# Patient Record
Sex: Male | Born: 1945 | Race: White | Hispanic: No | Smoking: Former smoker
Health system: Southern US, Community
[De-identification: ages and names within clinical notes are randomized; demographics above are authoritative.]

## PROBLEM LIST (undated history)

## (undated) DIAGNOSIS — R079 Chest pain, unspecified: Secondary | ICD-10-CM

## (undated) DIAGNOSIS — K219 Gastro-esophageal reflux disease without esophagitis: Secondary | ICD-10-CM

## (undated) DIAGNOSIS — E785 Hyperlipidemia, unspecified: Secondary | ICD-10-CM

## (undated) DIAGNOSIS — J45909 Unspecified asthma, uncomplicated: Secondary | ICD-10-CM

## (undated) DIAGNOSIS — F32A Depression, unspecified: Secondary | ICD-10-CM

## (undated) DIAGNOSIS — R0602 Shortness of breath: Secondary | ICD-10-CM

## (undated) DIAGNOSIS — R011 Cardiac murmur, unspecified: Secondary | ICD-10-CM

## (undated) DIAGNOSIS — N289 Disorder of kidney and ureter, unspecified: Secondary | ICD-10-CM

## (undated) DIAGNOSIS — I739 Peripheral vascular disease, unspecified: Secondary | ICD-10-CM

## (undated) DIAGNOSIS — I10 Essential (primary) hypertension: Secondary | ICD-10-CM

## (undated) DIAGNOSIS — R609 Edema, unspecified: Secondary | ICD-10-CM

## (undated) DIAGNOSIS — F419 Anxiety disorder, unspecified: Secondary | ICD-10-CM

## (undated) DIAGNOSIS — I499 Cardiac arrhythmia, unspecified: Secondary | ICD-10-CM

## (undated) DIAGNOSIS — I639 Cerebral infarction, unspecified: Secondary | ICD-10-CM

## (undated) DIAGNOSIS — G4730 Sleep apnea, unspecified: Secondary | ICD-10-CM

## (undated) DIAGNOSIS — G629 Polyneuropathy, unspecified: Secondary | ICD-10-CM

## (undated) DIAGNOSIS — I4891 Unspecified atrial fibrillation: Secondary | ICD-10-CM

## (undated) DIAGNOSIS — I509 Heart failure, unspecified: Secondary | ICD-10-CM

## (undated) DIAGNOSIS — R0600 Dyspnea, unspecified: Secondary | ICD-10-CM

## (undated) DIAGNOSIS — F329 Major depressive disorder, single episode, unspecified: Secondary | ICD-10-CM

## (undated) DIAGNOSIS — E669 Obesity, unspecified: Secondary | ICD-10-CM

## (undated) DIAGNOSIS — E039 Hypothyroidism, unspecified: Secondary | ICD-10-CM

## (undated) HISTORY — DX: Chest pain, unspecified: R07.9

## (undated) HISTORY — DX: Hyperlipidemia, unspecified: E78.5

## (undated) HISTORY — DX: Major depressive disorder, single episode, unspecified: F32.9

## (undated) HISTORY — DX: Edema, unspecified: R60.9

## (undated) HISTORY — PX: BACK SURGERY: SHX140

## (undated) HISTORY — PX: LUMBAR FUSION: SHX111

## (undated) HISTORY — PX: EYE SURGERY: SHX253

## (undated) HISTORY — DX: Polyneuropathy, unspecified: G62.9

## (undated) HISTORY — DX: Hypothyroidism, unspecified: E03.9

## (undated) HISTORY — PX: CARDIAC CATHETERIZATION: SHX172

## (undated) HISTORY — DX: Essential (primary) hypertension: I10

## (undated) HISTORY — DX: Depression, unspecified: F32.A

## (undated) HISTORY — DX: Shortness of breath: R06.02

---

## 1997-12-29 ENCOUNTER — Emergency Department (HOSPITAL_COMMUNITY): Admission: EM | Admit: 1997-12-29 | Discharge: 1997-12-29 | Payer: Self-pay | Admitting: Emergency Medicine

## 1998-01-01 ENCOUNTER — Encounter: Admission: RE | Admit: 1998-01-01 | Discharge: 1998-04-01 | Payer: Self-pay | Admitting: Internal Medicine

## 1998-01-03 ENCOUNTER — Encounter: Payer: Self-pay | Admitting: Internal Medicine

## 1998-01-24 ENCOUNTER — Encounter: Payer: Self-pay | Admitting: *Deleted

## 1998-01-24 ENCOUNTER — Ambulatory Visit (HOSPITAL_COMMUNITY): Admission: RE | Admit: 1998-01-24 | Discharge: 1998-01-24 | Payer: Self-pay | Admitting: *Deleted

## 1998-02-07 ENCOUNTER — Encounter: Payer: Self-pay | Admitting: *Deleted

## 1998-02-07 ENCOUNTER — Ambulatory Visit (HOSPITAL_COMMUNITY): Admission: RE | Admit: 1998-02-07 | Discharge: 1998-02-07 | Payer: Self-pay | Admitting: *Deleted

## 1998-02-22 ENCOUNTER — Encounter: Payer: Self-pay | Admitting: *Deleted

## 1998-02-22 ENCOUNTER — Ambulatory Visit (HOSPITAL_COMMUNITY): Admission: RE | Admit: 1998-02-22 | Discharge: 1998-02-22 | Payer: Self-pay | Admitting: *Deleted

## 1999-01-03 ENCOUNTER — Observation Stay (HOSPITAL_COMMUNITY): Admission: EM | Admit: 1999-01-03 | Discharge: 1999-01-04 | Payer: Self-pay | Admitting: Emergency Medicine

## 1999-01-03 ENCOUNTER — Encounter: Payer: Self-pay | Admitting: Emergency Medicine

## 1999-01-21 ENCOUNTER — Encounter: Admission: RE | Admit: 1999-01-21 | Discharge: 1999-01-21 | Payer: Self-pay | Admitting: Family Medicine

## 1999-08-27 ENCOUNTER — Encounter: Payer: Self-pay | Admitting: Emergency Medicine

## 1999-08-27 ENCOUNTER — Emergency Department (HOSPITAL_COMMUNITY): Admission: EM | Admit: 1999-08-27 | Discharge: 1999-08-27 | Payer: Self-pay | Admitting: Emergency Medicine

## 1999-09-20 ENCOUNTER — Encounter: Payer: Self-pay | Admitting: Emergency Medicine

## 1999-09-20 ENCOUNTER — Inpatient Hospital Stay (HOSPITAL_COMMUNITY): Admission: EM | Admit: 1999-09-20 | Discharge: 1999-09-27 | Payer: Self-pay | Admitting: Emergency Medicine

## 1999-09-24 ENCOUNTER — Encounter: Payer: Self-pay | Admitting: Family Medicine

## 2001-06-15 ENCOUNTER — Encounter: Payer: Self-pay | Admitting: Family Medicine

## 2001-06-15 ENCOUNTER — Encounter: Admission: RE | Admit: 2001-06-15 | Discharge: 2001-06-15 | Payer: Self-pay | Admitting: Family Medicine

## 2001-08-09 ENCOUNTER — Ambulatory Visit (HOSPITAL_COMMUNITY): Admission: RE | Admit: 2001-08-09 | Discharge: 2001-08-09 | Payer: Self-pay | Admitting: Gastroenterology

## 2002-11-04 ENCOUNTER — Encounter: Admission: RE | Admit: 2002-11-04 | Discharge: 2002-11-04 | Payer: Self-pay | Admitting: Gastroenterology

## 2002-11-04 ENCOUNTER — Encounter: Payer: Self-pay | Admitting: Gastroenterology

## 2003-03-23 ENCOUNTER — Inpatient Hospital Stay (HOSPITAL_COMMUNITY): Admission: EM | Admit: 2003-03-23 | Discharge: 2003-03-31 | Payer: Self-pay | Admitting: Emergency Medicine

## 2003-04-03 ENCOUNTER — Inpatient Hospital Stay (HOSPITAL_COMMUNITY): Admission: EM | Admit: 2003-04-03 | Discharge: 2003-04-06 | Payer: Self-pay | Admitting: Emergency Medicine

## 2003-04-06 ENCOUNTER — Inpatient Hospital Stay (HOSPITAL_COMMUNITY)
Admission: RE | Admit: 2003-04-06 | Discharge: 2003-04-12 | Payer: Self-pay | Admitting: Physical Medicine & Rehabilitation

## 2003-05-23 ENCOUNTER — Encounter
Admission: RE | Admit: 2003-05-23 | Discharge: 2003-08-21 | Payer: Self-pay | Admitting: Physical Medicine & Rehabilitation

## 2003-06-23 ENCOUNTER — Inpatient Hospital Stay (HOSPITAL_COMMUNITY): Admission: EM | Admit: 2003-06-23 | Discharge: 2003-06-27 | Payer: Self-pay | Admitting: Emergency Medicine

## 2004-02-22 ENCOUNTER — Ambulatory Visit (HOSPITAL_COMMUNITY): Admission: RE | Admit: 2004-02-22 | Discharge: 2004-02-22 | Payer: Self-pay | Admitting: Neurology

## 2004-03-19 ENCOUNTER — Ambulatory Visit: Payer: Self-pay

## 2006-04-12 ENCOUNTER — Ambulatory Visit: Payer: Self-pay | Admitting: Internal Medicine

## 2006-04-12 ENCOUNTER — Inpatient Hospital Stay (HOSPITAL_COMMUNITY): Admission: EM | Admit: 2006-04-12 | Discharge: 2006-04-15 | Payer: Self-pay | Admitting: Emergency Medicine

## 2006-04-12 ENCOUNTER — Ambulatory Visit: Payer: Self-pay | Admitting: Family Medicine

## 2010-04-28 ENCOUNTER — Encounter: Payer: Self-pay | Admitting: Physical Medicine and Rehabilitation

## 2012-03-19 ENCOUNTER — Encounter: Payer: Self-pay | Admitting: Cardiology

## 2012-03-19 ENCOUNTER — Encounter: Payer: Self-pay | Admitting: *Deleted

## 2012-03-22 ENCOUNTER — Encounter: Payer: Self-pay | Admitting: Cardiovascular Disease

## 2012-03-22 ENCOUNTER — Ambulatory Visit (INDEPENDENT_AMBULATORY_CARE_PROVIDER_SITE_OTHER): Payer: Medicare Other | Admitting: Cardiovascular Disease

## 2012-03-22 VITALS — BP 163/76 | HR 72 | Resp 20 | Ht 74.0 in | Wt 367.0 lb

## 2012-03-22 DIAGNOSIS — I359 Nonrheumatic aortic valve disorder, unspecified: Secondary | ICD-10-CM

## 2012-03-22 DIAGNOSIS — R0600 Dyspnea, unspecified: Secondary | ICD-10-CM | POA: Insufficient documentation

## 2012-03-22 DIAGNOSIS — R0789 Other chest pain: Secondary | ICD-10-CM | POA: Insufficient documentation

## 2012-03-22 DIAGNOSIS — Z9889 Other specified postprocedural states: Secondary | ICD-10-CM

## 2012-03-22 DIAGNOSIS — J810 Acute pulmonary edema: Secondary | ICD-10-CM | POA: Insufficient documentation

## 2012-03-22 DIAGNOSIS — R0989 Other specified symptoms and signs involving the circulatory and respiratory systems: Secondary | ICD-10-CM

## 2012-03-22 DIAGNOSIS — I350 Nonrheumatic aortic (valve) stenosis: Secondary | ICD-10-CM

## 2012-03-22 DIAGNOSIS — R0609 Other forms of dyspnea: Secondary | ICD-10-CM

## 2012-03-22 DIAGNOSIS — R079 Chest pain, unspecified: Secondary | ICD-10-CM

## 2012-03-22 NOTE — Assessment & Plan Note (Signed)
Atypica and may be related to AS.  Has had two myovue's in past one with adenosine and one with dobutamine.  One normal and in 2001 ? Mild abnormality which resulted in normal cath.  Would not cath unless AS thought to be severe or clear abnormality  Two day protocol

## 2012-03-22 NOTE — Patient Instructions (Addendum)
Your physician recommends that you schedule a follow-up appointment in: AS NEEDED  Your physician recommends that you continue on your current medications as directed. Please refer to the Current Medication list given to you today. Your physician has requested that you have an echocardiogram. Echocardiography is a painless test that uses sound waves to create images of your heart. It provides your doctor with information about the size and shape of your heart and how well your heart's chambers and valves are working. This procedure takes approximately one hour. There are no restrictions for this procedure.  DX [facility] Your physician has requested that you have a lexiscan myoview. For further information please visit [email or web address] Please follow instruction sheet, as given. DX DYSPNEA  Your physician has requested that you have a carotid duplex. This test is an ultrasound of the carotid arteries in your neck. It looks at blood flow through these arteries that supply the brain with blood. Allow one hour for this exam. There are no restrictions or special instructions. DX   LEFT CEA   BARIATRIC  SURGERY     [phone] [address] Jacky Kindle

## 2012-03-22 NOTE — Progress Notes (Signed)
Patient ID: Jorge [name], male   DOB: 12-Dec-1945, 66 y.o.   MRN: [id number] 66 yo referred by Dr Nathanial Rancher for multiple issues.  First seen by Dr Deborah Chalk in 2001 for chest pain Cath with no CAD.  Seen by Dr Ladona Ridgel and Jens Som in 2004/2005.  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  Last seen by Dr Tenny Craw in 2008.  Dobutamine myovue nonischemic.  Echo EF 65% with mean gradient 22 and peak .  Has had increasing dyspnea and fatigue.  Chronic LE edema on lasix.  Smokes 3-4ppd until 38 and has clinical COPD.  Just started on CPAP a week ago for OSA.  When he gets dyspnic can get pressure in chest.  Activity limited by neuropathy and obesity.  Has gained about 30 lbs this year and is over 360lbs.  Poor diet.  Compliant with meds.  Has had no f/u of AS or CEA since 40981.  History of PAF  Briefly on coumadin but ? Hematoma and at one time listed as allergy.  Also previous heavy ETOH.    ROS: Denies fever, malais, weight loss, blurry vision, decreased visual acuity, cough, sputum, SOB, hemoptysis, pleuritic pain, palpitaitons, heartburn, abdominal pain, melena, lower extremity edema, claudication, or rash.  All other systems reviewed and negative   General: Affect appropriate Obese chroncially ill male HEENT: normal Neck supple with no adenopathy JVP normal left bruit with CEA no thyromegaly Lungs clear with no whbruitseezing and good diaphragmatic motion Heart:  S1/S2 distant but SEM murmur,rub, gallop or click PMI not palpable Abdomen: benighn, BS positve, no tenderness, no AAA no bruit.  No HSM or HJR Distal pulses intact with no bruits Plus 2 bilateral  edema Neuro non-focal Skin warm and dry No muscular weakness  Medications Current Outpatient Prescriptions  Medication Sig Dispense Refill  . ALPRAZolam (XANAX) 0.5 MG tablet Take 0.5 mg by mouth at bedtime as needed.      Marland Kitchen aspirin 81 MG tablet Take 81 mg by mouth daily.      Marland Kitchen buPROPion (WELLBUTRIN SR) 150 MG 12 hr  tablet Take 150 mg by mouth daily.      . furosemide (LASIX) 40 MG tablet Take 40 mg by mouth daily.      Marland Kitchen gabapentin (NEURONTIN) 800 MG tablet Take 800 mg by mouth 2 (two) times daily.      Marland Kitchen HYDROcodone-acetaminophen (VICODIN) 5-500 MG per tablet Take 1 tablet by mouth every 6 (six) hours as needed.      Marland Kitchen lisinopril (PRINIVIL,ZESTRIL) 40 MG tablet Take 40 mg by mouth daily.      . metoprolol (LOPRESSOR) 50 MG tablet 1 1/2 tab po qd      . sertraline (ZOLOFT) 100 MG tablet Take 100 mg by mouth daily.      . simvastatin (ZOCOR) 40 MG tablet Take 40 mg by mouth every evening.        Allergies Review of patient's allergies indicates not on file.  Family History: No family history on file.  Social History: History   Social History  . Marital Status: Divorced    Spouse Name: N/A    Number of Children: N/A  . Years of Education: N/A   Occupational History  . Not on file.   Social History Main Topics  . Smoking status: Former Games developer  . Smokeless tobacco: Former Neurosurgeon    Quit date: 03/22/1996  . Alcohol Use: No  . Drug Use: No  . Sexually Active: Not on  file   Other Topics Concern  . Not on file   Social History Narrative  . No narrative on file    Electrocardiogram:  12/9  SR rate 55 LAD Done at [facility] office  Assessment and Plan

## 2012-03-22 NOTE — Assessment & Plan Note (Signed)
Needs F/U duplex to R/O recurrent disease and had 40% stenosis on Right at time of left CEA

## 2012-03-22 NOTE — Assessment & Plan Note (Signed)
Will need f/u echo. Gradient may be hard to estimate given size.  It would be a real concern if his AS has progressed as he is not an ideal candidate for open surgery.  He indicates having murmur his whole life but no mention of bicuspid valve

## 2012-03-22 NOTE — Assessment & Plan Note (Signed)
Related to COPD, OSA and obesity.  Component of AS to be determined.  Check echo for RV/LV funciton and AS gradients.  Continue CPAP just started

## 2012-03-22 NOTE — Assessment & Plan Note (Signed)
Dependant from obesity Continue current dose of lasix

## 2012-03-29 ENCOUNTER — Encounter: Payer: Self-pay | Admitting: Cardiovascular Disease

## 2012-03-29 ENCOUNTER — Ambulatory Visit (HOSPITAL_COMMUNITY): Payer: Medicare Other | Attending: Cardiovascular Disease | Admitting: Radiology

## 2012-03-29 VITALS — BP 148/79 | Ht 74.0 in | Wt 350.0 lb

## 2012-03-29 DIAGNOSIS — R0989 Other specified symptoms and signs involving the circulatory and respiratory systems: Secondary | ICD-10-CM | POA: Insufficient documentation

## 2012-03-29 DIAGNOSIS — R079 Chest pain, unspecified: Secondary | ICD-10-CM

## 2012-03-29 DIAGNOSIS — R0609 Other forms of dyspnea: Secondary | ICD-10-CM | POA: Insufficient documentation

## 2012-03-29 DIAGNOSIS — R0602 Shortness of breath: Secondary | ICD-10-CM

## 2012-03-29 DIAGNOSIS — R0600 Dyspnea, unspecified: Secondary | ICD-10-CM

## 2012-03-29 MED ORDER — REGADENOSON 0.4 MG/5ML IV SOLN
0.4000 mg | Freq: Once | INTRAVENOUS | Status: AC
  Administered 2012-03-29: 0.4 mg via INTRAVENOUS

## 2012-03-29 MED ORDER — TECHNETIUM TC 99M SESTAMIBI GENERIC - CARDIOLITE
30.0000 | Freq: Once | INTRAVENOUS | Status: AC | PRN
  Administered 2012-03-29: 30 via INTRAVENOUS

## 2012-03-29 NOTE — Progress Notes (Signed)
[facility] SITE 3 NUCLEAR MED [address] [place], [place] [address] [phone]    Cardiology Nuclear Med Study  Jorge [name] is a 66 y.o. male     MRN : [id number]     DOB: 09-05-1945  Procedure Date: 03/29/2012  Nuclear Med Background Indication for Stress Test:  Evaluation for Ischemia History:  No prior known history of CAD, PAF,CHF, '01 Cath: Normal (no report), '08 Echo: EF=65%, ? AS, and '08 MPS:No Ischemia Cardiac Risk Factors: Carotid Disease, CVA, Family History - CAD, History of Smoking, Hypertension, Lipids, NIDDM, Obesity and TIA  Symptoms: Chest Pain/Chest Pressure with/without exertion (last occurrence 3 weeks ago),  Diaphoresis, Dizziness, DOE, Fatigue, Fatigue with Exertion, Light-Headedness, Near Syncope, Rapid HR, SOB and Syncope   Nuclear Pre-Procedure Caffeine/Decaff Intake:  None > 12 hrs NPO After: 6:00pm   Lungs:  clear O2 Sat: 93-96% on room air. IV 0.9% NS with Angio Cath:  22g  IV Site: R Hand x 1, tolerated well IV Started by:  Irean Hong, RN  Chest Size (in):  58 Cup Size: n/a  Height: 6\' 2"  (1.88 m)  Weight:  350 lb (158.759 kg)  BMI:  Body mass index is 44.94 kg/(m^2). Tech Comments:  No medications today    Nuclear Med Study 1 or 2 day study: 2 day  Stress Test Type:  Lexiscan  Reading MD: Charlton Haws MD  Order Authorizing Provider:  Charlton Haws, MD  Resting Radionuclide: Technetium 82m Sestamibi  Resting Radionuclide Dose: 33.0 mCi  04/01/12  Stress Radionuclide:  Technetium 53m Sestamibi  Stress Radionuclide Dose: 33.0 mCi  03/29/12          Stress Protocol Rest HR: 94 Stress HR: 118  Rest BP: 148/79 Stress BP: 142/83  Exercise Time (min): n/a METS: n/a   Predicted Max HR: 154 bpm % Max HR: 76.62 bpm Rate Pressure Product: 46962    Dose of Adenosine (mg):  n/a Dose of Lexiscan: 0.4 mg  Dose of Atropine (mg): n/a Dose of Dobutamine: n/a mcg/kg/min (at max HR)  Stress Test Technologist: Irean Hong, RN   Nuclear Technologist:  Domenic Polite, CNMT     Rest Procedure:  Myocardial perfusion imaging was performed at rest 45 minutes following the intravenous administration of Technetium 44m Sestamibi. Rest ECG: Poor R wave progression NSR  Stress Procedure:  The patient received IV Lexiscan 0.4 mg over 15-seconds. The patient complained of chest tightness, headache,and SOB. Technetium 24m Sestamibi injected at 30-seconds.  Quantitative spect images were obtained after a 45 minute delay. Stress ECG: No significant change from baseline ECG  QPS Raw Data Images:  Patient motion noted. Stress Images:  Normal homogeneous uptake in all areas of the myocardium. Rest Images:  Normal homogeneous uptake in all areas of the myocardium. Subtraction (SDS):  Normal Transient Ischemic Dilatation (Normal <1.22):  0.92 Lung/Heart Ratio (Normal <0.45):  0.39  Quantitative Gated Spect Images QGS EDV:  93 ml QGS ESV:  37 ml  Impression Exercise Capacity:  Lexiscan with no exercise. BP Response:  Normal blood pressure response. Clinical Symptoms:  There is dyspnea. ECG Impression:  No significant ST segment change suggestive of ischemia. Comparison with Prior Nuclear Study: No images to compare  Overall Impression:  Normal stress nuclear study.  LV Ejection Fraction: 61%.  LV Wall Motion:  NL LV Function; NL Wall Motion  Charlton Haws

## 2012-04-01 ENCOUNTER — Ambulatory Visit (HOSPITAL_COMMUNITY): Payer: Medicare Other | Attending: Cardiovascular Disease

## 2012-04-01 ENCOUNTER — Ambulatory Visit (HOSPITAL_COMMUNITY): Payer: Medicare Other | Attending: Cardiovascular Disease | Admitting: Radiology

## 2012-04-01 DIAGNOSIS — I080 Rheumatic disorders of both mitral and aortic valves: Secondary | ICD-10-CM | POA: Insufficient documentation

## 2012-04-01 DIAGNOSIS — I369 Nonrheumatic tricuspid valve disorder, unspecified: Secondary | ICD-10-CM | POA: Insufficient documentation

## 2012-04-01 DIAGNOSIS — I359 Nonrheumatic aortic valve disorder, unspecified: Secondary | ICD-10-CM

## 2012-04-01 DIAGNOSIS — I4891 Unspecified atrial fibrillation: Secondary | ICD-10-CM | POA: Insufficient documentation

## 2012-04-01 DIAGNOSIS — R0989 Other specified symptoms and signs involving the circulatory and respiratory systems: Secondary | ICD-10-CM

## 2012-04-01 DIAGNOSIS — R0600 Dyspnea, unspecified: Secondary | ICD-10-CM

## 2012-04-01 MED ORDER — TECHNETIUM TC 99M SESTAMIBI GENERIC - CARDIOLITE
30.0000 | Freq: Once | INTRAVENOUS | Status: AC | PRN
  Administered 2012-04-01: 30 via INTRAVENOUS

## 2012-04-01 NOTE — Progress Notes (Signed)
Echocardiogram performed.  

## 2012-04-05 ENCOUNTER — Encounter (INDEPENDENT_AMBULATORY_CARE_PROVIDER_SITE_OTHER): Payer: Medicare Other

## 2012-04-05 DIAGNOSIS — Z9889 Other specified postprocedural states: Secondary | ICD-10-CM

## 2012-04-05 DIAGNOSIS — I6529 Occlusion and stenosis of unspecified carotid artery: Secondary | ICD-10-CM

## 2013-09-27 ENCOUNTER — Encounter: Payer: Self-pay | Admitting: Internal Medicine

## 2014-05-24 ENCOUNTER — Encounter (HOSPITAL_COMMUNITY): Payer: Self-pay | Admitting: *Deleted

## 2014-05-24 ENCOUNTER — Observation Stay (HOSPITAL_COMMUNITY)
Admission: EM | Admit: 2014-05-24 | Discharge: 2014-05-31 | Disposition: A | Discharge status: Home / Self Care | Payer: Medicare Other | Attending: Internal Medicine | Admitting: Internal Medicine

## 2014-05-24 ENCOUNTER — Inpatient Hospital Stay (HOSPITAL_COMMUNITY): Payer: Medicare Other

## 2014-05-24 ENCOUNTER — Emergency Department (HOSPITAL_COMMUNITY): Payer: Medicare Other

## 2014-05-24 DIAGNOSIS — R2990 Unspecified symptoms and signs involving the nervous system: Secondary | ICD-10-CM

## 2014-05-24 DIAGNOSIS — F329 Major depressive disorder, single episode, unspecified: Secondary | ICD-10-CM | POA: Insufficient documentation

## 2014-05-24 DIAGNOSIS — I350 Nonrheumatic aortic (valve) stenosis: Secondary | ICD-10-CM | POA: Diagnosis not present

## 2014-05-24 DIAGNOSIS — G459 Transient cerebral ischemic attack, unspecified: Secondary | ICD-10-CM | POA: Diagnosis not present

## 2014-05-24 DIAGNOSIS — I639 Cerebral infarction, unspecified: Secondary | ICD-10-CM

## 2014-05-24 DIAGNOSIS — I6529 Occlusion and stenosis of unspecified carotid artery: Secondary | ICD-10-CM | POA: Insufficient documentation

## 2014-05-24 DIAGNOSIS — G451 Carotid artery syndrome (hemispheric): Secondary | ICD-10-CM | POA: Diagnosis not present

## 2014-05-24 DIAGNOSIS — I6521 Occlusion and stenosis of right carotid artery: Secondary | ICD-10-CM | POA: Diagnosis not present

## 2014-05-24 DIAGNOSIS — E785 Hyperlipidemia, unspecified: Secondary | ICD-10-CM | POA: Insufficient documentation

## 2014-05-24 DIAGNOSIS — R29898 Other symptoms and signs involving the musculoskeletal system: Secondary | ICD-10-CM | POA: Diagnosis not present

## 2014-05-24 DIAGNOSIS — I2510 Atherosclerotic heart disease of native coronary artery without angina pectoris: Secondary | ICD-10-CM | POA: Diagnosis not present

## 2014-05-24 DIAGNOSIS — I480 Paroxysmal atrial fibrillation: Secondary | ICD-10-CM | POA: Insufficient documentation

## 2014-05-24 DIAGNOSIS — I10 Essential (primary) hypertension: Secondary | ICD-10-CM | POA: Diagnosis not present

## 2014-05-24 DIAGNOSIS — I69354 Hemiplegia and hemiparesis following cerebral infarction affecting left non-dominant side: Secondary | ICD-10-CM | POA: Insufficient documentation

## 2014-05-24 DIAGNOSIS — Z7982 Long term (current) use of aspirin: Secondary | ICD-10-CM | POA: Insufficient documentation

## 2014-05-24 DIAGNOSIS — Z87891 Personal history of nicotine dependence: Secondary | ICD-10-CM | POA: Diagnosis not present

## 2014-05-24 DIAGNOSIS — I509 Heart failure, unspecified: Secondary | ICD-10-CM | POA: Insufficient documentation

## 2014-05-24 DIAGNOSIS — R079 Chest pain, unspecified: Secondary | ICD-10-CM

## 2014-05-24 DIAGNOSIS — S299XXA Unspecified injury of thorax, initial encounter: Secondary | ICD-10-CM | POA: Diagnosis not present

## 2014-05-24 DIAGNOSIS — Z9889 Other specified postprocedural states: Secondary | ICD-10-CM

## 2014-05-24 DIAGNOSIS — R4020 Unspecified coma: Secondary | ICD-10-CM | POA: Diagnosis not present

## 2014-05-24 HISTORY — DX: Cerebral infarction, unspecified: I63.9

## 2014-05-24 LAB — I-STAT TROPONIN, ED: Troponin i, poc: 0.01 ng/mL (ref 0.00–0.08)

## 2014-05-24 LAB — COMPREHENSIVE METABOLIC PANEL
ALK PHOS: 90 U/L (ref 39–117)
ALT: 22 U/L (ref 0–53)
AST: 20 U/L (ref 0–37)
Albumin: 4 g/dL (ref 3.5–5.2)
Anion gap: 10 (ref 5–15)
BUN: 23 mg/dL (ref 6–23)
CO2: 25 mmol/L (ref 19–32)
Calcium: 8.8 mg/dL (ref 8.4–10.5)
Chloride: 103 mmol/L (ref 96–112)
Creatinine, Ser: 1.18 mg/dL (ref 0.50–1.35)
GFR, EST AFRICAN AMERICAN: 71 mL/min — AB (ref >90)
GFR, EST NON AFRICAN AMERICAN: 61 mL/min — AB (ref >90)
GLUCOSE: 118 mg/dL — AB (ref 70–99)
POTASSIUM: 3.9 mmol/L (ref 3.5–5.1)
SODIUM: 138 mmol/L (ref 135–145)
Total Bilirubin: 0.6 mg/dL (ref 0.3–1.2)
Total Protein: 7.3 g/dL (ref 6.0–8.3)

## 2014-05-24 LAB — GLUCOSE, CAPILLARY: Glucose-Capillary: 74 mg/dL (ref 70–99)

## 2014-05-24 LAB — DIFFERENTIAL
BASOS ABS: 0 10*3/uL (ref 0.0–0.1)
BASOS PCT: 0 % (ref 0–1)
Eosinophils Absolute: 0.1 10*3/uL (ref 0.0–0.7)
Eosinophils Relative: 2 % (ref 0–5)
Lymphocytes Relative: 20 % (ref 12–46)
Lymphs Abs: 1.4 10*3/uL (ref 0.7–4.0)
Monocytes Absolute: 0.7 10*3/uL (ref 0.1–1.0)
Monocytes Relative: 10 % (ref 3–12)
NEUTROS ABS: 4.6 10*3/uL (ref 1.7–7.7)
Neutrophils Relative %: 68 % (ref 43–77)

## 2014-05-24 LAB — CBC
HCT: 44.4 % (ref 39.0–52.0)
HEMOGLOBIN: 14.8 g/dL (ref 13.0–17.0)
MCH: 30.5 pg (ref 26.0–34.0)
MCHC: 33.3 g/dL (ref 30.0–36.0)
MCV: 91.5 fL (ref 78.0–100.0)
Platelets: 202 10*3/uL (ref 150–400)
RBC: 4.85 MIL/uL (ref 4.22–5.81)
RDW: 14.2 % (ref 11.5–15.5)
WBC: 6.8 10*3/uL (ref 4.0–10.5)

## 2014-05-24 LAB — I-STAT CHEM 8, ED
BUN: 26 mg/dL — ABNORMAL HIGH (ref 6–23)
CREATININE: 1.1 mg/dL (ref 0.50–1.35)
Calcium, Ion: 1.05 mmol/L — ABNORMAL LOW (ref 1.13–1.30)
Chloride: 101 mmol/L (ref 96–112)
Glucose, Bld: 122 mg/dL — ABNORMAL HIGH (ref 70–99)
HEMATOCRIT: 49 % (ref 39.0–52.0)
HEMOGLOBIN: 16.7 g/dL (ref 13.0–17.0)
Potassium: 3.9 mmol/L (ref 3.5–5.1)
SODIUM: 139 mmol/L (ref 135–145)
TCO2: 22 mmol/L (ref 0–100)

## 2014-05-24 LAB — PROTIME-INR
INR: 1.04 (ref 0.00–1.49)
PROTHROMBIN TIME: 13.7 s (ref 11.6–15.2)

## 2014-05-24 LAB — CBG MONITORING, ED: Glucose-Capillary: 107 mg/dL — ABNORMAL HIGH (ref 70–99)

## 2014-05-24 LAB — APTT: APTT: 37 s (ref 24–37)

## 2014-05-24 MED ORDER — SENNOSIDES-DOCUSATE SODIUM 8.6-50 MG PO TABS
1.0000 | ORAL_TABLET | Freq: Every evening | ORAL | Status: DC | PRN
  Filled 2014-05-24: qty 1

## 2014-05-24 MED ORDER — SODIUM CHLORIDE 0.9 % IV SOLN
INTRAVENOUS | Status: DC
  Administered 2014-05-24 – 2014-05-30 (×2): via INTRAVENOUS

## 2014-05-24 MED ORDER — HEPARIN SODIUM (PORCINE) 5000 UNIT/ML IJ SOLN
5000.0000 [IU] | Freq: Three times a day (TID) | INTRAMUSCULAR | Status: DC
  Administered 2014-05-24 – 2014-05-30 (×17): 5000 [IU] via SUBCUTANEOUS
  Filled 2014-05-24 (×16): qty 1

## 2014-05-24 MED ORDER — ASPIRIN 325 MG PO TABS
325.0000 mg | ORAL_TABLET | Freq: Once | ORAL | Status: AC
  Administered 2014-05-24: 325 mg via ORAL
  Filled 2014-05-24: qty 1

## 2014-05-24 MED ORDER — STROKE: EARLY STAGES OF RECOVERY BOOK
Freq: Once | Status: AC
  Administered 2014-05-24: 21:00:00

## 2014-05-24 MED ORDER — SIMVASTATIN 40 MG PO TABS
40.0000 mg | ORAL_TABLET | Freq: Every evening | ORAL | Status: DC
  Administered 2014-05-24 – 2014-05-25 (×2): 40 mg via ORAL
  Filled 2014-05-24 (×2): qty 1

## 2014-05-24 MED ORDER — ACETAMINOPHEN 325 MG PO TABS
650.0000 mg | ORAL_TABLET | Freq: Four times a day (QID) | ORAL | Status: DC | PRN
  Administered 2014-05-25 – 2014-05-27 (×6): 650 mg via ORAL
  Filled 2014-05-24 (×6): qty 2

## 2014-05-24 MED ORDER — ASPIRIN 325 MG PO TABS
325.0000 mg | ORAL_TABLET | Freq: Every day | ORAL | Status: DC
  Administered 2014-05-25 – 2014-05-31 (×6): 325 mg via ORAL
  Filled 2014-05-24 (×6): qty 1

## 2014-05-24 NOTE — Consult Note (Signed)
Referring Physician: Rubin PayorPickering    Chief Complaint: Code stroke  HPI:                                                                                                                                         Jorge [name] is an 69 y.o. male who stated he suffered a stroke two years ago.  In the chart there is record he is allergic to coumadin however patient has no recollection of being on coumadin or what the reaction may have been. Today he awoke at 0800 hours and was feeling a little off balance but otherwise normal.  At 1000 he went out to walk his dog.  At 1045 he sat down due to feeling dizzy and then feels he may have passed out.  Upon waking he noted he had left sided weakness and numbness.  He also states three days ago he was walking out of a [place] restaurant when his left leg became very weak, but then resolved.  Currently he still feels his left arm and leg are heavy. NIHSS 3. CT scan of his head showed no acute intracranial abnormality. Patient was admitted for evaluation to rule out possible acute stroke.  Date last known well: Date: 05/24/2014 Time last known well: Time: 10:45 tPA Given: No: minimal symptoms  Past Medical History  Diagnosis Date  . HTN (hypertension)   . Hyperlipidemia   . Edema   . Chest pain   . SOB (shortness of breath)   . Neuropathy     Past Surgical History  Procedure Laterality Date  . Cardiac catheterization      Family history: Patient's mother died from heart disease. The cause of his mother death is unclear. Family history is otherwise unremarkable.  Social History:  reports that he has quit smoking. He quit smokeless tobacco use about 18 years ago. He reports that he does not drink alcohol or use illicit drugs.  Allergies:  Allergies  Allergen Reactions  . Coumadin [Warfarin Sodium] Rash    Medications:                                                                                                                           No  current facility-administered medications for this encounter.   Current Outpatient Prescriptions  Medication Sig Dispense Refill  . ALPRAZolam (XANAX) 0.5 MG tablet Take 0.5  mg by mouth at bedtime as needed.    Marland Kitchen aspirin 81 MG tablet Take 81 mg by mouth daily.    Marland Kitchen buPROPion (WELLBUTRIN SR) 150 MG 12 hr tablet Take 150 mg by mouth daily.    . furosemide (LASIX) 40 MG tablet Take 40 mg by mouth daily.    Marland Kitchen gabapentin (NEURONTIN) 800 MG tablet Take 800 mg by mouth 2 (two) times daily.    Marland Kitchen HYDROcodone-acetaminophen (VICODIN) 5-500 MG per tablet Take 1 tablet by mouth every 6 (six) hours as needed.    Marland Kitchen lisinopril (PRINIVIL,ZESTRIL) 40 MG tablet Take 40 mg by mouth daily.    . metoprolol (LOPRESSOR) 50 MG tablet 1 1/2 tab po qd    . sertraline (ZOLOFT) 100 MG tablet Take 100 mg by mouth daily.    . simvastatin (ZOCOR) 40 MG tablet Take 40 mg by mouth every evening.       ROS:                                                                                                                                       History obtained from the patient  General ROS: negative for - chills, fatigue, fever, night sweats, weight gain or weight loss Psychological ROS: negative for - behavioral disorder, hallucinations, memory difficulties, mood swings or suicidal ideation Ophthalmic ROS: negative for - blurry vision, double vision, eye pain or loss of vision ENT ROS: negative for - epistaxis, nasal discharge, oral lesions, sore throat, tinnitus or vertigo Allergy and Immunology ROS: negative for - hives or itchy/watery eyes Hematological and Lymphatic ROS: negative for - bleeding problems, bruising or swollen lymph nodes Endocrine ROS: negative for - galactorrhea, hair pattern changes, polydipsia/polyuria or temperature intolerance Respiratory ROS: negative for - cough, hemoptysis, shortness of breath or wheezing Cardiovascular ROS: negative for - chest pain, dyspnea on exertion, edema or irregular  heartbeat Gastrointestinal ROS: negative for - abdominal pain, diarrhea, hematemesis, nausea/vomiting or stool incontinence Genito-Urinary ROS: negative for - dysuria, hematuria, incontinence or urinary frequency/urgency Musculoskeletal ROS: negative for - joint swelling or muscular weakness Neurological ROS: as noted in HPI Dermatological ROS: negative for rash and skin lesion changes  Neurologic Examination:                                                                                                      There were no vitals taken for this visit.  HEENT-  Normocephalic, no lesions, without obvious abnormality.  Normal external eye and  conjunctiva.  Normal TM's bilaterally.  Normal auditory canals and external ears. Normal external nose, mucus membranes and septum.  Normal pharynx. Cardiovascular- S1, S2 normal, pulses palpable throughout   Lungs- chest clear, no wheezing, rales, normal symmetric air entry Abdomen- normal findings: bowel sounds normal Extremities- no edema Lymph-no adenopathy palpable Musculoskeletal-no joint tenderness, deformity or swelling Skin-warm and dry, no hyperpigmentation, vitiligo, or suspicious lesions  Neurological Examination Mental Status: Alert, oriented, thought content appropriate.  Speech fluent without evidence of aphasia.  Able to follow 3 step commands without difficulty. Cranial Nerves: II: Discs flat bilaterally; Visual fields grossly normal, right pupil round and reactive to light , left pupil is asymmetrical secondary to injury and not reactive.  III,IV, VI: ptosis not present, extra-ocular motions intact bilaterally V,VII: smile symmetric, facial light touch sensation decreased on the left VIII: hearing normal bilaterally IX,X: gag reflex present XI: bilateral shoulder shrug XII: midline tongue extension Motor: Right : Upper extremity   5/5    Left:     Upper extremity   4/5  Lower extremity   5/5     Lower extremity   4/5 Tone and  bulk:normal tone throughout; no atrophy noted Sensory: Pinprick and light touch decreased on the left Deep Tendon Reflexes: 2+ and symmetric throughout Plantars: Right: downgoing   Left: downgoing Cerebellar: normal finger-to-nose, normal heel-to-shin test with right leg and unable to due on the left leg.  Gait: not tested due to safety and multiple leads    Lab Results: Basic Metabolic Panel:  Recent Labs Lab 05/24/14 1205  NA 139  K 3.9  CL 101  GLUCOSE 122*  BUN 26*  CREATININE 1.10    Liver Function Tests: No results for input(s): AST, ALT, ALKPHOS, BILITOT, PROT, ALBUMIN in the last 168 hours. No results for input(s): LIPASE, AMYLASE in the last 168 hours. No results for input(s): AMMONIA in the last 168 hours.  CBC:  Recent Labs Lab 05/24/14 1205  HGB 16.7  HCT 49.0    Cardiac Enzymes: No results for input(s): CKTOTAL, CKMB, CKMBINDEX, TROPONINI in the last 168 hours.  Lipid Panel: No results for input(s): CHOL, TRIG, HDL, CHOLHDL, VLDL, LDLCALC in the last 168 hours.  CBG:  Recent Labs Lab 05/24/14 1200  GLUCAP 107*    Microbiology: No results found for this or any previous visit.  Coagulation Studies: No results for input(s): LABPROT, INR in the last 72 hours.  Imaging: Ct Head (brain) Wo Contrast  05/24/2014   CLINICAL DATA:  Code stroke with left arm weakness.  EXAM: CT HEAD WITHOUT CONTRAST  TECHNIQUE: Contiguous axial images were obtained from the base of the skull through the vertex without intravenous contrast.  COMPARISON:  04/28/2008  FINDINGS: Skull and Sinuses:Negative for fracture or destructive process. The mastoids, middle ears, and imaged paranasal sinuses are clear.  Orbits: No acute abnormality.  Brain: No evidence of acute infarction, hemorrhage, hydrocephalus, or mass lesion/mass effect.  There is mild patchy bilateral cerebral white matter low density, likely chronic small vessel disease. There has been no significant progression  from 2010. No asymmetric density of the central vessels.  These results were called by telephone at the time of interpretation on 05/24/2014 at 12:17 pm to Dr. Benjiman Core , who verbally acknowledged these results.  IMPRESSION: 1. No evidence of acute intracranial disease. 2. Mild chronic small vessel ischemia.   Electronically Signed   By: Marnee Spring M.D.   On: 05/24/2014 12:18    Felicie Morn PA-C [facility] [phone]  05/24/2014, 12:28 PM   Assessment: 69 y.o. male with multiple risk factors for stroke, presenting with possible right subcortical acute cerebral infarction.  Stroke Risk Factors - hyperlipidemia and hypertension  1. HgbA1c, fasting lipid panel 2. MRI, MRA  of the brain without contrast 3. PT consult, OT consult, Speech consult 4. Echocardiogram 5. Carotid dopplers 6. Prophylactic therapy-Antiplatelet med: Aspirin - dose 325 mg daily 7. Risk factor modification 8. Telemetry monitoring 9. Frequent neuro checks 10 NPO until passes stroke swallow screen  I personally participated in this patient's evaluation and management, including formulating the above clinical assessment and management recommendations.  Venetia Maxon M.D. [facility] [phone]

## 2014-05-24 NOTE — Progress Notes (Signed)
Pt admitted to 4N15;  Alert and oriented; VSS; C/O slight legs; pt reports chronic pain.  Placed on telemetry and CCMD called.  Call bell within reach.  Oriented pt to the room and to call to get up.  He understands.  Will continue to monitor.

## 2014-05-24 NOTE — ED Provider Notes (Signed)
CSN: [id number]     Arrival date & time 05/24/14  1156 History   First MD Initiated Contact with Patient 05/24/14 1211     Chief Complaint  Patient presents with  . Code Stroke     (Consider location/radiation/quality/duration/timing/severity/associated sxs/prior Treatment) The history is provided by the patient and the EMS personnel.   patient presents as a code stroke. Met immediately by myself and neurology at the bridge. At 1045 had onset of left-sided weakness. Some difficulty walking. Some dizziness. Poorly had a stroke around 8 or 9 months ago. He had some weakness on the right side with that. States he has had some memory impairment since then. No headache. No chest pain. No Trouble breathing. He is on a small aspirin a day.  Past Medical History  Diagnosis Date  . HTN (hypertension)   . Hyperlipidemia   . Edema   . Chest pain   . SOB (shortness of breath)   . Neuropathy   . Stroke    Past Surgical History  Procedure Laterality Date  . Cardiac catheterization    . Back surgery     No family history on file. History  Substance Use Topics  . Smoking status: Former Research scientist (life sciences)  . Smokeless tobacco: Former Systems developer    Quit date: 03/22/1996  . Alcohol Use: No    Review of Systems  Constitutional: Negative for diaphoresis.  Respiratory: Negative for shortness of breath.   Cardiovascular: Negative for chest pain.  Gastrointestinal: Negative for abdominal pain.  Genitourinary: Negative for flank pain.  Musculoskeletal: Positive for gait problem.      Allergies  Coumadin  Home Medications   Prior to Admission medications   Medication Sig Start Date End Date Taking? Authorizing Provider  acetaminophen (TYLENOL) 500 MG tablet Take 1,000 mg by mouth every 6 (six) hours as needed for headache (pain).   Yes Historical Provider, MD  ALPRAZolam Duanne Moron) 0.5 MG tablet Take 0.5 mg by mouth at bedtime as needed for sleep.    Yes Historical Provider, MD  aspirin 81 MG tablet Take  81 mg by mouth daily.   Yes Historical Provider, MD  buPROPion (WELLBUTRIN SR) 150 MG 12 hr tablet Take 150 mg by mouth daily.   Yes Historical Provider, MD  furosemide (LASIX) 40 MG tablet Take 40 mg by mouth daily.   Yes Historical Provider, MD  gabapentin (NEURONTIN) 800 MG tablet Take 800 mg by mouth 2 (two) times daily.   Yes Historical Provider, MD  lisinopril (PRINIVIL,ZESTRIL) 40 MG tablet Take 40 mg by mouth daily.   Yes Historical Provider, MD  metoprolol (LOPRESSOR) 50 MG tablet Take 75 mg by mouth daily. 1 1/2 tab po qd   Yes Historical Provider, MD  sertraline (ZOLOFT) 100 MG tablet Take 100 mg by mouth daily.   Yes Historical Provider, MD  simvastatin (ZOCOR) 40 MG tablet Take 40 mg by mouth every evening.   Yes Historical Provider, MD   BP 156/61 mmHg  Pulse 80  Temp(Src) 98.6 F (37 C) (Oral)  Resp 18  Ht 6' (1.829 m)  Wt 347 lb 3.6 oz (157.5 kg)  BMI 47.08 kg/m2  SpO2 94% Physical Exam  Constitutional: He is oriented to person, place, and time. He appears well-developed and well-nourished.  HENT:  Head: Atraumatic.  Eyes: EOM are normal. Pupils are equal, round, and reactive to light.  Cardiovascular: Normal rate and regular rhythm.   Pulmonary/Chest: Effort normal.  Abdominal: Soft. There is no tenderness.  Neurological: He is  alert and oriented to person, place, and time.  Slightly decreased strength on left compared to right. Complete NIH score done by neurology.  Skin: Skin is warm.    ED Course  Procedures (including critical care time) Labs Review Labs Reviewed  COMPREHENSIVE METABOLIC PANEL - Abnormal; Notable for the following:    Glucose, Bld 118 (*)    GFR calc non Af Amer 61 (*)    GFR calc Af Amer 71 (*)    All other components within normal limits  LIPID PANEL - Abnormal; Notable for the following:    Triglycerides 169 (*)    HDL 33 (*)    All other components within normal limits  COMPREHENSIVE METABOLIC PANEL - Abnormal; Notable for the  following:    Glucose, Bld 174 (*)    GFR calc non Af Amer 82 (*)    All other components within normal limits  TROPONIN I - Abnormal; Notable for the following:    Troponin I 0.04 (*)    All other components within normal limits  GLUCOSE, CAPILLARY - Abnormal; Notable for the following:    Glucose-Capillary 107 (*)    All other components within normal limits  GLUCOSE, CAPILLARY - Abnormal; Notable for the following:    Glucose-Capillary 146 (*)    All other components within normal limits  CBG MONITORING, ED - Abnormal; Notable for the following:    Glucose-Capillary 107 (*)    All other components within normal limits  I-STAT CHEM 8, ED - Abnormal; Notable for the following:    BUN 26 (*)    Glucose, Bld 122 (*)    Calcium, Ion 1.05 (*)    All other components within normal limits  PROTIME-INR  APTT  CBC  DIFFERENTIAL  GLUCOSE, CAPILLARY  TROPONIN I  HEMOGLOBIN A1C  TROPONIN I  Randolm Idol, ED    Imaging Review Dg Chest 2 View  05/24/2014   CLINICAL DATA:  Stroke-like symptoms.  The patient fell today.  EXAM: CHEST  2 VIEW  COMPARISON:  05/06/2008 and 04/28/2008  FINDINGS: Heart size and pulmonary vascularity are normal and the lungs are clear. No osseous abnormality.  IMPRESSION: No active cardiopulmonary disease.   Electronically Signed   By: Lorriane Shire M.D.   On: 05/24/2014 20:45   Ct Head (brain) Wo Contrast  05/24/2014   CLINICAL DATA:  Code stroke with left arm weakness.  EXAM: CT HEAD WITHOUT CONTRAST  TECHNIQUE: Contiguous axial images were obtained from the base of the skull through the vertex without intravenous contrast.  COMPARISON:  04/28/2008  FINDINGS: Skull and Sinuses:Negative for fracture or destructive process. The mastoids, middle ears, and imaged paranasal sinuses are clear.  Orbits: No acute abnormality.  Brain: No evidence of acute infarction, hemorrhage, hydrocephalus, or mass lesion/mass effect.  There is mild patchy bilateral cerebral white  matter low density, likely chronic small vessel disease. There has been no significant progression from 2010. No asymmetric density of the central vessels.  These results were called by telephone at the time of interpretation on 05/24/2014 at 12:17 pm to Dr. Davonna Belling , who verbally acknowledged these results.  IMPRESSION: 1. No evidence of acute intracranial disease. 2. Mild chronic small vessel ischemia.   Electronically Signed   By: Monte Fantasia M.D.   On: 05/24/2014 12:18   Mr Brain Wo Contrast  05/24/2014   CLINICAL DATA:  69 year old male with left side weakness and loss of consciousness. Initial encounter.  EXAM: MRI HEAD WITHOUT CONTRAST  MRA HEAD WITHOUT CONTRAST  TECHNIQUE: Multiplanar, multiecho pulse sequences of the brain and surrounding structures were obtained without intravenous contrast. Angiographic images of the head were obtained using MRA technique without contrast.  COMPARISON:  Head CT without contrast 1212 hr today and earlier.  FINDINGS: MRI HEAD FINDINGS  Cerebral volume is within normal limits for age. No restricted diffusion to suggest acute infarction. No midline shift, mass effect, evidence of mass lesion, ventriculomegaly, extra-axial collection or acute intracranial hemorrhage. Cervicomedullary junction and pituitary are within normal limits. Negative visualized cervical spine. Major intracranial vascular flow voids are preserved.  Scattered mild to moderate for age nonspecific cerebral white matter T2 and FLAIR hyperintensity. No cortical encephalomalacia identified. Deep gray matter nuclei are within normal limits. Mild to moderate patchy T2 hyperintensity in the pons. Cerebellum within normal limits.  Visible internal auditory structures appear normal. Visualized paranasal sinuses and mastoids are clear. Postoperative changes to the left globe. Negative right orbit soft tissues. Visualized scalp soft tissues are within normal limits. Normal bone marrow signal.  MRA HEAD  FINDINGS  Antegrade flow in the posterior circulation. Dominant appearing distal left vertebral artery. Both PICA origins are patent. Patent basilar artery without stenosis. Fetal type right PCA origin. SCA and left PCA origins are within normal limits. PCA branches are within normal limits. Left posterior communicating artery is diminutive or absent.  Antegrade flow in both ICA siphons. No siphon stenosis. Ophthalmic and right posterior communicating artery origins are normal. Normal carotid termini, MCA and ACA origins. Anterior communicating artery and visualized ACA branches are within normal limits. Visualized bilateral MCA branches are within normal limits.  IMPRESSION: 1. No acute intracranial abnormality. Mild to moderate for age nonspecific signal changes in the brain, most commonly due to chronic small vessel disease. 2.  Negative intracranial MRA.   Electronically Signed   By: Genevie Ann M.D.   On: 05/24/2014 18:19   Mr Jodene Nam Head/brain Wo Cm  05/24/2014   CLINICAL DATA:  69 year old male with left side weakness and loss of consciousness. Initial encounter.  EXAM: MRI HEAD WITHOUT CONTRAST  MRA HEAD WITHOUT CONTRAST  TECHNIQUE: Multiplanar, multiecho pulse sequences of the brain and surrounding structures were obtained without intravenous contrast. Angiographic images of the head were obtained using MRA technique without contrast.  COMPARISON:  Head CT without contrast 1212 hr today and earlier.  FINDINGS: MRI HEAD FINDINGS  Cerebral volume is within normal limits for age. No restricted diffusion to suggest acute infarction. No midline shift, mass effect, evidence of mass lesion, ventriculomegaly, extra-axial collection or acute intracranial hemorrhage. Cervicomedullary junction and pituitary are within normal limits. Negative visualized cervical spine. Major intracranial vascular flow voids are preserved.  Scattered mild to moderate for age nonspecific cerebral white matter T2 and FLAIR hyperintensity. No  cortical encephalomalacia identified. Deep gray matter nuclei are within normal limits. Mild to moderate patchy T2 hyperintensity in the pons. Cerebellum within normal limits.  Visible internal auditory structures appear normal. Visualized paranasal sinuses and mastoids are clear. Postoperative changes to the left globe. Negative right orbit soft tissues. Visualized scalp soft tissues are within normal limits. Normal bone marrow signal.  MRA HEAD FINDINGS  Antegrade flow in the posterior circulation. Dominant appearing distal left vertebral artery. Both PICA origins are patent. Patent basilar artery without stenosis. Fetal type right PCA origin. SCA and left PCA origins are within normal limits. PCA branches are within normal limits. Left posterior communicating artery is diminutive or absent.  Antegrade flow in both ICA siphons.  No siphon stenosis. Ophthalmic and right posterior communicating artery origins are normal. Normal carotid termini, MCA and ACA origins. Anterior communicating artery and visualized ACA branches are within normal limits. Visualized bilateral MCA branches are within normal limits.  IMPRESSION: 1. No acute intracranial abnormality. Mild to moderate for age nonspecific signal changes in the brain, most commonly due to chronic small vessel disease. 2.  Negative intracranial MRA.   Electronically Signed   By: Genevie Ann M.D.   On: 05/24/2014 18:19     EKG Interpretation   Date/Time:  Wednesday May 24 2014 12:16:54 EST Ventricular Rate:  82 PR Interval:  181 QRS Duration: 91 QT Interval:  390 QTC Calculation: 455 R Axis:   -6 Text Interpretation:  Sinus rhythm Low voltage, precordial leads  Borderline repolarization abnormality Confirmed by Alvino Chapel  MD, Ovid Curd  [phone]) on 05/25/2014 4:21:09 PM      MDM   Final diagnoses:  Stroke    Patient with weakness. Not a TPA candidate due to improvement of symptoms, and the NIH score. Will admit to internal medicine.   Jasper Riling. Alvino Chapel, MD 05/25/14 1623

## 2014-05-24 NOTE — H&P (Signed)
Date: 05/24/2014               Patient Name:  Jorge [name] MRN: [id number]  DOB: Dec 13, 1945 Age / Sex: 69 y.o., male   PCP: Ailene Ravel, MD         Medical Service: Internal Medicine Teaching Service         Attending Physician: Dr. Aletta Edouard, MD    First Contact: Dr. Farley Ly Pager: [phone]  Second Contact: Dr. 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 He said he woke up this morning at about 8 am feeling at baseline except for mild headache. He went to walk his dog at about 10:45 am when he felt very dizzy and "blacked out" on the grass of his yard. He was unsure if he hit his head. No post-ictal state, loss of bowel or bladder consciousness. He said he has since felt increased heaviness of his L arm and leg with paresthesia. He says he has baseline L sided weakness since his cerebrovascular event about two years ago which was complicated by seizures and required a left carotid endarterectomy. He also notes that about three days ago he had trouble lifting his left leg out of his car but that resolved shortly after. He otherwise denies any chest pain, SOB, nausea, emesis, diarrhea, constipation, fevers, chills, night sweats, vision changes.  In the ED, he was seen by neurology with NIHSS 3 and no tPA given due to minimal symptoms.   Meds: No current facility-administered medications for this encounter.   Current Outpatient Prescriptions  Medication Sig Dispense Refill  . acetaminophen (TYLENOL) 500 MG tablet Take 1,000 mg by mouth every 6 (six) hours as needed for headache (pain).    Marland Kitchen ALPRAZolam (XANAX) 0.5 MG  tablet Take 0.5 mg by mouth at bedtime as needed for sleep.     Marland Kitchen aspirin 81 MG tablet Take 81 mg by mouth daily.    Marland Kitchen buPROPion (WELLBUTRIN SR) 150 MG 12 hr tablet Take 150 mg by mouth daily.    . furosemide (LASIX) 40 MG tablet Take 40 mg by mouth daily.    Marland Kitchen gabapentin (NEURONTIN) 800 MG tablet Take 800 mg by mouth 2 (two) times daily.    Marland Kitchen lisinopril (PRINIVIL,ZESTRIL) 40 MG tablet Take 40 mg by mouth daily.    . metoprolol (LOPRESSOR) 50 MG tablet Take 75 mg by mouth daily. 1 1/2 tab po qd    . sertraline (ZOLOFT) 100 MG tablet Take 100 mg by mouth daily.    . simvastatin (ZOCOR) 40 MG tablet Take 40 mg by mouth every evening.      Allergies: Allergies as of 05/24/2014 - Review Complete 05/24/2014  Allergen Reaction Noted  . Coumadin [warfarin sodium] Rash 03/29/2012   Past Medical History  Diagnosis Date  . HTN (hypertension)   . Hyperlipidemia   . Edema   . Chest pain   . SOB (shortness of breath)   . Neuropathy   . Stroke    Past Surgical History  Procedure Laterality Date  . Cardiac catheterization    . Back surgery  No family history on file. History   Social History  . Marital Status: Divorced    Spouse Name: N/A  . Number of Children: N/A  . Years of Education: N/A   Occupational History  . Not on file.   Social History Main Topics  . Smoking status: Former Games developer  . Smokeless tobacco: Former Neurosurgeon    Quit date: 03/22/1996  . Alcohol Use: No  . Drug Use: No  . Sexual Activity: Not on file   Other Topics Concern  . Not on file   Social History Narrative    Review of Systems: A comprehensive review of systems was negative except for: as noted above per HPI  Physical Exam: Blood pressure 140/62, pulse 76, temperature 98.6 F (37 C), temperature source Oral, resp. rate 13, height 6' (1.829 m), weight 347 lb 3.6 oz (157.5 kg), SpO2 98 %.  Gen: A&O x 4, no acute distress, obese HEENT: Atraumatic, PERRL, EOMI, sclerae anicteric, moist mucous  membranes Neck: No carotid bruits or JVD, L carotid endarterectomy scar Heart: Regular rate and rhythm, normal S1 S2, no murmurs, rubs, or gallops Lungs: Clear to auscultation bilaterally, respirations unlabored Abd: Soft, non-tender, non-distended, + bowel sounds, no hepatosplenomegaly Ext: B/l trace pedal edema Neuro: A&O x 4, decreased sensation on L V1, V2, V3 but otherwise CN intact, b/l intention tremor on finger-nose-finger, strength 5/5 in RU and RLE but 4/5 in LU and LLE, sensation grossly intact, no Babinski  Lab results: Basic Metabolic Panel:  Recent Labs  96/04/54 1159 05/24/14 1205  NA 138 139  K 3.9 3.9  CL 103 101  CO2 25  --   GLUCOSE 118* 122*  BUN 23 26*  CREATININE 1.18 1.10  CALCIUM 8.8  --    Liver Function Tests:  Recent Labs  05/24/14 1159  AST 20  ALT 22  ALKPHOS 90  BILITOT 0.6  PROT 7.3  ALBUMIN 4.0   CBC:  Recent Labs  05/24/14 1159 05/24/14 1205  WBC 6.8  --   NEUTROABS 4.6  --   HGB 14.8 16.7  HCT 44.4 49.0  MCV 91.5  --   PLT 202  --    CBG:  Recent Labs  05/24/14 1200  GLUCAP 107*   Coagulation:  Recent Labs  05/24/14 1159  LABPROT 13.7  INR 1.04    Imaging results:  Ct Head (brain) Wo Contrast  05/24/2014   CLINICAL DATA:  Code stroke with left arm weakness.  EXAM: CT HEAD WITHOUT CONTRAST  TECHNIQUE: Contiguous axial images were obtained from the base of the skull through the vertex without intravenous contrast.  COMPARISON:  04/28/2008  FINDINGS: Skull and Sinuses:Negative for fracture or destructive process. The mastoids, middle ears, and imaged paranasal sinuses are clear.  Orbits: No acute abnormality.  Brain: No evidence of acute infarction, hemorrhage, hydrocephalus, or mass lesion/mass effect.  There is mild patchy bilateral cerebral white matter low density, likely chronic small vessel disease. There has been no significant progression from 2010. No asymmetric density of the central vessels.  These results  were called by telephone at the time of interpretation on 05/24/2014 at 12:17 pm to Dr. Benjiman Core , who verbally acknowledged these results.  IMPRESSION: 1. No evidence of acute intracranial disease. 2. Mild chronic small vessel ischemia.   Electronically Signed   By: Marnee Spring M.D.   On: 05/24/2014 12:18    Other results: EKG: NSR, no t-wave or ST changes compared to prior 03/15/12  Assessment &  Plan by Problem: Active Problems:   Stroke-like symptoms   #Stroke like symptoms: Jorge [name] notes decreased left sided strength following loss of consciousness this morning. Exam notable for decreased strength on L as well as sensation on L V1,V2, and V3. Head CT negative. EKG unchanged with i-stat troponin 0.01. 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 He had likely CVA in 2013 (although EMR calls it TIA on previous cardiology visit). He then underwent L carotid endarterectomy later that year. Did not appreciate carotid bruit on exam. He was seen by neuro in the ED and is to be admitted for stroke work-up. -appreciate neuro -MRI/MRA head brain wo contrast -2d echo w contrast -carotid dopplers -check hemoglobin A1c, lipid panel -hold antihypertensives for permissive hypertension -ASA 325 mg po daily -NPO until pass swallow screen -PT/OT/SLP -neuro checks q2h -cardiac monitoring  #HTN: At home he is on ASA 81 mg daily, lasix 40 mg daily,lopressor 75 mg daily, simvastatin 40 mg qhs. -ASA 325 mg daily -hold antihypertensives in setting of permissive hypertension  #Chronic CHF: Non-contributory. Trace pedal edema b/l on exam but lungs clear. Last echo 03/2012 with EF 55-65% and grade 1 diastolic dysfunction. At home he is on ASA 81 mg daily, lasix 40 mg daily,lopressor 75 mg daily, simvastatin 40 mg qhs. -CXR -hold antihypertensives in setting of permissive  hypertension  #Paroxysmal atrial fibrillation: Currently in NSR. Rate controlled on lopressor 75 mg daily(?). Per cardiology note 03/22/2012 he was previously on coumadin but this was stopped and listed as an allergy as he had a hematoma (on cards note) vs rash (on allergy list). CHADS2VASC score is 5 which is 7.2% risk per year of stroke. He is unsure why this was stopped. -cardiac monitoring -consider anti-coagulation given CHADS2VASC score but also question of history of hematoma and he is a fall risk -hold lopressor in setting of permissive HTN  #HL: No lipid panel in EMR. At home he is on simvastatin 40 mg qhs -lipid panel -consider more intense statin therapy  #Depression: At home he takes sertraline 100 mg daily -can restart after passing swallow screen  #Diet: NPO pending swallow screen  #DVT PPx: heparin 5000 u [place] tid  #Code: full  Dispo: Disposition is deferred at this time, awaiting improvement of current medical problems. Anticipated discharge in approximately 1-2 day(s).   The patient does have a current PCP (Ailene RavelMaura L Hamrick, MD) and [facility] after discharge.  The patient does not know have transportation limitations that hinder transportation to clinic appointments.  Signed: Lorenda HatchetAdam L Jacci Ruberg, MD 05/24/2014, 3:06 PM

## 2014-05-24 NOTE — ED Notes (Signed)
Per [facility] pt was walking his dog at 10:30am.  Jorge [name] last seen him well at 10:45am.  Jorge [name] stated that he had a sudden onset of dizziness and fell, but no LOC.  He stated that he had L side weakness and loss of sensation on his Left side. EMS stated that his Left arm has a droop but doesn't fall.  He has a previous hx of CVA 2 years ago that affected his right side.  VS are as follows: 218/102, HR: 80 O2 Sat 98% on 3L of O2. CBG 128.

## 2014-05-24 NOTE — Code Documentation (Signed)
69yo [facility] at 1156.  EMS reports that the patient woke up this morning with a headache.  He went outside to walk the dog and had sudden onset dizziness and fell.  Patient reports everything went "black" and that he was unsteady when he got out of bed.  EMS assessed left sided weakness and activated a code stroke.  Patient with a h/o stroke affecting his right side.  Labs drawn on arrival and patient to CT with stroke team at the bedside.  NIHSS 3, see documentation for details and code stroke times.  Patient with LLE weakness and reports decreased sensation on the left side.  Dr. Roseanne RenoStewart at the bedside.  No acute stroke treatment at this time, however, patient remains in the window to treat with tPA should symptoms worsen until 1515.  Bedside handoff with ED RN Marchelle FolksAmanda.

## 2014-05-25 DIAGNOSIS — G451 Carotid artery syndrome (hemispheric): Secondary | ICD-10-CM | POA: Diagnosis not present

## 2014-05-25 DIAGNOSIS — R0789 Other chest pain: Secondary | ICD-10-CM | POA: Diagnosis not present

## 2014-05-25 DIAGNOSIS — R2990 Unspecified symptoms and signs involving the nervous system: Secondary | ICD-10-CM | POA: Diagnosis not present

## 2014-05-25 DIAGNOSIS — Z9889 Other specified postprocedural states: Secondary | ICD-10-CM

## 2014-05-25 DIAGNOSIS — G459 Transient cerebral ischemic attack, unspecified: Secondary | ICD-10-CM | POA: Diagnosis not present

## 2014-05-25 DIAGNOSIS — I69354 Hemiplegia and hemiparesis following cerebral infarction affecting left non-dominant side: Secondary | ICD-10-CM | POA: Diagnosis not present

## 2014-05-25 DIAGNOSIS — I6521 Occlusion and stenosis of right carotid artery: Secondary | ICD-10-CM | POA: Diagnosis not present

## 2014-05-25 DIAGNOSIS — E785 Hyperlipidemia, unspecified: Secondary | ICD-10-CM | POA: Insufficient documentation

## 2014-05-25 DIAGNOSIS — I10 Essential (primary) hypertension: Secondary | ICD-10-CM | POA: Diagnosis not present

## 2014-05-25 DIAGNOSIS — I2510 Atherosclerotic heart disease of native coronary artery without angina pectoris: Secondary | ICD-10-CM

## 2014-05-25 DIAGNOSIS — R531 Weakness: Secondary | ICD-10-CM | POA: Diagnosis not present

## 2014-05-25 DIAGNOSIS — F329 Major depressive disorder, single episode, unspecified: Secondary | ICD-10-CM

## 2014-05-25 DIAGNOSIS — I480 Paroxysmal atrial fibrillation: Secondary | ICD-10-CM | POA: Diagnosis not present

## 2014-05-25 DIAGNOSIS — Z7982 Long term (current) use of aspirin: Secondary | ICD-10-CM

## 2014-05-25 DIAGNOSIS — I350 Nonrheumatic aortic (valve) stenosis: Secondary | ICD-10-CM | POA: Diagnosis not present

## 2014-05-25 DIAGNOSIS — I6789 Other cerebrovascular disease: Secondary | ICD-10-CM

## 2014-05-25 DIAGNOSIS — Z87891 Personal history of nicotine dependence: Secondary | ICD-10-CM | POA: Insufficient documentation

## 2014-05-25 DIAGNOSIS — I5032 Chronic diastolic (congestive) heart failure: Secondary | ICD-10-CM

## 2014-05-25 LAB — COMPREHENSIVE METABOLIC PANEL
ALK PHOS: 88 U/L (ref 39–117)
ALT: 22 U/L (ref 0–53)
AST: 22 U/L (ref 0–37)
Albumin: 3.5 g/dL (ref 3.5–5.2)
Anion gap: 7 (ref 5–15)
BILIRUBIN TOTAL: 0.9 mg/dL (ref 0.3–1.2)
BUN: 16 mg/dL (ref 6–23)
CO2: 26 mmol/L (ref 19–32)
Calcium: 8.6 mg/dL (ref 8.4–10.5)
Chloride: 106 mmol/L (ref 96–112)
Creatinine, Ser: 0.97 mg/dL (ref 0.50–1.35)
GFR calc Af Amer: >90 mL/min (ref >90)
GFR calc non Af Amer: 82 mL/min — ABNORMAL LOW (ref >90)
GLUCOSE: 174 mg/dL — AB (ref 70–99)
POTASSIUM: 3.7 mmol/L (ref 3.5–5.1)
SODIUM: 139 mmol/L (ref 135–145)
TOTAL PROTEIN: 6.8 g/dL (ref 6.0–8.3)

## 2014-05-25 LAB — LIPID PANEL
CHOL/HDL RATIO: 4.5 ratio
Cholesterol: 147 mg/dL (ref 0–200)
HDL: 33 mg/dL — AB (ref >39)
LDL CALC: 80 mg/dL (ref 0–99)
TRIGLYCERIDES: 169 mg/dL — AB (ref <150)
VLDL: 34 mg/dL (ref 0–40)

## 2014-05-25 LAB — GLUCOSE, CAPILLARY
GLUCOSE-CAPILLARY: 125 mg/dL — AB (ref 70–99)
Glucose-Capillary: 107 mg/dL — ABNORMAL HIGH (ref 70–99)
Glucose-Capillary: 146 mg/dL — ABNORMAL HIGH (ref 70–99)
Glucose-Capillary: 93 mg/dL (ref 70–99)

## 2014-05-25 LAB — TROPONIN I
Troponin I: <0.03 ng/mL (ref <0.031)
Troponin I: 0.04 ng/mL — ABNORMAL HIGH (ref <0.031)
Troponin I: 0.04 ng/mL — ABNORMAL HIGH (ref <0.031)

## 2014-05-25 MED ORDER — ALBUTEROL SULFATE (2.5 MG/3ML) 0.083% IN NEBU: 2.5000 mg | INHALATION_SOLUTION | RESPIRATORY_TRACT | Status: DC | PRN

## 2014-05-25 MED ORDER — FAMOTIDINE 20 MG PO TABS
40.0000 mg | ORAL_TABLET | Freq: Once | ORAL | Status: AC
  Administered 2014-05-25: 40 mg via ORAL
  Filled 2014-05-25: qty 2

## 2014-05-25 NOTE — Progress Notes (Signed)
STROKE TEAM PROGRESS NOTE   HISTORY Jorge [name] is a 69 y.o. male who stated he suffered a stroke two years ago. In the chart there is record he is allergic to coumadin however patient has no recollection of being on coumadin or what the reaction may have been. Today he awoke at 0800 hours and was feeling a little off balance but otherwise normal. At 1000 he went out to walk his dog. At 1045 he sat down due to feeling dizzy and then feels he may have passed out. Upon waking he noted he had left sided weakness and numbness. He also states three days ago he was walking out of a [place] restaurant when his left leg became very weak, but then resolved. Currently he still feels his left arm and leg are heavy. NIHSS 3. CT scan of his head showed no acute intracranial abnormality. Patient was admitted for evaluation to rule out possible acute stroke.  Date last known well: Date: 05/24/2014 Time last known well: Time: 10:45 tPA Given: No: minimal symptoms  SUBJECTIVE (INTERVAL HISTORY) There are no family members present. The patient feels his deficits are much improved.     OBJECTIVE Temp:  [97.8 F (36.6 C)-98.6 F (37 C)] 97.8 F (36.6 C) (02/18 2110) Pulse Rate:  [75-125] 75 (02/18 2110) Cardiac Rhythm:  [-] Normal sinus rhythm;Sinus tachycardia (02/18 0800) Resp:  [18-22] 20 (02/18 2110) BP: (95-156)/(47-100) 150/73 mmHg (02/18 2110) SpO2:  [89 %-96 %] 93 % (02/18 2110)   Recent Labs Lab 05/24/14 1200 05/24/14 2055 05/25/14 0653 05/25/14 1137 05/25/14 1645  GLUCAP 107* 74 107* 146* 93    Recent Labs Lab 05/24/14 1159 05/24/14 1205 05/25/14 0844  NA 138 139 139  K 3.9 3.9 3.7  CL 103 101 106  CO2 25  --  26  GLUCOSE 118* 122* 174*  BUN 23 26* 16  CREATININE 1.18 1.10 0.97  CALCIUM 8.8  --  8.6    Recent Labs Lab 05/24/14 1159 05/25/14 0844  AST 20 22  ALT 22 22  ALKPHOS 90 88  BILITOT 0.6 0.9  PROT 7.3 6.8  ALBUMIN 4.0 3.5    Recent Labs Lab  05/24/14 1159 05/24/14 1205  WBC 6.8  --   NEUTROABS 4.6  --   HGB 14.8 16.7  HCT 44.4 49.0  MCV 91.5  --   PLT 202  --     Recent Labs Lab 05/25/14 0525 05/25/14 1105 05/25/14 1653  TROPONINI <0.03 0.04* 0.04*    Recent Labs  05/24/14 1159  LABPROT 13.7  INR 1.04   No results for input(s): COLORURINE, LABSPEC, PHURINE, GLUCOSEU, HGBUR, BILIRUBINUR, KETONESUR, PROTEINUR, UROBILINOGEN, NITRITE, LEUKOCYTESUR in the last 72 hours.  Invalid input(s): APPERANCEUR     Component Value Date/Time   CHOL 147 05/25/2014 0844   TRIG 169* 05/25/2014 0844   HDL 33* 05/25/2014 0844   CHOLHDL 4.5 05/25/2014 0844   VLDL 34 05/25/2014 0844   LDLCALC 80 05/25/2014 0844   No results found for: HGBA1C No results found for: LABOPIA, COCAINSCRNUR, LABBENZ, AMPHETMU, THCU, LABBARB  No results for input(s): ETH in the last 168 hours.  Dg Chest 2 View 05/24/2014    No active cardiopulmonary disease.     Ct Head (brain) Wo Contrast 05/24/2014    1. No evidence of acute intracranial disease.  2. Mild chronic small vessel ischemia.     Mr Brain Wo Contrast 05/24/2014    1. No acute intracranial abnormality.  2. Mild to  moderate for age nonspecific signal changes in the brain, most commonly due to chronic small vessel disease.  3. Negative intracranial MRA.     2-D echocardiogram 05/25/2014 Study Conclusions - Left ventricle: The cavity size was normal. There was moderate concentric hypertrophy. Systolic function was normal. The estimated ejection fraction was in the range of 60% to 65%. Wall motion was normal; there were no regional wall motion abnormalities. There was an increased relative contribution of atrial contraction to ventricular filling. Doppler parameters are consistent with abnormal left ventricular relaxation (grade 1 diastolic dysfunction).  CUS - There is 60-79% right ICA stenosis, lowest end of scale. The left CEA is patent. 1-39% left ICA  stenosis. The right vertebral artery exhibits a "bunny sign" waveform. Left vertebral artery flow is antegrade.    PHYSICAL EXAM  Temp:  [97.8 F (36.6 C)-98.6 F (37 C)] 97.8 F (36.6 C) (02/18 2110) Pulse Rate:  [75-125] 75 (02/18 2110) Resp:  [18-22] 20 (02/18 2110) BP: (95-156)/(47-100) 150/73 mmHg (02/18 2110) SpO2:  [89 %-96 %] 93 % (02/18 2110)  General - Well nourished, well developed, in no apparent distress.  Ophthalmologic - Sharp disc margins OU.  Cardiovascular - Regular rate and rhythm with no murmur.  Mental Status -  Level of arousal and orientation to time, place, and person were intact. Language including expression, naming, repetition, comprehension was assessed and found intact.  Cranial Nerves II - XII - II - Visual field intact OU. III, IV, VI - Extraocular movements intact. V - Facial sensation slightly decreased on the left. VII - Facial movement intact bilaterally. VIII - Hearing & vestibular intact bilaterally. X - Palate elevates symmetrically. XI - Chin turning & shoulder shrug intact bilaterally. XII - Tongue protrusion intact.  Motor Strength - The patient's strength was 5/5 right UE and RLE however, there was give-away weakness at left UE and LLE. Bulk was normal and fasciculations were absent.   Motor Tone - Muscle tone was assessed at the neck and appendages and was normal.  Reflexes - The patient's reflexes were normal in all extremities and he had no pathological reflexes.  Sensory - Light touch, temperature/pinprick subjectively slightly decreased on the left UE and LLE.    Coordination - The patient had normal movements in the hands and feet with no ataxia or dysmetria.  Tremor was absent.  Gait and Station - not tested.  ASSESSMENT/PLAN Jorge [name] is a 69 y.o. male with history of atrial fibrillation not anticoagulated ,hypertension, hyperlipidemia, possible seizure s/p left CEA  presenting with possible syncope,  left-sided weakness and numbness.Marland Kitchen He did not receive IV t-PA due to minimal deficits on arrival.   ? TIA:  Right hemisphere TIA - probably embolic secondary to atrial fibrillation vs. Artery to Artery emboli from right ICA stenosis.   Resultant  resolution of deficits   MRI  No acute abnormality  MRA  negative   Carotid Doppler  Right ICA 60-79% stenosis   2D Echo - EF 60-65%. No cardiac source of emboli identified.  LDL 80   HgbA1c pending  Subcutaneous heparin for VTE prophylaxis  Diet Heart with thin liquids  aspirin 81 mg orally every day prior to admission, now on aspirin 325 mg orally every day  Ongoing aggressive stroke risk factor management  Therapy recommendations:  CIR   Disposition:  Pending  pAfib  As per Dr. 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  May consider vascular surgery consult if CTA showed right ICA stenosis >50%  Hypertension  Home meds: Lisinopril 40 mg daily and metoprolol 75 mg daily   Blood pressure low at times - antihypertensive medications on hold  Avoid hypotension due to right ICA stenosis  Hyperlipidemia  Home meds: Zocor 40 mg daily resumed in hospital  LDL 80, goal < 70  Consider increasing Zocor   Continue statin at discharge  Other Stroke Risk Factors  Advanced age  Cigarette smoker, quit smoking 1997  Obesity, Body mass index is 47.08 kg/(m^2).   Hx stroke/TIA   Other Active Problems  Possible syncope  Possible seizure disorder  Other Pertinent History    Hospital day # 1  Delton See PA-C [facility] Pager [phone] 05/25/2014, 9:48 PM  I,  the attending vascular neurologist, have personally obtained a history, examined the patient, evaluated laboratory data, individually viewed imaging studies and agree with radiology interpretations. Together with the NP/PA, we formulated the assessment and plan of care which reflects our mutual decision.  I have made any additions or clarifications directly to the above note and agree with the findings and plan as currently documented.   69 yo M with hx of pAfib, left CEA, right ICA stenosis, HTN, HLD admitted for possible syncope with left sided weakness and numbness. Symptoms resolved, and MRI and MRA negative. Pt has some functional component present, wondering if symptoms anxiety related. However, pt has multiple risk factors for stroke including pAfib, right ICA stenosis, HTN, HLD, recommend to start NOAC for stroke prevention, repeat CTA neck to evaluate right ICA stenosis as well as right VA/subclavian steal. Continue zocor.    Marvel Plan, MD PhD Stroke Neurology 05/25/2014 10:24 PM        To contact Stroke Continuity provider, please refer to [email or web address]. After hours, contact General Neurology

## 2014-05-25 NOTE — Evaluation (Signed)
Speech Language Pathology Evaluation Patient Details Name: Jorge [name] MRN: [id number] DOB: January 04, 1946 Today's Date: 05/25/2014 Time: 4098-1191 SLP Time Calculation (min) (ACUTE ONLY): 29 min  Problem List:  Patient Active Problem List   Diagnosis Date Noted  . CAD (coronary artery disease) 05/25/2014  . History of tobacco abuse 05/25/2014  . Stroke-like symptoms 05/24/2014  . Dyspnea 03/22/2012  . Aortic stenosis 03/22/2012  . Acute edema of lung, unspecified 03/22/2012  . History of CEA (carotid endarterectomy) 03/22/2012  . Chest pain 03/22/2012   Past Medical History:  Past Medical History  Diagnosis Date  . HTN (hypertension)   . Hyperlipidemia   . Edema   . Chest pain   . SOB (shortness of breath)   . Neuropathy   . Stroke    Past Surgical History:  Past Surgical History  Procedure Laterality Date  . Cardiac catheterization    . Back surgery     HPI:  Pt is a 69 y/o man with CVA v TIA 2013 requiring LCEA complicated by seizures, PAF, HTN, CHF, HL who presents with L sided weakness and loss of consciousness. He said he woke up this morning at about 8 am feeling at baseline except for mild headache. He went to walk his dog at about 10:45 am when he felt very dizzy and "blacked out" on the grass of his yard. He was unsure if he hit his head. No post-ictal state, loss of bowel or bladder consciousness. He said he has since felt increased heaviness of his L arm and leg with paresthesia. He says he has baseline L sided weakness since his cerebrovascular event about two years ago which was complicated by seizures and required a left carotid endarterectomy. He also notes that about three days ago he had trouble lifting his left leg out of his car but that resolved shortly after. He otherwise denies any chest pain, SOB, nausea, emesis, diarrhea, constipation, fevers, chills, night sweats, vision changes. CT revealed no evidence of acute intracranial disease and mild chronic  small vessel ischemia. MRI revealed no acute intracranial abnormality. Mild to moderate for age nonspecific signal changes in the brain, most commonly due to chronic small vessel disease. Negative intracranial MRA.   Assessment / Plan / Recommendation Clinical Impression    Goal of session was to assess pt's speech and language skills. Pt demonstrates adequate vocal quality, volume and articulation. Pt demonstrates mild impairment in cognitive functioning including memory (storage and retrieval) and attention. Suspect pt's memory impairment impacted his ability to complete tasks during assessment of attention skills. Pt demonstrates adequate problem solving and organization skills. Discussed with pt concern regarding memory impairments; pt will require assistance at home with functional tasks such as money and medication management. Pt able to complete functional task when given visual cues. Pt reported his prior stroke did not affect his memory and discussed that his son visits him almost every day, however, pt recognized he will need assistance once discharged. Pt would be a good candidate for CIR.Speech will follow-up with cognitive therapy.     SLP Assessment       Follow Up Recommendations       Frequency and Duration min 2x/week  2 weeks   Pertinent Vitals/Pain Pain Assessment: 0-10 Pain Score: 3  Pain Location: Lt knee Pain Intervention(s): Limited activity within patient's tolerance;Monitored during session;Repositioned   SLP Goals  Potential to Achieve Goals (ACUTE ONLY): Fair  SLP Evaluation Prior Functioning  Cognitive/Linguistic Baseline: Information not available Type of Home:  Apartment  Lives With: Alone Available Help at Discharge: Family;Available PRN/intermittently Vocation: Retired   IT consultantCognition  Overall Cognitive Status: Impaired/Different from baseline Arousal/Alertness: Awake/alert Orientation Level: Oriented X4 Attention: Focused;Sustained Focused Attention:  Impaired Focused Attention Impairment: Verbal basic Sustained Attention: Impaired Sustained Attention Impairment: Verbal basic Memory: Impaired Memory Impairment: Storage deficit;Retrieval deficit Awareness: Appears intact Problem Solving: Appears intact Executive Function: Reasoning;Sequencing Reasoning: Appears intact Sequencing: Appears intact Safety/Judgment: Appears intact (Had discussion about memory impairments with pt and safety.)    Comprehension  Auditory Comprehension Overall Auditory Comprehension: Appears within functional limits for tasks assessed Conversation: Simple Interfering Components: Working memory EffectiveTechniques: Repetition;Extra processing time Visual Recognition/Discrimination Discrimination: Within Function Limits Reading Comprehension Reading Status: Within funtional limits    Expression Expression Primary Mode of Expression: Verbal Verbal Expression Overall Verbal Expression: Appears within functional limits for tasks assessed Initiation: No impairment Pragmatics: No impairment Written Expression Dominant Hand: Left Written Expression: Exceptions to [facility] Surgery Center Limited PartnershipWFL (Jittery hand writing. )   Oral / Motor Oral Motor/Sensory Function Overall Oral Motor/Sensory Function: Appears within functional limits for tasks assessed Labial ROM: Within Functional Limits Labial Symmetry: Within Functional Limits Lingual ROM: Within Functional Limits Lingual Symmetry: Within Functional Limits Motor Speech Overall Motor Speech: Appears within functional limits for tasks assessed Articulation: Within functional limitis Intelligibility: Intelligible Motor Planning: Witnin functional limits   GO     Bermudez-Bosch, Shameer Molstad 05/25/2014, 11:38 AM

## 2014-05-25 NOTE — Progress Notes (Signed)
Received prescreen request for inpatient rehab from PT and have reviewed pt's case. "MRI returned negative for acute intracranial abnormality but had mild to moderate age nonspecific signal changes in brain most commonly due to chronic small vessel disease." In addition, latest OT note documents pt performing mobility and self care tasks at a min guard level.  At this time, we are recommending that home with home health or skilled nursing be pursued as we anticipate that pt will continue to make progress in light of his negative CVA work up.  Thanks.  Juliann MuleJanine Sherrie Marsan, PT Rehabilitation Admissions Coordinator [phone]

## 2014-05-25 NOTE — Progress Notes (Signed)
Pt c/o chest tightness and pain. Also c/o of new onset left arm "soreness". Pt also c/o of being hot and was found to be diaphoretic with covers off. BP 111/81, HR 120-150, RR 22, O2 89% on RA. Placed pt on 2L nasal cannula. O2 sat increased to 95% on 2L. On call MD paged. EKG ordered and performed. Troponin labs ordered.

## 2014-05-25 NOTE — Progress Notes (Addendum)
Called by RN stating that patient is having some chest tightness. Asked to get EKG stat. Went to see the patient.   He stated he feels some mild tightness in chest, no sharp or pressure like pain. Having some mild dyspnea with it. Was satting 89% per RN, put on 2Lo2 and satting fine now. States "may be its acid reflux". Also has hx of COPD per chart, doesn't take any inhalers. Has hx of AS. Had chest pain in the past, cardiology office note stated could be from AS.   Exam:  Heent: normal.  Lungs: CTAB. Heart: AS murmur noted, otherwise regular rate and rhythm. Abd: obese, soft, nt, nd Legs: no edema noted.  Filed Vitals:   05/25/14 0300  BP: 116/100  Pulse: 125  Temp: 98 F (36.7 C)  Resp: 20     Of note, had some sinus tach in 140's non sustained on tele. It was called "vtach" but I looks like narrow complex to me.  A/p:  Chest pain unclear etiology. Does not sound like angina however will rule out MI Has hx of AS, dyspnea and chest pain could be from that per cardiology note on 03/2012 when he had chest pain. myoview 2013 normal. - check EKG and trend trops x3. Oxygen PRN to keep sat >92% Already has full dose asa Already on heparin PE ppx, this is new onset chest tightness, could be PE but likely not. Could be GERD, will try pepcid Chest tightness could also be from COPD. Will add albuterol neb q4hr prn.   -----  Addendum: EKG reviewed, no ischemic changes, sinus tach 103. Per RN, pain is gone right now.

## 2014-05-25 NOTE — Evaluation (Signed)
Occupational Therapy Evaluation Patient Details Name: Jorge [name] MRN: [id number] DOB: Jan 16, 1946 Today's Date: 05/25/2014    History of Present Illness Adm s/p fall with LOC; Lt sided weakness; NIH 3; MRI brain negative acute process  PMHx- '13 Lt CVA vs TIA with Lt CEA; post-op seizure; residual ?rt sided weakness (chart reports Lt, pt reports Rt); PAF, HTN, HLD   Clinical Impression   Pt admitted with the above diagnoses and presents with below problem list. Pt will benefit from continued acute OT to address the below listed deficits and maximize independence with basic ADLs prior to d/c home. PTA pt was mod I with ADLs. Pt currently at min guard level for LB ADLs and functional mobility. Pt at min guard level for LB ADLs and functional mobility this session.     Follow Up Recommendations  Supervision - Intermittent;Home health OT    Equipment Recommendations  Tub/shower seat    Recommendations for Other Services       Precautions / Restrictions Precautions Precautions: Fall      Mobility Bed Mobility Overal bed mobility: Needs Assistance Bed Mobility: Supine to Sit     Supine to sit: Mod assist;HOB elevated     General bed mobility comments: pt EOB upon arrival and returned to recliner at end of session  Transfers Overall transfer level: Needs assistance Equipment used: Rolling walker (2 wheeled) Transfers: Sit to/from Stand Sit to Stand: Min guard         General transfer comment: min guard for safety. use of grab bars from toilet    Balance Overall balance assessment: Needs assistance;History of Falls Sitting-balance support: No upper extremity supported;Feet supported Sitting balance-Leahy Scale: Good Sitting balance - Comments: removed old socks and donned new with incr effort and time   Standing balance support: Bilateral upper extremity supported;During functional activity Standing balance-Leahy Scale: Fair Standing balance comment: no LOB  observed while during oral care in standing                            ADL Overall ADL's : Needs assistance/impaired Eating/Feeding: Sitting;Set up   Grooming: Set up;Oral care;Standing   Upper Body Bathing: Set up;Sitting   Lower Body Bathing: Min guard;Sit to/from stand   Upper Body Dressing : Set up;Sitting   Lower Body Dressing: Min guard;Sit to/from stand   Toilet Transfer: Min guard;Ambulation;Comfort height toilet;Grab bars;RW   Toileting- ArchitectClothing Manipulation and Hygiene: Min guard;Sit to/from stand   Tub/ Shower Transfer: Min guard;Ambulation;Rolling walker;3 in 1   Functional mobility during ADLs: Min guard;Rolling walker General ADL Comments: Pt min guard level for LB ADLs. No LOB observed this session during functional in-room tasks in standing position. Edcuated pt on signs of stroke and fall prevention tips.      Vision     Perception     Praxis      Pertinent Vitals/Pain Pain Assessment: 0-10 Pain Score: 3  Pain Location: Lt knee and lower back with walking Pain Descriptors / Indicators: Aching Pain Intervention(s): Limited activity within patient's tolerance;Monitored during session;Repositioned     Hand Dominance Left   Extremity/Trunk Assessment Upper Extremity Assessment Upper Extremity Assessment: Generalized weakness;Overall WFL for tasks assessed (able to use LUE on grab bar to assist with sit>stand )   Lower Extremity Assessment Lower Extremity Assessment: Defer to PT evaluation RLE Deficits / Details:  LLE Deficits / Details:   Cervical / Trunk Assessment Cervical / Trunk Assessment: Normal  Communication Communication Communication: No difficulties   Cognition Arousal/Alertness: Awake/alert Behavior During Therapy: WFL for tasks assessed/performed Overall Cognitive Status: Within Functional Limits for tasks assessed Area of Impairment:      Memory:  (pt stated he felt "like my memory may not be as good"); no  observable memory deficits during this session        General Comments: LUE appears a little stronger than RUE grossly, BUE WFL.  General Comments       Exercises       Shoulder Instructions      Home Living Family/patient expects to be discharged to:: Private residence Living Arrangements: Alone Available Help at Discharge: Family;Available PRN/intermittently Type of Home: Independent living facility Home Access: Stairs to enter Entrance Stairs-Number of Steps: 1 Entrance Stairs-Rails: None Home Layout: One level     Bathroom Shower/Tub: Chief Strategy Officer: Standard     Home Equipment: Cane - single point;Walker - 4 wheels;Grab bars - tub/shower;Adaptive equipment Adaptive Equipment: Reacher    Lives With: Alone    Prior Functioning/Environment Level of Independence: Independent with assistive device(s)        Comments: uses cane vs walker    OT Diagnosis: Other (comment);Acute pain (decreased balance)   OT Problem List: Decreased activity tolerance;Impaired balance (sitting and/or standing);Decreased knowledge of use of DME or AE;Decreased knowledge of precautions;Pain   OT Treatment/Interventions: Self-care/ADL training;Energy conservation;DME and/or AE instruction;Therapeutic activities;Patient/family education;Balance training    OT Goals(Current goals can be found in the care plan section) Acute Rehab OT Goals Patient Stated Goal: go home OT Goal Formulation: With patient Time For Goal Achievement: 06/01/14 Potential to Achieve Goals: Good ADL Goals Pt Will Perform Lower Body Dressing: with modified independence;with adaptive equipment;sit to/from stand Pt Will Transfer to Toilet: with modified independence;ambulating;regular height toilet;grab bars Pt Will Perform Toileting - Clothing Manipulation and hygiene: with modified independence;sit to/from stand Pt Will Perform Tub/Shower Transfer: with modified independence;ambulating;shower  seat;3 in 1;rolling walker  OT Frequency: Min 3X/week   Barriers to D/C:            Co-evaluation              End of Session Equipment Utilized During Treatment: Gait belt;Rolling walker  Activity Tolerance: Patient tolerated treatment well Patient left: in chair;with call bell/phone within reach;with chair alarm set   Time: 1205-1238 OT Time Calculation (min): 33 min Charges:  OT General Charges $OT Visit: 1 Procedure OT Evaluation $Initial OT Evaluation Tier I: 1 Procedure OT Treatments $Self Care/Home Management : 8-22 mins G-Codes:    Jorge [name] June 22, 2014, 1:10 PM

## 2014-05-25 NOTE — Progress Notes (Signed)
Subjective: Mr Domangue complained of chest pressure and dyspnea early this morning. See separate documentation from night MD team. He says it has since resolved and he has no more SOB while on 2L O2 nasal cannula. He now says this morning that his residual weakness is on the R side from previous stroke 2 years ago.  Objective: Vital signs in last 24 hours: Filed Vitals:   05/25/14 0500 05/25/14 0505 05/25/14 0646 05/25/14 1012  BP: 111/81  117/80 95/56  Pulse: 122  98 83  Temp: 98.2 F (36.8 C)  98 F (36.7 C) 98.1 F (36.7 C)  TempSrc: Oral  Oral Oral  Resp: 22  20 20   Height:      Weight:      SpO2: 89% 95% 94% 95%   Weight change:   Intake/Output Summary (Last 24 hours) at 05/25/14 1110 Last data filed at 05/25/14 0006  Gross per 24 hour  Intake      0 ml  Output    675 ml  Net   -675 ml   Gen: A&O x 4, no acute distress, obese HEENT: Atraumatic, PERRL, EOMI, sclerae anicteric, moist mucous membranes Neck: No carotid bruits or JVD, L carotid endarterectomy scar Heart: Regular rate and rhythm, normal S1 S2, 1/6 systolic ejection murmur Lungs: Clear to auscultation bilaterally, respirations unlabored Abd: Soft, non-tender, non-distended, + bowel sounds, no hepatosplenomegaly Ext: B/l trace pedal edema Neuro: A&O x 4, CN II-XII intact, b/l intention tremor on finger-nose-finger, strength 5/5 in RU and RLE but 4/5 in LU and LLE, sensation grossly intact, no Babinski  Lab Results: Basic Metabolic Panel:  Recent Labs Lab 05/24/14 1159 05/24/14 1205 05/25/14 0844  NA 138 139 139  K 3.9 3.9 3.7  CL 103 101 106  CO2 25  --  26  GLUCOSE 118* 122* 174*  BUN 23 26* 16  CREATININE 1.18 1.10 0.97  CALCIUM 8.8  --  8.6   Liver Function Tests:  Recent Labs Lab 05/24/14 1159 05/25/14 0844  AST 20 22  ALT 22 22  ALKPHOS 90 88  BILITOT 0.6 0.9  PROT 7.3 6.8  ALBUMIN 4.0 3.5   CBC:  Recent Labs Lab 05/24/14 1159 05/24/14 1205  WBC 6.8  --   NEUTROABS 4.6   --   HGB 14.8 16.7  HCT 44.4 49.0  MCV 91.5  --   PLT 202  --    Cardiac Enzymes:  Recent Labs Lab 05/25/14 0525  TROPONINI <0.03   CBG:  Recent Labs Lab 05/24/14 1200 05/24/14 2055 05/25/14 0653  GLUCAP 107* 74 107*   Fasting Lipid Panel:  Recent Labs Lab 05/25/14 0844  CHOL 147  HDL 33*  LDLCALC 80  TRIG 161*  CHOLHDL 4.5   Coagulation:  Recent Labs Lab 05/24/14 1159  LABPROT 13.7  INR 1.04   Micro Results: No results found for this or any previous visit (from the past 240 hour(s)). Studies/Results: Dg Chest 2 View  05/24/2014   CLINICAL DATA:  Stroke-like symptoms.  The patient fell today.  EXAM: CHEST  2 VIEW  COMPARISON:  05/06/2008 and 04/28/2008  FINDINGS: Heart size and pulmonary vascularity are normal and the lungs are clear. No osseous abnormality.  IMPRESSION: No active cardiopulmonary disease.   Electronically Signed   By: Francene Boyers M.D.   On: 05/24/2014 20:45   Ct Head (brain) Wo Contrast  05/24/2014   CLINICAL DATA:  Code stroke with left arm weakness.  EXAM: CT HEAD WITHOUT CONTRAST  TECHNIQUE: Contiguous axial images were obtained from the base of the skull through the vertex without intravenous contrast.  COMPARISON:  04/28/2008  FINDINGS: Skull and Sinuses:Negative for fracture or destructive process. The mastoids, middle ears, and imaged paranasal sinuses are clear.  Orbits: No acute abnormality.  Brain: No evidence of acute infarction, hemorrhage, hydrocephalus, or mass lesion/mass effect.  There is mild patchy bilateral cerebral white matter low density, likely chronic small vessel disease. There has been no significant progression from 2010. No asymmetric density of the central vessels.  These results were called by telephone at the time of interpretation on 05/24/2014 at 12:17 pm to Dr. Benjiman Core , who verbally acknowledged these results.  IMPRESSION: 1. No evidence of acute intracranial disease. 2. Mild chronic small vessel ischemia.    Electronically Signed   By: Marnee Spring M.D.   On: 05/24/2014 12:18   Mr Brain Wo Contrast  05/24/2014   CLINICAL DATA:  69 year old male with left side weakness and loss of consciousness. Initial encounter.  EXAM: MRI HEAD WITHOUT CONTRAST  MRA HEAD WITHOUT CONTRAST  TECHNIQUE: Multiplanar, multiecho pulse sequences of the brain and surrounding structures were obtained without intravenous contrast. Angiographic images of the head were obtained using MRA technique without contrast.  COMPARISON:  Head CT without contrast 1212 hr today and earlier.  FINDINGS: MRI HEAD FINDINGS  Cerebral volume is within normal limits for age. No restricted diffusion to suggest acute infarction. No midline shift, mass effect, evidence of mass lesion, ventriculomegaly, extra-axial collection or acute intracranial hemorrhage. Cervicomedullary junction and pituitary are within normal limits. Negative visualized cervical spine. Major intracranial vascular flow voids are preserved.  Scattered mild to moderate for age nonspecific cerebral white matter T2 and FLAIR hyperintensity. No cortical encephalomalacia identified. Deep gray matter nuclei are within normal limits. Mild to moderate patchy T2 hyperintensity in the pons. Cerebellum within normal limits.  Visible internal auditory structures appear normal. Visualized paranasal sinuses and mastoids are clear. Postoperative changes to the left globe. Negative right orbit soft tissues. Visualized scalp soft tissues are within normal limits. Normal bone marrow signal.  MRA HEAD FINDINGS  Antegrade flow in the posterior circulation. Dominant appearing distal left vertebral artery. Both PICA origins are patent. Patent basilar artery without stenosis. Fetal type right PCA origin. SCA and left PCA origins are within normal limits. PCA branches are within normal limits. Left posterior communicating artery is diminutive or absent.  Antegrade flow in both ICA siphons. No siphon stenosis.  Ophthalmic and right posterior communicating artery origins are normal. Normal carotid termini, MCA and ACA origins. Anterior communicating artery and visualized ACA branches are within normal limits. Visualized bilateral MCA branches are within normal limits.  IMPRESSION: 1. No acute intracranial abnormality. Mild to moderate for age nonspecific signal changes in the brain, most commonly due to chronic small vessel disease. 2.  Negative intracranial MRA.   Electronically Signed   By: Odessa Fleming M.D.   On: 05/24/2014 18:19   Mr Maxine Glenn Head/brain Wo Cm  05/24/2014   CLINICAL DATA:  69 year old male with left side weakness and loss of consciousness. Initial encounter.  EXAM: MRI HEAD WITHOUT CONTRAST  MRA HEAD WITHOUT CONTRAST  TECHNIQUE: Multiplanar, multiecho pulse sequences of the brain and surrounding structures were obtained without intravenous contrast. Angiographic images of the head were obtained using MRA technique without contrast.  COMPARISON:  Head CT without contrast 1212 hr today and earlier.  FINDINGS: MRI HEAD FINDINGS  Cerebral volume is within normal limits for age.  No restricted diffusion to suggest acute infarction. No midline shift, mass effect, evidence of mass lesion, ventriculomegaly, extra-axial collection or acute intracranial hemorrhage. Cervicomedullary junction and pituitary are within normal limits. Negative visualized cervical spine. Major intracranial vascular flow voids are preserved.  Scattered mild to moderate for age nonspecific cerebral white matter T2 and FLAIR hyperintensity. No cortical encephalomalacia identified. Deep gray matter nuclei are within normal limits. Mild to moderate patchy T2 hyperintensity in the pons. Cerebellum within normal limits.  Visible internal auditory structures appear normal. Visualized paranasal sinuses and mastoids are clear. Postoperative changes to the left globe. Negative right orbit soft tissues. Visualized scalp soft tissues are within normal  limits. Normal bone marrow signal.  MRA HEAD FINDINGS  Antegrade flow in the posterior circulation. Dominant appearing distal left vertebral artery. Both PICA origins are patent. Patent basilar artery without stenosis. Fetal type right PCA origin. SCA and left PCA origins are within normal limits. PCA branches are within normal limits. Left posterior communicating artery is diminutive or absent.  Antegrade flow in both ICA siphons. No siphon stenosis. Ophthalmic and right posterior communicating artery origins are normal. Normal carotid termini, MCA and ACA origins. Anterior communicating artery and visualized ACA branches are within normal limits. Visualized bilateral MCA branches are within normal limits.  IMPRESSION: 1. No acute intracranial abnormality. Mild to moderate for age nonspecific signal changes in the brain, most commonly due to chronic small vessel disease. 2.  Negative intracranial MRA.   Electronically Signed   By: Odessa FlemingH  Hall M.D.   On: 05/24/2014 18:19   Medications: I have reviewed the patient's current medications. Scheduled Meds: . aspirin  325 mg Oral Daily  . heparin  5,000 Units Subcutaneous 3 times per day  . simvastatin  40 mg Oral QPM   Continuous Infusions: . sodium chloride 75 mL/hr at 05/24/14 2255   PRN Meds:.acetaminophen, albuterol, senna-docusate Assessment/Plan: Active Problems:   History of CEA (carotid endarterectomy)   Stroke-like symptoms   CAD (coronary artery disease)  #Stroke like symptoms: Mr [name] has no new complaints this morning. He thinks he still feels weak on the L side from event yesterday but now says his residual symptoms are on the R side. His exam today does not have decreased sensation in cranial nerve V as yesterday. He is a poor historian. He was evaluated by PT who recommend CIR. MRI returned negative for acute intracranial abnormality but had mild to moderate age nonspecific signal changes in brain most commonly due to chronic small vessel  disease. Lipid panel unremarkable. 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 He had likely CVA in 2013 (although EMR calls it TIA on previous cardiology visit). He then underwent L carotid endarterectomy later that year. -appreciate neuro -2d echo w contrast -carotid dopplers -check hemoglobin A1c -hold antihypertensives for permissive hypertension -ASA 325 mg po daily -PT/OT/SLP -neuro checks q4h -cardiac monitoring  #Chest pressure: Mr [name] had episode of chest pressure with desaturation to 89% on room air early this morning. He thought at the time it could be acid reflux. EKG was unchanged and now trending troponins which are negative x 1. CXR no active cardiopulmonary disease. He says he has had this before and attributed to aortic stenosis. Previously planning for echo as part of CVA work-up. Given pepcid and albuterol q4h prn -trend troponin q6h x 3 with concurrent EKG -cardiac monitoring -albuterol  2.5 mg neb q4hprn  #HTN: BP 950s-110s/50s-80s while antihypertensives held in setting of permissive HTN. At home he is on ASA 81 mg daily, lasix 40 mg daily, lopressor 75 mg daily, simvastatin 40 mg qhs.  -ASA 325 mg daily, simvastatin 40 mg qhs -hold antihypertensives in setting of permissive hypertension  #Chronic CHF: Non-contributory. Trace pedal edema b/l on exam but lungs clear. Last echo 03/2012 with EF 55-65% and grade 1 diastolic dysfunction. CXR without pulmonary edema. At home he is on ASA 81 mg daily, lasix 40 mg daily,lopressor 75 mg daily, simvastatin 40 mg qhs. -hold antihypertensives in setting of permissive hypertension  #Paroxysmal atrial fibrillation: Currently in NSR. Rate controlled on lopressor 75 mg daily(?). Per cardiology note 03/22/2012 he was previously on coumadin but this was stopped and listed as an allergy as he had a hematoma (on  cards note) vs rash (on allergy list). CHADS2VASC score is 5 which is 7.2% risk per year of stroke. He is a fall risk so would not start on anti-coagulation.  -cardiac monitoring -PCP may consider anti-coagulation given CHADS2VASC score but also question of history of hematoma and he is a fall risk -hold lopressor in setting of permissive HTN  #HL: Lipid panel this admission w choleserol 147, LDL 80, HDL 33, and triglyceride 1569. At home he is on simvastatin 40 mg qhs -lipid panel -consider more intense statin therapy  #Depression: At home he takes sertraline 100 mg daily -can restart after passing swallow screen  #Diet: heart  #DVT PPx: heparin 5000 u Keo tid  #Code: full  Dispo: Disposition is deferred at this time, awaiting improvement of current medical problems.  Anticipated discharge in approximately 1-2 day(s).   The patient does have a current PCP (Ailene Ravel, MD) and does need an [facility] hospital follow-up appointment after discharge.  The patient does not know have transportation limitations that hinder transportation to clinic appointments.  .Services Needed at time of discharge: Y = Yes, Blank = No PT:   OT:   RN:   Equipment:   Other:     LOS: 1 day   Lorenda Hatchet, MD 05/25/2014, 11:10 AM

## 2014-05-25 NOTE — Progress Notes (Signed)
VASCULAR LAB PRELIMINARY  PRELIMINARY  PRELIMINARY  PRELIMINARY  Carotid Dopplers completed.    Preliminary report:  There is 60-79% right ICA stenosis, lowest end of scale.  The left CEA is patent. 1-39% left ICA stenosis.  The right vertebral artery exhibits a "bunny sign" waveform.  Left vertebral artery flow is antegrade.   Joshawa Dubin, RVT 05/25/2014, 11:21 AM

## 2014-05-25 NOTE — Progress Notes (Signed)
UR complete.  Sharron Petruska RN, MSN 

## 2014-05-25 NOTE — Progress Notes (Signed)
PT Cancellation Note  Patient Details Name: Jorge DroughtGeorge O Hofmann MRN: [id number] DOB: March 08, 1946   Cancelled Treatment:    Reason Eval/Treat Not Completed: Pt on bedrest. Will notify MD to incr activity orders when appropriate   Tashira Torre 05/25/2014, 8:17 AM Pager [phone]

## 2014-05-25 NOTE — Progress Notes (Signed)
  Echocardiogram 2D Echocardiogram has been performed.  Aris EvertsRix, Fannye Myer A 05/25/2014, 3:18 PM

## 2014-05-25 NOTE — Progress Notes (Signed)
On call MD paged about EKG. Pt O2 sat staying steady at 95% on 2L. Will keep pt on oxygen for now. No other new orders at this time. Will continue to monitor.

## 2014-05-25 NOTE — Evaluation (Signed)
Physical Therapy Evaluation Patient Details Name: Jorge [name] MRN: [id number] DOB: 1945-12-10 Today's Date: 05/25/2014   History of Present Illness  Adm s/p fall with LOC; Lt sided weakness; NIH 3; MRI brain negative acute process  PMHx- '13 Lt CVA vs TIA with Lt CEA; post-op seizure; residual ?rt sided weakness (chart reports Lt, pt reports Rt); PAF, HTN, HLD    Clinical Impression  Pt admitted with above diagnosis. Pt currently with functional limitations due to the deficits listed below (see PT Problem List). Pt with significant Lt sided weakness as compared to his recent baseline. Noted memory and word-finding issues. Pt will benefit from skilled PT to increase their independence and safety with mobility to allow discharge to the venue listed below.       Follow Up Recommendations CIR;Supervision/Assistance - 24 hour    Equipment Recommendations  None recommended by PT    Recommendations for Other Services OT consult;Rehab consult     Precautions / Restrictions Precautions Precautions: Fall      Mobility  Bed Mobility Overal bed mobility: Needs Assistance Bed Mobility: Supine to Sit     Supine to sit: Mod assist;HOB elevated     General bed mobility comments: pt exit to Rt (as at home); incr effort and time with ultimate need for mod assist to raise torso and get RUE in position to push up to sitting  Transfers Overall transfer level: Needs assistance Equipment used: Rolling walker (2 wheeled) Transfers: Sit to/from Stand Sit to Stand: Min guard         General transfer comment: vc for safe use of RW (pt accustomed to rollator with brakes);   Ambulation/Gait Ambulation/Gait assistance: Min assist Ambulation Distance (Feet): 24 Feet Assistive device: Rolling walker (2 wheeled) Gait Pattern/deviations: Step-through pattern;Decreased stride length;Decreased dorsiflexion - left Gait velocity: decr Gait velocity interpretation: Below normal speed for  age/gender General Gait Details: very short stride length; as fatigued became more of a shuffling gait  Stairs            Wheelchair Mobility    Modified Rankin (Stroke Patients Only) Modified Rankin (Stroke Patients Only) Pre-Morbid Rankin Score: Slight disability Modified Rankin: Moderately severe disability     Balance Overall balance assessment: History of Falls;Needs assistance (reports 2 near falls in past 6 months) Sitting-balance support: No upper extremity supported;Feet supported Sitting balance-Leahy Scale: Good Sitting balance - Comments: removed old socks and donned new with incr effort and time   Standing balance support: Bilateral upper extremity supported Standing balance-Leahy Scale: Poor Standing balance comment: feels LLE is weak and unsafe if tries to let go of RW                             Pertinent Vitals/Pain Pain Assessment: 0-10 Pain Score: 3  Pain Location: Lt knee Pain Intervention(s): Limited activity within patient's tolerance;Monitored during session;Repositioned    Home Living Family/patient expects to be discharged to:: Private residence Living Arrangements: Alone Available Help at Discharge: Family;Available PRN/intermittently (son, grandson) Type of Home: Apartment Home Access: Stairs to enter Entrance Stairs-Rails: None Entrance Stairs-Number of Steps: 1 Home Layout: One level Home Equipment: Cane - single point;Walker - 4 wheels;Grab bars - tub/shower      Prior Function Level of Independence: Independent with assistive device(s)         Comments: uses cane vs walker     Hand Dominance   Dominant Hand: Left    Extremity/Trunk Assessment  Upper Extremity Assessment: Defer to OT evaluation           Lower Extremity Assessment: RLE deficits/detail;LLE deficits/detail RLE Deficits / Details: AROM WFL; knee extension 4/5, DF 5/5 LLE Deficits / Details: AROM WFL; knee extension 3+, ankle DF  3-  Cervical / Trunk Assessment: Normal  Communication   Communication: Expressive difficulties (multiple times word-finding problems)  Cognition Arousal/Alertness: Awake/alert Behavior During Therapy: WFL for tasks assessed/performed Overall Cognitive Status: Impaired/Different from baseline Area of Impairment: Memory;Awareness     Memory: Decreased short-term memory     Awareness: Emergent   General Comments: initially reporting Lt side back to normal; as began to mobilize, he recognized that Lt side remains significantly weak    General Comments      Exercises        Assessment/Plan    PT Assessment Patient needs continued PT services  PT Diagnosis Hemiplegia dominant side   PT Problem List Decreased strength;Decreased activity tolerance;Decreased balance;Decreased mobility;Decreased cognition;Decreased knowledge of use of DME;Obesity;Pain  PT Treatment Interventions DME instruction;Gait training;Stair training;Functional mobility training;Therapeutic activities;Balance training;Neuromuscular re-education;Cognitive remediation;Patient/family education   PT Goals (Current goals can be found in the Care Plan section) Acute Rehab PT Goals Patient Stated Goal: return home soon (due to financial concerns) PT Goal Formulation: With patient Time For Goal Achievement: 06/01/14 Potential to Achieve Goals: Good    Frequency Min 4X/week   Barriers to discharge Decreased caregiver support intermittent assist from family    Co-evaluation               End of Session Equipment Utilized During Treatment: Gait belt Activity Tolerance: Patient limited by fatigue Patient left: in chair;with call bell/phone within reach;with chair alarm set           Time: [id number] PT Time Calculation (min) (ACUTE ONLY): 36 min   Charges:   PT Evaluation $Initial PT Evaluation Tier I: 1 Procedure PT Treatments $Gait Training: 8-22 mins   PT G Codes:         Jorge [name] 05/25/2014, 9:44 AM Pager [phone]

## 2014-05-25 NOTE — Care Management Note (Addendum)
    Page 1 of 2   05/31/2014     1:31:21 PM CARE MANAGEMENT NOTE 05/31/2014  Patient:  Jorge [name], Jorge [name]   Account Number:  [id number]  Date Initiated:  05/25/2014  Documentation initiated by:  Lorne Skeens  Subjective/Objective Assessment:   Patient admitted for TIA workup.  Lives at home alone.     Action/Plan:   Will follow for discharge needs pending PT/OT evals and physician's orders   Anticipated DC Date:  05/31/2014   Anticipated DC Plan:  HOME/SELF CARE         Choice offered to / List presented to:  C-1 Patient   DME arranged  Vassie Moselle      DME agency  [place] arranged  [place].   Status of service:  Completed, signed off Medicare Important Message given?  YES (If response is "NO", the following Medicare IM given date fields will be blank) Date Medicare IM given:  05/29/2014 Medicare IM given by:  Olga Coaster Date Additional Medicare IM given:   Additional Medicare IM given by:    Discharge Disposition:  [place]  Per UR Regulation:  Reviewed for med. necessity/level of care/duration of stay  If discussed at [place] of Stay Meetings, dates discussed:    Comments:  05-31-14 1137 Jacqlyn Krauss, RN,BSN [phone] Referral for Eliquis received: Benefits check in process and will make pt aware once completed. CM will provide pt with 30 day free card. Pt will need Rx for  Eliquis 30 day free card before d/c. Pt uses [facility] and medication is available. DME RW to be delivered to pt's room via [facility]. No further needs at this time. PER REP AT [facility] RX:  ELIQUIS: [place] [id number] PATIENT HAS $165 DEDUCTIBLE - HAS NOT BEEN MET/ PATIENT IS RESPONSIBLE FOR COST OF MEDICATION - ESTIMATED CO-PAY $45.00 PLUS DEDUCTIBLE  WHEN AUTH IS DONE TELL THEM TO MARK AS URGENT   05/25/14 [name] RN, MSN, CM- Met with patient to answer  questions regarding financial concerns. Patient voiced concerns about paying his hospital bill after discharge.  CM instructed patient to call the phone number on his bill to discuss payment options with the billing department.  Patient verbalized understanding.

## 2014-05-26 ENCOUNTER — Observation Stay (HOSPITAL_COMMUNITY): Payer: Medicare Other

## 2014-05-26 ENCOUNTER — Encounter (HOSPITAL_COMMUNITY): Payer: Self-pay

## 2014-05-26 DIAGNOSIS — R0789 Other chest pain: Secondary | ICD-10-CM | POA: Diagnosis not present

## 2014-05-26 DIAGNOSIS — G451 Carotid artery syndrome (hemispheric): Secondary | ICD-10-CM | POA: Diagnosis not present

## 2014-05-26 DIAGNOSIS — I6521 Occlusion and stenosis of right carotid artery: Secondary | ICD-10-CM | POA: Diagnosis not present

## 2014-05-26 DIAGNOSIS — I6529 Occlusion and stenosis of unspecified carotid artery: Secondary | ICD-10-CM | POA: Insufficient documentation

## 2014-05-26 DIAGNOSIS — G459 Transient cerebral ischemic attack, unspecified: Secondary | ICD-10-CM | POA: Diagnosis not present

## 2014-05-26 DIAGNOSIS — I10 Essential (primary) hypertension: Secondary | ICD-10-CM | POA: Diagnosis not present

## 2014-05-26 DIAGNOSIS — E785 Hyperlipidemia, unspecified: Secondary | ICD-10-CM | POA: Diagnosis not present

## 2014-05-26 DIAGNOSIS — Z9889 Other specified postprocedural states: Secondary | ICD-10-CM | POA: Diagnosis not present

## 2014-05-26 DIAGNOSIS — R531 Weakness: Secondary | ICD-10-CM | POA: Diagnosis not present

## 2014-05-26 LAB — BASIC METABOLIC PANEL
Anion gap: 8 (ref 5–15)
BUN: 12 mg/dL (ref 6–23)
CALCIUM: 8.6 mg/dL (ref 8.4–10.5)
CO2: 25 mmol/L (ref 19–32)
Chloride: 105 mmol/L (ref 96–112)
Creatinine, Ser: 0.96 mg/dL (ref 0.50–1.35)
GFR, EST NON AFRICAN AMERICAN: 83 mL/min — AB (ref >90)
GLUCOSE: 122 mg/dL — AB (ref 70–99)
POTASSIUM: 4.1 mmol/L (ref 3.5–5.1)
SODIUM: 138 mmol/L (ref 135–145)

## 2014-05-26 LAB — CBC
HEMATOCRIT: 44.4 % (ref 39.0–52.0)
HEMOGLOBIN: 14.8 g/dL (ref 13.0–17.0)
MCH: 30.8 pg (ref 26.0–34.0)
MCHC: 33.3 g/dL (ref 30.0–36.0)
MCV: 92.3 fL (ref 78.0–100.0)
Platelets: 219 10*3/uL (ref 150–400)
RBC: 4.81 MIL/uL (ref 4.22–5.81)
RDW: 14.3 % (ref 11.5–15.5)
WBC: 7.2 10*3/uL (ref 4.0–10.5)

## 2014-05-26 LAB — GLUCOSE, CAPILLARY
GLUCOSE-CAPILLARY: 119 mg/dL — AB (ref 70–99)
Glucose-Capillary: 103 mg/dL — ABNORMAL HIGH (ref 70–99)
Glucose-Capillary: 96 mg/dL (ref 70–99)
Glucose-Capillary: 119 mg/dL — ABNORMAL HIGH (ref 70–99)

## 2014-05-26 LAB — HEMOGLOBIN A1C
Hgb A1c MFr Bld: 5.7 % — ABNORMAL HIGH (ref 4.8–5.6)
Mean Plasma Glucose: 117 mg/dL

## 2014-05-26 MED ORDER — SERTRALINE HCL 100 MG PO TABS
100.0000 mg | ORAL_TABLET | Freq: Every day | ORAL | Status: DC
  Administered 2014-05-26 – 2014-05-31 (×5): 100 mg via ORAL
  Filled 2014-05-26 (×5): qty 1

## 2014-05-26 MED ORDER — ATORVASTATIN CALCIUM 40 MG PO TABS
40.0000 mg | ORAL_TABLET | Freq: Every day | ORAL | Status: DC
  Administered 2014-05-26 – 2014-05-30 (×5): 40 mg via ORAL
  Filled 2014-05-26 (×6): qty 1

## 2014-05-26 MED ORDER — IOHEXOL 350 MG/ML SOLN
80.0000 mL | Freq: Once | INTRAVENOUS | Status: AC | PRN
  Administered 2014-05-26: 80 mL via INTRAVENOUS

## 2014-05-26 NOTE — Progress Notes (Signed)
UR completed 

## 2014-05-26 NOTE — Clinical Social Work Psychosocial (Signed)
Clinical Social Work Department BRIEF PSYCHOSOCIAL ASSESSMENT 05/26/2014  Patient:  Jorge [name], Jorge [name]     Account Number:  [id number]     Admit date:  05/24/2014  Clinical Social Worker:  Glendon Axe, CLINICAL SOCIAL WORKER  Date/Time:  05/26/2014 03:55 PM  Referred by:  Physician  Date Referred:  05/26/2014 Referred for  SNF Placement   Other Referral:   Interview type:  Patient Other interview type:   CSW met with patient at bedside.    PSYCHOSOCIAL DATA Living Status:  ALONE Admitted from facility:  N/a  Level of care:  N/a  Primary support name:  Loistine Chance Primary support relationship to patient:  CHILD, ADULT Degree of support available:   Strong    CURRENT CONCERNS Current Concerns  Post-Acute Placement   Other Concerns:    SOCIAL WORK ASSESSMENT / PLAN Clinical Social Worker met with patient in reference to post-acute placement for SNF. CSW explained CSW role and SNF process. Patient stated he received information on CIR however he wishes to return home with his dog. Patient asked several questions regarding Home Health services. CSW notified RNCM to meet with pt to present Home Health services. Pt reported he is ready to return home with his dog and told funny stories about EMS encounter with dog. No further intervention required. CSW signing off.   Assessment/plan status:  No Further Intervention Required Other assessment/ plan:  n/a Information/referral to community resources:  SNF information  PATIENT'S/FAMILY'S RESPONSE TO PLAN OF CARE: Pt alert and oriented X4. Pt wishes to return home with Home Health services. Pt very pleasant and appreciated social work intervention.     Glendon Axe, MSW, LCSWA [phone] 05/26/2014 4:12 PM

## 2014-05-26 NOTE — Progress Notes (Signed)
Subjective: Jorge [name]. Mr Barich said he had no chest pain, SOB overnight. He says he has no new weakness. I told him that we are trying to decide whether to start him on anti-coagulation. He reiterated that he was on warfarin in the past but does not remember why it was stopped. I explained to him the risks and benefits of anti-coagulation to reduce the risk of embolic event in the setting of atrial fibrillation versus his fall risk and risk of bleeding. He says he would like anti-coagulation as he feels the risk of stroke outweighs the risk of a bleed.    Objective: Vital signs in last 24 hours: Filed Vitals:   05/25/14 2110 05/26/14 0149 05/26/14 0549 05/26/14 1011  BP: 150/73 141/84 145/74 136/57  Pulse: 75 79 79 76  Temp: 97.8 F (36.6 C) 97.8 F (36.6 C) 97.7 F (36.5 C) 97.9 F (36.6 C)  TempSrc: Oral Oral Oral Oral  Resp: 20 18 22 20   Height:      Weight:      SpO2: 93% 97% 94%    Weight change:   Intake/Output Summary (Last 24 hours) at 05/26/14 1055 Last data filed at 05/26/14 0900  Gross per 24 hour  Intake    240 ml  Output      0 ml  Net    240 ml   Gen: A&O x 4, no acute distress, obese HEENT: Atraumatic, surgical pupil OS, EOMI, sclerae anicteric, moist mucous membranes Neck: No carotid bruits or JVD, L carotid endarterectomy scar Heart: Regular rate and rhythm, normal S1 S2, 1/6 systolic ejection murmur Lungs: Clear to auscultation bilaterally, respirations unlabored Abd: Soft, non-tender, non-distended, + bowel sounds, no hepatosplenomegaly Ext: B/l trace pedal edema Neuro: A&O x 4, CN II-XII intact, b/l intention tremor on finger-nose-finger, strength 5/5 in RU and RLE but 4/5 in LU and LLE, sensation grossly intact, no Babinski  Lab Results: Basic Metabolic Panel:  Recent Labs Lab 05/25/14 0844 05/26/14 0736  NA 139 138  K 3.7 4.1  CL 106 105  CO2 26 25  GLUCOSE 174* 122*  BUN 16 12  CREATININE 0.97 0.96  CALCIUM 8.6 8.6   Liver Function  Tests:  Recent Labs Lab 05/24/14 1159 05/25/14 0844  AST 20 22  ALT 22 22  ALKPHOS 90 88  BILITOT 0.6 0.9  PROT 7.3 6.8  ALBUMIN 4.0 3.5   CBC:  Recent Labs Lab 05/24/14 1159 05/24/14 1205 05/26/14 0736  WBC 6.8  --  7.2  NEUTROABS 4.6  --   --   HGB 14.8 16.7 14.8  HCT 44.4 49.0 44.4  MCV 91.5  --  92.3  PLT 202  --  219   Cardiac Enzymes:  Recent Labs Lab 05/25/14 0525 05/25/14 1105 05/25/14 1653  TROPONINI <0.03 0.04* 0.04*   CBG:  Recent Labs Lab 05/24/14 2055 05/25/14 0653 05/25/14 1137 05/25/14 1645 05/25/14 2113 05/26/14 0640  GLUCAP 74 107* 146* 93 125* 119*   Fasting Lipid Panel:  Recent Labs Lab 05/25/14 0844  CHOL 147  HDL 33*  LDLCALC 80  TRIG 161*  CHOLHDL 4.5   Coagulation:  Recent Labs Lab 05/24/14 1159  LABPROT 13.7  INR 1.04    TTE: - Left ventricle: The cavity size was normal. There was moderate concentric hypertrophy. Systolic function was normal. The estimated ejection fraction was in the range of 60% to 65%. Wall motion was normal; there were no regional wall motion abnormalities. There was an increased relative contribution  of atrial contraction to ventricular filling. Doppler parameters are consistent with abnormal left ventricular relaxation (grade 1 diastolic dysfunction).   Carotid duplex (prelim): There is 60-79% right ICA stenosis, lowest end of scale. The left CEA is patent. 1-39% left ICA stenosis. The right vertebral artery exhibits a "bunny sign" waveform. Left vertebral artery flow is antegrade.   Micro Results: No results found for this or any previous visit (from the past 240 hour(s)). Studies/Results: Dg Chest 2 View  05/24/2014   CLINICAL DATA:  Stroke-like symptoms.  The patient fell today.  EXAM: CHEST  2 VIEW  COMPARISON:  05/06/2008 and 04/28/2008  FINDINGS: Heart size and pulmonary vascularity are normal and the lungs are clear. No osseous abnormality.  IMPRESSION: No  active cardiopulmonary disease.   Electronically Signed   By: Francene Boyers M.D.   On: 05/24/2014 20:45   Ct Head (brain) Wo Contrast  05/24/2014   CLINICAL DATA:  Code stroke with left arm weakness.  EXAM: CT HEAD WITHOUT CONTRAST  TECHNIQUE: Contiguous axial images were obtained from the base of the skull through the vertex without intravenous contrast.  COMPARISON:  04/28/2008  FINDINGS: Skull and Sinuses:Negative for fracture or destructive process. The mastoids, middle ears, and imaged paranasal sinuses are clear.  Orbits: No acute abnormality.  Brain: No evidence of acute infarction, hemorrhage, hydrocephalus, or mass lesion/mass effect.  There is mild patchy bilateral cerebral white matter low density, likely chronic small vessel disease. There has been no significant progression from 2010. No asymmetric density of the central vessels.  These results were called by telephone at the time of interpretation on 05/24/2014 at 12:17 pm to Dr. Benjiman Core , who verbally acknowledged these results.  IMPRESSION: 1. No evidence of acute intracranial disease. 2. Mild chronic small vessel ischemia.   Electronically Signed   By: Marnee Spring M.D.   On: 05/24/2014 12:18   Ct Angio Neck W/cm &/or Wo/cm  05/26/2014   CLINICAL DATA:  Right carotid artery stenosis.  EXAM: CT ANGIOGRAPHY NECK  TECHNIQUE: Multidetector CT imaging of the neck was performed using the standard protocol during bolus administration of intravenous contrast. Multiplanar CT image reconstructions and MIPs were obtained to evaluate the vascular anatomy. Carotid stenosis measurements (when applicable) are obtained utilizing NASCET criteria, using the distal internal carotid diameter as the denominator.  CONTRAST:  80mL OMNIPAQUE IOHEXOL 350 MG/ML SOLN  COMPARISON:  MRA head 05/24/2014  FINDINGS: Aortic arch: A 3 vessel arch configuration is present. Minimal atherosclerotic calcifications are present at the aortic arch. There is no significant  stenosis associated with the great vessel origins.  Right carotid system: The right common carotid artery is within normal limits. There is significant noncalcified soft tissue plaque at the proximal right internal carotid artery. The area of narrowing spans 2.5 cm. The most focal areas stenosis is 1.5 cm from the bifurcation with the lumen is narrowed to 1.7 mm. This compares with a more distal lumen of 5.0 mm.  Left carotid system: The left common carotid artery is tortuous. Surgical clips are present. Changes of the proximal left internal carotid artery suggest carotid endarterectomy. There is irregularity within the distal anastomosis with a blind-ending pouch extending 7 mm superiorly along the medial aspect of the distal anastomosis suggesting pseudoaneurysm formation. More distal left ICA is normal.  Vertebral arteries:The vertebral artery origins are within normal limits bilaterally. Mild tortuosity is evident. The left vertebral artery is dominant. There are no significant stenoses within the neck. The vertebrobasilar junction is  visualized intracranial A. The PICA origins are visualized and normal. Mild atherosclerotic calcifications are present at the dural margins without significant stenosis.  Skeleton: Endplate degenerative changes are most evident at C6-7 with bilateral osseous foraminal stenosis. More mild endplate changes are noted at C3-4 and C5-6. The foramina are otherwise patent. No focal lytic or blastic lesions are evident.  Other neck: Limited imaging of the brain is unremarkable. The soft tissues of the neck are within normal limits. The thyroid is normal. No significant adenopathy is present. And lung apices demonstrate mild dependent atelectasis. No focal nodule, mass, or airspace disease is present. The superior mediastinum is unremarkable.  IMPRESSION: 1. Sixty- 65% stenosis of the right internal carotid artery with a long segment of atherosclerotic narrowing and noncalcified soft tissue  plaque. 2. Left carotid endarterectomy. 3. Irregularity in a blind ending pouch of the distal anastomosis suggests pseudoaneurysm formation within the distal left carotid endarterectomy. There is no proximal stenosis. 4. Mild cervical spondylosis as described.   Electronically Signed   By: Marin Robertshristopher  Mattern M.D.   On: 05/26/2014 10:07   Mr Brain Wo Contrast  05/24/2014   CLINICAL DATA:  69 year old male with left side weakness and loss of consciousness. Initial encounter.  EXAM: MRI HEAD WITHOUT CONTRAST  MRA HEAD WITHOUT CONTRAST  TECHNIQUE: Multiplanar, multiecho pulse sequences of the brain and surrounding structures were obtained without intravenous contrast. Angiographic images of the head were obtained using MRA technique without contrast.  COMPARISON:  Head CT without contrast 1212 hr today and earlier.  FINDINGS: MRI HEAD FINDINGS  Cerebral volume is within normal limits for age. No restricted diffusion to suggest acute infarction. No midline shift, mass effect, evidence of mass lesion, ventriculomegaly, extra-axial collection or acute intracranial hemorrhage. Cervicomedullary junction and pituitary are within normal limits. Negative visualized cervical spine. Major intracranial vascular flow voids are preserved.  Scattered mild to moderate for age nonspecific cerebral white matter T2 and FLAIR hyperintensity. No cortical encephalomalacia identified. Deep gray matter nuclei are within normal limits. Mild to moderate patchy T2 hyperintensity in the pons. Cerebellum within normal limits.  Visible internal auditory structures appear normal. Visualized paranasal sinuses and mastoids are clear. Postoperative changes to the left globe. Negative right orbit soft tissues. Visualized scalp soft tissues are within normal limits. Normal bone marrow signal.  MRA HEAD FINDINGS  Antegrade flow in the posterior circulation. Dominant appearing distal left vertebral artery. Both PICA origins are patent. Patent basilar  artery without stenosis. Fetal type right PCA origin. SCA and left PCA origins are within normal limits. PCA branches are within normal limits. Left posterior communicating artery is diminutive or absent.  Antegrade flow in both ICA siphons. No siphon stenosis. Ophthalmic and right posterior communicating artery origins are normal. Normal carotid termini, MCA and ACA origins. Anterior communicating artery and visualized ACA branches are within normal limits. Visualized bilateral MCA branches are within normal limits.  IMPRESSION: 1. No acute intracranial abnormality. Mild to moderate for age nonspecific signal changes in the brain, most commonly due to chronic small vessel disease. 2.  Negative intracranial MRA.   Electronically Signed   By: Odessa FlemingH  Hall M.D.   On: 05/24/2014 18:19   Mr Maxine GlennMra Head/brain Wo Cm  05/24/2014   CLINICAL DATA:  69 year old male with left side weakness and loss of consciousness. Initial encounter.  EXAM: MRI HEAD WITHOUT CONTRAST  MRA HEAD WITHOUT CONTRAST  TECHNIQUE: Multiplanar, multiecho pulse sequences of the brain and surrounding structures were obtained without intravenous contrast. Angiographic images  of the head were obtained using MRA technique without contrast.  COMPARISON:  Head CT without contrast 1212 hr today and earlier.  FINDINGS: MRI HEAD FINDINGS  Cerebral volume is within normal limits for age. No restricted diffusion to suggest acute infarction. No midline shift, mass effect, evidence of mass lesion, ventriculomegaly, extra-axial collection or acute intracranial hemorrhage. Cervicomedullary junction and pituitary are within normal limits. Negative visualized cervical spine. Major intracranial vascular flow voids are preserved.  Scattered mild to moderate for age nonspecific cerebral white matter T2 and FLAIR hyperintensity. No cortical encephalomalacia identified. Deep gray matter nuclei are within normal limits. Mild to moderate patchy T2 hyperintensity in the pons.  Cerebellum within normal limits.  Visible internal auditory structures appear normal. Visualized paranasal sinuses and mastoids are clear. Postoperative changes to the left globe. Negative right orbit soft tissues. Visualized scalp soft tissues are within normal limits. Normal bone marrow signal.  MRA HEAD FINDINGS  Antegrade flow in the posterior circulation. Dominant appearing distal left vertebral artery. Both PICA origins are patent. Patent basilar artery without stenosis. Fetal type right PCA origin. SCA and left PCA origins are within normal limits. PCA branches are within normal limits. Left posterior communicating artery is diminutive or absent.  Antegrade flow in both ICA siphons. No siphon stenosis. Ophthalmic and right posterior communicating artery origins are normal. Normal carotid termini, MCA and ACA origins. Anterior communicating artery and visualized ACA branches are within normal limits. Visualized bilateral MCA branches are within normal limits.  IMPRESSION: 1. No acute intracranial abnormality. Mild to moderate for age nonspecific signal changes in the brain, most commonly due to chronic small vessel disease. 2.  Negative intracranial MRA.   Electronically Signed   By: Odessa Fleming M.D.   On: 05/24/2014 18:19   Medications: I have reviewed the patient's current medications. Scheduled Meds: . aspirin  325 mg Oral Daily  . atorvastatin  40 mg Oral q1800  . heparin  5,000 Units Subcutaneous 3 times per day  . sertraline  100 mg Oral Daily   Continuous Infusions: . sodium chloride 75 mL/hr at 05/24/14 2255   PRN Meds:.acetaminophen, albuterol, senna-docusate Assessment/Plan: Active Problems:   History of CEA (carotid endarterectomy)   Stroke-like symptoms   CAD (coronary artery disease)   TIA (transient ischemic attack)   HLD (hyperlipidemia)   Essential hypertension  #Stroke like symptoms: Mr Lekas still has no new complaints. His exam is unchanged with some L sided weakness. PT  recommended CIR but CIR recommends home health or skilled nursing. He lives alone and is fall risk so SNF most appropriate. Work-up is notable for echo w EF 60-65%, grade 1 diastolic dysfunction. Prelim carotid doppler notable for 50-79% stenosis of R ICA but patent post-operative L ICA. Also concern with "bunny sign" waveform that there could be subclavian steal. We will get neck CTA to further characterize and if R ICA stenosis >50% consult VVS. MRI returned negative for acute intracranial abnormality but had mild to moderate age nonspecific signal changes in brain most commonly due to chronic small vessel disease. Lipid panel unremarkable, A1c 5.7. -appreciate neuro -neck cta -consider VVS consult -hold antihypertensives for permissive hypertension -ASA 325 mg po daily -PT/OT/SLP -neuro checks q4h -cardiac monitoring  #Paroxysmal atrial fibrillation: Currently in NSR. Rate controlled on lopressor 75 mg daily(?). HR 70s-90s overnight without lopressor. Per cardiology note 03/22/2012 he was previously on coumadin but this was stopped and listed as an allergy as he had a hematoma (on cards note) vs rash (  on allergy list). CHADS2VASC score is 5 which is 7.2% risk per year of stroke. He is a fall risk however.  I explained to him the risks and benefits of anti-coagulation to reduce the risk of embolic event in the setting of atrial fibrillation versus his fall risk and risk of bleeding. He says he would like anti-coagulation as he feels the risk of stroke outweighs the risk of a bleed. We will try to contact PCP for further clarification and start NOAC if no clear history of previous bleed. -cardiac monitoring -eliquis 5 mg bid pending contact w PCP -hold lopressor in setting of permissive HTN  #Chest pressure: Mr Dubray had episode of chest pressure with desaturation to 89% on room air early yesterday morning. He thought at the time it could be acid reflux. EKG was unchanged and troponin negative x 3.  CXR no active cardiopulmonary disease. He says he has had this before and attributed to aortic stenosis. Echo with non-contributory findings. He has no chest pain overnight and is sating mid to high 90s on room air.  -cardiac monitoring -albuterol 2.5 mg neb q4hprn  #HTN: BP 110s-150s/70s-80s while antihypertensives held in setting of permissive HTN. At home he is on ASA 81 mg daily, lasix 40 mg daily, lopressor 75 mg daily, simvastatin 40 mg qhs.  -ASA 325 mg daily, simvastatin 40 mg qhs -hold antihypertensives in setting of permissive hypertension  #Chronic CHF: Non-contributory. Trace pedal edema b/l on exam but lungs clear. Echo notable for EF 60-65% and grade 1 diastolic dysfunction is largely unchanged from 03/2012 with EF 55-65%.  CXR without pulmonary edema. At home he is on ASA 81 mg daily, lasix 40 mg daily,lopressor 75 mg daily, simvastatin 40 mg qhs. -hold antihypertensives in setting of permissive hypertension  #HL: Lipid panel this admission w cholesterol 147, LDL 80, HDL 33, and triglyceride 1569. At home he is on simvastatin 40 mg qhs -change simvastatin to atorvastatin 40 mg qhs  #Depression: At home he takes sertraline 100 mg daily -cont sertraline 100 mg daily  #Diet: heart  #DVT PPx: heparin 5000 u [place] tid  #Code: full  Dispo: Disposition is deferred at this time, awaiting improvement of current medical problems.  Anticipated discharge in approximately 1 day(s) to SNF.   The patient does have a current PCP (Ailene Ravel, MD) and does need an [facility] hospital follow-up appointment after discharge.  The patient does not know have transportation limitations that hinder transportation to clinic appointments.  .Services Needed at time of discharge: Y = Yes, Blank = No PT:   OT:   RN:   Equipment:   Other:     LOS: 2 days   Lorenda Hatchet, MD 05/26/2014, 10:55 AM

## 2014-05-26 NOTE — Progress Notes (Signed)
STROKE TEAM PROGRESS NOTE   HISTORY Jorge [name] is a 69 y.o. male who stated he suffered a stroke two years ago. In the chart there is record he is allergic to coumadin however patient has no recollection of being on coumadin or what the reaction may have been. Today he awoke at 0800 hours and was feeling a little off balance but otherwise normal. At 1000 he went out to walk his dog. At 1045 he sat down due to feeling dizzy and then feels he may have passed out. Upon waking he noted he had left sided weakness and numbness. He also states three days ago he was walking out of a [place] restaurant when his left leg became very weak, but then resolved. Currently he still feels his left arm and leg are heavy. NIHSS 3. CT scan of his head showed no acute intracranial abnormality. Patient was admitted for evaluation to rule out possible acute stroke.  Date last known well: Date: 05/24/2014 Time last known well: Time: 10:45 tPA Given: No: minimal symptoms  SUBJECTIVE (INTERVAL HISTORY) There are no family members present. The patient voices no complaints. CTA neck showed 65% stenosis right ICA. Recommend vascular surgery consult. Dr. Roda Shutters discussed obstructive sleep apnea. Apparently the patient was diagnosed with OSA but was not able to tolerate any assistive devices. Dr. Roda Shutters recommended that the patient have this reevaluated.   OBJECTIVE Temp:  [97.7 F (36.5 C)-98.6 F (37 C)] 97.7 F (36.5 C) (02/19 0549) Pulse Rate:  [75-93] 79 (02/19 0549) Cardiac Rhythm:  [-] Normal sinus rhythm (02/19 0758) Resp:  [18-22] 22 (02/19 0549) BP: (95-156)/(56-84) 145/74 mmHg (02/19 0549) SpO2:  [93 %-97 %] 94 % (02/19 0549)   Recent Labs Lab 05/25/14 0653 05/25/14 1137 05/25/14 1645 05/25/14 2113 05/26/14 0640  GLUCAP 107* 146* 93 125* 119*    Recent Labs Lab 05/24/14 1159 05/24/14 1205 05/25/14 0844 05/26/14 0736  NA 138 139 139 138  K 3.9 3.9 3.7 4.1  CL 103 101 106 105  CO2 25  --   26 25  GLUCOSE 118* 122* 174* 122*  BUN 23 26* 16 12  CREATININE 1.18 1.10 0.97 0.96  CALCIUM 8.8  --  8.6 8.6    Recent Labs Lab 05/24/14 1159 05/25/14 0844  AST 20 22  ALT 22 22  ALKPHOS 90 88  BILITOT 0.6 0.9  PROT 7.3 6.8  ALBUMIN 4.0 3.5    Recent Labs Lab 05/24/14 1159 05/24/14 1205 05/26/14 0736  WBC 6.8  --  7.2  NEUTROABS 4.6  --   --   HGB 14.8 16.7 14.8  HCT 44.4 49.0 44.4  MCV 91.5  --  92.3  PLT 202  --  219    Recent Labs Lab 05/25/14 0525 05/25/14 1105 05/25/14 1653  TROPONINI <0.03 0.04* 0.04*    Recent Labs  05/24/14 1159  LABPROT 13.7  INR 1.04   No results for input(s): COLORURINE, LABSPEC, PHURINE, GLUCOSEU, HGBUR, BILIRUBINUR, KETONESUR, PROTEINUR, UROBILINOGEN, NITRITE, LEUKOCYTESUR in the last 72 hours.  Invalid input(s): APPERANCEUR     Component Value Date/Time   CHOL 147 05/25/2014 0844   TRIG 169* 05/25/2014 0844   HDL 33* 05/25/2014 0844   CHOLHDL 4.5 05/25/2014 0844   VLDL 34 05/25/2014 0844   LDLCALC 80 05/25/2014 0844   Lab Results  Component Value Date   HGBA1C 5.7* 05/25/2014   No results found for: LABOPIA, COCAINSCRNUR, LABBENZ, AMPHETMU, THCU, LABBARB  No results for input(s): ETH in the  last 168 hours.  Dg Chest 2 View 05/24/2014    No active cardiopulmonary disease.     Ct Head (brain) Wo Contrast 05/24/2014    1. No evidence of acute intracranial disease.  2. Mild chronic small vessel ischemia.     Mr Brain Wo Contrast 05/24/2014    1. No acute intracranial abnormality.  2. Mild to moderate for age nonspecific signal changes in the brain, most commonly due to chronic small vessel disease.  3. Negative intracranial MRA.     2-D echocardiogram 05/25/2014 Study Conclusions - Left ventricle: The cavity size was normal. There was moderate concentric hypertrophy. Systolic function was normal. The estimated ejection fraction was in the range of 60% to 65%. Wall motion was normal; there were no  regional wall motion abnormalities. There was an increased relative contribution of atrial contraction to ventricular filling. Doppler parameters are consistent with abnormal left ventricular relaxation (grade 1 diastolic dysfunction).  CUS - There is 60-79% right ICA stenosis, lowest end of scale. The left CEA is patent. 1-39% left ICA stenosis. The right vertebral artery exhibits a "bunny sign" waveform. Left vertebral artery flow is antegrade.   CTA of the neck with and without contrast 01/24/2015 1. 60- 65% stenosis of the right internal carotid artery with a long segment of atherosclerotic narrowing and noncalcified soft tissue plaque. 2. Left carotid endarterectomy. 3. Irregularity in a blind ending pouch of the distal anastomosis suggests pseudoaneurysm formation within the distal left carotidendarterectomy. There is no proximal stenosis. 4. Mild cervical spondylosis as described.   PHYSICAL EXAM  Temp:  [97.7 F (36.5 C)-98.6 F (37 C)] 97.7 F (36.5 C) (02/19 0549) Pulse Rate:  [75-93] 79 (02/19 0549) Resp:  [18-22] 22 (02/19 0549) BP: (95-156)/(56-84) 145/74 mmHg (02/19 0549) SpO2:  [93 %-97 %] 94 % (02/19 0549)  General - Well nourished, well developed, in no apparent distress.  Ophthalmologic - Sharp disc margins OU.  Cardiovascular - Regular rate and rhythm with no murmur.  Mental Status -  Level of arousal and orientation to time, place, and person were intact. Language including expression, naming, repetition, comprehension was assessed and found intact.  Cranial Nerves II - XII - II - Visual field intact OU. III, IV, VI - Extraocular movements intact. V - Facial sensation slightly decreased on the left. VII - Facial movement intact bilaterally. VIII - Hearing & vestibular intact bilaterally. X - Palate elevates symmetrically. XI - Chin turning & shoulder shrug intact bilaterally. XII - Tongue protrusion intact.  Motor Strength - The patient's  strength was 5/5 bilateral UEs and LEs. Bulk was normal and fasciculations were absent.   Motor Tone - Muscle tone was assessed at the neck and appendages and was normal.  Reflexes - The patient's reflexes were normal in all extremities and he had no pathological reflexes.  Sensory - Light touch, temperature/pinprick subjectively slightly decreased on the left UE and LLE.    Coordination - The patient had normal movements in the hands and feet with no ataxia or dysmetria.  Tremor was absent.  Gait and Station - not tested.  ASSESSMENT/PLAN Jorge [name] is a 69 y.o. male with history of atrial fibrillation not anticoagulated ,hypertension, hyperlipidemia, possible seizure s/p left CEA  presenting with possible syncope, left-sided weakness and numbness.Marland Kitchen. He did not receive IV t-PA due to minimal deficits on arrival.   TIA:  Right hemisphere TIA - probably embolic secondary to atrial fibrillation vs. Artery to Artery emboli from right ICA stenosis.  Resultant  resolution of deficits   MRI  No acute abnormality  MRA  negative   CTA of neck - right ICA 65% stenosis, left ICA status post CEA with pseudoaneurysm.  Carotid Doppler  Right ICA 60-79% stenosis   2D Echo - EF 60-65%. No cardiac source of emboli identified.  LDL 80, not at goal  HgbA1c 5.7  Subcutaneous heparin for VTE prophylaxis  Diet Heart with thin liquids  aspirin 81 mg orally every day prior to admission, now on aspirin 325 mg orally every day  Ongoing aggressive stroke risk factor management  Therapy recommendations:  CIR   Disposition:  Pending  pAfib  As per Dr. Fabio Bering note, pt was on coumadin briefly in the past  Not sure why off coumadin, questionable for coumadin allergy  Consider NOAC, recommend eliquis  bid  Due to potential carotid procedure, would consider anticoagulation after all procedures done.  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  Agree with cerebral angiogram to further evaluation.  Hypertension  Home meds: Lisinopril 40 mg daily and metoprolol 75 mg daily   Blood pressure low at times - antihypertensive medications on hold  Avoid hypotension due to right ICA stenosis  Hyperlipidemia  Home meds: Zocor 40 mg daily resumed in hospital  LDL 80, goal < 70  On Lipitor 40 mg  Continue statin at discharge  Other Stroke Risk Factors  Advanced age  Cigarette smoker, quit smoking 1997  Obesity, Body mass index is 47.08 kg/(m^2).   Hx stroke/TIA   Other Active Problems  Possible syncope  Possible seizure disorder  PLAN  The stroke team will sign off at this time - please call if we can be of further assistance.  Follow-up with Dr. Roda Shutters in 1 to 2 months.  Consider further outpatient evaluation of obstructive sleep apnea.  Vascular surgery consult for CTA findings.  Consider NOAC for PAF  Avoid hypotension  Continue Lipitor at time of discharge.  Hospital day # 2  Delton See PA-C [facility] Pager [phone] 05/26/2014, 9:32 AM  I, the attending vascular neurologist, have personally obtained a history, examined the patient, evaluated laboratory data, individually viewed imaging studies and agree with radiology interpretations. Together with the NP/PA, we formulated the assessment and plan of care which reflects our mutual decision.  I have made any additions or clarifications directly to the above note and agree with the findings and plan as currently documented.   69 yo M with hx of pAfib, left CEA, right ICA stenosis, HTN, HLD admitted for possible syncope with left sided weakness and numbness. Symptoms resolved, and MRI and MRA negative. Pt has some functional component present, wondering if symptoms anxiety related.  However, pt has multiple risk factors for stroke including pAfib, right ICA stenosis, HTN, HLD, recommend to start NOAC for stroke prevention after all procedure done. Recommend vascular surgery consult to evaluate right carotid stenosis, left post CEA pseudoaneurysm, and the possible right subclavian steal syndrome. Agree with cerebral angiogram for further evaluation as per vascular surgery.   Neurology will sign off. Please call with questions. Pt will follow up with Dr. Roda Shutters at [facility] in about 1-2 months. Thanks for the consult.    Marvel Plan, MD PhD Stroke Neurology 05/26/2014 3:27 PM  To contact Stroke Continuity provider,  please refer to [email or web address] After hours, contact General Neurology

## 2014-05-26 NOTE — Consult Note (Addendum)
[facility] [place]  Patient name: Jorge [name] MRN: [id number] DOB: 05/31/1945 Sex: male  REASON FOR CONSULT: Symptomatic carotid disease  HPI: Jorge [name] is a 69 y.o. male who was admitted on 05/24/2014 with left-sided weakness and loss of consciousness. He awoke on the morning of admission with a mild headache. He was subsequently outside with his dog and blacked out. When he awoke he noticed increased heaviness in his left arm and leg with paresthesias. He states that his left-sided weakness has improved but has not resolved completely. He denies any associated expressive or receptive aphasia. He denies any history of amaurosis fugax.  He states that he underwent a left carotid endarterectomy in 2013 by Dr. Darrick Penna but I cannot find these records. He states that he had a stroke prior to his carotid endarterectomy associated with right-sided weakness. Postoperatively he apparently had seizures.  He does have a history of atrial fibrillation but cannot take Coumadin as he does not tolerate this. He thinks he is allergic.  Past Medical History  Diagnosis Date  . HTN (hypertension)   . Hyperlipidemia   . Edema   . Chest pain   . SOB (shortness of breath)   . Neuropathy   . Stroke     No family history on file. he denies any family history of premature cardiovascular disease.  SOCIAL HISTORY: History  Substance Use Topics  . Smoking status: Former Games developer  . Smokeless tobacco: Former Neurosurgeon    Quit date: 03/22/1996  . Alcohol Use: No  He is divorced. He has 3 sons. He quit tobacco 40 years ago.  Allergies  Allergen Reactions  . Coumadin [Warfarin Sodium] Rash    Current Facility-Administered Medications  Medication Dose Route Frequency Provider Last Rate Last Dose  . 0.9 %  sodium chloride infusion   Intravenous Continuous Genelle Gather, MD 75 mL/hr at 05/24/14 2255    . acetaminophen (TYLENOL) tablet 650 mg  650 mg Oral Q6H PRN Cain Saupe, RN   650 mg at 05/26/14 0548  . albuterol (PROVENTIL) (2.5 MG/3ML) 0.083% nebulizer solution 2.5 mg  2.5 mg Nebulization Q4H PRN Tasrif Ahmed, MD      . aspirin tablet 325 mg  325 mg Oral Daily Genelle Gather, MD   325 mg at 05/26/14 1032  . atorvastatin (LIPITOR) tablet 40 mg  40 mg Oral q1800 Lorenda Hatchet, MD      . heparin injection 5,000 Units  5,000 Units Subcutaneous 3 times per day Genelle Gather, MD   5,000 Units at 05/26/14 [phone]  . senna-docusate (Senokot-S) tablet 1 tablet  1 tablet Oral QHS PRN Genelle Gather, MD      . sertraline (ZOLOFT) tablet 100 mg  100 mg Oral Daily Lorenda Hatchet, MD   100 mg at 05/26/14 1032    REVIEW OF SYSTEMS: Arly.Keller ] denotes positive finding; [  ] denotes negative finding CARDIOVASCULAR:  Arly.Keller ] chest pain    chest pressure    palpitations   Arly.Keller ] orthopnea   Arly.Keller ] dyspnea on exertion   Arly.Keller ] claudication    rest pain    DVT    phlebitis PULMONARY:    productive cough    asthma    wheezing NEUROLOGIC:   Arly.Keller ] weakness  Arly.Keller ] paresthesias   aphasia   amaurosis  Arly.Keller ] dizziness HEMATOLOGIC:     bleeding problems    clotting disorders MUSCULOSKELETAL:  Arly.Keller ] joint pain    joint swelling  leg swelling GASTROINTESTINAL:   blood in stool    hematemesis GENITOURINARY:    dysuria    hematuria PSYCHIATRIC:   history of major depression INTEGUMENTARY:   rashes   ulcers CONSTITUTIONAL:   fever    chills  PHYSICAL EXAM: Filed Vitals:   05/25/14 2110 05/26/14 0149 05/26/14 0549 05/26/14 1011  BP: 150/73 141/84 145/74 136/57  Pulse: 75 79 79 76  Temp: 97.8 F (36.6 C) 97.8 F (36.6 C) 97.7 F (36.5 C) 97.9 F (36.6 C)  TempSrc: Oral Oral Oral Oral  Resp: Height:      Weight:      SpO2: 93% 97% 94% 98%   Body mass index is 47.08 kg/(m^2). GENERAL: The patient is a well-nourished male, in no acute distress. The vital signs are documented above. CARDIOVASCULAR: There is a  regular rate and rhythm. He has a systolic murmur. He has bilateral carotid bruits. He has palpable femoral pulses and palpable dorsalis pedis pulses bilaterally. PULMONARY: There is good air exchange bilaterally without wheezing or rales. ABDOMEN: Soft and non-tender with normal pitched bowel sounds. I am unable to assess for an aneurysm because of his size. MUSCULOSKELETAL: There are no major deformities or cyanosis. NEUROLOGIC: he has mild left upper extremity and left lower extremity weakness. SKIN: There are no ulcers or rashes noted. PSYCHIATRIC: The patient has a normal affect.  DATA:  Lab Results  Component Value Date   WBC 7.2 05/26/2014   HGB 14.8 05/26/2014   HCT 44.4 05/26/2014   MCV 92.3 05/26/2014   PLT 219 05/26/2014   Lab Results  Component Value Date   NA 138 05/26/2014   K 4.1 05/26/2014   CL 105 05/26/2014   CO2 25 05/26/2014   Lab Results  Component Value Date   CREATININE 0.96 05/26/2014   Lab Results  Component Value Date   INR 1.04 05/24/2014   Lab Results  Component Value Date   HGBA1C 5.7* 05/25/2014   CBG (last 3)   Recent Labs  05/25/14 2113 05/26/14 0640 05/26/14 1114  GLUCAP 125* 119* 103*   ECHO: ejection fraction is estimated at 60-65%. No evidence of aortic stenosis was noted.  MRI shows no evidence of acute intracranial abnormality.  CAROTID DUPLEX: Carotid duplex scan yesterday shows a 60-79% right carotid stenosis in the lower end of that range. There is no evidence of recurrent carotid stenosis on the left where he has undergone previous left carotid endarterectomy.  CT ANGIOGRAM OF THE NECK: I have reviewed his CT angiogram of the neck. This shows a 60-65% stenosis of the right internal carotid artery over a fairly long segment. There appears to be a small pseudoaneurysm at the distal aspect of his left carotid endarterectomy site.  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 He has a moderate 60-70% right carotid stenosis. I have reviewed his CT angiogram and his duplex and it appears that the stenosis extends fairly high. However the stenosis only looks moderate and fairly smooth. Therefore I cannot be sure that this is responsible for his right brain event. For this reason I would recommend a cerebral arteriogram to further assess this. In addition,this patient has  undergone a previous left carotid endarterectomy in the past. CT angiogram suggests a possible pseudoaneurysm at the distal aspect of his carotid endarterectomy site and arteriography would also allow us to evaluate this. In addition, he had an abnormal signal in his right vertebral artery and does have some history of dizziness. Cerebral arteriography would also allow us to evaluate this. He is being considered for anticoagulation given his history of paroxysmal atrial fibrillation and would probably be best to obtain his arteriogram before starting this. In addition he has significant cardiac disease with a history of congestive heart failure. He also had chest pain this admission. If his cerebral arteriogram suggested that he might benefit from carotid endarterectomy or carotid stenting than he would need preoperative cardiac evaluation. Currently he is on aspirin and is on a statin. Although he notes some kidney issues in the past his creatinine is normal currently. We could potentially schedule him for cerebral arteriography Monday or Tuesday of next week.  Caylea Foronda S [facility] [place]: [phone]  Addendum: Our office is found his previous op note. He had a left carotid endarterectomy by [name] in 2004.

## 2014-05-26 NOTE — Progress Notes (Signed)
Occupational Therapy Treatment Patient Details Name: Jorge [name] MRN: [id number] DOB: [birth year] Today's Date: 05/26/2014    History of present illness Adm s/p fall with LOC; Lt sided weakness; NIH 3; MRI brain negative acute process  PMHx- '13 Lt CVA vs TIA with Lt CEA; post-op seizure; residual ?rt sided weakness (chart reports Lt, pt reports Rt); PAF, HTN, HLD   OT comments  Pt progressing toward goals.  He is able to perform BADLs with supervision/set up.  Instructed pt in options for tub DME.  He will purchase a tub transfer bench on his own.  Will continue to follow.  Follow Up Recommendations  Home health OT    Equipment Recommendations       Recommendations for Other Services      Precautions / Restrictions Precautions Precautions: Fall       Mobility Bed Mobility               General bed mobility comments: Pt sitting up in chair  Transfers Overall transfer level: Needs assistance Equipment used: Rolling walker (2 wheeled) Transfers: Sit to/from Stand;Stand Pivot Transfers Sit to Stand: Supervision Stand pivot transfers: Supervision       General transfer comment: Pt demonstrates good safety awareness    Balance Overall balance assessment: Needs assistance Sitting-balance support: Feet supported Sitting balance-Leahy Scale: Good     Standing balance support: During functional activity Standing balance-Leahy Scale: Fair                     ADL                       Lower Body Dressing: Supervision/safety;Sit to/from stand   Toilet Transfer: Supervision/safety;Ambulation;Comfort height toilet;Grab bars   Toileting- Clothing Manipulation and Hygiene: Supervision/safety;Sit to/from stand   Tub/ Shower Transfer: Supervision/safety;Ambulation;Tub bench;Rolling walker   Functional mobility during ADLs: Supervision/safety;Rolling walker General ADL Comments: Pt instructed on use and acquisition of tub transfer bench.  He will  acquire one on his own.  Pt reports he performed sponge bath with set up assist only this am.       Vision                     Perception     Praxis      Cognition   Behavior During Therapy: [facility]FL for tasks assessed/performed Overall Cognitive Status: Within Functional Limits for tasks assessed                  General Comments: Pt able to recall details of    Extremity/Trunk Assessment               Exercises     Shoulder Instructions       General Comments      Pertinent Vitals/ Pain       Pain Assessment: 0-10 Pain Score: 3  Pain Location: Lt knee Pain Descriptors / Indicators: Aching (chronic in nature) Pain Intervention(s): Monitored during session  Home Living                                          Prior Functioning/Environment              Frequency Min 3X/week     Progress Toward Goals  OT Goals(current goals can now be found in the care plan section)  ADL Goals Pt Will Perform Lower Body Dressing: with modified independence;with adaptive equipment;sit to/from stand Pt Will Transfer to Toilet: with modified independence;ambulating;regular height toilet;grab bars Pt Will Perform Toileting - Clothing Manipulation and hygiene: with modified independence;sit to/from stand Pt Will Perform Tub/Shower Transfer: with modified independence;ambulating;shower seat;3 in 1;rolling walker  Plan Discharge plan remains appropriate;Equipment recommendations need to be updated    Co-evaluation                 End of Session Equipment Utilized During Treatment: Rolling walker   Activity Tolerance Patient tolerated treatment well   Patient Left in chair;with call bell/phone within reach;with chair alarm set   Nurse Communication Mobility status        Time: 1610-9604 OT Time Calculation (min): 32 min  Charges: OT General Charges $OT Visit: 1 Procedure OT Treatments $Self Care/Home Management : 23-37  mins  Javanna Patin M 05/26/2014, 12:38 PM

## 2014-05-26 NOTE — Progress Notes (Signed)
Physical Therapy Treatment Patient Details Name: Jorge [name] MRN: [id number] DOB: [birth year] Today's Date: 05/26/2014    History of Present Illness Adm s/p fall with LOC; Lt sided weakness; NIH 3; MRI brain negative acute process  PMHx- '13 Lt CVA vs TIA with Lt CEA; post-op seizure; residual ?rt sided weakness (chart reports Lt, pt reports Rt); PAF, HTN, HLD    PT Comments    Patient moving better today and demonstrating safe use of RW. Although he does not feel his Lt side is back to his baseline, he does feel it is improved. Agree with home with HHPT.   Follow Up Recommendations  Home health PT;Supervision - Intermittent     Equipment Recommendations  Rolling walker with 5" wheels (reports rollator does not work inside his home (too wide))    Recommendations for Other Services       Precautions / Restrictions Precautions Precautions: Fall    Mobility  Bed Mobility Overal bed mobility: Needs Assistance Bed Mobility: Rolling;Sidelying to Sit;Sit to Supine Rolling: Supervision Sidelying to sit: Supervision   Sit to supine: Modified independent (Device/Increase time)   General bed mobility comments: pt exit to Rt (as at home); educated pt on rolling onto his side and then side to sit; vc for sequencing/technique and near need for assist   Transfers Overall transfer level: Modified independent Equipment used: Rolling walker (2 wheeled) Transfers: Sit to/from Stand Sit to Stand: Modified independent (Device/Increase time)         General transfer comment: demonstrated appropriate sequencing x 2  Ambulation/Gait Ambulation/Gait assistance: Supervision Ambulation Distance (Feet): 6 Feet Assistive device: Rolling walker (2 wheeled) Gait Pattern/deviations: Step-through pattern;Decreased stride length Gait velocity: decr   General Gait Details: reported he recently ambulated with OT to gym for tub transfer education (~200 ft)   Stairs             Wheelchair Mobility    Modified Rankin (Stroke Patients Only) Modified Rankin (Stroke Patients Only) Pre-Morbid Rankin Score: Slight disability Modified Rankin: Moderately severe disability     Balance     Sitting balance-Leahy Scale: Good     Standing balance support: No upper extremity supported Standing balance-Leahy Scale: Fair                      Cognition Arousal/Alertness: Awake/alert Behavior During Therapy: WFL for tasks assessed/performed Overall Cognitive Status: Impaired/Different from baseline Area of Impairment: Memory     Memory: Decreased short-term memory     Awareness: Anticipatory   General Comments: aware of current deficits including memory being a bit worse; anticipated could roll off EOB and scooted laterally prior    Exercises      General Comments        Pertinent Vitals/Pain Pain Assessment: No/denies pain    Home Living                      Prior Function            PT Goals (current goals can now be found in the care plan section) Acute Rehab PT Goals Patient Stated Goal: return home soon (due to financial concerns) PT Goal Formulation: With patient Time For Goal Achievement: 06/01/14 Potential to Achieve Goals: Good Progress towards PT goals: Progressing toward goals    Frequency  Min 3X/week    PT Plan Discharge plan needs to be updated    Co-evaluation  End of Session   Activity Tolerance: Patient tolerated treatment well Patient left: with call bell/phone within reach;in bed;with bed alarm set     Time: [id number] PT Time Calculation (min) (ACUTE ONLY): 15 min  Charges:  $Therapeutic Activity: 8-22 mins                    G Codes:      Catori Panozzo 05/26/2014, 4:13 PM Pager [phone]

## 2014-05-27 ENCOUNTER — Observation Stay (HOSPITAL_COMMUNITY): Payer: Medicare Other

## 2014-05-27 DIAGNOSIS — I6521 Occlusion and stenosis of right carotid artery: Secondary | ICD-10-CM | POA: Diagnosis not present

## 2014-05-27 DIAGNOSIS — R531 Weakness: Secondary | ICD-10-CM | POA: Diagnosis not present

## 2014-05-27 DIAGNOSIS — I350 Nonrheumatic aortic (valve) stenosis: Secondary | ICD-10-CM | POA: Diagnosis not present

## 2014-05-27 DIAGNOSIS — R0789 Other chest pain: Secondary | ICD-10-CM | POA: Diagnosis not present

## 2014-05-27 DIAGNOSIS — R079 Chest pain, unspecified: Secondary | ICD-10-CM | POA: Diagnosis not present

## 2014-05-27 DIAGNOSIS — I10 Essential (primary) hypertension: Secondary | ICD-10-CM | POA: Diagnosis not present

## 2014-05-27 DIAGNOSIS — I69354 Hemiplegia and hemiparesis following cerebral infarction affecting left non-dominant side: Secondary | ICD-10-CM | POA: Diagnosis not present

## 2014-05-27 DIAGNOSIS — I480 Paroxysmal atrial fibrillation: Secondary | ICD-10-CM | POA: Diagnosis not present

## 2014-05-27 DIAGNOSIS — G459 Transient cerebral ischemic attack, unspecified: Secondary | ICD-10-CM

## 2014-05-27 LAB — BASIC METABOLIC PANEL
ANION GAP: 6 (ref 5–15)
BUN: 11 mg/dL (ref 6–23)
CALCIUM: 9.1 mg/dL (ref 8.4–10.5)
CO2: 29 mmol/L (ref 19–32)
Chloride: 104 mmol/L (ref 96–112)
Creatinine, Ser: 0.97 mg/dL (ref 0.50–1.35)
GFR calc Af Amer: >90 mL/min (ref >90)
GFR calc non Af Amer: 82 mL/min — ABNORMAL LOW (ref >90)
GLUCOSE: 116 mg/dL — AB (ref 70–99)
Potassium: 4.2 mmol/L (ref 3.5–5.1)
SODIUM: 139 mmol/L (ref 135–145)

## 2014-05-27 LAB — GLUCOSE, CAPILLARY
GLUCOSE-CAPILLARY: 96 mg/dL (ref 70–99)
Glucose-Capillary: 119 mg/dL — ABNORMAL HIGH (ref 70–99)
Glucose-Capillary: 103 mg/dL — ABNORMAL HIGH (ref 70–99)
Glucose-Capillary: 98 mg/dL (ref 70–99)

## 2014-05-27 MED ORDER — METOPROLOL TARTRATE 1 MG/ML IV SOLN
5.0000 mg | Freq: Once | INTRAVENOUS | Status: AC
Start: 2014-05-27 — End: 2014-05-27
  Administered 2014-05-27: 3 mg via INTRAVENOUS
  Filled 2014-05-27: qty 5

## 2014-05-27 NOTE — Progress Notes (Signed)
2 EKGs were performed. EKG did not load electronically in epic, put into pt chart. MD made aware.  Will continue to monitor.

## 2014-05-27 NOTE — Progress Notes (Signed)
Called per floor RN for patient with new neuro symptoms and elevated HR. Pt complaining of severe headache, light sensitivity and general "Not feeling right". RN advised to complete NIHSS and do EKG while en route to room. After quick chart review it was discovered pt does have a history of afib. EKG confirms he is currently in afib rate 110-130. Prior to my arrival floor RN assessed pt having slurred speech and facial droop along with new right sided weakness. Upon my assessment pt admitted to resolved head ache. Clear speech and no new weakness on right side assessed. See NIHSS flow sheet for details. Neurologist paged and updated on pt status, no orders at this time. Pt primary Resident  MD team paged an en route to room. Pt left resting,resolved symptoms and now currently back to baseline. HR 110-130s afib otherwise VSS. RN to monitor closely.

## 2014-05-27 NOTE — Progress Notes (Signed)
CCMD called me at around 1555 to inform me that [name] had gone into A-Fib while I was escorting him to the bathroom, pt. Is asymptomatic and Dr. Valentino Saxonothman was paged, awaiting his call

## 2014-05-27 NOTE — Progress Notes (Signed)
Dr. Evelena PeatAlex Wilson is being paged about Mr. Ey's A-fib

## 2014-05-27 NOTE — Progress Notes (Signed)
Subjective: [name] experienced heart palpitations that woke him from sleep overnight.  He denies chest pain but he says there was pounding in his neck and his heart was beating fast.  He has not experienced this again since last night.  He feels well today.  Appetite good, no change in bowel or bladder.    Objective: Vital signs in last 24 hours: Filed Vitals:   05/27/14 0214 05/27/14 0319 05/27/14 0331 05/27/14 0412  BP: 139/80 108/53 113/56 135/67  Pulse: 98 135 57 80  Temp: 97.5 F (36.4 C) 98.1 F (36.7 C)    TempSrc: Oral Oral    Resp:      Height:      Weight:      SpO2:       Weight change:   Intake/Output Summary (Last 24 hours) at 05/27/14 0808 Last data filed at 05/27/14 0300  Gross per 24 hour  Intake    480 ml  Output    600 ml  Net   -120 ml   Gen: A&O x 3, no acute distress, sitting up in chair HEENT: [place]/AT, pupils equal, EOMI Heart: Regular rate and rhythm, normal S1 S2, 1/6 systolic ejection murmur Lungs: Clear to auscultation bilaterally, respirations unlabored Abd: obese, Soft, non-tender, non-distended, + bowel sounds, no hepatosplenomegaly Ext: 1+ B/L pedal edema Neuro: A&O x 3, CN 3-12 grossly intact (subjectively decreased sensation on the left side of face and decreased hearing on left), strength 5/5 in RU and RLE but 4/5 in LU and LLE, decreased sensation on LUE and LLE  Lab Results: Basic Metabolic Panel:  Recent Labs Lab 05/26/14 0736 05/27/14 0635  NA 138 139  K 4.1 4.2  CL 105 104  CO2 25 29  GLUCOSE 122* 116*  BUN 12 11  CREATININE 0.96 0.97  CALCIUM 8.6 9.1   CBG:  Recent Labs Lab 05/25/14 2113 05/26/14 0640 05/26/14 1114 05/26/14 1617 05/26/14 2150 05/27/14 0634  GLUCAP 125* 119* 103* 96 119* 119*   TTE: - Left ventricle: The cavity size was normal. There was moderate concentric hypertrophy. Systolic function was normal. The estimated ejection fraction was in the range of 60% to 65%. Wall motion was  normal; there were no regional wall motion abnormalities. There was an increased relative contribution of atrial contraction to ventricular filling. Doppler parameters are consistent with abnormal left ventricular relaxation (grade 1 diastolic dysfunction).  Carotid duplex (prelim): There is 60-79% right ICA stenosis, lowest end of scale. The left CEA is patent. 1-39% left ICA stenosis. The right vertebral artery exhibits a "bunny sign" waveform. Left vertebral artery flow is antegrade.   Studies/Results: Ct Angio Neck W/cm &/or Wo/cm  05/26/2014   CLINICAL DATA:  Right carotid artery stenosis.  EXAM: CT ANGIOGRAPHY NECK  TECHNIQUE: Multidetector CT imaging of the neck was performed using the standard protocol during bolus administration of intravenous contrast. Multiplanar CT image reconstructions and MIPs were obtained to evaluate the vascular anatomy. Carotid stenosis measurements (when applicable) are obtained utilizing NASCET criteria, using the distal internal carotid diameter as the denominator.  CONTRAST:  80mL OMNIPAQUE IOHEXOL 350 MG/ML SOLN  COMPARISON:  MRA head 05/24/2014  FINDINGS: Aortic arch: A 3 vessel arch configuration is present. Minimal atherosclerotic calcifications are present at the aortic arch. There is no significant stenosis associated with the great vessel origins.  Right carotid system: The right common carotid artery is within normal limits. There is significant noncalcified soft tissue plaque at the proximal right internal carotid artery.  The area of narrowing spans 2.5 cm. The most focal areas stenosis is 1.5 cm from the bifurcation with the lumen is narrowed to 1.7 mm. This compares with a more distal lumen of 5.0 mm.  Left carotid system: The left common carotid artery is tortuous. Surgical clips are present. Changes of the proximal left internal carotid artery suggest carotid endarterectomy. There is irregularity within the distal anastomosis with a  blind-ending pouch extending 7 mm superiorly along the medial aspect of the distal anastomosis suggesting pseudoaneurysm formation. More distal left ICA is normal.  Vertebral arteries:The vertebral artery origins are within normal limits bilaterally. Mild tortuosity is evident. The left vertebral artery is dominant. There are no significant stenoses within the neck. The vertebrobasilar junction is visualized intracranial A. The PICA origins are visualized and normal. Mild atherosclerotic calcifications are present at the dural margins without significant stenosis.  Skeleton: Endplate degenerative changes are most evident at C6-7 with bilateral osseous foraminal stenosis. More mild endplate changes are noted at C3-4 and C5-6. The foramina are otherwise patent. No focal lytic or blastic lesions are evident.  Other neck: Limited imaging of the brain is unremarkable. The soft tissues of the neck are within normal limits. The thyroid is normal. No significant adenopathy is present. And lung apices demonstrate mild dependent atelectasis. No focal nodule, mass, or airspace disease is present. The superior mediastinum is unremarkable.  IMPRESSION: 1. Sixty- 65% stenosis of the right internal carotid artery with a long segment of atherosclerotic narrowing and noncalcified soft tissue plaque. 2. Left carotid endarterectomy. 3. Irregularity in a blind ending pouch of the distal anastomosis suggests pseudoaneurysm formation within the distal left carotid endarterectomy. There is no proximal stenosis. 4. Mild cervical spondylosis as described.   Electronically Signed   By: Marin Roberts M.D.   On: 05/26/2014 10:07   Dg Chest Port 1 View  05/27/2014   CLINICAL DATA:  Chest pain.  Near syncope.  EXAM: PORTABLE CHEST - 1 VIEW  COMPARISON:  05/24/2014  FINDINGS: There is a shallow inspiration. There is mild unchanged right hemidiaphragm elevation. Heart size is unchanged. There is mild basilar crowding associated with the  shallow inspiration. There is no consolidated airspace opacity. There is no large effusion.  IMPRESSION: No acute findings.   Electronically Signed   By: Ellery Plunk M.D.   On: 05/27/2014 03:59   Medications: I have reviewed the patient's current medications. Scheduled Meds: . aspirin  325 mg Oral Daily  . atorvastatin  40 mg Oral q1800  . heparin  5,000 Units Subcutaneous 3 times per day  . sertraline  100 mg Oral Daily   Continuous Infusions: . sodium chloride 75 mL/hr at 05/24/14 2255   PRN Meds:.acetaminophen, albuterol, senna-docusate   Assessment/Plan: Active Problems:   History of CEA (carotid endarterectomy)   Stroke-like symptoms   CAD (coronary artery disease)   TIA (transient ischemic attack)   HLD (hyperlipidemia)   Essential hypertension   Hemispheric carotid artery syndrome   Internal carotid artery stenosis  #TIA: Today's exam similar to yesterday with mildly decreased strength on the left and decreased sensation on left side of face and left extremities.  CTA neck confirmed carotid US findings.  There is 65% stenosis of the right ICA and question of pseudoaneurysm formation in the distal left carotid endarterectomy.  Vascular Surgery has evaluated the patient and are planning cerebral arteriogram on 02/23.  They feel this is necessary to better evaluate because the stenosis looks smooth and it is unclear  if it is definitely the source.  The arteriogram will also help evaluate the possible pseudoaneurysm on the left.  Appreciate Neurology and Vascular assistance with this patient.   -hold antihypertensives for permissive hypertension -continue ASA 325 mg po daily, Lipitor -continue PT/OT; HH PT/OT at discharge -continue cardiac monitoring -plan for cerebral arteriogram on 02/23 if otherwise medically stable -follow-up with Neurologist, Dr. Roda Shutters in 2 months  #Paroxysmal atrial fibrillation: He had an episode of irregular rhythm last night that was thought to be  afib, however there are pwaves present on the EKG (?MAT).  His rate improved after IV BB.  He is currently in NSR. There is question of prior bleed on coumadin (cardiology note mentions hematoma) vs allergy (coumadin is listed as an allergy).  He clearly remembers the events surrounding his carotid endarterectomy but does not seem to remember if and when he was on coumadin or reasons it may have been stopped.  CHADSVASc is 5 so he should be on anticoagulation.  He does have fall risk.  The patient has expressed desire to be on anticoagulation despite this risk.  I received records from his Cardiologist's (Dr. Deedra Ehrich) office which list PAF but there is no mention of anticoagulation.  His medication list at the visit (in July 2015) includes ASA and metoprolol.  There are no drug allergies listed. There is no mention of bleed or contraindications for anticoagulation in the records from PCP office.   -continue cardiac monitoring -will plan to start Eliquis after Tuesday's procedure - Vascular surgeon's note says it would be best to obtain the arteriogram before starting anticoagulation -since his BP is currently 102-126/73-76 will continue to hold lopressor to avoid hypotension given carotid stenosis described above -IV lopressor prn if he experiences RVR again  #Chest pressure: resolved.   -continue cardiac monitoring -albuterol 2.5 mg neb q4hprn  #HTN: currently normotensive.  BP medications initially held for permissive HTN.  At home he is on ASA 81 mg daily, lasix 40 mg daily, lopressor 75 mg daily, simvastatin 40 mg qhs.  -ASA 325 mg daily, atorvastatin 40 mg qhs -hold antihypertensives to avoid hypotension   #Chronic CHF: stable.  At home he is on ASA 81 mg daily, lasix 40 mg daily,lopressor 75 mg daily, simvastatin 40 mg qhs. -hold antihypertensives to avoid hypotension   #HL: Lipid panel this admission w cholesterol 147, LDL 80, HDL 33, and triglyceride 1569. At home he is on simvastatin 40  mg qhs.  D/c simvastatin and started atorvastatin this admission. -continue atorvastatin 40 mg qhs  #Depression: At home he takes sertraline 100 mg daily -cont sertraline 100 mg daily  #Diet: heart  #DVT PPx: heparin 5000 u [place] tid  #Code: Full  Dispo: Disposition is deferred at this time, awaiting improvement of current medical problems.  Anticipated discharge in approximately 3-4 day(s).  The patient does have a current PCP (Ailene Ravel, MD) and does need an [facility] hospital follow-up appointment after discharge.  The patient does not know have transportation limitations that hinder transportation to clinic appointments.  .Services Needed at time of discharge: Y = Yes, Blank = No PT: Y  OT: Y  RN:   Equipment: Rolling walker with 5" wheels  Other:     LOS: 3 days   Yolanda Manges, DO 05/27/2014, 8:08 AM

## 2014-05-27 NOTE — Progress Notes (Signed)
   VASCULAR SURGERY ASSESSMENT & PLAN:  * No new neuro symptoms.   *  Plan cerebral arteriogram Tuesday if medically ready. (Had Afib with RVR to 130's earlier today.)  * The indication for the cerebral Agram is that on his CT angiogram and his duplex and it appears that the stenosis on the right extends fairly high. However the stenosis only looks moderate and fairly smooth. Therefore I cannot be sure that this is responsible for his right brain event. For this reason I would recommend a cerebral arteriogram to further assess this. In addition,this patient has undergone a previous left carotid endarterectomy in the past. CT angiogram suggests a possible pseudoaneurysm at the distal aspect of his carotid endarterectomy site and arteriography would also allow us to evaluate this. In addition, he had an abnormal signal in his right vertebral artery and does have some history of dizziness. Cerebral arteriography would also allow us to evaluate this.   SUBJECTIVE: No complaints this AM  PHYSICAL EXAM: Filed Vitals:   05/27/14 0319 05/27/14 0331 05/27/14 0412 05/27/14 0922  BP: 108/53 113/56 135/67 102/76  Pulse: 135 57 80 84  Temp: 98.1 F (36.7 C)   98.1 F (36.7 C)  TempSrc: Oral   Oral  Resp:    20  Height:      Weight:      SpO2:    95%   Still with some Left UE and Left LE weakness  LABS: Lab Results  Component Value Date   WBC 7.2 05/26/2014   HGB 14.8 05/26/2014   HCT 44.4 05/26/2014   MCV 92.3 05/26/2014   PLT 219 05/26/2014   Lab Results  Component Value Date   CREATININE 0.97 05/27/2014   Lab Results  Component Value Date   INR 1.04 05/24/2014   CBG (last 3)   Recent Labs  05/26/14 1617 05/26/14 2150 05/27/14 0634  GLUCAP 96 119* 119*    Active Problems:   History of CEA (carotid endarterectomy)   Stroke-like symptoms   CAD (coronary artery disease)   TIA (transient ischemic attack)   HLD (hyperlipidemia)   Essential hypertension   Hemispheric  carotid artery syndrome   Internal carotid artery stenosis   Cari Carawayhris Dickson Beeper: [phone] 05/27/2014

## 2014-05-27 NOTE — Progress Notes (Signed)
  PROGRESS NOTE MEDICINE TEACHING ATTENDING   Day 3 of stay Patient name: Jorge [name]   Medical record number: [id number] Date of birth: 25-Nov-1945   Met with Mr Lohr. He appears to be comfortable and in no acute distress. Overnight events (Afib RVR with resolution) reviewed. Vitals: Blood pressure 102/76, pulse 84, temperature 98.1 F (36.7 C), temperature source Oral, resp. rate 20, height 6' (1.829 m), weight 347 lb 3.6 oz (157.5 kg), SpO2 95 %. Currently in normal sinus. No chest pain. Cardiac exam - regular rhythm, no murmurs. Neurological exam unchanged.   Patient awaiting neck arteriogram studies to be scheduled on Monday or Tuesday.  We await notes from his PCP's office to have a better idea about his questionable history of a hematoma as mentioned in [facility] cardiology notes to start him on anticoagulation. Weighing risk benefit - hemorrhage versus stroke. If we do not get convincing evidence of previous bleed, we will consider starting Eliquis 5 mg. We have discussed this with the patient and he is agreeable.   I have discussed the care of this patient with my IM team residents. Please see the resident note for details.  [place], Mims 05/27/2014, 10:30 AM.

## 2014-05-27 NOTE — Progress Notes (Signed)
Dr. Valentino Saxonothman paged 3 times about Mr. Mankins's A-fib, never returned my call, will page 2nd on-call doctor

## 2014-05-27 NOTE — Progress Notes (Signed)
Went to see patient after RN called saying patient was not feeling well and he was found to be in AFib with RVR rate 130's. Went to see patient. He stated he woke up from sleep feeling palpitations, blurry vision,  bad headache, tingling on his hands, and light sensitivity. EKG showed afib with RVR in 130's. He felt better when we saw him. He has his chronic chest pain in the mid sternal area. No other new symptoms.   Exam: His SBP was in 110-103's. He was still in afib when we were in there with rate 120's. HEENT: [place]/at, pupils round and reactive, eomi Lungs: CTAB Heart: irregularly irregular rhythm, no m/r/g Abd: obese, otherwise benign. Neuro: a&ox3, CN II-XII grossly intact. Had 4/5 strength on LUE, 5/5 everywhere else. Sensation intact.  A/p: Patient's symptoms of generalized malaise, headache, numbness all had improved when we saw him. He was likely experiencing discomfort from the palpitation from afib/rvr that woke him up. He feels better now. Given he was symptomatic and had afib RVR, we ordered 5mg  IV metoprolol with close BP monitoring.  - he converted back to NSR with rate 70's while nurse was pushing the metop, we asked her to stop after she pushed 3mg  IV.  - BP was ok after the metoprolol. We tried to avoid hypotension given his MCA stenosis. -CXR was obtained, shows some mild basilar crowding, otherwise normal. - he appears to be in NSR now. Continue monitoring patient.

## 2014-05-27 NOTE — Progress Notes (Signed)
UR completed 

## 2014-05-28 DIAGNOSIS — I480 Paroxysmal atrial fibrillation: Secondary | ICD-10-CM

## 2014-05-28 LAB — GLUCOSE, CAPILLARY
Glucose-Capillary: 132 mg/dL — ABNORMAL HIGH (ref 70–99)
Glucose-Capillary: 101 mg/dL — ABNORMAL HIGH (ref 70–99)
Glucose-Capillary: 103 mg/dL — ABNORMAL HIGH (ref 70–99)
Glucose-Capillary: 94 mg/dL (ref 70–99)

## 2014-05-28 MED ORDER — METOPROLOL TARTRATE 12.5 MG HALF TABLET
12.5000 mg | ORAL_TABLET | Freq: Two times a day (BID) | ORAL | Status: DC
  Administered 2014-05-28 – 2014-05-31 (×6): 12.5 mg via ORAL
  Filled 2014-05-28 (×8): qty 1

## 2014-05-28 NOTE — Progress Notes (Signed)
Utilization Review Completed.   Adamari Frede, RN, BSN Nurse Case Manager  

## 2014-05-28 NOTE — Progress Notes (Signed)
Utilization Review Completed.   Gabriellia Rempel, RN, BSN Nurse Case Manager  

## 2014-05-28 NOTE — Consult Note (Signed)
CARDIOLOGY CONSULT NOTE   Patient ID: Jorge [name] MRN: [id number], DOB/AGE: 1945/10/23   Admit date: 05/24/2014 Date of Consult: 05/28/2014   Primary Physician: Ailene Ravel, MD Primary Cardiologist: Dr. Eden Emms  Pt. Profile 69 year old gentleman admitted with TIA.  He is being considered for cerebral angiogram on 05/30/14.  Problem List  Past Medical History  Diagnosis Date  . HTN (hypertension)   . Hyperlipidemia   . Edema   . Chest pain   . SOB (shortness of breath)   . Neuropathy   . Stroke     Past Surgical History  Procedure Laterality Date  . Cardiac catheterization    . Back surgery       Allergies  Allergies  Allergen Reactions  . Coumadin [Warfarin Sodium] Rash    HPI   This 69 year old gentleman has a history of morbid obesity.  He currently weighs 347.  At one time he weighed 460 pounds.  He has a history of prior strokes and TIAs.  He has had a apparent history of previous paroxysmal atrial fibrillation but has never been on long-term anticoagulation.  He does not have any obvious contraindication to anticoagulation.  He was in normal sinus rhythm on admission 05/24/14.  An electrocardiogram done at [facility] on 05/27/14 shows coarse atrial fibrillation with controlled ventricular response. The patient states he has a past history of congestive heart failure.  He has a history of borderline diabetes and a history of essential hypertension.  He has not had myocardial infarction and his last nuclear stress test on 03/29/12 showed no evidence of ischemia or scar.  The patient does have exertional dyspnea secondary to his obesity  Inpatient Medications  . aspirin  325 mg Oral Daily  . atorvastatin  40 mg Oral q1800  . heparin  5,000 Units Subcutaneous 3 times per day  . metoprolol tartrate  12.5 mg Oral BID  . sertraline  100 mg Oral Daily    Family History No family history on file. patient thinks that his father may have had atrial fibrillation.    Social History History   Social History  . Marital Status: Divorced    Spouse Name: N/A  . Number of Children: N/A  . Years of Education: N/A   Occupational History  . Not on file.   Social History Main Topics  . Smoking status: Former Games developer  . Smokeless tobacco: Former Neurosurgeon    Quit date: 03/22/1996  . Alcohol Use: No  . Drug Use: No  . Sexual Activity: Not on file   Other Topics Concern  . Not on file   Social History Narrative     Review of Systems  General:  No chills, fever, night sweats or weight changes.  Cardiovascular:  No chest pain, dyspnea on exertion, edema, orthopnea, palpitations, paroxysmal nocturnal dyspnea. Dermatological: No rash, lesions/masses Respiratory: No cough, dyspnea Urologic: No hematuria, dysuria Abdominal:   No nausea, vomiting, diarrhea, bright red blood per rectum, melena, or hematemesis Neurologic:  No visual changes, recent TIA with transient left-sided arm weakness All other systems reviewed and are otherwise negative except as noted above.  Physical Exam  Blood pressure 116/83, pulse 73, temperature 98.1 F (36.7 C), temperature source Oral, resp. rate 18, height 6' (1.829 m), weight 347 lb 3.6 oz (157.5 kg), SpO2 95 %.  General: Pleasant, NAD.  Morbid obesity Psych: Normal affect. Neuro: Alert and oriented X 3. Moves all extremities spontaneously. HEENT: Normal  Neck: Left carotid endarterectomy scar.  There  is a moderate systolic bruit on the right. Lungs:  Resp regular and unlabored, CTA. Heart: RRR.  There is a grade 2/6 systolic ejection murmur at the base.  No diastolic murmur.  No gallop.  Rhythm is regular. Abdomen: Soft, non-tender, non-distended, BS + x 4.  Extremities: No clubbing, cyanosis.  Trace edema.Marland Kitchen DP/PT/Radials 2+ and equal bilaterally.  Labs   Recent Labs  05/25/14 1653  TROPONINI 0.04*   Lab Results  Component Value Date   WBC 7.2 05/26/2014   HGB 14.8 05/26/2014   HCT 44.4 05/26/2014   MCV  92.3 05/26/2014   PLT 219 05/26/2014    Recent Labs Lab 05/25/14 0844  05/27/14 0635  NA 139  < > 139  K 3.7  < > 4.2  CL 106  < > 104  CO2 26  < > 29  BUN 16  < > 11  CREATININE 0.97  < > 0.97  CALCIUM 8.6  < > 9.1  PROT 6.8  --   --   BILITOT 0.9  --   --   ALKPHOS 88  --   --   ALT 22  --   --   AST 22  --   --   GLUCOSE 174*  < > 116*  < > = values in this interval not displayed. Lab Results  Component Value Date   CHOL 147 05/25/2014   HDL 33* 05/25/2014   LDLCALC 80 05/25/2014   TRIG 169* 05/25/2014   No results found for: DDIMER  Radiology/Studies  Dg Chest 2 View  05/24/2014   CLINICAL DATA:  Stroke-like symptoms.  The patient fell today.  EXAM: CHEST  2 VIEW  COMPARISON:  05/06/2008 and 04/28/2008  FINDINGS: Heart size and pulmonary vascularity are normal and the lungs are clear. No osseous abnormality.  IMPRESSION: No active cardiopulmonary disease.   Electronically Signed   By: Francene Boyers M.D.   On: 05/24/2014 20:45   Ct Head (brain) Wo Contrast  05/24/2014   CLINICAL DATA:  Code stroke with left arm weakness.  EXAM: CT HEAD WITHOUT CONTRAST  TECHNIQUE: Contiguous axial images were obtained from the base of the skull through the vertex without intravenous contrast.  COMPARISON:  04/28/2008  FINDINGS: Skull and Sinuses:Negative for fracture or destructive process. The mastoids, middle ears, and imaged paranasal sinuses are clear.  Orbits: No acute abnormality.  Brain: No evidence of acute infarction, hemorrhage, hydrocephalus, or mass lesion/mass effect.  There is mild patchy bilateral cerebral white matter low density, likely chronic small vessel disease. There has been no significant progression from 2010. No asymmetric density of the central vessels.  These results were called by telephone at the time of interpretation on 05/24/2014 at 12:17 pm to Dr. Benjiman Core , who verbally acknowledged these results.  IMPRESSION: 1. No evidence of acute intracranial  disease. 2. Mild chronic small vessel ischemia.   Electronically Signed   By: Marnee Spring M.D.   On: 05/24/2014 12:18   Ct Angio Neck W/cm &/or Wo/cm  05/26/2014   CLINICAL DATA:  Right carotid artery stenosis.  EXAM: CT ANGIOGRAPHY NECK  TECHNIQUE: Multidetector CT imaging of the neck was performed using the standard protocol during bolus administration of intravenous contrast. Multiplanar CT image reconstructions and MIPs were obtained to evaluate the vascular anatomy. Carotid stenosis measurements (when applicable) are obtained utilizing NASCET criteria, using the distal internal carotid diameter as the denominator.  CONTRAST:  80mL OMNIPAQUE IOHEXOL 350 MG/ML SOLN  COMPARISON:  MRA  head 05/24/2014  FINDINGS: Aortic arch: A 3 vessel arch configuration is present. Minimal atherosclerotic calcifications are present at the aortic arch. There is no significant stenosis associated with the great vessel origins.  Right carotid system: The right common carotid artery is within normal limits. There is significant noncalcified soft tissue plaque at the proximal right internal carotid artery. The area of narrowing spans 2.5 cm. The most focal areas stenosis is 1.5 cm from the bifurcation with the lumen is narrowed to 1.7 mm. This compares with a more distal lumen of 5.0 mm.  Left carotid system: The left common carotid artery is tortuous. Surgical clips are present. Changes of the proximal left internal carotid artery suggest carotid endarterectomy. There is irregularity within the distal anastomosis with a blind-ending pouch extending 7 mm superiorly along the medial aspect of the distal anastomosis suggesting pseudoaneurysm formation. More distal left ICA is normal.  Vertebral arteries:The vertebral artery origins are within normal limits bilaterally. Mild tortuosity is evident. The left vertebral artery is dominant. There are no significant stenoses within the neck. The vertebrobasilar junction is visualized  intracranial A. The PICA origins are visualized and normal. Mild atherosclerotic calcifications are present at the dural margins without significant stenosis.  Skeleton: Endplate degenerative changes are most evident at C6-7 with bilateral osseous foraminal stenosis. More mild endplate changes are noted at C3-4 and C5-6. The foramina are otherwise patent. No focal lytic or blastic lesions are evident.  Other neck: Limited imaging of the brain is unremarkable. The soft tissues of the neck are within normal limits. The thyroid is normal. No significant adenopathy is present. And lung apices demonstrate mild dependent atelectasis. No focal nodule, mass, or airspace disease is present. The superior mediastinum is unremarkable.  IMPRESSION: 1. Sixty- 65% stenosis of the right internal carotid artery with a long segment of atherosclerotic narrowing and noncalcified soft tissue plaque. 2. Left carotid endarterectomy. 3. Irregularity in a blind ending pouch of the distal anastomosis suggests pseudoaneurysm formation within the distal left carotid endarterectomy. There is no proximal stenosis. 4. Mild cervical spondylosis as described.   Electronically Signed   By: Marin Roberts M.D.   On: 05/26/2014 10:07   Mr Brain Wo Contrast  05/24/2014   CLINICAL DATA:  69 year old male with left side weakness and loss of consciousness. Initial encounter.  EXAM: MRI HEAD WITHOUT CONTRAST  MRA HEAD WITHOUT CONTRAST  TECHNIQUE: Multiplanar, multiecho pulse sequences of the brain and surrounding structures were obtained without intravenous contrast. Angiographic images of the head were obtained using MRA technique without contrast.  COMPARISON:  Head CT without contrast 1212 hr today and earlier.  FINDINGS: MRI HEAD FINDINGS  Cerebral volume is within normal limits for age. No restricted diffusion to suggest acute infarction. No midline shift, mass effect, evidence of mass lesion, ventriculomegaly, extra-axial collection or acute  intracranial hemorrhage. Cervicomedullary junction and pituitary are within normal limits. Negative visualized cervical spine. Major intracranial vascular flow voids are preserved.  Scattered mild to moderate for age nonspecific cerebral white matter T2 and FLAIR hyperintensity. No cortical encephalomalacia identified. Deep gray matter nuclei are within normal limits. Mild to moderate patchy T2 hyperintensity in the pons. Cerebellum within normal limits.  Visible internal auditory structures appear normal. Visualized paranasal sinuses and mastoids are clear. Postoperative changes to the left globe. Negative right orbit soft tissues. Visualized scalp soft tissues are within normal limits. Normal bone marrow signal.  MRA HEAD FINDINGS  Antegrade flow in the posterior circulation. Dominant appearing distal left vertebral  artery. Both PICA origins are patent. Patent basilar artery without stenosis. Fetal type right PCA origin. SCA and left PCA origins are within normal limits. PCA branches are within normal limits. Left posterior communicating artery is diminutive or absent.  Antegrade flow in both ICA siphons. No siphon stenosis. Ophthalmic and right posterior communicating artery origins are normal. Normal carotid termini, MCA and ACA origins. Anterior communicating artery and visualized ACA branches are within normal limits. Visualized bilateral MCA branches are within normal limits.  IMPRESSION: 1. No acute intracranial abnormality. Mild to moderate for age nonspecific signal changes in the brain, most commonly due to chronic small vessel disease. 2.  Negative intracranial MRA.   Electronically Signed   By: Odessa FlemingH  Hall M.D.   On: 05/24/2014 18:19   Dg Chest Port 1 View  05/27/2014   CLINICAL DATA:  Chest pain.  Near syncope.  EXAM: PORTABLE CHEST - 1 VIEW  COMPARISON:  05/24/2014  FINDINGS: There is a shallow inspiration. There is mild unchanged right hemidiaphragm elevation. Heart size is unchanged. There is mild  basilar crowding associated with the shallow inspiration. There is no consolidated airspace opacity. There is no large effusion.  IMPRESSION: No acute findings.   Electronically Signed   By: Ellery Plunkaniel R Mitchell M.D.   On: 05/27/2014 03:59   Mr Maxine GlennMra Head/brain Wo Cm  05/24/2014   CLINICAL DATA:  69 year old male with left side weakness and loss of consciousness. Initial encounter.  EXAM: MRI HEAD WITHOUT CONTRAST  MRA HEAD WITHOUT CONTRAST  TECHNIQUE: Multiplanar, multiecho pulse sequences of the brain and surrounding structures were obtained without intravenous contrast. Angiographic images of the head were obtained using MRA technique without contrast.  COMPARISON:  Head CT without contrast 1212 hr today and earlier.  FINDINGS: MRI HEAD FINDINGS  Cerebral volume is within normal limits for age. No restricted diffusion to suggest acute infarction. No midline shift, mass effect, evidence of mass lesion, ventriculomegaly, extra-axial collection or acute intracranial hemorrhage. Cervicomedullary junction and pituitary are within normal limits. Negative visualized cervical spine. Major intracranial vascular flow voids are preserved.  Scattered mild to moderate for age nonspecific cerebral white matter T2 and FLAIR hyperintensity. No cortical encephalomalacia identified. Deep gray matter nuclei are within normal limits. Mild to moderate patchy T2 hyperintensity in the pons. Cerebellum within normal limits.  Visible internal auditory structures appear normal. Visualized paranasal sinuses and mastoids are clear. Postoperative changes to the left globe. Negative right orbit soft tissues. Visualized scalp soft tissues are within normal limits. Normal bone marrow signal.  MRA HEAD FINDINGS  Antegrade flow in the posterior circulation. Dominant appearing distal left vertebral artery. Both PICA origins are patent. Patent basilar artery without stenosis. Fetal type right PCA origin. SCA and left PCA origins are within normal  limits. PCA branches are within normal limits. Left posterior communicating artery is diminutive or absent.  Antegrade flow in both ICA siphons. No siphon stenosis. Ophthalmic and right posterior communicating artery origins are normal. Normal carotid termini, MCA and ACA origins. Anterior communicating artery and visualized ACA branches are within normal limits. Visualized bilateral MCA branches are within normal limits.  IMPRESSION: 1. No acute intracranial abnormality. Mild to moderate for age nonspecific signal changes in the brain, most commonly due to chronic small vessel disease. 2.  Negative intracranial MRA.   Electronically Signed   By: Odessa FlemingH  Hall M.D.   On: 05/24/2014 18:19    ECG  27-May-2014 02:36:44 [id number] ROUTINE RECORD Atrial fibrillation with rapid ventricular response  Left axis deviation Borderline lvh with repolarization abnormality Nonspecific ST abnormality Coarse atrial fibrillation, new since admission, reviewed by myself. ASSESSMENT AND PLAN  1.  Paroxysmal atrial fibrillation.  His Chads-vasc score is 5 or 6.  He would benefit from long-term anticoagulation.  Some of his previous TIA symptoms may have been related to paroxysmal atrial fibrillation.  We will plan to start Eliquis after his cerebral angiogram. 2.  Essential hypertension 3.  Carotid artery disease, status post left carotid endarterectomy. 4.  Hyperlipidemia 5.  Diastolic dysfunction.  EF 60-65% by echo  Recommendation: Okay to proceed with cerebral angiogram on 05/30/14 following which he will be discharged on Eliquis for his paroxysmal atrial fibrillation.  Signed, Cassell Clement, MD  05/28/2014, 2:33 PM

## 2014-05-28 NOTE — Progress Notes (Signed)
Subjective: [name] was noted to be atrial fibrillation yesterday afternoon. RN documented attempted pages weekday MDs. When RN contacted weekend on-call pager as instructed on patient summary, weekend cross-cover MD responded.   [name] said he had a good night. He noted some heart palpitations which have since resolved but denied any chest pain. He denied any new weakness and says it is unchanged. He says that he would prefer to go home with home health following his hospitalization. I told him we received a faxed record from cardiologist Dr Bonnielee HaffKenneth Wallmeyer in [place] as well as from PCP Dr Nathanial RancherHamrick and neither mention warfarin or anti-coagulation. He does not think he has actually been on warfarin and says he is familiar with it including INR checks because his dad used it until an unknown adverse reaction. He is agreeable with staying here until carotid work-up complete.  Objective: Vital signs in last 24 hours: Filed Vitals:   05/27/14 2142 05/28/14 0232 05/28/14 0556 05/28/14 0921  BP: 144/78 125/82 151/70 136/84  Pulse: 80 83 72 83  Temp: 98.6 F (37 C) 98.5 F (36.9 C) 97.7 F (36.5 C) 98.1 F (36.7 C)  TempSrc: Oral Oral Oral Oral  Resp: 19 20 18 20   Height:      Weight:      SpO2: 98% 98% 96% 95%   Weight change:   Intake/Output Summary (Last 24 hours) at 05/28/14 0930 Last data filed at 05/27/14 2130  Gross per 24 hour  Intake    240 ml  Output      0 ml  Net    240 ml   Gen: A&O x 3, no acute distress, sitting up in chair HEENT: [place]/AT, pupils equal, EOMI Heart: Regular rate and rhythm, normal S1 S2, 1/6 systolic ejection murmur Lungs: Clear to auscultation bilaterally, respirations unlabored Abd: obese, Soft, non-tender, non-distended, + bowel sounds, no hepatosplenomegaly Ext: 1+ B/L pedal edema Neuro: A&O x 3, CN 3-12 grossly intact (subjectively decreased sensation on the left side of face which is unchanged), strength 5/5 in RU and RLE but 4/5 in LU  and LLE, decreased sensation on LUE and LLE, b/l tremor on finger-nose-finger  Lab Results: Basic Metabolic Panel:  Recent Labs Lab 05/26/14 0736 05/27/14 0635  NA 138 139  K 4.1 4.2  CL 105 104  CO2 25 29  GLUCOSE 122* 116*  BUN 12 11  CREATININE 0.96 0.97  CALCIUM 8.6 9.1   CBG:  Recent Labs Lab 05/26/14 2150 05/27/14 0634 05/27/14 1121 05/27/14 1619 05/27/14 2141 05/28/14 0631  GLUCAP 119* 119* 96 103* 98 132*   TTE: - Left ventricle: The cavity size was normal. There was moderate concentric hypertrophy. Systolic function was normal. The estimated ejection fraction was in the range of 60% to 65%. Wall motion was normal; there were no regional wall motion abnormalities. There was an increased relative contribution of atrial contraction to ventricular filling. Doppler parameters are consistent with abnormal left ventricular relaxation (grade 1 diastolic dysfunction).  Carotid duplex (prelim): There is 60-79% right ICA stenosis, lowest end of scale. The left CEA is patent. 1-39% left ICA stenosis. The right vertebral artery exhibits a "bunny sign" waveform. Left vertebral artery flow is antegrade.   Studies/Results: Ct Angio Neck W/cm &/or Wo/cm  05/26/2014   CLINICAL DATA:  Right carotid artery stenosis.  EXAM: CT ANGIOGRAPHY NECK  TECHNIQUE: Multidetector CT imaging of the neck was performed using the standard protocol during bolus administration of intravenous contrast. Multiplanar CT image  reconstructions and MIPs were obtained to evaluate the vascular anatomy. Carotid stenosis measurements (when applicable) are obtained utilizing NASCET criteria, using the distal internal carotid diameter as the denominator.  CONTRAST:  80mL OMNIPAQUE IOHEXOL 350 MG/ML SOLN  COMPARISON:  MRA head 05/24/2014  FINDINGS: Aortic arch: A 3 vessel arch configuration is present. Minimal atherosclerotic calcifications are present at the aortic arch. There is no significant  stenosis associated with the great vessel origins.  Right carotid system: The right common carotid artery is within normal limits. There is significant noncalcified soft tissue plaque at the proximal right internal carotid artery. The area of narrowing spans 2.5 cm. The most focal areas stenosis is 1.5 cm from the bifurcation with the lumen is narrowed to 1.7 mm. This compares with a more distal lumen of 5.0 mm.  Left carotid system: The left common carotid artery is tortuous. Surgical clips are present. Changes of the proximal left internal carotid artery suggest carotid endarterectomy. There is irregularity within the distal anastomosis with a blind-ending pouch extending 7 mm superiorly along the medial aspect of the distal anastomosis suggesting pseudoaneurysm formation. More distal left ICA is normal.  Vertebral arteries:The vertebral artery origins are within normal limits bilaterally. Mild tortuosity is evident. The left vertebral artery is dominant. There are no significant stenoses within the neck. The vertebrobasilar junction is visualized intracranial A. The PICA origins are visualized and normal. Mild atherosclerotic calcifications are present at the dural margins without significant stenosis.  Skeleton: Endplate degenerative changes are most evident at C6-7 with bilateral osseous foraminal stenosis. More mild endplate changes are noted at C3-4 and C5-6. The foramina are otherwise patent. No focal lytic or blastic lesions are evident.  Other neck: Limited imaging of the brain is unremarkable. The soft tissues of the neck are within normal limits. The thyroid is normal. No significant adenopathy is present. And lung apices demonstrate mild dependent atelectasis. No focal nodule, mass, or airspace disease is present. The superior mediastinum is unremarkable.  IMPRESSION: 1. Sixty- 65% stenosis of the right internal carotid artery with a long segment of atherosclerotic narrowing and noncalcified soft tissue  plaque. 2. Left carotid endarterectomy. 3. Irregularity in a blind ending pouch of the distal anastomosis suggests pseudoaneurysm formation within the distal left carotid endarterectomy. There is no proximal stenosis. 4. Mild cervical spondylosis as described.   Electronically Signed   By: Marin Roberts M.D.   On: 05/26/2014 10:07   Dg Chest Port 1 View  05/27/2014   CLINICAL DATA:  Chest pain.  Near syncope.  EXAM: PORTABLE CHEST - 1 VIEW  COMPARISON:  05/24/2014  FINDINGS: There is a shallow inspiration. There is mild unchanged right hemidiaphragm elevation. Heart size is unchanged. There is mild basilar crowding associated with the shallow inspiration. There is no consolidated airspace opacity. There is no large effusion.  IMPRESSION: No acute findings.   Electronically Signed   By: Ellery Plunk M.D.   On: 05/27/2014 03:59   Medications: I have reviewed the patient's current medications. Scheduled Meds: . aspirin  325 mg Oral Daily  . atorvastatin  40 mg Oral q1800  . heparin  5,000 Units Subcutaneous 3 times per day  . metoprolol tartrate  12.5 mg Oral BID  . sertraline  100 mg Oral Daily   Continuous Infusions: . sodium chloride 75 mL/hr at 05/24/14 2255   PRN Meds:.acetaminophen, albuterol, senna-docusate   Assessment/Plan: Active Problems:   History of CEA (carotid endarterectomy)   Stroke-like symptoms   CAD (coronary artery  disease)   TIA (transient ischemic attack)   HLD (hyperlipidemia)   Essential hypertension   Hemispheric carotid artery syndrome   Internal carotid artery stenosis  #TIA: Exam remains unchanged with mildly decreased strength on the left and decreased sensation on left side of face and left extremities.  CTA neck confirmed carotid US findings.  There is 65% stenosis of the right ICA and question of pseudoaneurysm formation in the distal left carotid endarterectomy.  Vascular Surgery has evaluated the patient and are planning cerebral arteriogram on  02/23 pending cardiology clearance. They feel this is necessary to better evaluate because the stenosis looks smooth and it is unclear if it is definitely the source.  The arteriogram will also help evaluate the possible pseudoaneurysm on the left.   -appreciate neuro, VVS -consult cards re cardiac clearance for arteriogram -hold antihypertensives for permissive hypertension in setting of carotid stenosis other than metoprolol resumed at low dose (see below) -continue ASA 325 mg po daily, Lipitor -continue PT/OT; HH PT/OT at discharge -continue cardiac monitoring -plan for cerebral arteriogram on 02/23 if otherwise medically stable -follow-up with Neurologist, Dr. Roda Shutters in 2 months -dispo to home w home health  #Paroxysmal atrial fibrillation: He had an episode of irregular rhythm on 2/19 last night that was thought to be afib, however there are pwaves present on the EKG (?MAT).  His rate improved after IV BB.  Another episode yesterday afternoon. He is currently in NSR. There is question of prior bleed on coumadin (cardiology note mentions hematoma) vs allergy (coumadin is listed as an allergy).   CHADSVASc is 5 so he should be on anticoagulation.  He does have fall risk.  The patient has expressed desire to be on anticoagulation despite this risk.  I received records from his Cardiologist's (Dr. Deedra Ehrich) office which list PAF but there is no mention of anticoagulation.  His medication list at the visit (in July 2015) includes ASA and metoprolol.  There are no drug allergies listed. There is no mention of bleed or contraindications for anticoagulation in the records from PCP office.  He does not think he has actually been on warfarin and says he is familiar with it including INR checks because his dad used it until an unknown adverse reaction.  -continue cardiac monitoring -resume metoprolol at low dose (12.5 mg bid) for rate control as it was held for soft BP but now 120s-150s SBP -will plan to  start Eliquis 5 mg bid after Tuesday's procedure - Vascular surgeon's note says it would be best to obtain the arteriogram before starting anticoagulation -IV lopressor prn if he experiences RVR again  #Chest pressure: resolved.   -cardiology consult re arteriogram clearance -continue cardiac monitoring -albuterol 2.5 mg neb q4hprn  #HTN: BP 120s0150s/70s-80s overnight. BP medications initially held for permissive HTN.  At home he is on ASA 81 mg daily, lasix 40 mg daily, lopressor 75 mg daily, simvastatin 40 mg qhs.  -ASA 325 mg daily, atorvastatin 40 mg qhs -hold antihypertensives other than metoprolol to avoid hypotension in setting of carotid stenosis -resume metoprolol at low dose 12.5 mg bid  #Chronic CHF: stable.  TTE notable for EF 60-65% and grade 1 diastolic dysfunction. At home he is on ASA 81 mg daily, lasix 40 mg daily,lopressor 75 mg daily, simvastatin 40 mg qhs. -hold antihypertensives to avoid hypotension  -resume metoprolol 12.5 mg bid as noted above  #HL: Lipid panel this admission w cholesterol 147, LDL 80, HDL 33, and triglyceride 1569. At home he  is on simvastatin 40 mg qhs.  D/c simvastatin and started atorvastatin this admission. -continue atorvastatin 40 mg qhs  #Depression: At home he takes sertraline 100 mg daily -cont sertraline 100 mg daily  #Diet: heart  #DVT PPx: heparin 5000 u [place] tid  #Code: Full  Dispo: Disposition is deferred at this time, awaiting improvement of current medical problems.  Anticipated discharge in approximately 3day(s).  The patient does have a current PCP (Ailene Ravel, MD) and does need an [facility] hospital follow-up appointment after discharge.  The patient does not know have transportation limitations that hinder transportation to clinic appointments.  .Services Needed at time of discharge: Y = Yes, Blank = No PT: Y  OT: Y  RN:   Equipment: Rolling walker with 5" wheels  Other:     LOS: 4 days   Lorenda Hatchet,  MD 05/28/2014, 9:30 AM

## 2014-05-29 DIAGNOSIS — I480 Paroxysmal atrial fibrillation: Secondary | ICD-10-CM

## 2014-05-29 LAB — GLUCOSE, CAPILLARY
Glucose-Capillary: 110 mg/dL — ABNORMAL HIGH (ref 70–99)
Glucose-Capillary: 138 mg/dL — ABNORMAL HIGH (ref 70–99)
Glucose-Capillary: 92 mg/dL (ref 70–99)
Glucose-Capillary: 102 mg/dL — ABNORMAL HIGH (ref 70–99)

## 2014-05-29 NOTE — Progress Notes (Signed)
UR completed 

## 2014-05-29 NOTE — Progress Notes (Signed)
Subjective: NAEON. Jorge [name] had no complaints this morning. He thinks he is feeling stronger and is eager for his arteriogram. No new chest or neuro complaints. We received faxed records from Dr Bonnielee Haff in [place] as well as from PCP Dr Nathanial Rancher and neither mention warfarin or anti-coagulation.He agrees with starting eliquis pending results of arteriogram.   Objective: Vital signs in last 24 hours: Filed Vitals:   05/28/14 2120 05/29/14 0054 05/29/14 0650 05/29/14 0856  BP: 151/76 137/62 134/86 113/54  Pulse: 67 67 75 80  Temp: 98.3 F (36.8 C) 98.3 F (36.8 C) 98.1 F (36.7 C) 97.4 F (36.3 C)  TempSrc: Oral Oral Oral Oral  Resp: Height:      Weight:      SpO2: 96% 94% 94% 95%   Weight change:   Intake/Output Summary (Last 24 hours) at 05/29/14 0930 Last data filed at 05/29/14 0857  Gross per 24 hour  Intake    240 ml  Output      0 ml  Net    240 ml   Gen: A&O x 3, no acute distress, sitting up in chair HEENT: [place]/AT, pupils equal, EOMI Heart: Regular rate and rhythm, normal S1 S2, 1/6 systolic ejection murmur Lungs: Clear to auscultation bilaterally, respirations unlabored Abd: obese, Soft, non-tender, non-distended, + bowel sounds, no hepatosplenomegaly Ext: 1+ B/L pedal edema Neuro: A&O x 3, CN 3-12 grossly intact (subjectively decreased sensation on the left side of face which is unchanged), strength feels 5/5 and symmetric today in all extremities, b/l tremor on finger-nose-finger unchanged  Lab Results: Basic Metabolic Panel:  Recent Labs Lab 05/26/14 0736 05/27/14 0635  NA 138 139  K 4.1 4.2  CL 105 104  CO2 25 29  GLUCOSE 122* 116*  BUN 12 11  CREATININE 0.96 0.97  CALCIUM 8.6 9.1   CBG:  Recent Labs Lab 05/27/14 2141 05/28/14 0631 05/28/14 1106 05/28/14 1603 05/28/14 2117 05/29/14 0649  GLUCAP 98 132* 101* 103* 94 110*   TTE: - Left ventricle: The cavity size was normal. There was moderate concentric  hypertrophy. Systolic function was normal. The estimated ejection fraction was in the range of 60% to 65%. Wall motion was normal; there were no regional wall motion abnormalities. There was an increased relative contribution of atrial contraction to ventricular filling. Doppler parameters are consistent with abnormal left ventricular relaxation (grade 1 diastolic dysfunction).  Carotid duplex (prelim): There is 60-79% right ICA stenosis, lowest end of scale. The left CEA is patent. 1-39% left ICA stenosis. The right vertebral artery exhibits a "bunny sign" waveform. Left vertebral artery flow is antegrade.   Studies/Results: No results found. Medications: I have reviewed the patient's current medications. Scheduled Meds: . aspirin  325 mg Oral Daily  . atorvastatin  40 mg Oral q1800  . heparin  5,000 Units Subcutaneous 3 times per day  . metoprolol tartrate  12.5 mg Oral BID  . sertraline  100 mg Oral Daily   Continuous Infusions: . sodium chloride 75 mL/hr at 05/24/14 2255   PRN Meds:.acetaminophen, albuterol, senna-docusate   Assessment/Plan: Active Problems:   History of CEA (carotid endarterectomy)   Stroke-like symptoms   CAD (coronary artery disease)   TIA (transient ischemic attack)   HLD (hyperlipidemia)   Essential hypertension   Hemispheric carotid artery syndrome   Internal carotid artery stenosis   Paroxysmal atrial fibrillation  #TIA: Exam appears improved today as I appreciated symmetric and 5/5 strength in all extremities.  He still reported decreased sensation on left side of face and left extremities.  CTA neck confirmed carotid US findings. There is 65% stenosis of the right ICA and question of pseudoaneurysm formation in the distal left carotid endarterectomy.  Vascular Surgery has evaluated the patient and are planning cerebral arteriogram on 02/23 pending cardiology clearance which was obtained yesterday. They feel this is necessary to better  evaluate because the stenosis looks smooth and it is unclear if it is definitely the source.  The arteriogram will also help evaluate the possible pseudoaneurysm on the left.  Plan is to start eliquis following arteriogram if appropriate. Faxed records from Dr Bonnielee HaffKenneth Wallmeyer in [place] as well as from PCP Dr Nathanial RancherHamrick and neither mention warfarin or anti-coagulation and he would like anti-coagulation. -appreciate neuro, VVS, cards -hold antihypertensives for permissive hypertension in setting of carotid stenosis other than metoprolol resumed at low dose (see below) -continue ASA 325 mg po daily, Lipitor -continue PT/OT; HH PT/OT at discharge -continue cardiac monitoring -plan for cerebral arteriogram on 02/23 if otherwise medically stable -follow-up with Neurologist, Dr. Roda ShuttersXu in 2 months -dispo to home w home health  #Paroxysmal atrial fibrillation: Currently NSR.He had an episode of irregular rhythm on 2/19 last night that was thought to be afib, however there are pwaves present on the EKG (?MAT).  His rate improved after IV BB.  Another episode 2/20 afternoon.There is question of prior bleed on coumadin (cardiology note mentions hematoma) vs allergy (coumadin is listed as an allergy).   CHADSVASc is 5 so he should be on anticoagulation.  He does have fall risk.  The patient has expressed desire to be on anticoagulation despite this risk.  I received records from his Cardiologist's (Dr. Deedra EhrichWallmeyer's) office which list PAF but there is no mention of anticoagulation.  His medication list at the visit (in July 2015) includes ASA and metoprolol.  There are no drug allergies listed. There is no mention of bleed or contraindications for anticoagulation in the records from PCP office.  He does not think he has actually been on warfarin and says he is familiar with it including INR checks because his dad used it until an unknown adverse reaction.  -continue cardiac monitoring -cont metoprolol at low dose (12.5  mg bid) for rate control -will plan to start Eliquis 5 mg bid after Tuesday's procedure - Vascular surgeon's note says it would be best to obtain the arteriogram before starting anticoagulation -IV lopressor prn if he experiences RVR again  #Chest pressure: resolved.   -cardiology consult re arteriogram clearance -continue cardiac monitoring -albuterol 2.5 mg neb q4hprn  #HTN: BP 110s-130s/50s-70s overnight since resuming low dose metoprolol. BP medications initially held for permissive HTN.  At home he is on ASA 81 mg daily, lasix 40 mg daily, lopressor 75 mg daily, simvastatin 40 mg qhs.  -ASA 325 mg daily, atorvastatin 40 mg qhs -hold antihypertensives other than metoprolol to avoid hypotension in setting of carotid stenosis -cont metoprolol at low dose 12.5 mg bid  #Chronic CHF: stable. TTE notable for EF 60-65% and grade 1 diastolic dysfunction. At home he is on ASA 81 mg daily, lasix 40 mg daily,lopressor 75 mg daily, simvastatin 40 mg qhs. -hold antihypertensives to avoid hypotension  -cont metoprolol 12.5 mg bid as noted above  #HL: Lipid panel this admission w cholesterol 147, LDL 80, HDL 33, and triglyceride 1569. At home he is on simvastatin 40 mg qhs.  D/c simvastatin and started atorvastatin this admission. -continue atorvastatin 40 mg  qhs  #Depression: At home he takes sertraline 100 mg daily -cont sertraline 100 mg daily  #Diet: heart  #DVT PPx: heparin 5000 u [place] tid  #Code: Full  Dispo: Disposition is deferred at this time, awaiting improvement of current medical problems.  Anticipated discharge in approximately 3day(s).  The patient does have a current PCP (Ailene Ravel, MD) and does need an [facility] hospital follow-up appointment after discharge.  The patient does not know have transportation limitations that hinder transportation to clinic appointments.  .Services Needed at time of discharge: Y = Yes, Blank = No PT: Y  OT: Y  RN:   Equipment: Rolling walker  with 5" wheels  Other:     LOS: 5 days   Lorenda Hatchet, MD 05/29/2014, 9:30 AM

## 2014-05-29 NOTE — Progress Notes (Signed)
Talked to patient about HHC choices, patient chose [facility] for HHPT/ OT; Miranda with [facility] called for arrangements; Alexis GoodellB Chazz Philson RN,BSN,MHA [phone]

## 2014-05-29 NOTE — Progress Notes (Signed)
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  He would continue to benefit from using RW full time and HEP given for LE seated strengthening routine for him to preform while here in the hospital and at home multiple times per day.  Pt with significantly decreased endurance and activity tolerance.  He would continue to benefit from HHPT at discharge.   Follow Up Recommendations  Home health PT     Equipment Recommendations  Rolling walker with 5" wheels (due to rollator too wide/big for his home. )    Recommendations for Other Services   NA     Precautions / Restrictions Precautions Precautions: Fall Precaution Comments: pt with h/o falls including this admission.     Mobility     Transfers Overall transfer level: Needs assistance Equipment used: Rolling walker (2 wheeled) Transfers: Sit to/from Stand Sit to Stand: Modified independent (Device/Increase time)         General transfer comment: Pt using arms for transitions, but able to do so safely.   Ambulation/Gait Ambulation/Gait assistance: Min guard Ambulation Distance (Feet): 150 Feet Assistive device: Rolling walker (2 wheeled) Gait Pattern/deviations: Step-through pattern;Shuffle Gait velocity: decreased Gait velocity interpretation: Below normal speed for age/gender General Gait Details: Pt with poor walking endurance.  No obvious signs of buckling in his left leg, however, pt self reporting that it is heavier than the right leg.  He has significant endurance deficits,  needing two standing rest breaks during short distance ambulation and sweating profusely during effort.            Balance Overall balance assessment: Needs assistance Sitting-balance support: Feet supported;No upper extremity supported Sitting balance-Leahy Scale: Good     Standing balance support: Bilateral upper extremity supported;No upper extremity supported;Single extremity supported Standing balance-Leahy Scale: Fair                      Cognition Arousal/Alertness: Awake/alert Behavior During Therapy: WFL for tasks assessed/performed Overall Cognitive Status: Impaired/Different from baseline Area of Impairment: Memory;Problem solving     Memory: Decreased short-term memory       Problem Solving: Slow processing General Comments: Pt needs extra time to answer detailed history questions at times, but does seem to, with extra time, accurately retrieve the information.     Exercises General Exercises - Lower Extremity Long Arc Quad: AROM;Both;10 reps;Seated (reported left leg heavier with this exercise. ) Hip ABduction/ADduction: AROM;Both;10 reps;Seated (adduct only against pillow for resistance. ) Hip Flexion/Marching: AROM;Both;10 reps;Seated Toe Raises: AROM;Both;10 reps;Seated Heel Raises: AROM;Both;10 reps;Seated        Pertinent Vitals/Pain Pain Assessment: No/denies pain           PT Goals (current goals can now be found in the care plan section) Acute Rehab PT Goals Patient Stated Goal: return home soon (due to financial concerns) Progress towards PT goals: Progressing toward goals    Frequency  Min 3X/week    PT Plan Current plan remains appropriate       End of Session  Activity Tolerance: Patient limited by fatigue Patient left: in chair;with call bell/phone within reach     Time: 1212-1234 PT Time Calculation (min) (ACUTE ONLY): 22 min  Charges:  $Gait Training: 8-22 mins                      Jorge [name], PT,  DPT [phone]   05/29/2014, 12:43 PM

## 2014-05-30 ENCOUNTER — Encounter (HOSPITAL_COMMUNITY)
Admission: EM | Disposition: A | Discharge status: Home / Self Care | Payer: Self-pay | Source: Home / Self Care | Attending: Emergency Medicine

## 2014-05-30 ENCOUNTER — Encounter (HOSPITAL_COMMUNITY): Payer: Self-pay | Admitting: Surgery

## 2014-05-30 DIAGNOSIS — I5032 Chronic diastolic (congestive) heart failure: Secondary | ICD-10-CM | POA: Diagnosis not present

## 2014-05-30 DIAGNOSIS — Z7901 Long term (current) use of anticoagulants: Secondary | ICD-10-CM | POA: Diagnosis not present

## 2014-05-30 DIAGNOSIS — I480 Paroxysmal atrial fibrillation: Secondary | ICD-10-CM | POA: Diagnosis not present

## 2014-05-30 DIAGNOSIS — I6521 Occlusion and stenosis of right carotid artery: Secondary | ICD-10-CM | POA: Diagnosis not present

## 2014-05-30 DIAGNOSIS — G459 Transient cerebral ischemic attack, unspecified: Secondary | ICD-10-CM | POA: Diagnosis not present

## 2014-05-30 DIAGNOSIS — I10 Essential (primary) hypertension: Secondary | ICD-10-CM | POA: Diagnosis not present

## 2014-05-30 HISTORY — PX: CAROTID ANGIOGRAM: SHX5504

## 2014-05-30 LAB — CBC
HCT: 44.9 % (ref 39.0–52.0)
Hemoglobin: 14.9 g/dL (ref 13.0–17.0)
MCH: 30.9 pg (ref 26.0–34.0)
MCHC: 33.2 g/dL (ref 30.0–36.0)
MCV: 93.2 fL (ref 78.0–100.0)
Platelets: 218 10*3/uL (ref 150–400)
RBC: 4.82 MIL/uL (ref 4.22–5.81)
RDW: 14.1 % (ref 11.5–15.5)
WBC: 7.8 10*3/uL (ref 4.0–10.5)

## 2014-05-30 LAB — GLUCOSE, CAPILLARY
GLUCOSE-CAPILLARY: 107 mg/dL — AB (ref 70–99)
Glucose-Capillary: 121 mg/dL — ABNORMAL HIGH (ref 70–99)
Glucose-Capillary: 94 mg/dL (ref 70–99)

## 2014-05-30 LAB — BASIC METABOLIC PANEL
Anion gap: 6 (ref 5–15)
BUN: 14 mg/dL (ref 6–23)
CALCIUM: 8.8 mg/dL (ref 8.4–10.5)
CO2: 30 mmol/L (ref 19–32)
Chloride: 102 mmol/L (ref 96–112)
Creatinine, Ser: 1.15 mg/dL (ref 0.50–1.35)
GFR calc Af Amer: 73 mL/min — ABNORMAL LOW (ref >90)
GFR, EST NON AFRICAN AMERICAN: 63 mL/min — AB (ref >90)
Glucose, Bld: 116 mg/dL — ABNORMAL HIGH (ref 70–99)
Potassium: 4.1 mmol/L (ref 3.5–5.1)
SODIUM: 138 mmol/L (ref 135–145)

## 2014-05-30 SURGERY — CAROTID ANGIOGRAM

## 2014-05-30 MED ORDER — SODIUM CHLORIDE 0.9 % IV SOLN
INTRAVENOUS | Status: DC
  Administered 2014-05-30: 06:00:00 via INTRAVENOUS

## 2014-05-30 MED ORDER — METOPROLOL TARTRATE 1 MG/ML IV SOLN: 2.0000 mg | INTRAVENOUS | Status: DC | PRN

## 2014-05-30 MED ORDER — HYDRALAZINE HCL 20 MG/ML IJ SOLN: 5.0000 mg | INTRAMUSCULAR | Status: DC | PRN

## 2014-05-30 MED ORDER — ACETAMINOPHEN 650 MG RE SUPP: 325.0000 mg | RECTAL | Status: DC | PRN

## 2014-05-30 MED ORDER — PHENOL 1.4 % MT LIQD
1.0000 | OROMUCOSAL | Status: DC | PRN
  Filled 2014-05-30: qty 177

## 2014-05-30 MED ORDER — ALUM & MAG HYDROXIDE-SIMETH 200-200-20 MG/5ML PO SUSP: 15.0000 mL | ORAL | Status: DC | PRN

## 2014-05-30 MED ORDER — OXYCODONE-ACETAMINOPHEN 5-325 MG PO TABS
ORAL_TABLET | ORAL | Status: AC
  Filled 2014-05-30: qty 2

## 2014-05-30 MED ORDER — ONDANSETRON HCL 4 MG/2ML IJ SOLN: 4.0000 mg | Freq: Four times a day (QID) | INTRAMUSCULAR | Status: DC | PRN

## 2014-05-30 MED ORDER — ACETAMINOPHEN 325 MG PO TABS: 325.0000 mg | ORAL_TABLET | ORAL | Status: DC | PRN

## 2014-05-30 MED ORDER — SODIUM CHLORIDE 0.9 % IV SOLN: INTRAVENOUS | Status: DC

## 2014-05-30 MED ORDER — OXYCODONE-ACETAMINOPHEN 5-325 MG PO TABS
1.0000 | ORAL_TABLET | Freq: Four times a day (QID) | ORAL | Status: DC | PRN
  Administered 2014-05-30 (×2): 2 via ORAL
  Filled 2014-05-30: qty 2

## 2014-05-30 MED ORDER — LIDOCAINE HCL (PF) 1 % IJ SOLN
INTRAMUSCULAR | Status: AC
  Filled 2014-05-30: qty 30

## 2014-05-30 MED ORDER — APIXABAN 5 MG PO TABS
5.0000 mg | ORAL_TABLET | Freq: Two times a day (BID) | ORAL | Status: DC
  Administered 2014-05-30 – 2014-05-31 (×3): 5 mg via ORAL
  Filled 2014-05-30 (×4): qty 1

## 2014-05-30 MED ORDER — LABETALOL HCL 5 MG/ML IV SOLN
10.0000 mg | INTRAVENOUS | Status: DC | PRN
  Filled 2014-05-30: qty 4

## 2014-05-30 MED ORDER — GUAIFENESIN-DM 100-10 MG/5ML PO SYRP: 15.0000 mL | ORAL_SOLUTION | ORAL | Status: DC | PRN

## 2014-05-30 MED ORDER — HEPARIN (PORCINE) IN NACL 2-0.9 UNIT/ML-% IJ SOLN
INTRAMUSCULAR | Status: AC
  Filled 2014-05-30: qty 1000

## 2014-05-30 NOTE — H&P (View-Only) (Signed)
 Vascular and Vein Specialist of   Patient name: Jorge [name] MRN: [id number] DOB: 06/25/1945 Sex: male  REASON FOR CONSULT: Symptomatic carotid disease  HPI: Jorge [name] is a 69 y.o. male who was admitted on 05/24/2014 with left-sided weakness and loss of consciousness. He awoke on the morning of admission with a mild headache. He was subsequently outside with his dog and blacked out. When he awoke he noticed increased heaviness in his left arm and leg with paresthesias. He states that his left-sided weakness has improved but has not resolved completely. He denies any associated expressive or receptive aphasia. He denies any history of amaurosis fugax.  He states that he underwent a left carotid endarterectomy in 2013 by Dr. Fields but I cannot find these records. He states that he had a stroke prior to his carotid endarterectomy associated with right-sided weakness. Postoperatively he apparently had seizures.  He does have a history of atrial fibrillation but cannot take Coumadin as he does not tolerate this. He thinks he is allergic.  Past Medical History  Diagnosis Date  . HTN (hypertension)   . Hyperlipidemia   . Edema   . Chest pain   . SOB (shortness of breath)   . Neuropathy   . Stroke     No family history on file. he denies any family history of premature cardiovascular disease.  SOCIAL HISTORY: History  Substance Use Topics  . Smoking status: Former Smoker  . Smokeless tobacco: Former User    Quit date: 03/22/1996  . Alcohol Use: No  He is divorced. He has 3 sons. He quit tobacco 40 years ago.  Allergies  Allergen Reactions  . Coumadin [Warfarin Sodium] Rash    Current Facility-Administered Medications  Medication Dose Route Frequency Provider Last Rate Last Dose  . 0.9 %  sodium chloride infusion   Intravenous Continuous Kathryn F Glenn, MD 75 mL/hr at 05/24/14 2255    . acetaminophen (TYLENOL) tablet 650 mg  650 mg Oral Q6H PRN Monica E  Karlen, RN   650 mg at 05/26/14 0548  . albuterol (PROVENTIL) (2.5 MG/3ML) 0.083% nebulizer solution 2.5 mg  2.5 mg Nebulization Q4H PRN Tasrif Ahmed, MD      . aspirin tablet 325 mg  325 mg Oral Daily Kathryn F Glenn, MD   325 mg at 05/26/14 1032  . atorvastatin (LIPITOR) tablet 40 mg  40 mg Oral q1800 Adam L Rothman, MD      . heparin injection 5,000 Units  5,000 Units Subcutaneous 3 times per day Kathryn F Glenn, MD   5,000 Units at 05/26/14 0538  . senna-docusate (Senokot-S) tablet 1 tablet  1 tablet Oral QHS PRN Kathryn F Glenn, MD      . sertraline (ZOLOFT) tablet 100 mg  100 mg Oral Daily Adam L Rothman, MD   100 mg at 05/26/14 1032    REVIEW OF SYSTEMS: [X ] denotes positive finding; [  ] denotes negative finding CARDIOVASCULAR:  [X ] chest pain   [ ] chest pressure   [ ] palpitations   [X ] orthopnea   [X ] dyspnea on exertion   [X ] claudication   [ ] rest pain   [ ] DVT   [ ] phlebitis PULMONARY:   [ ] productive cough   [ ] asthma   [ ] wheezing NEUROLOGIC:   [X ] weakness  [X ] paresthesias  [ ] aphasia  [ ] amaurosis  [X ] dizziness HEMATOLOGIC:   [ ]   bleeding problems   [ ] clotting disorders MUSCULOSKELETAL:  [X ] joint pain   [ ] joint swelling [ ] leg swelling GASTROINTESTINAL: [ ]  blood in stool  [ ]  hematemesis GENITOURINARY:  [ ]  dysuria  [ ]  hematuria PSYCHIATRIC:  [ ] history of major depression INTEGUMENTARY:  [ ] rashes  [ ] ulcers CONSTITUTIONAL:  [ ] fever   [ ] chills  PHYSICAL EXAM: Filed Vitals:   05/25/14 2110 05/26/14 0149 05/26/14 0549 05/26/14 1011  BP: 150/73 141/84 145/74 136/57  Pulse: 75 79 79 76  Temp: 97.8 F (36.6 C) 97.8 F (36.6 C) 97.7 F (36.5 C) 97.9 F (36.6 C)  TempSrc: Oral Oral Oral Oral  Resp: 20 18 22 20  Height:      Weight:      SpO2: 93% 97% 94% 98%   Body mass index is 47.08 kg/(m^2). GENERAL: The patient is a well-nourished male, in no acute distress. The vital signs are documented above. CARDIOVASCULAR: There is a  regular rate and rhythm. He has a systolic murmur. He has bilateral carotid bruits. He has palpable femoral pulses and palpable dorsalis pedis pulses bilaterally. PULMONARY: There is good air exchange bilaterally without wheezing or rales. ABDOMEN: Soft and non-tender with normal pitched bowel sounds. I am unable to assess for an aneurysm because of his size. MUSCULOSKELETAL: There are no major deformities or cyanosis. NEUROLOGIC: he has mild left upper extremity and left lower extremity weakness. SKIN: There are no ulcers or rashes noted. PSYCHIATRIC: The patient has a normal affect.  DATA:  Lab Results  Component Value Date   WBC 7.2 05/26/2014   HGB 14.8 05/26/2014   HCT 44.4 05/26/2014   MCV 92.3 05/26/2014   PLT 219 05/26/2014   Lab Results  Component Value Date   NA 138 05/26/2014   K 4.1 05/26/2014   CL 105 05/26/2014   CO2 25 05/26/2014   Lab Results  Component Value Date   CREATININE 0.96 05/26/2014   Lab Results  Component Value Date   INR 1.04 05/24/2014   Lab Results  Component Value Date   HGBA1C 5.7* 05/25/2014   CBG (last 3)   Recent Labs  05/25/14 2113 05/26/14 0640 05/26/14 1114  GLUCAP 125* 119* 103*   ECHO: ejection fraction is estimated at 60-65%. No evidence of aortic stenosis was noted.  MRI shows no evidence of acute intracranial abnormality.  CAROTID DUPLEX: Carotid duplex scan yesterday shows a 60-79% right carotid stenosis in the lower end of that range. There is no evidence of recurrent carotid stenosis on the left where he has undergone previous left carotid endarterectomy.  CT ANGIOGRAM OF THE NECK: I have reviewed his CT angiogram of the neck. This shows a 60-65% stenosis of the right internal carotid artery over a fairly long segment. There appears to be a small pseudoaneurysm at the distal aspect of his left carotid endarterectomy site.  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 He has a moderate 60-70% right carotid stenosis. I have reviewed his CT angiogram and his duplex and it appears that the stenosis extends fairly high. However the stenosis only looks moderate and fairly smooth. Therefore I cannot be sure that this is responsible for his right brain event. For this reason I would recommend a cerebral arteriogram to further assess this. In addition,this patient has   undergone a previous left carotid endarterectomy in the past. CT angiogram suggests a possible pseudoaneurysm at the distal aspect of his carotid endarterectomy site and arteriography would also allow us to evaluate this. In addition, he had an abnormal signal in his right vertebral artery and does have some history of dizziness. Cerebral arteriography would also allow us to evaluate this. He is being considered for anticoagulation given his history of paroxysmal atrial fibrillation and would probably be best to obtain his arteriogram before starting this. In addition he has significant cardiac disease with a history of congestive heart failure. He also had chest pain this admission. If his cerebral arteriogram suggested that he might benefit from carotid endarterectomy or carotid stenting than he would need preoperative cardiac evaluation. Currently he is on aspirin and is on a statin. Although he notes some kidney issues in the past his creatinine is normal currently. We could potentially schedule him for cerebral arteriography Monday or Tuesday of next week.  Vicki Chaffin S [facility] [place] Beeper: [phone]  Addendum: Our office is found his previous op note. He had a left carotid endarterectomy by Dr. Fields in 2004.   

## 2014-05-30 NOTE — Progress Notes (Signed)
Nurse delivered pt's personal items to new location. Confirmed with pt that phone, charger, and eyeglasses were in bag.   Andrew AuVafiadis, Vasilis Luhman I 05/30/2014 4:15 PM

## 2014-05-30 NOTE — Progress Notes (Signed)
Physical Therapy Addendum to Evaluation for G-codes    05/25/14 0927  PT G-Codes **NOT FOR INPATIENT CLASS**  Functional Assessment Tool Used clinical judgement  Functional Limitation Mobility: Walking and moving around  Mobility: Walking and Moving Around Current Status [phone](G8978) CJ  Mobility: Walking and Moving Around Goal Status [phone](G8979) CI    05/30/2014 Veda CanningLynn Lucky Alverson, PT Pager: [phone]

## 2014-05-30 NOTE — Interval H&P Note (Signed)
History and Physical Interval Note:  05/30/2014 7:51 AM  Jorge DroughtGeorge O Willmore  has presented today for surgery, with the diagnosis of CAROTID DISEASE/RIGHT BRAIN STROKE  The various methods of treatment have been discussed with the patient and family. After consideration of risks, benefits and other options for treatment, the patient has consented to  Procedure(s): CERBRAL  ANGIOGRAM (N/A) as a surgical intervention .  The patient's history has been reviewed, patient examined, no change in status, stable for surgery.  I have reviewed the patient's chart and labs.  Questions were answered to the patient's satisfaction.     Diany Formosa IV, V. WELLS

## 2014-05-30 NOTE — Progress Notes (Signed)
IVF completed. Saline locked.Neuro assessment unchanged.

## 2014-05-30 NOTE — Progress Notes (Signed)
Neuro assessment: alert and oriented. Speech clear and appropriate. Smile symmetrical. Rt pupil round reactive to light. Lt pupil irregular(prior injury). Hand grips equal in strength. Lt leg weaker than rt--(patient states he uses a cane). Tongue midline.

## 2014-05-30 NOTE — Progress Notes (Signed)
Occupational Therapy Cancellation:  Pt cancelled due to procedure, and subsequent bedrest.  Will reattempt   Jeani HawkingWendi Brindley Madarang, OTR/L [phone]

## 2014-05-30 NOTE — Progress Notes (Signed)
Site area: rt groin Site Prior to Removal:  Level 0; small skin tear at crease prior to pull Pressure Applied For: 20 minutes Manual:   yes Patient Status During Pull:  stable Post Pull Site:  Level  0 Post Pull Instructions Given:  yes Post Pull Pulses Present: yes Dressing Applied:  tegaderm Bedrest begins @ 1000 Comments: 0 complications

## 2014-05-30 NOTE — Progress Notes (Signed)
Subjective: Jorge [name] tolerated his arteriogram well this morning. I saw him after his procedure and he was feeling well. No new complaints. I explained that the narrowing of his R ICA was not as severe as on CTA and no pseudoaneurysm and VVS did not pursue further intervention. He agreed to start anti-coagulation with eliquis.   Objective: Vital signs in last 24 hours: Filed Vitals:   05/30/14 1240 05/30/14 1250 05/30/14 1350 05/30/14 1431  BP: 108/41 94/41 121/67 138/53  Pulse: 83 70 69 70  Temp:    98.1 F (36.7 C)  TempSrc:    Oral  Resp: Height:      Weight:      SpO2: 91% 90% 97% 97%   Weight change:  No intake or output data in the 24 hours ending 05/30/14 1501 Gen: A&O x 3, no acute distress, laying in bed HEENT: [place]/AT, post-surgical pupil OS, OS brown iris OD blue iris, EOMI Heart: Regular rate and rhythm, normal S1 S2, 1/6 systolic ejection murmur Lungs: Clear to auscultation bilaterally, respirations unlabored Abd: obese, Soft, non-tender, non-distended, + bowel sounds, no hepatosplenomegaly Ext: 1+ B/L pedal edema Neuro: A&O x 3, CN 3-12 grossly intact (subjectively decreased sensation on the left side of face which is unchanged), strength feels 5/5 and symmetricin all extremities, b/l tremor on finger-nose-finger unchanged  Lab Results: Basic Metabolic Panel:  Recent Labs Lab 05/27/14 0635 05/30/14 0459  NA 139 138  K 4.2 4.1  CL 104 102  CO2 29 30  GLUCOSE 116* 116*  BUN 11 14  CREATININE 0.97 1.15  CALCIUM 9.1 8.8   CBG:  Recent Labs Lab 05/29/14 0649 05/29/14 1109 05/29/14 1609 05/29/14 2052 05/30/14 0708 05/30/14 1002  GLUCAP 110* 138* 92 102* 121* 107*   TTE: - Left ventricle: The cavity size was normal. There was moderate concentric hypertrophy. Systolic function was normal. The estimated ejection fraction was in the range of 60% to 65%. Wall motion was normal; there were no regional wall motion abnormalities.  There was an increased relative contribution of atrial contraction to ventricular filling. Doppler parameters are consistent with abnormal left ventricular relaxation (grade 1 diastolic dysfunction).  Carotid duplex (prelim): There is 60-79% right ICA stenosis, lowest end of scale. The left CEA is patent. 1-39% left ICA stenosis. The right vertebral artery exhibits a "bunny sign" waveform. Left vertebral artery flow is antegrade.   Studies/Results: No results found. Medications: I have reviewed the patient's current medications. Scheduled Meds: . apixaban  5 mg Oral BID  . aspirin  325 mg Oral Daily  . atorvastatin  40 mg Oral q1800  . metoprolol tartrate  12.5 mg Oral BID  . sertraline  100 mg Oral Daily   Continuous Infusions: . sodium chloride 75 mL/hr at 05/24/14 2255  . sodium chloride 75 mL/hr at 05/30/14 1006   PRN Meds:.acetaminophen **OR** acetaminophen, albuterol, alum & mag hydroxide-simeth, guaiFENesin-dextromethorphan, hydrALAZINE, labetalol, metoprolol, ondansetron, oxyCODONE-acetaminophen, phenol, senna-docusate   Assessment/Plan: Active Problems:   History of CEA (carotid endarterectomy)   Stroke-like symptoms   CAD (coronary artery disease)   TIA (transient ischemic attack)   HLD (hyperlipidemia)   Essential hypertension   Hemispheric carotid artery syndrome   Internal carotid artery stenosis   Paroxysmal atrial fibrillation  #TIA: Jorge Hearne had arteriogram today which noted promxially 50% stenosis and slight irregularlity of R ICA but no significant vertebral artery disease. There was also no pseudoaneurysm of the distal left carotid patch as suggested  on neck CTA. No further intervention by VVS who recommends follow-up with Dr Fabienne Brunsharles Fields in 6 months which they will arrange. His exam is unchagned with decreased cranial nerve V sensation on L face but strength symmetric. He will begin eliquis today as VVS has cleared him and faxed records from [place]  Bonnie[place]aff[place]eth [place] as well as from PCP Dr Nathanial RancherHamrick and neither mention warfarin or anti-coagulation and he would like anti-coagulation. -appreciate neuro, VVS, cards -continue ASA 325 mg po daily, Lipitor 40 mg daily -eliquis 5 mg bid for a fib -continue PT/OT; HH PT/OT at discharge -continue cardiac monitoring -follow-up with Neurologist, Dr. Roda ShuttersXu in 2 months -f/u with VVS, Dr Darrick PennaFields in 6 months -dispo to home w home health  #Paroxysmal atrial fibrillation: Currently NSR. He had an episode of irregular rhythm on 2/19 last night that was thought to be afib, however there are pwaves present on the EKG (?MAT).  His rate improved after IV BB.  Another episode 2/20 afternoon.There was question of prior bleed on coumadin (cardiology note mentions hematoma) vs allergy (coumadin is listed as an allergy).   CHADSVASc is 5 so he should be on anticoagulation.  He does have fall risk.  The patient has expressed desire to be on anticoagulation despite this risk.  Records from Cardiologist (Dr. Deedra EhrichWallmeyer's) office which list PAF but there is no mention of anticoagulation.  His medication list at the visit (in July 2015) includes ASA and metoprolol.  There are no drug allergies listed. There is no mention of bleed or contraindications for anticoagulation in the records from PCP office.  He does not think he has actually been on warfarin and says he is familiar with it including INR checks because his dad used it until an unknown adverse reaction. VVS has cleared him for eliquis to begin today -continue cardiac monitoring -cont metoprolol at low dose (12.5 mg bid) for rate control -begin eliquis 5 mg bid  -IV lopressor prn if he experiences RVR again  #Chest pressure: resolved.   -cardiology consulted re arteriogram clearance -continue cardiac monitoring -albuterol 2.5 mg neb q4hprn  #HTN: BP 90s-130s/40s-90s this morning which includes this procedure. BP medications initially held for permissive HTN.   At home he is on ASA 81 mg daily, lasix 40 mg daily, lopressor 75 mg daily, simvastatin 40 mg qhs.  -ASA 325 mg daily, atorvastatin 40 mg qhs -hold antihypertensives other than metoprolol as BP soft -cont metoprolol at low dose 12.5 mg bid  #Chronic CHF: stable. TTE notable for EF 60-65% and grade 1 diastolic dysfunction. At home he is on ASA 81 mg daily, lasix 40 mg daily,lopressor 75 mg daily, simvastatin 40 mg qhs. -hold antihypertensives to avoid hypotension and BP soft this morning -cont metoprolol 12.5 mg bid as noted above  #HL: Lipid panel this admission w cholesterol 147, LDL 80, HDL 33, and triglyceride 1569. At home he is on simvastatin 40 mg qhs.  D/c simvastatin and started atorvastatin this admission. -continue atorvastatin 40 mg qhs  #Depression: At home he takes sertraline 100 mg daily -cont sertraline 100 mg daily  #Diet: heart  #DVT PPx: eliquis 5 mg bid  #Code: Full  Dispo: Disposition is deferred at this time, awaiting improvement of current medical problems.  Anticipated discharge in approximately 3day(s).  The patient does have a current PCP (Ailene RavelMaura L Hamrick, MD) and [facility].  The patient does not know have transportation limitations that hinder transportation to  clinic appointments.  .Services Needed at time of discharge: Y = Yes, Blank = No PT: Y  OT: Y  RN:   Equipment: Rolling walker with 5" wheels  Other:     LOS: 6 days   Lorenda Hatchet, MD 05/30/2014, 3:01 PM

## 2014-05-30 NOTE — Progress Notes (Signed)
   VASCULAR SURGERY ASSESSMENT & PLAN:  * The patient underwent cerebral arteriogram today. I have reviewed these results. The stenosis on the right is only proximally 50% and slightly irregular. There is no significant vertebral artery disease identified. There is no pseudoaneurysm of the distal left carotid patch. There may be some laminated clot at the distal patch.  * At this point I think it would be safe to start his anticoagulation for atrial fibrillation. I will arrange for him to follow up with Dr. Leonette Mostharles fields in 6 months as he has followed her for carotid disease.   SUBJECTIVE: No specific complaints.  PHYSICAL EXAM: Filed Vitals:   05/30/14 1150 05/30/14 1240 05/30/14 1250 05/30/14 1350  BP: 128/51 108/41 94/41 121/67  Pulse: 87 83 70 69  Temp:      TempSrc:      Resp: 15 22 15 18   Height:      Weight:      SpO2: 92% 91% 90% 97%   Minimal left-sided weakness in the upper extremity and lower extremity.  LABS: Lab Results  Component Value Date   WBC 7.8 05/30/2014   HGB 14.9 05/30/2014   HCT 44.9 05/30/2014   MCV 93.2 05/30/2014   PLT 218 05/30/2014   Lab Results  Component Value Date   CREATININE 1.15 05/30/2014   Lab Results  Component Value Date   INR 1.04 05/24/2014   CBG (last 3)   Recent Labs  05/29/14 2052 05/30/14 0708 05/30/14 1002  GLUCAP 102* 121* 107*    Active Problems:   History of CEA (carotid endarterectomy)   Stroke-like symptoms   CAD (coronary artery disease)   TIA (transient ischemic attack)   HLD (hyperlipidemia)   Essential hypertension   Hemispheric carotid artery syndrome   Internal carotid artery stenosis   Paroxysmal atrial fibrillation   Cari CarawayChris [name] Beeper: [phone] 05/30/2014

## 2014-05-30 NOTE — Progress Notes (Signed)
SLP Cancellation Note    Unable to complete SLP treatment secondary to pt in cath lab. Will continue efforts.    Breck CoonsLisa Willis KanoshLitaker M.Ed [employer]-SLP Pager [phone]

## 2014-05-30 NOTE — Progress Notes (Signed)
Neuro assessment unchanged. Watching TV. Waiting on 2W bed assignment.

## 2014-05-30 NOTE — Op Note (Signed)
Patient name: Jorge [name] MRN: [id number] DOB: 29-Jan-1946 Sex: male  05/24/2014 - 05/30/2014 Pre-operative Diagnosis: Right brain stroke Post-operative diagnosis:  Same Surgeon:  Jorge Ny Procedure Performed:  1.  Ultrasound-guided access, right femoral artery  2.  Aortic arch angiogram  3.  Second order catheterization (right common carotid artery)  4.  Right carotid angiogram  5.  First order catheterization (left common carotid artery)  6.  Left carotid angiogram  7.  First order catheterization (left subclavian artery)  8.  Nonselective left vertebral angiogram and left subclavian angiogram  9.  Second order catheterization (right subclavian artery)  10.  Nonselective right vertebral angiogram and right subclavian angiogram   Indications:  The patient has a history of left carotid endarterectomy.  He presented to the hospital with a right brain stroke and moderate carotid stenosis by CT scan.  He is here for further evaluation.  Procedure:  The patient was identified in the holding area and taken to room 8.  The patient was then placed supine on the table and prepped and draped in the usual sterile fashion.  A time out was called.  Ultrasound was used to evaluate the right common femoral artery.  It was patent .  A digital ultrasound image was acquired.  A micropuncture needle was used to access the right common femoral artery under ultrasound guidance.  An 018 wire was advanced without resistance and a micropuncture sheath was placed.  The 018 wire was removed and a benson wire was placed.  The micropuncture sheath was exchanged for a 5 french sheath.  a pigtail catheter was advanced into the ascending aorta and an aortic arch angiogram was performed next, using a Berenstein 2 catheter and a woolly wire, the right common carotid artery was selected and a right carotid angiogram with intracranial imaging was performed.  Next the [facility] 2 catheter and woolly wire were  used to cannulate the left common carotid artery and a left carotid angiogram was performed with intracranial imaging.  Next, the [facility] 2 catheter was used to select the proximal left subclavian artery and a proximal left subclavian Angie Cheree Ditto was performed with nonselective imaging of the left vertebral artery.  Next using a JB 1 catheter the proximal right subclavian artery was selected and a right proximal subclavian angiogram with nonselective images of the right vertebral artery were performed.    Findings:   Aortogram:    A type I aortic arch is identified with no significant ostial great vessel disease  Right carotid angiogram: The right common carotid artery is widely patent.  Luminal irregularity is noted within the first 2 cm of the proximal right internal carotid artery, however the stenosis is less than 50%.  The right external carotid artery is widely patent.  Left carotid angiogram: The left common carotid artery is widely patent.  Patulous dilatation of the bifurcation and proximal internal carotid artery is identified with no significant stenosis.  At the distal patch there is luminal irregularity.  No definitive pseudoaneurysm is identified.   left subclavian angiogram: The visualized portions of the left subclavian artery are widely patent.  The left vertebral artery is without significant stenosis.    right subclavian angiogram: The proximal right subclavian artery is widely patent.  Right vertebral artery is somewhat smaller than the left but no significant stenosis is identified    Intervention:  none  Impression:  #1  Less than 50% stenosis is identified within the right internal carotid  artery  #2  no significant vertebral artery stenosis is identified  #3  luminal irregularity at the distal left carotid patch.  No definitive pseudoaneurysm is identified    V. Durene CalWells Brabham, M.D. [facility] [place] Office: [phone] Pager:   [phone]

## 2014-05-31 ENCOUNTER — Telehealth: Payer: Self-pay | Admitting: Vascular Surgery

## 2014-05-31 LAB — BASIC METABOLIC PANEL
ANION GAP: 4 — AB (ref 5–15)
BUN: 13 mg/dL (ref 6–23)
CHLORIDE: 104 mmol/L (ref 96–112)
CO2: 29 mmol/L (ref 19–32)
Calcium: 8.4 mg/dL (ref 8.4–10.5)
Creatinine, Ser: 1.02 mg/dL (ref 0.50–1.35)
GFR calc non Af Amer: 73 mL/min — ABNORMAL LOW (ref >90)
GFR, EST AFRICAN AMERICAN: 85 mL/min — AB (ref >90)
Glucose, Bld: 105 mg/dL — ABNORMAL HIGH (ref 70–99)
POTASSIUM: 3.9 mmol/L (ref 3.5–5.1)
SODIUM: 137 mmol/L (ref 135–145)

## 2014-05-31 LAB — GLUCOSE, CAPILLARY
GLUCOSE-CAPILLARY: 104 mg/dL — AB (ref 70–99)
GLUCOSE-CAPILLARY: 102 mg/dL — AB (ref 70–99)
Glucose-Capillary: 126 mg/dL — ABNORMAL HIGH (ref 70–99)

## 2014-05-31 MED ORDER — ATORVASTATIN CALCIUM 40 MG PO TABS: 40.0000 mg | ORAL_TABLET | Freq: Every day | ORAL | Status: DC

## 2014-05-31 MED ORDER — METOPROLOL TARTRATE 25 MG PO TABS: 12.5000 mg | ORAL_TABLET | Freq: Two times a day (BID) | ORAL | Status: DC

## 2014-05-31 MED ORDER — APIXABAN 5 MG PO TABS: 5.0000 mg | ORAL_TABLET | Freq: Two times a day (BID) | ORAL | Status: DC

## 2014-05-31 MED ORDER — ASPIRIN 325 MG PO TABS: 325.0000 mg | ORAL_TABLET | Freq: Every day | ORAL | Status: DC

## 2014-05-31 NOTE — Progress Notes (Signed)
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 On 2/23 pt underwent cerbral arteriogram.    PT Comments    Pt with improved ambulation tolerance and LUE/LE strength compared to Monday. Pt remains at increased falls risk and requires RW for safe ambulation. Pt remains unsafe to return home alone to indep living apartment and would need 24/7 assist. Pt reports his daughter-in-law will probably come over and stay with him for a little while. Acute PT to con't to follow to progress indep with mobility.   Follow Up Recommendations  Home health PT;Supervision/Assistance - 24 hour     Equipment Recommendations  Rolling walker with 5" wheels    Recommendations for Other Services       Precautions / Restrictions Precautions Precautions: Fall Precaution Comments: pt with h/o falls including this admission.  Restrictions Weight Bearing Restrictions: No    Mobility  Bed Mobility               General bed mobility comments: pt sitting EOB upon PT arrival  Transfers Overall transfer level: Needs assistance Equipment used: Rolling walker (2 wheeled) Transfers: Sit to/from Stand Sit to Stand: Min guard         General transfer comment: v/c's to push up from walker, increased time  Ambulation/Gait Ambulation/Gait assistance: Min guard Ambulation Distance (Feet): 150 Feet Assistive device: Rolling walker (2 wheeled) Gait Pattern/deviations: Step-through pattern;Decreased stride length;Shuffle Gait velocity: decreased Gait velocity interpretation: Below normal speed for age/gender General Gait Details: pt with improved endurance as he did not require any  standing rest break. Pt denies increase in L LE pain. pt does requires RW for safe ambulation as pt with significant instability when RW taken away   Stairs            Wheelchair Mobility    Modified Rankin (Stroke Patients Only) Modified Rankin (Stroke Patients Only) Pre-Morbid Rankin Score: Slight disability Modified Rankin: Moderate disability     Balance Overall balance assessment: Needs assistance         Standing balance support: Bilateral upper extremity supported Standing balance-Leahy Scale: Fair                      Cognition Arousal/Alertness: Awake/alert Behavior During Therapy: WFL for tasks assessed/performed Overall Cognitive Status: Within Functional Limits for tasks assessed                      Exercises General Exercises - Lower Extremity Long Arc Quad: AROM;Both;10 reps;Seated Hip ABduction/ADduction: AROM;Both;10 reps;Seated Hip Flexion/Marching: AROM;Both;10 reps;Seated    General Comments        Pertinent Vitals/Pain Pain Assessment: 0-10 Pain Score: 4  Pain Location: chronic back pain Pain Intervention(s): Monitored during session    Home Living                      Prior Function            PT Goals (current goals can now be found in the care plan section) Acute Rehab PT Goals Patient Stated Goal: return home Progress towards PT goals: Progressing toward goals    Frequency  Min 3X/week  PT Plan Current plan remains appropriate    Co-evaluation             End of Session Equipment Utilized During Treatment: Gait belt Activity Tolerance: Patient tolerated treatment well Patient left: in chair;with call bell/phone within reach     Time: 0731-0751 PT Time Calculation (min) (ACUTE ONLY): 20 min  Charges:  $Gait Training: 8-22 mins                    G Codes:      Jorge [name], Jorge [name] 05/31/2014, 8:40 AM   Jorge [name], PT, DPT Pager #: [phone] Office #: [phone]

## 2014-05-31 NOTE — Telephone Encounter (Addendum)
-----   Message from Chuck Hinthristopher S Dickson, MD sent at 05/30/2014  2:02 PM EST ----- Regarding: f/u This patient can be in [  ]'s on the list. He will need a follow up visit with [name] in 6 months with a carotid duplex scan at that time. Thank you. CD  notified patient of fu ov and carotid duplex with [name] on 11-30-14 at 3pm for a carotid duplex and then to see dr. fields at [facility]

## 2014-05-31 NOTE — Discharge Instructions (Addendum)
[facility] We have started a new medication called eliquis to be taken twice a day for your atrial fibrillation. We have also stopped your lasix and pravastatin. We started a new statin (atorvastatin) once daily. We have also lowered lopressor to 12.5 mg twice a day.  Please return to the Emergency Department or seek medical attention if you have any new or worsening weakness, slurred speech, trouble breathing, chest pain or other worrisome medical condition.   Farley LyAdam Jeovanny Cuadros, MD     Information on my medicine - ELIQUIS (apixaban)  This medication education was reviewed with me or my healthcare representative as part of my discharge preparation.  The pharmacist that spoke with me during my hospital stay was:  Fredrik RiggerMarkle, Jennifer Sue, [facility]  Why was Eliquis prescribed for you? Eliquis was prescribed for you to reduce the risk of a blood clot forming that can cause a stroke if you have a medical condition called atrial fibrillation (a type of irregular heartbeat).  What do You need to know about Eliquis ? Take your Eliquis TWICE DAILY - one tablet in the morning and one tablet in the evening with or without food. If you have difficulty swallowing the tablet whole please discuss with your pharmacist how to take the medication safely.  Take Eliquis exactly as prescribed by your doctor and DO NOT stop taking Eliquis without talking to the doctor who prescribed the medication.  Stopping may increase your risk of developing a stroke.  Refill your prescription before you run out.  After discharge, you should have regular check-up appointments with your healthcare provider that is prescribing your Eliquis.  In the future your dose may need to be changed if your kidney function or weight changes by a significant amount or as you get older.  What do you do if you miss a dose? If you miss a dose, take it as soon as you remember on the same day and resume taking twice  daily.  Do not take more than one dose of ELIQUIS at the same time to make up a missed dose.  Important Safety Information A possible side effect of Eliquis is bleeding. You should call your healthcare provider right away if you experience any of the following: ? Bleeding from an injury or your nose that does not stop. ? Unusual colored urine (red or dark brown) or unusual colored stools (red or black). ? Unusual bruising for unknown reasons. ? A serious fall or if you hit your head (even if there is no bleeding).  Some medicines may interact with Eliquis and might increase your risk of bleeding or clotting while on Eliquis. To help avoid this, consult your healthcare provider or pharmacist prior to using any new prescription or non-prescription medications, including herbals, vitamins, non-steroidal anti-inflammatory drugs (NSAIDs) and supplements.  This website has more information on Eliquis (apixaban): http://www.eliquis.com/eliquis/home

## 2014-05-31 NOTE — Discharge Summary (Signed)
Name: Jorge [name] MRN: [id number] DOB: 1946/01/15 69 y.o. PCP: Ailene Ravel, MD  Date of Admission: 05/24/2014 11:57 AM Date of Discharge: 05/31/2014 Attending Physician: Farley Ly, MD  Discharge Diagnosis: 1. TIA 2. Paroxysmal atrial fibrillation 3. HTN 4. Chest Pressure 5. Hyperlipidemia 6. Depression Discharge Medications:   Medication List    STOP taking these medications        furosemide 40 MG tablet  Commonly known as:  LASIX     lisinopril 40 MG tablet  Commonly known as:  PRINIVIL,ZESTRIL     simvastatin 40 MG tablet  Commonly known as:  ZOCOR      TAKE these medications        acetaminophen 500 MG tablet  Commonly known as:  TYLENOL  Take 1,000 mg by mouth every 6 (six) hours as needed for headache (pain).     ALPRAZolam 0.5 MG tablet  Commonly known as:  XANAX  Take 0.5 mg by mouth at bedtime as needed for sleep.     apixaban 5 MG Tabs tablet  Commonly known as:  ELIQUIS  Take 1 tablet (5 mg total) by mouth 2 (two) times daily.     aspirin 81 MG tablet  Take 81 mg by mouth daily.     atorvastatin 40 MG tablet  Commonly known as:  LIPITOR  Take 1 tablet (40 mg total) by mouth daily at 6 PM.     buPROPion 150 MG 12 hr tablet  Commonly known as:  WELLBUTRIN SR  Take 150 mg by mouth daily.     gabapentin 800 MG tablet  Commonly known as:  NEURONTIN  Take 800 mg by mouth 2 (two) times daily.     metoprolol tartrate 25 MG tablet  Commonly known as:  LOPRESSOR  Take 0.5 tablets (12.5 mg total) by mouth 2 (two) times daily.     sertraline 100 MG tablet  Commonly known as:  ZOLOFT  Take 100 mg by mouth daily.        Disposition and follow-up:   Jorge [name] was discharged from [facility] in good condition.  At the hospital follow up visit please address:  1.  R carotid stenosis on [facility] follow-up, tolerance of eliquis, Eliquis pre-authorization after 30 day free trial ([phone]) BP medication  regimen  2.  Labs / imaging needed at time of follow-up: none  3.  Pending labs/ test needing follow-up: none  Follow-up Appointments: Follow-up Information    Follow up with [facility]   Why:  they will do your home health care services at your home   Contact information:   [address] [place] [address] [phone]       Follow up with Ailene Ravel, MD On 06/07/2014.   Specialty:  Family Medicine   Why:  @ 10:45 am (bring medications)   Contact information:   Dr. Burnell Blanks [address] [place] [address] [phone]       Follow up with Sherren Kerns, MD On 11/30/2014.   Specialty:  Vascular Surgery   Why:  @ 3 pm   Contact information:   [address] [place] [address] [phone]       Follow up with Xu,Jindong, MD. Schedule an appointment as soon as possible for a visit in 2 months.   Specialty:  Neurology   Contact information:   [address] [place] [address] [phone]  Discharge Instructions: Discharge Instructions    Ambulatory referral to Neurology    Complete by:  As directed   Dr. Roda Shutters requests follow up for this patient in 1-2 months.     Diet - low sodium heart healthy    Complete by:  As directed      Increase activity slowly    Complete by:  As directed            Consultations: Treatment Team:  Chuck Hint, MD Sherren Kerns, MD  Procedures Performed:  Dg Chest 2 View  05/24/2014   CLINICAL DATA:  Stroke-like symptoms.  The patient fell today.  EXAM: CHEST  2 VIEW  COMPARISON:  05/06/2008 and 04/28/2008  FINDINGS: Heart size and pulmonary vascularity are normal and the lungs are clear. No osseous abnormality.  IMPRESSION: No active cardiopulmonary disease.   Electronically Signed   By: Francene Boyers M.D.   On: 05/24/2014 20:45   Ct Head (brain) Wo Contrast  05/24/2014   CLINICAL DATA:  Code stroke with left arm weakness.  EXAM: CT HEAD  WITHOUT CONTRAST  TECHNIQUE: Contiguous axial images were obtained from the base of the skull through the vertex without intravenous contrast.  COMPARISON:  04/28/2008  FINDINGS: Skull and Sinuses:Negative for fracture or destructive process. The mastoids, middle ears, and imaged paranasal sinuses are clear.  Orbits: No acute abnormality.  Brain: No evidence of acute infarction, hemorrhage, hydrocephalus, or mass lesion/mass effect.  There is mild patchy bilateral cerebral white matter low density, likely chronic small vessel disease. There has been no significant progression from 2010. No asymmetric density of the central vessels.  These results were called by telephone at the time of interpretation on 05/24/2014 at 12:17 pm to Dr. Benjiman Core , who verbally acknowledged these results.  IMPRESSION: 1. No evidence of acute intracranial disease. 2. Mild chronic small vessel ischemia.   Electronically Signed   By: Marnee Spring M.D.   On: 05/24/2014 12:18   Ct Angio Neck W/cm &/or Wo/cm  05/26/2014   CLINICAL DATA:  Right carotid artery stenosis.  EXAM: CT ANGIOGRAPHY NECK  TECHNIQUE: Multidetector CT imaging of the neck was performed using the standard protocol during bolus administration of intravenous contrast. Multiplanar CT image reconstructions and MIPs were obtained to evaluate the vascular anatomy. Carotid stenosis measurements (when applicable) are obtained utilizing NASCET criteria, using the distal internal carotid diameter as the denominator.  CONTRAST:  80mL OMNIPAQUE IOHEXOL 350 MG/ML SOLN  COMPARISON:  MRA head 05/24/2014  FINDINGS: Aortic arch: A 3 vessel arch configuration is present. Minimal atherosclerotic calcifications are present at the aortic arch. There is no significant stenosis associated with the great vessel origins.  Right carotid system: The right common carotid artery is within normal limits. There is significant noncalcified soft tissue plaque at the proximal right internal  carotid artery. The area of narrowing spans 2.5 cm. The most focal areas stenosis is 1.5 cm from the bifurcation with the lumen is narrowed to 1.7 mm. This compares with a more distal lumen of 5.0 mm.  Left carotid system: The left common carotid artery is tortuous. Surgical clips are present. Changes of the proximal left internal carotid artery suggest carotid endarterectomy. There is irregularity within the distal anastomosis with a blind-ending pouch extending 7 mm superiorly along the medial aspect of the distal anastomosis suggesting pseudoaneurysm formation. More distal left ICA is normal.  Vertebral arteries:The vertebral artery origins are within normal limits bilaterally. Mild tortuosity is evident. The left  vertebral artery is dominant. There are no significant stenoses within the neck. The vertebrobasilar junction is visualized intracranial A. The PICA origins are visualized and normal. Mild atherosclerotic calcifications are present at the dural margins without significant stenosis.  Skeleton: Endplate degenerative changes are most evident at C6-7 with bilateral osseous foraminal stenosis. More mild endplate changes are noted at C3-4 and C5-6. The foramina are otherwise patent. No focal lytic or blastic lesions are evident.  Other neck: Limited imaging of the brain is unremarkable. The soft tissues of the neck are within normal limits. The thyroid is normal. No significant adenopathy is present. And lung apices demonstrate mild dependent atelectasis. No focal nodule, mass, or airspace disease is present. The superior mediastinum is unremarkable.  IMPRESSION: 1. Sixty- 65% stenosis of the right internal carotid artery with a long segment of atherosclerotic narrowing and noncalcified soft tissue plaque. 2. Left carotid endarterectomy. 3. Irregularity in a blind ending pouch of the distal anastomosis suggests pseudoaneurysm formation within the distal left carotid endarterectomy. There is no proximal  stenosis. 4. Mild cervical spondylosis as described.   Electronically Signed   By: Marin Roberts M.D.   On: 05/26/2014 10:07   Jorge Brain Wo Contrast  05/24/2014   CLINICAL DATA:  69 year old male with left side weakness and loss of consciousness. Initial encounter.  EXAM: MRI HEAD WITHOUT CONTRAST  MRA HEAD WITHOUT CONTRAST  TECHNIQUE: Multiplanar, multiecho pulse sequences of the brain and surrounding structures were obtained without intravenous contrast. Angiographic images of the head were obtained using MRA technique without contrast.  COMPARISON:  Head CT without contrast 1212 hr today and earlier.  FINDINGS: MRI HEAD FINDINGS  Cerebral volume is within normal limits for age. No restricted diffusion to suggest acute infarction. No midline shift, mass effect, evidence of mass lesion, ventriculomegaly, extra-axial collection or acute intracranial hemorrhage. Cervicomedullary junction and pituitary are within normal limits. Negative visualized cervical spine. Major intracranial vascular flow voids are preserved.  Scattered mild to moderate for age nonspecific cerebral white matter T2 and FLAIR hyperintensity. No cortical encephalomalacia identified. Deep gray matter nuclei are within normal limits. Mild to moderate patchy T2 hyperintensity in the pons. Cerebellum within normal limits.  Visible internal auditory structures appear normal. Visualized paranasal sinuses and mastoids are clear. Postoperative changes to the left globe. Negative right orbit soft tissues. Visualized scalp soft tissues are within normal limits. Normal bone marrow signal.  MRA HEAD FINDINGS  Antegrade flow in the posterior circulation. Dominant appearing distal left vertebral artery. Both PICA origins are patent. Patent basilar artery without stenosis. Fetal type right PCA origin. SCA and left PCA origins are within normal limits. PCA branches are within normal limits. Left posterior communicating artery is diminutive or absent.   Antegrade flow in both ICA siphons. No siphon stenosis. Ophthalmic and right posterior communicating artery origins are normal. Normal carotid termini, MCA and ACA origins. Anterior communicating artery and visualized ACA branches are within normal limits. Visualized bilateral MCA branches are within normal limits.  IMPRESSION: 1. No acute intracranial abnormality. Mild to moderate for age nonspecific signal changes in the brain, most commonly due to chronic small vessel disease. 2.  Negative intracranial MRA.   Electronically Signed   By: Odessa Fleming M.D.   On: 05/24/2014 18:19   Dg Chest Port 1 View  05/27/2014   CLINICAL DATA:  Chest pain.  Near syncope.  EXAM: PORTABLE CHEST - 1 VIEW  COMPARISON:  05/24/2014  FINDINGS: There is a shallow inspiration. There is mild  unchanged right hemidiaphragm elevation. Heart size is unchanged. There is mild basilar crowding associated with the shallow inspiration. There is no consolidated airspace opacity. There is no large effusion.  IMPRESSION: No acute findings.   Electronically Signed   By: Ellery Plunk M.D.   On: 05/27/2014 03:59   Jorge Maxine Glenn Head/brain Wo Cm  05/24/2014   CLINICAL DATA:  69 year old male with left side weakness and loss of consciousness. Initial encounter.  EXAM: MRI HEAD WITHOUT CONTRAST  MRA HEAD WITHOUT CONTRAST  TECHNIQUE: Multiplanar, multiecho pulse sequences of the brain and surrounding structures were obtained without intravenous contrast. Angiographic images of the head were obtained using MRA technique without contrast.  COMPARISON:  Head CT without contrast 1212 hr today and earlier.  FINDINGS: MRI HEAD FINDINGS  Cerebral volume is within normal limits for age. No restricted diffusion to suggest acute infarction. No midline shift, mass effect, evidence of mass lesion, ventriculomegaly, extra-axial collection or acute intracranial hemorrhage. Cervicomedullary junction and pituitary are within normal limits. Negative visualized cervical  spine. Major intracranial vascular flow voids are preserved.  Scattered mild to moderate for age nonspecific cerebral white matter T2 and FLAIR hyperintensity. No cortical encephalomalacia identified. Deep gray matter nuclei are within normal limits. Mild to moderate patchy T2 hyperintensity in the pons. Cerebellum within normal limits.  Visible internal auditory structures appear normal. Visualized paranasal sinuses and mastoids are clear. Postoperative changes to the left globe. Negative right orbit soft tissues. Visualized scalp soft tissues are within normal limits. Normal bone marrow signal.  MRA HEAD FINDINGS  Antegrade flow in the posterior circulation. Dominant appearing distal left vertebral artery. Both PICA origins are patent. Patent basilar artery without stenosis. Fetal type right PCA origin. SCA and left PCA origins are within normal limits. PCA branches are within normal limits. Left posterior communicating artery is diminutive or absent.  Antegrade flow in both ICA siphons. No siphon stenosis. Ophthalmic and right posterior communicating artery origins are normal. Normal carotid termini, MCA and ACA origins. Anterior communicating artery and visualized ACA branches are within normal limits. Visualized bilateral MCA branches are within normal limits.  IMPRESSION: 1. No acute intracranial abnormality. Mild to moderate for age nonspecific signal changes in the brain, most commonly due to chronic small vessel disease. 2.  Negative intracranial MRA.   Electronically Signed   By: Odessa Fleming M.D.   On: 05/24/2014 18:19    2D Echo:  - Left ventricle: The cavity size was normal. There was moderate concentric hypertrophy. Systolic function was normal. The estimated ejection fraction was in the range of 60% to 65%. Wall motion was normal; there were no regional wall motion abnormalities. There was an increased relative contribution of atrial contraction to ventricular filling. Doppler parameters  are consistent with abnormal left ventricular relaxation (grade 1 diastolic dysfunction).  Carotid duplex: Right: intimal wall thickening CCA. Moderate to severe mixed plaque origin ICA. 60-79% mid range of scale. Vertebral artery exhibits the "bunny rabbit" sign. Left: intimal wall thickening CCA. CEA is patent. Mild mixed plaque origin ICA. 1-39% ICA stenosis. Vertebral artery flow is antegrade.  Cerebral arteriogram: #1 Less than 50% stenosis is identified within the right internal carotid artery #2 no significant vertebral artery stenosis is identified #3 luminal irregularity at the distal left carotid patch. 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 He said he woke up this morning at about 8 am feeling at baseline except for mild headache. He went to walk his dog at about 10:45 am when he felt very dizzy and "blacked out" on the grass of his yard. He was unsure if he hit his head. No post-ictal state, loss of bowel or bladder consciousness. He said he has since felt increased heaviness of his L arm and leg with paresthesia. He says he has baseline L sided weakness since his cerebrovascular event about two years ago which was complicated by seizures and required a left carotid endarterectomy. He also notes that about three days ago he had trouble lifting his left leg out of his car but that resolved shortly after. He otherwise denies any chest pain, SOB, nausea, emesis, diarrhea, constipation, fevers, chills, night sweats, vision changes.  In the ED, he was seen by neurology with NIHSS 3 and no tPA given due to minimal symptoms.  Hospital Course by problem list: Active Problems:   History of CEA (carotid endarterectomy)   Stroke-like symptoms   CAD (coronary artery disease)   TIA (transient ischemic  attack)   HLD (hyperlipidemia)   Essential hypertension   Hemispheric carotid artery syndrome   Internal carotid artery stenosis   Paroxysmal atrial fibrillation   #TIA: Jorge [name] had previous R sided weakness from his previous CVA which required a L carotid endarterectomy two years ago. He received a TIA/stroke work-up for his current episode which was notable for head MRI/MRA with no acute intracranial abnormality but mild to moderate for age nonspecific signal changes in the brain most commonly due to chronic small vessel disease, echo w EF 60-65% and grade 1 diastolic dysfunction, and carotid doppler with 60-79% stenosis of the R ICA. After this doppler, he had neck CTA which noted 60-65% stenosis of R ICA as well as irregularity suggesting pseudoaneurysm in the distal L carotid endarterectomy. Vascular asked for cardiology clearance which was obtained prior to a cerebral arteriogram 05/30/14 which noted promxially 50% stenosis and slight irregularlity of R ICA but no significant vertebral artery disease. There was also no pseudoaneurysm of the distal left carotid patch as suggested on neck CTA. No further intervention by VVS who recommends follow-up with Dr Fabienne Bruns in 6 months which they will arrange. His exam is unchanged with decreased cranial nerve V sensation on L face but strength symmetric. He began eliquis 05/30/14 as VVS has cleared him and faxed records from Dr Bonnielee Haff in [place] as well as from PCP Dr Nathanial Rancher and neither mention warfarin or anti-coagulation and he would like anti-coagulation. He was also  on ASA 325 mg daily but put back on 81 mg daily at discharge and statin was changed to atorvastatin 40 mg daily. He is going home with home health PT. He is to follow-up in two months with Dr Roda Shutters of neurology and Dr Darrick Penna of vascular in 2 months.   #Paroxysmal atrial fibrillation: Currently NSR. He had an episode of irregular rhythm on 2/19 that was thought to be afib,  however there were pwaves present on the EKG (?MAT). His rate improved after IV BB. Another episode 2/20 afternoon.There was question of prior bleed on coumadin (cardiology note mentions hematoma) vs allergy (coumadin is listed as an allergy). CHADSVASc is 5 so he should be on anticoagulation. He does have fall risk. The patient has expressed desire to be on anticoagulation despite this risk. Records from Cardiologist (Dr. Deedra Ehrich) office which list PAF but  there is no mention of anticoagulation. His medication list at the visit (in July 2015) includes ASA and metoprolol. There are no drug allergies listed. There is no mention of bleed or contraindications for anticoagulation in the records from PCP office. He does not think he has actually been on warfarin and says he is familiar with it including INR checks because his dad used it until an unknown adverse reaction. [facility] has cleared him for eliquis which was started 05/30/14. He was discharged on low dose metoprolol (12.5 mg bid) with low/normal pressures (antihypertensives held in setting of suspected CVA).  #Chest pressure: Jorge [name] had episode of chest pressure with desaturation to 89% on room air early 2/18 morning. He thought at the time it could be acid reflux. EKG was unchanged and troponin negative x 3. CXR no active cardiopulmonary disease. He says he has had this before and attributed to aortic stenosis. Echo with non-contributory findings. He has no chest pain overnight and is sating mid to high 90s on room air. He was alter seen by cardiology for cardiac clearance before arteriogram and they were not concerned.  #HTN:At home he is on ASA 81 mg daily, lasix 40 mg daily, lopressor 75 mg daily, simvastatin 40 mg qhs.Antihypertensives were initially all held for stroke/TIA permissive hypertension. Metoprolol was restarted at low dose (12.5 mg bid) and he has been normotensive only on this medicine. Also given carotid stenosis did not  want to drop BP. PCP may consider whether to restart others. His statin was changed to higher intensity atorvastatin 40 mg daily and ASA 81 mg daily continued.  #Chronic CHF: stable. TTE notable for EF 60-65% and grade 1 diastolic dysfunction. At home he is on ASA 81 mg daily, lasix 40 mg daily,lopressor 75 mg daily, simvastatin 40 mg qhs. See above re antihypertensives.  #HL: Lipid panel this admission w cholesterol 147, LDL 80, HDL 33, and triglyceride 1569. At home he is on simvastatin 40 mg qhs. We stopped simvastatin and started atorvastatin this admission.  #Depression: At home he takes sertraline 100 mg daily which was continued.  Discharge Vitals:   BP 108/73 mmHg  Pulse 60  Temp(Src) 97.4 F (36.3 C) (Oral)  Resp 18  Ht 6' (1.829 m)  Wt 347 lb 3.6 oz (157.5 kg)  BMI 47.08 kg/m2  SpO2 95%  Discharge Physical Exam: Gen: A&O x 3, no acute distress, laying in bed HEENT: [place]/AT, post-surgical pupil OS, OS brown iris OD blue iris, EOMI Heart: Regular rate and rhythm, normal S1 S2, 1/6 systolic ejection murmur Lungs: Clear to auscultation bilaterally, respirations unlabored Abd: obese, Soft, non-tender, non-distended, + bowel sounds, no hepatosplenomegaly Ext: 0-1+ B/L pedal edema Neuro: A&O x 3, CN 3-12 grossly intact (subjectively decreased sensation on the upper left side of face which is improved from entire L side of face), strength feels 5/5 and symmetricin all extremities, b/l tremor on finger-nose-finger unchanged  Discharge Labs:  Basic Metabolic Panel:  Recent Labs Lab 05/30/14 0459 05/31/14 0536  NA 138 137  K 4.1 3.9  CL 102 104  CO2 30 29  GLUCOSE 116* 105*  BUN 14 13  CREATININE 1.15 1.02  CALCIUM 8.8 8.4   Liver Function Tests:  Recent Labs Lab 05/25/14 0844  AST 22  ALT 22  ALKPHOS 88  BILITOT 0.9  PROT 6.8  ALBUMIN 3.5   CBC:  Recent Labs Lab 05/26/14 0736 05/30/14 0459  WBC 7.2 7.8  HGB 14.8 14.9  HCT 44.4 44.9  MCV  92.3 93.2  PLT  219 218   Cardiac Enzymes:  Recent Labs Lab 05/25/14 0525 05/25/14 1105 05/25/14 1653  TROPONINI <0.03 0.04* 0.04*   CBG:  Recent Labs Lab 05/29/14 1609 05/29/14 2052 05/30/14 0708 05/30/14 1002 05/30/14 1639 05/31/14 0627  GLUCAP 92 102* 121* 107* 94 104*   Hemoglobin A1C:  Recent Labs Lab 05/25/14 0844  HGBA1C 5.7*   Fasting Lipid Panel:  Recent Labs Lab 05/25/14 0844  CHOL 147  HDL 33*  LDLCALC 80  TRIG [id number]*  CHOLHDL 4.5   Coagulation: No results for input(s): LABPROT, INR in the last 168 hours.  Signed: Lorenda HatchetAdam L Najib Colmenares, MD 05/31/2014, 1:38 PM    Services Ordered on Discharge: home health PT/OT Equipment Ordered on Discharge: rolling walker with 5" wheels

## 2014-05-31 NOTE — Progress Notes (Signed)
Subjective: NAEON. Jorge [name] was started on eliquis yesterday following his arteriogram. He was speaking with physical therapy when I came in. No new complaints. He says he is ready for discharge home today and feels safe at home.  Objective: Vital signs in last 24 hours: Filed Vitals:   05/30/14 1607 05/30/14 1802 05/31/14 0029 05/31/14 0611  BP: 117/63 109/77 123/69 108/73  Pulse: 65 65 64 60  Temp:   97.5 F (36.4 C) 97.4 F (36.3 C)  TempSrc:   Oral Oral  Resp: Height:      Weight:      SpO2: 95% 97% 93% 95%   Weight change:   Intake/Output Summary (Last 24 hours) at 05/31/14 1100 Last data filed at 05/30/14 1829  Gross per 24 hour  Intake    488 ml  Output      0 ml  Net    488 ml   Gen: A&O x 3, no acute distress, laying in bed HEENT: [place]/AT, post-surgical pupil OS, OS brown iris OD blue iris, EOMI Heart: Regular rate and rhythm, normal S1 S2, 1/6 systolic ejection murmur Lungs: Clear to auscultation bilaterally, respirations unlabored Abd: obese, Soft, non-tender, non-distended, + bowel sounds, no hepatosplenomegaly Ext: 0-1+ B/L pedal edema Neuro: A&O x 3, CN 3-12 grossly intact (subjectively decreased sensation on the upper left side of face which is improved from entire L side of face), strength feels 5/5 and symmetricin all extremities, b/l tremor on finger-nose-finger unchanged  Lab Results: Basic Metabolic Panel:  Recent Labs Lab 05/30/14 0459 05/31/14 0536  NA 138 137  K 4.1 3.9  CL 102 104  CO2 30 29  GLUCOSE 116* 105*  BUN 14 13  CREATININE 1.15 1.02  CALCIUM 8.8 8.4   CBG:  Recent Labs Lab 05/29/14 1609 05/29/14 2052 05/30/14 0708 05/30/14 1002 05/30/14 1639 05/31/14 0627  GLUCAP 92 102* 121* 107* 94 104*   TTE: - Left ventricle: The cavity size was normal. There was moderate concentric hypertrophy. Systolic function was normal. The estimated ejection fraction was in the range of 60% to 65%. Wall motion was  normal; there were no regional wall motion abnormalities. There was an increased relative contribution of atrial contraction to ventricular filling. Doppler parameters are consistent with abnormal left ventricular relaxation (grade 1 diastolic dysfunction).  Carotid duplex (prelim): There is 60-79% right ICA stenosis, lowest end of scale. The left CEA is patent. 1-39% left ICA stenosis. The right vertebral artery exhibits a "bunny sign" waveform. Left vertebral artery flow is antegrade.   Studies/Results: No results found. Medications: I have reviewed the patient's current medications. Scheduled Meds: . apixaban  5 mg Oral BID  . aspirin  325 mg Oral Daily  . atorvastatin  40 mg Oral q1800  . metoprolol tartrate  12.5 mg Oral BID  . sertraline  100 mg Oral Daily   Continuous Infusions: . sodium chloride Stopped (05/30/14 1531)   PRN Meds:.acetaminophen **OR** acetaminophen, albuterol, alum & mag hydroxide-simeth, guaiFENesin-dextromethorphan, hydrALAZINE, labetalol, metoprolol, ondansetron, oxyCODONE-acetaminophen, phenol, senna-docusate   Assessment/Plan: Active Problems:   History of CEA (carotid endarterectomy)   Stroke-like symptoms   CAD (coronary artery disease)   TIA (transient ischemic attack)   HLD (hyperlipidemia)   Essential hypertension   Hemispheric carotid artery syndrome   Internal carotid artery stenosis   Paroxysmal atrial fibrillation  #TIA: Jorge [name] had arteriogram yesterday which noted promxially 50% stenosis and slight irregularlity of R ICA but no significant vertebral artery  disease. There was also no pseudoaneurysm of the distal left carotid patch as suggested on neck CTA. No further intervention by [facility] who recommends follow-up with Dr Fabienne Brunsharles Fields in 6 months which they will arrange. His exam is unchanged with decreased cranial nerve V sensation on L face but strength symmetric. He began eliquis yesterday as [facility] has cleared him and faxed  [place] Bonnie[place] well as from PCP Dr Nathanial RancherHamrick and neither mention warfarin or anti-coagulation and he would like anti-coagulation. -appreciate neuro, [facility], cards -continue ASA 325 mg po daily, Lipitor 40 mg daily -eliquis 5 mg bid for a fib -continue PT/OT; HH PT/OT at discharge -continue cardiac monitoring -follow-up with Neurologist, Dr. Roda ShuttersXu in 2 months -f/u with [facility], Dr Darrick PennaFields in 6 months -dispo to home w home health  #Paroxysmal atrial fibrillation: Currently NSR. He had an episode of irregular rhythm on 2/19 that was thought to be afib, however there were pwaves present on the EKG (?MAT).  His rate improved after IV BB.  Another episode 2/20 afternoon.There was question of prior bleed on coumadin (cardiology note mentions hematoma) vs allergy (coumadin is listed as an allergy).   CHADSVASc is 5 so he should be on anticoagulation.  He does have fall risk.  The patient has expressed desire to be on anticoagulation despite this risk.  Records from Cardiologist (Dr. Deedra EhrichWallmeyer's) office which list PAF but there is no mention of anticoagulation.  His medication list at the visit (in July 2015) includes ASA and metoprolol.  There are no drug allergies listed. There is no mention of bleed or contraindications for anticoagulation in the records from PCP office.  He does not think he has actually been on warfarin and says he is familiar with it including INR checks because his dad used it until an unknown adverse reaction. [facility] has cleared him for eliquis which was started yesterday -continue cardiac monitoring -cont metoprolol at low dose (12.5 mg bid) for rate control -eliquis 5 mg bid  -IV lopressor prn if he experiences RVR again  #Chest pressure: resolved.   -cardiology consulted re arteriogram clearance -continue cardiac monitoring -albuterol 2.5 mg neb q4hprn  #HTN: BP 100s-130s/50s-70s overnight/this morning. BP medications initially held for permissive HTN.  At  home he is on ASA 81 mg daily, lasix 40 mg daily, lopressor 75 mg daily, simvastatin 40 mg qhs.  -ASA 325 mg daily, atorvastatin 40 mg qhs -hold antihypertensives other than metoprolol as BP soft (PCP may reassess) -cont metoprolol at low dose 12.5 mg bid  #Chronic CHF: stable. TTE notable for EF 60-65% and grade 1 diastolic dysfunction. At home he is on ASA 81 mg daily, lasix 40 mg daily,lopressor 75 mg daily, simvastatin 40 mg qhs. -hold antihypertensives to avoid hypotension and BP continues to be soft -cont metoprolol 12.5 mg bid as noted above  #HL: Lipid panel this admission w cholesterol 147, LDL 80, HDL 33, and triglyceride 1569. At home he is on simvastatin 40 mg qhs.  D/c simvastatin and started atorvastatin this admission. -continue atorvastatin 40 mg qhs  #Depression: At home he takes sertraline 100 mg daily -cont sertraline 100 mg daily  #Diet: heart  #DVT PPx: eliquis 5 mg bid  #Code: Full  Dispo: Disposition is deferred at this time, awaiting improvement of current medical problems.  Anticipated discharge in approximately today.  The patient does have a current PCP (Ailene RavelMaura L Hamrick, MD) and does need [facility] appointment after discharge.  The  patient does not know have transportation limitations that hinder transportation to clinic appointments.  .Services Needed at time of discharge: Y = Yes, Blank = No PT: Y  OT: Y  RN:   Equipment: Rolling walker with 5" wheels  Other:     LOS: 7 days   Lorenda Hatchet, MD 05/31/2014, 11:00 AM

## 2014-05-31 NOTE — Progress Notes (Signed)
Speech Language Pathology Treatment: Cognitive-Linquistic  Patient Details Name: Jorge [name] MRN: [id number] DOB: 12/08/1945 Today's Date: 05/31/2014 Time: [id number] SLP Time Calculation (min) (ACUTE ONLY): 11 min  Assessment / Plan / Recommendation Clinical Impression  F/u for cognition: pt with improved short-term recall; reviewed compensatory strategies to facilitate recall.  Able to verbalize instructions/safety precautions taught to him by OT immediately preceding our session with only occasional cues.  Improved safety/insight compared to initial assessment.  No f/u SLP warranted - will sign off.    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 He said he woke up this morning at about 8 am feeling at baseline except for mild headache. He went to walk his dog at about 10:45 am when he felt very dizzy and "blacked out" on the grass of his yard. He was unsure if he hit his head. No post-ictal state, loss of bowel or bladder consciousness. He said he has since felt increased heaviness of his L arm and leg with paresthesia. He says he has baseline L sided weakness since his cerebrovascular event about two years ago which was complicated by seizures and required a left carotid endarterectomy. He also notes that about three days ago he had trouble lifting his left leg out of his car but that resolved shortly after. He otherwise denies any chest pain, SOB, nausea, emesis, diarrhea, constipation, fevers, chills, night sweats, vision changes. CT revealed no evidence of acute intracranial disease and mild chronic small vessel ischemia. MRI revealed no acute intracranial abnormality. Mild to moderate for age nonspecific signal changes in the brain, most commonly due to chronic small vessel disease. Negative intracranial MRA.   Pertinent Vitals Pain Assessment: Faces Faces Pain Scale:  Hurts little more Pain Location: back-chronic Pain Descriptors / Indicators: Grimacing Pain Intervention(s): Monitored during session;Repositioned  SLP Plan  Discharge SLP treatment due to (comment)    Recommendations                Follow up Recommendations: None Plan: Discharge SLP treatment due to (comment)    Jorge [name] L. Jorge [name], [place] CCC/SLP Pager [phone]      Jorge MountsCouture, Jorge [name] 05/31/2014, 11:39 AM

## 2014-05-31 NOTE — Progress Notes (Signed)
Occupational Therapy Treatment Patient Details Name: Jorge [name] MRN: [id number] DOB: 06/02/1945 Today's Date: 05/31/2014    History of present illness Adm s/p fall with LOC; Lt sided weakness; NIH 3; MRI brain negative acute process  PMHx- '13 Lt CVA vs TIA with Lt CEA; post-op seizure; residual ?rt sided weakness (chart reports Lt, pt reports Rt); PAF, HTN, HLD. On 2/23 pt underwent cerbral arteriogram.   OT comments  Pt eager to return home to his dog.  Focus of session on education in home safety, transporting items with walker, standing activities at sink and toileting.  Pt continues to have some decreased safety awareness putting him at risk for falls.  Pt's family can provide 24 hour supervision upon discharge per his report.  Follow Up Recommendations  Home health OT    Equipment Recommendations       Recommendations for Other Services      Precautions / Restrictions Precautions Precautions: Fall Precaution Comments: pt with h/o falls including this admission.  Restrictions Weight Bearing Restrictions: No       Mobility Bed Mobility                 Transfers Overall transfer level: Needs assistance Equipment used: Rolling walker (2 wheeled) Transfers: Sit to/from Stand Sit to Stand: Supervision         General transfer comment: v/c's to push up from walker, increased time    Balance Overall balance assessment: Needs assistance         Standing balance support: Bilateral upper extremity supported Standing balance-Leahy Scale: Fair                     ADL Overall ADL's : Needs assistance/impaired     Grooming: Supervision/safety;Standing Grooming Details (indicate cue type and reason): cues to align walker with sink             Lower Body Dressing: Supervision/safety;Sit to/from stand Lower Body Dressing Details (indicate cue type and reason): able to donn socks crossing foot over opposite knee Toilet Transfer:  Supervision/safety;Ambulation;Comfort height toilet;Grab bars   Toileting- Clothing Manipulation and Hygiene: Supervision/safety;Sit to/from Nurse, children's Details (indicate cue type and reason): pt planning to purchase tub transfer bench locally Functional mobility during ADLs: Supervision/safety;Rolling walker General ADL Comments: Instructed in transporting items with RW safely, safe footwear, requesting assist for heavy housework, multiple uses of reacher and adaptive technique for feeding dog.      Vision                     Perception     Praxis      Cognition   Behavior During Therapy: WFL for tasks assessed/performed Overall Cognitive Status: Within Functional Limits for tasks assessed Area of Impairment: Safety/judgement;Memory     Memory: Decreased short-term memory    Safety/Judgement: Decreased awareness of safety          Extremity/Trunk Assessment               Exercises    Shoulder Instructions       General Comments      Pertinent Vitals/ Pain       Pain Assessment: Faces Pain Score: 4  Faces Pain Scale: Hurts little more Pain Location: back-chronic Pain Descriptors / Indicators: Grimacing Pain Intervention(s): Monitored during session;Repositioned  Home Living  Prior Functioning/Environment              Frequency Min 3X/week     Progress Toward Goals  OT Goals(current goals can now be found in the care plan section)  Progress towards OT goals: Progressing toward goals  Acute Rehab OT Goals Patient Stated Goal: return home  Plan Discharge plan remains appropriate    Co-evaluation                 End of Session     Activity Tolerance Patient tolerated treatment well   Patient Left in chair;with call bell/phone within reach;with chair alarm set   Nurse Communication          Time: [id number] OT Time Calculation (min): 35  min  Charges: OT General Charges $OT Visit: 1 Procedure OT Treatments $Self Care/Home Management : 23-37 mins  Evern BioMayberry, Jadriel Saxer Lynn 05/31/2014, 11:26 AM  [phone]

## 2014-06-02 DIAGNOSIS — I10 Essential (primary) hypertension: Secondary | ICD-10-CM | POA: Diagnosis not present

## 2014-06-02 DIAGNOSIS — I69354 Hemiplegia and hemiparesis following cerebral infarction affecting left non-dominant side: Secondary | ICD-10-CM | POA: Diagnosis not present

## 2014-06-02 DIAGNOSIS — R531 Weakness: Secondary | ICD-10-CM | POA: Diagnosis not present

## 2014-06-02 DIAGNOSIS — F329 Major depressive disorder, single episode, unspecified: Secondary | ICD-10-CM | POA: Diagnosis not present

## 2014-06-02 DIAGNOSIS — E785 Hyperlipidemia, unspecified: Secondary | ICD-10-CM | POA: Diagnosis not present

## 2014-06-02 DIAGNOSIS — I6521 Occlusion and stenosis of right carotid artery: Secondary | ICD-10-CM | POA: Diagnosis not present

## 2014-06-02 DIAGNOSIS — I480 Paroxysmal atrial fibrillation: Secondary | ICD-10-CM | POA: Diagnosis not present

## 2014-06-05 DIAGNOSIS — I480 Paroxysmal atrial fibrillation: Secondary | ICD-10-CM | POA: Diagnosis not present

## 2014-06-05 DIAGNOSIS — I10 Essential (primary) hypertension: Secondary | ICD-10-CM | POA: Diagnosis not present

## 2014-06-05 DIAGNOSIS — F329 Major depressive disorder, single episode, unspecified: Secondary | ICD-10-CM | POA: Diagnosis not present

## 2014-06-05 DIAGNOSIS — I69354 Hemiplegia and hemiparesis following cerebral infarction affecting left non-dominant side: Secondary | ICD-10-CM | POA: Diagnosis not present

## 2014-06-05 DIAGNOSIS — E785 Hyperlipidemia, unspecified: Secondary | ICD-10-CM | POA: Diagnosis not present

## 2014-06-05 DIAGNOSIS — I6521 Occlusion and stenosis of right carotid artery: Secondary | ICD-10-CM | POA: Diagnosis not present

## 2014-06-05 DIAGNOSIS — R531 Weakness: Secondary | ICD-10-CM | POA: Diagnosis not present

## 2014-06-07 DIAGNOSIS — I6521 Occlusion and stenosis of right carotid artery: Secondary | ICD-10-CM | POA: Diagnosis not present

## 2014-06-07 DIAGNOSIS — I10 Essential (primary) hypertension: Secondary | ICD-10-CM | POA: Diagnosis not present

## 2014-06-07 DIAGNOSIS — I5030 Unspecified diastolic (congestive) heart failure: Secondary | ICD-10-CM | POA: Diagnosis not present

## 2014-06-07 DIAGNOSIS — I480 Paroxysmal atrial fibrillation: Secondary | ICD-10-CM | POA: Diagnosis not present

## 2014-06-07 DIAGNOSIS — G459 Transient cerebral ischemic attack, unspecified: Secondary | ICD-10-CM | POA: Diagnosis not present

## 2014-06-08 DIAGNOSIS — I6521 Occlusion and stenosis of right carotid artery: Secondary | ICD-10-CM | POA: Diagnosis not present

## 2014-06-08 DIAGNOSIS — I10 Essential (primary) hypertension: Secondary | ICD-10-CM | POA: Diagnosis not present

## 2014-06-08 DIAGNOSIS — R531 Weakness: Secondary | ICD-10-CM | POA: Diagnosis not present

## 2014-06-08 DIAGNOSIS — I69354 Hemiplegia and hemiparesis following cerebral infarction affecting left non-dominant side: Secondary | ICD-10-CM | POA: Diagnosis not present

## 2014-06-08 DIAGNOSIS — E785 Hyperlipidemia, unspecified: Secondary | ICD-10-CM | POA: Diagnosis not present

## 2014-06-08 DIAGNOSIS — F329 Major depressive disorder, single episode, unspecified: Secondary | ICD-10-CM | POA: Diagnosis not present

## 2014-06-08 DIAGNOSIS — I480 Paroxysmal atrial fibrillation: Secondary | ICD-10-CM | POA: Diagnosis not present

## 2014-06-09 DIAGNOSIS — F329 Major depressive disorder, single episode, unspecified: Secondary | ICD-10-CM | POA: Diagnosis not present

## 2014-06-09 DIAGNOSIS — I480 Paroxysmal atrial fibrillation: Secondary | ICD-10-CM | POA: Diagnosis not present

## 2014-06-09 DIAGNOSIS — I69354 Hemiplegia and hemiparesis following cerebral infarction affecting left non-dominant side: Secondary | ICD-10-CM | POA: Diagnosis not present

## 2014-06-09 DIAGNOSIS — E785 Hyperlipidemia, unspecified: Secondary | ICD-10-CM | POA: Diagnosis not present

## 2014-06-09 DIAGNOSIS — R531 Weakness: Secondary | ICD-10-CM | POA: Diagnosis not present

## 2014-06-09 DIAGNOSIS — I6521 Occlusion and stenosis of right carotid artery: Secondary | ICD-10-CM | POA: Diagnosis not present

## 2014-06-09 DIAGNOSIS — I10 Essential (primary) hypertension: Secondary | ICD-10-CM | POA: Diagnosis not present

## 2014-06-12 DIAGNOSIS — I6521 Occlusion and stenosis of right carotid artery: Secondary | ICD-10-CM | POA: Diagnosis not present

## 2014-06-12 DIAGNOSIS — R531 Weakness: Secondary | ICD-10-CM | POA: Diagnosis not present

## 2014-06-12 DIAGNOSIS — I10 Essential (primary) hypertension: Secondary | ICD-10-CM | POA: Diagnosis not present

## 2014-06-12 DIAGNOSIS — I69354 Hemiplegia and hemiparesis following cerebral infarction affecting left non-dominant side: Secondary | ICD-10-CM | POA: Diagnosis not present

## 2014-06-12 DIAGNOSIS — E785 Hyperlipidemia, unspecified: Secondary | ICD-10-CM | POA: Diagnosis not present

## 2014-06-12 DIAGNOSIS — I480 Paroxysmal atrial fibrillation: Secondary | ICD-10-CM | POA: Diagnosis not present

## 2014-06-12 DIAGNOSIS — F329 Major depressive disorder, single episode, unspecified: Secondary | ICD-10-CM | POA: Diagnosis not present

## 2014-06-13 DIAGNOSIS — I480 Paroxysmal atrial fibrillation: Secondary | ICD-10-CM | POA: Diagnosis not present

## 2014-06-13 DIAGNOSIS — I10 Essential (primary) hypertension: Secondary | ICD-10-CM | POA: Diagnosis not present

## 2014-06-13 DIAGNOSIS — R531 Weakness: Secondary | ICD-10-CM | POA: Diagnosis not present

## 2014-06-13 DIAGNOSIS — E785 Hyperlipidemia, unspecified: Secondary | ICD-10-CM | POA: Diagnosis not present

## 2014-06-13 DIAGNOSIS — I6521 Occlusion and stenosis of right carotid artery: Secondary | ICD-10-CM | POA: Diagnosis not present

## 2014-06-13 DIAGNOSIS — F329 Major depressive disorder, single episode, unspecified: Secondary | ICD-10-CM | POA: Diagnosis not present

## 2014-06-13 DIAGNOSIS — I69354 Hemiplegia and hemiparesis following cerebral infarction affecting left non-dominant side: Secondary | ICD-10-CM | POA: Diagnosis not present

## 2014-06-26 ENCOUNTER — Other Ambulatory Visit: Payer: Self-pay | Admitting: Internal Medicine

## 2014-06-27 ENCOUNTER — Other Ambulatory Visit: Payer: Self-pay | Admitting: Internal Medicine

## 2014-06-28 DIAGNOSIS — R61 Generalized hyperhidrosis: Secondary | ICD-10-CM | POA: Diagnosis not present

## 2014-06-28 DIAGNOSIS — Z6841 Body Mass Index (BMI) 40.0 and over, adult: Secondary | ICD-10-CM | POA: Diagnosis not present

## 2014-06-28 DIAGNOSIS — R0600 Dyspnea, unspecified: Secondary | ICD-10-CM | POA: Diagnosis not present

## 2014-06-28 DIAGNOSIS — I6523 Occlusion and stenosis of bilateral carotid arteries: Secondary | ICD-10-CM | POA: Diagnosis not present

## 2014-06-28 NOTE — Consult Note (Signed)
NAME:  Jorge [name], Jorge [name]              ACCOUNT NO.:  [id number]  MEDICAL RECORD NO.:  [id number]  LOCATION:                                 FACILITY:  PHYSICIAN:  Nishka Heide K. Aileana Hodder, M.D.DATE OF BIRTH:  July 16, 1945  DATE OF CONSULTATION: DATE OF DISCHARGE:                                CONSULTATION   CLINICAL HISTORY:  Bilateral internal carotid artery stenosis.  EXAMINATION:  Intracranial interpretation of bilateral common carotid artery injections and right vertebral artery injection.  The right common carotid arteriogram demonstrates wide patency of the right internal carotid artery, in the cervical,and petrous segments.  There is mild fusiform dilatation of the proximal cavernous segment of the right internal carotid artery.  The supraclinoid segment is widely patent.  The right middle and right anterior cerebral arteries opacified normally into the capillary and venous phases.  The left common carotid arteriogram demonstrates a left internal carotid artery to be widely patent.   The cervical and the proximal  petrous junction are widely patent. There  is mild focal narrowing at the petrous cavernous junction associated with mild fusiform dilatation.  The supraclinoid segment is widely patent.  The left middle and the left anterior cerebral arteries opacified into the capillary and venous phases.  Patient's motion artifact precludes fine detail evaluation throughout this study.  The left vertebral artery origin is normal.  The vessel  Opacifies  to the clean skull base.    The right vertebral artery origin demonstrates mild narrowing.   Flow  Is noted into skull base.  IMPRESSION:  Mild fusiform dilatation of the proximal cavernous segment of the right internal carotid artery.  Suggestion of a mild narrowing of the left internal carotid artery, petrous cavernous junction associated with mild fusiform dilatation.  Mild narrowing of the origin of the right vertebral  artery.          ______________________________ Grandville SilosSanjeev K. Corliss Skainseveshwar, M.D.     SKD/MEDQ  D:  06/27/2014  T:  06/28/2014  Job:  [id number]

## 2014-07-26 ENCOUNTER — Ambulatory Visit: Payer: Self-pay | Admitting: Neurology

## 2014-08-16 DIAGNOSIS — I6350 Cerebral infarction due to unspecified occlusion or stenosis of unspecified cerebral artery: Secondary | ICD-10-CM | POA: Diagnosis not present

## 2014-08-16 DIAGNOSIS — I10 Essential (primary) hypertension: Secondary | ICD-10-CM | POA: Diagnosis not present

## 2014-08-16 DIAGNOSIS — I480 Paroxysmal atrial fibrillation: Secondary | ICD-10-CM | POA: Diagnosis not present

## 2014-08-16 DIAGNOSIS — E782 Mixed hyperlipidemia: Secondary | ICD-10-CM | POA: Diagnosis not present

## 2014-08-24 DIAGNOSIS — I517 Cardiomegaly: Secondary | ICD-10-CM | POA: Diagnosis not present

## 2014-08-24 DIAGNOSIS — I080 Rheumatic disorders of both mitral and aortic valves: Secondary | ICD-10-CM | POA: Diagnosis not present

## 2014-08-24 DIAGNOSIS — I480 Paroxysmal atrial fibrillation: Secondary | ICD-10-CM | POA: Diagnosis not present

## 2014-08-24 DIAGNOSIS — I272 Other secondary pulmonary hypertension: Secondary | ICD-10-CM | POA: Diagnosis not present

## 2014-09-20 DIAGNOSIS — Z79899 Other long term (current) drug therapy: Secondary | ICD-10-CM | POA: Diagnosis not present

## 2014-09-20 DIAGNOSIS — Z125 Encounter for screening for malignant neoplasm of prostate: Secondary | ICD-10-CM | POA: Diagnosis not present

## 2014-09-20 DIAGNOSIS — R7309 Other abnormal glucose: Secondary | ICD-10-CM | POA: Diagnosis not present

## 2014-09-20 DIAGNOSIS — E782 Mixed hyperlipidemia: Secondary | ICD-10-CM | POA: Diagnosis not present

## 2014-09-22 DIAGNOSIS — F329 Major depressive disorder, single episode, unspecified: Secondary | ICD-10-CM | POA: Diagnosis not present

## 2014-09-22 DIAGNOSIS — Z9181 History of falling: Secondary | ICD-10-CM | POA: Diagnosis not present

## 2014-09-22 DIAGNOSIS — I10 Essential (primary) hypertension: Secondary | ICD-10-CM | POA: Diagnosis not present

## 2014-09-22 DIAGNOSIS — E782 Mixed hyperlipidemia: Secondary | ICD-10-CM | POA: Diagnosis not present

## 2014-09-22 DIAGNOSIS — R609 Edema, unspecified: Secondary | ICD-10-CM | POA: Diagnosis not present

## 2014-11-14 DIAGNOSIS — I480 Paroxysmal atrial fibrillation: Secondary | ICD-10-CM | POA: Diagnosis not present

## 2014-11-14 DIAGNOSIS — Z9181 History of falling: Secondary | ICD-10-CM | POA: Diagnosis not present

## 2014-11-14 DIAGNOSIS — I5030 Unspecified diastolic (congestive) heart failure: Secondary | ICD-10-CM | POA: Diagnosis not present

## 2014-11-14 DIAGNOSIS — R531 Weakness: Secondary | ICD-10-CM | POA: Diagnosis not present

## 2014-11-14 DIAGNOSIS — G5791 Unspecified mononeuropathy of right lower limb: Secondary | ICD-10-CM | POA: Diagnosis not present

## 2014-11-20 DIAGNOSIS — I5030 Unspecified diastolic (congestive) heart failure: Secondary | ICD-10-CM | POA: Diagnosis not present

## 2014-11-20 DIAGNOSIS — R531 Weakness: Secondary | ICD-10-CM | POA: Diagnosis not present

## 2014-11-20 DIAGNOSIS — G5791 Unspecified mononeuropathy of right lower limb: Secondary | ICD-10-CM | POA: Diagnosis not present

## 2014-11-20 DIAGNOSIS — I480 Paroxysmal atrial fibrillation: Secondary | ICD-10-CM | POA: Diagnosis not present

## 2014-11-23 DIAGNOSIS — R531 Weakness: Secondary | ICD-10-CM | POA: Diagnosis not present

## 2014-11-23 DIAGNOSIS — G5791 Unspecified mononeuropathy of right lower limb: Secondary | ICD-10-CM | POA: Diagnosis not present

## 2014-11-23 DIAGNOSIS — I5030 Unspecified diastolic (congestive) heart failure: Secondary | ICD-10-CM | POA: Diagnosis not present

## 2014-11-23 DIAGNOSIS — I480 Paroxysmal atrial fibrillation: Secondary | ICD-10-CM | POA: Diagnosis not present

## 2014-11-27 DIAGNOSIS — I480 Paroxysmal atrial fibrillation: Secondary | ICD-10-CM | POA: Diagnosis not present

## 2014-11-27 DIAGNOSIS — I5030 Unspecified diastolic (congestive) heart failure: Secondary | ICD-10-CM | POA: Diagnosis not present

## 2014-11-27 DIAGNOSIS — R531 Weakness: Secondary | ICD-10-CM | POA: Diagnosis not present

## 2014-11-27 DIAGNOSIS — G5791 Unspecified mononeuropathy of right lower limb: Secondary | ICD-10-CM | POA: Diagnosis not present

## 2014-11-28 ENCOUNTER — Other Ambulatory Visit: Payer: Self-pay | Admitting: *Deleted

## 2014-11-28 DIAGNOSIS — I6523 Occlusion and stenosis of bilateral carotid arteries: Secondary | ICD-10-CM

## 2014-11-28 DIAGNOSIS — M79604 Pain in right leg: Secondary | ICD-10-CM

## 2014-11-29 DIAGNOSIS — R531 Weakness: Secondary | ICD-10-CM | POA: Diagnosis not present

## 2014-11-29 DIAGNOSIS — I480 Paroxysmal atrial fibrillation: Secondary | ICD-10-CM | POA: Diagnosis not present

## 2014-11-29 DIAGNOSIS — G5791 Unspecified mononeuropathy of right lower limb: Secondary | ICD-10-CM | POA: Diagnosis not present

## 2014-11-29 DIAGNOSIS — I5030 Unspecified diastolic (congestive) heart failure: Secondary | ICD-10-CM | POA: Diagnosis not present

## 2014-11-30 ENCOUNTER — Ambulatory Visit: Payer: Medicare Other | Admitting: Vascular Surgery

## 2014-11-30 ENCOUNTER — Other Ambulatory Visit (HOSPITAL_COMMUNITY): Payer: Medicare Other

## 2014-12-21 ENCOUNTER — Encounter (HOSPITAL_COMMUNITY): Payer: Medicare Other

## 2014-12-21 ENCOUNTER — Ambulatory Visit: Payer: Medicare Other | Admitting: Vascular Surgery

## 2015-02-02 DIAGNOSIS — Z23 Encounter for immunization: Secondary | ICD-10-CM | POA: Diagnosis not present

## 2015-02-28 DIAGNOSIS — M1711 Unilateral primary osteoarthritis, right knee: Secondary | ICD-10-CM | POA: Diagnosis not present

## 2015-02-28 DIAGNOSIS — M2242 Chondromalacia patellae, left knee: Secondary | ICD-10-CM | POA: Diagnosis not present

## 2015-02-28 DIAGNOSIS — M1712 Unilateral primary osteoarthritis, left knee: Secondary | ICD-10-CM | POA: Diagnosis not present

## 2015-02-28 DIAGNOSIS — M2241 Chondromalacia patellae, right knee: Secondary | ICD-10-CM | POA: Diagnosis not present

## 2015-03-19 DIAGNOSIS — E782 Mixed hyperlipidemia: Secondary | ICD-10-CM | POA: Diagnosis not present

## 2015-03-19 DIAGNOSIS — Z79899 Other long term (current) drug therapy: Secondary | ICD-10-CM | POA: Diagnosis not present

## 2015-03-19 DIAGNOSIS — R7303 Prediabetes: Secondary | ICD-10-CM | POA: Diagnosis not present

## 2015-03-23 DIAGNOSIS — R609 Edema, unspecified: Secondary | ICD-10-CM | POA: Diagnosis not present

## 2015-03-23 DIAGNOSIS — E782 Mixed hyperlipidemia: Secondary | ICD-10-CM | POA: Diagnosis not present

## 2015-03-23 DIAGNOSIS — F329 Major depressive disorder, single episode, unspecified: Secondary | ICD-10-CM | POA: Diagnosis not present

## 2015-03-23 DIAGNOSIS — I10 Essential (primary) hypertension: Secondary | ICD-10-CM | POA: Diagnosis not present

## 2015-03-23 DIAGNOSIS — Z1389 Encounter for screening for other disorder: Secondary | ICD-10-CM | POA: Diagnosis not present

## 2015-04-15 NOTE — Progress Notes (Signed)
Patient ID: Jorge [name], male   DOB: [birth year], 71 y.o.   MRN: [id number] 70 y.o. referred by Dr Nathanial Rancher for multiple issues in 2013 .  First seen by Dr Deborah Chalk in 2001 for chest pain Cath with no CAD.  Seen by Dr Ladona Ridgel and Jens Som in 2004/2005.  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  Last seen by Dr Tenny Craw in 2008.  Dobutamine myovue nonischemic.  Echo EF 65% with mean gradient 22 and peak .  Has had increasing dyspnea and fatigue.  Chronic LE edema on lasix.  Smokes 3-4ppd until 8 and has clinical COPD.  Uses CPAP  for OSA.  When he gets dyspnic can get pressure in chest.  Activity limited by neuropathy and obesity.  Has gained about 30 lbs this year and is over 360lbs.  Poor diet.  Compliant with meds.  .  History of PAF  Briefly on coumadin but ? Hematoma and at one time listed as allergy.  Also previous heavy ETOH.    Carotid 05/2014  60-70% RICA post L CEA patent  06/2014 cerebral angiogram no stenosis  Echo:  normal EF 60%  Myovue 03/2012  Normal no ischemia   05/2014 admitted with more left sided weakness negative w/u CHADVASC 5 started on eliquis history of PAF In hospital ? PAF vs MAT  ROS: Denies fever, malais, weight loss, blurry vision, decreased visual acuity, cough, sputum, SOB, hemoptysis, pleuritic pain, palpitaitons, heartburn, abdominal pain, melena, lower extremity edema, claudication, or rash.  All other systems reviewed and negative   General: Affect appropriate Obese chroncially ill male HEENT: normal Neck supple with no adenopathy JVP normal left bruit with CEA no thyromegaly Lungs clear with no whbruitseezing and good diaphragmatic motion Heart:  S1/S2 distant but SEM murmur,rub, gallop or click PMI not palpable Abdomen: benighn, BS positve, no tenderness, no AAA no bruit.  No HSM or HJR Distal pulses intact with no bruits Plus 2 bilateral  edema Neuro non-focal Skin warm and dry No muscular weakness  Medications Current Outpatient  Prescriptions  Medication Sig Dispense Refill  . acetaminophen (TYLENOL) 500 MG tablet Take 1,000 mg by mouth every 6 (six) hours as needed for headache (pain).    Marland Kitchen ALPRAZolam (XANAX) 0.5 MG tablet Take 0.5 mg by mouth at bedtime as needed for sleep.     Marland Kitchen apixaban (ELIQUIS) 5 MG TABS tablet Take 1 tablet (5 mg total) by mouth 2 (two) times daily. 60 tablet 0  . aspirin 81 MG tablet Take 81 mg by mouth daily.    Marland Kitchen atorvastatin (LIPITOR) 40 MG tablet Take 1 tablet (40 mg total) by mouth daily at 6 PM. 30 tablet 0  . buPROPion (WELLBUTRIN SR) 150 MG 12 hr tablet Take 150 mg by mouth daily.    Marland Kitchen gabapentin (NEURONTIN) 800 MG tablet Take 800 mg by mouth 2 (two) times daily.    . metoprolol tartrate (LOPRESSOR) 25 MG tablet Take 0.5 tablets (12.5 mg total) by mouth 2 (two) times daily. 30 tablet 0  . sertraline (ZOLOFT) 100 MG tablet Take 100 mg by mouth daily.     No current facility-administered medications for this visit.    Allergies Coumadin  Family History: No family history on file.  Social History: Social History   Social History  . Marital Status: Divorced    Spouse Name: N/A  . Number of Children: N/A  . Years of Education: N/A   Occupational History  . Not on file.  Social History Main Topics  . Smoking status: Former Games developermoker  . Smokeless tobacco: Former NeurosurgeonUser    Quit date: 03/22/1996  . Alcohol Use: No  . Drug Use: No  . Sexual Activity: Not on file   Other Topics Concern  . Not on file   Social History Narrative    Electrocardiogram:  03/15/14   [facility]  Assessment and Plan CVA: PAF Anticoagulation Chol  Charlton HawsPeter Jill Ruppe

## 2015-04-17 ENCOUNTER — Encounter: Payer: Self-pay | Admitting: Cardiovascular Disease

## 2015-05-08 ENCOUNTER — Inpatient Hospital Stay (HOSPITAL_COMMUNITY)
Admission: EM | Admit: 2015-05-08 | Discharge: 2015-05-10 | DRG: 389 | Disposition: A | Discharge status: Home Health | Payer: Medicare Other | Attending: Internal Medicine | Admitting: Internal Medicine

## 2015-05-08 ENCOUNTER — Emergency Department (HOSPITAL_COMMUNITY): Payer: Medicare Other

## 2015-05-08 ENCOUNTER — Encounter (HOSPITAL_COMMUNITY): Payer: Self-pay

## 2015-05-08 DIAGNOSIS — Z8673 Personal history of transient ischemic attack (TIA), and cerebral infarction without residual deficits: Secondary | ICD-10-CM | POA: Diagnosis not present

## 2015-05-08 DIAGNOSIS — I480 Paroxysmal atrial fibrillation: Secondary | ICD-10-CM | POA: Diagnosis not present

## 2015-05-08 DIAGNOSIS — I2510 Atherosclerotic heart disease of native coronary artery without angina pectoris: Secondary | ICD-10-CM | POA: Diagnosis not present

## 2015-05-08 DIAGNOSIS — G629 Polyneuropathy, unspecified: Secondary | ICD-10-CM | POA: Diagnosis not present

## 2015-05-08 DIAGNOSIS — Z888 Allergy status to other drugs, medicaments and biological substances status: Secondary | ICD-10-CM

## 2015-05-08 DIAGNOSIS — E782 Mixed hyperlipidemia: Secondary | ICD-10-CM | POA: Diagnosis present

## 2015-05-08 DIAGNOSIS — R1013 Epigastric pain: Secondary | ICD-10-CM | POA: Diagnosis not present

## 2015-05-08 DIAGNOSIS — R112 Nausea with vomiting, unspecified: Secondary | ICD-10-CM | POA: Diagnosis present

## 2015-05-08 DIAGNOSIS — E785 Hyperlipidemia, unspecified: Secondary | ICD-10-CM | POA: Diagnosis present

## 2015-05-08 DIAGNOSIS — Z6841 Body Mass Index (BMI) 40.0 and over, adult: Secondary | ICD-10-CM

## 2015-05-08 DIAGNOSIS — I5032 Chronic diastolic (congestive) heart failure: Secondary | ICD-10-CM | POA: Diagnosis not present

## 2015-05-08 DIAGNOSIS — Z87891 Personal history of nicotine dependence: Secondary | ICD-10-CM | POA: Diagnosis not present

## 2015-05-08 DIAGNOSIS — G451 Carotid artery syndrome (hemispheric): Secondary | ICD-10-CM | POA: Diagnosis not present

## 2015-05-08 DIAGNOSIS — R109 Unspecified abdominal pain: Secondary | ICD-10-CM | POA: Diagnosis not present

## 2015-05-08 DIAGNOSIS — K5669 Other intestinal obstruction: Secondary | ICD-10-CM | POA: Diagnosis not present

## 2015-05-08 DIAGNOSIS — I5030 Unspecified diastolic (congestive) heart failure: Secondary | ICD-10-CM

## 2015-05-08 DIAGNOSIS — K567 Ileus, unspecified: Secondary | ICD-10-CM | POA: Diagnosis not present

## 2015-05-08 DIAGNOSIS — I10 Essential (primary) hypertension: Secondary | ICD-10-CM | POA: Diagnosis present

## 2015-05-08 DIAGNOSIS — I509 Heart failure, unspecified: Secondary | ICD-10-CM

## 2015-05-08 DIAGNOSIS — K2970 Gastritis, unspecified, without bleeding: Secondary | ICD-10-CM | POA: Diagnosis not present

## 2015-05-08 HISTORY — DX: Obesity, unspecified: E66.9

## 2015-05-08 HISTORY — DX: Heart failure, unspecified: I50.9

## 2015-05-08 LAB — COMPREHENSIVE METABOLIC PANEL
ALK PHOS: 76 U/L (ref 38–126)
ALT: 15 U/L — AB (ref 17–63)
AST: 17 U/L (ref 15–41)
Albumin: 3.5 g/dL (ref 3.5–5.0)
Anion gap: 14 (ref 5–15)
BILIRUBIN TOTAL: 0.6 mg/dL (ref 0.3–1.2)
BUN: 14 mg/dL (ref 6–20)
CALCIUM: 8.9 mg/dL (ref 8.9–10.3)
CHLORIDE: 99 mmol/L — AB (ref 101–111)
CO2: 27 mmol/L (ref 22–32)
CREATININE: 1.16 mg/dL (ref 0.61–1.24)
Glucose, Bld: 119 mg/dL — ABNORMAL HIGH (ref 65–99)
Potassium: 3.4 mmol/L — ABNORMAL LOW (ref 3.5–5.1)
Sodium: 140 mmol/L (ref 135–145)
Total Protein: 6.6 g/dL (ref 6.5–8.1)

## 2015-05-08 LAB — CBC WITH DIFFERENTIAL/PLATELET
Basophils Absolute: 0 10*3/uL (ref 0.0–0.1)
Basophils Relative: 0 %
Eosinophils Absolute: 0.1 10*3/uL (ref 0.0–0.7)
Eosinophils Relative: 1 %
HCT: 41.4 % (ref 39.0–52.0)
HEMOGLOBIN: 13.8 g/dL (ref 13.0–17.0)
Lymphocytes Relative: 11 %
Lymphs Abs: 1.2 10*3/uL (ref 0.7–4.0)
MCH: 30.3 pg (ref 26.0–34.0)
MCHC: 33.3 g/dL (ref 30.0–36.0)
MCV: 91 fL (ref 78.0–100.0)
MONO ABS: 1 10*3/uL (ref 0.1–1.0)
MONOS PCT: 9 %
Neutro Abs: 8.5 10*3/uL — ABNORMAL HIGH (ref 1.7–7.7)
Neutrophils Relative %: 79 %
Platelets: 275 10*3/uL (ref 150–400)
RBC: 4.55 MIL/uL (ref 4.22–5.81)
RDW: 13.8 % (ref 11.5–15.5)
WBC: 10.8 10*3/uL — ABNORMAL HIGH (ref 4.0–10.5)

## 2015-05-08 LAB — LIPASE, BLOOD: LIPASE: 39 U/L (ref 11–51)

## 2015-05-08 LAB — TROPONIN I

## 2015-05-08 LAB — HEMOGLOBIN AND HEMATOCRIT, BLOOD
HEMATOCRIT: 40.3 % (ref 39.0–52.0)
Hemoglobin: 13.2 g/dL (ref 13.0–17.0)

## 2015-05-08 LAB — MAGNESIUM: MAGNESIUM: 2.3 mg/dL (ref 1.7–2.4)

## 2015-05-08 MED ORDER — GABAPENTIN 400 MG PO CAPS
800.0000 mg | ORAL_CAPSULE | Freq: Two times a day (BID) | ORAL | Status: DC
  Administered 2015-05-08 – 2015-05-10 (×5): 800 mg via ORAL
  Filled 2015-05-08 (×5): qty 2

## 2015-05-08 MED ORDER — ENSURE ENLIVE PO LIQD: 237.0000 mL | Freq: Two times a day (BID) | ORAL | Status: DC

## 2015-05-08 MED ORDER — IOHEXOL 300 MG/ML  SOLN
100.0000 mL | Freq: Once | INTRAMUSCULAR | Status: AC | PRN
  Administered 2015-05-08: 100 mL via INTRAVENOUS

## 2015-05-08 MED ORDER — ONDANSETRON HCL 4 MG/2ML IJ SOLN
4.0000 mg | Freq: Four times a day (QID) | INTRAMUSCULAR | Status: DC | PRN
  Administered 2015-05-08: 4 mg via INTRAVENOUS
  Filled 2015-05-08: qty 2

## 2015-05-08 MED ORDER — ONDANSETRON HCL 4 MG/2ML IJ SOLN
4.0000 mg | Freq: Once | INTRAMUSCULAR | Status: AC
  Administered 2015-05-08: 4 mg via INTRAVENOUS
  Filled 2015-05-08: qty 2

## 2015-05-08 MED ORDER — ALPRAZOLAM 0.5 MG PO TABS
0.5000 mg | ORAL_TABLET | Freq: Every evening | ORAL | Status: DC | PRN
  Administered 2015-05-08 – 2015-05-09 (×2): 0.5 mg via ORAL
  Filled 2015-05-08 (×2): qty 1

## 2015-05-08 MED ORDER — MORPHINE SULFATE (PF) 4 MG/ML IV SOLN
4.0000 mg | Freq: Once | INTRAVENOUS | Status: AC
  Administered 2015-05-08: 4 mg via INTRAVENOUS
  Filled 2015-05-08: qty 1

## 2015-05-08 MED ORDER — ACETAMINOPHEN 650 MG RE SUPP: 650.0000 mg | Freq: Four times a day (QID) | RECTAL | Status: DC | PRN

## 2015-05-08 MED ORDER — SODIUM CHLORIDE 0.45 % IV SOLN
INTRAVENOUS | Status: AC
  Administered 2015-05-08 (×2): via INTRAVENOUS

## 2015-05-08 MED ORDER — ACETAMINOPHEN 325 MG PO TABS
650.0000 mg | ORAL_TABLET | Freq: Four times a day (QID) | ORAL | Status: DC | PRN
  Administered 2015-05-08 – 2015-05-09 (×2): 650 mg via ORAL
  Filled 2015-05-08 (×2): qty 2

## 2015-05-08 MED ORDER — ONDANSETRON HCL 4 MG PO TABS: 4.0000 mg | ORAL_TABLET | Freq: Four times a day (QID) | ORAL | Status: DC | PRN

## 2015-05-08 MED ORDER — MORPHINE SULFATE (PF) 2 MG/ML IV SOLN
1.0000 mg | INTRAVENOUS | Status: DC | PRN
  Administered 2015-05-08 – 2015-05-09 (×3): 1 mg via INTRAVENOUS
  Filled 2015-05-08 (×3): qty 1

## 2015-05-08 MED ORDER — PANTOPRAZOLE SODIUM 40 MG IV SOLR
40.0000 mg | Freq: Once | INTRAVENOUS | Status: AC
  Administered 2015-05-08: 40 mg via INTRAVENOUS
  Filled 2015-05-08: qty 40

## 2015-05-08 MED ORDER — MORPHINE SULFATE (PF) 2 MG/ML IV SOLN
1.0000 mg | Freq: Once | INTRAVENOUS | Status: AC
  Administered 2015-05-08: 1 mg via INTRAVENOUS
  Filled 2015-05-08: qty 1

## 2015-05-08 MED ORDER — APIXABAN 5 MG PO TABS
5.0000 mg | ORAL_TABLET | Freq: Two times a day (BID) | ORAL | Status: DC
  Administered 2015-05-08 – 2015-05-10 (×5): 5 mg via ORAL
  Filled 2015-05-08 (×5): qty 1

## 2015-05-08 MED ORDER — SODIUM CHLORIDE 0.9 % IV BOLUS (SEPSIS)
500.0000 mL | Freq: Once | INTRAVENOUS | Status: AC
  Administered 2015-05-08: 500 mL via INTRAVENOUS

## 2015-05-08 NOTE — Plan of Care (Signed)
70 year old male with history of atrial fibrillation and TIA presents here because of abdominal pain and nausea. CT scan shows dilated bowel concerning for possible ileus versus obstruction. There is no definite obstruction the CAT scan.  Jorge [name].

## 2015-05-08 NOTE — Progress Notes (Signed)
Received report on patient from Windell Moulding, RN from ED. Awaiting patient to be transferred to 5W.

## 2015-05-08 NOTE — H&P (Signed)
[facility] History and Physical  JAYSHAUN PHILLIPS ZOX:[id number] DOB: 03-28-1946 DOA: 05/08/2015  Referring physician: Toniann Fail PCP: Ailene Ravel, MD   Chief Complaint: abdominal pain/nause/vomiting  HPI: Jorge [name] is a very pleasant 70 y.o. male with past medical hx afib, tia, cad, htn, chf, as, resents to the emergency department with the chief complaint of persistent worsening substernal and epigastric abdominal pain for 5 days. Initial evaluation includes abdominal CT revealing dilated bowel concerning for possible ileus versus obstruction.  Information obtained from patient reports 5 days ago he developed nausea with vomiting. He reports several episodes of watery bile colored emesis initially and yesterday one episode of emesis that looked somewhat coffee ground. 3 days ago he developed abdominal distention with intermittent pain. He reports the pain is sharp diffuse but concentrated in the right lower quadrant. He reports passing gas and having 2 episodes of loose stool prior to coming to the hospital. He denies any chest pain palpitations headache dizziness fever chills. He denies any dysuria hematuria but does complain of frequency and urgency.  In the emergency department he is afebrile and hemodynamically stable loss of blood pressure somewhat soft. He is provided with IV fluids and Zofran and morphine.   Review of Systems:  10 point review of systems complete and all systems are negative except as indicated in the history of present illness  Past Medical History  Diagnosis Date  . HTN (hypertension)   . Hyperlipidemia   . Edema   . Chest pain   . SOB (shortness of breath)   . Neuropathy (HCC)   . Stroke (HCC)   . CHF (congestive heart failure) (HCC)   . Obesity    Past Surgical History  Procedure Laterality Date  . Cardiac catheterization    . Back surgery    . Carotid angiogram N/A 05/30/2014    Procedure: Dorise Bullion;  Surgeon: Nada Libman, MD;  [facility]: [name] CATH LAB;  Service: Cardiovascular;  Laterality: N/A;   Social History:  reports that he has quit smoking. He quit smokeless tobacco use about 19 years ago. He reports that he does not drink alcohol or use illicit drugs. Lives alone at home independent with ADLs no recent falls Allergies  Allergen Reactions  . Coumadin [Warfarin Sodium] Rash    History reviewed. No pertinent family history. family medical history discussed mother deceased history of CAD MI diabetes father deceased history of kidney disease  Prior to Admission medications   Medication Sig Start Date End Date Taking? Authorizing Provider  acetaminophen (TYLENOL) 500 MG tablet Take 1,000 mg by mouth every 6 (six) hours as needed for headache (pain).   Yes Historical Provider, MD  ALPRAZolam Prudy Feeler) 0.5 MG tablet Take 0.5 mg by mouth at bedtime as needed for sleep.    Yes Historical Provider, MD  apixaban (ELIQUIS) 5 MG TABS tablet Take 1 tablet (5 mg total) by mouth 2 (two) times daily. 05/31/14  Yes Lorenda Hatchet, MD  atorvastatin (LIPITOR) 40 MG tablet Take 1 tablet (40 mg total) by mouth daily at 6 PM. 05/31/14  Yes Lorenda Hatchet, MD  buPROPion [facility] SR) 150 MG 12 hr tablet Take 150 mg by mouth daily.   Yes Historical Provider, MD  gabapentin (NEURONTIN) 800 MG tablet Take 800 mg by mouth 2 (two) times daily.   Yes Historical Provider, MD  metoprolol tartrate (LOPRESSOR) 25 MG tablet Take 0.5 tablets (12.5 mg total) by mouth 2 (two) times daily. 05/31/14  Yes  Adam L Rothman, MD  sertraline (ZOLOFT) 100 MG tablet Take 100 mg by mouth daily.   Yes Historical Provider, MD   Physical Exam: Filed Vitals:   05/08/15 0400 05/08/15 0415 05/08/15 0430 05/08/15 0539  BP: 117/47 120/60 126/56 106/52  Pulse: 69 69 69 73  Temp:    98.6 F (37 C)  TempSrc:    Oral  Resp: Height:     (1.88 m)  Weight:    150.231 kg (331 lb 3.2 oz)  SpO2: 94% 93% 92% 95%    Wt Readings from Last 3  Encounters:  05/08/15 150.231 kg (331 lb 3.2 oz)  05/24/14 157.5 kg (347 lb 3.6 oz)  03/29/12 158.759 kg (350 lb)    General:  Appears obese calm and slightly uncomfortable Eyes: PERRL, normal lids, irises & conjunctiva ENT: grossly normal hearing, lips & tongue mucous membranes of his mouth are pink slightly dry Neck: no LAD, masses or thyromegaly Cardiovascular: RRR, no m/r/g. No LE edema.  Respiratory: CTA bilaterally, no w/r/r. Normal respiratory effort. Abdomen: Moderate distention and firmness particularly in the upper quadrants. Moderate tenderness right lower quadrant to palpation. No bowel sounds in upper quadrants very sluggish in the lower quadrants Skin: no rash or induration seen on limited exam Musculoskeletal: grossly normal tone BUE/BLE Psychiatric: grossly normal mood and affect, speech fluent and appropriate Neurologic: grossly non-focal. Each clear facial symmetry           Labs on Admission:  Basic Metabolic Panel:  Recent Labs Lab 05/08/15 0218  NA 140  K 3.4*  CL 99*  CO2 27  GLUCOSE 119*  BUN 14  CREATININE 1.16  CALCIUM 8.9   Liver Function Tests:  Recent Labs Lab 05/08/15 0218  AST 17  ALT 15*  ALKPHOS 76  BILITOT 0.6  PROT 6.6  ALBUMIN 3.5    Recent Labs Lab 05/08/15 0218  LIPASE 39   No results for input(s): AMMONIA in the last 168 hours. CBC:  Recent Labs Lab 05/08/15 0218  WBC 10.8*  NEUTROABS 8.5*  HGB 13.8  HCT 41.4  MCV 91.0  PLT 275   Cardiac Enzymes:  Recent Labs Lab 05/08/15 0218  TROPONINI <0.03    BNP (last 3 results) No results for input(s): BNP in the last 8760 hours.  ProBNP (last 3 results) No results for input(s): PROBNP in the last 8760 hours.  CBG: No results for input(s): GLUCAP in the last 168 hours.  Radiological Exams on Admission: Ct Abdomen Pelvis W Contrast  05/08/2015  CLINICAL DATA:  Possible small bowel obstruction on x-ray. EXAM: CT ABDOMEN AND PELVIS WITH CONTRAST TECHNIQUE:  Multidetector CT imaging of the abdomen and pelvis was performed using the standard protocol following bolus administration of intravenous contrast. CONTRAST:  OMNIPAQUE IOHEXOL 300 MG/ML  SOLN COMPARISON:  None. FINDINGS: Lower chest and abdominal wall:  Fatty right inguinal hernia. Right basilar atelectasis. Hepatobiliary: No focal liver abnormality.No evidence of biliary obstruction or stone. Pancreas: Unremarkable. Spleen: Sub cm low-density in the lower subcapsular spleen of doubtful clinical significance. Adrenals/Urinary Tract: Negative adrenals. No hydronephrosis or stone. Unremarkable bladder. Reproductive:Enlarged central prostate projecting into the bladder base. Stomach/Bowel: Small bowel is gas and fluid dilated, affecting the proximal small bowel more than the ileum, but without focal transition point. Gas and stool present throughout the colon. 2 cm fatty strucLorenda Hatchetiorly at the pyloric region appears submucosal, consistent with incidental lipoma rather than ingested lipid. Vascular/Lymphatic: No acute vascular abnormality.  No mass or adenopathy. Peritoneal: No ascites or pneumoperitoneum. Musculoskeletal: Advanced and diffuse disc degeneration with L5-S1 discectomy and solid bony fusion. IMPRESSION: 1. Dilated small bowel and fluid levels but no transition point to suggest mechanical obstruction. Are there risk factors for ileus? 2. Right basilar atelectasis. 3. Incidental findings described above. Electronically Signed   By: Marnee Spring M.D.   On: 05/08/2015 04:03   Dg Abd Acute W/chest  05/08/2015  CLINICAL DATA:  Abdominal and epigastric pain since Thursday. EXAM: DG ABDOMEN ACUTE W/ 1V CHEST COMPARISON:  Chest x-ray 05/27/2014 FINDINGS: Dilated small bowel with fluid levels, out of proportion to colonic distention. There is moderate amount of colonic gas, favoring a partial obstruction. No pneumoperitoneum identified. No concerning intra-abdominal mass effect or calcification.  Chronic elevation the right diaphragm. There is no edema, consolidation, effusion, or pneumothorax. Normal heart size and stable mediastinal contours. IMPRESSION: Probable small bowel obstruction, with volume of colonic gas favoring partial obstruction. Electronically Signed   By: Marnee Spring M.D.   On: 05/08/2015 01:59    EKG: Independently reviewed. Sinus rhythm Probable left atrial enlargement Low voltage, precordial leads LVH with secondary repolarization abnormality  Assessment/Plan Principal Problem:   Ileus (HCC) Active Problems:   CAD (coronary artery disease)   Hemispheric carotid artery syndrome   Paroxysmal atrial fibrillation (HCC)   HTN (hypertension)   Hyperlipidemia   CHF (congestive heart failure) (HCC)   Abdominal pain   Nausea and vomiting  #1. Ileus versus small bowel obstruction. CT as noted above. Infectious process vs adheasionns. Lipase within the limits of normal. Initial troponin negative. No Further emesis since presentation -Admit to medical floor -Bowel rest -Ambulated in hall twice a day -Supportive therapy anti-emetic analgesia -If no improvement consider NG tube  #2. Nausea vomiting. Likely related to #1. Reports one episode of coffee ground emesis. Low suspicion for GI bleed.  Patient is on eliquis. Hemoglobin is stable. No further emesis since admission -Bowel rest -IV fluids -anti-emetic -check any further emesis for blood - H & H every 6  #3. CAD. No chest pain. Initial troponin negative.eKG without acute changes -Continue Lipitor -Hold beta blocker for now do to slightly soft blood pressure  #4. Paroxysmal A. Fib/hx tia/carotid endarterectomy. chadvasc score 5.  On eliquis. EKG sinus rhythm -continue Eliquis -hold BB for now  5. Hypertension. Blood pressure somewhat soft on admission -We'll hold beta blocker for now -Monitor blood pressure closely and resume as indicated  #6. Chronic diastolic heart failure. Echo in February 2016  with an EF of 6065% and grade 1 diastolic dysfunction. Some concern related -Daily weight -Intake and output -Beta blocker for now as noted above resume as indicated    Code Status: full DVT Prophylaxis: Family Communication: none present Disposition Plan: home when ready  Time spent: 49 minuters  [facility]

## 2015-05-08 NOTE — Progress Notes (Signed)
Patient complains of pain 7/10 over abdomen. Toniann Fail MD paged and notified. MD called back and placed telephone order with read-back for 1 mg of IV Morphine once. Will carry out order.

## 2015-05-08 NOTE — Progress Notes (Addendum)
NURSING PROGRESS NOTE  Jorge [name] MRN: [id number]  Admission Data: 05/08/2015 5:33 AM Attending Provider: Eduard Clos, MD  PCP: Ailene Ravel, MD  Code status: None  Allergies:  Allergies  Allergen Reactions  . Coumadin [Warfarin Sodium] Rash     Past Medical History:  has a past medical history of HTN (hypertension); Hyperlipidemia; Edema; Chest pain; SOB (shortness of breath); Neuropathy (HCC); Stroke [facility]); and CHF (congestive heart failure) (HCC).   Past Surgical History:  has past surgical history that includes Cardiac catheterization; Back surgery; and carotid angiogram (N/A, 05/30/2014).   Jorge [name] is a 70 y.o. male patient, arrived to floor in room [phone] via stretcher, transferred from ED. Patient alert and oriented X 4. No acute distress noted. Complains of pain 7/10 over abdomen, describing as intermittent discomfort.   Vital signs: Oral temperature 98.6 F (37 C), Blood pressure 106/52, Pulse 73, RR 18, SpO2 95 % on room air. Height 6'2" (188 cm), weight 331.2 lbs (150.2 kg).   Cardiac monitoring: None  IV access: Right antecubital; condition patent and no redness.  Skin: intact, no pressure ulcer noted in sacral area.   Patient's ID armband verified with patient/ family, and in place. Information packet given to patient/ family. Fall risk assessed, SR up X2, patient/ family able to verbalize understanding of risks associated with falls and to call nurse or staff to assist before getting out of bed. Patient/ family oriented to room and equipment. Call bell within reach.

## 2015-05-08 NOTE — ED Provider Notes (Signed)
CSN: [id number]     Arrival date & time 05/08/15  0045 History  By signing my name below, I, Jorge [name], attest that this documentation has been prepared under the direction and in the presence of Shon Baton, MD. Electronically Signed: Budd [name], ED Scribe. 05/08/2015. 1:25 AM.      Chief Complaint  Patient presents with  . Abdominal Pain   The history is provided by the patient. No language interpreter was used.   HPI Comments: Jorge [name] is a 70 y.o. male former smoker with a PMHx of stroke, neuropathy, HTN, and HLD brought in by ambulance, who presents to the Emergency Department complaining of constant, worsening, burning, sub-sternal and epigstric abdominal pain onset 5 days ago. He also notes right-sided upper and lower abdominal pain, onset 3 days ago. He currently rates his pain as a 10/10. He reports associated nausea, vomiting (every 45-60 minutes today, onset 5 days ago), and diarrhea (loose stools). He states the vomit began as bilious material, but has since darkened to coffee-ground appearance. He reports his last normal BM was 2 days ago, with somewhat thin and greenish stool. Since then, he has had diarrhea and has only passed gas today. He notes exacerbation of the pain with lying supine and eating, and alleviation with standing or sitting upright. He states he is on Eliquis. He notes a PMHx of hiatal hernia with possible gastritis when he was younger. He does not drink alcohol.  Pt is allergic to coumadin.    Past Medical History  Diagnosis Date  . HTN (hypertension)   . Hyperlipidemia   . Edema   . Chest pain   . SOB (shortness of breath)   . Neuropathy (HCC)   . Stroke (HCC)   . CHF (congestive heart failure) [facility])    Past Surgical History  Procedure Laterality Date  . Cardiac catheterization    . Back surgery    . Carotid angiogram N/A 05/30/2014    Procedure: Dorise Bullion;  Surgeon: Nada Libman, MD;  Location: [facility] CATH LAB;  Service:  Cardiovascular;  Laterality: N/A;   History reviewed. No pertinent family history. Social History  Substance Use Topics  . Smoking status: Former Games developer  . Smokeless tobacco: Former Neurosurgeon    Quit date: 03/22/1996  . Alcohol Use: No    Review of Systems  Constitutional: Negative for fever.  Respiratory: Negative for shortness of breath.   Cardiovascular: Positive for chest pain.  Gastrointestinal: Positive for nausea, vomiting, abdominal pain and diarrhea.  All other systems reviewed and are negative.   Allergies  Coumadin  Home Medications   Prior to Admission medications   Medication Sig Start Date End Date Taking? Authorizing Provider  acetaminophen (TYLENOL) 500 MG tablet Take 1,000 mg by mouth every 6 (six) hours as needed for headache (pain).   Yes Historical Provider, MD  ALPRAZolam Prudy Feeler) 0.5 MG tablet Take 0.5 mg by mouth at bedtime as needed for sleep.    Yes Historical Provider, MD  apixaban (ELIQUIS) 5 MG TABS tablet Take 1 tablet (5 mg total) by mouth 2 (two) times daily. 05/31/14  Yes Lorenda Hatchet, MD  atorvastatin (LIPITOR) 40 MG tablet Take 1 tablet (40 mg total) by mouth daily at 6 PM. 05/31/14  Yes Lorenda Hatchet, MD  buPROPion [facility] SR) 150 MG 12 hr tablet Take 150 mg by mouth daily.   Yes Historical Provider, MD  gabapentin (NEURONTIN) 800 MG tablet Take 800 mg by mouth 2 (  two) times daily.   Yes Historical Provider, MD  metoprolol tartrate (LOPRESSOR) 25 MG tablet Take 0.5 tablets (12.5 mg total) by mouth 2 (two) times daily. 05/31/14  Yes Lorenda Hatchet, MD  sertraline (ZOLOFT) 100 MG tablet Take 100 mg by mouth daily.   Yes Historical Provider, MD   BP 120/60 mmHg  Pulse 69  Temp(Src) 98.8 F (37.1 C) (Oral)  Resp 14  SpO2 93% Physical Exam  Constitutional: He is oriented to person, place, and time.  Morbidly obese  HENT:  Head: Normocephalic and atraumatic.  Cardiovascular: Normal rate, regular rhythm and normal heart sounds.   No murmur  heard. Pulmonary/Chest: Effort normal and breath sounds normal. No respiratory distress. He has no wheezes.  Abdominal: Soft. Bowel sounds are normal. There is no tenderness. There is no rebound.  Protuberant abdomen, tenderness palpation mostly in the epigastrium and right upper quadrant, no rebound or guarding  Musculoskeletal: He exhibits no edema.  Neurological: He is alert and oriented to person, place, and time.  Skin: Skin is warm and dry.  Psychiatric: He has a normal mood and affect.  Nursing note and vitals reviewed.   ED Course  Procedures  DIAGNOSTIC STUDIES: Oxygen Saturation is 96% on RA, adequate by my interpretation.    COORDINATION OF CARE: 1:21 AM - Discussed plans to order diagnostic studies and imaging. Pt advised of plan for treatment and pt agrees.  Labs Review Labs Reviewed  CBC WITH DIFFERENTIAL/PLATELET - Abnormal; Notable for the following:    WBC 10.8 (*)    Neutro Abs 8.5 (*)    All other components within normal limits  COMPREHENSIVE METABOLIC PANEL - Abnormal; Notable for the following:    Potassium 3.4 (*)    Chloride 99 (*)    Glucose, Bld 119 (*)    ALT 15 (*)    All other components within normal limits  LIPASE, BLOOD  TROPONIN I  OCCULT BLOOD GASTRIC / DUODENUM (SPECIMEN CUP)    Imaging Review Ct Abdomen Pelvis W Contrast  05/08/2015  CLINICAL DATA:  Possible small bowel obstruction on x-ray. EXAM: CT ABDOMEN AND PELVIS WITH CONTRAST TECHNIQUE: Multidetector CT imaging of the abdomen and pelvis was performed using the standard protocol following bolus administration of intravenous contrast. CONTRAST:  OMNIPAQUE IOHEXOL 300 MG/ML  SOLN COMPARISON:  None. FINDINGS: Lower chest and abdominal wall:  Fatty right inguinal hernia. Right basilar atelectasis. Hepatobiliary: No focal liver abnormality.No evidence of biliary obstruction or stone. Pancreas: Unremarkable. Spleen: Sub cm low-density in the lower subcapsular spleen of doubtful clinical  significance. Adrenals/Urinary Tract: Negative adrenals. No hydronephrosis or stone. Unremarkable bladder. Reproductive:Enlarged central prostate projecting into the bladder base. Stomach/Bowel: Small bowel is gas and fluid dilated, affecting the proximal small bowel more than the ileum, but without focal transition point. Gas and stool present throughout the colon. 2 cm fatty structure posteriorly at the pyloric region appears submucosal, consistent with incidental lipoma rather than ingested lipid. Vascular/Lymphatic: No acute vascular abnormality. No mass or adenopathy. Peritoneal: No ascites or pneumoperitoneum. Musculoskeletal: Advanced and diffuse disc degeneration with L5-S1 discectomy and solid bony fusion. IMPRESSION: 1. Dilated small bowel and fluid levels but no transition point to suggest mechanical obstruction. Are there risk factors for ileus? 2. Right basilar atelectasis. 3. Incidental findings described above. Electronically Signed   By: Marnee Spring M.D.   On: 05/08/2015 04:03   Dg Abd Acute W/chest  05/08/2015  CLINICAL DATA:  Abdominal and epigastric pain since Thursday. EXAM: DG ABDOMEN  ACUTE W/ 1V CHEST COMPARISON:  Chest x-ray 05/27/2014 FINDINGS: Dilated small bowel with fluid levels, out of proportion to colonic distention. There is moderate amount of colonic gas, favoring a partial obstruction. No pneumoperitoneum identified. No concerning intra-abdominal mass effect or calcification. Chronic elevation the right diaphragm. There is no edema, consolidation, effusion, or pneumothorax. Normal heart size and stable mediastinal contours. IMPRESSION: Probable small bowel obstruction, with volume of colonic gas favoring partial obstruction. Electronically Signed   By: Marnee Spring M.D.   On: 05/08/2015 01:59   I have personally reviewed and evaluated these images and lab results as part of my medical decision-making.   EKG Interpretation   Date/Time:  Tuesday May 08 2015  00:51:27 EST Ventricular Rate:  78 PR Interval:  189 QRS Duration: 97 QT Interval:  401 QTC Calculation: 457 R Axis:   -3 Text Interpretation:  Sinus rhythm Probable left atrial enlargement Low  voltage, precordial leads LVH with secondary repolarization abnormality  Confirmed by HORTON  MD, COURTNEY ([id number]) on 05/08/2015 1:02:14 AM      MDM   Final diagnoses:  Ileus (HCC)    Patient presents with abdominal pain and vomiting. Reports coffee-ground emesis and is on Eliquis.  His chest pain and abdominal pain is most consistent with gastritis versus GERD versus peptic ulcer disease especially given reports coffee-ground emesis. Denies any red or dark stools. He is otherwise nontoxic. Tender on exam but no signs of peritonitis. Basic labwork obtained and patient given fluids and nausea and pain medication. He was also given IV protonix. Initial acute abdominal series with concerns for possible SBO. CT scan obtained.  Otherwise lab work is notable for mild leukocytosis. Hemoglobin is normal. Troponin is negative and EKG is reassuring. CT scan does not show a transition point but is more consistent with an ileus. On recheck, patient reports persistent pain and nausea. Will admit for symptom control and further management.  Discussed with Dr. Toniann Fail  I personally performed the services described in this documentation, which was scribed in my presence. The recorded information has been reviewed and is accurate.  Shon Baton, MD 05/08/15 [phone]

## 2015-05-08 NOTE — Care Management Note (Addendum)
Case Management Note  Patient Details  Name: Jorge [name] MRN: [id number] Date of Birth: 1945-12-10  Subjective/Objective:                 Admitted from home alone with abdominal pain. Independent with ADL's. DME: cane , walker . Pt states never uses walker, uses cane with ambulation.   Action/Plan: Per PT evaluation  : HHPT Return to home when medically stable.CM to f/u with d/c needs. Expected Discharge Date:                  Expected Discharge Plan:  Home/Self Care  In-House Referral:     Discharge planning Services  CM Consult  Post Acute Care Choice:    Choice offered to:     DME Arranged:    DME Agency:     HH Arranged:   RN,PT HH Agency:   [facility]  Status of Service:  In process, will continue to follow  Medicare Important Message Given:    Date Medicare IM Given:    Medicare IM give by:    Date Additional Medicare IM Given:    Additional Medicare Important Message give by:     If discussed at Long Length of Stay Meetings, dates discussed:    Additional Comments:  Per PT evaluation  : HHPT. CM made referral to [facility] , awaiting call back.  Kayde Warehime [facility]) [phone] [place], [place] [phone] 05/08/2015, 5:11 PM

## 2015-05-08 NOTE — Progress Notes (Signed)
Initial Nutrition Assessment  DOCUMENTATION CODES:   Morbid obesity  INTERVENTION:   -RD will follow for diet advancement and supplement as appropriate  NUTRITION DIAGNOSIS:   Inadequate oral intake related to altered GI function as evidenced by NPO status.  GOAL:   Patient will meet greater than or equal to 90% of their needs  MONITOR:   Diet advancement, Labs, Weight trends, Skin, I & O's  REASON FOR ASSESSMENT:   Malnutrition Screening Tool    ASSESSMENT:   Jorge [name] is a very pleasant 70 y.o. male with past medical hx afib, tia, cad, htn, chf, as, resents to the emergency department with the chief complaint of persistent worsening substernal and epigastric abdominal pain for 5 days. Initial evaluation includes abdominal CT revealing dilated bowel concerning for possible ileus versus obstruction.  Pt admitted with ileus vs SBO.   Hx obtained from pt at bedside. He reports poor appetite over the past 4 days, due to vomiting food whenever he attempted PO intake. Prior to this, pt reports fair to good appetite. He generally consumes 1-2 meals per day (Breakfast: cereal or eggs, Lunch/Dinner: meal from [employer] Whe[employer]). Pt reveals he has limited funds for food, due to being forced into early retirement.   Pt reports progressive intentional weight loss over the past several years, sharing he used to weigh over 400# ("they told me if I didn't lose weight, I would die"). He tries to be as active as he can despite impaired mobility as a result of stoke and back surgeries. He continues to be most of his housework and walk his dog daily. He notices that his clothes have been feeling looser.  Nutrition-Focused physical exam completed. Findings are no fat depletion, no muscle depletion, and mild edema.   Pt understands rationale for NPO order, but discussed potential for diet advancement per MD discretion. Also discussed importance of good PO intake for healing, as well as a  well-balanced diet to assist with continued weight loss efforts.   Labs reviewed: K: 3.4, Glucose: 126.   Diet Order:  Diet NPO time specified Except for: Sips with Meds  Skin:  Reviewed, no issues  Last BM:  05/04/15  Height:   Ht Readings from Last 1 Encounters:  05/08/15  (1.88 m)    Weight:   Wt Readings from Last 1 Encounters:  05/08/15 331 lb 3.2 oz (150.231 kg)    Ideal Body Weight:  86.4 kg  BMI:  Body mass index is 42.51 kg/(m^2).  Estimated Nutritional Needs:   Kcal:  1900-2100  Protein:  100-110 grams  Fluid:  1.9-2.1 L  EDUCATION NEEDS:   No education needs identified at this time  Roisin Mones A. Mayford Knife, RD, LDN, CDE Pager: [phone] After hours Pager: [phone]

## 2015-05-08 NOTE — Progress Notes (Signed)
Utilization review completed. Willamae Demby, RN, BSN. 

## 2015-05-08 NOTE — ED Notes (Signed)
Pt from home by [facility] complains of abdominal pain that started Thursday and has gradually got worse it starts mid sternum and goes to the right quadrant. Pt complains of N and some vomiting.

## 2015-05-09 DIAGNOSIS — R1013 Epigastric pain: Secondary | ICD-10-CM

## 2015-05-09 DIAGNOSIS — R112 Nausea with vomiting, unspecified: Secondary | ICD-10-CM

## 2015-05-09 DIAGNOSIS — I10 Essential (primary) hypertension: Secondary | ICD-10-CM

## 2015-05-09 DIAGNOSIS — K567 Ileus, unspecified: Principal | ICD-10-CM

## 2015-05-09 DIAGNOSIS — I480 Paroxysmal atrial fibrillation: Secondary | ICD-10-CM

## 2015-05-09 LAB — BASIC METABOLIC PANEL
Anion gap: 11 (ref 5–15)
BUN: 11 mg/dL (ref 6–20)
CALCIUM: 8.2 mg/dL — AB (ref 8.9–10.3)
CO2: 26 mmol/L (ref 22–32)
CREATININE: 1.07 mg/dL (ref 0.61–1.24)
Chloride: 103 mmol/L (ref 101–111)
GFR calc Af Amer: >60 mL/min (ref >60)
GFR calc non Af Amer: >60 mL/min (ref >60)
GLUCOSE: 77 mg/dL (ref 65–99)
Potassium: 3.1 mmol/L — ABNORMAL LOW (ref 3.5–5.1)
Sodium: 140 mmol/L (ref 135–145)

## 2015-05-09 LAB — CBC
HCT: 40.3 % (ref 39.0–52.0)
HEMOGLOBIN: 13.4 g/dL (ref 13.0–17.0)
MCH: 30.7 pg (ref 26.0–34.0)
MCHC: 33.3 g/dL (ref 30.0–36.0)
MCV: 92.4 fL (ref 78.0–100.0)
Platelets: 225 10*3/uL (ref 150–400)
RBC: 4.36 MIL/uL (ref 4.22–5.81)
RDW: 13.8 % (ref 11.5–15.5)
WBC: 8.3 10*3/uL (ref 4.0–10.5)

## 2015-05-09 LAB — C DIFFICILE QUICK SCREEN W PCR REFLEX
C DIFFICILE (CDIFF) TOXIN: NEGATIVE
C DIFFICLE (CDIFF) ANTIGEN: NEGATIVE
C Diff interpretation: NEGATIVE

## 2015-05-09 MED ORDER — CHLORHEXIDINE GLUCONATE 0.12 % MT SOLN
15.0000 mL | Freq: Two times a day (BID) | OROMUCOSAL | Status: DC
  Administered 2015-05-09 – 2015-05-10 (×4): 15 mL via OROMUCOSAL
  Filled 2015-05-09 (×4): qty 15

## 2015-05-09 MED ORDER — CETYLPYRIDINIUM CHLORIDE 0.05 % MT LIQD
7.0000 mL | Freq: Two times a day (BID) | OROMUCOSAL | Status: DC
  Administered 2015-05-09 – 2015-05-10 (×2): 7 mL via OROMUCOSAL

## 2015-05-09 MED ORDER — METOPROLOL TARTRATE 12.5 MG HALF TABLET
12.5000 mg | ORAL_TABLET | Freq: Two times a day (BID) | ORAL | Status: DC
  Administered 2015-05-09 – 2015-05-10 (×2): 12.5 mg via ORAL
  Filled 2015-05-09 (×2): qty 1

## 2015-05-09 MED ORDER — SODIUM CHLORIDE 0.9 % IV SOLN
INTRAVENOUS | Status: DC
  Administered 2015-05-09 – 2015-05-10 (×3): via INTRAVENOUS

## 2015-05-09 NOTE — Evaluation (Signed)
Physical Therapy Evaluation Patient Details Name: Jorge [name] MRN: [id number] DOB: 10-14-1945 Today's Date: 05/09/2015   History of Present Illness  70 yo male from ALF with ileus has been admitted to hosp, noted PMHx:  a-fib, HLD, HTN, CHF, CP, morbid obesity, TIA  Clinical Impression  Pt was seen for assessment of his mobility after admission from ALF with son nearby.  Pt is a candidate to return home with HHPT unless he is not progressing in therapy.  Have advised him to switch to RW for gait since his [facility] is not sufficient help and he is still a higher fall risk.    Follow Up Recommendations Home health PT;Supervision - Intermittent    Equipment Recommendations  Rolling walker with 5" wheels    Recommendations for Other Services       Precautions / Restrictions Precautions Precautions: Fall Restrictions Weight Bearing Restrictions: No      Mobility  Bed Mobility               General bed mobility comments: up when PT entered  Transfers Overall transfer level: Needs assistance Equipment used: Rolling walker (2 wheeled);1 person hand held assist Transfers: Sit to/from [employer] Sit to Stand: Min assist Stand pivot transfers: Min guard       General transfer comment: reminders for hand placement and control of descent to sit  Ambulation/Gait Ambulation/Gait assistance: Min assist;Min guard Ambulation Distance (Feet): 35 Feet Assistive device: Rolling walker (2 wheeled);1 person hand held assist Gait Pattern/deviations: Decreased stride length;Wide base of support;Drifts right/left Gait velocity: reduced Gait velocity interpretation: Below normal speed for age/gender General Gait Details: avoided having him up for long given his general complaint of feeling weak  Stairs            Wheelchair Mobility    Modified Rankin (Stroke Patients Only)       Balance Overall balance assessment: Needs assistance Sitting-balance  support: Feet supported Sitting balance-Leahy Scale: Good     Standing balance support: Bilateral upper extremity supported Standing balance-Leahy Scale: Fair                               Pertinent Vitals/Pain Pain Assessment: No/denies pain    Home Living Family/patient expects to be discharged to:: Private residence Living Arrangements: Alone Available Help at Discharge: Family;Available PRN/intermittently Type of Home: Independent living facility Home Access: Level entry     Home Layout: One level Home Equipment: Cane - single point;Walker - 4 wheels;Grab bars - tub/shower;Adaptive equipment      Prior Function Level of Independence: Independent with assistive device(s)               Hand Dominance        Extremity/Trunk Assessment   Upper Extremity Assessment: Overall WFL for tasks assessed           Lower Extremity Assessment: RLE deficits/detail RLE Deficits / Details: 4- to 4 strength with tendency to give out on MM testing    Cervical / Trunk Assessment: Normal  Communication   Communication: No difficulties  Cognition Arousal/Alertness: Awake/alert Behavior During Therapy: WFL for tasks assessed/performed Overall Cognitive Status: Within Functional Limits for tasks assessed                      General Comments General comments (skin integrity, edema, etc.): Pt is accustomed to being home with limited help but is in facility with  help of call cords and son nearby.  Discussed lifeline call cord with him, and pt reports he keeps his phone with him    Exercises        Assessment/Plan    PT Assessment Patient needs continued PT services  PT Diagnosis Difficulty walking;Hemiplegia dominant side;Other (comment) (R side residual weakness)   PT Problem List Decreased strength;Decreased range of motion;Decreased activity tolerance;Decreased balance;Decreased mobility;Decreased coordination;Decreased safety  awareness;Cardiopulmonary status limiting activity;Obesity;Decreased skin integrity  PT Treatment Interventions Gait training;DME instruction;Functional mobility training;Therapeutic activities;Therapeutic exercise;Balance training;Neuromuscular re-education;Patient/family education   PT Goals (Current goals can be found in the Care Plan section) Acute Rehab PT Goals Patient Stated Goal: to get back home safely PT Goal Formulation: With patient Time For Goal Achievement: 05/23/15 Potential to Achieve Goals: Good    Frequency Min 2X/week   Barriers to discharge Decreased caregiver support ([place] retirement apts)      Co-evaluation               End of Session Equipment Utilized During Treatment: Gait belt Activity Tolerance: Patient tolerated treatment well;Patient limited by fatigue Patient left: in chair;with call bell/phone within reach Nurse Communication: Mobility status         Time: 1610-9604 PT Time Calculation (min) (ACUTE ONLY): 30 min   Charges:   PT Evaluation $PT Eval Moderate Complexity: 1 Procedure PT Treatments $Gait Training: 8-22 mins   PT G Codes:        Ivar Drape 05-10-15, 2:21 PM   Samul Dada, PT MS Acute Rehab Dept. Number: [phone] [id number] and [facility] [phone]

## 2015-05-09 NOTE — Progress Notes (Addendum)
Dr. Vanessa Barbara paged to clarify order for IV fluid order that expired. Orders placed

## 2015-05-09 NOTE — Evaluation (Signed)
Occupational Therapy Evaluation Patient Details Name: Jorge [name] MRN: [id number] DOB: 1946/02/08 Today's Date: 05/09/2015    History of Present Illness 70 yo male from ILF with ileus has been admitted to hosp, noted PMHx:  a-fib, HLD, HTN, CHF, CP, morbid obesity, TIA   Clinical Impression   Pt was independent with assistive devices prior to admission.  He presents with generalized weakness, impaired balance and decreased activity tolerance interfering with ability to perform ADL and mobility at his baseline.  Will follow acutely.  Do not anticipate pt will require follow up OT.    Follow Up Recommendations  No OT follow up    Equipment Recommendations  None recommended by OT (pt declined a shower seat/3 in 1)    Recommendations for Other Services       Precautions / Restrictions Precautions Precautions: Fall Restrictions Weight Bearing Restrictions: No      Mobility Bed Mobility Overal bed mobility: Modified Independent             General bed mobility comments: HOB up  Transfers Overall transfer level: Needs assistance Equipment used: Rolling walker (2 wheeled);1 person hand held assist Transfers: Sit to/from [employer] Sit to Stand: Supervision Stand pivot transfers: Supervision       General transfer comment: bed to chair    Balance Overall balance assessment: Needs assistance Sitting-balance support: Feet supported Sitting balance-Leahy Scale: Good     Standing balance support: Bilateral upper extremity supported Standing balance-Leahy Scale: Fair                              ADL Overall ADL's : Needs assistance/impaired Eating/Feeding: Independent;Sitting   Grooming: Supervision/safety;Standing   Upper Body Bathing: Set up;Sitting   Lower Body Bathing: Supervison/ safety;Sit to/from stand   Upper Body Dressing : Supervision/safety;Sitting   Lower Body Dressing: Supervision/safety;Sit to/from stand Lower  Body Dressing Details (indicate cue type and reason): pulls leg up onto bed to don and doff socks Toilet Transfer: Supervision/safety;RW;Ambulation   Toileting- Clothing Manipulation and Hygiene: Supervision/safety;Sit to/from stand       Functional mobility during ADLs: Supervision/safety;Rolling walker       Vision     Perception     Praxis      Pertinent Vitals/Pain Pain Assessment: No/denies pain     Hand Dominance Left   Extremity/Trunk Assessment Upper Extremity Assessment Upper Extremity Assessment: Overall WFL for tasks assessed   Lower Extremity Assessment Lower Extremity Assessment: Defer to PT evaluation RLE Deficits / Details: 4- to 4 strength with tendency to give out on MM testing   Cervical / Trunk Assessment Cervical / Trunk Assessment: Normal (hx of back sx)   Communication Communication Communication: No difficulties   Cognition Arousal/Alertness: Awake/alert Behavior During Therapy: WFL for tasks assessed/performed Overall Cognitive Status: Within Functional Limits for tasks assessed                     General Comments       Exercises      Shoulder Instructions      Home Living Family/patient expects to be discharged to:: Private residence Living Arrangements: Alone Available Help at Discharge: Family;Available PRN/intermittently Type of Home: Independent living facility Home Access: Level entry (threshold only)     Home Layout: One level     Bathroom Shower/Tub: Chief Strategy Officer: Standard Bathroom Accessibility: Yes   Home Equipment: Cane - single point;Walker - 4  wheels;Grab bars - tub/shower Adaptive Equipment: Long-handled shoe horn        Prior Functioning/Environment Level of Independence: Independent with assistive device(s)        Comments: uses cane outside, drives, gets own groceries    OT Diagnosis: Generalized weakness   OT Problem List: Decreased strength;Decreased activity  tolerance;Impaired balance (sitting and/or standing)   OT Treatment/Interventions: Self-care/ADL training;DME and/or AE instruction;Patient/family education    OT Goals(Current goals can be found in the care plan section) Acute Rehab OT Goals Patient Stated Goal: to get back home safely OT Goal Formulation: With patient Time For Goal Achievement: 05/16/15 Potential to Achieve Goals: Good ADL Goals Pt Will Perform Grooming: with modified independence;standing Pt Will Perform Lower Body Bathing: with modified independence (standing) Pt Will Perform Lower Body Dressing: with modified independence;sit to/from stand Pt Will Transfer to Toilet: with modified independence;ambulating;grab bars Pt Will Perform Toileting - Clothing Manipulation and hygiene: with modified independence;sit to/from stand Pt Will Perform Tub/Shower Transfer: Shower transfer;grab bars;ambulating Additional ADL Goal #1: Pt will generalize energy conservation strategies in ADL and mobility.  OT Frequency: Min 2X/week   Barriers to D/C:            Co-evaluation              End of Session    Activity Tolerance: Patient tolerated treatment well Patient left: in chair;with call bell/phone within reach   Time: 1429-1450 OT Time Calculation (min): 21 min Charges:  OT General Charges $OT Visit: 1 Procedure OT Evaluation $OT Eval Low Complexity: 1 Procedure G-Codes:    Evern Bio 05/09/2015, 3:07 PM  [phone]

## 2015-05-09 NOTE — Progress Notes (Signed)
[facility] PROGRESS NOTE  Jorge [name] WGN:[id number] DOB: 03-12-1946 DOA: 05/08/2015 PCP: Ailene Ravel, MD  Assessment/Plan: 1. Probable ileus -Jorge [name] presenting with complaints of abdominal pain associate with nausea and vomiting as CT scan revealed dilated small bowel and fluid levels -Initially was made nothing by mouth, started on IV fluids, conservative management. -By 05/09/2015 patient reporting significant improvement, having bowel movements. -Continue to encourage ambulation -Advancing diet to clears today.  2.   Nausea/vomiting  -Likely secondary to ileus  -Symptoms significantly improved by 05/09/2015 as his diet is being advanced to clears.   3.  History of paroxysmal atrial fibrillation -CHADSVasc score of 5 -Ventricular rates in the 60s to 70s  -Continue anticoagulation with Eliquis -Plan to restart metoprolol at 12.5 mg by mouth twice a day   4.   History of hypertension. -Antihypertensive agents held on admission due to concern for hypotension. -Blood pressures improved, plan to restart metoprolol 12.5 mg by mouth twice a day  Code Status: Full Code Family Communication:  Disposition Plan: Anticipate discharge in the next 24-48 hours    HPI/Subjective: Jorge [name] is a pleasant 70 year old with a past medical history of atrial fibrillation, transient ischemic attack, coronary disease, admitted to medicine service on 05/08/2015 when he presented with complaints of abdominal and associate with nausea and vomiting. Initial workup included a CT scan of abdomen and pelvis that showed dilated small bowel and fluid levels which could be consistent with ileus. He was made nothing by mouth, started on IV fluids and managed conservatively.  by the following day patient had a bowel movement, reported feeling better for which his diet was advanced to clears.   Objective: Filed Vitals:   05/09/15 0634 05/09/15 1506  BP: 126/54 136/62  Pulse: 70 83  Temp:  97.6 F (36.4 C)   Resp: 20 18    Intake/Output Summary (Last 24 hours) at 05/09/15 1706 Last data filed at 05/09/15 1453  Gross per 24 hour  Intake   1310 ml  Output   1000 ml  Net    310 ml   Filed Weights   05/08/15 0539 05/09/15 0500  Weight: 150.231 kg (331 lb 3.2 oz) 149.233 kg (329 lb)    Exam:   General:   nontoxic appearing, no acute distress   Cardiovascular: regular rate rhythm normal S1-S2 no murmurs rubs or gallops   Respiratory: normal respiratory effort, lungs are clear to auscultation bilaterally  Abdomen: Soft nontender nondistended  Musculoskeletal: No edema   Data Reviewed: Basic Metabolic Panel:  Recent Labs Lab 05/08/15 0218 05/08/15 0907 05/09/15 0657  NA 140  --  140  K 3.4*  --  3.1*  CL 99*  --  103  CO2 27  --  26  GLUCOSE 119*  --  77  BUN 14  --  11  CREATININE 1.16  --  1.07  CALCIUM 8.9  --  8.2*  MG  --  2.3  --    Liver Function Tests:  Recent Labs Lab 05/08/15 0218  AST 17  ALT 15*  ALKPHOS 76  BILITOT 0.6  PROT 6.6  ALBUMIN 3.5    Recent Labs Lab 05/08/15 0218  LIPASE 39   No results for input(s): AMMONIA in the last 168 hours. CBC:  Recent Labs Lab 05/08/15 0218 05/08/15 1338 05/09/15 0657  WBC 10.8*  --  8.3  NEUTROABS 8.5*  --   --   HGB 13.8 13.2 13.4  HCT 41.4 40.3 40.3  MCV  91.0  --  92.4  PLT 275  --  225   Cardiac Enzymes:  Recent Labs Lab 05/08/15 0218 05/08/15 0907  TROPONINI <0.03 <0.03   BNP (last 3 results) No results for input(s): BNP in the last 8760 hours.  ProBNP (last 3 results) No results for input(s): PROBNP in the last 8760 hours.  CBG: No results for input(s): GLUCAP in the last 168 hours.  Recent Results (from the past 240 hour(s))  C difficile quick scan w PCR reflex     Status: None   Collection Time: 05/09/15 10:42 AM  Result Value Ref Range Status   C Diff antigen NEGATIVE NEGATIVE Final   C Diff toxin NEGATIVE NEGATIVE Final   C Diff interpretation  Negative for toxigenic C. difficile  Final     Studies: Ct Abdomen Pelvis W Contrast  05/08/2015  CLINICAL DATA:  Possible small bowel obstruction on x-ray. EXAM: CT ABDOMEN AND PELVIS WITH CONTRAST TECHNIQUE: Multidetector CT imaging of the abdomen and pelvis was performed using the standard protocol following bolus administration of intravenous contrast. CONTRAST:  OMNIPAQUE IOHEXOL 300 MG/ML  SOLN COMPARISON:  None. FINDINGS: Lower chest and abdominal wall:  Fatty right inguinal hernia. Right basilar atelectasis. Hepatobiliary: No focal liver abnormality.No evidence of biliary obstruction or stone. Pancreas: Unremarkable. Spleen: Sub cm low-density in the lower subcapsular spleen of doubtful clinical significance. Adrenals/Urinary Tract: Negative adrenals. No hydronephrosis or stone. Unremarkable bladder. Reproductive:Enlarged central prostate projecting into the bladder base. Stomach/Bowel: Small bowel is gas and fluid dilated, affecting the proximal small bowel more than the ileum, but without focal transition point. Gas and stool present throughout the colon. 2 cm fatty structure posteriorly at the pyloric region appears submucosal, consistent with incidental lipoma rather than ingested lipid. Vascular/Lymphatic: No acute vascular abnormality. No mass or adenopathy. Peritoneal: No ascites or pneumoperitoneum. Musculoskeletal: Advanced and diffuse disc degeneration with L5-S1 discectomy and solid bony fusion. IMPRESSION: 1. Dilated small bowel and fluid levels but no transition point to suggest mechanical obstruction. Are there risk factors for ileus? 2. Right basilar atelectasis. 3. Incidental findings described above. Electronically Signed   By: Marnee Spring M.D.   On: 05/08/2015 04:03   Dg Abd Acute W/chest  05/08/2015  CLINICAL DATA:  Abdominal and epigastric pain since Thursday. EXAM: DG ABDOMEN ACUTE W/ 1V CHEST COMPARISON:  Chest x-ray 05/27/2014 FINDINGS: Dilated small bowel with fluid  levels, out of proportion to colonic distention. There is moderate amount of colonic gas, favoring a partial obstruction. No pneumoperitoneum identified. No concerning intra-abdominal mass effect or calcification. Chronic elevation the right diaphragm. There is no edema, consolidation, effusion, or pneumothorax. Normal heart size and stable mediastinal contours. IMPRESSION: Probable small bowel obstruction, with volume of colonic gas favoring partial obstruction. Electronically Signed   By: Marnee Spring M.D.   On: 05/08/2015 01:59    Scheduled Meds: . antiseptic oral rinse  7 mL Mouth Rinse q12n4p  . apixaban  5 mg Oral BID  . chlorhexidine  15 mL Mouth Rinse BID  . gabapentin  800 mg Oral BID   Continuous Infusions: . sodium chloride 75 mL/hr at 05/09/15 0849    Principal Problem:   Ileus (HCC) Active Problems:   CAD (coronary artery disease)   Hemispheric carotid artery syndrome   Paroxysmal atrial fibrillation (HCC)   HTN (hypertension)   Hyperlipidemia   CHF (congestive heart failure) (HCC)   Abdominal pain   Nausea and vomiting    Time spent: 35 min  Jeralyn Bennett  [facility] Pager [phone]. If 7PM-7AM, please contact night-coverage at [email or web address], password [facility] 05/09/2015, 5:06 PM  LOS: 1 day

## 2015-05-09 NOTE — Progress Notes (Signed)
MD made aware the CDIFF resulted negative. Order received to d/c enteric precautions.

## 2015-05-10 MED ORDER — ACETAMINOPHEN 325 MG PO TABS: 650.0000 mg | ORAL_TABLET | Freq: Four times a day (QID) | ORAL | Status: DC | PRN

## 2015-05-10 MED ORDER — BOOST / RESOURCE BREEZE PO LIQD
1.0000 | Freq: Two times a day (BID) | ORAL | Status: DC
  Administered 2015-05-10: 1 via ORAL

## 2015-05-10 NOTE — Discharge Summary (Signed)
Physician Discharge Summary  YAW ESCOTO ZOX:[id number] DOB: [birth year] DOA: 05/08/2015  PCP: Ailene Ravel, MD  Admit date: 05/08/2015 Discharge date: 05/10/2015  Time spent: 35 minutes  Recommendations for Outpatient Follow-up:  1. Mr Winkels was admitted for Ileus that improved with conservative management. He was tolerating PO intake and having regular bowel movements by the day of discharge. Please follow up.   Discharge Diagnoses:  Principal Problem:   Ileus (HCC) Active Problems:   CAD (coronary artery disease)   Hemispheric carotid artery syndrome   Paroxysmal atrial fibrillation (HCC)   HTN (hypertension)   Hyperlipidemia   CHF (congestive heart failure) (HCC)   Abdominal pain   Nausea and vomiting  Discharge Condition: Stable  Diet recommendation: Heart Healthy  Filed Weights   05/08/15 0539 05/09/15 0500 05/10/15 0900  Weight: 150.231 kg (331 lb 3.2 oz) 149.233 kg (329 lb) 148.644 kg (327 lb 11.2 oz)    History of present illness:  DERAY DAWES is a very pleasant 70 y.o. male with past medical hx afib, tia, cad, htn, chf, as, resents to the emergency department with the chief complaint of persistent worsening substernal and epigastric abdominal pain for 5 days. Initial evaluation includes abdominal CT revealing dilated bowel concerning for possible ileus versus obstruction.  Information obtained from patient reports 5 days ago he developed nausea with vomiting. He reports several episodes of watery bile colored emesis initially and yesterday one episode of emesis that looked somewhat coffee ground. 3 days ago he developed abdominal distention with intermittent pain. He reports the pain is sharp diffuse but concentrated in the right lower quadrant. He reports passing gas and having 2 episodes of loose stool prior to coming to the hospital. He denies any chest pain palpitations headache dizziness fever chills. He denies any dysuria hematuria but does complain of  frequency and urgency.  In the emergency department he is afebrile and hemodynamically stable loss of blood pressure somewhat soft. He is provided with IV fluids and Zofran and morphine.  Hospital Course:  Mr Mayhall is a pleasant 70 year old with a past medical history of atrial fibrillation, transient ischemic attack, coronary disease, admitted to medicine service on 05/08/2015 when he presented with complaints of abdominal and associate with nausea and vomiting. Initial workup included a CT scan of abdomen and pelvis that showed dilated small bowel and fluid levels which could be consistent with ileus. He was made nothing by mouth, started on IV fluids and managed conservatively. by the following day patient had a bowel movement, reported feeling better for which his diet was advanced to clears. He started having regular bowel movements. On physical exam had active bowel sounds. His diet was gradually advanced which he tolerated well. He was see by physical therapy during this hospitalization who recommended home health PT. CM was consulted to set Mr Vallee up with home health services. He was discharged back to his independent living facility in stable condition on 05/10/2015.     Consultations:  PT  CM  Discharge Exam: Filed Vitals:   05/10/15 0458 05/10/15 0948  BP: 134/50 112/79  Pulse: 61 78  Temp: 98 F (36.7 C)   Resp: 20      General: nontoxic appearing, no acute distress, tolerating PO intake.   Cardiovascular: regular rate rhythm normal S1-S2 no murmurs rubs or gallops   Respiratory: normal respiratory effort, lungs are clear to auscultation bilaterally  Abdomen: Soft nontender nondistended  Musculoskeletal: No edema  Discharge Instructions   Discharge Instructions  Call MD for:  difficulty breathing, headache or visual disturbances    Complete by:  As directed      Call MD for:  difficulty breathing, headache or visual disturbances    Complete by:  As  directed      Call MD for:  difficulty breathing, headache or visual disturbances    Complete by:  As directed      Call MD for:  extreme fatigue    Complete by:  As directed      Call MD for:  extreme fatigue    Complete by:  As directed      Call MD for:  extreme fatigue    Complete by:  As directed      Call MD for:  hives    Complete by:  As directed      Call MD for:  hives    Complete by:  As directed      Call MD for:  hives    Complete by:  As directed      Call MD for:  persistant dizziness or light-headedness    Complete by:  As directed      Call MD for:  persistant dizziness or light-headedness    Complete by:  As directed      Call MD for:  persistant dizziness or light-headedness    Complete by:  As directed      Call MD for:  persistant nausea and vomiting    Complete by:  As directed      Call MD for:  persistant nausea and vomiting    Complete by:  As directed      Call MD for:  persistant nausea and vomiting    Complete by:  As directed      Call MD for:  redness, tenderness, or signs of infection (pain, swelling, redness, odor or green/yellow discharge around incision site)    Complete by:  As directed      Call MD for:  redness, tenderness, or signs of infection (pain, swelling, redness, odor or green/yellow discharge around incision site)    Complete by:  As directed      Call MD for:  redness, tenderness, or signs of infection (pain, swelling, redness, odor or green/yellow discharge around incision site)    Complete by:  As directed      Call MD for:  severe uncontrolled pain    Complete by:  As directed      Call MD for:  severe uncontrolled pain    Complete by:  As directed      Call MD for:  severe uncontrolled pain    Complete by:  As directed      Call MD for:  temperature >100.4    Complete by:  As directed      Call MD for:  temperature >100.4    Complete by:  As directed      Call MD for:  temperature >100.4    Complete by:  As directed       Call MD for:    Complete by:  As directed      Call MD for:    Complete by:  As directed      Call MD for:    Complete by:  As directed      Diet - low sodium heart healthy    Complete by:  As directed      Diet - low sodium heart healthy  Complete by:  As directed      Diet - low sodium heart healthy    Complete by:  As directed      Increase activity slowly    Complete by:  As directed      Increase activity slowly    Complete by:  As directed      Increase activity slowly    Complete by:  As directed           Current Discharge Medication List    CONTINUE these medications which have CHANGED   Details  acetaminophen (TYLENOL) 325 MG tablet Take 2 tablets (650 mg total) by mouth every 6 (six) hours as needed for mild pain (or Fever >/= 101). Qty: 30 tablet, Refills: 0      CONTINUE these medications which have NOT CHANGED   Details  ALPRAZolam (XANAX) 0.5 MG tablet Take 0.5 mg by mouth at bedtime as needed for sleep.     apixaban (ELIQUIS) 5 MG TABS tablet Take 1 tablet (5 mg total) by mouth 2 (two) times daily. Qty: 60 tablet, Refills: 0    atorvastatin (LIPITOR) 40 MG tablet Take 1 tablet (40 mg total) by mouth daily at 6 PM. Qty: 30 tablet, Refills: 0    buPROPion (WELLBUTRIN SR) 150 MG 12 hr tablet Take 150 mg by mouth daily.    gabapentin (NEURONTIN) 800 MG tablet Take 800 mg by mouth 2 (two) times daily.    metoprolol tartrate (LOPRESSOR) 25 MG tablet Take 0.5 tablets (12.5 mg total) by mouth 2 (two) times daily. Qty: 30 tablet, Refills: 0    sertraline (ZOLOFT) 100 MG tablet Take 100 mg by mouth daily.       Allergies  Allergen Reactions  . Coumadin [Warfarin Sodium] Rash   Follow-up Information    Follow up with [facility].   Contact information:   [address] [place] [address] [phone]       Follow up with [facility], MD In 1 week.   Specialty:  Family Medicine   Contact information:   [address] [place] [address] [phone]        The results of significant diagnostics from this hospitalization (including imaging, microbiology, ancillary and laboratory) are listed below for reference.    Significant Diagnostic Studies: Ct Abdomen Pelvis W Contrast  05/08/2015  CLINICAL DATA:  Possible small bowel obstruction on x-ray. EXAM: CT ABDOMEN AND PELVIS WITH CONTRAST TECHNIQUE: Multidetector CT imaging of the abdomen and pelvis was performed using the standard protocol following bolus administration of intravenous contrast. CONTRAST:  OMNIPAQUE IOHEXOL 300 MG/ML  SOLN COMPARISON:  None. FINDINGS: Lower chest and abdominal wall:  Fatty right inguinal hernia. Right basilar atelectasis. Hepatobiliary: No focal liver abnormality.No evidence of biliary obstruction or stone. Pancreas: Unremarkable. Spleen: Sub cm low-density in the lower subcapsular spleen of doubtful clinical significance. Adrenals/Urinary Tract: Negative adrenals. No hydronephrosis or stone. Unremarkable bladder. Reproductive:Enlarged central prostate projecting into the bladder base. Stomach/Bowel: Small bowel is gas and fluid dilated, affecting the proximal small bowel more than the ileum, but without focal transition point. Gas and stool present throughout the colon. 2 cm fatty structure posteriorly at the pyloric region appears submucosal, consistent with incidental lipoma rather than ingested lipid. Vascular/Lymphatic: No acute vascular abnormality. No mass or adenopathy. Peritoneal: No ascites or pneumoperitoneum. Musculoskeletal: Advanced and diffuse disc degeneration with L5-S1 discectomy and solid bony fusion. IMPRESSION: 1. Dilated small bowel and fluid levels but no  transition point to suggest mechanical obstruction. Are there risk factors for ileus? 2. Right basilar atelectasis. 3. Incidental findings described above. Electronically Signed   By: Marnee Spring M.D.   On: 05/08/2015 04:03   Dg Abd  Acute W/chest  05/08/2015  CLINICAL DATA:  Abdominal and epigastric pain since Thursday. EXAM: DG ABDOMEN ACUTE W/ 1V CHEST COMPARISON:  Chest x-ray 05/27/2014 FINDINGS: Dilated small bowel with fluid levels, out of proportion to colonic distention. There is moderate amount of colonic gas, favoring a partial obstruction. No pneumoperitoneum identified. No concerning intra-abdominal mass effect or calcification. Chronic elevation the right diaphragm. There is no edema, consolidation, effusion, or pneumothorax. Normal heart size and stable mediastinal contours. IMPRESSION: Probable small bowel obstruction, with volume of colonic gas favoring partial obstruction. Electronically Signed   By: Marnee Spring M.D.   On: 05/08/2015 01:59    Microbiology: Recent Results (from the past 240 hour(s))  C difficile quick scan w PCR reflex     Status: None   Collection Time: 05/09/15 10:42 AM  Result Value Ref Range Status   C Diff antigen NEGATIVE NEGATIVE Final   C Diff toxin NEGATIVE NEGATIVE Final   C Diff interpretation Negative for toxigenic C. difficile  Final     Labs: Basic Metabolic Panel:  Recent Labs Lab 05/08/15 0218 05/08/15 0907 05/09/15 0657  NA 140  --  140  K 3.4*  --  3.1*  CL 99*  --  103  CO2 27  --  26  GLUCOSE 119*  --  77  BUN 14  --  11  CREATININE 1.16  --  1.07  CALCIUM 8.9  --  8.2*  MG  --  2.3  --    Liver Function Tests:  Recent Labs Lab 05/08/15 0218  AST 17  ALT 15*  ALKPHOS 76  BILITOT 0.6  PROT 6.6  ALBUMIN 3.5    Recent Labs Lab 05/08/15 0218  LIPASE 39   No results for input(s): AMMONIA in the last 168 hours. CBC:  Recent Labs Lab 05/08/15 0218 05/08/15 1338 05/09/15 0657  WBC 10.8*  --  8.3  NEUTROABS 8.5*  --   --   HGB 13.8 13.2 13.4  HCT 41.4 40.3 40.3  MCV 91.0  --  92.4  PLT 275  --  225   Cardiac Enzymes:  Recent Labs Lab 05/08/15 0218 05/08/15 0907  TROPONINI <0.03 <0.03   BNP: BNP (last 3 results) No results  for input(s): BNP in the last 8760 hours.  ProBNP (last 3 results) No results for input(s): PROBNP in the last 8760 hours.  CBG: No results for input(s): GLUCAP in the last 168 hours.     Signed:  Jeralyn Bennett MD.  [facility] 05/10/2015, 3:12 PM

## 2015-05-10 NOTE — Progress Notes (Addendum)
CM made referral to [facility] Corrie Dandy Kathrynn Running @  [phone] for HHRN,PT,SW. Pt made aware . Pt voiced financial concerns,states needs help with paying for medications. Pt with insurance, CM explained and provided pt with information  on SHIP'S program. CM called and shared information with son, Trinna Post and he stated he would help dad apply for assistance through the [facility] program. Both pt and son appreciative regarding information given. Gae Gallop RN,BSN,CM [phone]

## 2015-05-10 NOTE — Progress Notes (Signed)
Nutrition Follow-up  DOCUMENTATION CODES:   Morbid obesity  INTERVENTION:   -Boost Breeze po BID, each supplement provides 250 kcal and 9 grams of protein  NUTRITION DIAGNOSIS:   Inadequate oral intake related to altered GI function as evidenced by  (NPO/clear liquid status).  Ongoing  GOAL:   Patient will meet greater than or equal to 90% of their needs  Progressing  MONITOR:   PO intake, Supplement acceptance, Diet advancement, Labs, Weight trends, Skin, I & O's  REASON FOR ASSESSMENT:   Malnutrition Screening Tool    ASSESSMENT:   Jorge [name] is a very pleasant 70 y.o. male with past medical hx afib, tia, cad, htn, chf, as, resents to the emergency department with the chief complaint of persistent worsening substernal and epigastric abdominal pain for 5 days. Initial evaluation includes abdominal CT revealing dilated bowel concerning for possible ileus versus obstruction.  Pt sleeping soundly and snoring audibly at time of visit; RD did not wake.   Per MD notes, significant improvement with abdominal pain, nausea, and vomiting. He was advanced to a clear liquid diet on 05/09/15. RD will add supplements to due restrictions of clear liquid diet.   Labs reviewed: K: 3.1.   Diet Order:  Diet clear liquid Room service appropriate?: Yes; Fluid consistency:: Thin  Skin:  Reviewed, no issues  Last BM:  05/10/15  Height:   Ht Readings from Last 1 Encounters:  05/08/15  (1.88 m)    Weight:   Wt Readings from Last 1 Encounters:  05/10/15 327 lb 11.2 oz (148.644 kg)    Ideal Body Weight:  86.4 kg  BMI:  Body mass index is 42.06 kg/(m^2).  Estimated Nutritional Needs:   Kcal:  1900-2100  Protein:  100-110 grams  Fluid:  1.9-2.1 L  EDUCATION NEEDS:   No education needs identified at this time  Modesto Ganoe A. Mayford Knife, RD, LDN, CDE Pager: [phone] After hours Pager: [phone]

## 2015-05-10 NOTE — Progress Notes (Signed)
Patient discharge teaching given, including activity, diet, follow-up appoints, and medications. Patient verbalized understanding of all discharge instructions. IV access was d/c'd. Vitals are stable. Skin is intact except as charted in most recent assessments. Pt to be escorted out by NT, to be driven home by family. 

## 2015-05-10 NOTE — Plan of Care (Signed)
Problem: Food- and Nutrition-Related Knowledge Deficit (NB-1.1) Goal: Nutrition education Formal process to instruct or train a patient/client in a skill or to impart knowledge to help patients/clients voluntarily manage or modify food choices and eating behavior to maintain or improve health. Outcome: Adequate for Discharge  RD consulted for nutrition education regarding weight loss.  Spoke with RN, who reports pt will likely get discharged later on today.   Pt reports he had a "good lunch" of chicken, mashed potatoes, and vegetables. He reports he is interested in continuing to lose weight and modifying his diet to manage his heart condition. Pt is food insecure and generally consumes 1-2 meals per day. Pt identified resources in his community where he can receive food assistance. Education was focused on budget-friendly healthful meal planning.  Body mass index is 42.06 kg/(m^2). Pt meets criteria for extreme obesity, class III based on current BMI.  RD provided "Weight Loss Tips" handout from the Academy of [employer]. Emphasized the importance of serving sizes and provided examples of correct portions of common foods. Discussed importance of controlled and consistent intake throughout the day. Provided examples of ways to balance meals/snacks and encouraged intake of high-fiber, whole grain complex carbohydrates. Emphasized the importance of hydration with calorie-free beverages and limiting sugar-sweetened beverages. Encouraged pt to discuss physical activity options with physician. Teach back method used.  Expect fair to good compliance.  Current diet order is soft, patient is consuming approximately 90% of meals at this time. Labs and medications reviewed. No further nutrition interventions warranted at this time. RD contact information provided. If additional nutrition issues arise, please re-consult RD.  Evalyn Shultis A. Mayford Knife, RD, LDN, CDE Pager: [phone] After hours Pager:  [phone]

## 2015-05-10 NOTE — Progress Notes (Signed)
Physical Therapy Treatment Patient Details Name: Jorge [name] MRN: [id number] DOB: 07-27-1945 Today's Date: 05/10/2015    History of Present Illness 70 yo male from [facility] with ileus has been admitted to hosp, noted PMHx:  a-fib, HLD, HTN, CHF, CP, morbid obesity, TIA    PT Comments    Pt is much more stable walking today, with O2 sats 94-92% with mobility, able to use IV pole or RW for stability.  Pt understands why he is taking a RW home and agrees that he is in need of extra stability at times.  Follow Up Recommendations  Home health PT;Supervision - Intermittent     Equipment Recommendations  Rolling walker with 5" wheels    Recommendations for Other Services       Precautions / Restrictions Precautions Precautions: Fall Restrictions Weight Bearing Restrictions: No    Mobility  Bed Mobility Overal bed mobility: Modified Independent                Transfers Overall transfer level: Modified independent Equipment used: Rolling walker (2 wheeled) Transfers: Sit to/from [employer] Sit to Stand: Supervision Stand pivot transfers: Supervision       General transfer comment: did separate feet to widen base to ease the standing transition  Ambulation/Gait Ambulation/Gait assistance: Supervision Ambulation Distance (Feet): 80 Feet (40 x 2) Assistive device: None (pushed IV but not supporting much, used RW briefly) Gait Pattern/deviations: Decreased stride length;Wide base of support Gait velocity: reduced Gait velocity interpretation: Below normal speed for age/gender General Gait Details: able to tolerate standing better today as his O2 remained above 90% for entire walk   Stairs            Wheelchair Mobility    Modified Rankin (Stroke Patients Only)       Balance Overall balance assessment: Needs assistance Sitting-balance support: Feet supported Sitting balance-Leahy Scale: Good       Standing balance-Leahy Scale: Fair                       Cognition Arousal/Alertness: Awake/alert Behavior During Therapy: WFL for tasks assessed/performed Overall Cognitive Status: Within Functional Limits for tasks assessed                      Exercises      General Comments General comments (skin integrity, edema, etc.): Pt is home with son nearby in his old house, pt [place] apt [place]      Pertinent Vitals/Pain Pain Assessment: No/denies pain    Home Living                      Prior Function            PT Goals (current goals can now be found in the care plan section) Acute Rehab PT Goals Patient Stated Goal: to get back home safely PT Goal Formulation: With patient (MD in to review treatment goals for home) Progress towards PT goals: Progressing toward goals    Frequency  Min 2X/week    PT Plan Current plan remains appropriate    Co-evaluation             End of Session Equipment Utilized During Treatment: Gait belt Activity Tolerance: Patient tolerated treatment well;Patient limited by fatigue Patient left: in chair;with call bell/phone within reach     Time: 1334-1400 PT Time Calculation (min) (ACUTE ONLY): 26 min  Charges:  $Gait Training: 8-22  mins $Therapeutic Activity: 8-22 mins                    G Codes:      Ivar Drape 06-06-15, 3:04 PM   Samul Dada, PT MS Acute Rehab Dept. Number: [facility] [id number] and [facility] [phone]

## 2015-05-12 DIAGNOSIS — I5032 Chronic diastolic (congestive) heart failure: Secondary | ICD-10-CM | POA: Diagnosis not present

## 2015-05-12 DIAGNOSIS — R2689 Other abnormalities of gait and mobility: Secondary | ICD-10-CM | POA: Diagnosis not present

## 2015-05-12 DIAGNOSIS — M6281 Muscle weakness (generalized): Secondary | ICD-10-CM | POA: Diagnosis not present

## 2015-05-12 DIAGNOSIS — Z87891 Personal history of nicotine dependence: Secondary | ICD-10-CM | POA: Diagnosis not present

## 2015-05-12 DIAGNOSIS — Z8673 Personal history of transient ischemic attack (TIA), and cerebral infarction without residual deficits: Secondary | ICD-10-CM | POA: Diagnosis not present

## 2015-05-12 DIAGNOSIS — Z7901 Long term (current) use of anticoagulants: Secondary | ICD-10-CM | POA: Diagnosis not present

## 2015-05-12 DIAGNOSIS — I2510 Atherosclerotic heart disease of native coronary artery without angina pectoris: Secondary | ICD-10-CM | POA: Diagnosis not present

## 2015-05-12 DIAGNOSIS — G629 Polyneuropathy, unspecified: Secondary | ICD-10-CM | POA: Diagnosis not present

## 2015-05-12 DIAGNOSIS — I110 Hypertensive heart disease with heart failure: Secondary | ICD-10-CM | POA: Diagnosis not present

## 2015-05-12 DIAGNOSIS — I480 Paroxysmal atrial fibrillation: Secondary | ICD-10-CM | POA: Diagnosis not present

## 2015-05-15 DIAGNOSIS — G629 Polyneuropathy, unspecified: Secondary | ICD-10-CM | POA: Diagnosis not present

## 2015-05-15 DIAGNOSIS — R2689 Other abnormalities of gait and mobility: Secondary | ICD-10-CM | POA: Diagnosis not present

## 2015-05-15 DIAGNOSIS — I5032 Chronic diastolic (congestive) heart failure: Secondary | ICD-10-CM | POA: Diagnosis not present

## 2015-05-15 DIAGNOSIS — M6281 Muscle weakness (generalized): Secondary | ICD-10-CM | POA: Diagnosis not present

## 2015-05-15 DIAGNOSIS — I480 Paroxysmal atrial fibrillation: Secondary | ICD-10-CM | POA: Diagnosis not present

## 2015-05-15 DIAGNOSIS — Z87891 Personal history of nicotine dependence: Secondary | ICD-10-CM | POA: Diagnosis not present

## 2015-05-15 DIAGNOSIS — Z7901 Long term (current) use of anticoagulants: Secondary | ICD-10-CM | POA: Diagnosis not present

## 2015-05-15 DIAGNOSIS — I110 Hypertensive heart disease with heart failure: Secondary | ICD-10-CM | POA: Diagnosis not present

## 2015-05-15 DIAGNOSIS — Z8673 Personal history of transient ischemic attack (TIA), and cerebral infarction without residual deficits: Secondary | ICD-10-CM | POA: Diagnosis not present

## 2015-05-15 DIAGNOSIS — I2510 Atherosclerotic heart disease of native coronary artery without angina pectoris: Secondary | ICD-10-CM | POA: Diagnosis not present

## 2015-05-16 DIAGNOSIS — I110 Hypertensive heart disease with heart failure: Secondary | ICD-10-CM | POA: Diagnosis not present

## 2015-05-16 DIAGNOSIS — G629 Polyneuropathy, unspecified: Secondary | ICD-10-CM | POA: Diagnosis not present

## 2015-05-16 DIAGNOSIS — Z7901 Long term (current) use of anticoagulants: Secondary | ICD-10-CM | POA: Diagnosis not present

## 2015-05-16 DIAGNOSIS — Z8673 Personal history of transient ischemic attack (TIA), and cerebral infarction without residual deficits: Secondary | ICD-10-CM | POA: Diagnosis not present

## 2015-05-16 DIAGNOSIS — I480 Paroxysmal atrial fibrillation: Secondary | ICD-10-CM | POA: Diagnosis not present

## 2015-05-16 DIAGNOSIS — R2689 Other abnormalities of gait and mobility: Secondary | ICD-10-CM | POA: Diagnosis not present

## 2015-05-16 DIAGNOSIS — M6281 Muscle weakness (generalized): Secondary | ICD-10-CM | POA: Diagnosis not present

## 2015-05-16 DIAGNOSIS — I5032 Chronic diastolic (congestive) heart failure: Secondary | ICD-10-CM | POA: Diagnosis not present

## 2015-05-16 DIAGNOSIS — I2510 Atherosclerotic heart disease of native coronary artery without angina pectoris: Secondary | ICD-10-CM | POA: Diagnosis not present

## 2015-05-16 DIAGNOSIS — Z87891 Personal history of nicotine dependence: Secondary | ICD-10-CM | POA: Diagnosis not present

## 2015-05-17 ENCOUNTER — Emergency Department (HOSPITAL_COMMUNITY)
Admission: EM | Admit: 2015-05-17 | Discharge: 2015-05-17 | Disposition: A | Discharge status: Home / Self Care | Payer: Medicare Other | Attending: Emergency Medicine | Admitting: Emergency Medicine

## 2015-05-17 ENCOUNTER — Encounter (HOSPITAL_COMMUNITY): Payer: Self-pay | Admitting: Emergency Medicine

## 2015-05-17 DIAGNOSIS — G8929 Other chronic pain: Secondary | ICD-10-CM | POA: Insufficient documentation

## 2015-05-17 DIAGNOSIS — I509 Heart failure, unspecified: Secondary | ICD-10-CM | POA: Diagnosis not present

## 2015-05-17 DIAGNOSIS — Z8673 Personal history of transient ischemic attack (TIA), and cerebral infarction without residual deficits: Secondary | ICD-10-CM | POA: Diagnosis not present

## 2015-05-17 DIAGNOSIS — I5032 Chronic diastolic (congestive) heart failure: Secondary | ICD-10-CM | POA: Diagnosis not present

## 2015-05-17 DIAGNOSIS — M79606 Pain in leg, unspecified: Secondary | ICD-10-CM | POA: Insufficient documentation

## 2015-05-17 DIAGNOSIS — Z87891 Personal history of nicotine dependence: Secondary | ICD-10-CM | POA: Diagnosis not present

## 2015-05-17 DIAGNOSIS — E669 Obesity, unspecified: Secondary | ICD-10-CM | POA: Insufficient documentation

## 2015-05-17 DIAGNOSIS — I480 Paroxysmal atrial fibrillation: Secondary | ICD-10-CM | POA: Diagnosis not present

## 2015-05-17 DIAGNOSIS — I10 Essential (primary) hypertension: Secondary | ICD-10-CM | POA: Diagnosis not present

## 2015-05-17 DIAGNOSIS — Z7901 Long term (current) use of anticoagulants: Secondary | ICD-10-CM | POA: Diagnosis not present

## 2015-05-17 DIAGNOSIS — M6281 Muscle weakness (generalized): Secondary | ICD-10-CM | POA: Diagnosis not present

## 2015-05-17 DIAGNOSIS — I110 Hypertensive heart disease with heart failure: Secondary | ICD-10-CM | POA: Diagnosis not present

## 2015-05-17 DIAGNOSIS — I2510 Atherosclerotic heart disease of native coronary artery without angina pectoris: Secondary | ICD-10-CM | POA: Diagnosis not present

## 2015-05-17 DIAGNOSIS — G629 Polyneuropathy, unspecified: Secondary | ICD-10-CM | POA: Diagnosis not present

## 2015-05-17 DIAGNOSIS — R2689 Other abnormalities of gait and mobility: Secondary | ICD-10-CM | POA: Diagnosis not present

## 2015-05-17 LAB — PROTIME-INR
INR: 1.16 (ref 0.00–1.49)
Prothrombin Time: 14.9 seconds (ref 11.6–15.2)

## 2015-05-17 LAB — COMPREHENSIVE METABOLIC PANEL
ALBUMIN: 3.8 g/dL (ref 3.5–5.0)
ALK PHOS: 96 U/L (ref 38–126)
ALT: 22 U/L (ref 17–63)
ANION GAP: 11 (ref 5–15)
AST: 22 U/L (ref 15–41)
BUN: 19 mg/dL (ref 6–20)
CALCIUM: 9 mg/dL (ref 8.9–10.3)
CO2: 26 mmol/L (ref 22–32)
Chloride: 104 mmol/L (ref 101–111)
Creatinine, Ser: 1.13 mg/dL (ref 0.61–1.24)
GFR calc Af Amer: >60 mL/min (ref >60)
GFR calc non Af Amer: >60 mL/min (ref >60)
GLUCOSE: 98 mg/dL (ref 65–99)
Potassium: 3.8 mmol/L (ref 3.5–5.1)
SODIUM: 141 mmol/L (ref 135–145)
Total Bilirubin: 0.7 mg/dL (ref 0.3–1.2)
Total Protein: 7.3 g/dL (ref 6.5–8.1)

## 2015-05-17 LAB — CBC WITH DIFFERENTIAL/PLATELET
BASOS ABS: 0 10*3/uL (ref 0.0–0.1)
Basophils Relative: 0 %
EOS PCT: 2 %
Eosinophils Absolute: 0.2 10*3/uL (ref 0.0–0.7)
HEMATOCRIT: 43.9 % (ref 39.0–52.0)
HEMOGLOBIN: 14.9 g/dL (ref 13.0–17.0)
LYMPHS ABS: 1.9 10*3/uL (ref 0.7–4.0)
LYMPHS PCT: 21 %
MCH: 31.1 pg (ref 26.0–34.0)
MCHC: 33.9 g/dL (ref 30.0–36.0)
MCV: 91.6 fL (ref 78.0–100.0)
Monocytes Absolute: 0.7 10*3/uL (ref 0.1–1.0)
Monocytes Relative: 8 %
NEUTROS ABS: 5.9 10*3/uL (ref 1.7–7.7)
NEUTROS PCT: 69 %
Platelets: 307 10*3/uL (ref 150–400)
RBC: 4.79 MIL/uL (ref 4.22–5.81)
RDW: 13.9 % (ref 11.5–15.5)
WBC: 8.7 10*3/uL (ref 4.0–10.5)

## 2015-05-17 NOTE — ED Notes (Addendum)
Pt states that he has a cataract and has to leave because he can't drive at night. States he will follow up at [facility] or [facility] tomorrow-- was sent here by his physical therapy assistant to have "study done on leg for blood clots" -- pt left without being seen.

## 2015-05-17 NOTE — ED Notes (Signed)
The patient went to do therapy and was sent here by the therapist to check for blood clots in his legs.   The patient has chronic leg pain but he says is elevated today.  The patient rates his pain 7/10.

## 2015-05-22 DIAGNOSIS — K567 Ileus, unspecified: Secondary | ICD-10-CM | POA: Diagnosis not present

## 2015-05-22 DIAGNOSIS — Z1389 Encounter for screening for other disorder: Secondary | ICD-10-CM | POA: Diagnosis not present

## 2015-05-22 DIAGNOSIS — R350 Frequency of micturition: Secondary | ICD-10-CM | POA: Diagnosis not present

## 2015-05-23 DIAGNOSIS — M6281 Muscle weakness (generalized): Secondary | ICD-10-CM | POA: Diagnosis not present

## 2015-05-23 DIAGNOSIS — Z8673 Personal history of transient ischemic attack (TIA), and cerebral infarction without residual deficits: Secondary | ICD-10-CM | POA: Diagnosis not present

## 2015-05-23 DIAGNOSIS — I5032 Chronic diastolic (congestive) heart failure: Secondary | ICD-10-CM | POA: Diagnosis not present

## 2015-05-23 DIAGNOSIS — I2510 Atherosclerotic heart disease of native coronary artery without angina pectoris: Secondary | ICD-10-CM | POA: Diagnosis not present

## 2015-05-23 DIAGNOSIS — I480 Paroxysmal atrial fibrillation: Secondary | ICD-10-CM | POA: Diagnosis not present

## 2015-05-23 DIAGNOSIS — I110 Hypertensive heart disease with heart failure: Secondary | ICD-10-CM | POA: Diagnosis not present

## 2015-05-23 DIAGNOSIS — R2689 Other abnormalities of gait and mobility: Secondary | ICD-10-CM | POA: Diagnosis not present

## 2015-05-23 DIAGNOSIS — Z87891 Personal history of nicotine dependence: Secondary | ICD-10-CM | POA: Diagnosis not present

## 2015-05-23 DIAGNOSIS — Z7901 Long term (current) use of anticoagulants: Secondary | ICD-10-CM | POA: Diagnosis not present

## 2015-05-23 DIAGNOSIS — G629 Polyneuropathy, unspecified: Secondary | ICD-10-CM | POA: Diagnosis not present

## 2015-05-24 DIAGNOSIS — G629 Polyneuropathy, unspecified: Secondary | ICD-10-CM | POA: Diagnosis not present

## 2015-05-24 DIAGNOSIS — M6281 Muscle weakness (generalized): Secondary | ICD-10-CM | POA: Diagnosis not present

## 2015-05-24 DIAGNOSIS — Z87891 Personal history of nicotine dependence: Secondary | ICD-10-CM | POA: Diagnosis not present

## 2015-05-24 DIAGNOSIS — I480 Paroxysmal atrial fibrillation: Secondary | ICD-10-CM | POA: Diagnosis not present

## 2015-05-24 DIAGNOSIS — Z8673 Personal history of transient ischemic attack (TIA), and cerebral infarction without residual deficits: Secondary | ICD-10-CM | POA: Diagnosis not present

## 2015-05-24 DIAGNOSIS — R2689 Other abnormalities of gait and mobility: Secondary | ICD-10-CM | POA: Diagnosis not present

## 2015-05-24 DIAGNOSIS — I5032 Chronic diastolic (congestive) heart failure: Secondary | ICD-10-CM | POA: Diagnosis not present

## 2015-05-24 DIAGNOSIS — I110 Hypertensive heart disease with heart failure: Secondary | ICD-10-CM | POA: Diagnosis not present

## 2015-05-24 DIAGNOSIS — I2510 Atherosclerotic heart disease of native coronary artery without angina pectoris: Secondary | ICD-10-CM | POA: Diagnosis not present

## 2015-05-24 DIAGNOSIS — Z7901 Long term (current) use of anticoagulants: Secondary | ICD-10-CM | POA: Diagnosis not present

## 2015-05-25 DIAGNOSIS — M6281 Muscle weakness (generalized): Secondary | ICD-10-CM | POA: Diagnosis not present

## 2015-05-25 DIAGNOSIS — I110 Hypertensive heart disease with heart failure: Secondary | ICD-10-CM | POA: Diagnosis not present

## 2015-05-25 DIAGNOSIS — I480 Paroxysmal atrial fibrillation: Secondary | ICD-10-CM | POA: Diagnosis not present

## 2015-05-25 DIAGNOSIS — Z8673 Personal history of transient ischemic attack (TIA), and cerebral infarction without residual deficits: Secondary | ICD-10-CM | POA: Diagnosis not present

## 2015-05-25 DIAGNOSIS — Z87891 Personal history of nicotine dependence: Secondary | ICD-10-CM | POA: Diagnosis not present

## 2015-05-25 DIAGNOSIS — I2510 Atherosclerotic heart disease of native coronary artery without angina pectoris: Secondary | ICD-10-CM | POA: Diagnosis not present

## 2015-05-25 DIAGNOSIS — G629 Polyneuropathy, unspecified: Secondary | ICD-10-CM | POA: Diagnosis not present

## 2015-05-25 DIAGNOSIS — Z7901 Long term (current) use of anticoagulants: Secondary | ICD-10-CM | POA: Diagnosis not present

## 2015-05-25 DIAGNOSIS — R2689 Other abnormalities of gait and mobility: Secondary | ICD-10-CM | POA: Diagnosis not present

## 2015-05-25 DIAGNOSIS — I5032 Chronic diastolic (congestive) heart failure: Secondary | ICD-10-CM | POA: Diagnosis not present
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
## 2015-05-28 DIAGNOSIS — G629 Polyneuropathy, unspecified: Secondary | ICD-10-CM | POA: Diagnosis not present

## 2015-05-28 DIAGNOSIS — R2689 Other abnormalities of gait and mobility: Secondary | ICD-10-CM | POA: Diagnosis not present

## 2015-05-28 DIAGNOSIS — I110 Hypertensive heart disease with heart failure: Secondary | ICD-10-CM | POA: Diagnosis not present

## 2015-05-28 DIAGNOSIS — Z8673 Personal history of transient ischemic attack (TIA), and cerebral infarction without residual deficits: Secondary | ICD-10-CM | POA: Diagnosis not present

## 2015-05-28 DIAGNOSIS — I480 Paroxysmal atrial fibrillation: Secondary | ICD-10-CM | POA: Diagnosis not present

## 2015-05-28 DIAGNOSIS — I5032 Chronic diastolic (congestive) heart failure: Secondary | ICD-10-CM | POA: Diagnosis not present

## 2015-05-28 DIAGNOSIS — I2510 Atherosclerotic heart disease of native coronary artery without angina pectoris: Secondary | ICD-10-CM | POA: Diagnosis not present

## 2015-05-28 DIAGNOSIS — Z7901 Long term (current) use of anticoagulants: Secondary | ICD-10-CM | POA: Diagnosis not present

## 2015-05-28 DIAGNOSIS — Z87891 Personal history of nicotine dependence: Secondary | ICD-10-CM | POA: Diagnosis not present

## 2015-05-28 DIAGNOSIS — M6281 Muscle weakness (generalized): Secondary | ICD-10-CM | POA: Diagnosis not present

## 2015-05-31 DIAGNOSIS — I110 Hypertensive heart disease with heart failure: Secondary | ICD-10-CM | POA: Diagnosis not present

## 2015-05-31 DIAGNOSIS — Z87891 Personal history of nicotine dependence: Secondary | ICD-10-CM | POA: Diagnosis not present

## 2015-05-31 DIAGNOSIS — I5032 Chronic diastolic (congestive) heart failure: Secondary | ICD-10-CM | POA: Diagnosis not present

## 2015-05-31 DIAGNOSIS — R2689 Other abnormalities of gait and mobility: Secondary | ICD-10-CM | POA: Diagnosis not present

## 2015-05-31 DIAGNOSIS — G629 Polyneuropathy, unspecified: Secondary | ICD-10-CM | POA: Diagnosis not present

## 2015-05-31 DIAGNOSIS — M6281 Muscle weakness (generalized): Secondary | ICD-10-CM | POA: Diagnosis not present

## 2015-05-31 DIAGNOSIS — Z8673 Personal history of transient ischemic attack (TIA), and cerebral infarction without residual deficits: Secondary | ICD-10-CM | POA: Diagnosis not present

## 2015-05-31 DIAGNOSIS — I2510 Atherosclerotic heart disease of native coronary artery without angina pectoris: Secondary | ICD-10-CM | POA: Diagnosis not present

## 2015-05-31 DIAGNOSIS — I480 Paroxysmal atrial fibrillation: Secondary | ICD-10-CM | POA: Diagnosis not present

## 2015-05-31 DIAGNOSIS — Z7901 Long term (current) use of anticoagulants: Secondary | ICD-10-CM | POA: Diagnosis not present

## 2015-06-01 ENCOUNTER — Encounter: Payer: Self-pay | Admitting: Cardiovascular Disease

## 2015-06-01 ENCOUNTER — Ambulatory Visit (INDEPENDENT_AMBULATORY_CARE_PROVIDER_SITE_OTHER): Payer: Medicare Other | Admitting: Cardiovascular Disease

## 2015-06-01 VITALS — BP 96/70 | HR 62 | Ht 74.0 in | Wt 327.0 lb

## 2015-06-01 DIAGNOSIS — I6529 Occlusion and stenosis of unspecified carotid artery: Secondary | ICD-10-CM | POA: Diagnosis not present

## 2015-06-01 DIAGNOSIS — R0602 Shortness of breath: Secondary | ICD-10-CM | POA: Diagnosis not present

## 2015-06-01 NOTE — Patient Instructions (Signed)
Medication Instructions:  Your physician recommends that you continue on your current medications as directed. Please refer to the Current Medication list given to you today.  Labwork: NONE  Testing/Procedures: Your physician has requested that you have an echocardiogram. Echocardiography is a painless test that uses sound waves to create images of your heart. It provides your doctor with information about the size and shape of your heart and how well your heart's chambers and valves are working. This procedure takes approximately one hour. There are no restrictions for this procedure.  Follow-Up: Your physician wants you to follow-up in: 12 months with Dr. Nishan. You will receive a reminder letter in the mail two months in advance. If you don't receive a letter, please call our office to schedule the follow-up appointment.  If you need a refill on your cardiac medications before your next appointment, please call your pharmacy.   

## 2015-06-06 DIAGNOSIS — G629 Polyneuropathy, unspecified: Secondary | ICD-10-CM | POA: Diagnosis not present

## 2015-06-06 DIAGNOSIS — Z8673 Personal history of transient ischemic attack (TIA), and cerebral infarction without residual deficits: Secondary | ICD-10-CM | POA: Diagnosis not present

## 2015-06-06 DIAGNOSIS — I2510 Atherosclerotic heart disease of native coronary artery without angina pectoris: Secondary | ICD-10-CM | POA: Diagnosis not present

## 2015-06-06 DIAGNOSIS — R2689 Other abnormalities of gait and mobility: Secondary | ICD-10-CM | POA: Diagnosis not present

## 2015-06-06 DIAGNOSIS — Z87891 Personal history of nicotine dependence: Secondary | ICD-10-CM | POA: Diagnosis not present

## 2015-06-06 DIAGNOSIS — Z7901 Long term (current) use of anticoagulants: Secondary | ICD-10-CM | POA: Diagnosis not present

## 2015-06-06 DIAGNOSIS — M6281 Muscle weakness (generalized): Secondary | ICD-10-CM | POA: Diagnosis not present

## 2015-06-06 DIAGNOSIS — I5032 Chronic diastolic (congestive) heart failure: Secondary | ICD-10-CM | POA: Diagnosis not present

## 2015-06-06 DIAGNOSIS — I110 Hypertensive heart disease with heart failure: Secondary | ICD-10-CM | POA: Diagnosis not present

## 2015-06-06 DIAGNOSIS — I480 Paroxysmal atrial fibrillation: Secondary | ICD-10-CM | POA: Diagnosis not present

## 2015-06-07 ENCOUNTER — Other Ambulatory Visit: Payer: Self-pay

## 2015-06-07 ENCOUNTER — Ambulatory Visit (HOSPITAL_COMMUNITY): Payer: Medicare Other | Attending: Cardiology

## 2015-06-07 DIAGNOSIS — Z87891 Personal history of nicotine dependence: Secondary | ICD-10-CM | POA: Insufficient documentation

## 2015-06-07 DIAGNOSIS — Z6841 Body Mass Index (BMI) 40.0 and over, adult: Secondary | ICD-10-CM | POA: Diagnosis not present

## 2015-06-07 DIAGNOSIS — E669 Obesity, unspecified: Secondary | ICD-10-CM | POA: Diagnosis not present

## 2015-06-07 DIAGNOSIS — I352 Nonrheumatic aortic (valve) stenosis with insufficiency: Secondary | ICD-10-CM | POA: Insufficient documentation

## 2015-06-07 DIAGNOSIS — I119 Hypertensive heart disease without heart failure: Secondary | ICD-10-CM | POA: Diagnosis not present

## 2015-06-07 DIAGNOSIS — R0602 Shortness of breath: Secondary | ICD-10-CM | POA: Diagnosis not present

## 2015-06-07 DIAGNOSIS — I059 Rheumatic mitral valve disease, unspecified: Secondary | ICD-10-CM | POA: Insufficient documentation

## 2015-06-07 DIAGNOSIS — R0600 Dyspnea, unspecified: Secondary | ICD-10-CM | POA: Diagnosis present

## 2015-06-08 ENCOUNTER — Telehealth: Payer: Self-pay

## 2015-06-08 DIAGNOSIS — IMO0001 Reserved for inherently not codable concepts without codable children: Secondary | ICD-10-CM

## 2015-06-08 DIAGNOSIS — G629 Polyneuropathy, unspecified: Secondary | ICD-10-CM | POA: Diagnosis not present

## 2015-06-08 DIAGNOSIS — M6281 Muscle weakness (generalized): Secondary | ICD-10-CM | POA: Diagnosis not present

## 2015-06-08 DIAGNOSIS — I480 Paroxysmal atrial fibrillation: Secondary | ICD-10-CM | POA: Diagnosis not present

## 2015-06-08 DIAGNOSIS — Z7901 Long term (current) use of anticoagulants: Secondary | ICD-10-CM | POA: Diagnosis not present

## 2015-06-08 DIAGNOSIS — R2689 Other abnormalities of gait and mobility: Secondary | ICD-10-CM | POA: Diagnosis not present

## 2015-06-08 DIAGNOSIS — I110 Hypertensive heart disease with heart failure: Secondary | ICD-10-CM | POA: Diagnosis not present

## 2015-06-08 DIAGNOSIS — I5032 Chronic diastolic (congestive) heart failure: Secondary | ICD-10-CM | POA: Diagnosis not present

## 2015-06-08 DIAGNOSIS — Z87891 Personal history of nicotine dependence: Secondary | ICD-10-CM | POA: Diagnosis not present

## 2015-06-08 DIAGNOSIS — Z8673 Personal history of transient ischemic attack (TIA), and cerebral infarction without residual deficits: Secondary | ICD-10-CM | POA: Diagnosis not present

## 2015-06-08 DIAGNOSIS — I2510 Atherosclerotic heart disease of native coronary artery without angina pectoris: Secondary | ICD-10-CM | POA: Diagnosis not present

## 2015-06-08 NOTE — Telephone Encounter (Signed)
Called patient about echo results. Per Dr. Eden EmmsNishan, moderate AS and to follow-up with echo in 6 months. Patient verbalized understanding. Recall and order in for echo follow-up in 6 months

## 2015-06-11 DIAGNOSIS — Z87891 Personal history of nicotine dependence: Secondary | ICD-10-CM | POA: Diagnosis not present

## 2015-06-11 DIAGNOSIS — R2689 Other abnormalities of gait and mobility: Secondary | ICD-10-CM | POA: Diagnosis not present

## 2015-06-11 DIAGNOSIS — M6281 Muscle weakness (generalized): Secondary | ICD-10-CM | POA: Diagnosis not present

## 2015-06-11 DIAGNOSIS — I110 Hypertensive heart disease with heart failure: Secondary | ICD-10-CM | POA: Diagnosis not present

## 2015-06-11 DIAGNOSIS — Z8673 Personal history of transient ischemic attack (TIA), and cerebral infarction without residual deficits: Secondary | ICD-10-CM | POA: Diagnosis not present

## 2015-06-11 DIAGNOSIS — G629 Polyneuropathy, unspecified: Secondary | ICD-10-CM | POA: Diagnosis not present

## 2015-06-11 DIAGNOSIS — Z7901 Long term (current) use of anticoagulants: Secondary | ICD-10-CM | POA: Diagnosis not present

## 2015-06-11 DIAGNOSIS — I480 Paroxysmal atrial fibrillation: Secondary | ICD-10-CM | POA: Diagnosis not present

## 2015-06-11 DIAGNOSIS — I5032 Chronic diastolic (congestive) heart failure: Secondary | ICD-10-CM | POA: Diagnosis not present

## 2015-06-11 DIAGNOSIS — I2510 Atherosclerotic heart disease of native coronary artery without angina pectoris: Secondary | ICD-10-CM | POA: Diagnosis not present

## 2015-06-15 DIAGNOSIS — G629 Polyneuropathy, unspecified: Secondary | ICD-10-CM | POA: Diagnosis not present

## 2015-06-15 DIAGNOSIS — I110 Hypertensive heart disease with heart failure: Secondary | ICD-10-CM | POA: Diagnosis not present

## 2015-06-15 DIAGNOSIS — R2689 Other abnormalities of gait and mobility: Secondary | ICD-10-CM | POA: Diagnosis not present

## 2015-06-15 DIAGNOSIS — Z87891 Personal history of nicotine dependence: Secondary | ICD-10-CM | POA: Diagnosis not present

## 2015-06-15 DIAGNOSIS — Z8673 Personal history of transient ischemic attack (TIA), and cerebral infarction without residual deficits: Secondary | ICD-10-CM | POA: Diagnosis not present

## 2015-06-15 DIAGNOSIS — I480 Paroxysmal atrial fibrillation: Secondary | ICD-10-CM | POA: Diagnosis not present

## 2015-06-15 DIAGNOSIS — I5032 Chronic diastolic (congestive) heart failure: Secondary | ICD-10-CM | POA: Diagnosis not present

## 2015-06-15 DIAGNOSIS — Z7901 Long term (current) use of anticoagulants: Secondary | ICD-10-CM | POA: Diagnosis not present

## 2015-06-15 DIAGNOSIS — M6281 Muscle weakness (generalized): Secondary | ICD-10-CM | POA: Diagnosis not present

## 2015-06-15 DIAGNOSIS — I2510 Atherosclerotic heart disease of native coronary artery without angina pectoris: Secondary | ICD-10-CM | POA: Diagnosis not present

## 2015-06-22 DIAGNOSIS — Z87891 Personal history of nicotine dependence: Secondary | ICD-10-CM | POA: Diagnosis not present

## 2015-06-22 DIAGNOSIS — G629 Polyneuropathy, unspecified: Secondary | ICD-10-CM | POA: Diagnosis not present

## 2015-06-22 DIAGNOSIS — I110 Hypertensive heart disease with heart failure: Secondary | ICD-10-CM | POA: Diagnosis not present

## 2015-06-22 DIAGNOSIS — I2510 Atherosclerotic heart disease of native coronary artery without angina pectoris: Secondary | ICD-10-CM | POA: Diagnosis not present

## 2015-06-22 DIAGNOSIS — M6281 Muscle weakness (generalized): Secondary | ICD-10-CM | POA: Diagnosis not present

## 2015-06-22 DIAGNOSIS — I480 Paroxysmal atrial fibrillation: Secondary | ICD-10-CM | POA: Diagnosis not present

## 2015-06-22 DIAGNOSIS — I5032 Chronic diastolic (congestive) heart failure: Secondary | ICD-10-CM | POA: Diagnosis not present

## 2015-06-22 DIAGNOSIS — Z7901 Long term (current) use of anticoagulants: Secondary | ICD-10-CM | POA: Diagnosis not present

## 2015-06-22 DIAGNOSIS — Z8673 Personal history of transient ischemic attack (TIA), and cerebral infarction without residual deficits: Secondary | ICD-10-CM | POA: Diagnosis not present

## 2015-06-22 DIAGNOSIS — R2689 Other abnormalities of gait and mobility: Secondary | ICD-10-CM | POA: Diagnosis not present

## 2015-06-26 DIAGNOSIS — I2510 Atherosclerotic heart disease of native coronary artery without angina pectoris: Secondary | ICD-10-CM | POA: Diagnosis not present

## 2015-06-26 DIAGNOSIS — I110 Hypertensive heart disease with heart failure: Secondary | ICD-10-CM | POA: Diagnosis not present

## 2015-06-26 DIAGNOSIS — M6281 Muscle weakness (generalized): Secondary | ICD-10-CM | POA: Diagnosis not present

## 2015-06-26 DIAGNOSIS — I5032 Chronic diastolic (congestive) heart failure: Secondary | ICD-10-CM | POA: Diagnosis not present

## 2015-06-26 DIAGNOSIS — Z7901 Long term (current) use of anticoagulants: Secondary | ICD-10-CM | POA: Diagnosis not present

## 2015-06-26 DIAGNOSIS — G629 Polyneuropathy, unspecified: Secondary | ICD-10-CM | POA: Diagnosis not present

## 2015-06-26 DIAGNOSIS — Z87891 Personal history of nicotine dependence: Secondary | ICD-10-CM | POA: Diagnosis not present

## 2015-06-26 DIAGNOSIS — Z8673 Personal history of transient ischemic attack (TIA), and cerebral infarction without residual deficits: Secondary | ICD-10-CM | POA: Diagnosis not present

## 2015-06-26 DIAGNOSIS — R2689 Other abnormalities of gait and mobility: Secondary | ICD-10-CM | POA: Diagnosis not present

## 2015-06-26 DIAGNOSIS — I480 Paroxysmal atrial fibrillation: Secondary | ICD-10-CM | POA: Diagnosis not present

## 2015-07-06 DIAGNOSIS — M6281 Muscle weakness (generalized): Secondary | ICD-10-CM | POA: Diagnosis not present

## 2015-07-06 DIAGNOSIS — Z8673 Personal history of transient ischemic attack (TIA), and cerebral infarction without residual deficits: Secondary | ICD-10-CM | POA: Diagnosis not present

## 2015-07-06 DIAGNOSIS — I110 Hypertensive heart disease with heart failure: Secondary | ICD-10-CM | POA: Diagnosis not present

## 2015-07-06 DIAGNOSIS — Z87891 Personal history of nicotine dependence: Secondary | ICD-10-CM | POA: Diagnosis not present

## 2015-07-06 DIAGNOSIS — I480 Paroxysmal atrial fibrillation: Secondary | ICD-10-CM | POA: Diagnosis not present

## 2015-07-06 DIAGNOSIS — I5032 Chronic diastolic (congestive) heart failure: Secondary | ICD-10-CM | POA: Diagnosis not present

## 2015-07-06 DIAGNOSIS — Z7901 Long term (current) use of anticoagulants: Secondary | ICD-10-CM | POA: Diagnosis not present

## 2015-07-06 DIAGNOSIS — R2689 Other abnormalities of gait and mobility: Secondary | ICD-10-CM | POA: Diagnosis not present

## 2015-07-06 DIAGNOSIS — G629 Polyneuropathy, unspecified: Secondary | ICD-10-CM | POA: Diagnosis not present

## 2015-07-06 DIAGNOSIS — I2510 Atherosclerotic heart disease of native coronary artery without angina pectoris: Secondary | ICD-10-CM | POA: Diagnosis not present

## 2015-09-21 DIAGNOSIS — E782 Mixed hyperlipidemia: Secondary | ICD-10-CM | POA: Diagnosis not present

## 2015-09-21 DIAGNOSIS — R7303 Prediabetes: Secondary | ICD-10-CM | POA: Diagnosis not present

## 2015-09-21 DIAGNOSIS — Z79899 Other long term (current) drug therapy: Secondary | ICD-10-CM | POA: Diagnosis not present

## 2015-09-21 DIAGNOSIS — Z125 Encounter for screening for malignant neoplasm of prostate: Secondary | ICD-10-CM | POA: Diagnosis not present

## 2015-09-25 DIAGNOSIS — I10 Essential (primary) hypertension: Secondary | ICD-10-CM | POA: Diagnosis not present

## 2015-09-25 DIAGNOSIS — R609 Edema, unspecified: Secondary | ICD-10-CM | POA: Diagnosis not present

## 2015-09-25 DIAGNOSIS — G5791 Unspecified mononeuropathy of right lower limb: Secondary | ICD-10-CM | POA: Diagnosis not present

## 2015-09-25 DIAGNOSIS — E782 Mixed hyperlipidemia: Secondary | ICD-10-CM | POA: Diagnosis not present

## 2015-11-19 DIAGNOSIS — Z9181 History of falling: Secondary | ICD-10-CM | POA: Diagnosis not present

## 2015-11-19 DIAGNOSIS — I10 Essential (primary) hypertension: Secondary | ICD-10-CM | POA: Diagnosis not present

## 2015-11-19 DIAGNOSIS — K5900 Constipation, unspecified: Secondary | ICD-10-CM | POA: Diagnosis not present

## 2015-11-19 DIAGNOSIS — G5741 Lesion of medial popliteal nerve, right lower limb: Secondary | ICD-10-CM | POA: Diagnosis not present

## 2015-12-18 ENCOUNTER — Other Ambulatory Visit (HOSPITAL_COMMUNITY): Payer: Self-pay

## 2016-01-07 ENCOUNTER — Other Ambulatory Visit (HOSPITAL_COMMUNITY): Payer: Self-pay

## 2016-01-18 ENCOUNTER — Other Ambulatory Visit (HOSPITAL_COMMUNITY): Payer: Self-pay

## 2016-02-04 ENCOUNTER — Ambulatory Visit (HOSPITAL_COMMUNITY): Payer: Medicare Other | Attending: Cardiology

## 2016-02-04 ENCOUNTER — Other Ambulatory Visit: Payer: Self-pay

## 2016-02-04 DIAGNOSIS — IMO0001 Reserved for inherently not codable concepts without codable children: Secondary | ICD-10-CM

## 2016-02-04 DIAGNOSIS — I350 Nonrheumatic aortic (valve) stenosis: Secondary | ICD-10-CM | POA: Diagnosis not present
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
## 2016-04-09 ENCOUNTER — Ambulatory Visit: Payer: Medicare Other | Admitting: Cardiology

## 2016-04-28 ENCOUNTER — Observation Stay (HOSPITAL_COMMUNITY): Payer: Medicare Other

## 2016-04-28 ENCOUNTER — Emergency Department (HOSPITAL_COMMUNITY): Payer: Medicare Other

## 2016-04-28 ENCOUNTER — Encounter (HOSPITAL_COMMUNITY): Payer: Self-pay

## 2016-04-28 ENCOUNTER — Inpatient Hospital Stay (HOSPITAL_COMMUNITY)
Admission: EM | Admit: 2016-04-28 | Discharge: 2016-05-09 | DRG: 266 | Disposition: A | Discharge status: Home Health | Payer: Medicare Other | Attending: Internal Medicine | Admitting: Internal Medicine

## 2016-04-28 DIAGNOSIS — K029 Dental caries, unspecified: Secondary | ICD-10-CM | POA: Diagnosis present

## 2016-04-28 DIAGNOSIS — K0530 Chronic periodontitis, unspecified: Secondary | ICD-10-CM | POA: Diagnosis present

## 2016-04-28 DIAGNOSIS — I5030 Unspecified diastolic (congestive) heart failure: Secondary | ICD-10-CM

## 2016-04-28 DIAGNOSIS — I352 Nonrheumatic aortic (valve) stenosis with insufficiency: Secondary | ICD-10-CM | POA: Diagnosis not present

## 2016-04-28 DIAGNOSIS — I214 Non-ST elevation (NSTEMI) myocardial infarction: Secondary | ICD-10-CM

## 2016-04-28 DIAGNOSIS — E784 Other hyperlipidemia: Secondary | ICD-10-CM

## 2016-04-28 DIAGNOSIS — T148XXA Other injury of unspecified body region, initial encounter: Secondary | ICD-10-CM

## 2016-04-28 DIAGNOSIS — I5032 Chronic diastolic (congestive) heart failure: Secondary | ICD-10-CM | POA: Diagnosis not present

## 2016-04-28 DIAGNOSIS — W19XXXA Unspecified fall, initial encounter: Secondary | ICD-10-CM | POA: Insufficient documentation

## 2016-04-28 DIAGNOSIS — I480 Paroxysmal atrial fibrillation: Secondary | ICD-10-CM | POA: Diagnosis present

## 2016-04-28 DIAGNOSIS — R0789 Other chest pain: Secondary | ICD-10-CM | POA: Diagnosis present

## 2016-04-28 DIAGNOSIS — I509 Heart failure, unspecified: Secondary | ICD-10-CM

## 2016-04-28 DIAGNOSIS — I7771 Dissection of carotid artery: Secondary | ICD-10-CM | POA: Diagnosis present

## 2016-04-28 DIAGNOSIS — I2510 Atherosclerotic heart disease of native coronary artery without angina pectoris: Secondary | ICD-10-CM | POA: Diagnosis present

## 2016-04-28 DIAGNOSIS — R079 Chest pain, unspecified: Secondary | ICD-10-CM | POA: Diagnosis not present

## 2016-04-28 DIAGNOSIS — Z6841 Body Mass Index (BMI) 40.0 and over, adult: Secondary | ICD-10-CM

## 2016-04-28 DIAGNOSIS — Z7901 Long term (current) use of anticoagulants: Secondary | ICD-10-CM

## 2016-04-28 DIAGNOSIS — F419 Anxiety disorder, unspecified: Secondary | ICD-10-CM | POA: Diagnosis present

## 2016-04-28 DIAGNOSIS — Z006 Encounter for examination for normal comparison and control in clinical research program: Secondary | ICD-10-CM

## 2016-04-28 DIAGNOSIS — I110 Hypertensive heart disease with heart failure: Secondary | ICD-10-CM | POA: Diagnosis present

## 2016-04-28 DIAGNOSIS — R0600 Dyspnea, unspecified: Secondary | ICD-10-CM | POA: Diagnosis present

## 2016-04-28 DIAGNOSIS — I951 Orthostatic hypotension: Secondary | ICD-10-CM

## 2016-04-28 DIAGNOSIS — Z87891 Personal history of nicotine dependence: Secondary | ICD-10-CM

## 2016-04-28 DIAGNOSIS — Z9889 Other specified postprocedural states: Secondary | ICD-10-CM

## 2016-04-28 DIAGNOSIS — I700 Atherosclerosis of aorta: Secondary | ICD-10-CM | POA: Diagnosis present

## 2016-04-28 DIAGNOSIS — F329 Major depressive disorder, single episode, unspecified: Secondary | ICD-10-CM | POA: Diagnosis present

## 2016-04-28 DIAGNOSIS — Z8673 Personal history of transient ischemic attack (TIA), and cerebral infarction without residual deficits: Secondary | ICD-10-CM

## 2016-04-28 DIAGNOSIS — Z79899 Other long term (current) drug therapy: Secondary | ICD-10-CM

## 2016-04-28 DIAGNOSIS — I350 Nonrheumatic aortic (valve) stenosis: Secondary | ICD-10-CM

## 2016-04-28 DIAGNOSIS — R55 Syncope and collapse: Secondary | ICD-10-CM

## 2016-04-28 DIAGNOSIS — K5900 Constipation, unspecified: Secondary | ICD-10-CM | POA: Diagnosis not present

## 2016-04-28 DIAGNOSIS — W182XXA Fall in (into) shower or empty bathtub, initial encounter: Secondary | ICD-10-CM | POA: Diagnosis present

## 2016-04-28 DIAGNOSIS — R0902 Hypoxemia: Secondary | ICD-10-CM

## 2016-04-28 DIAGNOSIS — I252 Old myocardial infarction: Secondary | ICD-10-CM

## 2016-04-28 DIAGNOSIS — S42202A Unspecified fracture of upper end of left humerus, initial encounter for closed fracture: Secondary | ICD-10-CM | POA: Diagnosis present

## 2016-04-28 DIAGNOSIS — E785 Hyperlipidemia, unspecified: Secondary | ICD-10-CM | POA: Diagnosis present

## 2016-04-28 DIAGNOSIS — Z01818 Encounter for other preprocedural examination: Secondary | ICD-10-CM

## 2016-04-28 DIAGNOSIS — I6523 Occlusion and stenosis of bilateral carotid arteries: Secondary | ICD-10-CM | POA: Diagnosis present

## 2016-04-28 DIAGNOSIS — I708 Atherosclerosis of other arteries: Secondary | ICD-10-CM | POA: Diagnosis present

## 2016-04-28 DIAGNOSIS — G629 Polyneuropathy, unspecified: Secondary | ICD-10-CM | POA: Diagnosis present

## 2016-04-28 DIAGNOSIS — G4733 Obstructive sleep apnea (adult) (pediatric): Secondary | ICD-10-CM | POA: Diagnosis present

## 2016-04-28 DIAGNOSIS — Z953 Presence of xenogenic heart valve: Secondary | ICD-10-CM

## 2016-04-28 HISTORY — DX: Unspecified atrial fibrillation: I48.91

## 2016-04-28 LAB — CBC
HCT: 42.8 % (ref 39.0–52.0)
HEMOGLOBIN: 13.7 g/dL (ref 13.0–17.0)
MCH: 30.2 pg (ref 26.0–34.0)
MCHC: 32 g/dL (ref 30.0–36.0)
MCV: 94.5 fL (ref 78.0–100.0)
PLATELETS: 306 10*3/uL (ref 150–400)
RBC: 4.53 MIL/uL (ref 4.22–5.81)
RDW: 14.7 % (ref 11.5–15.5)
WBC: 14.2 10*3/uL — ABNORMAL HIGH (ref 4.0–10.5)

## 2016-04-28 LAB — COMPREHENSIVE METABOLIC PANEL
ALBUMIN: 3.8 g/dL (ref 3.5–5.0)
ALT: 17 U/L (ref 17–63)
AST: 21 U/L (ref 15–41)
Alkaline Phosphatase: 219 U/L — ABNORMAL HIGH (ref 38–126)
Anion gap: 9 (ref 5–15)
BILIRUBIN TOTAL: 0.5 mg/dL (ref 0.3–1.2)
BUN: 11 mg/dL (ref 6–20)
CO2: 26 mmol/L (ref 22–32)
CREATININE: 0.99 mg/dL (ref 0.61–1.24)
Calcium: 8.9 mg/dL (ref 8.9–10.3)
Chloride: 103 mmol/L (ref 101–111)
GFR calc Af Amer: >60 mL/min (ref >60)
GLUCOSE: 132 mg/dL — AB (ref 65–99)
POTASSIUM: 3.7 mmol/L (ref 3.5–5.1)
Sodium: 138 mmol/L (ref 135–145)
TOTAL PROTEIN: 7.3 g/dL (ref 6.5–8.1)

## 2016-04-28 LAB — URINALYSIS, ROUTINE W REFLEX MICROSCOPIC
Bacteria, UA: NONE SEEN
Bilirubin Urine: NEGATIVE
GLUCOSE, UA: NEGATIVE mg/dL
Ketones, ur: NEGATIVE mg/dL
Leukocytes, UA: NEGATIVE
NITRITE: NEGATIVE
PH: 7 (ref 5.0–8.0)
PROTEIN: NEGATIVE mg/dL
SPECIFIC GRAVITY, URINE: 1.011 (ref 1.005–1.030)

## 2016-04-28 LAB — CK: Total CK: 148 U/L (ref 49–397)

## 2016-04-28 LAB — I-STAT TROPONIN, ED: Troponin i, poc: 0.01 ng/mL (ref 0.00–0.08)

## 2016-04-28 LAB — TROPONIN I: TROPONIN I: 0.05 ng/mL — AB (ref <0.03)

## 2016-04-28 LAB — CBG MONITORING, ED: Glucose-Capillary: 136 mg/dL — ABNORMAL HIGH (ref 65–99)

## 2016-04-28 LAB — D-DIMER, QUANTITATIVE: D-Dimer, Quant: 3.28 ug/mL-FEU — ABNORMAL HIGH (ref 0.00–0.50)

## 2016-04-28 MED ORDER — APIXABAN 5 MG PO TABS
5.0000 mg | ORAL_TABLET | Freq: Two times a day (BID) | ORAL | Status: DC
  Administered 2016-04-28: 5 mg via ORAL
  Filled 2016-04-28: qty 1

## 2016-04-28 MED ORDER — IOPAMIDOL (ISOVUE-370) INJECTION 76%
INTRAVENOUS | Status: AC
  Filled 2016-04-28: qty 100

## 2016-04-28 MED ORDER — ALPRAZOLAM 0.5 MG PO TABS
0.5000 mg | ORAL_TABLET | Freq: Every day | ORAL | Status: DC
  Administered 2016-04-28 – 2016-05-08 (×11): 0.5 mg via ORAL
  Filled 2016-04-28 (×11): qty 1

## 2016-04-28 MED ORDER — SODIUM CHLORIDE 0.9 % IV SOLN
INTRAVENOUS | Status: DC
  Administered 2016-04-29: 11:00:00 via INTRAVENOUS

## 2016-04-28 MED ORDER — FUROSEMIDE 40 MG PO TABS: 40.0000 mg | ORAL_TABLET | Freq: Every day | ORAL | Status: DC

## 2016-04-28 MED ORDER — SERTRALINE HCL 50 MG PO TABS
150.0000 mg | ORAL_TABLET | Freq: Every day | ORAL | Status: DC
  Administered 2016-04-28 – 2016-05-09 (×10): 150 mg via ORAL
  Filled 2016-04-28 (×10): qty 1

## 2016-04-28 MED ORDER — GABAPENTIN 400 MG PO CAPS
800.0000 mg | ORAL_CAPSULE | Freq: Three times a day (TID) | ORAL | Status: DC
  Administered 2016-04-28 – 2016-05-09 (×28): 800 mg via ORAL
  Filled 2016-04-28 (×28): qty 2

## 2016-04-28 MED ORDER — ONDANSETRON HCL 4 MG/2ML IJ SOLN
4.0000 mg | Freq: Four times a day (QID) | INTRAMUSCULAR | Status: DC | PRN
  Administered 2016-04-28: 4 mg via INTRAVENOUS
  Filled 2016-04-28: qty 2

## 2016-04-28 MED ORDER — ATORVASTATIN CALCIUM 40 MG PO TABS
40.0000 mg | ORAL_TABLET | Freq: Every day | ORAL | Status: DC
  Administered 2016-04-29 – 2016-05-08 (×9): 40 mg via ORAL
  Filled 2016-04-28 (×9): qty 1

## 2016-04-28 MED ORDER — IOPAMIDOL (ISOVUE-370) INJECTION 76%
INTRAVENOUS | Status: AC
  Filled 2016-04-28: qty 50

## 2016-04-28 MED ORDER — SODIUM CHLORIDE 0.9% FLUSH
3.0000 mL | Freq: Two times a day (BID) | INTRAVENOUS | Status: DC
  Administered 2016-04-29 – 2016-05-09 (×12): 3 mL via INTRAVENOUS

## 2016-04-28 MED ORDER — ALUM & MAG HYDROXIDE-SIMETH 200-200-20 MG/5ML PO SUSP: 30.0000 mL | Freq: Four times a day (QID) | ORAL | Status: DC | PRN

## 2016-04-28 MED ORDER — ONDANSETRON HCL 4 MG PO TABS
4.0000 mg | ORAL_TABLET | Freq: Four times a day (QID) | ORAL | Status: DC | PRN
  Administered 2016-04-30: 4 mg via ORAL
  Filled 2016-04-28: qty 1

## 2016-04-28 MED ORDER — METOPROLOL TARTRATE 25 MG PO TABS: 25.0000 mg | ORAL_TABLET | Freq: Two times a day (BID) | ORAL | Status: DC

## 2016-04-28 MED ORDER — ACETAMINOPHEN 325 MG PO TABS
650.0000 mg | ORAL_TABLET | Freq: Four times a day (QID) | ORAL | Status: DC | PRN
  Administered 2016-04-29 – 2016-05-05 (×10): 650 mg via ORAL
  Filled 2016-04-28 (×10): qty 2

## 2016-04-28 MED ORDER — ACETAMINOPHEN 650 MG RE SUPP: 650.0000 mg | Freq: Four times a day (QID) | RECTAL | Status: DC | PRN

## 2016-04-28 MED ORDER — GABAPENTIN 800 MG PO TABS
800.0000 mg | ORAL_TABLET | Freq: Three times a day (TID) | ORAL | Status: DC
  Filled 2016-04-28: qty 1

## 2016-04-28 MED ORDER — SENNOSIDES-DOCUSATE SODIUM 8.6-50 MG PO TABS: 1.0000 | ORAL_TABLET | Freq: Every evening | ORAL | Status: DC | PRN

## 2016-04-28 MED ORDER — BUPROPION HCL ER (SR) 150 MG PO TB12
450.0000 mg | ORAL_TABLET | Freq: Every day | ORAL | Status: DC
  Administered 2016-04-29 – 2016-05-09 (×9): 450 mg via ORAL
  Filled 2016-04-28 (×9): qty 3

## 2016-04-28 MED ORDER — SODIUM CHLORIDE 0.9 % IV SOLN
INTRAVENOUS | Status: DC
  Administered 2016-04-28: 23:00:00 via INTRAVENOUS

## 2016-04-28 NOTE — ED Notes (Signed)
Pt to go to CT before being transported upstairs.

## 2016-04-28 NOTE — ED Notes (Signed)
Admitting provider paged regarding pt troponin. 

## 2016-04-28 NOTE — ED Notes (Signed)
Attempted report x1. 
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
## 2016-04-28 NOTE — ED Notes (Signed)
Per Ct, IV not able to be used for imaging. Will transport pt upstairs.

## 2016-04-28 NOTE — ED Notes (Signed)
CBG was 136 nurse notified

## 2016-04-28 NOTE — ED Notes (Signed)
Patient transporting to CT

## 2016-04-28 NOTE — Progress Notes (Signed)
Received pt from ED. Tele on. VS taken.IVF not working. Iv team consult put on. Unable to start IVF . No iv access.

## 2016-04-28 NOTE — H&P (Signed)
History and Physical    Jorge DroughtGeorge O [name] ZOX:[id number] DOB: May 06, 1945 DOA: 04/28/2016   PCP: Lonie PeakNathan Conroy, PA-C   Patient coming from:  Home    Chief Complaint: Syncope   HPI: Jorge [name] is a 71 y.o. male with a history of paroxysmal atrial fibrillation on Eliquis , DCHF,  AS,  hypertension, hyperlipidemia, obey CT, prior history of stroke, carotid artery disease, brought to the ED after a syncopal episode. In review, she was shaving around 8:30 this morning, when he turned his head to the right, accompanied by some prodromal symptoms including nausea, and "shinning lights:, falling an hitting his head against the bathtub. He had lost consciousness for several minutes, but remained on the floor till 11 am, when he was found by his caretaker and EMS was called. Patient reported increased anxiety a the time, and complains of vague chest pain, without palpitations. He reports increasing shortness of breath without cough. He denies any jaw pain or left arm pain. He reports having similar dizzy spells over the last year but no presyncopal or syncopal events. No dysarthria or dysphagia. Denies any unilateral weakness or sensory deficiencies.  Denies any confusion. Of note, patient has trouble finding words since his last stroke. No seizures reported.  Denies any fever or chills, or night sweats. Denies any abdominal pain, or diarrhea. Denies any sick contacts or new foods. Denies any recent long distance trips. No recent surgeries. Denies worsening lower extremity swelling. Denies abnormal skin rashes, or neuropathy. Compliant with  medications. Does not smoke.   ED Course:  BP 110/59   Pulse 91   Temp 98.2 F (36.8 C) (Oral)   Resp (!) 30   SpO2 96%    sodium 138 potassium 3.7 creatinine 0.99 CK 148 troponin 0.01 white count 14.2 hemoglobin 13.7 platelets 306  D dimer 3.28 Glucose 132  Review of Systems: As per HPI otherwise 10 point review of systems negative.   Past Medical  History:  Diagnosis Date  . Chest pain   . CHF (congestive heart failure) (HCC)   . Edema   . HTN (hypertension)   . Hyperlipidemia   . Neuropathy (HCC)   . Obesity   . SOB (shortness of breath)   . Stroke [facility](HCC)     Past Surgical History:  Procedure Laterality Date  . BACK SURGERY    . CARDIAC CATHETERIZATION    . CAROTID ANGIOGRAM N/A 05/30/2014   Procedure: Dorise BullionERBRAL  ANGIOGRAM;  Surgeon: Nada LibmanVance W Brabham, MD;  [facility];  Service: Cardiovascular;  Laterality: N/A;    Social History Social History   Social History  . Marital status: Divorced    Spouse name: N/A  . Number of children: N/A  . Years of education: N/A   Occupational History  . Not on file.   Social History Main Topics  . Smoking status: Former Games developermoker  . Smokeless tobacco: Former NeurosurgeonUser    Quit date: 03/22/1996  . Alcohol use No  . Drug use: No  . Sexual activity: Not on file   Other Topics Concern  . Not on file   Social History Narrative  . No narrative on file     Allergies  Allergen Reactions  . Coumadin [Warfarin Sodium] Rash    No family history on file.    Prior to Admission medications   Medication Sig Start Date End Date Taking? Authorizing Provider  ALPRAZolam Prudy Feeler(XANAX) 0.5 MG tablet Take 0.5 mg by mouth at bedtime.  Yes Historical Provider, MD  apixaban (ELIQUIS) 5 MG TABS tablet Take 1 tablet (5 mg total) by mouth 2 (two) times daily. 05/31/14  Yes Lorenda Hatchet, MD  atorvastatin (LIPITOR) 40 MG tablet Take 1 tablet (40 mg total) by mouth daily at 6 PM. 05/31/14  Yes Lorenda Hatchet, MD  buPROPion [facility] SR) 150 MG 12 hr tablet Take 450 mg by mouth daily.    Yes Historical Provider, MD  furosemide (LASIX) 40 MG tablet Take 40 mg by mouth daily. 04/17/15  Yes Historical Provider, MD  gabapentin (NEURONTIN) 800 MG tablet Take 800 mg by mouth 3 (three) times daily.    Yes Historical Provider, MD  hydrOXYzine (ATARAX/VISTARIL) 25 MG tablet Take 25 mg by mouth 3 (three)  times daily as needed.   Yes Historical Provider, MD  metoprolol tartrate (LOPRESSOR) 25 MG tablet Take 0.5 tablets (12.5 mg total) by mouth 2 (two) times daily. Patient taking differently: Take 25 mg by mouth 2 (two) times daily.  05/31/14  Yes Lorenda Hatchet, MD  oxycodone (OXY-IR) 5 MG capsule Take 5 mg by mouth every 4 (four) hours as needed for pain.    Yes Historical Provider, MD  sertraline (ZOLOFT) 100 MG tablet Take 150 mg by mouth daily.    Yes Historical Provider, MD  traMADol (ULTRAM) 50 MG tablet Take 50 mg by mouth every 6 (six) hours as needed for moderate pain.    Yes Historical Provider, MD    Physical Exam:    Vitals:   04/28/16 1401 04/28/16 1556 04/28/16 1600 04/28/16 1630  BP: (!) 106/54  110/78 110/59  Pulse: 81  92 91  Resp: 18  (!) 30   Temp:  98.2 F (36.8 C)    TempSrc:  Oral    SpO2: 98%  94% 96%       Constitutional: very anxious appearing, uncomfortable, diaphoretic Vitals:   04/28/16 1401 04/28/16 1556 04/28/16 1600 04/28/16 1630  BP: (!) 106/54  110/78 110/59  Pulse: 81  92 91  Resp: 18  (!) 30   Temp:  98.2 F (36.8 C)    TempSrc:  Oral    SpO2: 98%  94% 96%   Eyes: PERRL, lids and conjunctivae normal. Right eye is of different color from left  ENMT: Mucous membranes are moist. Posterior pharynx clear of any exudate or lesions.Normal dentition.  Neck: normal, supple, no masses, no thyromegaly Respiratory: clear to auscultation bilaterally, no wheezing, no crackles. Normal respiratory effort. No accessory muscle use.  Cardiovascular: Regular rate and rhythm, high pitch RUSB murmurs / rubs / gallops. Trace bilateral lower  extremity edema. 2+ pedal pulses. No carotid bruits.  Abdomen:  Morbidly obese no tenderness, no masses palpated. No hepatosplenomegaly. Bowel sounds positive.  Musculoskeletal: no clubbing / cyanosis. No joint deformity upper and lower extremities. Good ROM, no contractures. Normal muscle tone.  Skin: no rashes, lesions,  ulcers. Skin is dry  Neurologic: CN 2-12 grossly intact. Sensation intact, DTR normal. Strength 5/5 in all 4.  Psychiatric: Normal judgment and insight. Alert and oriented x 3. Anxious     Labs on Admission: I have personally reviewed following labs and imaging studies  CBC:  Recent Labs Lab 04/28/16 1249  WBC 14.2*  HGB 13.7  HCT 42.8  MCV 94.5  PLT 306    Basic Metabolic Panel:  Recent Labs Lab 04/28/16 1209  NA 138  K 3.7  CL 103  CO2 26  GLUCOSE 132*  BUN 11  CREATININE  0.99  CALCIUM 8.9    GFR: CrCl cannot be calculated (Unknown ideal weight.).  Liver Function Tests:  Recent Labs Lab 04/28/16 1209  AST 21  ALT 17  ALKPHOS 219*  BILITOT 0.5  PROT 7.3  ALBUMIN 3.8   No results for input(s): LIPASE, AMYLASE in the last 168 hours. No results for input(s): AMMONIA in the last 168 hours.  Coagulation Profile: No results for input(s): INR, PROTIME in the last 168 hours.  Cardiac Enzymes:  Recent Labs Lab 04/28/16 1249  CKTOTAL 148    BNP (last 3 results) No results for input(s): PROBNP in the last 8760 hours.  HbA1C: No results for input(s): HGBA1C in the last 72 hours.  CBG:  Recent Labs Lab 04/28/16 1235  GLUCAP 136*    Lipid Profile: No results for input(s): CHOL, HDL, LDLCALC, TRIG, CHOLHDL, LDLDIRECT in the last 72 hours.  Thyroid Function Tests: No results for input(s): TSH, T4TOTAL, FREET4, T3FREE, THYROIDAB in the last 72 hours.  Anemia Panel: No results for input(s): VITAMINB12, FOLATE, FERRITIN, TIBC, IRON, RETICCTPCT in the last 72 hours.  Urine analysis:    Component Value Date/Time   COLORURINE YELLOW 04/28/2016 1451   APPEARANCEUR CLEAR 04/28/2016 1451   LABSPEC 1.011 04/28/2016 1451   PHURINE 7.0 04/28/2016 1451   GLUCOSEU NEGATIVE 04/28/2016 1451   HGBUR SMALL (A) 04/28/2016 1451   BILIRUBINUR NEGATIVE 04/28/2016 1451   KETONESUR NEGATIVE 04/28/2016 1451   PROTEINUR NEGATIVE 04/28/2016 1451   NITRITE  NEGATIVE 04/28/2016 1451   LEUKOCYTESUR NEGATIVE 04/28/2016 1451    Sepsis Labs: @LABRCNTIP (procalcitonin:4,lacticidven:4) )No results found for this or any previous visit (from the past 240 hour(s)).   Radiological Exams on Admission: Dg Chest 1 View  Result Date: 04/28/2016 CLINICAL DATA:  Syncopal episode while shaving this morning. LEFT arm pain. History of recent humerus fracture. EXAM: CHEST 1 VIEW COMPARISON:  Chest radiograph March 21, 2016 FINDINGS: Cardiac silhouette is upper limits of normal in size. Mediastinal silhouette is nonsuspicious, mildly calcified aortic knob. Persistently elevated RIGHT hemidiaphragm with bibasilar strandy densities. Rounded density projects in RIGHT lung apex. No pneumothorax. Soft tissue planes and included osseous structures are nonsuspicious. IMPRESSION: Rounded density projecting RIGHT lung apex could represent EEG lead sticker, less likely consolidation/pneumonia. Persistently elevated RIGHT hemidiaphragm with bibasilar atelectasis. Borderline cardiomegaly. Electronically Signed   By: Awilda Metro M.D.   On: 04/28/2016 13:29   Ct Head Wo Contrast  Result Date: 04/28/2016 CLINICAL DATA:  Trauma, fall while walking dog EXAM: CT HEAD WITHOUT CONTRAST CT CERVICAL SPINE WITHOUT CONTRAST TECHNIQUE: Multidetector CT imaging of the head and cervical spine was performed following the standard protocol without intravenous contrast. Multiplanar CT image reconstructions of the cervical spine were also generated. COMPARISON:  CT head dated 03/19/2016. CT head/ cervical spine dated 03/16/2016. FINDINGS: CT HEAD FINDINGS Brain: No evidence of acute infarction, hemorrhage, hydrocephalus, extra-axial collection or mass lesion/mass effect. Subcortical white matter and periventricular small vessel ischemic changes. Global cortical atrophy. Vascular: Mild intracranial atherosclerosis. Skull: Normal. Negative for fracture or focal lesion. Sinuses/Orbits: Mild partial  opacification of the right maxillary sinus. Visualized paranasal sinuses and mastoid air cells are otherwise clear. Other: None. CT CERVICAL SPINE FINDINGS Alignment: Normal cervical lordosis. Skull base and vertebrae: No acute fracture. No primary bone lesion or focal pathologic process. Soft tissues and spinal canal: No prevertebral fluid or swelling. No visible canal hematoma. Disc levels: Mild degenerative changes. Spinal canal remains patent. Upper chest: Visualized lung apices are essentially clear. Other: Visualized thyroid  is unremarkable. IMPRESSION: No evidence of acute intracranial abnormality. Atrophy with small vessel ischemic changes. No evidence of traumatic injury to the cervical spine. Mild degenerative changes. Electronically Signed   By: Charline Bills M.D.   On: 04/28/2016 13:16   Ct Cervical Spine Wo Contrast  Result Date: 04/28/2016 CLINICAL DATA:  Trauma, fall while walking dog EXAM: CT HEAD WITHOUT CONTRAST CT CERVICAL SPINE WITHOUT CONTRAST TECHNIQUE: Multidetector CT imaging of the head and cervical spine was performed following the standard protocol without intravenous contrast. Multiplanar CT image reconstructions of the cervical spine were also generated. COMPARISON:  CT head dated 03/19/2016. CT head/ cervical spine dated 03/16/2016. FINDINGS: CT HEAD FINDINGS Brain: No evidence of acute infarction, hemorrhage, hydrocephalus, extra-axial collection or mass lesion/mass effect. Subcortical white matter and periventricular small vessel ischemic changes. Global cortical atrophy. Vascular: Mild intracranial atherosclerosis. Skull: Normal. Negative for fracture or focal lesion. Sinuses/Orbits: Mild partial opacification of the right maxillary sinus. Visualized paranasal sinuses and mastoid air cells are otherwise clear. Other: None. CT CERVICAL SPINE FINDINGS Alignment: Normal cervical lordosis. Skull base and vertebrae: No acute fracture. No primary bone lesion or focal pathologic  process. Soft tissues and spinal canal: No prevertebral fluid or swelling. No visible canal hematoma. Disc levels: Mild degenerative changes. Spinal canal remains patent. Upper chest: Visualized lung apices are essentially clear. Other: Visualized thyroid is unremarkable. IMPRESSION: No evidence of acute intracranial abnormality. Atrophy with small vessel ischemic changes. No evidence of traumatic injury to the cervical spine. Mild degenerative changes. Electronically Signed   By: Charline Bills M.D.   On: 04/28/2016 13:16   Dg Humerus Left  Result Date: 04/28/2016 CLINICAL DATA:  Syncopal episode while shaving this morning. LEFT arm pain. History of recent humerus fracture. EXAM: LEFT HUMERUS - 2+ VIEW COMPARISON:  LEFT humerus fracture March 20, 2016 FINDINGS: Healing displaced proximal humerus fracture with callus, rarefaction of the fracture line. No new fracture deformity. No dislocation. Distal humerus in fiberglass cast. No destructive bony lesions. Soft tissue planes are nonsuspicious. IMPRESSION: Healing displaced proximal LEFT humerus fracture ; no new fracture deformity or dislocation. Electronically Signed   By: Awilda Metro M.D.   On: 04/28/2016 13:31    EKG: Independently reviewed.  Assessment/Plan Active Problems:   Syncope   Dyspnea   Aortic stenosis   History of CEA (carotid endarterectomy)   Chest pain   CAD (coronary artery disease)   History of tobacco abuse   HLD (hyperlipidemia)   CHF (congestive heart failure) (HCC)   Syncope of unknown etiology, cardiac versus infection versus P.E.  versus orthostatic  DDmer 3.28 EKG unrevealing. Tn 0.01. WBC 14  Neuro exam unremarkable No apparent seizure activity. CT head negative for acute intracranial abnormalities. Patient is very diaphoretic and complaining of non specific chest pain . Risk factors for PE include obesity, Afib on anticoagulation, prior stroke, HTN,. HLD, decreased level of activity. LAst 2 D echo vigorous  systolic function with EF 65-70 and Gr 1 DD  Syncope order set  admit for observation - Tele bed. IV fluids  serial EKG Serial troponin  CT angio chest to rule out PE  Check orthostatics when able to stand up  CBC in am   Hypertension BP106/54 Pulse 81   Controlled Continue home anti-hypertensive medications tomorrow  Paroxysmal Atrial Fibrillation CHA2DS2-VASc score 6 , on anticoagulation with Eliquis . EKG on SR   Rate controlled -Continue meds  Hyperlipidemia Continue home statins   Peripheral Neuropathy Continue Neurontin   Depression Continue  home Zoloft    DVT prophylaxis: Eliquis  Code Status:   Full     Family Communication:  Discussed with patient Disposition Plan: Expect patient to be discharged to home after condition improves Consults called:   Cardiology  Admission status:Tele  Obs     Marcos Eke, PA-C [facility]   04/28/2016, 5:11 PM

## 2016-04-28 NOTE — ED Provider Notes (Signed)
[facility]-EMER[facility]ENCY DEPT Provider Note   CSN: [id number] Arrival date & time: 04/28/16  1156     History   Chief Complaint Chief Complaint  Patient presents with  . Loss of Consciousness    HPI Jorge [name] is a 71 y.o. male.  Patient with history of ACS, stroke, moderate aortic stenosis, moderate aortic regurgitation, carotid stenosis, congestive heart failure (grade I diastolic dysfunction on ECHO 10/17), atrial fibrillation on Eliquis -- presents with complaint of syncopal episode. Episode occurred approximately 6:30 AM. Patient had just finished shaving and had a short prodrome of flashing lights. He states that he fell backwards into his bathtub. Patient thinks he lost consciousness but is unsure for how long. He struggled to get himself out of the bathtub but was able to after several hours. Approximately 11 AM, [employer] arrived at his house and found him and called EMS. Currently c/o occipital head pain, L upper arm pain (recent fracture and L UE is in cast). No significant neck pain. He reports CP prior to EMS arrival, resolved spontaneously. He was placed in a c-collar and given 324 mg of aspirin prior to arrival. Patient has had several recent falls. He has a baseline right-sided weakness from previous stroke that is unchanged. The onset of this condition was acute. The course is improving. Aggravating factors: none. Alleviating factors: none.        Past Medical History:  Diagnosis Date  . Chest pain   . CHF (congestive heart failure) (HCC)   . Edema   . HTN (hypertension)   . Hyperlipidemia   . Neuropathy (HCC)   . Obesity   . SOB (shortness of breath)   . Stroke [facility](HCC)     Patient Active Problem List   Diagnosis Date Noted  . Ileus (HCC) 05/08/2015  . Abdominal pain 05/08/2015  . Nausea and vomiting 05/08/2015  . HTN (hypertension)   . Hyperlipidemia   . CHF (congestive heart failure) (HCC)   . Paroxysmal atrial fibrillation (HCC) 05/29/2014  .  Hemispheric carotid artery syndrome   . Internal carotid artery stenosis   . CAD (coronary artery disease) 05/25/2014  . History of tobacco abuse 05/25/2014  . TIA (transient ischemic attack) 05/25/2014  . HLD (hyperlipidemia)   . Essential hypertension   . Stroke-like symptoms 05/24/2014  . Dyspnea 03/22/2012  . Aortic stenosis 03/22/2012  . Acute edema of lung, unspecified 03/22/2012  . History of CEA (carotid endarterectomy) 03/22/2012  . Chest pain 03/22/2012    Past Surgical History:  Procedure Laterality Date  . BACK SURGERY    . CARDIAC CATHETERIZATION    . CAROTID ANGIOGRAM N/A 05/30/2014   Procedure: Dorise BullionERBRAL  ANGIOGRAM;  Surgeon: Nada LibmanVance W Brabham, MD;  [facility];  Service: Cardiovascular;  Laterality: N/A;       Home Medications    Prior to Admission medications   Medication Sig Start Date End Date Taking? Authorizing Provider  acetaminophen (TYLENOL) 325 MG tablet Take 2 tablets (650 mg total) by mouth every 6 (six) hours as needed for mild pain (or Fever >/= 101). 05/10/15   Jeralyn BennettEzequiel Zamora, MD  ALPRAZolam Prudy Feeler(XANAX) 0.5 MG tablet Take 0.5 mg by mouth at bedtime as needed for sleep.     Historical Provider, MD  apixaban (ELIQUIS) 5 MG TABS tablet Take 1 tablet (5 mg total) by mouth 2 (two) times daily. 05/31/14   Lorenda HatchetAdam L Rothman, MD  atorvastatin (LIPITOR) 40 MG tablet Take 1 tablet (40 mg total) by mouth  daily at 6 PM. 05/31/14   Lorenda Hatchet, MD  buPROPion [facility] SR) 150 MG 12 hr tablet Take 150 mg by mouth daily.    Historical Provider, MD  furosemide (LASIX) 40 MG tablet Take 40 mg by mouth daily. 04/17/15   Historical Provider, MD  gabapentin (NEURONTIN) 800 MG tablet Take 800 mg by mouth 2 (two) times daily.    Historical Provider, MD  metoprolol tartrate (LOPRESSOR) 25 MG tablet Take 0.5 tablets (12.5 mg total) by mouth 2 (two) times daily. 05/31/14   Lorenda Hatchet, MD  sertraline (ZOLOFT) 100 MG tablet Take 100 mg by mouth daily.    Historical  Provider, MD    Family History No family history on file.  Social History Social History  Substance Use Topics  . Smoking status: Former Games developer  . Smokeless tobacco: Former Neurosurgeon    Quit date: 03/22/1996  . Alcohol use No     Allergies   Coumadin [warfarin sodium]   Review of Systems Review of Systems  Constitutional: Negative for diaphoresis and fever.  Eyes: Negative for redness.  Respiratory: Negative for cough and shortness of breath.   Cardiovascular: Positive for chest pain. Negative for palpitations and leg swelling.  Gastrointestinal: Negative for abdominal pain, nausea and vomiting.  Genitourinary: Negative for dysuria.  Musculoskeletal: Positive for arthralgias and myalgias. Negative for back pain and neck pain.  Skin: Negative for rash.  Neurological: Positive for syncope and headaches. Negative for light-headedness.  Psychiatric/Behavioral: The patient is not nervous/anxious.      Physical Exam Updated Vital Signs BP 142/63 (BP Location: Right Arm)   Pulse 90   Temp 98.7 F (37.1 C) (Oral)   Resp 17   SpO2 96%   Physical Exam  Constitutional: He is oriented to person, place, and time. He appears well-developed and well-nourished.  HENT:  Head: Normocephalic and atraumatic. Head is without raccoon's eyes and without Battle's sign.  Right Ear: Tympanic membrane, external ear and ear canal normal. No hemotympanum.  Left Ear: Tympanic membrane, external ear and ear canal normal. No hemotympanum.  Nose: Nose normal. No nasal septal hematoma.  Mouth/Throat: Oropharynx is clear and moist.  Left pupil: Irregular, history of previous surgery  Right pupil: Reactive to light, cataract visible  Eyes: Conjunctivae, EOM and lids are normal. Pupils are equal, round, and reactive to light. Right eye exhibits no discharge. Left eye exhibits no discharge.  No visible hyphema  Neck: Normal range of motion. Neck supple.  Cardiovascular: Normal rate and regular rhythm.    Exam is limited 2/2 habitus and noise being in the hallway.   Pulmonary/Chest: Effort normal and breath sounds normal. No respiratory distress. He has no wheezes. He has no rales.  Abdominal: Soft. There is no tenderness.  Musculoskeletal: Normal range of motion.       Cervical back: He exhibits normal range of motion, no tenderness and no bony tenderness.       Thoracic back: He exhibits no tenderness and no bony tenderness.       Lumbar back: He exhibits no tenderness and no bony tenderness.  Neurological: He is alert and oriented to person, place, and time. He has normal strength and normal reflexes. No cranial nerve deficit or sensory deficit. Coordination normal. GCS eye subscore is 4. GCS verbal subscore is 5. GCS motor subscore is 6.  Right lower extremity slightly weak, patient reports baseline.  Skin: Skin is warm and dry.  Psychiatric: He has a normal mood and affect.  Nursing note and vitals reviewed.    ED Treatments / Results  Labs (all labs ordered are listed, but only abnormal results are displayed) Labs Reviewed  CBC - Abnormal; Notable for the following:       Result Value   WBC 14.2 (*)    All other components within normal limits  URINALYSIS, ROUTINE W REFLEX MICROSCOPIC - Abnormal; Notable for the following:    Hgb urine dipstick SMALL (*)    Squamous Epithelial / LPF 0-5 (*)    All other components within normal limits  COMPREHENSIVE METABOLIC PANEL - Abnormal; Notable for the following:    Glucose, Bld 132 (*)    Alkaline Phosphatase 219 (*)    All other components within normal limits  D-DIMER, QUANTITATIVE (NOT AT [facility]) - Abnormal; Notable for the following:    D-Dimer, Quant 3.28 (*)    All other components within normal limits  CBG MONITORING, ED - Abnormal; Notable for the following:    Glucose-Capillary 136 (*)    All other components within normal limits  CK  I-STAT TROPOININ, ED    EKG  EKG Interpretation  Date/Time:  Monday April 28 2016  12:45:22 EST Ventricular Rate:  83 PR Interval:  196 QRS Duration: 120 QT Interval:  406 QTC Calculation: 477 R Axis:   -40 Text Interpretation:  Normal sinus rhythm Left axis deviation Incomplete left bundle branch block Abnormal ECG No significant change since last tracing Confirmed by [facility] MD, Denny Peon ([id number]) on 04/28/2016 3:10:49 PM      ED ECG REPORT   Date: 04/28/2016  Rate: 83  Rhythm: normal sinus rhythm  QRS Axis: left  Intervals: normal  ST/T Wave abnormalities: nonspecific ST changes  Conduction Disutrbances:left bundle branch block  Narrative Interpretation:   Old EKG Reviewed: changes noted from 2/17, QRS slightly wider  I have personally reviewed the EKG tracing and agree with the computerized printout as noted.  Radiology Dg Chest 1 View  Result Date: 04/28/2016 CLINICAL DATA:  Syncopal episode while shaving this morning. LEFT arm pain. History of recent humerus fracture. EXAM: CHEST 1 VIEW COMPARISON:  Chest radiograph March 21, 2016 FINDINGS: Cardiac silhouette is upper limits of normal in size. Mediastinal silhouette is nonsuspicious, mildly calcified aortic knob. Persistently elevated RIGHT hemidiaphragm with bibasilar strandy densities. Rounded density projects in RIGHT lung apex. No pneumothorax. Soft tissue planes and included osseous structures are nonsuspicious. IMPRESSION: Rounded density projecting RIGHT lung apex could represent EEG lead sticker, less likely consolidation/pneumonia. Persistently elevated RIGHT hemidiaphragm with bibasilar atelectasis. Borderline cardiomegaly. Electronically Signed   By: Awilda Metro M.D.   On: 04/28/2016 13:29   Ct Head Wo Contrast  Result Date: 04/28/2016 CLINICAL DATA:  Trauma, fall while walking dog EXAM: CT HEAD WITHOUT CONTRAST CT CERVICAL SPINE WITHOUT CONTRAST TECHNIQUE: Multidetector CT imaging of the head and cervical spine was performed following the standard protocol without intravenous contrast.  Multiplanar CT image reconstructions of the cervical spine were also generated. COMPARISON:  CT head dated 03/19/2016. CT head/ cervical spine dated 03/16/2016. FINDINGS: CT HEAD FINDINGS Brain: No evidence of acute infarction, hemorrhage, hydrocephalus, extra-axial collection or mass lesion/mass effect. Subcortical white matter and periventricular small vessel ischemic changes. Global cortical atrophy. Vascular: Mild intracranial atherosclerosis. Skull: Normal. Negative for fracture or focal lesion. Sinuses/Orbits: Mild partial opacification of the right maxillary sinus. Visualized paranasal sinuses and mastoid air cells are otherwise clear. Other: None. CT CERVICAL SPINE FINDINGS Alignment: Normal cervical lordosis. Skull base and vertebrae: No acute fracture. No  primary bone lesion or focal pathologic process. Soft tissues and spinal canal: No prevertebral fluid or swelling. No visible canal hematoma. Disc levels: Mild degenerative changes. Spinal canal remains patent. Upper chest: Visualized lung apices are essentially clear. Other: Visualized thyroid is unremarkable. IMPRESSION: No evidence of acute intracranial abnormality. Atrophy with small vessel ischemic changes. No evidence of traumatic injury to the cervical spine. Mild degenerative changes. Electronically Signed   By: Charline Bills M.D.   On: 04/28/2016 13:16   Ct Cervical Spine Wo Contrast  Result Date: 04/28/2016 CLINICAL DATA:  Trauma, fall while walking dog EXAM: CT HEAD WITHOUT CONTRAST CT CERVICAL SPINE WITHOUT CONTRAST TECHNIQUE: Multidetector CT imaging of the head and cervical spine was performed following the standard protocol without intravenous contrast. Multiplanar CT image reconstructions of the cervical spine were also generated. COMPARISON:  CT head dated 03/19/2016. CT head/ cervical spine dated 03/16/2016. FINDINGS: CT HEAD FINDINGS Brain: No evidence of acute infarction, hemorrhage, hydrocephalus, extra-axial collection or  mass lesion/mass effect. Subcortical white matter and periventricular small vessel ischemic changes. Global cortical atrophy. Vascular: Mild intracranial atherosclerosis. Skull: Normal. Negative for fracture or focal lesion. Sinuses/Orbits: Mild partial opacification of the right maxillary sinus. Visualized paranasal sinuses and mastoid air cells are otherwise clear. Other: None. CT CERVICAL SPINE FINDINGS Alignment: Normal cervical lordosis. Skull base and vertebrae: No acute fracture. No primary bone lesion or focal pathologic process. Soft tissues and spinal canal: No prevertebral fluid or swelling. No visible canal hematoma. Disc levels: Mild degenerative changes. Spinal canal remains patent. Upper chest: Visualized lung apices are essentially clear. Other: Visualized thyroid is unremarkable. IMPRESSION: No evidence of acute intracranial abnormality. Atrophy with small vessel ischemic changes. No evidence of traumatic injury to the cervical spine. Mild degenerative changes. Electronically Signed   By: Charline Bills M.D.   On: 04/28/2016 13:16   Dg Humerus Left  Result Date: 04/28/2016 CLINICAL DATA:  Syncopal episode while shaving this morning. LEFT arm pain. History of recent humerus fracture. EXAM: LEFT HUMERUS - 2+ VIEW COMPARISON:  LEFT humerus fracture March 20, 2016 FINDINGS: Healing displaced proximal humerus fracture with callus, rarefaction of the fracture line. No new fracture deformity. No dislocation. Distal humerus in fiberglass cast. No destructive bony lesions. Soft tissue planes are nonsuspicious. IMPRESSION: Healing displaced proximal LEFT humerus fracture ; no new fracture deformity or dislocation. Electronically Signed   By: Awilda Metro M.D.   On: 04/28/2016 13:31    Procedures Procedures (including critical care time)  Medications Ordered in ED Medications - No data to display   Initial Impression / Assessment and Plan / ED Course  I have reviewed the triage vital  signs and the nursing notes.  Pertinent labs & imaging results that were available during my care of the patient were reviewed by me and considered in my medical decision making (see chart for details).     Patient seen and examined. Work-up initiated.   Vital signs reviewed and are as follows: BP 142/63 (BP Location: Right Arm)   Pulse 90   Temp 98.7 F (37.1 C) (Oral)   Resp 17   SpO2 96%   4:03 PM Pt discussed and seen by Dr. Dalene Seltzer. Will need admit for syncope and several risk factors. D-dimer added.   4:25 PM Spoke with [facility] who will see. Will ask cardiology for consult.   4:33 PM Spoke with cardiology and requested consult. They will see tomorrow morning.   4:52 PM Discussed elevated d-dimer with Dr. Konrad Dolores. CTPA ordered.  Final Clinical Impressions(s) / ED Diagnoses   Final diagnoses:  Syncope, unspecified syncope type  Chest pain, unspecified type   Admit. Syncope and CP with multiple risk factors.    New Prescriptions New Prescriptions   No medications on file     Renne Crigler, PA-C 04/28/16 [address] PA-C 04/28/16 1653    Alvira Monday, MD 05/06/16 [phone]

## 2016-04-28 NOTE — ED Notes (Signed)
Patient transported to CT 

## 2016-04-28 NOTE — ED Notes (Signed)
Spoke with lab about the D-dimer  . They stated that they have a blue top and can run the lab

## 2016-04-29 ENCOUNTER — Observation Stay (HOSPITAL_BASED_OUTPATIENT_CLINIC_OR_DEPARTMENT_OTHER): Payer: Medicare Other

## 2016-04-29 ENCOUNTER — Encounter (HOSPITAL_COMMUNITY): Payer: Self-pay | Admitting: Cardiology

## 2016-04-29 ENCOUNTER — Observation Stay (HOSPITAL_COMMUNITY): Payer: Medicare Other

## 2016-04-29 DIAGNOSIS — R079 Chest pain, unspecified: Secondary | ICD-10-CM | POA: Diagnosis not present

## 2016-04-29 DIAGNOSIS — I7771 Dissection of carotid artery: Secondary | ICD-10-CM | POA: Diagnosis present

## 2016-04-29 DIAGNOSIS — E785 Hyperlipidemia, unspecified: Secondary | ICD-10-CM | POA: Diagnosis present

## 2016-04-29 DIAGNOSIS — I959 Hypotension, unspecified: Secondary | ICD-10-CM

## 2016-04-29 DIAGNOSIS — I214 Non-ST elevation (NSTEMI) myocardial infarction: Secondary | ICD-10-CM | POA: Diagnosis not present

## 2016-04-29 DIAGNOSIS — I5032 Chronic diastolic (congestive) heart failure: Secondary | ICD-10-CM | POA: Diagnosis present

## 2016-04-29 DIAGNOSIS — F329 Major depressive disorder, single episode, unspecified: Secondary | ICD-10-CM | POA: Diagnosis present

## 2016-04-29 DIAGNOSIS — R55 Syncope and collapse: Secondary | ICD-10-CM

## 2016-04-29 DIAGNOSIS — K5900 Constipation, unspecified: Secondary | ICD-10-CM | POA: Diagnosis not present

## 2016-04-29 DIAGNOSIS — I10 Essential (primary) hypertension: Secondary | ICD-10-CM | POA: Diagnosis not present

## 2016-04-29 DIAGNOSIS — I352 Nonrheumatic aortic (valve) stenosis with insufficiency: Secondary | ICD-10-CM | POA: Diagnosis present

## 2016-04-29 DIAGNOSIS — F419 Anxiety disorder, unspecified: Secondary | ICD-10-CM | POA: Diagnosis present

## 2016-04-29 DIAGNOSIS — R0600 Dyspnea, unspecified: Secondary | ICD-10-CM | POA: Diagnosis not present

## 2016-04-29 DIAGNOSIS — R7989 Other specified abnormal findings of blood chemistry: Secondary | ICD-10-CM

## 2016-04-29 DIAGNOSIS — I700 Atherosclerosis of aorta: Secondary | ICD-10-CM | POA: Diagnosis present

## 2016-04-29 DIAGNOSIS — Z006 Encounter for examination for normal comparison and control in clinical research program: Secondary | ICD-10-CM | POA: Diagnosis not present

## 2016-04-29 DIAGNOSIS — I2510 Atherosclerotic heart disease of native coronary artery without angina pectoris: Secondary | ICD-10-CM | POA: Diagnosis not present

## 2016-04-29 DIAGNOSIS — W19XXXA Unspecified fall, initial encounter: Secondary | ICD-10-CM | POA: Diagnosis not present

## 2016-04-29 DIAGNOSIS — I708 Atherosclerosis of other arteries: Secondary | ICD-10-CM | POA: Diagnosis present

## 2016-04-29 DIAGNOSIS — Z6841 Body Mass Index (BMI) 40.0 and over, adult: Secondary | ICD-10-CM | POA: Diagnosis not present

## 2016-04-29 DIAGNOSIS — Z79899 Other long term (current) drug therapy: Secondary | ICD-10-CM | POA: Diagnosis not present

## 2016-04-29 DIAGNOSIS — Z954 Presence of other heart-valve replacement: Secondary | ICD-10-CM | POA: Diagnosis not present

## 2016-04-29 DIAGNOSIS — G4733 Obstructive sleep apnea (adult) (pediatric): Secondary | ICD-10-CM | POA: Diagnosis present

## 2016-04-29 DIAGNOSIS — Z87891 Personal history of nicotine dependence: Secondary | ICD-10-CM | POA: Diagnosis not present

## 2016-04-29 DIAGNOSIS — I6523 Occlusion and stenosis of bilateral carotid arteries: Secondary | ICD-10-CM | POA: Diagnosis present

## 2016-04-29 DIAGNOSIS — Z8673 Personal history of transient ischemic attack (TIA), and cerebral infarction without residual deficits: Secondary | ICD-10-CM | POA: Diagnosis not present

## 2016-04-29 DIAGNOSIS — I480 Paroxysmal atrial fibrillation: Secondary | ICD-10-CM | POA: Diagnosis present

## 2016-04-29 DIAGNOSIS — Z01818 Encounter for other preprocedural examination: Secondary | ICD-10-CM | POA: Diagnosis not present

## 2016-04-29 DIAGNOSIS — I350 Nonrheumatic aortic (valve) stenosis: Secondary | ICD-10-CM | POA: Diagnosis not present

## 2016-04-29 DIAGNOSIS — G629 Polyneuropathy, unspecified: Secondary | ICD-10-CM | POA: Diagnosis present

## 2016-04-29 DIAGNOSIS — Z952 Presence of prosthetic heart valve: Secondary | ICD-10-CM | POA: Diagnosis not present

## 2016-04-29 DIAGNOSIS — S42202A Unspecified fracture of upper end of left humerus, initial encounter for closed fracture: Secondary | ICD-10-CM | POA: Diagnosis present

## 2016-04-29 DIAGNOSIS — I110 Hypertensive heart disease with heart failure: Secondary | ICD-10-CM | POA: Diagnosis present

## 2016-04-29 DIAGNOSIS — K029 Dental caries, unspecified: Secondary | ICD-10-CM | POA: Diagnosis present

## 2016-04-29 DIAGNOSIS — I6522 Occlusion and stenosis of left carotid artery: Secondary | ICD-10-CM

## 2016-04-29 DIAGNOSIS — K0530 Chronic periodontitis, unspecified: Secondary | ICD-10-CM | POA: Diagnosis present

## 2016-04-29 DIAGNOSIS — I951 Orthostatic hypotension: Secondary | ICD-10-CM

## 2016-04-29 DIAGNOSIS — Z7901 Long term (current) use of anticoagulants: Secondary | ICD-10-CM | POA: Diagnosis not present

## 2016-04-29 DIAGNOSIS — W182XXA Fall in (into) shower or empty bathtub, initial encounter: Secondary | ICD-10-CM | POA: Diagnosis present

## 2016-04-29 DIAGNOSIS — Z9889 Other specified postprocedural states: Secondary | ICD-10-CM | POA: Diagnosis not present

## 2016-04-29 LAB — CBC
HCT: 40.1 % (ref 39.0–52.0)
HEMOGLOBIN: 12.8 g/dL — AB (ref 13.0–17.0)
MCH: 30.3 pg (ref 26.0–34.0)
MCHC: 31.9 g/dL (ref 30.0–36.0)
MCV: 95 fL (ref 78.0–100.0)
Platelets: 309 10*3/uL (ref 150–400)
RBC: 4.22 MIL/uL (ref 4.22–5.81)
RDW: 15.2 % (ref 11.5–15.5)
WBC: 9.7 10*3/uL (ref 4.0–10.5)

## 2016-04-29 LAB — APTT
APTT: 55 s — AB (ref 24–36)
APTT: 41 s — AB (ref 24–36)

## 2016-04-29 LAB — GLUCOSE, CAPILLARY: Glucose-Capillary: 121 mg/dL — ABNORMAL HIGH (ref 65–99)

## 2016-04-29 LAB — BASIC METABOLIC PANEL
Anion gap: 7 (ref 5–15)
BUN: 11 mg/dL (ref 6–20)
CO2: 24 mmol/L (ref 22–32)
Calcium: 8.2 mg/dL — ABNORMAL LOW (ref 8.9–10.3)
Chloride: 107 mmol/L (ref 101–111)
Creatinine, Ser: 1.01 mg/dL (ref 0.61–1.24)
GFR calc Af Amer: >60 mL/min (ref >60)
GFR calc non Af Amer: >60 mL/min (ref >60)
Glucose, Bld: 141 mg/dL — ABNORMAL HIGH (ref 65–99)
Potassium: 3.7 mmol/L (ref 3.5–5.1)
Sodium: 138 mmol/L (ref 135–145)

## 2016-04-29 LAB — URINALYSIS, ROUTINE W REFLEX MICROSCOPIC
Bacteria, UA: NONE SEEN
Bilirubin Urine: NEGATIVE
Glucose, UA: NEGATIVE mg/dL
Ketones, ur: NEGATIVE mg/dL
Leukocytes, UA: NEGATIVE
Nitrite: NEGATIVE
Protein, ur: NEGATIVE mg/dL
Specific Gravity, Urine: 1.021 (ref 1.005–1.030)
Squamous Epithelial / LPF: NONE SEEN
pH: 6 (ref 5.0–8.0)

## 2016-04-29 LAB — BRAIN NATRIURETIC PEPTIDE: B NATRIURETIC PEPTIDE 5: 54.4 pg/mL (ref 0.0–100.0)

## 2016-04-29 LAB — TROPONIN I
TROPONIN I: 0.04 ng/mL — AB (ref <0.03)
Troponin I: 0.15 ng/mL (ref <0.03)
Troponin I: <0.03 ng/mL (ref <0.03)

## 2016-04-29 LAB — LACTIC ACID, PLASMA
LACTIC ACID, VENOUS: 1.3 mmol/L (ref 0.5–1.9)
Lactic Acid, Venous: 2.2 mmol/L (ref 0.5–1.9)

## 2016-04-29 LAB — CORTISOL-AM, BLOOD: Cortisol - AM: 13.4 ug/dL (ref 6.7–22.6)

## 2016-04-29 LAB — ECHOCARDIOGRAM COMPLETE
Height: 74 in
Weight: 2326.4 oz

## 2016-04-29 LAB — HEPARIN LEVEL (UNFRACTIONATED): Heparin Unfractionated: 0.73 IU/mL — ABNORMAL HIGH (ref 0.30–0.70)

## 2016-04-29 MED ORDER — FUROSEMIDE 40 MG PO TABS: 40.0000 mg | ORAL_TABLET | Freq: Every day | ORAL | Status: DC

## 2016-04-29 MED ORDER — HEPARIN (PORCINE) IN NACL 100-0.45 UNIT/ML-% IJ SOLN
2000.0000 [IU]/h | INTRAMUSCULAR | Status: DC
  Administered 2016-04-29: 750 [IU]/h via INTRAVENOUS
  Administered 2016-04-30: 1600 [IU]/h via INTRAVENOUS
  Filled 2016-04-29 (×2): qty 250

## 2016-04-29 MED ORDER — PERFLUTREN LIPID MICROSPHERE
1.0000 mL | INTRAVENOUS | Status: AC | PRN
  Administered 2016-04-29: 3 mL via INTRAVENOUS
  Filled 2016-04-29: qty 10

## 2016-04-29 MED ORDER — VANCOMYCIN HCL IN DEXTROSE 750-5 MG/150ML-% IV SOLN
750.0000 mg | Freq: Two times a day (BID) | INTRAVENOUS | Status: DC
  Administered 2016-04-29 – 2016-04-30 (×3): 750 mg via INTRAVENOUS
  Filled 2016-04-29 (×4): qty 150

## 2016-04-29 MED ORDER — SODIUM CHLORIDE 0.9 % IV BOLUS (SEPSIS)
500.0000 mL | Freq: Once | INTRAVENOUS | Status: AC
  Administered 2016-04-29: 500 mL via INTRAVENOUS

## 2016-04-29 MED ORDER — DEXTROSE 5 % IV SOLN
2.0000 g | Freq: Two times a day (BID) | INTRAVENOUS | Status: DC
  Administered 2016-04-29 – 2016-05-01 (×5): 2 g via INTRAVENOUS
  Filled 2016-04-29 (×5): qty 2

## 2016-04-29 MED ORDER — METOPROLOL TARTRATE 25 MG PO TABS: 25.0000 mg | ORAL_TABLET | Freq: Two times a day (BID) | ORAL | Status: DC

## 2016-04-29 MED ORDER — SODIUM CHLORIDE 0.9 % IV SOLN
INTRAVENOUS | Status: DC
  Administered 2016-04-30: 06:00:00 via INTRAVENOUS

## 2016-04-29 MED ORDER — IOPAMIDOL (ISOVUE-370) INJECTION 76%
INTRAVENOUS | Status: AC
  Filled 2016-04-29: qty 100

## 2016-04-29 MED ORDER — HEPARIN BOLUS VIA INFUSION
3000.0000 [IU] | Freq: Once | INTRAVENOUS | Status: AC
  Administered 2016-04-29: 3000 [IU] via INTRAVENOUS
  Filled 2016-04-29: qty 3000

## 2016-04-29 MED ORDER — VANCOMYCIN HCL IN DEXTROSE 1-5 GM/200ML-% IV SOLN
1000.0000 mg | Freq: Once | INTRAVENOUS | Status: AC
  Administered 2016-04-29: 1000 mg via INTRAVENOUS
  Filled 2016-04-29: qty 200

## 2016-04-29 MED ORDER — ASPIRIN 81 MG PO CHEW
81.0000 mg | CHEWABLE_TABLET | ORAL | Status: AC
  Administered 2016-04-30: 81 mg via ORAL
  Filled 2016-04-29: qty 1

## 2016-04-29 MED ORDER — IOPAMIDOL (ISOVUE-370) INJECTION 76%
INTRAVENOUS | Status: AC
  Administered 2016-04-29: 110 mL via INTRAVENOUS
  Filled 2016-04-29: qty 50

## 2016-04-29 MED ORDER — HEPARIN BOLUS VIA INFUSION
4000.0000 [IU] | Freq: Once | INTRAVENOUS | Status: AC
  Administered 2016-04-29: 4000 [IU] via INTRAVENOUS
  Filled 2016-04-29: qty 4000

## 2016-04-29 NOTE — Evaluation (Signed)
Clinical/Bedside Swallow Evaluation Patient Details  Name: Jorge DroughtGeorge O Hanway MRN: [id number] Date of Birth: 03/22/1946  Today's Date: 04/29/2016 Time: SLP Start Time (ACUTE ONLY): 0945 SLP Stop Time (ACUTE ONLY): 1005 SLP Time Calculation (min) (ACUTE ONLY): 20 min  Past Medical History:  Past Medical History:  Diagnosis Date  . Chest pain   . CHF (congestive heart failure) (HCC)   . Edema   . HTN (hypertension)   . Hyperlipidemia   . Neuropathy (HCC)   . Obesity   . SOB (shortness of breath)   . Stroke [facility](HCC)    Past Surgical History:  Past Surgical History:  Procedure Laterality Date  . BACK SURGERY    . CARDIAC CATHETERIZATION    . CAROTID ANGIOGRAM N/A 05/30/2014   Procedure: Dorise BullionERBRAL  ANGIOGRAM;  Surgeon: Nada LibmanVance W Brabham, MD;  [facility];  Service: Cardiovascular;  Laterality: N/A;   HPI:  [name] was shaving his face in his bathroom yesterday morning and became light headed and remembers seeing a bright lights and then "went out". He does not remember falling. He did hit his head on the bathtub. Head CT was negative for bleed. His D dimer was elevated at 3.28, chest CT negative for PE. He recently had a syncopal episode about a month ago and he broke his left arm. [facility]. He tells me that he did do what sounds like a Lexiscan Myoview but all he remembers is that his blood pressure dropped during the test. He did not have a heart cath. Today, he became hypotensive, with SBP's in the 70-80's. EKG was done and is essentially unchanged from prior EKG's. His last Echo was in Nov. 2017, his LVEF was normal, but he did have moderate AS with peak gradient of 49 mm Hg and mean velocity of 243 cm/s. Current imaging didn't reveal any evidence of acute intracranial abnormalities. Of note, pt received ST services on 05/31/14 for cognition therapy following hospitalization for left sided weakness. Pt discharged from services prior to discharging  home. No history of dysphagia.    Assessment / Plan / Recommendation Clinical Impression  Pt appears at reduced risk for aspiration following general aspiration precautions. Pt consumed current diet without overt s/s of aspiration. Of note, pt's ability to express himself and retain information appears to be functional as well. No further skilled ST is indicated at this time. ST to sign off.     Aspiration Risk  Mild aspiration risk    Diet Recommendation Regular;Thin liquid   Liquid Administration via: Straw Medication Administration: Whole meds with liquid Supervision: Patient able to self feed Postural Changes: Seated upright at 90 degrees    Other  Recommendations Oral Care Recommendations: Oral care BID   Follow up Recommendations None       Swallow Study   General Date of Onset: 04/28/16 HPI: [name] was shaving his face in his bathroom yesterday morning and became light headed and remembers seeing a bright lights and then "went out". He does not remember falling. He did hit his head on the bathtub. Head CT was negative for bleed. His D dimer was elevated at 3.28, chest CT negative for PE. He recently had a syncopal episode about a month ago and he broke his left arm. [facility]. He tells me that he did do what sounds like a Lexiscan Myoview but all he remembers is that his blood pressure dropped  during the test. He did not have a heart cath. Today, he became hypotensive, with SBP's in the 70-80's. EKG was done and is essentially unchanged from prior EKG's. His last Echo was in Nov. 2017, his LVEF was normal, but he did have moderate AS with peak gradient of 49 mm Hg and mean velocity of 243 cm/s. Current imaging didn't reveal any evidence of acute intracranial abnormalities. Of note, pt received ST services on 05/31/14 for cognition therapy following hospitalization for left sided weakness. Pt discharged from services prior to discharging home.  No history of dysphagia.  Type of Study: Bedside Swallow Evaluation Previous Swallow Assessment:  (None in chart) Diet Prior to this Study: Regular;Thin liquids Temperature Spikes Noted: Yes Respiratory Status: Room air History of Recent Intubation: No Behavior/Cognition: Alert;Cooperative;Pleasant mood Oral Cavity Assessment: Within Functional Limits Oral Care Completed by SLP: No Oral Cavity - Dentition: Adequate natural dentition Vision: Functional for self-feeding Self-Feeding Abilities: Able to feed self Patient Positioning: Upright in bed Baseline Vocal Quality: Normal Volitional Cough: Strong Volitional Swallow: Able to elicit    Oral/Motor/Sensory Function Overall Oral Motor/Sensory Function: Within functional limits   Ice Chips Ice chips: Not tested   Thin Liquid Thin Liquid: Within functional limits Presentation: Straw    Nectar Thick Nectar Thick Liquid: Not tested   Honey Thick Honey Thick Liquid: Not tested   Puree Puree: Not tested   Solid   GO   Solid: Within functional limits Presentation: Self Fed    Functional Assessment Tool Used: Skilled clinical observation Functional Limitations: Swallowing Swallow Current Status ([id number]): At least 1 percent but less than 20 percent impaired, limited or restricted Swallow Goal Status [phone]): At least 1 percent but less than 20 percent impaired, limited or restricted Swallow Discharge Status [phone]): At least 1 percent but less than 20 percent impaired, limited or restricted   Natoria Archibald B. Dreama Saa M.S., CCC-SLP Speech-Language Pathologist  Janos Shampine 04/29/2016,10:14 AM

## 2016-04-29 NOTE — Progress Notes (Signed)
Notified MD of latest BP 95/55 after 2nd bolus and lactic acid of 2.2.

## 2016-04-29 NOTE — Progress Notes (Signed)
PT Cancellation Note  Patient Details Name: Jorge [name] MRN: [id number] DOB: 04-Apr-1946   Cancelled Treatment:    Reason Eval/Treat Not Completed: Medical issues which prohibited therapy, BP issues at this time, will try again this pm   Fabio AsaDevon J Tatyana Biber 04/29/2016, 11:53 AM

## 2016-04-29 NOTE — Progress Notes (Signed)
RN was just informed of available stepdown bed for patient.  Notified MD of bed availability, as well as last vitals.  As patient has improved and VSS at this time patient will remain on floor.  Nursing staff will continue to monitor and notify MD immediately of any changes.

## 2016-04-29 NOTE — Progress Notes (Signed)
Echocardiogram 2D Echocardiogram has been performed.  Jorge [name] 04/29/2016, 1:44 PM

## 2016-04-29 NOTE — Consult Note (Signed)
Cardiology Consult    Patient ID: Jorge [name] MRN: [id number], DOB/AGE: October 27, 1945   Admit date: 04/28/2016 Date of Consult: 04/29/2016  Primary Physician: [name], PA-C Reason for Consult: Syncope, elevated troponin Primary Cardiologist: Dr. Eden Emms Requesting Provider: Dr. Sunnie Nielsen   Patient Profile    [name] is a 71 year old male with a past medical history of PAF (on Eliquis), HTN, HLD, obesity, OSA, AS, and CVA. He presented to the ED on 04/28/16 with syncope.   History of Present Illness    [name] was shaving his face in his bathroom yesterday morning and became light headed and remembers seeing a bright lights and then "went out". He does not remember falling. He did hit his head on the bathtub. Head CT was negative for bleed. His D dimer was elevated at 3.28, chest CT negative for PE.   His troponin was initially 0.05, subsequent troponins were 0.04 and 0.15. His EKG shows NSR with left axis deviation and LVH.   He recently had a syncopal episode about a month ago and he broke his left arm. He went to [facility] for his care there. He tells me that he did do what sounds like a Lexiscan Myoview but all he remembers is that his blood pressure dropped during the test. He did not have a heart cath.   Today, he became hypotensive, with SBP's in the 70-80's. EKG was done and is essentially unchanged from prior EKG's. His last Echo was in Nov. 2017, his LVEF was normal, but he did have moderate AS with peak gradient of 49 mm Hg and mean velocity of 243 cm/s.   Also with history of carotid artery stenosis and he follows with Dr. Darrick Penna. Last seen in Feb. 2016, duplex showed right ICA stenosis of 60-79%. Has had L CEA in 2014.   He says early this morning he did have some substernal chest pain associated with SOB. No radiation, not associated with diaphoresis or nausea.     Past Medical History   Past Medical History:  Diagnosis Date  . Chest pain   .  CHF (congestive heart failure) (HCC)   . Edema   . HTN (hypertension)   . Hyperlipidemia   . Neuropathy (HCC)   . Obesity   . SOB (shortness of breath)   . Stroke [facility])     Past Surgical History:  Procedure Laterality Date  . BACK SURGERY    . CARDIAC CATHETERIZATION    . CAROTID ANGIOGRAM N/A 05/30/2014   Procedure: Dorise Bullion;  Surgeon: Nada Libman, MD;  Location: [facility] CATH LAB;  Service: Cardiovascular;  Laterality: N/A;     Allergies  Allergies  Allergen Reactions  . Coumadin [Warfarin Sodium] Rash    Inpatient Medications    . ALPRAZolam  0.5 mg Oral QHS  . apixaban  5 mg Oral BID  . atorvastatin  40 mg Oral q1800  . buPROPion  450 mg Oral Daily  . ceFEPime (MAXIPIME) IV  2 g Intravenous Q12H  . gabapentin  800 mg Oral TID  . iopamidol      . sertraline  150 mg Oral Daily  . sodium chloride flush  3 mL Intravenous Q12H  . vancomycin  1,000 mg Intravenous Once    Family History    Family History  Problem Relation Age of Onset  . Heart attack Mother   . Stroke Father     Social History    Social History  Social History  . Marital status: Divorced    Spouse name: N/A  . Number of children: N/A  . Years of education: N/A   Occupational History  . Not on file.   Social History Main Topics  . Smoking status: Former Games developer  . Smokeless tobacco: Former Neurosurgeon    Quit date: 03/22/1996  . Alcohol use No  . Drug use: No  . Sexual activity: Not on file   Other Topics Concern  . Not on file   Social History Narrative  . No narrative on file     Review of Systems    General:  No chills, fever, night sweats or weight changes.  Cardiovascular:  No chest pain, dyspnea on exertion, edema, orthopnea, palpitations, paroxysmal nocturnal dyspnea. Dermatological: No rash, lesions/masses Respiratory: No cough, dyspnea Urologic: No hematuria, dysuria Abdominal:   No nausea, vomiting, diarrhea, bright red blood per rectum, melena, or  hematemesis Neurologic:  No visual changes, wkns, changes in mental status. All other systems reviewed and are otherwise negative except as noted above.  Physical Exam    Blood pressure (!) 89/52, pulse 71, temperature 97.8 F (36.6 C), temperature source Oral, resp. rate 18, height 6\' 2"  (1.88 m), weight 145 lb 6.4 oz (66 kg), SpO2 98 %.  General: Pleasant, NAD, obese male  Psych: Normal affect. Neuro: Alert and oriented X 3. Moves all extremities spontaneously. HEENT: Normal  Neck: Supple without bruits or JVD. Lungs:  Resp regular and unlabored, CTA. Heart: RRR no s3, s4, or murmurs. Abdomen: Soft, non-tender, non-distended, BS + x 4.  Extremities: No clubbing, cyanosis or edema. DP/PT/Radials 2+ and equal bilaterally.  Labs    Troponin [facility] Test)  Recent Labs  04/28/16 1302  TROPIPOC 0.01    Recent Labs  04/28/16 1249 04/28/16 1714 04/28/16 2329 04/29/16 0505  CKTOTAL 148  --   --   --   TROPONINI  --  0.05* 0.04* 0.15*   Lab Results  Component Value Date   WBC 9.7 04/29/2016   HGB 12.8 (L) 04/29/2016   HCT 40.1 04/29/2016   MCV 95.0 04/29/2016   PLT 309 04/29/2016     Recent Labs Lab 04/28/16 1209  NA 138  K 3.7  CL 103  CO2 26  BUN 11  CREATININE 0.99  CALCIUM 8.9  PROT 7.3  BILITOT 0.5  ALKPHOS 219*  ALT 17  AST 21  GLUCOSE 132*   Lab Results  Component Value Date   CHOL 147 05/25/2014   HDL 33 (L) 05/25/2014   LDLCALC 80 05/25/2014   TRIG 169 (H) 05/25/2014   Lab Results  Component Value Date   DDIMER 3.28 (H) 04/28/2016     Radiology Studies    Ct Angio Head W Or Wo Contrast  Result Date: 04/29/2016 CLINICAL DATA:  Initial evaluation for acute syncope. EXAM: CT ANGIOGRAPHY HEAD AND NECK TECHNIQUE: Multidetector CT imaging of the head and neck was performed using the standard protocol during bolus administration of intravenous contrast. Multiplanar CT image reconstructions and MIPs were obtained to evaluate the vascular  anatomy. Carotid stenosis measurements (when applicable) are obtained utilizing NASCET criteria, using the distal internal carotid diameter as the denominator. CONTRAST:  110 cc of Isovue 370. COMPARISON:  Prior head CT from 04/28/16. FINDINGS: CTA NECK FINDINGS Aortic arch: Visualized aortic arch of normal caliber with normal 3 vessel morphology. Mild atheromatous plaque within the arch itself. No high-grade stenosis about the origin of the great vessels. Focal kinking with  atheromatous plaque at the proximal right subclavian artery just be on the origin of the right common carotid artery. Secondary short-segment stenosis of approximately 70-80% (series 7, image 326). Subclavian arteries otherwise widely patent. Right carotid system: Right common carotid artery tortuous proximally but widely patent to the bifurcation. Atheromatous plaque about the right bifurcation/proximal right ICA. Associated multifocal irregularity with stenosis of up to 40-50% by NASCET criteria. Superimposed small penetrating plaque noted within this region (series 7, image 227). Distally, right ICA widely patent to the skullbase. No made of atheromatous plaque at the origin of the right external carotid artery without flow-limiting stenosis. Left carotid system: Left common carotid artery patent from its origin to the bifurcation. Eccentric calcified plaque about the left bifurcation/proximal left ICA with associated narrowing of approximately 30% by NASCET criteria. There is a irregular curvilinear filling defect within the proximal left ICA, likely a small dissection flap (series 7, image 210). No associated flow-limiting stenosis or significant intraluminal thrombus. This is of unknown chronicity. Distally, left ICA widely patent to the skullbase without additional stenosis. Vertebral arteries: Both of the vertebral arteries arise from the subclavian arteries. Vertebral arteries somewhat poorly evaluated proximally due to body habitus.  Vertebral arteries patent within the neck without flow-limiting stenosis, dissection, or occlusion. Skeleton: No acute osseous abnormality. No worrisome lytic or blastic osseous lesions. Other neck: No acute soft tissue abnormality within the neck. No adenopathy. Upper chest: Visualized upper mediastinum within normal limits. Visualized lung apices are clear. Review of the MIP images confirms the above findings CTA HEAD FINDINGS Anterior circulation: Petrous segments widely patent bilaterally. Scattered atheromatous plaque within the cavernous ICAs without flow-limiting stenosis. A1 segments patent. Anterior communicating artery patent. Anterior cerebral arteries demonstrate mild multifocal atheromatous irregularity but are patent to their distal aspects. M1 segments widely patent without stenosis or occlusion. MCA bifurcations normal. Distal MCA branches well opacified and symmetric. Mild distal small vessel atheromatous irregularity. Posterior circulation: Vertebral arteries patent to the vertebrobasilar junction without flow-limiting stenosis. Posterior inferior cerebral arteries patent proximally. Basilar artery widely patent without stenosis or occlusion. Superior cerebral arteries patent bilaterally. Left PCA supplied via the basilar and is well opacified to its distal aspect. Fetal type right PCA supplied via a widely patent right P com. Right PCA also patent to its distal aspect. Mild distal small vessel atheromatous irregularity. Venous sinuses: Patent. Anatomic variants: Fetal type right PCA. No aneurysm or vascular malformation. Delayed phase: No pathologic enhancement. Review of the MIP images confirms the above findings IMPRESSION: CTA NECK IMPRESSION: 1. 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 Finding is of unknown chronicity. 2. 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 3. Short-segment fairly  severe proximal right subclavian artery stenosis measuring up to 70-80%. 4. Widely patent vertebral arteries within the neck. CTA HEAD IMPRESSION: 1. Negative CTA of the intracranial circulation for large vessel occlusion or high-grade correctable stenosis. 2. Mild distal small vessel atheromatous irregularity involving both the anterior and posterior circulations. 3. Atheromatous plaque within the carotid siphons without significant stenosis. Electronically Signed   By: Rise MuBenjamin  McClintock M.D.   On: 04/29/2016 04:28   Dg Chest 1 View  Result Date: 04/28/2016 CLINICAL DATA:  Syncopal episode while shaving this morning. LEFT arm pain. History of recent humerus fracture. EXAM: CHEST 1 VIEW COMPARISON:  Chest radiograph March 21, 2016 FINDINGS: Cardiac silhouette is upper limits of normal in size. Mediastinal silhouette is nonsuspicious, mildly  calcified aortic knob. Persistently elevated RIGHT hemidiaphragm with bibasilar strandy densities. Rounded density projects in RIGHT lung apex. No pneumothorax. Soft tissue planes and included osseous structures are nonsuspicious. IMPRESSION: Rounded density projecting RIGHT lung apex could represent EEG lead sticker, less likely consolidation/pneumonia. Persistently elevated RIGHT hemidiaphragm with bibasilar atelectasis. Borderline cardiomegaly. Electronically Signed   By: Awilda Metro M.D.   On: 04/28/2016 13:29   Ct Head Wo Contrast  Result Date: 04/28/2016 CLINICAL DATA:  Trauma, fall while walking dog EXAM: CT HEAD WITHOUT CONTRAST CT CERVICAL SPINE WITHOUT CONTRAST TECHNIQUE: Multidetector CT imaging of the head and cervical spine was performed following the standard protocol without intravenous contrast. Multiplanar CT image reconstructions of the cervical spine were also generated. COMPARISON:  CT head dated 03/19/2016. CT head/ cervical spine dated 03/16/2016. FINDINGS: CT HEAD FINDINGS Brain: No evidence of acute infarction, hemorrhage,  hydrocephalus, extra-axial collection or mass lesion/mass effect. Subcortical white matter and periventricular small vessel ischemic changes. Global cortical atrophy. Vascular: Mild intracranial atherosclerosis. Skull: Normal. Negative for fracture or focal lesion. Sinuses/Orbits: Mild partial opacification of the right maxillary sinus. Visualized paranasal sinuses and mastoid air cells are otherwise clear. Other: None. CT CERVICAL SPINE FINDINGS Alignment: Normal cervical lordosis. Skull base and vertebrae: No acute fracture. No primary bone lesion or focal pathologic process. Soft tissues and spinal canal: No prevertebral fluid or swelling. No visible canal hematoma. Disc levels: Mild degenerative changes. Spinal canal remains patent. Upper chest: Visualized lung apices are essentially clear. Other: Visualized thyroid is unremarkable. IMPRESSION: No evidence of acute intracranial abnormality. Atrophy with small vessel ischemic changes. No evidence of traumatic injury to the cervical spine. Mild degenerative changes. Electronically Signed   By: Charline Bills M.D.   On: 04/28/2016 13:16   Ct Angio Neck W Or Wo Contrast  Result Date: 04/29/2016 CLINICAL DATA:  Initial evaluation for acute syncope. EXAM: CT ANGIOGRAPHY HEAD AND NECK TECHNIQUE: Multidetector CT imaging of the head and neck was performed using the standard protocol during bolus administration of intravenous contrast. Multiplanar CT image reconstructions and MIPs were obtained to evaluate the vascular anatomy. Carotid stenosis measurements (when applicable) are obtained utilizing NASCET criteria, using the distal internal carotid diameter as the denominator. CONTRAST:  110 cc of Isovue 370. COMPARISON:  Prior head CT from 04/28/16. FINDINGS: CTA NECK FINDINGS Aortic arch: Visualized aortic arch of normal caliber with normal 3 vessel morphology. Mild atheromatous plaque within the arch itself. No high-grade stenosis about the origin of the great  vessels. Focal kinking with atheromatous plaque at the proximal right subclavian artery just be on the origin of the right common carotid artery. Secondary short-segment stenosis of approximately 70-80% (series 7, image 326). Subclavian arteries otherwise widely patent. Right carotid system: Right common carotid artery tortuous proximally but widely patent to the bifurcation. Atheromatous plaque about the right bifurcation/proximal right ICA. Associated multifocal irregularity with stenosis of up to 40-50% by NASCET criteria. Superimposed small penetrating plaque noted within this region (series 7, image 227). Distally, right ICA widely patent to the skullbase. No made of atheromatous plaque at the origin of the right external carotid artery without flow-limiting stenosis. Left carotid system: Left common carotid artery patent from its origin to the bifurcation. Eccentric calcified plaque about the left bifurcation/proximal left ICA with associated narrowing of approximately 30% by NASCET criteria. There is a irregular curvilinear filling defect within the proximal left ICA, likely a small dissection flap (series 7, image 210). No associated flow-limiting stenosis or significant intraluminal thrombus. This is  of unknown chronicity. Distally, left ICA widely patent to the skullbase without additional stenosis. Vertebral arteries: Both of the vertebral arteries arise from the subclavian arteries. Vertebral arteries somewhat poorly evaluated proximally due to body habitus. Vertebral arteries patent within the neck without flow-limiting stenosis, dissection, or occlusion. Skeleton: No acute osseous abnormality. No worrisome lytic or blastic osseous lesions. Other neck: No acute soft tissue abnormality within the neck. No adenopathy. Upper chest: Visualized upper mediastinum within normal limits. Visualized lung apices are clear. Review of the MIP images confirms the above findings CTA HEAD FINDINGS Anterior circulation:  Petrous segments widely patent bilaterally. Scattered atheromatous plaque within the cavernous ICAs without flow-limiting stenosis. A1 segments patent. Anterior communicating artery patent. Anterior cerebral arteries demonstrate mild multifocal atheromatous irregularity but are patent to their distal aspects. M1 segments widely patent without stenosis or occlusion. MCA bifurcations normal. Distal MCA branches well opacified and symmetric. Mild distal small vessel atheromatous irregularity. Posterior circulation: Vertebral arteries patent to the vertebrobasilar junction without flow-limiting stenosis. Posterior inferior cerebral arteries patent proximally. Basilar artery widely patent without stenosis or occlusion. Superior cerebral arteries patent bilaterally. Left PCA supplied via the basilar and is well opacified to its distal aspect. Fetal type right PCA supplied via a widely patent right P com. Right PCA also patent to its distal aspect. Mild distal small vessel atheromatous irregularity. Venous sinuses: Patent. Anatomic variants: Fetal type right PCA. No aneurysm or vascular malformation. Delayed phase: No pathologic enhancement. Review of the MIP images confirms the above findings IMPRESSION: CTA NECK IMPRESSION: 1. 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 Finding is of unknown chronicity. 2. 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 3. 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 4. Widely patent vertebral arteries within the neck. CTA HEAD IMPRESSION: 1. Negative CTA of the intracranial circulation for large vessel occlusion or high-grade correctable stenosis. 2. Mild distal small vessel atheromatous irregularity involving both the anterior and posterior circulations. 3. Atheromatous plaque within the carotid siphons without significant stenosis.  Electronically Signed   By: Rise Mu M.D.   On: 04/29/2016 04:28   Ct Angio Chest Pe W Or Wo Contrast  Result Date: 04/29/2016 CLINICAL DATA:  71 year old male with elevated D-dimer. EXAM: CT ANGIOGRAPHY CHEST WITH CONTRAST TECHNIQUE: Multidetector CT imaging of the chest was performed using the standard protocol during bolus administration of intravenous contrast. Multiplanar CT image reconstructions and MIPs were obtained to evaluate the vascular anatomy. CONTRAST:  100 cc Isovue 370 COMPARISON:  Chest radiograph dated 04/28/2016 FINDINGS: Cardiovascular: There is mild cardiomegaly. No pericardial effusion. Mild coronary vascular calcification predominantly involving the LAD. Mild atherosclerotic calcification of the thoracic aorta. No aneurysmal dilatation or evidence of dissection. The origins of the great vessels of the aortic arch appear patent. Evaluation of the pulmonary arteries is limited due to suboptimal opacification of the distal branches. No large central pulmonary artery embolus identified. Mediastinum/Nodes: There is no hilar or mediastinal adenopathy. The esophagus is grossly unremarkable. Lungs/Pleura: Right lung base subsegmental atelectatic changes noted. Infiltrate is less likely but not excluded. Clinical correlation is recommended. There is no pleural effusion or pneumothorax. The central airways are patent. Upper Abdomen: There is diffuse fatty infiltration of the liver. Slight irregularity of the hepatic contour concerning for cirrhosis. There is mild eventration of the right hemidiaphragm. Musculoskeletal: Mild degenerative changes of the spine. No acute fracture. Review of the MIP  images confirms the above findings. IMPRESSION: No CT evidence of central pulmonary artery embolus. Mild elevation of the right hemidiaphragm with right lung base subsegmental atelectasis/ scarring. Infiltrate is less likely. Clinical correlation is recommended. Electronically Signed   By: Elgie Collard M.D.   On: 04/29/2016 04:17   Ct Cervical Spine Wo Contrast  Result Date: 04/28/2016 CLINICAL DATA:  Trauma, fall while walking dog EXAM: CT HEAD WITHOUT CONTRAST CT CERVICAL SPINE WITHOUT CONTRAST TECHNIQUE: Multidetector CT imaging of the head and cervical spine was performed following the standard protocol without intravenous contrast. Multiplanar CT image reconstructions of the cervical spine were also generated. COMPARISON:  CT head dated 03/19/2016. CT head/ cervical spine dated 03/16/2016. FINDINGS: CT HEAD FINDINGS Brain: No evidence of acute infarction, hemorrhage, hydrocephalus, extra-axial collection or mass lesion/mass effect. Subcortical white matter and periventricular small vessel ischemic changes. Global cortical atrophy. Vascular: Mild intracranial atherosclerosis. Skull: Normal. Negative for fracture or focal lesion. Sinuses/Orbits: Mild partial opacification of the right maxillary sinus. Visualized paranasal sinuses and mastoid air cells are otherwise clear. Other: None. CT CERVICAL SPINE FINDINGS Alignment: Normal cervical lordosis. Skull base and vertebrae: No acute fracture. No primary bone lesion or focal pathologic process. Soft tissues and spinal canal: No prevertebral fluid or swelling. No visible canal hematoma. Disc levels: Mild degenerative changes. Spinal canal remains patent. Upper chest: Visualized lung apices are essentially clear. Other: Visualized thyroid is unremarkable. IMPRESSION: No evidence of acute intracranial abnormality. Atrophy with small vessel ischemic changes. No evidence of traumatic injury to the cervical spine. Mild degenerative changes. Electronically Signed   By: Charline Bills M.D.   On: 04/28/2016 13:16   Dg Chest Port 1 View  Result Date: 04/29/2016 CLINICAL DATA:  Acute onset shortness of Breath EXAM: PORTABLE CHEST 1 VIEW COMPARISON:  04/29/2016, 04/28/2016 FINDINGS: Cardiac shadow is again enlarged but stable. Elevation of the right  hemidiaphragm is again noted with mild atelectatic changes. No focal confluent infiltrate is seen. No other focal abnormality is noted IMPRESSION: Stable right basilar atelectasis and elevation the right hemidiaphragm. No acute abnormality is seen. Electronically Signed   By: Alcide Clever M.D.   On: 04/29/2016 09:20   Dg Humerus Left  Result Date: 04/28/2016 CLINICAL DATA:  Syncopal episode while shaving this morning. LEFT arm pain. History of recent humerus fracture. EXAM: LEFT HUMERUS - 2+ VIEW COMPARISON:  LEFT humerus fracture March 20, 2016 FINDINGS: Healing displaced proximal humerus fracture with callus, rarefaction of the fracture line. No new fracture deformity. No dislocation. Distal humerus in fiberglass cast. No destructive bony lesions. Soft tissue planes are nonsuspicious. IMPRESSION: Healing displaced proximal LEFT humerus fracture ; no new fracture deformity or dislocation. Electronically Signed   By: Awilda Metro M.D.   On: 04/28/2016 13:31    EKG & Cardiac Imaging    EKG: NSR. Left axis deviation.   Echocardiogram: pending.   Assessment & Plan    1. Syncope/NSTEMI: patient has had 2 syncopal episodes in the past 2 months. Both of which sound like true cardiac syncope. Troponin elevated and he has risk factors for CAD including family history, age, obesity.   Will need an ischemic evaluation by cath, last dose of Eliquis was yesterday morning. Will start IV heparin and hold Eliquis.   Start pressors if needed, Levophed and titrate for MAP > 65. Although his BP has improved so far with fluids.   2. History of CVA  3. Carotid artery stenosis: R carotid was last checked in 2016, can repeat  to see if stenosis has advanced in setting of syncope x 2.   4. History of moderate AS: Will repeat Echo.   Signed, Little Ishikawa, NP 04/29/2016, 9:36 AM Pager: [phone] Patient seen and examined and history reviewed. Agree with above findings and plan. 71 yo WM admitted with  syncope. History of moderate AS. No known history of CAD. Patient reports having a heart cath years ago but I can find no evidence of this. Yesterday he was shaving and turned to leave the bathroom when he suddenly saw bright lights and passed out losing consciousness. He felt into bathtub and states it took him 45 minutes to extricate himself from bathtub because he was so weak. He also noted chest pain and SOB at that time. One month ago he was walking his dog and walked a little further than usual (1/2 block). He states his legs got weak and gave way. He fell and fractured his left arm but denies losing consciousness. On exam he is a morbidly obese WM in NAD Lungs are clear. JV pulse is flat CV RRR with 2-3/6 harsh systolic murmur of AS Abd obese soft and NT. Normal BS. No edema.  Ecg shows NSR with LVH with QRS widening and repolarization abnormality. No acute change. XRays, and labs reviewed  Impression: 1. Syncope. I suspect this was either ischemic mediated or related to arrhythmia. AS may be contributing.  Doubt related to carotid disease. Patient hypotensive this am so may be volume depleted as well. Will hold lasix and metoprolol. IV fluids. Hold Eliquis. Plan right and left heart cath tomorrow. If cath is unrevealing will need to consider for implantable loop recorder.  2. NSTEMI.  3. Aortic stenosis. 4. Morbid obesity. 5. PAF. Currently in NSR 6. History of CVA 7. HTN 8. HL 9. Poor functional capacity.   Sante Biedermann [place], MDFACC 04/29/2016 10:20 AM

## 2016-04-29 NOTE — Progress Notes (Signed)
Called to room by NT stating BP was low, 80's/30's by [employer].  RN checked manual 78/48.  MD notified, orders received.  MD arrived in room to assess patient.  Heart rate 70/s, O2 sat 97% on 3 liters.

## 2016-04-29 NOTE — Progress Notes (Signed)
Nutrition Brief Note  Malnutrition Screening Tool result is inaccurate. RD met with patient to repeat nutrition screening. Pt denies any unintentional weight loss. He reports losing from 356 lbs to 325 lbs over > 1 year. He denies eating poorly due to having a decreased appetite. He states that his appetite is good and he has been eating normally. He states that he usually eats a bowl of cereal in the morning and one meal from [employer] at night.    Wt Readings from Last 15 Encounters:  04/29/16 145 lb 6.4 oz (66 kg)  06/01/15 (!) 327 lb (148.3 kg)  05/10/15 (!) 327 lb 11.2 oz (148.6 kg)  05/24/14 (!) 347 lb 3.6 oz (157.5 kg)  03/29/12 (!) 350 lb (158.8 kg)  03/22/12 (!) 367 lb (166.5 kg)   Patient meets criteria for Obesity based on current BMI of 41.98 kg/(m^2). Note that today's weight is inaccurate (weight on 04/28/16 was 144.9 kg).   Current diet order is Heart Healthy, patient is consuming approximately 80% of meals at this time. RD explained Heart Healthy diet guidelines. Pt denies any nutrition-related questions or education needs at this time. Labs and medications reviewed.   No nutrition interventions warranted at this time. If nutrition issues arise, please consult RD.   Scarlette Ar RD, CSP, LDN Inpatient Clinical Dietitian Pager: [phone] After Hours Pager: [phone]

## 2016-04-29 NOTE — Evaluation (Signed)
Physical Therapy Evaluation Patient Details Name: Jorge DroughtGeorge O Bourque MRN: [id number] DOB: 10-26-1945 Today's Date: 04/29/2016   History of Present Illness  Jorge [name] is a 71 y.o. male  who presents with syncope. Cardiac vs neck/intracranial vascular vs PE. Pt w/ h/o falls including fall ~1 month ago resulting in LUE fracture - pt with cast and then a fall at home after "blacking out" & bumping his head in bathroom after shaving on day of admit.  Clinical Impression  Patient demonstrates deficits in functional mobility as indicated below. Will need continued skilled PT to address deficits and maximize function. Will see as indicated and progress as tolerated.   OF NOTE: Saturations 93% on 3 liters at rest and BP 112/64 sitting and 104/60 supine    Follow Up Recommendations SNF;Supervision/Assistance - 24 hour (pending progress)    Equipment Recommendations  None recommended by PT    Recommendations for Other Services       Precautions / Restrictions Precautions Precautions: Fall Precaution Comments: H/O falls (1x ~1 month ago - has cast on LUE and another day of this admit, fell at home in bathroom and "blacked out and hit head); uses cane when outside not in house Restrictions Weight Bearing Restrictions: No      Mobility  Bed Mobility Overal bed mobility: Needs Assistance Bed Mobility: Rolling;Sidelying to Sit;Sit to Supine Rolling: Mod assist Sidelying to sit: Mod assist (significant time and effort to perform from R sidelying)   Sit to supine: Min assist   General bed mobility comments: Moderate assist to roll and position to right side, moderate assist to power up to sitting position at EOB with increased time and effort noted. Min assist for Le elevation back to bed  Transfers Overall transfer level: Needs assistance Equipment used: 1 person hand held assist Transfers: Sit to/from Stand Sit to Stand: Min assist         General transfer comment: Min assist  for stability, able to power up without difficulty  Ambulation/Gait Ambulation/Gait assistance: Min assist Ambulation Distance (Feet): 4 Feet Assistive device: 1 person hand held assist       General Gait Details: min assist to take [facility], deferred further mobility due to dizziness at this time.  Stairs            Wheelchair Mobility    Modified Rankin (Stroke Patients Only)       Balance Overall balance assessment: History of Falls                                           Pertinent Vitals/Pain Pain Assessment: 0-10 Pain Score: 6  Pain Location: left arm Pain Descriptors / Indicators: Sore Pain Intervention(s): Limited activity within patient's tolerance;Monitored during session    Home Living Family/patient expects to be discharged to:: Private residence Living Arrangements: Alone   Type of Home: Independent living facility Home Access: Level entry     Home Layout: One level Home Equipment: Cane - single point;Walker - 4 wheels;Grab bars - tub/shower;Hand held shower head;Tub bench Additional Comments: Has toilet riser but has not had it installed yet    Prior Function Level of Independence: Needs assistance   Gait / Transfers Assistance Needed: uses cane outside of apartment  ADL's / Homemaking Assistance Needed: Does own bathing and dressing; reports difficulty w/ "cleaning myself" since he fractured his arm. daughter  in law gets grocersies for him        Hand Dominance   Dominant Hand: Left    Extremity/Trunk Assessment   Upper Extremity Assessment LUE Deficits / Details: LUE fracture and in a cast s/p fall ~1 month ago at home. Pt is L HD     Lower Extremity Assessment Lower Extremity Assessment: Generalized weakness;RLE deficits/detail;LLE deficits/detail RLE Deficits / Details: proximal strength deficits noted R>L RLE Coordination: decreased fine motor       Communication   Communication: No  difficulties  Cognition Arousal/Alertness: Lethargic Behavior During Therapy: WFL for tasks assessed/performed Overall Cognitive Status: No family/caregiver present to determine baseline cognitive functioning                 General Comments: patient initially lethargic but able to arouse with increased activity.    General Comments      Exercises     Assessment/Plan    PT Assessment Patient needs continued PT services  PT Problem List Decreased strength;Decreased range of motion;Decreased activity tolerance;Decreased balance;Decreased mobility;Decreased coordination;Decreased safety awareness;Cardiopulmonary status limiting activity;Obesity;Pain          PT Treatment Interventions DME instruction;Gait training;Stair training;Functional mobility training;Therapeutic activities;Therapeutic exercise;Balance training;Patient/family education    PT Goals (Current goals can be found in the Care Plan section)  Acute Rehab PT Goals Patient Stated Goal: Get better PT Goal Formulation: With patient Time For Goal Achievement: 05/13/16 Potential to Achieve Goals: Good    Frequency Min 3X/week   Barriers to discharge        Co-evaluation               End of Session Equipment Utilized During Treatment: Gait belt;Oxygen Activity Tolerance: Patient limited by fatigue Patient left: in bed;with call bell/phone within reach;with bed alarm set Nurse Communication: Mobility status    Functional Assessment Tool Used: clinical judgement Functional Limitation: Mobility: Walking and moving around Mobility: Walking and Moving Around Current Status (V5643): At least 20 percent but less than 40 percent impaired, limited or restricted Mobility: Walking and Moving Around Goal Status [phone]): At least 1 percent but less than 20 percent impaired, limited or restricted    Time: 8841-6606 PT Time Calculation (min) (ACUTE ONLY): 21 min   Charges:   PT Evaluation $PT Eval Moderate  Complexity: 1 Procedure     PT G Codes:   PT G-Codes **NOT FOR INPATIENT CLASS** Functional Assessment Tool Used: clinical judgement Functional Limitation: Mobility: Walking and moving around Mobility: Walking and Moving Around Current Status (T0160): At least 20 percent but less than 40 percent impaired, limited or restricted Mobility: Walking and Moving Around Goal Status [phone]): At least 1 percent but less than 20 percent impaired, limited or restricted    Fabio Asa 04/29/2016, 5:21 PM  Charlotte Crumb, PT DPT  [phone]

## 2016-04-29 NOTE — Progress Notes (Signed)
ANTICOAGULATION CONSULT NOTE - f/u Consult  Pharmacy Consult for Heparin Indication: chest pain/ACS  Allergies  Allergen Reactions  . Coumadin [Warfarin Sodium] Rash    Patient Measurements: Height: 6\' 2"  (188 cm) Weight: (!) 323 lb 11.2 oz (146.8 kg) IBW/kg (Calculated) : 82.2 Heparin Dosing Weight: 115.4 kg  Vital Signs: BP: 104/51 (01/23 1432) Pulse Rate: 75 (01/23 1432)  Labs:  Recent Labs  04/28/16 1209 04/28/16 1249  04/29/16 0505 04/29/16 0856 04/29/16 1145 04/29/16 1653 04/29/16 1839  HGB  --  13.7  --   --  12.8*  --   --   --   HCT  --  42.8  --   --  40.1  --   --   --   PLT  --  306  --   --  309  --   --   --   APTT  --   --   --   --   --  55*  --  41*  HEPARINUNFRC  --   --   --   --   --  0.73*  --   --   CREATININE 0.99  --   --   --   --  1.01  --   --   CKTOTAL  --  148  --   --   --   --   --   --   TROPONINI  --   --   < > 0.15*  --  <0.03 <0.03  --   < > = values in this interval not displayed.  Estimated Creatinine Clearance: 104 mL/min (by C-G formula based on SCr of 1.01 mg/dL).   Medical History: Past Medical History:  Diagnosis Date  . Chest pain   . CHF (congestive heart failure) (HCC)   . Edema   . HTN (hypertension)   . Hyperlipidemia   . Neuropathy (HCC)   . Obesity   . SOB (shortness of breath)   . Stroke [facility])     Medications:  Prescriptions Prior to Admission  Medication Sig Dispense Refill Last Dose  . ALPRAZolam (XANAX) 0.5 MG tablet Take 0.5 mg by mouth at bedtime.    04/27/2016 at Unknown time  . apixaban (ELIQUIS) 5 MG TABS tablet Take 1 tablet (5 mg total) by mouth 2 (two) times daily. 60 tablet 0 04/27/2016 at 2200  . atorvastatin (LIPITOR) 40 MG tablet Take 1 tablet (40 mg total) by mouth daily at 6 PM. 30 tablet 0 04/27/2016 at Unknown time  . buPROPion (WELLBUTRIN SR) 150 MG 12 hr tablet Take 450 mg by mouth daily.    04/27/2016 at Unknown time  . furosemide (LASIX) 40 MG tablet Take 40 mg by mouth daily.    04/27/2016 at Unknown time  . gabapentin (NEURONTIN) 800 MG tablet Take 800 mg by mouth 3 (three) times daily.    04/27/2016 at Unknown time  . hydrOXYzine (ATARAX/VISTARIL) 25 MG tablet Take 25 mg by mouth 3 (three) times daily as needed.   Past Month at Unknown time  . metoprolol tartrate (LOPRESSOR) 25 MG tablet Take 0.5 tablets (12.5 mg total) by mouth 2 (two) times daily. (Patient taking differently: Take 25 mg by mouth 2 (two) times daily. ) 30 tablet 0 04/27/2016 at 2200  . oxycodone (OXY-IR) 5 MG capsule Take 5 mg by mouth every 4 (four) hours as needed for pain.    04/27/2016 at Unknown time  . sertraline (ZOLOFT) 100 MG tablet Take 150  mg by mouth daily.    04/27/2016 at Unknown time  . traMADol (ULTRAM) 50 MG tablet Take 50 mg by mouth every 6 (six) hours as needed for moderate pain.    Past Month at Unknown time   Scheduled:  . ALPRAZolam  0.5 mg Oral QHS  . [START ON 04/30/2016] aspirin  81 mg Oral Pre-Cath  . atorvastatin  40 mg Oral q1800  . buPROPion  450 mg Oral Daily  . ceFEPime (MAXIPIME) IV  2 g Intravenous Q12H  . gabapentin  800 mg Oral TID  . sertraline  150 mg Oral Daily  . sodium chloride flush  3 mL Intravenous Q12H  . vancomycin  750 mg Intravenous Q12H   Infusions:  . sodium chloride 100 mL/hr at 04/29/16 1030  . [START ON 04/30/2016] sodium chloride    . heparin 750 Units/hr (04/29/16 1033)    Assessment: 71yo male with history of Afib on eliquis, HTN, HLD, OSA and CVA presents with syncope. Pharmacy is consulted to dose heparin for ACS/chest pain. Last apixaban dose was taken 04/27/16 at 2200. As dose has been over 24h, will bolus heparin. - PM aptt 41 (erroneous weight this am 145 lbs instead of KG)  Goal of Therapy:  Heparin level 0.3-0.7 units/ml aPTT 66-102 seconds Monitor platelets by anticoagulation protocol: Yes   Plan:  Heparin 4000 unit bolus Increase heparin infusion to 1600 units/hr Check HL and CBC daily  Asheton Viramontes S. Merilynn Finlandobertson, PharmD,  BCPS Clinical Staff Pharmacist Pager [phone]  04/29/2016,7:50 PM

## 2016-04-29 NOTE — Progress Notes (Signed)
ANTICOAGULATION CONSULT NOTE - Initial Consult  Pharmacy Consult for Heparin Indication: chest pain/ACS  Allergies  Allergen Reactions  . Coumadin [Warfarin Sodium] Rash    Patient Measurements: Height: 6\' 2"  (188 cm) Weight: 145 lb 6.4 oz (66 kg) IBW/kg (Calculated) : 82.2 Heparin Dosing Weight: 66 kg  Vital Signs: Temp: 97.8 F (36.6 C) (01/23 0618) Temp Source: Oral (01/23 0618) BP: 89/52 (01/23 0933) Pulse Rate: 71 (01/23 0922)  Labs:  Recent Labs  04/28/16 1209 04/28/16 1249 04/28/16 1714 04/28/16 2329 04/29/16 0505 04/29/16 0856  HGB  --  13.7  --   --   --  12.8*  HCT  --  42.8  --   --   --  40.1  PLT  --  306  --   --   --  309  CREATININE 0.99  --   --   --   --   --   CKTOTAL  --  148  --   --   --   --   TROPONINI  --   --  0.05* 0.04* 0.15*  --     Estimated Creatinine Clearance: 64.8 mL/min (by C-G formula based on SCr of 0.99 mg/dL).   Medical History: Past Medical History:  Diagnosis Date  . Chest pain   . CHF (congestive heart failure) (HCC)   . Edema   . HTN (hypertension)   . Hyperlipidemia   . Neuropathy (HCC)   . Obesity   . SOB (shortness of breath)   . Stroke [facility](HCC)     Medications:  Prescriptions Prior to Admission  Medication Sig Dispense Refill Last Dose  . ALPRAZolam (XANAX) 0.5 MG tablet Take 0.5 mg by mouth at bedtime.    04/27/2016 at Unknown time  . apixaban (ELIQUIS) 5 MG TABS tablet Take 1 tablet (5 mg total) by mouth 2 (two) times daily. 60 tablet 0 04/27/2016 at 2200  . atorvastatin (LIPITOR) 40 MG tablet Take 1 tablet (40 mg total) by mouth daily at 6 PM. 30 tablet 0 04/27/2016 at Unknown time  . buPROPion (WELLBUTRIN SR) 150 MG 12 hr tablet Take 450 mg by mouth daily.    04/27/2016 at Unknown time  . furosemide (LASIX) 40 MG tablet Take 40 mg by mouth daily.   04/27/2016 at Unknown time  . gabapentin (NEURONTIN) 800 MG tablet Take 800 mg by mouth 3 (three) times daily.    04/27/2016 at Unknown time  . hydrOXYzine  (ATARAX/VISTARIL) 25 MG tablet Take 25 mg by mouth 3 (three) times daily as needed.   Past Month at Unknown time  . metoprolol tartrate (LOPRESSOR) 25 MG tablet Take 0.5 tablets (12.5 mg total) by mouth 2 (two) times daily. (Patient taking differently: Take 25 mg by mouth 2 (two) times daily. ) 30 tablet 0 04/27/2016 at 2200  . oxycodone (OXY-IR) 5 MG capsule Take 5 mg by mouth every 4 (four) hours as needed for pain.    04/27/2016 at Unknown time  . sertraline (ZOLOFT) 100 MG tablet Take 150 mg by mouth daily.    04/27/2016 at Unknown time  . traMADol (ULTRAM) 50 MG tablet Take 50 mg by mouth every 6 (six) hours as needed for moderate pain.    Past Month at Unknown time   Scheduled:  . ALPRAZolam  0.5 mg Oral QHS  . atorvastatin  40 mg Oral q1800  . buPROPion  450 mg Oral Daily  . ceFEPime (MAXIPIME) IV  2 g Intravenous Q12H  . gabapentin  800 mg Oral TID  . iopamidol      . sertraline  150 mg Oral Daily  . sodium chloride flush  3 mL Intravenous Q12H  . vancomycin  1,000 mg Intravenous Once  . vancomycin  750 mg Intravenous Q12H   Infusions:  . sodium chloride      Assessment: 71yo male with history of Afib on eliquis, HTN, HLD, OSA and CVA presents with syncope. Pharmacy is consulted to dose heparin for ACS/chest pain. Last apixaban dose was taken 04/27/16 at 2200. As dose has been over 24h, will bolus heparin.  Goal of Therapy:  Heparin level 0.3-0.7 units/ml aPTT 66-102 seconds Monitor platelets by anticoagulation protocol: Yes   Plan:  Give 3000 units bolus x 1 Start heparin infusion at 750 units/hr Check anti-Xa level in 8 hours and daily while on heparin Continue to monitor H&H and platelets  Arlean Hopping. Newman Pies, PharmD, BCPS Clinical Pharmacist [phone] 04/29/2016,10:09 AM

## 2016-04-29 NOTE — Progress Notes (Signed)
Pharmacy Antibiotic Note  Jorge [name] is a 71 y.o. male admitted on 04/28/2016 with sepsis.  Pharmacy has been consulted for cefepime and vancomycin dosing.  Plan: Vancomycin 750mg  IV every 12 hours.  Goal trough 15-20 mcg/mL.  Cefepime 2g IV q12h Monitor culture data, renal function and clinical course VT at SS prn  Height: 6\' 2"  (188 cm) Weight: 145 lb 6.4 oz (66 kg) IBW/kg (Calculated) : 82.2  Temp (24hrs), Avg:98.3 F (36.8 C), Min:97.8 F (36.6 C), Max:98.7 F (37.1 C)   Recent Labs Lab 04/28/16 1209 04/28/16 1249 04/29/16 0856  WBC  --  14.2* 9.7  CREATININE 0.99  --   --     Estimated Creatinine Clearance: 64.8 mL/min (by C-G formula based on SCr of 0.99 mg/dL).    Allergies  Allergen Reactions  . Coumadin [Warfarin Sodium] Rash     Arlean Hoppingorey M. Newman PiesBall, PharmD, BCPS Clinical Pharmacist [phone] 04/29/2016 8:57 AM

## 2016-04-29 NOTE — Progress Notes (Signed)
PROGRESS NOTE    Jorge [name]  ZOX:[id number] DOB: 07/13/1945 DOA: 04/28/2016 PCP: [name], PA-C    Brief Narrative: Jorge [name] is a 71 y.o. male with a history of paroxysmal atrial fibrillation on Eliquis , DCHF,  AS,  hypertension, hyperlipidemia, obey CT, prior history of stroke, carotid artery disease, brought to the ED after a syncopal episode. In review, she was shaving around 8:30 this morning, when he turned his head to the right, accompanied by some prodromal symptoms including nausea, and "shinning lights:, falling an hitting his head against the bathtub. Of note, patient has trouble finding words since his last stroke  Assessment & Plan:   Active Problems:   Dyspnea   Aortic stenosis   History of CEA (carotid endarterectomy)   Chest pain   CAD (coronary artery disease)   History of tobacco abuse   HLD (hyperlipidemia)   CHF (congestive heart failure) (HCC)   Syncope  Hypotension.  This might explain syncope.  Unclear etiology for hypotension.  Will proceed with evaluation: Lactic acid, CBC , cortisol level. Pan culture.  Hold BP medications.  Denies fever, cough, chest pain, UR symptoms, no abdominal pain. Had some diarrhea few days ago which has resolved.  IV bolus, check x ray. We might have limitation  with fluids due to hypoxemia. Low threshold to consult CCM.  Unclear if hypotension is cardiac related. Cardiology consulted. ECHO ordered.  Transfer to step down unit.   WBC on admission at 14, will start antibiotics empirically.   Syncope of unknown etiology.   -D-Dimer 3.28 EKG unrevealing. CT head negative for acute intracranial abnormalities. -CT angio chest negative for central PE.  -Elevation troponin, cardiology consulted.  -CT angio neck; Atheromatous stenoses involving the proximal ICAs bilaterally, measuring up to 40-50% on the right, and 30% on the left.  Short-segment fairly severe proximal right subclavian artery stenosis measuring up to  70-80%.  Widely patent vertebral arteries within the neck. There is a irregular curvilinear filling defect within the proximal left ICA, likely a small dissection flap (series 7, image 210). -Due to abnormal CT angio neck I have consulted vascular.   Hypertension  Hold BP medications, patient hypotensive.   Paroxysmal Atrial Fibrillation CHA2DS2-VASc score 6 , on anticoagulation with Eliquis . EKG on SR   Rate controlled -Continue meds  Hyperlipidemia Continue home statins   Peripheral Neuropathy Continue Neurontin   Depression Continue home Zoloft     DVT prophylaxis: on eliquis.  Code Status:  Full code.  Family Communication: care discussed with patient.  Disposition Plan: transfer to step down unit.    Consultants:   Cardiology    Procedures:   ECHO.    Antimicrobials:    Subjective: Feels funny, weak.  Denies chest pain, dyspnea, abdominal pain.   Objective: Vitals:   04/28/16 1900 04/28/16 1930 04/28/16 2132 04/29/16 0618  BP: 108/78 128/76 121/69 (!) 97/44  Pulse: 87 82 79 78  Resp: 22  18 20   Temp:   98.6 F (37 C) 97.8 F (36.6 C)  TempSrc:   Oral Oral  SpO2: 96% 98% 96% 97%  Weight:   (!) 144.9 kg (319 lb 7.1 oz) 66 kg (145 lb 6.4 oz)  Height:   6\' 2"  (1.88 m)     Intake/Output Summary (Last 24 hours) at 04/29/16 0750 Last data filed at 04/29/16 0600  Gross per 24 hour  Intake              240 ml  Output              450 ml  Net             -210 ml   Filed Weights   04/28/16 2132 04/29/16 0618  Weight: (!) 144.9 kg (319 lb 7.1 oz) 66 kg (145 lb 6.4 oz)    Examination:  General exam: Appears calm and comfortable on 3 L oxygen.  Respiratory system: Clear to auscultation. Respiratory effort normal. Cardiovascular system: S1 & S2 heard, RRR. No JVD, murmurs, rubs, gallops or clicks. No pedal edema. Gastrointestinal system: Abdomen is nondistended, soft and nontender. No organomegaly or masses felt. Normal bowel sounds  heard. Central nervous system: Alert and oriented. No focal neurological deficits. Extremities: Symmetric 5 x 5 power. Skin: No rashes, lesions or ulcers Psychiatry: Judgement and insight appear normal. Mood & affect appropriate.     Data Reviewed: I have personally reviewed following labs and imaging studies  CBC:  Recent Labs Lab 04/28/16 1249  WBC 14.2*  HGB 13.7  HCT 42.8  MCV 94.5  PLT 306   Basic Metabolic Panel:  Recent Labs Lab 04/28/16 1209  NA 138  K 3.7  CL 103  CO2 26  GLUCOSE 132*  BUN 11  CREATININE 0.99  CALCIUM 8.9   GFR: Estimated Creatinine Clearance: 64.8 mL/min (by C-G formula based on SCr of 0.99 mg/dL). Liver Function Tests:  Recent Labs Lab 04/28/16 1209  AST 21  ALT 17  ALKPHOS 219*  BILITOT 0.5  PROT 7.3  ALBUMIN 3.8   No results for input(s): LIPASE, AMYLASE in the last 168 hours. No results for input(s): AMMONIA in the last 168 hours. Coagulation Profile: No results for input(s): INR, PROTIME in the last 168 hours. Cardiac Enzymes:  Recent Labs Lab 04/28/16 1249 04/28/16 1714 04/28/16 2329 04/29/16 0505  CKTOTAL 148  --   --   --   TROPONINI  --  0.05* 0.04* 0.15*   BNP (last 3 results) No results for input(s): PROBNP in the last 8760 hours. HbA1C: No results for input(s): HGBA1C in the last 72 hours. CBG:  Recent Labs Lab 04/28/16 1235 04/29/16 0616  GLUCAP 136* 121*   Lipid Profile: No results for input(s): CHOL, HDL, LDLCALC, TRIG, CHOLHDL, LDLDIRECT in the last 72 hours. Thyroid Function Tests: No results for input(s): TSH, T4TOTAL, FREET4, T3FREE, THYROIDAB in the last 72 hours. Anemia Panel: No results for input(s): VITAMINB12, FOLATE, FERRITIN, TIBC, IRON, RETICCTPCT in the last 72 hours. Sepsis Labs: No results for input(s): PROCALCITON, LATICACIDVEN in the last 168 hours.  No results found for this or any previous visit (from the past 240 hour(s)).       Radiology Studies: Ct Angio Head W  Or Wo Contrast  Result Date: 04/29/2016 CLINICAL DATA:  Initial evaluation for acute syncope. EXAM: CT ANGIOGRAPHY HEAD AND NECK TECHNIQUE: Multidetector CT imaging of the head and neck was performed using the standard protocol during bolus administration of intravenous contrast. Multiplanar CT image reconstructions and MIPs were obtained to evaluate the vascular anatomy. Carotid stenosis measurements (when applicable) are obtained utilizing NASCET criteria, using the distal internal carotid diameter as the denominator. CONTRAST:  110 cc of Isovue 370. COMPARISON:  Prior head CT from 04/28/16. FINDINGS: CTA NECK FINDINGS Aortic arch: Visualized aortic arch of normal caliber with normal 3 vessel morphology. Mild atheromatous plaque within the arch itself. No high-grade stenosis about the origin of the great vessels. Focal kinking with atheromatous plaque at the proximal right  subclavian artery just be on the origin of the right common carotid artery. Secondary short-segment stenosis of approximately 70-80% (series 7, image 326). Subclavian arteries otherwise widely patent. Right carotid system: Right common carotid artery tortuous proximally but widely patent to the bifurcation. Atheromatous plaque about the right bifurcation/proximal right ICA. Associated multifocal irregularity with stenosis of up to 40-50% by NASCET criteria. Superimposed small penetrating plaque noted within this region (series 7, image 227). Distally, right ICA widely patent to the skullbase. No made of atheromatous plaque at the origin of the right external carotid artery without flow-limiting stenosis. Left carotid system: Left common carotid artery patent from its origin to the bifurcation. Eccentric calcified plaque about the left bifurcation/proximal left ICA with associated narrowing of approximately 30% by NASCET criteria. There is a irregular curvilinear filling defect within the proximal left ICA, likely a small dissection flap (series  7, image 210). No associated flow-limiting stenosis or significant intraluminal thrombus. This is of unknown chronicity. Distally, left ICA widely patent to the skullbase without additional stenosis. Vertebral arteries: Both of the vertebral arteries arise from the subclavian arteries. Vertebral arteries somewhat poorly evaluated proximally due to body habitus. Vertebral arteries patent within the neck without flow-limiting stenosis, dissection, or occlusion. Skeleton: No acute osseous abnormality. No worrisome lytic or blastic osseous lesions. Other neck: No acute soft tissue abnormality within the neck. No adenopathy. Upper chest: Visualized upper mediastinum within normal limits. Visualized lung apices are clear. Review of the MIP images confirms the above findings CTA HEAD FINDINGS Anterior circulation: Petrous segments widely patent bilaterally. Scattered atheromatous plaque within the cavernous ICAs without flow-limiting stenosis. A1 segments patent. Anterior communicating artery patent. Anterior cerebral arteries demonstrate mild multifocal atheromatous irregularity but are patent to their distal aspects. M1 segments widely patent without stenosis or occlusion. MCA bifurcations normal. Distal MCA branches well opacified and symmetric. Mild distal small vessel atheromatous irregularity. Posterior circulation: Vertebral arteries patent to the vertebrobasilar junction without flow-limiting stenosis. Posterior inferior cerebral arteries patent proximally. Basilar artery widely patent without stenosis or occlusion. Superior cerebral arteries patent bilaterally. Left PCA supplied via the basilar and is well opacified to its distal aspect. Fetal type right PCA supplied via a widely patent right P com. Right PCA also patent to its distal aspect. Mild distal small vessel atheromatous irregularity. Venous sinuses: Patent. Anatomic variants: Fetal type right PCA. No aneurysm or vascular malformation. Delayed phase: No  pathologic enhancement. Review of the MIP images confirms the above findings IMPRESSION: CTA NECK IMPRESSION: 1. 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 Finding is of unknown chronicity. 2. 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 3. 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 4. Widely patent vertebral arteries within the neck. CTA HEAD IMPRESSION: 1. Negative CTA of the intracranial circulation for large vessel occlusion or high-grade correctable stenosis. 2. Mild distal small vessel atheromatous irregularity involving both the anterior and posterior circulations. 3. Atheromatous plaque within the carotid siphons without significant stenosis. Electronically Signed   By: Rise Mu M.D.   On: 04/29/2016 04:28   Dg Chest 1 View  Result Date: 04/28/2016 CLINICAL DATA:  Syncopal episode while shaving this morning. LEFT arm pain. History of recent humerus fracture. EXAM: CHEST 1 VIEW COMPARISON:  Chest radiograph March 21, 2016 FINDINGS: Cardiac silhouette is upper limits of normal in size. Mediastinal silhouette is nonsuspicious, mildly calcified aortic knob. Persistently elevated RIGHT  hemidiaphragm with bibasilar strandy densities. Rounded density projects in RIGHT lung apex. No pneumothorax. Soft tissue planes and included osseous structures are nonsuspicious. IMPRESSION: Rounded density projecting RIGHT lung apex could represent EEG lead sticker, less likely consolidation/pneumonia. Persistently elevated RIGHT hemidiaphragm with bibasilar atelectasis. Borderline cardiomegaly. Electronically Signed   By: Awilda Metro M.D.   On: 04/28/2016 13:29   Ct Head Wo Contrast  Result Date: 04/28/2016 CLINICAL DATA:  Trauma, fall while walking dog EXAM: CT HEAD WITHOUT CONTRAST CT CERVICAL SPINE WITHOUT CONTRAST TECHNIQUE: Multidetector CT  imaging of the head and cervical spine was performed following the standard protocol without intravenous contrast. Multiplanar CT image reconstructions of the cervical spine were also generated. COMPARISON:  CT head dated 03/19/2016. CT head/ cervical spine dated 03/16/2016. FINDINGS: CT HEAD FINDINGS Brain: No evidence of acute infarction, hemorrhage, hydrocephalus, extra-axial collection or mass lesion/mass effect. Subcortical white matter and periventricular small vessel ischemic changes. Global cortical atrophy. Vascular: Mild intracranial atherosclerosis. Skull: Normal. Negative for fracture or focal lesion. Sinuses/Orbits: Mild partial opacification of the right maxillary sinus. Visualized paranasal sinuses and mastoid air cells are otherwise clear. Other: None. CT CERVICAL SPINE FINDINGS Alignment: Normal cervical lordosis. Skull base and vertebrae: No acute fracture. No primary bone lesion or focal pathologic process. Soft tissues and spinal canal: No prevertebral fluid or swelling. No visible canal hematoma. Disc levels: Mild degenerative changes. Spinal canal remains patent. Upper chest: Visualized lung apices are essentially clear. Other: Visualized thyroid is unremarkable. IMPRESSION: No evidence of acute intracranial abnormality. Atrophy with small vessel ischemic changes. No evidence of traumatic injury to the cervical spine. Mild degenerative changes. Electronically Signed   By: Charline Bills M.D.   On: 04/28/2016 13:16   Ct Angio Neck W Or Wo Contrast  Result Date: 04/29/2016 CLINICAL DATA:  Initial evaluation for acute syncope. EXAM: CT ANGIOGRAPHY HEAD AND NECK TECHNIQUE: Multidetector CT imaging of the head and neck was performed using the standard protocol during bolus administration of intravenous contrast. Multiplanar CT image reconstructions and MIPs were obtained to evaluate the vascular anatomy. Carotid stenosis measurements (when applicable) are obtained utilizing NASCET criteria,  using the distal internal carotid diameter as the denominator. CONTRAST:  110 cc of Isovue 370. COMPARISON:  Prior head CT from 04/28/16. FINDINGS: CTA NECK FINDINGS Aortic arch: Visualized aortic arch of normal caliber with normal 3 vessel morphology. Mild atheromatous plaque within the arch itself. No high-grade stenosis about the origin of the great vessels. Focal kinking with atheromatous plaque at the proximal right subclavian artery just be on the origin of the right common carotid artery. Secondary short-segment stenosis of approximately 70-80% (series 7, image 326). Subclavian arteries otherwise widely patent. Right carotid system: Right common carotid artery tortuous proximally but widely patent to the bifurcation. Atheromatous plaque about the right bifurcation/proximal right ICA. Associated multifocal irregularity with stenosis of up to 40-50% by NASCET criteria. Superimposed small penetrating plaque noted within this region (series 7, image 227). Distally, right ICA widely patent to the skullbase. No made of atheromatous plaque at the origin of the right external carotid artery without flow-limiting stenosis. Left carotid system: Left common carotid artery patent from its origin to the bifurcation. Eccentric calcified plaque about the left bifurcation/proximal left ICA with associated narrowing of approximately 30% by NASCET criteria. There is a irregular curvilinear filling defect within the proximal left ICA, likely a small dissection flap (series 7, image 210). No associated flow-limiting stenosis or significant intraluminal thrombus. This is of unknown chronicity. Distally, left ICA  widely patent to the skullbase without additional stenosis. Vertebral arteries: Both of the vertebral arteries arise from the subclavian arteries. Vertebral arteries somewhat poorly evaluated proximally due to body habitus. Vertebral arteries patent within the neck without flow-limiting stenosis, dissection, or occlusion.  Skeleton: No acute osseous abnormality. No worrisome lytic or blastic osseous lesions. Other neck: No acute soft tissue abnormality within the neck. No adenopathy. Upper chest: Visualized upper mediastinum within normal limits. Visualized lung apices are clear. Review of the MIP images confirms the above findings CTA HEAD FINDINGS Anterior circulation: Petrous segments widely patent bilaterally. Scattered atheromatous plaque within the cavernous ICAs without flow-limiting stenosis. A1 segments patent. Anterior communicating artery patent. Anterior cerebral arteries demonstrate mild multifocal atheromatous irregularity but are patent to their distal aspects. M1 segments widely patent without stenosis or occlusion. MCA bifurcations normal. Distal MCA branches well opacified and symmetric. Mild distal small vessel atheromatous irregularity. Posterior circulation: Vertebral arteries patent to the vertebrobasilar junction without flow-limiting stenosis. Posterior inferior cerebral arteries patent proximally. Basilar artery widely patent without stenosis or occlusion. Superior cerebral arteries patent bilaterally. Left PCA supplied via the basilar and is well opacified to its distal aspect. Fetal type right PCA supplied via a widely patent right P com. Right PCA also patent to its distal aspect. Mild distal small vessel atheromatous irregularity. Venous sinuses: Patent. Anatomic variants: Fetal type right PCA. No aneurysm or vascular malformation. Delayed phase: No pathologic enhancement. Review of the MIP images confirms the above findings IMPRESSION: CTA NECK IMPRESSION: 1. 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 Finding is of unknown chronicity. 2. 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 3. 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 4. Widely patent vertebral  arteries within the neck. CTA HEAD IMPRESSION: 1. Negative CTA of the intracranial circulation for large vessel occlusion or high-grade correctable stenosis. 2. Mild distal small vessel atheromatous irregularity involving both the anterior and posterior circulations. 3. Atheromatous plaque within the carotid siphons without significant stenosis. Electronically Signed   By: Rise MuBenjamin  McClintock M.D.   On: 04/29/2016 04:28   Ct Angio Chest Pe W Or Wo Contrast  Result Date: 04/29/2016 CLINICAL DATA:  71 year old male with elevated D-dimer. EXAM: CT ANGIOGRAPHY CHEST WITH CONTRAST TECHNIQUE: Multidetector CT imaging of the chest was performed using the standard protocol during bolus administration of intravenous contrast. Multiplanar CT image reconstructions and MIPs were obtained to evaluate the vascular anatomy. CONTRAST:  100 cc Isovue 370 COMPARISON:  Chest radiograph dated 04/28/2016 FINDINGS: Cardiovascular: There is mild cardiomegaly. No pericardial effusion. Mild coronary vascular calcification predominantly involving the LAD. Mild atherosclerotic calcification of the thoracic aorta. No aneurysmal dilatation or evidence of dissection. The origins of the great vessels of the aortic arch appear patent. Evaluation of the pulmonary arteries is limited due to suboptimal opacification of the distal branches. No large central pulmonary artery embolus identified. Mediastinum/Nodes: There is no hilar or mediastinal adenopathy. The esophagus is grossly unremarkable. Lungs/Pleura: Right lung base subsegmental atelectatic changes noted. Infiltrate is less likely but not excluded. Clinical correlation is recommended. There is no pleural effusion or pneumothorax. The central airways are patent. Upper Abdomen: There is diffuse fatty infiltration of the liver. Slight irregularity of the hepatic contour concerning for cirrhosis. There is mild eventration of the right hemidiaphragm. Musculoskeletal: Mild degenerative changes  of the spine. No acute fracture. Review of the MIP images confirms the above findings.  IMPRESSION: No CT evidence of central pulmonary artery embolus. Mild elevation of the right hemidiaphragm with right lung base subsegmental atelectasis/ scarring. Infiltrate is less likely. Clinical correlation is recommended. Electronically Signed   By: Elgie Collard M.D.   On: 04/29/2016 04:17   Ct Cervical Spine Wo Contrast  Result Date: 04/28/2016 CLINICAL DATA:  Trauma, fall while walking dog EXAM: CT HEAD WITHOUT CONTRAST CT CERVICAL SPINE WITHOUT CONTRAST TECHNIQUE: Multidetector CT imaging of the head and cervical spine was performed following the standard protocol without intravenous contrast. Multiplanar CT image reconstructions of the cervical spine were also generated. COMPARISON:  CT head dated 03/19/2016. CT head/ cervical spine dated 03/16/2016. FINDINGS: CT HEAD FINDINGS Brain: No evidence of acute infarction, hemorrhage, hydrocephalus, extra-axial collection or mass lesion/mass effect. Subcortical white matter and periventricular small vessel ischemic changes. Global cortical atrophy. Vascular: Mild intracranial atherosclerosis. Skull: Normal. Negative for fracture or focal lesion. Sinuses/Orbits: Mild partial opacification of the right maxillary sinus. Visualized paranasal sinuses and mastoid air cells are otherwise clear. Other: None. CT CERVICAL SPINE FINDINGS Alignment: Normal cervical lordosis. Skull base and vertebrae: No acute fracture. No primary bone lesion or focal pathologic process. Soft tissues and spinal canal: No prevertebral fluid or swelling. No visible canal hematoma. Disc levels: Mild degenerative changes. Spinal canal remains patent. Upper chest: Visualized lung apices are essentially clear. Other: Visualized thyroid is unremarkable. IMPRESSION: No evidence of acute intracranial abnormality. Atrophy with small vessel ischemic changes. No evidence of traumatic injury to the cervical  spine. Mild degenerative changes. Electronically Signed   By: Charline Bills M.D.   On: 04/28/2016 13:16   Dg Humerus Left  Result Date: 04/28/2016 CLINICAL DATA:  Syncopal episode while shaving this morning. LEFT arm pain. History of recent humerus fracture. EXAM: LEFT HUMERUS - 2+ VIEW COMPARISON:  LEFT humerus fracture March 20, 2016 FINDINGS: Healing displaced proximal humerus fracture with callus, rarefaction of the fracture line. No new fracture deformity. No dislocation. Distal humerus in fiberglass cast. No destructive bony lesions. Soft tissue planes are nonsuspicious. IMPRESSION: Healing displaced proximal LEFT humerus fracture ; no new fracture deformity or dislocation. Electronically Signed   By: Awilda Metro M.D.   On: 04/28/2016 13:31        Scheduled Meds: . ALPRAZolam  0.5 mg Oral QHS  . apixaban  5 mg Oral BID  . atorvastatin  40 mg Oral q1800  . buPROPion  450 mg Oral Daily  . furosemide  40 mg Oral Daily  . gabapentin  800 mg Oral TID  . iopamidol      . metoprolol tartrate  25 mg Oral BID  . sertraline  150 mg Oral Daily  . sodium chloride flush  3 mL Intravenous Q12H   Continuous Infusions: . sodium chloride    . sodium chloride 100 mL/hr at 04/28/16 2237     LOS: 0 days    Time spent: 35 minutes.     Alba Cory, MD [facility] Pager [phone]  If 7PM-7AM, please contact night-coverage [email or web address] Password [email or web address] 04/29/2016, 7:50 AM

## 2016-04-29 NOTE — Consult Note (Signed)
[facility] [place]  Patient name: Jorge [name] MRN: [id number] DOB: [birth year] Sex: male  REASON FOR CONSULT: left ICA dissection, consult is from Dr. Sunnie Nielsen  HPI: Jorge [name] is a 71 y.o. male, who present with syncopal episode yesterday. The patient was shaving yesterday morning and then noticed "shining lights" in both eyes and lost consciousness. He fell into the bathtub and hit his head. Once he woke up, he was able to get out of the tub, but unable to get help. When his meals on wheels arrived for lunch, EMS was called.   The patient is known to [place] from having undergone left carotid endarterectomy in 2004 by Dr. Darrick Penna for stroke. The patient reported also having a post-operative stroke while in the hospital. In February 2016, the patient presented with left-sided weakness and loss of consciousness. His MRI was negative for acute stroke. He underwent a cerebral angiogram that revealed less than 50% right internal carotid artery stenosis and luminal irregularity at the distal left carotid patch. There was no significant vertebral artery stenosis. He has residual weakness on his right half of the body from prior CVA. He reports some weakness of his left leg but no sudden weakness or numbness. He denies any recent slurred speech or amaurosis fugax.   Two to three months ago the patient was walking his dog and his "legs gave out" and he fell and broke his left arm. His ambulation is limited by shortness of breath. He is able to walk 1/2 a block before having to stop secondary to dyspnea. He has also had chest pain recently with exertion.   His past medical history includes CHF, AS, PAF on Eliquis, hypertension managed on BB, hyperlipidemia managed on a statin, and peripheral neuropathy on gabapentin. He is a former smoker quitting 20 years ago.   Past Medical History:  Diagnosis Date  . Chest pain   . CHF (congestive heart failure) (HCC)   . Edema   . HTN  (hypertension)   . Hyperlipidemia   . Neuropathy (HCC)   . Obesity   . SOB (shortness of breath)   . Stroke [facility])     Family History  Problem Relation Age of Onset  . Heart attack Mother   . Stroke Father     SOCIAL HISTORY: Social History   Social History  . Marital status: Divorced    Spouse name: N/A  . Number of children: N/A  . Years of education: N/A   Occupational History  . Not on file.   Social History Main Topics  . Smoking status: Former Games developer  . Smokeless tobacco: Former Neurosurgeon    Quit date: 03/22/1996  . Alcohol use No  . Drug use: No  . Sexual activity: Not on file   Other Topics Concern  . Not on file   Social History Narrative  . No narrative on file    Allergies  Allergen Reactions  . Coumadin [Warfarin Sodium] Rash    Current Facility-Administered Medications  Medication Dose Route Frequency Provider Last Rate Last Dose  . 0.9 %  sodium chloride infusion   Intravenous Continuous Belkys A Regalado, MD 100 mL/hr at 04/29/16 1030    . acetaminophen (TYLENOL) tablet 650 mg  650 mg Oral Q6H PRN Marcos Eke, PA-C   650 mg at 04/29/16 4098   Or  . acetaminophen (TYLENOL) suppository 650 mg  650 mg Rectal Q6H PRN Marcos Eke, PA-C      .  ALPRAZolam Prudy Feeler(XANAX) tablet 0.5 mg  0.5 mg Oral QHS Marcos EkeSara E Wertman, PA-C   0.5 mg at 04/28/16 2150  . alum & mag hydroxide-simeth (MAALOX/MYLANTA) 200-200-20 MG/5ML suspension 30 mL  30 mL Oral Q6H PRN Marcos EkeSara E Wertman, PA-C      . atorvastatin (LIPITOR) tablet 40 mg  40 mg Oral q1800 Marcos EkeSara E Wertman, PA-C      . buPROPion [facility](WELLBUTRIN SR) 12 hr tablet 450 mg  450 mg Oral Daily Marcos EkeSara E Wertman, PA-C   450 mg at 04/29/16 1043  . ceFEPIme (MAXIPIME) 2 g in dextrose 5 % 50 mL IVPB  2 g Intravenous Q12H Marquita Palmsorey M Ball, RPH   2 g at 04/29/16 1041  . gabapentin (NEURONTIN) capsule 800 mg  800 mg Oral TID Marcos EkeSara E Wertman, PA-C   800 mg at 04/29/16 1044  . heparin ADULT infusion 100 units/mL (25000 units/23150mL sodium chloride  0.45%)  750 Units/hr Intravenous Continuous Marquita PalmsCorey M Ball, RPH 7.5 mL/hr at 04/29/16 1033 750 Units/hr at 04/29/16 1033  . iopamidol (ISOVUE-370) 76 % injection           . ondansetron (ZOFRAN) tablet 4 mg  4 mg Oral Q6H PRN Marcos EkeSara E Wertman, PA-C       Or  . ondansetron [facility](ZOFRAN) injection 4 mg  4 mg Intravenous Q6H PRN Marcos EkeSara E Wertman, PA-C   4 mg at 04/28/16 2236  . senna-docusate (Senokot-S) tablet 1 tablet  1 tablet Oral QHS PRN Marcos EkeSara E Wertman, PA-C      . sertraline (ZOLOFT) tablet 150 mg  150 mg Oral Daily Marcos EkeSara E Wertman, PA-C   150 mg at 04/29/16 1043  . sodium chloride flush (NS) 0.9 % injection 3 mL  3 mL Intravenous Q12H Marcos EkeSara E Wertman, PA-C   3 mL at 04/29/16 1045  . vancomycin (VANCOCIN) IVPB 1000 mg/200 mL premix  1,000 mg Intravenous Once Marquita PalmsCorey M Ball, RPH   1,000 mg at 04/29/16 1043  . vancomycin (VANCOCIN) IVPB 750 mg/150 ml premix  750 mg Intravenous Q12H Marquita Palmsorey M Ball, RPH        REVIEW OF SYSTEMS:  [X]  denotes positive finding, [ ]  denotes negative finding Cardiac  Comments:  Chest pain or chest pressure: x   Shortness of breath upon exertion: x   Short of breath when lying flat:    Irregular heart rhythm: x       Vascular    Pain in calf, thigh, or hip brought on by ambulation:    Pain in feet at night that wakes you up from your sleep:     Blood clot in your veins:    Leg swelling:         Pulmonary    Oxygen at home:    Productive cough:     Wheezing:         Neurologic    Sudden weakness in arms or legs:     Sudden numbness in arms or legs:     Sudden onset of difficulty speaking or slurred speech:    Temporary loss of vision in one eye:     Problems with dizziness:  x       Gastrointestinal    Blood in stool:     Vomited blood:         Genitourinary    Burning when urinating:     Blood in urine:        Psychiatric    Major depression:  Hematologic    Bleeding problems:    Problems with blood clotting too easily:        Skin    Rashes or  ulcers:        Constitutional    Fever or chills:      PHYSICAL EXAM: Vitals:   04/29/16 0922 04/29/16 0933 04/29/16 1015 04/29/16 1022  BP: 90/60 (!) 89/52 (!) 87/45 (!) 95/55  Pulse: 71  71 70  Resp:      Temp:      TempSrc:      SpO2: 98%  99% 99%  Weight:      Height:        GENERAL: The patient is an ill-appearing morbidly obese male, in no acute distress. The vital signs are documented above. CARDIAC: There is a regular rate and rhythm. Harsh systolic murmur. Bruits heard in neck but likely radiation from AS.  VASCULAR: 2+ radial and dorsalis pedis pulses.  PULMONARY: Lungs clear, unlabored respiratory effort.  MUSCULOSKELETAL: There are no major deformities or cyanosis. NEUROLOGIC: 4/5 right upper and lower extremities. 5/5 left upper and lower extremities. No other focal deficits.  SKIN: There are no ulcers or rashes noted. PSYCHIATRIC: The patient has a normal affect.  DATA:  CTA Head/Neck 04/29/16  IMPRESSION: CTA NECK IMPRESSION:  1. 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 Finding is of unknown chronicity. 2. 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 3. 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 4. Widely patent vertebral arteries within the neck.  CTA HEAD IMPRESSION:  1. Negative CTA of the intracranial circulation for large vessel occlusion or high-grade correctable stenosis. 2. Mild distal small vessel atheromatous irregularity involving both the anterior and posterior circulations. 3. Atheromatous plaque within the carotid siphons without significant stenosis.   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 Darrick Penna. This was done for symptomatic left carotid stenosis. He had left sided weakness of loss of  consciousness in February 2016 and underwent a cerebral angiogram that revealed approximately 50% right ICA stenosis and some possible laminated clot at the distal left carotid patch. There was no left ICA pseudoaneurysm. Vertebral arteries without significant disease. His CTA today is concerning for possible proximal left internal carotid artery dissection that is non-flow limiting. He has not had any TIA or stroke symptoms. He is currently on heparin. Is on Eliquis at home for atrial fibrillation.  Dr. Myra Gianotti to see patient and make recommendations.   Syncope  His vertebral arteries are patent bilaterally and not cause for his syncope. Carotid artery disease unlikely etiology. He is undergoing cardiac cath tomorrow for further syncopal work-up given ischemia, history of arrhythmia and AS.   Other active problems: NSTEMI, aortic stenosis, PAF, HTN, HL, hx of CVA    Maris Berger, PA-C [facility] [place] [phone]   I agree with the above.  I have seen and examined the patient.  He presented with syncope.  His neck CTA reveals a non-flow limiting left ICA dissection of unknown chronicity.  I doubt this is the cause of his syncope.  He is already on anticoagulation for AFIB.  No acute intervention is needed.  He is scheduled for cardiac cath tomorrow.  We will continue to follow.  Durene Cal

## 2016-04-29 NOTE — Evaluation (Signed)
Occupational Therapy Evaluation Patient Details Name: Jorge [name] MRN: [id number] DOB: [birth year] Today's Date: 04/29/2016    History of Present Illness Jorge [name] is a 71 y.o. male  who presents with syncope. Cardiac vs neck/intracranial vascular vs PE. Pt w/ h/o falls including fall ~1 month ago resulting in LUE fracture - pt with cast and then a fall at home after "blacking out" & bumping his head in bathroom after shaving on day of admit.   Clinical Impression   Pt admitted as per above & currently requires Max A + 2 for LB ADL's and self care tasks at bed level. Pt BP is low thereofre no OOB activity performed at this time. Pt was educated in role of OT and he participated in grooming, toileting session at bedlevel this am. He lives alone and should benefit from SNF Rehab prior to retun home to I living facility (see OT Problem list below).    Follow Up Recommendations  SNF;Supervision/Assistance - 24 hour    Equipment Recommendations  Other (comment) (Defer to next venue)    Recommendations for Other Services       Precautions / Restrictions Precautions Precautions: Fall Precaution Comments: H/O falls (1x ~1 month ago - has cast on LUE and another day of this admit, fell at home in bathroom and "blacked out and hit head); uses cane when outside not in house Restrictions Weight Bearing Restrictions: No      Mobility Bed Mobility Overal bed mobility: Needs Assistance Bed Mobility: Rolling Rolling: +2 for physical assistance;Mod assist;Max assist            Transfers Overall transfer level:  (Deferred OOB activity 2* low BP today supine. Pt getting fluids )               General transfer comment: TBA    Balance Overall balance assessment: History of Falls                                          ADL Overall ADL's : Needs assistance/impaired Eating/Feeding: Set up;Bed level   Grooming: Wash/dry hands;Wash/dry face;Oral  care;Set up;Bed level   Upper Body Bathing: Set up;Bed level;Minimal assistance   Lower Body Bathing: Set up;Maximal assistance;Bed level;+2 for physical assistance Lower Body Bathing Details (indicate cue type and reason): Rolls in bed  Upper Body Dressing : Minimal assistance;Bed level   Lower Body Dressing: Maximal assistance;Bed level;+2 for physical assistance   Toilet Transfer: Maximal assistance;+2 for physical assistance Toilet Transfer Details (indicate cue type and reason): Pt using bed pan secondary to low BP - requires +2 assist to roll in bed after using bedpan Toileting- Clothing Manipulation and Hygiene: Maximal assistance;+2 for physical assistance;Bed level     Tub/Shower Transfer Details (indicate cue type and reason): NT Functional mobility during ADLs:  (OOB activity deferred today secondary to low BP - no OOB during this assessment) General ADL Comments: Pt is currently requiring Max A + 2 for LB ADL's and self care tasks at bed level. Pt BP is low thereofre no OOB activity performed at this time. Pt was educated in role of OT and he participated in grooming, toileting session at bedlevel this am. He lives alone and should benefit from SNF Rehab prior to retun home to I living facility     Vision  Wears glasses at all times. No change from baseline.  Perception     Praxis      Pertinent Vitals/Pain Pain Assessment: No/denies pain     Hand Dominance Left   Extremity/Trunk Assessment Upper Extremity Assessment Upper Extremity Assessment: LUE deficits/detail LUE Deficits / Details: LUE fracture and in a cast s/p fall ~1 month ago at home. Pt is L HD  LUE: Unable to fully assess due to immobilization   Lower Extremity Assessment Lower Extremity Assessment: Defer to PT evaluation       Communication Communication Communication: No difficulties   Cognition Arousal/Alertness: Lethargic (Falling asleep off and on during assessment) Behavior During  Therapy: WFL for tasks assessed/performed Overall Cognitive Status: Within Functional Limits for tasks assessed                     General Comments       Exercises       Shoulder Instructions      Home Living Family/patient expects to be discharged to:: Private residence Living Arrangements: Alone   Type of Home: Independent living facility Home Access: Level entry     Home Layout: One level     Bathroom Shower/Tub: Chief Strategy OfficerTub/shower unit   Bathroom Toilet: Standard Bathroom Accessibility: Yes   Home Equipment: Cane - single point;Walker - 4 wheels;Grab bars - tub/shower;Hand held shower head;Tub bench   Additional Comments: Has toilet riser but has not had it installed yet      Prior Functioning/Environment Level of Independence: Needs assistance  Gait / Transfers Assistance Needed: uses cane outside of apartment ADL's / Homemaking Assistance Needed: Does own bathing and dressing; reports difficulty w/ "cleaning myself" since he fractured his arm. daughter in law gets grocersies for him            OT Problem List: Decreased strength;Obesity;Cardiopulmonary status limiting activity;Decreased activity tolerance;Decreased knowledge of use of DME or AE;Impaired UE functional use   OT Treatment/Interventions: Self-care/ADL training;Therapeutic exercise;Patient/family education;Energy conservation;Therapeutic activities;DME and/or AE instruction    OT Goals(Current goals can be found in the care plan section) Acute Rehab OT Goals Patient Stated Goal: Get better Time For Goal Achievement: 05/13/16 Potential to Achieve Goals: Good  OT Frequency: Min 2X/week   Barriers to D/C: Other (comment)  Lives alone; h/o falls, current LUE fracture 2* fall ~1 month ago. Pt is LHD       Co-evaluation              End of Session Equipment Utilized During Treatment: Oxygen;Other (comment) (Defer OOB activity 2* low BP; pt getting fluids.)  Activity Tolerance: Patient  limited by fatigue;Patient limited by lethargy Patient left: in bed;with call bell/phone within reach;with bed alarm set;Other (comment) (MD present in room)   Time: [phone] OT Time Calculation (min): 28 min Charges:  OT General Charges $OT Visit: 1 Procedure OT Evaluation $OT Eval Moderate Complexity: 1 Procedure OT Treatments $Self Care/Home Management : 8-22 mins G-Codes:    Jorge [name], Lyanna Blystone Beth Dixon, Jorge [name] 04/29/2016, 10:27 AM

## 2016-04-30 ENCOUNTER — Encounter (HOSPITAL_COMMUNITY): Payer: Self-pay | Admitting: *Deleted

## 2016-04-30 ENCOUNTER — Encounter (HOSPITAL_COMMUNITY)
Admission: EM | Disposition: A | Discharge status: Home Health | Payer: Self-pay | Source: Home / Self Care | Attending: Internal Medicine

## 2016-04-30 DIAGNOSIS — Z9889 Other specified postprocedural states: Secondary | ICD-10-CM

## 2016-04-30 DIAGNOSIS — Z87891 Personal history of nicotine dependence: Secondary | ICD-10-CM

## 2016-04-30 DIAGNOSIS — I2510 Atherosclerotic heart disease of native coronary artery without angina pectoris: Secondary | ICD-10-CM

## 2016-04-30 HISTORY — PX: CARDIAC CATHETERIZATION: SHX172

## 2016-04-30 LAB — HEPARIN LEVEL (UNFRACTIONATED)
HEPARIN UNFRACTIONATED: 0.51 [IU]/mL (ref 0.30–0.70)
Heparin Unfractionated: 1.58 IU/mL — ABNORMAL HIGH (ref 0.30–0.70)

## 2016-04-30 LAB — POCT I-STAT 3, ART BLOOD GAS (G3+)
BICARBONATE: 25.3 mmol/L (ref 20.0–28.0)
O2 SAT: 93 %
TCO2: 27 mmol/L (ref 0–100)
pCO2 arterial: 41.4 mmHg (ref 32.0–48.0)
pH, Arterial: 7.394 (ref 7.350–7.450)
pO2, Arterial: 68 mmHg — ABNORMAL LOW (ref 83.0–108.0)

## 2016-04-30 LAB — POCT I-STAT 3, VENOUS BLOOD GAS (G3P V)
ACID-BASE EXCESS: 2 mmol/L (ref 0.0–2.0)
Bicarbonate: 27.8 mmol/L (ref 20.0–28.0)
O2 SAT: 62 %
TCO2: 29 mmol/L (ref 0–100)
pCO2, Ven: 48 mmHg (ref 44.0–60.0)
pH, Ven: 7.371 (ref 7.250–7.430)
pO2, Ven: 34 mmHg (ref 32.0–45.0)

## 2016-04-30 LAB — TROPONIN I: Troponin I: <0.03 ng/mL (ref <0.03)

## 2016-04-30 LAB — URINE CULTURE: Culture: NO GROWTH

## 2016-04-30 LAB — POCT ACTIVATED CLOTTING TIME: Activated Clotting Time: 98 seconds

## 2016-04-30 LAB — APTT: APTT: 45 s — AB (ref 24–36)

## 2016-04-30 SURGERY — RIGHT/LEFT HEART CATH AND CORONARY ANGIOGRAPHY

## 2016-04-30 MED ORDER — MIDAZOLAM HCL 2 MG/2ML IJ SOLN
INTRAMUSCULAR | Status: AC
  Filled 2016-04-30: qty 2

## 2016-04-30 MED ORDER — SODIUM CHLORIDE 0.9 % IV SOLN: 250.0000 mL | INTRAVENOUS | Status: DC | PRN

## 2016-04-30 MED ORDER — FENTANYL CITRATE (PF) 100 MCG/2ML IJ SOLN
INTRAMUSCULAR | Status: DC | PRN
  Administered 2016-04-30: 25 ug via INTRAVENOUS
  Administered 2016-04-30: 50 ug via INTRAVENOUS
  Administered 2016-04-30: 25 ug via INTRAVENOUS

## 2016-04-30 MED ORDER — IOPAMIDOL (ISOVUE-370) INJECTION 76%
INTRAVENOUS | Status: DC | PRN
  Administered 2016-04-30: 145 mL via INTRA_ARTERIAL

## 2016-04-30 MED ORDER — FENTANYL CITRATE (PF) 100 MCG/2ML IJ SOLN
INTRAMUSCULAR | Status: AC
  Filled 2016-04-30: qty 2

## 2016-04-30 MED ORDER — SODIUM CHLORIDE 0.9% FLUSH
3.0000 mL | Freq: Two times a day (BID) | INTRAVENOUS | Status: DC
  Administered 2016-05-01 – 2016-05-05 (×7): 3 mL via INTRAVENOUS

## 2016-04-30 MED ORDER — SODIUM CHLORIDE 0.9 % IV SOLN: INTRAVENOUS | Status: AC

## 2016-04-30 MED ORDER — HEPARIN (PORCINE) IN NACL 2-0.9 UNIT/ML-% IJ SOLN
INTRAMUSCULAR | Status: DC | PRN
  Administered 2016-04-30: 1000 mL

## 2016-04-30 MED ORDER — SODIUM CHLORIDE 0.9% FLUSH: 3.0000 mL | INTRAVENOUS | Status: DC | PRN

## 2016-04-30 MED ORDER — LIDOCAINE HCL (PF) 1 % IJ SOLN
INTRAMUSCULAR | Status: DC | PRN
  Administered 2016-04-30: 25 mL via INTRADERMAL

## 2016-04-30 MED ORDER — IOPAMIDOL (ISOVUE-370) INJECTION 76%
INTRAVENOUS | Status: AC
  Filled 2016-04-30: qty 50

## 2016-04-30 MED ORDER — DILTIAZEM HCL 100 MG IV SOLR
5.0000 mg/h | INTRAVENOUS | Status: DC
  Administered 2016-05-01 – 2016-05-02 (×3): 5 mg/h via INTRAVENOUS
  Filled 2016-04-30 (×3): qty 100

## 2016-04-30 MED ORDER — MIDAZOLAM HCL 2 MG/2ML IJ SOLN
INTRAMUSCULAR | Status: DC | PRN
  Administered 2016-04-30 (×2): 1 mg via INTRAVENOUS

## 2016-04-30 MED ORDER — IOPAMIDOL (ISOVUE-370) INJECTION 76%
INTRAVENOUS | Status: AC
  Filled 2016-04-30: qty 100

## 2016-04-30 MED ORDER — NITROGLYCERIN 0.4 MG SL SUBL
SUBLINGUAL_TABLET | SUBLINGUAL | Status: AC
  Filled 2016-04-30: qty 1

## 2016-04-30 MED ORDER — HEPARIN BOLUS VIA INFUSION
4000.0000 [IU] | Freq: Once | INTRAVENOUS | Status: AC
  Administered 2016-04-30: 4000 [IU] via INTRAVENOUS
  Filled 2016-04-30: qty 4000

## 2016-04-30 MED ORDER — HEPARIN (PORCINE) IN NACL 2-0.9 UNIT/ML-% IJ SOLN
INTRAMUSCULAR | Status: AC
  Filled 2016-04-30: qty 1000

## 2016-04-30 MED ORDER — LIDOCAINE HCL (PF) 1 % IJ SOLN
INTRAMUSCULAR | Status: AC
  Filled 2016-04-30: qty 30

## 2016-04-30 SURGICAL SUPPLY — 18 items
CATH EXPO 5F MPA-1 (CATHETERS) ×3 IMPLANT
CATH INFINITI 5 FR 3DRC (CATHETERS) ×3 IMPLANT
CATH INFINITI 5FR AL1 (CATHETERS) ×3 IMPLANT
CATH INFINITI 5FR MULTPACK ANG (CATHETERS) ×3 IMPLANT
CATH SITESEER 5F NTR (CATHETERS) ×3 IMPLANT
CATH SWAN GANZ 7F STRAIGHT (CATHETERS) ×3 IMPLANT
GLIDESHEATH SLEND SS 6F .021 (SHEATH) ×3 IMPLANT
GUIDEWIRE INQWIRE 1.5J.035X260 (WIRE) ×1 IMPLANT
INQWIRE 1.5J .035X260CM (WIRE) ×3
KIT HEART LEFT (KITS) ×3 IMPLANT
KIT HEART RIGHT NAMIC (KITS) ×3 IMPLANT
PACK CARDIAC CATHETERIZATION (CUSTOM PROCEDURE TRAY) ×3 IMPLANT
SHEATH PINNACLE 5F 10CM (SHEATH) ×3 IMPLANT
SHEATH PINNACLE 7F 10CM (SHEATH) ×3 IMPLANT
TRANSDUCER W/STOPCOCK (MISCELLANEOUS) ×3 IMPLANT
TUBING CIL FLEX 10 FLL-RA (TUBING) ×3 IMPLANT
WIRE EMERALD 3MM-J .035X150CM (WIRE) ×3 IMPLANT
WIRE EMERALD ST .035X150CM (WIRE) ×3 IMPLANT

## 2016-04-30 NOTE — Progress Notes (Signed)
Pt c/o nausea and HA.  Pt also c/o reflux from not being able to eat due to cath procedure today.  Pt given tylenol for HA and zofran for nausea.  Will continue to monitor.

## 2016-04-30 NOTE — Progress Notes (Signed)
Lab called critical ptt greater than 200 seconds.  Pt stated lab drew blood from the right hand where heparin is infusing in right wrist.  Pharmacy called and new orders placed to redraw blood in opposite hand.

## 2016-04-30 NOTE — Progress Notes (Signed)
PROGRESS NOTE    Jorge DroughtGeorge O Steib  GEX:[id number] DOB: [birth year] DOA: 04/28/2016 PCP: Lonie PeakNathan Conroy, PA-C   Chief Complaint  Patient presents with  . Loss of Consciousness    Brief Narrative:  HPI on 04/28/2016 by Ms. Marlowe KaysSara Wertman, PA Jorge [name] is a 71 y.o. male with a history of paroxysmal atrial fibrillation on Eliquis , DCHF,  AS,  hypertension, hyperlipidemia, obey CT, prior history of stroke, carotid artery disease, brought to the ED after a syncopal episode. In review, she was shaving around 8:30 this morning, when he turned his head to the right, accompanied by some prodromal symptoms including nausea, and "shinning lights:, falling an hitting his head against the bathtub. He had lost consciousness for several minutes, but remained on the floor till 11 am, when he was found by his caretaker and EMS was called. Patient reported increased anxiety a the time, and complains of vague chest pain, without palpitations. He reports increasing shortness of breath without cough. He denies any jaw pain or left arm pain. He reports having similar dizzy spells over the last year but no presyncopal or syncopal events. No dysarthria or dysphagia. Denies any unilateral weakness or sensory deficiencies.  Denies any confusion. Of note, patient has trouble finding words since his last stroke. No seizures reported.  Denies any fever or chills, or night sweats. Denies any abdominal pain, or diarrhea. Denies any sick contacts or new foods. Denies any recent long distance trips. No recent surgeries. Denies worsening lower extremity swelling. Denies abnormal skin rashes, or neuropathy. Compliant with  medications. Does not smoke.  Assessment & Plan   Hypotension -possibly the cause of patient's syncope -Unclear etiology for hypotension.  -upon admission denied fever, cough, chest pain, UR symptoms, no abdominal pain. Had some diarrhea few days ago which has resolved.  -Cardiology consulted and  appreciated -Echocardiogram 04/29/2016: EF 55-60%, grade 1 diastolic dysfunction. Severe AS -Cefepime started empirically given that patient also had leukocytosis   Syncope  -D-Dimer 3.28, EKG unrevealing.  -CT head negative for acute intracranial abnormalities. -CT angio chest negative for central PE.  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  Widely patent vertebral arteries within the neck. There is a irregular curvilinear filling defect within the proximal left ICA, likely a small dissection flap (series 7, image 210). -Due to abnormal CT angio neck, vascular surgery consulted and appreciated  ?Left ICA dissection -CTA neck as above -Vascular surgery consulted and did not recommend further acute intervention at this time. Patient already on anticoagulation  -s/p L CEA 2014- follows with [name]   Elevated troponin/NSTEMI -Echo as above -Pending cardiac cath today  Hypertension  -Hold BP medications as patient was hypotensive.   Paroxysmal Atrial Fibrillation  -CHA2DS2-VASc score 6  -Continue anticoagulation, currently Eliquis held due to Cath.  On heparin  Hyperlipidemia -Continue statin  Peripheral Neuropathy -Continue Neurontin   Depression -Continue home Zoloft   Generalized weakness -PT and OT consulted  DVT Prophylaxis  Heparin  Code Status: Full  Family Communication: none at bedside  Disposition Plan: Admitted. Pending cardiac cath today.   Consultants Cardiology Vascular surgery   Procedures  Echocardiogram  Antibiotics   Anti-infectives    Start     Dose/Rate Route Frequency Ordered Stop   04/29/16 2200  vancomycin (VANCOCIN) IVPB 750 mg/150 ml  premix     750 mg 150 mL/hr over 60 Minutes Intravenous Every 12 hours 04/29/16 0939      04/29/16 0930  ceFEPIme (MAXIPIME) 2 g in dextrose 5 % 50 mL IVPB     2 g 100 mL/hr over 30 Minutes Intravenous Every 12 hours 04/29/16 0859     04/29/16 0930  vancomycin (VANCOCIN) IVPB 1000 mg/200 mL premix     1,000 mg 200 mL/hr over 60 Minutes Intravenous  Once 04/29/16 0859 04/29/16 1143      Subjective:   Dorinda Hill seen and examined today. Patient denies chest pain, shortness of breath. Does have some dizziness. Denies abdominal pain, nausea or vomiting, diarrhea or constipation. Patient is afraid of going home as he lives alone and feels there is no one to help take care of him.    Objective:   Vitals:   04/30/16 0021 04/30/16 0632 04/30/16 0839 04/30/16 1125  BP: (!) 120/50 (!) 94/59 (!) 99/50 130/67  Pulse: 88 85 75 79  Resp: 18 20 18    Temp: 97.9 F (36.6 C) 98 F (36.7 C) 98.1 F (36.7 C)   TempSrc: Oral Oral Oral   SpO2: 99% 95% 93%   Weight:  (!) 143.8 kg (317 lb)    Height:        Intake/Output Summary (Last 24 hours) at 04/30/16 1359 Last data filed at 04/30/16 1351  Gross per 24 hour  Intake           566.14 ml  Output             4350 ml  Net         -3783.86 ml   Filed Weights   04/29/16 0618 04/29/16 1245 04/30/16 0632  Weight: 66 kg (145 lb 6.4 oz) (!) 146.8 kg (323 lb 11.2 oz) (!) 143.8 kg (317 lb)    Exam  General: Well developed, well nourished, NAD, appears stated age  HEENT: NCAT,mucous membranes moist.   Neck: Supple, no JVD, no masses. Left carotid bruit  Cardiovascular: S1 S2 auscultated,3/6SEM-harsh, Regular rate and rhythm.  Respiratory: Clear to auscultation bilaterally with equal chest rise  Abdomen: Soft, obese, nontender, nondistended, + bowel sounds  Extremities: warm dry without cyanosis clubbing or edema. LUE in cast  Neuro: AAOx3, nonfocal  Psych: Normal affect and demeanor    Data Reviewed: I have personally reviewed following labs and imaging studies  CBC:  Recent Labs Lab 04/28/16 1249  04/29/16 0856  WBC 14.2* 9.7  HGB 13.7 12.8*  HCT 42.8 40.1  MCV 94.5 95.0  PLT 306 309   Basic Metabolic Panel:  Recent Labs Lab 04/28/16 1209 04/29/16 1145  NA 138 138  K 3.7 3.7  CL 103 107  CO2 26 24  GLUCOSE 132* 141*  BUN 11 11  CREATININE 0.99 1.01  CALCIUM 8.9 8.2*   GFR: Estimated Creatinine Clearance: 102.8 mL/min (by C-G formula based on SCr of 1.01 mg/dL). Liver Function Tests:  Recent Labs Lab 04/28/16 1209  AST 21  ALT 17  ALKPHOS 219*  BILITOT 0.5  PROT 7.3  ALBUMIN 3.8   No results for input(s): LIPASE, AMYLASE in the last 168 hours. No results for input(s): AMMONIA in the last 168 hours. Coagulation Profile: No results for input(s): INR, PROTIME in the last 168 hours. Cardiac Enzymes:  Recent Labs Lab 04/28/16 1249  04/29/16 1145 04/29/16 1653 04/29/16 2325 04/30/16 0547 04/30/16 1104  CKTOTAL 148  --   --   --   --   --   --  TROPONINI  --   < > <0.03 <0.03 <0.03 <0.03 <0.03  < > = values in this interval not displayed. BNP (last 3 results) No results for input(s): PROBNP in the last 8760 hours. HbA1C: No results for input(s): HGBA1C in the last 72 hours. CBG:  Recent Labs Lab 04/28/16 1235 04/29/16 0616  GLUCAP 136* 121*   Lipid Profile: No results for input(s): CHOL, HDL, LDLCALC, TRIG, CHOLHDL, LDLDIRECT in the last 72 hours. Thyroid Function Tests: No results for input(s): TSH, T4TOTAL, FREET4, T3FREE, THYROIDAB in the last 72 hours. Anemia Panel: No results for input(s): VITAMINB12, FOLATE, FERRITIN, TIBC, IRON, RETICCTPCT in the last 72 hours. Urine analysis:    Component Value Date/Time   COLORURINE YELLOW 04/29/2016 1233   APPEARANCEUR CLEAR 04/29/2016 1233   LABSPEC 1.021 04/29/2016 1233   PHURINE 6.0 04/29/2016 1233   GLUCOSEU NEGATIVE 04/29/2016 1233   HGBUR SMALL (A) 04/29/2016 1233   BILIRUBINUR NEGATIVE 04/29/2016 1233   KETONESUR NEGATIVE 04/29/2016 1233   PROTEINUR NEGATIVE 04/29/2016 1233   NITRITE  NEGATIVE 04/29/2016 1233   LEUKOCYTESUR NEGATIVE 04/29/2016 1233   Sepsis Labs: @LABRCNTIP (procalcitonin:4,lacticidven:4)  ) Recent Results (from the past 240 hour(s))  Urine culture     Status: None   Collection Time: 04/29/16 12:33 PM  Result Value Ref Range Status   Specimen Description URINE, CLEAN CATCH  Final   Special Requests NONE  Final   Culture NO GROWTH  Final   Report Status 04/30/2016 FINAL  Final      Radiology Studies: Ct Angio Head W Or Wo Contrast  Result Date: 04/29/2016 CLINICAL DATA:  Initial evaluation for acute syncope. EXAM: CT ANGIOGRAPHY HEAD AND NECK TECHNIQUE: Multidetector CT imaging of the head and neck was performed using the standard protocol during bolus administration of intravenous contrast. Multiplanar CT image reconstructions and MIPs were obtained to evaluate the vascular anatomy. Carotid stenosis measurements (when applicable) are obtained utilizing NASCET criteria, using the distal internal carotid diameter as the denominator. CONTRAST:  110 cc of Isovue 370. COMPARISON:  Prior head CT from 04/28/16. FINDINGS: CTA NECK FINDINGS Aortic arch: Visualized aortic arch of normal caliber with normal 3 vessel morphology. Mild atheromatous plaque within the arch itself. No high-grade stenosis about the origin of the great vessels. Focal kinking with atheromatous plaque at the proximal right subclavian artery just be on the origin of the right common carotid artery. Secondary short-segment stenosis of approximately 70-80% (series 7, image 326). Subclavian arteries otherwise widely patent. Right carotid system: Right common carotid artery tortuous proximally but widely patent to the bifurcation. Atheromatous plaque about the right bifurcation/proximal right ICA. Associated multifocal irregularity with stenosis of up to 40-50% by NASCET criteria. Superimposed small penetrating plaque noted within this region (series 7, image 227). Distally, right ICA widely patent to  the skullbase. No made of atheromatous plaque at the origin of the right external carotid artery without flow-limiting stenosis. Left carotid system: Left common carotid artery patent from its origin to the bifurcation. Eccentric calcified plaque about the left bifurcation/proximal left ICA with associated narrowing of approximately 30% by NASCET criteria. There is a irregular curvilinear filling defect within the proximal left ICA, likely a small dissection flap (series 7, image 210). No associated flow-limiting stenosis or significant intraluminal thrombus. This is of unknown chronicity. Distally, left ICA widely patent to the skullbase without additional stenosis. Vertebral arteries: Both of the vertebral arteries arise from the subclavian arteries. Vertebral arteries somewhat poorly evaluated proximally due to body habitus. Vertebral arteries  patent within the neck without flow-limiting stenosis, dissection, or occlusion. Skeleton: No acute osseous abnormality. No worrisome lytic or blastic osseous lesions. Other neck: No acute soft tissue abnormality within the neck. No adenopathy. Upper chest: Visualized upper mediastinum within normal limits. Visualized lung apices are clear. Review of the MIP images confirms the above findings CTA HEAD FINDINGS Anterior circulation: Petrous segments widely patent bilaterally. Scattered atheromatous plaque within the cavernous ICAs without flow-limiting stenosis. A1 segments patent. Anterior communicating artery patent. Anterior cerebral arteries demonstrate mild multifocal atheromatous irregularity but are patent to their distal aspects. M1 segments widely patent without stenosis or occlusion. MCA bifurcations normal. Distal MCA branches well opacified and symmetric. Mild distal small vessel atheromatous irregularity. Posterior circulation: Vertebral arteries patent to the vertebrobasilar junction without flow-limiting stenosis. Posterior inferior cerebral arteries patent  proximally. Basilar artery widely patent without stenosis or occlusion. Superior cerebral arteries patent bilaterally. Left PCA supplied via the basilar and is well opacified to its distal aspect. Fetal type right PCA supplied via a widely patent right P com. Right PCA also patent to its distal aspect. Mild distal small vessel atheromatous irregularity. Venous sinuses: Patent. Anatomic variants: Fetal type right PCA. No aneurysm or vascular malformation. Delayed phase: No pathologic enhancement. Review of the MIP images confirms the above findings IMPRESSION: CTA NECK IMPRESSION: 1. 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 Finding is of unknown chronicity. 2. 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 3. 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 4. Widely patent vertebral arteries within the neck. CTA HEAD IMPRESSION: 1. Negative CTA of the intracranial circulation for large vessel occlusion or high-grade correctable stenosis. 2. Mild distal small vessel atheromatous irregularity involving both the anterior and posterior circulations. 3. Atheromatous plaque within the carotid siphons without significant stenosis. Electronically Signed   By: Rise Mu M.D.   On: 04/29/2016 04:28   Ct Angio Neck W Or Wo Contrast  Result Date: 04/29/2016 CLINICAL DATA:  Initial evaluation for acute syncope. EXAM: CT ANGIOGRAPHY HEAD AND NECK TECHNIQUE: Multidetector CT imaging of the head and neck was performed using the standard protocol during bolus administration of intravenous contrast. Multiplanar CT image reconstructions and MIPs were obtained to evaluate the vascular anatomy. Carotid stenosis measurements (when applicable) are obtained utilizing NASCET criteria, using the distal internal carotid diameter as the denominator. CONTRAST:  110 cc of Isovue 370.  COMPARISON:  Prior head CT from 04/28/16. FINDINGS: CTA NECK FINDINGS Aortic arch: Visualized aortic arch of normal caliber with normal 3 vessel morphology. Mild atheromatous plaque within the arch itself. No high-grade stenosis about the origin of the great vessels. Focal kinking with atheromatous plaque at the proximal right subclavian artery just be on the origin of the right common carotid artery. Secondary short-segment stenosis of approximately 70-80% (series 7, image 326). Subclavian arteries otherwise widely patent. Right carotid system: Right common carotid artery tortuous proximally but widely patent to the bifurcation. Atheromatous plaque about the right bifurcation/proximal right ICA. Associated multifocal irregularity with stenosis of up to 40-50% by NASCET criteria. Superimposed small penetrating plaque noted within this region (series 7, image 227). Distally, right ICA widely patent to the skullbase. No made of atheromatous plaque at the origin of the right external carotid artery without flow-limiting stenosis. Left carotid system: Left common carotid artery patent from its origin to the bifurcation. Eccentric calcified plaque about the left bifurcation/proximal left ICA  with associated narrowing of approximately 30% by NASCET criteria. There is a irregular curvilinear filling defect within the proximal left ICA, likely a small dissection flap (series 7, image 210). No associated flow-limiting stenosis or significant intraluminal thrombus. This is of unknown chronicity. Distally, left ICA widely patent to the skullbase without additional stenosis. Vertebral arteries: Both of the vertebral arteries arise from the subclavian arteries. Vertebral arteries somewhat poorly evaluated proximally due to body habitus. Vertebral arteries patent within the neck without flow-limiting stenosis, dissection, or occlusion. Skeleton: No acute osseous abnormality. No worrisome lytic or blastic osseous lesions. Other  neck: No acute soft tissue abnormality within the neck. No adenopathy. Upper chest: Visualized upper mediastinum within normal limits. Visualized lung apices are clear. Review of the MIP images confirms the above findings CTA HEAD FINDINGS Anterior circulation: Petrous segments widely patent bilaterally. Scattered atheromatous plaque within the cavernous ICAs without flow-limiting stenosis. A1 segments patent. Anterior communicating artery patent. Anterior cerebral arteries demonstrate mild multifocal atheromatous irregularity but are patent to their distal aspects. M1 segments widely patent without stenosis or occlusion. MCA bifurcations normal. Distal MCA branches well opacified and symmetric. Mild distal small vessel atheromatous irregularity. Posterior circulation: Vertebral arteries patent to the vertebrobasilar junction without flow-limiting stenosis. Posterior inferior cerebral arteries patent proximally. Basilar artery widely patent without stenosis or occlusion. Superior cerebral arteries patent bilaterally. Left PCA supplied via the basilar and is well opacified to its distal aspect. Fetal type right PCA supplied via a widely patent right P com. Right PCA also patent to its distal aspect. Mild distal small vessel atheromatous irregularity. Venous sinuses: Patent. Anatomic variants: Fetal type right PCA. No aneurysm or vascular malformation. Delayed phase: No pathologic enhancement. Review of the MIP images confirms the above findings IMPRESSION: CTA NECK IMPRESSION: 1. 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 Finding is of unknown chronicity. 2. 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 3. 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 4. Widely patent vertebral arteries within the neck. CTA HEAD IMPRESSION: 1. Negative CTA of the intracranial  circulation for large vessel occlusion or high-grade correctable stenosis. 2. Mild distal small vessel atheromatous irregularity involving both the anterior and posterior circulations. 3. Atheromatous plaque within the carotid siphons without significant stenosis. Electronically Signed   By: Rise Mu M.D.   On: 04/29/2016 04:28   Ct Angio Chest Pe W Or Wo Contrast  Result Date: 04/29/2016 CLINICAL DATA:  71 year old male with elevated D-dimer. EXAM: CT ANGIOGRAPHY CHEST WITH CONTRAST TECHNIQUE: Multidetector CT imaging of the chest was performed using the standard protocol during bolus administration of intravenous contrast. Multiplanar CT image reconstructions and MIPs were obtained to evaluate the vascular anatomy. CONTRAST:  100 cc Isovue 370 COMPARISON:  Chest radiograph dated 04/28/2016 FINDINGS: Cardiovascular: There is mild cardiomegaly. No pericardial effusion. Mild coronary vascular calcification predominantly involving the LAD. Mild atherosclerotic calcification of the thoracic aorta. No aneurysmal dilatation or evidence of dissection. The origins of the great vessels of the aortic arch appear patent. Evaluation of the pulmonary arteries is limited due to suboptimal opacification of the distal branches. No large central pulmonary artery embolus identified. Mediastinum/Nodes: There is no hilar or mediastinal adenopathy. The esophagus is grossly unremarkable. Lungs/Pleura: Right lung base subsegmental atelectatic changes noted. Infiltrate is less likely but not excluded. Clinical correlation is recommended. There is no pleural effusion or pneumothorax. The central airways are patent. Upper  Abdomen: There is diffuse fatty infiltration of the liver. Slight irregularity of the hepatic contour concerning for cirrhosis. There is mild eventration of the right hemidiaphragm. Musculoskeletal: Mild degenerative changes of the spine. No acute fracture. Review of the MIP images confirms the above  findings. IMPRESSION: No CT evidence of central pulmonary artery embolus. Mild elevation of the right hemidiaphragm with right lung base subsegmental atelectasis/ scarring. Infiltrate is less likely. Clinical correlation is recommended. Electronically Signed   By: Elgie Collard M.D.   On: 04/29/2016 04:17   Dg Chest Port 1 View  Result Date: 04/29/2016 CLINICAL DATA:  Acute onset shortness of Breath EXAM: PORTABLE CHEST 1 VIEW COMPARISON:  04/29/2016, 04/28/2016 FINDINGS: Cardiac shadow is again enlarged but stable. Elevation of the right hemidiaphragm is again noted with mild atelectatic changes. No focal confluent infiltrate is seen. No other focal abnormality is noted IMPRESSION: Stable right basilar atelectasis and elevation the right hemidiaphragm. No acute abnormality is seen. Electronically Signed   By: Alcide Clever M.D.   On: 04/29/2016 09:20     Scheduled Meds: . ALPRAZolam  0.5 mg Oral QHS  . atorvastatin  40 mg Oral q1800  . buPROPion  450 mg Oral Daily  . ceFEPime (MAXIPIME) IV  2 g Intravenous Q12H  . gabapentin  800 mg Oral TID  . sertraline  150 mg Oral Daily  . sodium chloride flush  3 mL Intravenous Q12H  . vancomycin  750 mg Intravenous Q12H   Continuous Infusions: . sodium chloride 100 mL/hr at 04/29/16 1030  . sodium chloride 100 mL/hr at 04/30/16 0555  . heparin 2,000 Units/hr (04/30/16 1030)     LOS: 1 day   Time Spent in minutes   30 minutes  Willie Loy D.O. on 04/30/2016 at 1:59 PM  Between 7am to 7pm - Pager - [phone]  After 7pm go to [email or web address] - password [email or web address]  And look for the night coverage person covering for me after hours  [facility] Office  [phone]

## 2016-04-30 NOTE — Progress Notes (Addendum)
EKG shows Sinus Tach with first degree heart block, pulmonary disease pattern, LAFB and widening QRS. P waves not visible, however. HR remains in the 130s-140s. MD repaged about heart rate. Will continue to monitor.

## 2016-04-30 NOTE — Progress Notes (Signed)
OT Cancellation Note  Patient Details Name: Jorge DroughtGeorge O Pyon MRN: [id number] DOB: 02-12-1946   Cancelled Treatment:     Reason pt not seen/Treatment not complete: Pt c/o being nauseous, w/ headache, sweaty and w/ Left arm pain, does not feel as though he can participate in therapy. Pt is also scheduled for cardiac cath later today. RN was made aware of pt not feeling well and all of his symptoms. Will cancel/hold treatment for today and attempt next available date when pt is medically able to participate.  Charletta CousinBarnhill, Amy Beth Dixon, OTR/L 04/30/2016, 11:25 AM

## 2016-04-30 NOTE — Interval H&P Note (Signed)
History and Physical Interval Note:  04/30/2016 4:34 PM  Jorge [name]  has presented today for cardiac cath with the diagnosis of severe aortic stenosis, syncope  The various methods of treatment have been discussed with the patient and family. After consideration of risks, benefits and other options for treatment, the patient has consented to  Procedure(s): Right/Left Heart Cath and Coronary Angiography (N/A) as a surgical intervention .  The patient's history has been reviewed, patient examined, no change in status, stable for surgery.  I have reviewed the patient's chart and labs.  Questions were answered to the patient's satisfaction.    Cath Lab Visit (complete for each Cath Lab visit)  Clinical Evaluation Leading to the Procedure:   ACS: No  Non-ACS:    Anginal Classification: CCS II  Anti-ischemic medical therapy: Minimal Therapy (1 class of medications)  Non-Invasive Test Results: No non-invasive testing performed  Prior CABG: No previous CABG         Verne Carrowhristopher McAlhany

## 2016-04-30 NOTE — [place] (Signed)
[place] MEDICAID FL2 LEVEL OF CARE SCREENING TOOL     IDENTIFICATION  Patient Name: Jorge [name] Birthdate: 1945/09/29 Sex: male Admission Date (Current Location): 04/28/2016  [facility] [place]  [employer]   Facility and Address:  [facility], [address], [place], [place] [address]      Provider Number: [id number]  Attending Physician Name and Address:  Edsel PetrinMaryann Mikhail, DO  Relative Name and Phone Number:       Current Level of Care: Hospital Recommended Level of Care: Skilled Nursing Facility Prior Approval Number:    Date Approved/Denied:   PASRR Number: [id number]  Discharge Plan: SNF    Current Diagnoses: Patient Active Problem List   Diagnosis Date Noted  . Hypotension 04/29/2016  . NSTEMI (non-ST elevated myocardial infarction) (HCC)   . Syncope 04/28/2016  . Fall   . Ileus (HCC) 05/08/2015  . Abdominal pain 05/08/2015  . Nausea and vomiting 05/08/2015  . HTN (hypertension)   . Mixed hyperlipidemia   . CHF (congestive heart failure) (HCC)   . Cerebrovascular accident (CVA) due to stenosis of cerebral artery (HCC) 08/16/2014  . Paroxysmal atrial fibrillation (HCC) 05/29/2014  . Hemispheric carotid artery syndrome   . Internal carotid artery stenosis   . CAD (coronary artery disease) 05/25/2014  . History of tobacco abuse 05/25/2014  . TIA (transient ischemic attack) 05/25/2014  . HLD (hyperlipidemia)   . Essential hypertension   . Stroke-like symptoms 05/24/2014  . Dyspnea 03/22/2012  . Aortic stenosis 03/22/2012  . Acute edema of lung, unspecified 03/22/2012  . History of CEA (carotid endarterectomy) 03/22/2012  . Chest pain 03/22/2012    Orientation RESPIRATION BLADDER Height & Weight     Self, Time, Situation, Place  O2 (Nasal Canula 2 L) Incontinent, External catheter Weight: (!) 317 lb (143.8 kg) (scale c) Height:  6\' 2"  (188 cm)  BEHAVIORAL SYMPTOMS/MOOD NEUROLOGICAL BOWEL NUTRITION STATUS   (None)  (None) Continent Diet (See discharge summary)  AMBULATORY STATUS COMMUNICATION OF NEEDS Skin   Limited Assist Verbally Bruising                       Personal Care Assistance Level of Assistance  Bathing, Feeding, Dressing Bathing Assistance: Maximum assistance Feeding assistance: Limited assistance Dressing Assistance: Maximum assistance     Functional Limitations Info  Sight, Hearing, Speech Sight Info: Adequate Hearing Info: Adequate Speech Info: Adequate    SPECIAL CARE FACTORS FREQUENCY  PT (By licensed PT), Blood pressure, OT (By licensed OT)     PT Frequency: 5 x week OT Frequency: 5 x week            Contractures Contractures Info: Not present    Additional Factors Info  Code Status, Allergies Code Status Info: Full Allergies Info: Coumadin (Warfarin Sodium)           Current Medications (04/30/2016):  This is the current hospital active medication list Current Facility-Administered Medications  Medication Dose Route Frequency Provider Last Rate Last Dose  . 0.9 %  sodium chloride infusion   Intravenous Continuous Belkys A Regalado, MD 100 mL/hr at 04/29/16 1030    . 0.9 %  sodium chloride infusion   Intravenous Continuous Little IshikawaErin E Smith, NP 100 mL/hr at 04/30/16 0555    . acetaminophen (TYLENOL) tablet 650 mg  650 mg Oral Q6H PRN Marcos EkeSara E Wertman, PA-C   650 mg at 04/29/16 1351   Or  . acetaminophen (TYLENOL) suppository 650 mg  650 mg Rectal Q6H PRN Marcos Eke, PA-C      . ALPRAZolam Prudy Feeler) tablet 0.5 mg  0.5 mg Oral QHS Marcos Eke, PA-C   0.5 mg at 04/29/16 2104  . alum & mag hydroxide-simeth (MAALOX/MYLANTA) 200-200-20 MG/5ML suspension 30 mL  30 mL Oral Q6H PRN Marcos Eke, PA-C      . atorvastatin (LIPITOR) tablet 40 mg  40 mg Oral q1800 Marcos Eke, PA-C   40 mg at 04/29/16 1744  . buPROPion [facility] SR) 12 hr tablet 450 mg  450 mg Oral Daily Marcos Eke, PA-C   450 mg at 04/30/16 1032  . ceFEPIme (MAXIPIME) 2 g in  dextrose 5 % 50 mL IVPB  2 g Intravenous Q12H Marquita Palms, RPH   2 g at 04/30/16 1033  . gabapentin (NEURONTIN) capsule 800 mg  800 mg Oral TID Marcos Eke, PA-C   800 mg at 04/30/16 1032  . heparin ADULT infusion 100 units/mL (25000 units/267mL sodium chloride 0.45%)  2,000 Units/hr Intravenous Continuous Marquita Palms, RPH 20 mL/hr at 04/30/16 1030 2,000 Units/hr at 04/30/16 1030  . ondansetron (ZOFRAN) tablet 4 mg  4 mg Oral Q6H PRN Marcos Eke, PA-C       Or  . ondansetron [facility]) injection 4 mg  4 mg Intravenous Q6H PRN Marcos Eke, PA-C   4 mg at 04/28/16 2236  . senna-docusate (Senokot-S) tablet 1 tablet  1 tablet Oral QHS PRN Marcos Eke, PA-C      . sertraline (ZOLOFT) tablet 150 mg  150 mg Oral Daily Marcos Eke, PA-C   150 mg at 04/30/16 1031  . sodium chloride flush (NS) 0.9 % injection 3 mL  3 mL Intravenous Q12H Marcos Eke, PA-C   3 mL at 04/30/16 1000  . vancomycin (VANCOCIN) IVPB 750 mg/150 ml premix  750 mg Intravenous Q12H Marquita Palms, RPH   750 mg at 04/29/16 2107     Discharge Medications: Please see discharge summary for a list of discharge medications.  Relevant Imaging Results:  Relevant Lab Results:   Additional Information SS#: [id number]. Might want to check his MCR days. His PASARR was started on 03/19/16.  Jorge Liner, LCSW

## 2016-04-30 NOTE — Progress Notes (Signed)
PT Cancellation Note  Patient Details Name: Marygrace DroughtGeorge O Berger MRN: [id number] DOB: Dec 17, 1945   Cancelled Treatment:    Reason Eval/Treat Not Completed: Medical issues which prohibited therapy. For cath this pm.   Fabio AsaDevon J Meryl Ponder 04/30/2016, 1:53 PM Charlotte Crumbevon Wilford Merryfield, PT DPT  [phone]

## 2016-04-30 NOTE — Progress Notes (Signed)
Patient Name: Jorge [name] Date of Encounter: 04/30/2016  Primary Cardiologist: Dr. Christeen Douglas Problem List     Active Problems:   Dyspnea   Aortic stenosis   History of CEA (carotid endarterectomy)   Chest pain   CAD (coronary artery disease)   History of tobacco abuse   HLD (hyperlipidemia)   CHF (congestive heart failure) (HCC)   Syncope   Hypotension   NSTEMI (non-ST elevated myocardial infarction) (HCC)     Subjective   Lethargic. No chest pain or syncope. Awaiting heart cath later today.   Inpatient Medications    Scheduled Meds: . ALPRAZolam  0.5 mg Oral QHS  . atorvastatin  40 mg Oral q1800  . buPROPion  450 mg Oral Daily  . ceFEPime (MAXIPIME) IV  2 g Intravenous Q12H  . gabapentin  800 mg Oral TID  . heparin  4,000 Units Intravenous Once  . sertraline  150 mg Oral Daily  . sodium chloride flush  3 mL Intravenous Q12H  . vancomycin  750 mg Intravenous Q12H   Continuous Infusions: . sodium chloride 100 mL/hr at 04/29/16 1030  . sodium chloride 100 mL/hr at 04/30/16 0555  . heparin 1,600 Units/hr (04/30/16 0223)   PRN Meds: acetaminophen **OR** acetaminophen, alum & mag hydroxide-simeth, ondansetron **OR** ondansetron (ZOFRAN) IV, senna-docusate   Vital Signs    Vitals:   04/29/16 2010 04/30/16 0021 04/30/16 0632 04/30/16 0839  BP: (!) 118/48 (!) 120/50 (!) 94/59 (!) 99/50  Pulse: 74 88 85 75  Resp: 18 18 20 18   Temp: 98.1 F (36.7 C) 97.9 F (36.6 C) 98 F (36.7 C) 98.1 F (36.7 C)  TempSrc: Oral Oral Oral Oral  SpO2: 99% 99% 95% 93%  Weight:   (!) 317 lb (143.8 kg)   Height:        Intake/Output Summary (Last 24 hours) at 04/30/16 1016 Last data filed at 04/30/16 0857  Gross per 24 hour  Intake           566.14 ml  Output             3950 ml  Net         -3383.86 ml   Filed Weights   04/29/16 0618 04/29/16 1245 04/30/16 0632  Weight: 145 lb 6.4 oz (66 kg) (!) 323 lb 11.2 oz (146.8 kg) (!) 317 lb (143.8 kg)    Physical  Exam   GEN: Well nourished, well developed, in no acute distress. morbidly obese. lethargic HEENT: Grossly normal.  Neck: Supple, no JVD, left  carotid bruits, or masses. Cardiac: RRR, AS murmurs, rubs, or gallops. No clubbing, cyanosis, 2-3/6 harsh systolic murmur of AS.  Radials/DP/PT 2+ and equal bilaterally.  Respiratory:  Respirations regular and unlabored, clear to auscultation bilaterally. GI: Soft, nontender, nondistended, BS + x 4. MS: no deformity or atrophy. Skin: warm and dry, no rash. Neuro:  Strength and sensation are intact. Psych: AAOx3.  Normal affect.  Labs    CBC  Recent Labs  04/28/16 1249 04/29/16 0856  WBC 14.2* 9.7  HGB 13.7 12.8*  HCT 42.8 40.1  MCV 94.5 95.0  PLT 306 309   Basic Metabolic Panel  Recent Labs  04/28/16 1209 04/29/16 1145  NA 138 138  K 3.7 3.7  CL 103 107  CO2 26 24  GLUCOSE 132* 141*  BUN 11 11  CREATININE 0.99 1.01  CALCIUM 8.9 8.2*   Liver Function Tests  Recent Labs  04/28/16 1209  AST 21  ALT 17  ALKPHOS 219*  BILITOT 0.5  PROT 7.3  ALBUMIN 3.8   No results for input(s): LIPASE, AMYLASE in the last 72 hours. Cardiac Enzymes  Recent Labs  04/28/16 1249  04/29/16 1653 04/29/16 2325 04/30/16 0547  CKTOTAL 148  --   --   --   --   TROPONINI  --   < > <0.03 <0.03 <0.03  < > = values in this interval not displayed. BNP Invalid input(s): POCBNP D-Dimer  Recent Labs  04/28/16 1550  DDIMER 3.28*   Hemoglobin A1C No results for input(s): HGBA1C in the last 72 hours. Fasting Lipid Panel No results for input(s): CHOL, HDL, LDLCALC, TRIG, CHOLHDL, LDLDIRECT in the last 72 hours. Thyroid Function Tests No results for input(s): TSH, T4TOTAL, T3FREE, THYROIDAB in the last 72 hours.  Invalid input(s): FREET3  Telemetry    NSR - Personally Reviewed  ECG    NSR. LAD - Personally Reviewed  Radiology    Ct Angio Head W Or Wo Contrast  Result Date: 04/29/2016 CLINICAL DATA:  Initial evaluation for  acute syncope. EXAM: CT ANGIOGRAPHY HEAD AND NECK TECHNIQUE: Multidetector CT imaging of the head and neck was performed using the standard protocol during bolus administration of intravenous contrast. Multiplanar CT image reconstructions and MIPs were obtained to evaluate the vascular anatomy. Carotid stenosis measurements (when applicable) are obtained utilizing NASCET criteria, using the distal internal carotid diameter as the denominator. CONTRAST:  110 cc of Isovue 370. COMPARISON:  Prior head CT from 04/28/16. FINDINGS: CTA NECK FINDINGS Aortic arch: Visualized aortic arch of normal caliber with normal 3 vessel morphology. Mild atheromatous plaque within the arch itself. No high-grade stenosis about the origin of the great vessels. Focal kinking with atheromatous plaque at the proximal right subclavian artery just be on the origin of the right common carotid artery. Secondary short-segment stenosis of approximately 70-80% (series 7, image 326). Subclavian arteries otherwise widely patent. Right carotid system: Right common carotid artery tortuous proximally but widely patent to the bifurcation. Atheromatous plaque about the right bifurcation/proximal right ICA. Associated multifocal irregularity with stenosis of up to 40-50% by NASCET criteria. Superimposed small penetrating plaque noted within this region (series 7, image 227). Distally, right ICA widely patent to the skullbase. No made of atheromatous plaque at the origin of the right external carotid artery without flow-limiting stenosis. Left carotid system: Left common carotid artery patent from its origin to the bifurcation. Eccentric calcified plaque about the left bifurcation/proximal left ICA with associated narrowing of approximately 30% by NASCET criteria. There is a irregular curvilinear filling defect within the proximal left ICA, likely a small dissection flap (series 7, image 210). No associated flow-limiting stenosis or significant intraluminal  thrombus. This is of unknown chronicity. Distally, left ICA widely patent to the skullbase without additional stenosis. Vertebral arteries: Both of the vertebral arteries arise from the subclavian arteries. Vertebral arteries somewhat poorly evaluated proximally due to body habitus. Vertebral arteries patent within the neck without flow-limiting stenosis, dissection, or occlusion. Skeleton: No acute osseous abnormality. No worrisome lytic or blastic osseous lesions. Other neck: No acute soft tissue abnormality within the neck. No adenopathy. Upper chest: Visualized upper mediastinum within normal limits. Visualized lung apices are clear. Review of the MIP images confirms the above findings CTA HEAD FINDINGS Anterior circulation: Petrous segments widely patent bilaterally. Scattered atheromatous plaque within the cavernous ICAs without flow-limiting stenosis. A1 segments patent. Anterior communicating artery patent. Anterior cerebral arteries demonstrate mild multifocal atheromatous irregularity but are patent  to their distal aspects. M1 segments widely patent without stenosis or occlusion. MCA bifurcations normal. Distal MCA branches well opacified and symmetric. Mild distal small vessel atheromatous irregularity. Posterior circulation: Vertebral arteries patent to the vertebrobasilar junction without flow-limiting stenosis. Posterior inferior cerebral arteries patent proximally. Basilar artery widely patent without stenosis or occlusion. Superior cerebral arteries patent bilaterally. Left PCA supplied via the basilar and is well opacified to its distal aspect. Fetal type right PCA supplied via a widely patent right P com. Right PCA also patent to its distal aspect. Mild distal small vessel atheromatous irregularity. Venous sinuses: Patent. Anatomic variants: Fetal type right PCA. No aneurysm or vascular malformation. Delayed phase: No pathologic enhancement. Review of the MIP images confirms the above findings  IMPRESSION: CTA NECK IMPRESSION: 1. 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 Finding is of unknown chronicity. 2. 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 3. 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 4. Widely patent vertebral arteries within the neck. CTA HEAD IMPRESSION: 1. Negative CTA of the intracranial circulation for large vessel occlusion or high-grade correctable stenosis. 2. Mild distal small vessel atheromatous irregularity involving both the anterior and posterior circulations. 3. Atheromatous plaque within the carotid siphons without significant stenosis. Electronically Signed   By: Rise Mu M.D.   On: 04/29/2016 04:28   Dg Chest 1 View  Result Date: 04/28/2016 CLINICAL DATA:  Syncopal episode while shaving this morning. LEFT arm pain. History of recent humerus fracture. EXAM: CHEST 1 VIEW COMPARISON:  Chest radiograph March 21, 2016 FINDINGS: Cardiac silhouette is upper limits of normal in size. Mediastinal silhouette is nonsuspicious, mildly calcified aortic knob. Persistently elevated RIGHT hemidiaphragm with bibasilar strandy densities. Rounded density projects in RIGHT lung apex. No pneumothorax. Soft tissue planes and included osseous structures are nonsuspicious. IMPRESSION: Rounded density projecting RIGHT lung apex could represent EEG lead sticker, less likely consolidation/pneumonia. Persistently elevated RIGHT hemidiaphragm with bibasilar atelectasis. Borderline cardiomegaly. Electronically Signed   By: Awilda Metro M.D.   On: 04/28/2016 13:29   Ct Head Wo Contrast  Result Date: 04/28/2016 CLINICAL DATA:  Trauma, fall while walking dog EXAM: CT HEAD WITHOUT CONTRAST CT CERVICAL SPINE WITHOUT CONTRAST TECHNIQUE: Multidetector CT imaging of the head and cervical spine was performed following the standard  protocol without intravenous contrast. Multiplanar CT image reconstructions of the cervical spine were also generated. COMPARISON:  CT head dated 03/19/2016. CT head/ cervical spine dated 03/16/2016. FINDINGS: CT HEAD FINDINGS Brain: No evidence of acute infarction, hemorrhage, hydrocephalus, extra-axial collection or mass lesion/mass effect. Subcortical white matter and periventricular small vessel ischemic changes. Global cortical atrophy. Vascular: Mild intracranial atherosclerosis. Skull: Normal. Negative for fracture or focal lesion. Sinuses/Orbits: Mild partial opacification of the right maxillary sinus. Visualized paranasal sinuses and mastoid air cells are otherwise clear. Other: None. CT CERVICAL SPINE FINDINGS Alignment: Normal cervical lordosis. Skull base and vertebrae: No acute fracture. No primary bone lesion or focal pathologic process. Soft tissues and spinal canal: No prevertebral fluid or swelling. No visible canal hematoma. Disc levels: Mild degenerative changes. Spinal canal remains patent. Upper chest: Visualized lung apices are essentially clear. Other: Visualized thyroid is unremarkable. IMPRESSION: No evidence of acute intracranial abnormality. Atrophy with small vessel ischemic changes. No evidence of traumatic injury to the cervical spine. Mild degenerative changes. Electronically Signed   By: Charline Bills M.D.   On: 04/28/2016 13:16   Ct  Angio Neck W Or Wo Contrast  Result Date: 04/29/2016 CLINICAL DATA:  Initial evaluation for acute syncope. EXAM: CT ANGIOGRAPHY HEAD AND NECK TECHNIQUE: Multidetector CT imaging of the head and neck was performed using the standard protocol during bolus administration of intravenous contrast. Multiplanar CT image reconstructions and MIPs were obtained to evaluate the vascular anatomy. Carotid stenosis measurements (when applicable) are obtained utilizing NASCET criteria, using the distal internal carotid diameter as the denominator. CONTRAST:  110  cc of Isovue 370. COMPARISON:  Prior head CT from 04/28/16. FINDINGS: CTA NECK FINDINGS Aortic arch: Visualized aortic arch of normal caliber with normal 3 vessel morphology. Mild atheromatous plaque within the arch itself. No high-grade stenosis about the origin of the great vessels. Focal kinking with atheromatous plaque at the proximal right subclavian artery just be on the origin of the right common carotid artery. Secondary short-segment stenosis of approximately 70-80% (series 7, image 326). Subclavian arteries otherwise widely patent. Right carotid system: Right common carotid artery tortuous proximally but widely patent to the bifurcation. Atheromatous plaque about the right bifurcation/proximal right ICA. Associated multifocal irregularity with stenosis of up to 40-50% by NASCET criteria. Superimposed small penetrating plaque noted within this region (series 7, image 227). Distally, right ICA widely patent to the skullbase. No made of atheromatous plaque at the origin of the right external carotid artery without flow-limiting stenosis. Left carotid system: Left common carotid artery patent from its origin to the bifurcation. Eccentric calcified plaque about the left bifurcation/proximal left ICA with associated narrowing of approximately 30% by NASCET criteria. There is a irregular curvilinear filling defect within the proximal left ICA, likely a small dissection flap (series 7, image 210). No associated flow-limiting stenosis or significant intraluminal thrombus. This is of unknown chronicity. Distally, left ICA widely patent to the skullbase without additional stenosis. Vertebral arteries: Both of the vertebral arteries arise from the subclavian arteries. Vertebral arteries somewhat poorly evaluated proximally due to body habitus. Vertebral arteries patent within the neck without flow-limiting stenosis, dissection, or occlusion. Skeleton: No acute osseous abnormality. No worrisome lytic or blastic osseous  lesions. Other neck: No acute soft tissue abnormality within the neck. No adenopathy. Upper chest: Visualized upper mediastinum within normal limits. Visualized lung apices are clear. Review of the MIP images confirms the above findings CTA HEAD FINDINGS Anterior circulation: Petrous segments widely patent bilaterally. Scattered atheromatous plaque within the cavernous ICAs without flow-limiting stenosis. A1 segments patent. Anterior communicating artery patent. Anterior cerebral arteries demonstrate mild multifocal atheromatous irregularity but are patent to their distal aspects. M1 segments widely patent without stenosis or occlusion. MCA bifurcations normal. Distal MCA branches well opacified and symmetric. Mild distal small vessel atheromatous irregularity. Posterior circulation: Vertebral arteries patent to the vertebrobasilar junction without flow-limiting stenosis. Posterior inferior cerebral arteries patent proximally. Basilar artery widely patent without stenosis or occlusion. Superior cerebral arteries patent bilaterally. Left PCA supplied via the basilar and is well opacified to its distal aspect. Fetal type right PCA supplied via a widely patent right P com. Right PCA also patent to its distal aspect. Mild distal small vessel atheromatous irregularity. Venous sinuses: Patent. Anatomic variants: Fetal type right PCA. No aneurysm or vascular malformation. Delayed phase: No pathologic enhancement. Review of the MIP images confirms the above findings IMPRESSION: CTA NECK IMPRESSION: 1. 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 Finding is of unknown chronicity. 2. 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 3. Short-segment  fairly severe proximal right subclavian artery stenosis measuring up to 70-80%. 4. Widely patent vertebral arteries within the neck. CTA HEAD IMPRESSION: 1. Negative CTA of the  intracranial circulation for large vessel occlusion or high-grade correctable stenosis. 2. Mild distal small vessel atheromatous irregularity involving both the anterior and posterior circulations. 3. Atheromatous plaque within the carotid siphons without significant stenosis. Electronically Signed   By: Rise Mu M.D.   On: 04/29/2016 04:28   Ct Angio Chest Pe W Or Wo Contrast  Result Date: 04/29/2016 CLINICAL DATA:  71 year old male with elevated D-dimer. EXAM: CT ANGIOGRAPHY CHEST WITH CONTRAST TECHNIQUE: Multidetector CT imaging of the chest was performed using the standard protocol during bolus administration of intravenous contrast. Multiplanar CT image reconstructions and MIPs were obtained to evaluate the vascular anatomy. CONTRAST:  100 cc Isovue 370 COMPARISON:  Chest radiograph dated 04/28/2016 FINDINGS: Cardiovascular: There is mild cardiomegaly. No pericardial effusion. Mild coronary vascular calcification predominantly involving the LAD. Mild atherosclerotic calcification of the thoracic aorta. No aneurysmal dilatation or evidence of dissection. The origins of the great vessels of the aortic arch appear patent. Evaluation of the pulmonary arteries is limited due to suboptimal opacification of the distal branches. No large central pulmonary artery embolus identified. Mediastinum/Nodes: There is no hilar or mediastinal adenopathy. The esophagus is grossly unremarkable. Lungs/Pleura: Right lung base subsegmental atelectatic changes noted. Infiltrate is less likely but not excluded. Clinical correlation is recommended. There is no pleural effusion or pneumothorax. The central airways are patent. Upper Abdomen: There is diffuse fatty infiltration of the liver. Slight irregularity of the hepatic contour concerning for cirrhosis. There is mild eventration of the right hemidiaphragm. Musculoskeletal: Mild degenerative changes of the spine. No acute fracture. Review of the MIP images confirms the  above findings. IMPRESSION: No CT evidence of central pulmonary artery embolus. Mild elevation of the right hemidiaphragm with right lung base subsegmental atelectasis/ scarring. Infiltrate is less likely. Clinical correlation is recommended. Electronically Signed   By: Elgie Collard M.D.   On: 04/29/2016 04:17   Ct Cervical Spine Wo Contrast  Result Date: 04/28/2016 CLINICAL DATA:  Trauma, fall while walking dog EXAM: CT HEAD WITHOUT CONTRAST CT CERVICAL SPINE WITHOUT CONTRAST TECHNIQUE: Multidetector CT imaging of the head and cervical spine was performed following the standard protocol without intravenous contrast. Multiplanar CT image reconstructions of the cervical spine were also generated. COMPARISON:  CT head dated 03/19/2016. CT head/ cervical spine dated 03/16/2016. FINDINGS: CT HEAD FINDINGS Brain: No evidence of acute infarction, hemorrhage, hydrocephalus, extra-axial collection or mass lesion/mass effect. Subcortical white matter and periventricular small vessel ischemic changes. Global cortical atrophy. Vascular: Mild intracranial atherosclerosis. Skull: Normal. Negative for fracture or focal lesion. Sinuses/Orbits: Mild partial opacification of the right maxillary sinus. Visualized paranasal sinuses and mastoid air cells are otherwise clear. Other: None. CT CERVICAL SPINE FINDINGS Alignment: Normal cervical lordosis. Skull base and vertebrae: No acute fracture. No primary bone lesion or focal pathologic process. Soft tissues and spinal canal: No prevertebral fluid or swelling. No visible canal hematoma. Disc levels: Mild degenerative changes. Spinal canal remains patent. Upper chest: Visualized lung apices are essentially clear. Other: Visualized thyroid is unremarkable. IMPRESSION: No evidence of acute intracranial abnormality. Atrophy with small vessel ischemic changes. No evidence of traumatic injury to the cervical spine. Mild degenerative changes. Electronically Signed   By: Charline Bills M.D.   On: 04/28/2016 13:16   Dg Chest Port 1 View  Result Date: 04/29/2016 CLINICAL DATA:  Acute onset shortness of Breath  EXAM: PORTABLE CHEST 1 VIEW COMPARISON:  04/29/2016, 04/28/2016 FINDINGS: Cardiac shadow is again enlarged but stable. Elevation of the right hemidiaphragm is again noted with mild atelectatic changes. No focal confluent infiltrate is seen. No other focal abnormality is noted IMPRESSION: Stable right basilar atelectasis and elevation the right hemidiaphragm. No acute abnormality is seen. Electronically Signed   By: Alcide Clever M.D.   On: 04/29/2016 09:20   Dg Humerus Left  Result Date: 04/28/2016 CLINICAL DATA:  Syncopal episode while shaving this morning. LEFT arm pain. History of recent humerus fracture. EXAM: LEFT HUMERUS - 2+ VIEW COMPARISON:  LEFT humerus fracture March 20, 2016 FINDINGS: Healing displaced proximal humerus fracture with callus, rarefaction of the fracture line. No new fracture deformity. No dislocation. Distal humerus in fiberglass cast. No destructive bony lesions. Soft tissue planes are nonsuspicious. IMPRESSION: Healing displaced proximal LEFT humerus fracture ; no new fracture deformity or dislocation. Electronically Signed   By: Awilda Metro M.D.   On: 04/28/2016 13:31    Cardiac Studies   2D ECHO 04/29/2016 LV EF: 55% -   60% Study Conclusions - Left ventricle: The cavity size was normal. Wall thickness was   increased in a pattern of moderate LVH. Systolic function was   normal. The estimated ejection fraction was in the range of 55%   to 60%. Wall motion was normal; there were no regional wall   motion abnormalities. Doppler parameters are consistent with   abnormal left ventricular relaxation (grade 1 diastolic   dysfunction). Doppler parameters are consistent with high   ventricular filling pressure. - Aortic valve: There was severe stenosis. There was trivial   regurgitation. - Left atrium: The atrium was moderately  dilated. - Pulmonary arteries: Systolic pressure was mildly increased. PA   peak pressure: 42 mm Hg (S). Impressions: - Technically difficult; definity used; normal LV systolic   function; grade 1 diastolic dysfunction with elevated LV filling   pressure; moderate LVH; calcified aortic valve with severe AS;   moderate LAE; mildly elevated pulmonary pressure.  Patient Profile     Mr. Taillon is a 71 year old male with a past medical history of PAF (on Eliquis), carotid artery disease s/p L CEA (2014), HTN, HLD, obesity, OSA, moderate AS, and CVA who presented to the [facility] ED on 04/28/16 with syncope.   Assessment & Plan    1. Syncope/NSTEMI: patient has had 2 syncopal episodes in the past 2 months. Both of which sound like true cardiac syncope. First Troponin I elevated to 0.15, but subsequent troponin negative. ECHO this admission showed progression of AS from moderate to severe. He has RFs for CAD including family history, age, obesity.   Will need an ischemic evaluation and further evaluation of aortic gradients with left and right heart cath, last dose of Eliquis was 04/28/16 morning. Started on IV heparin. Plan for cath today.   2. Severe AS: repeat echo this admission shows severe AS.  Mean gradient 41 mmHg and peak 70 mmHg Will further investigate with right heart cath and left to assess cors. He will likely need valve replacement  3. History of CVA: continue statin   4. Carotid artery stenosis:  S/p L CEA in 2014.  Follows with Dr. Darrick Penna. Last seen in Feb. 2016, duplex showed right ICA stenosis of 60-79%.  . 5. HTN: BPs have been low. Treated with IVFs and holding lasix.    Charlton Haws

## 2016-04-30 NOTE — Clinical Social Work Note (Signed)
Clinical Social Work Assessment  Patient Details  Name: Jorge [name] MRN: [id number] Date of Birth: November 07, 1945  Date of referral:  04/30/16               Reason for consult:  Facility Placement, Discharge Planning                Permission sought to share information with:  Facility Sport and exercise psychologist, Family Supports Permission granted to share information::  Yes, Verbal Permission Granted  Name::     Herbie Lehrmann  Agency::  SNF's  Relationship::  Son  Contact Information:  [phone]  Housing/Transportation Living arrangements for the past 2 months:  [place] of Information:  Patient, Medical Team Patient Interpreter Needed:  None Criminal Activity/Legal Involvement Pertinent to Current Situation/Hospitalization:  No - Comment as needed Significant Relationships:  Adult Children Lives with:  Self Do you feel safe going back to the place where you live?  No Need for family participation in patient care:  Yes (Comment)  Care giving concerns:  PT recommending SNF once medically stable for discharge.   Social Worker assessment / plan:  CSW met with patient. No supports at bedside. CSW introduced role and explained that PT recommendations would be discussed. Patient currently lives in an apartment at an [facility]F: [facility]. Patient agreeable to SNF placement. His main concern is his finances. Patient only get social security. CSW explained how insurance covers SNF. Patient states he thinks he has a supplemental insurance but isn't sure. Facesheet has none documented. No further concerns. CSW encouraged patient to contact CSW as needed. CSW will continue to follow patient for support and facilitate discharge to SNF once medically stable.  Employment status:  Unemployed (Gets social security) Insurance information:  Managed Medicare PT Recommendations:  [place] / Referral to community resources:  [place]  Patient/Family's Response to care:  Patient agreeable to SNF placement. Patient's son supportive and involved in patient's care. Patient appreciated social work intervention.  Patient/Family's Understanding of and Emotional Response to Diagnosis, Current Treatment, and Prognosis:  Patient has a good understanding of reason for hospitalization. Patient appears pleased with hospital care.  Emotional Assessment Appearance:  Appears stated age Attitude/Demeanor/Rapport:  Other (Pleasant) Affect (typically observed):  Accepting, Appropriate, Calm, Pleasant Orientation:  Oriented to Self, Oriented to Place, Oriented to  Time, Oriented to Situation Alcohol / Substance use:  Tobacco Use Psych involvement (Current and /or in the community):  No (Comment)  Discharge Needs  Concerns to be addressed:  Care Coordination Readmission within the last 30 days:  No Current discharge risk:  Dependent with Mobility, Lives alone Barriers to Discharge:  Continued Medical Work up   Candie Chroman, LCSW 04/30/2016, 11:24 AM

## 2016-04-30 NOTE — Clinical Social Work Placement (Signed)
   CLINICAL SOCIAL WORK PLACEMENT  NOTE  Date:  04/30/2016  Patient Details  Name: Marygrace DroughtGeorge O Earnhart MRN: [id number] Date of Birth: 09-14-1945  Clinical Social Work is seeking post-discharge placement for this patient at the Skilled  Nursing Facility level of care (*CSW will initial, date and re-position this form in  chart as items are completed):  Yes   Patient/family provided with [place] Clinical Social Work Department's list of facilities offering this level of care within the geographic area requested by the patient (or if unable, by the patient's family).  Yes   Patient/family informed of their freedom to choose among providers that offer the needed level of care, that participate in Medicare, Medicaid or managed care program needed by the patient, have an available bed and are willing to accept the patient.  Yes   Patient/family informed of [facility]'s ownership interest [facility] and [facility], as well as of the fact that they are under no obligation to receive care at these facilities.  PASRR submitted to EDS on 04/30/16     PASRR number received on       Existing PASRR number confirmed on 04/30/16     FL2 transmitted to all facilities in geographic area requested by pt/family on 04/30/16     FL2 transmitted to all facilities within larger geographic area on       Patient informed that his/her managed care company has contracts with or will negotiate with certain facilities, including the following:            Patient/family informed of bed offers received.  Patient chooses bed at       Physician recommends and patient chooses bed at      Patient to be transferred to   on  .  Patient to be transferred to facility by       Patient family notified on   of transfer.  Name of family member notified:        PHYSICIAN Please sign FL2     Additional Comment:    _______________________________________________ Margarito LinerSarah C Brad Lieurance, LCSW 04/30/2016,  11:28 AM

## 2016-04-30 NOTE — Progress Notes (Signed)
ANTICOAGULATION CONSULT NOTE - f/u Consult  Pharmacy Consult for Heparin Indication: chest pain/ACS  Allergies  Allergen Reactions  . Coumadin [Warfarin Sodium] Rash    Patient Measurements: Height: 6\' 2"  (188 cm) Weight: (!) 317 lb (143.8 kg) (scale c) IBW/kg (Calculated) : 82.2 Heparin Dosing Weight: 115.4 kg  Vital Signs: Temp: 98 F (36.7 C) (01/24 0632) Temp Source: Oral (01/24 0632) BP: 94/59 (01/24 16100632) Pulse Rate: 85 (01/24 0632)  Labs:  Recent Labs  04/28/16 1209 04/28/16 1249  04/29/16 0856  04/29/16 1145 04/29/16 1653 04/29/16 1839 04/29/16 2325 04/30/16 0547 04/30/16 0812  HGB  --  13.7  --  12.8*  --   --   --   --   --   --   --   HCT  --  42.8  --  40.1  --   --   --   --   --   --   --   PLT  --  306  --  309  --   --   --   --   --   --   --   APTT  --   --   --   --   < > 55*  --  41*  --  >200* 45*  HEPARINUNFRC  --   --   --   --   --  0.73*  --   --   --  1.58* 0.51  CREATININE 0.99  --   --   --   --  1.01  --   --   --   --   --   CKTOTAL  --  148  --   --   --   --   --   --   --   --   --   TROPONINI  --   --   < >  --   --  <0.03 <0.03  --  <0.03 <0.03  --   < > = values in this interval not displayed.  Estimated Creatinine Clearance: 102.8 mL/min (by C-G formula based on SCr of 1.01 mg/dL).   Medical History: Past Medical History:  Diagnosis Date  . Chest pain   . CHF (congestive heart failure) (HCC)   . Edema   . HTN (hypertension)   . Hyperlipidemia   . Neuropathy (HCC)   . Obesity   . SOB (shortness of breath)   . Stroke [facility](HCC)     Medications:  Prescriptions Prior to Admission  Medication Sig Dispense Refill Last Dose  . ALPRAZolam (XANAX) 0.5 MG tablet Take 0.5 mg by mouth at bedtime.    04/27/2016 at Unknown time  . apixaban (ELIQUIS) 5 MG TABS tablet Take 1 tablet (5 mg total) by mouth 2 (two) times daily. 60 tablet 0 04/27/2016 at 2200  . atorvastatin (LIPITOR) 40 MG tablet Take 1 tablet (40 mg total) by mouth daily  at 6 PM. 30 tablet 0 04/27/2016 at Unknown time  . buPROPion (WELLBUTRIN SR) 150 MG 12 hr tablet Take 450 mg by mouth daily.    04/27/2016 at Unknown time  . furosemide (LASIX) 40 MG tablet Take 40 mg by mouth daily.   04/27/2016 at Unknown time  . gabapentin (NEURONTIN) 800 MG tablet Take 800 mg by mouth 3 (three) times daily.    04/27/2016 at Unknown time  . hydrOXYzine (ATARAX/VISTARIL) 25 MG tablet Take 25 mg by mouth 3 (three) times daily as needed.   Past  Month at Unknown time  . metoprolol tartrate (LOPRESSOR) 25 MG tablet Take 0.5 tablets (12.5 mg total) by mouth 2 (two) times daily. (Patient taking differently: Take 25 mg by mouth 2 (two) times daily. ) 30 tablet 0 04/27/2016 at 2200  . oxycodone (OXY-IR) 5 MG capsule Take 5 mg by mouth every 4 (four) hours as needed for pain.    04/27/2016 at Unknown time  . sertraline (ZOLOFT) 100 MG tablet Take 150 mg by mouth daily.    04/27/2016 at Unknown time  . traMADol (ULTRAM) 50 MG tablet Take 50 mg by mouth every 6 (six) hours as needed for moderate pain.    Past Month at Unknown time   Scheduled:  . ALPRAZolam  0.5 mg Oral QHS  . atorvastatin  40 mg Oral q1800  . buPROPion  450 mg Oral Daily  . ceFEPime (MAXIPIME) IV  2 g Intravenous Q12H  . gabapentin  800 mg Oral TID  . sertraline  150 mg Oral Daily  . sodium chloride flush  3 mL Intravenous Q12H  . vancomycin  750 mg Intravenous Q12H   Infusions:  . sodium chloride 100 mL/hr at 04/29/16 1030  . sodium chloride 100 mL/hr at 04/30/16 0555  . heparin 1,600 Units/hr (04/30/16 0223)    Assessment: 70yo male with history of Afib on eliquis, HTN, HLD, OSA and CVA presents with syncope. Pharmacy is consulted to dose heparin for ACS/chest pain.   Levels this morning were supratherapeutic but drawn from same hand as heparin was infusing. Will reorder levels to verify. Repeat aPTT is subtherapeutic at 45 sec on heparin 1600 units/hr. Nurse reports no issues with infusion or bleeding.  Goal of  Therapy:  Heparin level 0.3-0.7 units/ml aPTT 66-102 seconds Monitor platelets by anticoagulation protocol: Yes   Plan:  Heparin 4000 unit bolus Increase heparin infusion to 2000 units/hr Check HL and CBC daily  Arlean Hopping. Newman Pies, PharmD, BCPS Clinical Pharmacist [phone] 04/30/2016,7:56 AM

## 2016-04-30 NOTE — H&P (View-Only) (Signed)
Patient Name: Jorge [name] Date of Encounter: 04/30/2016  Primary Cardiologist: Dr. Christeen Douglas Problem List     Active Problems:   Dyspnea   Aortic stenosis   History of CEA (carotid endarterectomy)   Chest pain   CAD (coronary artery disease)   History of tobacco abuse   HLD (hyperlipidemia)   CHF (congestive heart failure) (HCC)   Syncope   Hypotension   NSTEMI (non-ST elevated myocardial infarction) (HCC)     Subjective   Lethargic. No chest pain or syncope. Awaiting heart cath later today.   Inpatient Medications    Scheduled Meds: . ALPRAZolam  0.5 mg Oral QHS  . atorvastatin  40 mg Oral q1800  . buPROPion  450 mg Oral Daily  . ceFEPime (MAXIPIME) IV  2 g Intravenous Q12H  . gabapentin  800 mg Oral TID  . heparin  4,000 Units Intravenous Once  . sertraline  150 mg Oral Daily  . sodium chloride flush  3 mL Intravenous Q12H  . vancomycin  750 mg Intravenous Q12H   Continuous Infusions: . sodium chloride 100 mL/hr at 04/29/16 1030  . sodium chloride 100 mL/hr at 04/30/16 0555  . heparin 1,600 Units/hr (04/30/16 0223)   PRN Meds: acetaminophen **OR** acetaminophen, alum & mag hydroxide-simeth, ondansetron **OR** ondansetron (ZOFRAN) IV, senna-docusate   Vital Signs    Vitals:   04/29/16 2010 04/30/16 0021 04/30/16 0632 04/30/16 0839  BP: (!) 118/48 (!) 120/50 (!) 94/59 (!) 99/50  Pulse: 74 88 85 75  Resp: 18 18 20 18   Temp: 98.1 F (36.7 C) 97.9 F (36.6 C) 98 F (36.7 C) 98.1 F (36.7 C)  TempSrc: Oral Oral Oral Oral  SpO2: 99% 99% 95% 93%  Weight:   (!) 317 lb (143.8 kg)   Height:        Intake/Output Summary (Last 24 hours) at 04/30/16 1016 Last data filed at 04/30/16 0857  Gross per 24 hour  Intake           566.14 ml  Output             3950 ml  Net         -3383.86 ml   Filed Weights   04/29/16 0618 04/29/16 1245 04/30/16 0632  Weight: 145 lb 6.4 oz (66 kg) (!) 323 lb 11.2 oz (146.8 kg) (!) 317 lb (143.8 kg)    Physical  Exam   GEN: Well nourished, well developed, in no acute distress. morbidly obese. lethargic HEENT: Grossly normal.  Neck: Supple, no JVD, left  carotid bruits, or masses. Cardiac: RRR, AS murmurs, rubs, or gallops. No clubbing, cyanosis, 2-3/6 harsh systolic murmur of AS.  Radials/DP/PT 2+ and equal bilaterally.  Respiratory:  Respirations regular and unlabored, clear to auscultation bilaterally. GI: Soft, nontender, nondistended, BS + x 4. MS: no deformity or atrophy. Skin: warm and dry, no rash. Neuro:  Strength and sensation are intact. Psych: AAOx3.  Normal affect.  Labs    CBC  Recent Labs  04/28/16 1249 04/29/16 0856  WBC 14.2* 9.7  HGB 13.7 12.8*  HCT 42.8 40.1  MCV 94.5 95.0  PLT 306 309   Basic Metabolic Panel  Recent Labs  04/28/16 1209 04/29/16 1145  NA 138 138  K 3.7 3.7  CL 103 107  CO2 26 24  GLUCOSE 132* 141*  BUN 11 11  CREATININE 0.99 1.01  CALCIUM 8.9 8.2*   Liver Function Tests  Recent Labs  04/28/16 1209  AST 21  ALT 17  ALKPHOS 219*  BILITOT 0.5  PROT 7.3  ALBUMIN 3.8   No results for input(s): LIPASE, AMYLASE in the last 72 hours. Cardiac Enzymes  Recent Labs  04/28/16 1249  04/29/16 1653 04/29/16 2325 04/30/16 0547  CKTOTAL 148  --   --   --   --   TROPONINI  --   < > <0.03 <0.03 <0.03  < > = values in this interval not displayed. BNP Invalid input(s): POCBNP D-Dimer  Recent Labs  04/28/16 1550  DDIMER 3.28*   Hemoglobin A1C No results for input(s): HGBA1C in the last 72 hours. Fasting Lipid Panel No results for input(s): CHOL, HDL, LDLCALC, TRIG, CHOLHDL, LDLDIRECT in the last 72 hours. Thyroid Function Tests No results for input(s): TSH, T4TOTAL, T3FREE, THYROIDAB in the last 72 hours.  Invalid input(s): FREET3  Telemetry    NSR - Personally Reviewed  ECG    NSR. LAD - Personally Reviewed  Radiology    Ct Angio Head W Or Wo Contrast  Result Date: 04/29/2016 CLINICAL DATA:  Initial evaluation for  acute syncope. EXAM: CT ANGIOGRAPHY HEAD AND NECK TECHNIQUE: Multidetector CT imaging of the head and neck was performed using the standard protocol during bolus administration of intravenous contrast. Multiplanar CT image reconstructions and MIPs were obtained to evaluate the vascular anatomy. Carotid stenosis measurements (when applicable) are obtained utilizing NASCET criteria, using the distal internal carotid diameter as the denominator. CONTRAST:  110 cc of Isovue 370. COMPARISON:  Prior head CT from 04/28/16. FINDINGS: CTA NECK FINDINGS Aortic arch: Visualized aortic arch of normal caliber with normal 3 vessel morphology. Mild atheromatous plaque within the arch itself. No high-grade stenosis about the origin of the great vessels. Focal kinking with atheromatous plaque at the proximal right subclavian artery just be on the origin of the right common carotid artery. Secondary short-segment stenosis of approximately 70-80% (series 7, image 326). Subclavian arteries otherwise widely patent. Right carotid system: Right common carotid artery tortuous proximally but widely patent to the bifurcation. Atheromatous plaque about the right bifurcation/proximal right ICA. Associated multifocal irregularity with stenosis of up to 40-50% by NASCET criteria. Superimposed small penetrating plaque noted within this region (series 7, image 227). Distally, right ICA widely patent to the skullbase. No made of atheromatous plaque at the origin of the right external carotid artery without flow-limiting stenosis. Left carotid system: Left common carotid artery patent from its origin to the bifurcation. Eccentric calcified plaque about the left bifurcation/proximal left ICA with associated narrowing of approximately 30% by NASCET criteria. There is a irregular curvilinear filling defect within the proximal left ICA, likely a small dissection flap (series 7, image 210). No associated flow-limiting stenosis or significant intraluminal  thrombus. This is of unknown chronicity. Distally, left ICA widely patent to the skullbase without additional stenosis. Vertebral arteries: Both of the vertebral arteries arise from the subclavian arteries. Vertebral arteries somewhat poorly evaluated proximally due to body habitus. Vertebral arteries patent within the neck without flow-limiting stenosis, dissection, or occlusion. Skeleton: No acute osseous abnormality. No worrisome lytic or blastic osseous lesions. Other neck: No acute soft tissue abnormality within the neck. No adenopathy. Upper chest: Visualized upper mediastinum within normal limits. Visualized lung apices are clear. Review of the MIP images confirms the above findings CTA HEAD FINDINGS Anterior circulation: Petrous segments widely patent bilaterally. Scattered atheromatous plaque within the cavernous ICAs without flow-limiting stenosis. A1 segments patent. Anterior communicating artery patent. Anterior cerebral arteries demonstrate mild multifocal atheromatous irregularity but are patent  to their distal aspects. M1 segments widely patent without stenosis or occlusion. MCA bifurcations normal. Distal MCA branches well opacified and symmetric. Mild distal small vessel atheromatous irregularity. Posterior circulation: Vertebral arteries patent to the vertebrobasilar junction without flow-limiting stenosis. Posterior inferior cerebral arteries patent proximally. Basilar artery widely patent without stenosis or occlusion. Superior cerebral arteries patent bilaterally. Left PCA supplied via the basilar and is well opacified to its distal aspect. Fetal type right PCA supplied via a widely patent right P com. Right PCA also patent to its distal aspect. Mild distal small vessel atheromatous irregularity. Venous sinuses: Patent. Anatomic variants: Fetal type right PCA. No aneurysm or vascular malformation. Delayed phase: No pathologic enhancement. Review of the MIP images confirms the above findings  IMPRESSION: CTA NECK IMPRESSION: 1. 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 Finding is of unknown chronicity. 2. 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 3. 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 4. Widely patent vertebral arteries within the neck. CTA HEAD IMPRESSION: 1. Negative CTA of the intracranial circulation for large vessel occlusion or high-grade correctable stenosis. 2. Mild distal small vessel atheromatous irregularity involving both the anterior and posterior circulations. 3. Atheromatous plaque within the carotid siphons without significant stenosis. Electronically Signed   By: Rise Mu M.D.   On: 04/29/2016 04:28   Dg Chest 1 View  Result Date: 04/28/2016 CLINICAL DATA:  Syncopal episode while shaving this morning. LEFT arm pain. History of recent humerus fracture. EXAM: CHEST 1 VIEW COMPARISON:  Chest radiograph March 21, 2016 FINDINGS: Cardiac silhouette is upper limits of normal in size. Mediastinal silhouette is nonsuspicious, mildly calcified aortic knob. Persistently elevated RIGHT hemidiaphragm with bibasilar strandy densities. Rounded density projects in RIGHT lung apex. No pneumothorax. Soft tissue planes and included osseous structures are nonsuspicious. IMPRESSION: Rounded density projecting RIGHT lung apex could represent EEG lead sticker, less likely consolidation/pneumonia. Persistently elevated RIGHT hemidiaphragm with bibasilar atelectasis. Borderline cardiomegaly. Electronically Signed   By: Awilda Metro M.D.   On: 04/28/2016 13:29   Ct Head Wo Contrast  Result Date: 04/28/2016 CLINICAL DATA:  Trauma, fall while walking dog EXAM: CT HEAD WITHOUT CONTRAST CT CERVICAL SPINE WITHOUT CONTRAST TECHNIQUE: Multidetector CT imaging of the head and cervical spine was performed following the standard  protocol without intravenous contrast. Multiplanar CT image reconstructions of the cervical spine were also generated. COMPARISON:  CT head dated 03/19/2016. CT head/ cervical spine dated 03/16/2016. FINDINGS: CT HEAD FINDINGS Brain: No evidence of acute infarction, hemorrhage, hydrocephalus, extra-axial collection or mass lesion/mass effect. Subcortical white matter and periventricular small vessel ischemic changes. Global cortical atrophy. Vascular: Mild intracranial atherosclerosis. Skull: Normal. Negative for fracture or focal lesion. Sinuses/Orbits: Mild partial opacification of the right maxillary sinus. Visualized paranasal sinuses and mastoid air cells are otherwise clear. Other: None. CT CERVICAL SPINE FINDINGS Alignment: Normal cervical lordosis. Skull base and vertebrae: No acute fracture. No primary bone lesion or focal pathologic process. Soft tissues and spinal canal: No prevertebral fluid or swelling. No visible canal hematoma. Disc levels: Mild degenerative changes. Spinal canal remains patent. Upper chest: Visualized lung apices are essentially clear. Other: Visualized thyroid is unremarkable. IMPRESSION: No evidence of acute intracranial abnormality. Atrophy with small vessel ischemic changes. No evidence of traumatic injury to the cervical spine. Mild degenerative changes. Electronically Signed   By: Charline Bills M.D.   On: 04/28/2016 13:16   Ct  Angio Neck W Or Wo Contrast  Result Date: 04/29/2016 CLINICAL DATA:  Initial evaluation for acute syncope. EXAM: CT ANGIOGRAPHY HEAD AND NECK TECHNIQUE: Multidetector CT imaging of the head and neck was performed using the standard protocol during bolus administration of intravenous contrast. Multiplanar CT image reconstructions and MIPs were obtained to evaluate the vascular anatomy. Carotid stenosis measurements (when applicable) are obtained utilizing NASCET criteria, using the distal internal carotid diameter as the denominator. CONTRAST:  110  cc of Isovue 370. COMPARISON:  Prior head CT from 04/28/16. FINDINGS: CTA NECK FINDINGS Aortic arch: Visualized aortic arch of normal caliber with normal 3 vessel morphology. Mild atheromatous plaque within the arch itself. No high-grade stenosis about the origin of the great vessels. Focal kinking with atheromatous plaque at the proximal right subclavian artery just be on the origin of the right common carotid artery. Secondary short-segment stenosis of approximately 70-80% (series 7, image 326). Subclavian arteries otherwise widely patent. Right carotid system: Right common carotid artery tortuous proximally but widely patent to the bifurcation. Atheromatous plaque about the right bifurcation/proximal right ICA. Associated multifocal irregularity with stenosis of up to 40-50% by NASCET criteria. Superimposed small penetrating plaque noted within this region (series 7, image 227). Distally, right ICA widely patent to the skullbase. No made of atheromatous plaque at the origin of the right external carotid artery without flow-limiting stenosis. Left carotid system: Left common carotid artery patent from its origin to the bifurcation. Eccentric calcified plaque about the left bifurcation/proximal left ICA with associated narrowing of approximately 30% by NASCET criteria. There is a irregular curvilinear filling defect within the proximal left ICA, likely a small dissection flap (series 7, image 210). No associated flow-limiting stenosis or significant intraluminal thrombus. This is of unknown chronicity. Distally, left ICA widely patent to the skullbase without additional stenosis. Vertebral arteries: Both of the vertebral arteries arise from the subclavian arteries. Vertebral arteries somewhat poorly evaluated proximally due to body habitus. Vertebral arteries patent within the neck without flow-limiting stenosis, dissection, or occlusion. Skeleton: No acute osseous abnormality. No worrisome lytic or blastic osseous  lesions. Other neck: No acute soft tissue abnormality within the neck. No adenopathy. Upper chest: Visualized upper mediastinum within normal limits. Visualized lung apices are clear. Review of the MIP images confirms the above findings CTA HEAD FINDINGS Anterior circulation: Petrous segments widely patent bilaterally. Scattered atheromatous plaque within the cavernous ICAs without flow-limiting stenosis. A1 segments patent. Anterior communicating artery patent. Anterior cerebral arteries demonstrate mild multifocal atheromatous irregularity but are patent to their distal aspects. M1 segments widely patent without stenosis or occlusion. MCA bifurcations normal. Distal MCA branches well opacified and symmetric. Mild distal small vessel atheromatous irregularity. Posterior circulation: Vertebral arteries patent to the vertebrobasilar junction without flow-limiting stenosis. Posterior inferior cerebral arteries patent proximally. Basilar artery widely patent without stenosis or occlusion. Superior cerebral arteries patent bilaterally. Left PCA supplied via the basilar and is well opacified to its distal aspect. Fetal type right PCA supplied via a widely patent right P com. Right PCA also patent to its distal aspect. Mild distal small vessel atheromatous irregularity. Venous sinuses: Patent. Anatomic variants: Fetal type right PCA. No aneurysm or vascular malformation. Delayed phase: No pathologic enhancement. Review of the MIP images confirms the above findings IMPRESSION: CTA NECK IMPRESSION: 1. 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 Finding is of unknown chronicity. 2. 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 3. Short-segment  fairly severe proximal right subclavian artery stenosis measuring up to 70-80%. 4. Widely patent vertebral arteries within the neck. CTA HEAD IMPRESSION: 1. Negative CTA of the  intracranial circulation for large vessel occlusion or high-grade correctable stenosis. 2. Mild distal small vessel atheromatous irregularity involving both the anterior and posterior circulations. 3. Atheromatous plaque within the carotid siphons without significant stenosis. Electronically Signed   By: Rise Mu M.D.   On: 04/29/2016 04:28   Ct Angio Chest Pe W Or Wo Contrast  Result Date: 04/29/2016 CLINICAL DATA:  71 year old male with elevated D-dimer. EXAM: CT ANGIOGRAPHY CHEST WITH CONTRAST TECHNIQUE: Multidetector CT imaging of the chest was performed using the standard protocol during bolus administration of intravenous contrast. Multiplanar CT image reconstructions and MIPs were obtained to evaluate the vascular anatomy. CONTRAST:  100 cc Isovue 370 COMPARISON:  Chest radiograph dated 04/28/2016 FINDINGS: Cardiovascular: There is mild cardiomegaly. No pericardial effusion. Mild coronary vascular calcification predominantly involving the LAD. Mild atherosclerotic calcification of the thoracic aorta. No aneurysmal dilatation or evidence of dissection. The origins of the great vessels of the aortic arch appear patent. Evaluation of the pulmonary arteries is limited due to suboptimal opacification of the distal branches. No large central pulmonary artery embolus identified. Mediastinum/Nodes: There is no hilar or mediastinal adenopathy. The esophagus is grossly unremarkable. Lungs/Pleura: Right lung base subsegmental atelectatic changes noted. Infiltrate is less likely but not excluded. Clinical correlation is recommended. There is no pleural effusion or pneumothorax. The central airways are patent. Upper Abdomen: There is diffuse fatty infiltration of the liver. Slight irregularity of the hepatic contour concerning for cirrhosis. There is mild eventration of the right hemidiaphragm. Musculoskeletal: Mild degenerative changes of the spine. No acute fracture. Review of the MIP images confirms the  above findings. IMPRESSION: No CT evidence of central pulmonary artery embolus. Mild elevation of the right hemidiaphragm with right lung base subsegmental atelectasis/ scarring. Infiltrate is less likely. Clinical correlation is recommended. Electronically Signed   By: Elgie Collard M.D.   On: 04/29/2016 04:17   Ct Cervical Spine Wo Contrast  Result Date: 04/28/2016 CLINICAL DATA:  Trauma, fall while walking dog EXAM: CT HEAD WITHOUT CONTRAST CT CERVICAL SPINE WITHOUT CONTRAST TECHNIQUE: Multidetector CT imaging of the head and cervical spine was performed following the standard protocol without intravenous contrast. Multiplanar CT image reconstructions of the cervical spine were also generated. COMPARISON:  CT head dated 03/19/2016. CT head/ cervical spine dated 03/16/2016. FINDINGS: CT HEAD FINDINGS Brain: No evidence of acute infarction, hemorrhage, hydrocephalus, extra-axial collection or mass lesion/mass effect. Subcortical white matter and periventricular small vessel ischemic changes. Global cortical atrophy. Vascular: Mild intracranial atherosclerosis. Skull: Normal. Negative for fracture or focal lesion. Sinuses/Orbits: Mild partial opacification of the right maxillary sinus. Visualized paranasal sinuses and mastoid air cells are otherwise clear. Other: None. CT CERVICAL SPINE FINDINGS Alignment: Normal cervical lordosis. Skull base and vertebrae: No acute fracture. No primary bone lesion or focal pathologic process. Soft tissues and spinal canal: No prevertebral fluid or swelling. No visible canal hematoma. Disc levels: Mild degenerative changes. Spinal canal remains patent. Upper chest: Visualized lung apices are essentially clear. Other: Visualized thyroid is unremarkable. IMPRESSION: No evidence of acute intracranial abnormality. Atrophy with small vessel ischemic changes. No evidence of traumatic injury to the cervical spine. Mild degenerative changes. Electronically Signed   By: Charline Bills M.D.   On: 04/28/2016 13:16   Dg Chest Port 1 View  Result Date: 04/29/2016 CLINICAL DATA:  Acute onset shortness of Breath  EXAM: PORTABLE CHEST 1 VIEW COMPARISON:  04/29/2016, 04/28/2016 FINDINGS: Cardiac shadow is again enlarged but stable. Elevation of the right hemidiaphragm is again noted with mild atelectatic changes. No focal confluent infiltrate is seen. No other focal abnormality is noted IMPRESSION: Stable right basilar atelectasis and elevation the right hemidiaphragm. No acute abnormality is seen. Electronically Signed   By: Alcide Clever M.D.   On: 04/29/2016 09:20   Dg Humerus Left  Result Date: 04/28/2016 CLINICAL DATA:  Syncopal episode while shaving this morning. LEFT arm pain. History of recent humerus fracture. EXAM: LEFT HUMERUS - 2+ VIEW COMPARISON:  LEFT humerus fracture March 20, 2016 FINDINGS: Healing displaced proximal humerus fracture with callus, rarefaction of the fracture line. No new fracture deformity. No dislocation. Distal humerus in fiberglass cast. No destructive bony lesions. Soft tissue planes are nonsuspicious. IMPRESSION: Healing displaced proximal LEFT humerus fracture ; no new fracture deformity or dislocation. Electronically Signed   By: Awilda Metro M.D.   On: 04/28/2016 13:31    Cardiac Studies   2D ECHO 04/29/2016 LV EF: 55% -   60% Study Conclusions - Left ventricle: The cavity size was normal. Wall thickness was   increased in a pattern of moderate LVH. Systolic function was   normal. The estimated ejection fraction was in the range of 55%   to 60%. Wall motion was normal; there were no regional wall   motion abnormalities. Doppler parameters are consistent with   abnormal left ventricular relaxation (grade 1 diastolic   dysfunction). Doppler parameters are consistent with high   ventricular filling pressure. - Aortic valve: There was severe stenosis. There was trivial   regurgitation. - Left atrium: The atrium was moderately  dilated. - Pulmonary arteries: Systolic pressure was mildly increased. PA   peak pressure: 42 mm Hg (S). Impressions: - Technically difficult; definity used; normal LV systolic   function; grade 1 diastolic dysfunction with elevated LV filling   pressure; moderate LVH; calcified aortic valve with severe AS;   moderate LAE; mildly elevated pulmonary pressure.  Patient Profile     Mr. Taillon is a 71 year old male with a past medical history of PAF (on Eliquis), carotid artery disease s/p L CEA (2014), HTN, HLD, obesity, OSA, moderate AS, and CVA who presented to the [facility] ED on 04/28/16 with syncope.   Assessment & Plan    1. Syncope/NSTEMI: patient has had 2 syncopal episodes in the past 2 months. Both of which sound like true cardiac syncope. First Troponin I elevated to 0.15, but subsequent troponin negative. ECHO this admission showed progression of AS from moderate to severe. He has RFs for CAD including family history, age, obesity.   Will need an ischemic evaluation and further evaluation of aortic gradients with left and right heart cath, last dose of Eliquis was 04/28/16 morning. Started on IV heparin. Plan for cath today.   2. Severe AS: repeat echo this admission shows severe AS.  Mean gradient 41 mmHg and peak 70 mmHg Will further investigate with right heart cath and left to assess cors. He will likely need valve replacement  3. History of CVA: continue statin   4. Carotid artery stenosis:  S/p L CEA in 2014.  Follows with Dr. Darrick Penna. Last seen in Feb. 2016, duplex showed right ICA stenosis of 60-79%.  . 5. HTN: BPs have been low. Treated with IVFs and holding lasix.    Charlton Haws

## 2016-04-30 NOTE — Progress Notes (Signed)
Pt HR went up to 140's. Currently in Atrial Fibrilation. Patient complains of chest pain 3/10. EKG done, MD, cardiology made aware. No orders at this moment. Will continue to monitor.   Tomasina Keasling, RN

## 2016-05-01 ENCOUNTER — Encounter (HOSPITAL_COMMUNITY): Payer: Self-pay

## 2016-05-01 ENCOUNTER — Encounter (HOSPITAL_COMMUNITY): Payer: Self-pay | Admitting: Cardiovascular Disease

## 2016-05-01 ENCOUNTER — Other Ambulatory Visit: Payer: Self-pay | Admitting: *Deleted

## 2016-05-01 ENCOUNTER — Inpatient Hospital Stay (HOSPITAL_COMMUNITY): Payer: Medicare Other

## 2016-05-01 DIAGNOSIS — I10 Essential (primary) hypertension: Secondary | ICD-10-CM

## 2016-05-01 DIAGNOSIS — Z01818 Encounter for other preprocedural examination: Secondary | ICD-10-CM

## 2016-05-01 DIAGNOSIS — I350 Nonrheumatic aortic (valve) stenosis: Secondary | ICD-10-CM

## 2016-05-01 LAB — GLUCOSE, CAPILLARY
Glucose-Capillary: 95 mg/dL (ref 65–99)
Glucose-Capillary: 160 mg/dL — ABNORMAL HIGH (ref 65–99)

## 2016-05-01 LAB — BASIC METABOLIC PANEL
Anion gap: 11 (ref 5–15)
BUN: 6 mg/dL (ref 6–20)
CO2: 26 mmol/L (ref 22–32)
CREATININE: 0.88 mg/dL (ref 0.61–1.24)
Calcium: 8.6 mg/dL — ABNORMAL LOW (ref 8.9–10.3)
Chloride: 104 mmol/L (ref 101–111)
GFR calc Af Amer: >60 mL/min (ref >60)
GLUCOSE: 107 mg/dL — AB (ref 65–99)
POTASSIUM: 3.7 mmol/L (ref 3.5–5.1)
Sodium: 141 mmol/L (ref 135–145)

## 2016-05-01 LAB — SURGICAL PCR SCREEN
MRSA, PCR: NEGATIVE
Staphylococcus aureus: POSITIVE — AB

## 2016-05-01 LAB — CBC
HCT: 41.6 % (ref 39.0–52.0)
Hemoglobin: 13.4 g/dL (ref 13.0–17.0)
MCH: 30.5 pg (ref 26.0–34.0)
MCHC: 32.2 g/dL (ref 30.0–36.0)
MCV: 94.5 fL (ref 78.0–100.0)
PLATELETS: 290 10*3/uL (ref 150–400)
RBC: 4.4 MIL/uL (ref 4.22–5.81)
RDW: 14.5 % (ref 11.5–15.5)
WBC: 9.5 10*3/uL (ref 4.0–10.5)

## 2016-05-01 LAB — HEPARIN LEVEL (UNFRACTIONATED): Heparin Unfractionated: 0.6 IU/mL (ref 0.30–0.70)

## 2016-05-01 LAB — APTT: APTT: 63 s — AB (ref 24–36)

## 2016-05-01 MED ORDER — HEPARIN (PORCINE) IN NACL 100-0.45 UNIT/ML-% IJ SOLN: 2000.0000 [IU]/h | INTRAMUSCULAR | Status: DC

## 2016-05-01 MED ORDER — HEPARIN (PORCINE) IN NACL 100-0.45 UNIT/ML-% IJ SOLN
2000.0000 [IU]/h | INTRAMUSCULAR | Status: DC
  Administered 2016-05-01: 2000 [IU]/h via INTRAVENOUS
  Filled 2016-05-01: qty 250

## 2016-05-01 MED ORDER — BISACODYL 5 MG PO TBEC
5.0000 mg | DELAYED_RELEASE_TABLET | Freq: Every day | ORAL | Status: DC | PRN
  Administered 2016-05-03: 5 mg via ORAL
  Filled 2016-05-01: qty 1

## 2016-05-01 MED ORDER — CEFAZOLIN SODIUM 10 G IJ SOLR
3.0000 g | Freq: Once | INTRAMUSCULAR | Status: AC
  Administered 2016-05-02: 3 g via INTRAVENOUS
  Filled 2016-05-01: qty 3000

## 2016-05-01 MED ORDER — ORAL CARE MOUTH RINSE
15.0000 mL | Freq: Two times a day (BID) | OROMUCOSAL | Status: DC
  Administered 2016-05-01 – 2016-05-08 (×13): 15 mL via OROMUCOSAL

## 2016-05-01 NOTE — Progress Notes (Signed)
Diltiazem 5 mg continuos ordered per cardiology. Will continue to monitor.   Talecia Sherlin, RN

## 2016-05-01 NOTE — Progress Notes (Signed)
ANTICOAGULATION CONSULT NOTE - f/u Consult  Pharmacy Consult for Heparin Indication: chest pain/ACS  Allergies  Allergen Reactions  . Coumadin [Warfarin Sodium] Rash    Patient Measurements: Height: 6\' 2"  (188 cm) Weight: (!) 312 lb 14.4 oz (141.9 kg) (scale c) IBW/kg (Calculated) : 82.2 Heparin Dosing Weight: 115.4 kg  Vital Signs: Temp: 97.7 F (36.5 C) (01/25 0539) Temp Source: Oral (01/25 0539) BP: 95/52 (01/25 0631) Pulse Rate: 74 (01/25 0631)  Labs:  Recent Labs  04/28/16 1209  04/28/16 1249  04/29/16 0856  04/29/16 1145  04/29/16 1839 04/29/16 2325 04/30/16 0547 04/30/16 0812 04/30/16 1104 05/01/16 0437  HGB  --   < > 13.7  --  12.8*  --   --   --   --   --   --   --   --  13.4  HCT  --   --  42.8  --  40.1  --   --   --   --   --   --   --   --  41.6  PLT  --   --  306  --  309  --   --   --   --   --   --   --   --  290  APTT  --   --   --   --   --   < > 55*  --  41*  --  >200* 45*  --   --   HEPARINUNFRC  --   --   --   --   --   --  0.73*  --   --   --  1.58* 0.51  --   --   CREATININE 0.99  --   --   --   --   --  1.01  --   --   --   --   --   --  0.88  CKTOTAL  --   --  148  --   --   --   --   --   --   --   --   --   --   --   TROPONINI  --   --   --   < >  --   --  <0.03  < >  --  <0.03 <0.03  --  <0.03  --   < > = values in this interval not displayed.  Estimated Creatinine Clearance: 117.2 mL/min (by C-G formula based on SCr of 0.88 mg/dL).   Medical History: Past Medical History:  Diagnosis Date  . Atrial fibrillation (HCC)    pt on Eliquis  . Chest pain   . CHF (congestive heart failure) (HCC)   . Edema   . HTN (hypertension)   . Hyperlipidemia   . Neuropathy (HCC)   . Obesity   . SOB (shortness of breath)   . Stroke [facility])     Medications:  Prescriptions Prior to Admission  Medication Sig Dispense Refill Last Dose  . ALPRAZolam (XANAX) 0.5 MG tablet Take 0.5 mg by mouth at bedtime.    04/27/2016 at Unknown time  . apixaban  (ELIQUIS) 5 MG TABS tablet Take 1 tablet (5 mg total) by mouth 2 (two) times daily. 60 tablet 0 04/27/2016 at 2200  . atorvastatin (LIPITOR) 40 MG tablet Take 1 tablet (40 mg total) by mouth daily at 6 PM. 30 tablet 0 04/27/2016 at Unknown time  . buPROPion [facility] SR)  150 MG 12 hr tablet Take 450 mg by mouth daily.    04/27/2016 at Unknown time  . furosemide (LASIX) 40 MG tablet Take 40 mg by mouth daily.   04/27/2016 at Unknown time  . gabapentin (NEURONTIN) 800 MG tablet Take 800 mg by mouth 3 (three) times daily.    04/27/2016 at Unknown time  . hydrOXYzine (ATARAX/VISTARIL) 25 MG tablet Take 25 mg by mouth 3 (three) times daily as needed.   Past Month at Unknown time  . metoprolol tartrate (LOPRESSOR) 25 MG tablet Take 0.5 tablets (12.5 mg total) by mouth 2 (two) times daily. (Patient taking differently: Take 25 mg by mouth 2 (two) times daily. ) 30 tablet 0 04/27/2016 at 2200  . oxycodone (OXY-IR) 5 MG capsule Take 5 mg by mouth every 4 (four) hours as needed for pain.    04/27/2016 at Unknown time  . sertraline (ZOLOFT) 100 MG tablet Take 150 mg by mouth daily.    04/27/2016 at Unknown time  . traMADol (ULTRAM) 50 MG tablet Take 50 mg by mouth every 6 (six) hours as needed for moderate pain.    Past Month at Unknown time   Scheduled:  . ALPRAZolam  0.5 mg Oral QHS  . atorvastatin  40 mg Oral q1800  . buPROPion  450 mg Oral Daily  . gabapentin  800 mg Oral TID  . mouth rinse  15 mL Mouth Rinse BID  . sertraline  150 mg Oral Daily  . sodium chloride flush  3 mL Intravenous Q12H  . sodium chloride flush  3 mL Intravenous Q12H   Infusions:  . diltiazem (CARDIZEM) infusion 5 mg/hr (05/01/16 0057)    Assessment: 71yo male with history of Afib on eliquis, HTN, HLD, OSA and CVA presents with syncope. Pharmacy is consulted to dose heparin for ACS/chest pain.   Heparin was stopped after cath yesterday evening. Will restart while awaiting CT surgery consult. Will resume last dose of heparin 2000  units/hr.  Goal of Therapy:  Heparin level 0.3-0.7 units/ml aPTT 66-102 seconds Monitor platelets by anticoagulation protocol: Yes   Plan:  Restart heparin infusion h2000 units/hr 8h aPTT/HL Check HL and CBC daily F/u surgery rec  Arlean Hoppingorey M. Newman PiesBall, PharmD, BCPS Clinical Pharmacist [phone]  05/01/2016,11:04 AM

## 2016-05-01 NOTE — Progress Notes (Signed)
Pt has converted back to NSR in the 80s-90s from A fib in the 120s-140s. Will notify pt's RN.

## 2016-05-01 NOTE — Progress Notes (Signed)
Occupational Therapy Treatment Patient Details Name: Jorge DroughtGeorge O [name] MRN: [id number] DOB: 1945-04-11 Today's Date: 05/01/2016    History of present illness Jorge [name] is a 71 y.o. male  who presents with syncope. Cardiac vs neck/intracranial vascular vs PE. Pt w/ h/o falls including fall ~1 month ago resulting in LUE fracture - pt with cast and then a fall at home after "blacking out" & bumping his head in bathroom after shaving on day of admit.   OT comments  Sat EOB x 2, tolerance limited by symptomatic hypotension. Performed UB bathing in sitting, dressing and grooming at bed level.  Improvement noted in bed mobility. Pt reports he fractured his L UE 3 months ago and was to have a follow up appointment with [place] orthoped[place] in [place] in [place]. Not able to confirm the date of his injury, but may benefit from in-house orthopedic consult to determine if cast can be removed.  Follow Up Recommendations  SNF;Supervision/Assistance - 24 hour    Equipment Recommendations       Recommendations for Other Services      Precautions / Restrictions Precautions Precautions: Fall Precaution Comments: syncope Restrictions Weight Bearing Restrictions: No       Mobility Bed Mobility Overal bed mobility: Needs Assistance Bed Mobility: Sit to Supine;Supine to Sit     Supine to sit: Mod assist Sit to supine: Min guard   General bed mobility comments: Assist to elevate trunk into sitting. Assist to guide shoulders back down into supine.  Transfers                 General transfer comment: Unable due to lightheaded sitting EOB, [facility] while seated    Balance Overall balance assessment: Needs assistance;History of Falls (Pt becomes very light headed with sitting EOB. ) Sitting-balance support: Feet supported;Single extremity supported Sitting balance-Leahy Scale: Poor Sitting balance - Comments: Pt sat EOB x 90 sec on first attempt and 3-4  minutes on 2nd attempt. Pt limited by lightheadedness. Supervision only for safety due to lightheadedness and hx of syncope. Otherwise pt with stable sitting balance.                           ADL Overall ADL's : Needs assistance/impaired     Grooming: Wash/dry hands;Wash/dry face;Bed level;Set up   Upper Body Bathing: Minimal assistance;Sitting Upper Body Bathing Details (indicate cue type and reason): assist to wash back     Upper Body Dressing : Minimal assistance;Bed level Upper Body Dressing Details (indicate cue type and reason): changed soiled gown                   General ADL Comments: Pt limited by low BP.      Vision                     Perception     Praxis      Cognition   Behavior During Therapy: [facility] for tasks assessed/performed Overall Cognitive Status: Within Functional Limits for tasks assessed                       Extremity/Trunk Assessment               Exercises     Shoulder Instructions       General Comments      Pertinent Vitals/ Pain       Pain  Assessment: 0-10 Pain Score: 6  Pain Location: head Pain Descriptors / Indicators: Aching Pain Intervention(s): Monitored during session;Repositioned;Patient requesting pain meds-RN notified  Home Living                                          Prior Functioning/Environment              Frequency  Min 2X/week        Progress Toward Goals  OT Goals(current goals can now be found in the care plan section)  Progress towards OT goals: Not progressing toward goals - comment (medical status)  Acute Rehab OT Goals Patient Stated Goal: Get better Time For Goal Achievement: 05/13/16 Potential to Achieve Goals: Good  Plan Discharge plan remains appropriate    Co-evaluation                 End of Session Equipment Utilized During Treatment: Oxygen   Activity Tolerance Treatment limited secondary to medical  complications (Comment) (low BP)   Patient Left in bed;with call bell/phone within reach;with nursing/sitter in room   Nurse Communication  (aware of BP)        Time: 1610-9604 OT Time Calculation (min): 50 min  Charges: OT General Charges $OT Visit: 1 Procedure OT Treatments $Therapeutic Activity: 8-22 mins  Evern Bio 05/01/2016, 11:59 AM  [phone]

## 2016-05-01 NOTE — Progress Notes (Signed)
PROGRESS NOTE    Jorge DroughtGeorge O Lagerstrom  [id number] DOB: 11/28/1945 DOA: 04/28/2016 PCP: Lonie PeakNathan Conroy, PA-C   Chief Complaint  Patient presents with  . Loss of Consciousness    Brief Narrative:  HPI on 04/28/2016 by Ms. Marlowe KaysSara Wertman, PA Jorge [name] is a 71 y.o. male with a history of paroxysmal atrial fibrillation on Eliquis , DCHF,  AS,  hypertension, hyperlipidemia, obey CT, prior history of stroke, carotid artery disease, brought to the ED after a syncopal episode. In review, she was shaving around 8:30 this morning, when he turned his head to the right, accompanied by some prodromal symptoms including nausea, and "shinning lights:, falling an hitting his head against the bathtub. He had lost consciousness for several minutes, but remained on the floor till 11 am, when he was found by his caretaker and EMS was called. Patient reported increased anxiety a the time, and complains of vague chest pain, without palpitations. He reports increasing shortness of breath without cough. He denies any jaw pain or left arm pain. He reports having similar dizzy spells over the last year but no presyncopal or syncopal events. No dysarthria or dysphagia. Denies any unilateral weakness or sensory deficiencies.  Denies any confusion. Of note, patient has trouble finding words since his last stroke. No seizures reported.  Denies any fever or chills, or night sweats. Denies any abdominal pain, or diarrhea. Denies any sick contacts or new foods. Denies any recent long distance trips. No recent surgeries. Denies worsening lower extremity swelling. Denies abnormal skin rashes, or neuropathy. Compliant with  medications. Does not smoke.  Assessment & Plan   Hypotension -possibly one cause of patient's syncope (along with aortic stenosis) -Unclear etiology for hypotension.  -upon admission denied fever, cough, chest pain, UR symptoms, no abdominal pain. Had some diarrhea few days ago which has resolved.    -Cardiology consulted and appreciated -Echocardiogram 04/29/2016: EF 55-60%, grade 1 diastolic dysfunction. Severe AS -Cefepime started empirically given that patient also had leukocytosis- discontinued today   Syncope  -D-Dimer 3.28, EKG unrevealing.  -CT head negative for acute intracranial abnormalities. -CT angio chest negative for central PE.  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  Widely patent vertebral arteries within the neck. There is a irregular curvilinear filling defect within the proximal left ICA, likely a small dissection flap (series 7, image 210). -Due to abnormal CT angio neck, vascular surgery consulted and appreciated  Severe aortic stenosis -Possibly cause of patient's syncope. -Noted on Echocardiogram and cardiac cath -CT surgery consulted and appreciated  ?Left ICA dissection -CTA neck as above -Vascular surgery consulted and did not recommend further acute intervention at this time. Patient already on anticoagulation  -s/p L CEA 2014- follows with [name]   Elevated troponin/NSTEMI -Echo as above -R/LHC: mild non-obstructive CAD, severe aortic stenosis (mean gradient 33.427mmHg)  Hypertension  -Hold BP medications as patient was hypotensive.   Paroxysmal Atrial Fibrillation  -CHA2DS2-VASc score 6  -Continue anticoagulation, currently Eliquis held due to Cath.   -Was placed on heparin- but was discontinued yesterday after cath. -Given current Afib, will restart heparin.    Hyperlipidemia -Continue statin  Peripheral Neuropathy -Continue Neurontin   Depression -Continue home Zoloft   Generalized weakness -PT and OT consulted- rec SNF  DVT Prophylaxis  Heparin  Code Status: Full  Family  Communication: none at  bedside  Disposition Plan: Admitted. Pending CT surgery consult regarding aortic stenosis.  Patient will need SNF at discharge.  Consultants Cardiology Vascular surgery  Cardiothoracic surgery  Procedures  Echocardiogram R/LHC  Antibiotics   Anti-infectives    Start     Dose/Rate Route Frequency Ordered Stop   04/29/16 2200  vancomycin (VANCOCIN) IVPB 750 mg/150 ml premix  Status:  Discontinued     750 mg 150 mL/hr over 60 Minutes Intravenous Every 12 hours 04/29/16 0939 05/01/16 1021   04/29/16 0930  ceFEPIme (MAXIPIME) 2 g in dextrose 5 % 50 mL IVPB  Status:  Discontinued     2 g 100 mL/hr over 30 Minutes Intravenous Every 12 hours 04/29/16 0859 05/01/16 1021   04/29/16 0930  vancomycin (VANCOCIN) IVPB 1000 mg/200 mL premix     1,000 mg 200 mL/hr over 60 Minutes Intravenous  Once 04/29/16 0859 04/29/16 1143      Subjective:   Jorge [name] seen and examined today. Patient denies chest pain, shortness of breath. Does have some dizziness occassionally. Denies abdominal pain, nausea or vomiting, diarrhea or constipation.  Objective:   Vitals:   05/01/16 0129 05/01/16 0222 05/01/16 0539 05/01/16 0631  BP: 105/65 110/68 (!) 94/50 (!) 95/52  Pulse: (!) 130 82 83 74  Resp: 18 16 18    Temp: 98.3 F (36.8 C)  97.7 F (36.5 C)   TempSrc: Oral  Oral   SpO2: 95% 98% 97%   Weight:   (!) 141.9 kg (312 lb 14.4 oz)   Height:        Intake/Output Summary (Last 24 hours) at 05/01/16 1059 Last data filed at 05/01/16 0900  Gross per 24 hour  Intake           570.25 ml  Output             1700 ml  Net         -1129.75 ml   Filed Weights   04/29/16 1245 04/30/16 0632 05/01/16 0539  Weight: (!) 146.8 kg (323 lb 11.2 oz) (!) 143.8 kg (317 lb) (!) 141.9 kg (312 lb 14.4 oz)    Exam  General: Well developed, well nourished, NAD, appears stated age  HEENT: NCAT,mucous membranes moist.   Neck: Supple, no JVD, no masses. Left carotid bruit  Cardiovascular: S1 S2  auscultated,3/6SEM-harsh, irregular  Respiratory: Clear to auscultation bilaterally with equal chest rise  Abdomen: Soft, obese, nontender, nondistended, + bowel sounds  Extremities: warm dry without cyanosis clubbing or edema. LUE in cast  Neuro: AAOx3, nonfocal  Psych: Normal affect and demeanor    Data Reviewed: I have personally reviewed following labs and imaging studies  CBC:  Recent Labs Lab 04/28/16 1249 04/29/16 0856 05/01/16 0437  WBC 14.2* 9.7 9.5  HGB 13.7 12.8* 13.4  HCT 42.8 40.1 41.6  MCV 94.5 95.0 94.5  PLT 306 309 290   Basic Metabolic Panel:  Recent Labs Lab 04/28/16 1209 04/29/16 1145 05/01/16 0437  NA 138 138 141  K 3.7 3.7 3.7  CL 103 107 104  CO2 26 24 26   GLUCOSE 132* 141* 107*  BUN 11 11 6   CREATININE 0.99 1.01 0.88  CALCIUM 8.9 8.2* 8.6*   GFR: Estimated Creatinine Clearance: 117.2 mL/min (by C-G formula based on SCr of 0.88 mg/dL). Liver Function Tests:  Recent Labs Lab 04/28/16 1209  AST 21  ALT 17  ALKPHOS 219*  BILITOT 0.5  PROT 7.3  ALBUMIN 3.8   No results  for input(s): LIPASE, AMYLASE in the last 168 hours. No results for input(s): AMMONIA in the last 168 hours. Coagulation Profile: No results for input(s): INR, PROTIME in the last 168 hours. Cardiac Enzymes:  Recent Labs Lab 04/28/16 1249  04/29/16 1145 04/29/16 1653 04/29/16 2325 04/30/16 0547 04/30/16 1104  CKTOTAL 148  --   --   --   --   --   --   TROPONINI  --   < > <0.03 <0.03 <0.03 <0.03 <0.03  < > = values in this interval not displayed. BNP (last 3 results) No results for input(s): PROBNP in the last 8760 hours. HbA1C: No results for input(s): HGBA1C in the last 72 hours. CBG:  Recent Labs Lab 04/28/16 1235 04/29/16 0616 05/01/16 0621  GLUCAP 136* 121* 95   Lipid Profile: No results for input(s): CHOL, HDL, LDLCALC, TRIG, CHOLHDL, LDLDIRECT in the last 72 hours. Thyroid Function Tests: No results for input(s): TSH, T4TOTAL, FREET4,  T3FREE, THYROIDAB in the last 72 hours. Anemia Panel: No results for input(s): VITAMINB12, FOLATE, FERRITIN, TIBC, IRON, RETICCTPCT in the last 72 hours. Urine analysis:    Component Value Date/Time   COLORURINE YELLOW 04/29/2016 1233   APPEARANCEUR CLEAR 04/29/2016 1233   LABSPEC 1.021 04/29/2016 1233   PHURINE 6.0 04/29/2016 1233   GLUCOSEU NEGATIVE 04/29/2016 1233   HGBUR SMALL (A) 04/29/2016 1233   BILIRUBINUR NEGATIVE 04/29/2016 1233   KETONESUR NEGATIVE 04/29/2016 1233   PROTEINUR NEGATIVE 04/29/2016 1233   NITRITE NEGATIVE 04/29/2016 1233   LEUKOCYTESUR NEGATIVE 04/29/2016 1233   Sepsis Labs: @LABRCNTIP (procalcitonin:4,lacticidven:4)  ) Recent Results (from the past 240 hour(s))  Culture, blood (routine x 2)     Status: None (Preliminary result)   Collection Time: 04/29/16  9:20 AM  Result Value Ref Range Status   Specimen Description BLOOD RIGHT ARM  Final   Special Requests IN PEDIATRIC BOTTLE  2CC  Final   Culture NO GROWTH 1 DAY  Final   Report Status PENDING  Incomplete  Culture, blood (routine x 2)     Status: None (Preliminary result)   Collection Time: 04/29/16  9:27 AM  Result Value Ref Range Status   Specimen Description BLOOD RIGHT HAND  Final   Special Requests IN PEDIATRIC BOTTLE  3 CC  Final   Culture NO GROWTH 1 DAY  Final   Report Status PENDING  Incomplete  Urine culture     Status: None   Collection Time: 04/29/16 12:33 PM  Result Value Ref Range Status   Specimen Description URINE, CLEAN CATCH  Final   Special Requests NONE  Final   Culture NO GROWTH  Final   Report Status 04/30/2016 FINAL  Final      Radiology Studies: No results found.   Scheduled Meds: . ALPRAZolam  0.5 mg Oral QHS  . atorvastatin  40 mg Oral q1800  . buPROPion  450 mg Oral Daily  . gabapentin  800 mg Oral TID  . mouth rinse  15 mL Mouth Rinse BID  . sertraline  150 mg Oral Daily  . sodium chloride flush  3 mL Intravenous Q12H  . sodium chloride flush  3 mL  Intravenous Q12H   Continuous Infusions: . diltiazem (CARDIZEM) infusion 5 mg/hr (05/01/16 0057)     LOS: 2 days   Time Spent in minutes   30 minutes  Jorge [name] D.O. on 05/01/2016 at 10:59 AM  Between 7am to 7pm - Pager - [phone]  After 7pm go to [email or web address] - password  [email or web address]  And look for the night coverage person covering for me after hours  [facility] Office  [phone]

## 2016-05-01 NOTE — Consult Note (Signed)
DENTAL CONSULTATION  Date of Consultation:  05/01/2016 Patient Name:   Jorge [name] Date of Birth:   [birth year] Medical Record Number: [id number]  VITALS: BP (!) 95/52   Pulse 74   Temp 97.7 F (36.5 C) (Oral)   Resp 18   Ht 6\' 2"  (1.88 m)   Wt (!) 312 lb 14.4 oz (141.9 kg) Comment: scale c  SpO2 97%   BMI 40.17 kg/m   CHIEF COMPLAINT: Patient was referred by Dr. Laneta Simmers for a dental consultation.  HPI: Jorge [name] is a male recently diagnosed with aortic stenosis. Patient with anticipated aortic valve replacement in the near future. Patient is now seen as part of a medically necessary pre-heart valve surgery dental protocol examination to rule out dental infection that may affect the patient's systemic health and anticipated heart valve surgery.  Patient currently denies acute toothaches, swellings, or abscesses. Patient has not seen a dentist for over 15-20 years. Last treatment was provided in [place], [place] for insertion of the crowns on tooth numbers 8 and 9. He denies having a primary dentist. Patient denies having dental phobia. Patient does not see a Dentist because he cannot afford to go to the dentist.  PROBLEM LIST: Patient Active Problem List   Diagnosis Date Noted  . Hypotension 04/29/2016  . NSTEMI (non-ST elevated myocardial infarction) (HCC)   . Syncope 04/28/2016  . Fall   . Ileus (HCC) 05/08/2015  . Abdominal pain 05/08/2015  . Nausea and vomiting 05/08/2015  . HTN (hypertension)   . Mixed hyperlipidemia   . CHF (congestive heart failure) (HCC)   . Cerebrovascular accident (CVA) due to stenosis of cerebral artery (HCC) 08/16/2014  . Paroxysmal atrial fibrillation (HCC) 05/29/2014  . Hemispheric carotid artery syndrome   . Internal carotid artery stenosis   . CAD (coronary artery disease) 05/25/2014  . History of tobacco abuse 05/25/2014  . TIA (transient ischemic attack) 05/25/2014  . HLD (hyperlipidemia)   . Essential hypertension    . Stroke-like symptoms 05/24/2014  . Dyspnea 03/22/2012  . Severe aortic stenosis 03/22/2012  . Acute edema of lung, unspecified 03/22/2012  . History of CEA (carotid endarterectomy) 03/22/2012  . Chest pain 03/22/2012    PMH: Past Medical History:  Diagnosis Date  . Atrial fibrillation (HCC)    pt on Eliquis  . Chest pain   . CHF (congestive heart failure) (HCC)   . Edema   . HTN (hypertension)   . Hyperlipidemia   . Neuropathy (HCC)   . Obesity   . SOB (shortness of breath)   . Stroke [facility])     PSH: Past Surgical History:  Procedure Laterality Date  . BACK SURGERY    . CARDIAC CATHETERIZATION    . CARDIAC CATHETERIZATION N/A 04/30/2016   Procedure: Right/Left Heart Cath and Coronary Angiography;  Surgeon: Kathleene Hazel, MD;  Location: [facility] INVASIVE CV LAB;  Service: Cardiovascular;  Laterality: N/A;  . CAROTID ANGIOGRAM N/A 05/30/2014   Procedure: Dorise Bullion;  Surgeon: Nada Libman, MD;  Location: [facility] CATH LAB;  Service: Cardiovascular;  Laterality: N/A;    ALLERGIES: Allergies  Allergen Reactions  . Coumadin [Warfarin Sodium] Rash    MEDICATIONS: Current Facility-Administered Medications  Medication Dose Route Frequency Provider Last Rate Last Dose  . 0.9 %  sodium chloride infusion  250 mL Intravenous PRN Kathleene Hazel, MD      . acetaminophen (TYLENOL) tablet 650 mg  650 mg Oral Q6H PRN Marcos Eke, PA-C  650 mg at 05/01/16 1610   Or  . acetaminophen (TYLENOL) suppository 650 mg  650 mg Rectal Q6H PRN Marcos Eke, PA-C      . ALPRAZolam Prudy Feeler) tablet 0.5 mg  0.5 mg Oral QHS Marcos Eke, PA-C   0.5 mg at 04/30/16 2257  . alum & mag hydroxide-simeth (MAALOX/MYLANTA) 200-200-20 MG/5ML suspension 30 mL  30 mL Oral Q6H PRN Marcos Eke, PA-C      . atorvastatin (LIPITOR) tablet 40 mg  40 mg Oral q1800 Marcos Eke, PA-C   40 mg at 04/29/16 1744  . bisacodyl (DULCOLAX) EC tablet 5 mg  5 mg Oral Daily PRN Maryann Mikhail, DO       . buPROPion [facility] SR) 12 hr tablet 450 mg  450 mg Oral Daily Marcos Eke, PA-C   450 mg at 05/01/16 1004  . diltiazem (CARDIZEM) 100 mg in dextrose 5 % 100 mL (1 mg/mL) infusion  5-15 mg/hr Intravenous Titrated Rollene Rotunda, MD 5 mL/hr at 05/01/16 0057 5 mg/hr at 05/01/16 0057  . gabapentin (NEURONTIN) capsule 800 mg  800 mg Oral TID Marcos Eke, PA-C   800 mg at 05/01/16 1004  . heparin ADULT infusion 100 units/mL (25000 units/272mL sodium chloride 0.45%)  2,000 Units/hr Intravenous Continuous Marquita Palms, RPH 20 mL/hr at 05/01/16 1115 2,000 Units/hr at 05/01/16 1115  . MEDLINE mouth rinse  15 mL Mouth Rinse BID Maryann Mikhail, DO   15 mL at 05/01/16 1000  . ondansetron (ZOFRAN) tablet 4 mg  4 mg Oral Q6H PRN Marcos Eke, PA-C   4 mg at 04/30/16 1131   Or  . ondansetron (ZOFRAN) injection 4 mg  4 mg Intravenous Q6H PRN Marcos Eke, PA-C   4 mg at 04/28/16 2236  . senna-docusate (Senokot-S) tablet 1 tablet  1 tablet Oral QHS PRN Marcos Eke, PA-C      . sertraline (ZOLOFT) tablet 150 mg  150 mg Oral Daily Marcos Eke, PA-C   150 mg at 05/01/16 1004  . sodium chloride flush (NS) 0.9 % injection 3 mL  3 mL Intravenous Q12H Marcos Eke, PA-C   3 mL at 05/01/16 1000  . sodium chloride flush (NS) 0.9 % injection 3 mL  3 mL Intravenous Q12H Kathleene Hazel, MD   3 mL at 05/01/16 1000  . sodium chloride flush (NS) 0.9 % injection 3 mL  3 mL Intravenous PRN Kathleene Hazel, MD        LABS: Lab Results  Component Value Date   WBC 9.5 05/01/2016   HGB 13.4 05/01/2016   HCT 41.6 05/01/2016   MCV 94.5 05/01/2016   PLT 290 05/01/2016      Component Value Date/Time   NA 141 05/01/2016 0437   K 3.7 05/01/2016 0437   CL 104 05/01/2016 0437   CO2 26 05/01/2016 0437   GLUCOSE 107 (H) 05/01/2016 0437   BUN 6 05/01/2016 0437   CREATININE 0.88 05/01/2016 0437   CALCIUM 8.6 (L) 05/01/2016 0437   GFRNONAA >60 05/01/2016 0437   GFRAA >60 05/01/2016 0437    Lab Results  Component Value Date   INR 1.16 05/17/2015   INR 1.04 05/24/2014   No results found for: PTT  SOCIAL HISTORY: Social History   Social History  . Marital status: Divorced    Spouse name: N/A  . Number of children: N/A  . Years of education: N/A   Occupational History  . Not  on file.   Social History Main Topics  . Smoking status: Former Games developermoker  . Smokeless tobacco: Former NeurosurgeonUser    Quit date: 03/22/1996  . Alcohol use No  . Drug use: No  . Sexual activity: Not on file   Other Topics Concern  . Not on file   Social History Narrative  . No narrative on file    FAMILY HISTORY: Family History  Problem Relation Age of Onset  . Heart attack Mother   . Stroke Father     REVIEW OF SYSTEMS: Reviewed With the patient has a history of present illness.  Psych: The patient denies having dental phobia.   DENTAL HISTORY: CHIEF COMPLAINT: Patient was referred by Dr. Laneta SimmersBartle for a dental consultation.  HPI: Marygrace DroughtGeorge O Oppedisano is a male recently diagnosed with aortic stenosis. Patient with anticipated aortic valve replacement in the near future. Patient is now seen as part of a medically necessary pre-heart valve surgery dental protocol examination to rule out dental infection that may affect the patient's systemic health and anticipated heart valve surgery.  Patient currently denies acute toothaches, swellings, or abscesses. Patient has not seen a dentist for over 15-20 years. [place] of the crowns on tooth numbers 8 and 9. He denies having a primary dentist. Patient denies having dental phobia. Patient does not see a Dentist because he cannot afford to go to the dentist.  DENTAL EXAMINATION: GENERAL: Patient is a well-developed, obese male lying in hospital bed. HEAD AND NECK: There is no palpable submandibular lymphadenopathy. The patient denies acute TMJ symptoms. INTRAORAL EXAM: Patient has normal  saliva. There is no evidence of oral abscess formation. DENTITION: Patient with multiple missing teeth numbers 1, 2, pain, 17, 19, 30, and 32.  Patient has retained root segments in the area of tooth numbers 7 and 10. PERIODONTAL: Patient has chronic periodontitis with plaque and calculus accumulations, generalized gingival recession, and mobility.  Tooth numbers 23, 24, 25, 26 have significant mobility to recommend extraction at this time. DENTAL CARIES/SUBOPTIMAL RESTORATIONS: Patient had dental caries involving tooth numbers 7 and 10. I would need a full series of dental radiographs to rule out other incipient dental caries. ENDODONTIC: Patient currently denies acute pulpitis symptoms. I do not see any periapical pathology associated with tooth numbers 20 and 29 and these appear to represent superimposed mental nerve foramina. Patient has had previous root canal therapy associated with tooth numbers 9 and 10. Root canal therapy #10 is now exposed to the oral environment. CROWN AND BRIDGE:  Patient has crowns on tooth numbers 8 and 9. PROSTHODONTIC: No history of partial dentures. OCCLUSION: Poor occlusal scheme secondary to multiple missing teeth, supra-eruption and drifting of the unopposed teeth into the edentulous areas, and lack of replacement of missing teeth with dental prostheses, as well as a deep overbite.  RADIOGRAPHIC INTERPRETATION: An orthopantogram was taken today 05/01/2016. This is Suboptimal. There are multiple missing teeth. There multiple retained root segments. There is supra-eruption and drifting of the unopposed teeth into the edentulous areas. There is moderate bone loss noted. There are crowns of tooth numbers 8 and 9.  There are previous root canal therapies associated with tooth numbers 9 and 10.  ASSESSMENTS: 1. Severe aortic stenosis 2. Pre-heart valve surgery dental protocol 3. Multiple retained root segments 4. Dental caries 5. Chronic periodontitis of bone loss 6.  Gingival recession 7. Tooth mobility 8. Accretions 10. Missing teeth 11. Deep Overbite 12. Supra-eruption and  drifting of the unopposed teeth into the edentulous areas 13. No history of partial dentures 14. Risk For bleeding with invasive dental procedures 15. Risk for complications up to and including death with anticipated invasive dental procedure general anesthesia due to overall respiratory and cardiovascular compromise.  PLAN/RECOMMENDATIONS: 1. I discussed the risks, benefits, and complications of various treatment options with the patient in relationship to his medical and dental conditions, anticipated heart valve surgery, and risk for endocarditis. We discussed various treatment options to include no treatment, multiple extractions with alveoloplasty, pre-prosthetic surgery as indicated, periodontal therapy, dental restorations, root canal therapy, crown and bridge therapy, implant therapy, and replacement of missing teeth as indicated. The patient currently wishes to proceed with extraction of tooth numbers 7, 10, 23, 24, 25, 26 and others as indicated after a more thorough examination in the operating room. This will be completed with alveoloplasty and gross debridement of remaining dentition as patient condition allows. The patient will then proceed his heart valve surgery as indicated after adequate healing.  Heparin therapy will be discontinued to 6 hours prior to invasive dental procedures and held until approximately 12 hours after the surgical procedure and then restarted with no blows.   2. Discussion of findings with medical team and coordination of future medical and dental care as needed.     Charlynne Pander, DDS

## 2016-05-01 NOTE — Progress Notes (Addendum)
Patient converted back from NSR to AF. EKG will be done.  Will continue to monitor.  Chevella Pearce, RN

## 2016-05-01 NOTE — Progress Notes (Signed)
ANTICOAGULATION CONSULT NOTE - f/u Consult  Pharmacy Consult for Heparin Indication: chest pain/ACS  Allergies  Allergen Reactions  . Coumadin [Warfarin Sodium] Rash    Patient Measurements: Height: 6\' 2"  (188 cm) Weight: (!) 312 lb 14.4 oz (141.9 kg) (scale c) IBW/kg (Calculated) : 82.2 Heparin Dosing Weight: 115.4 kg  Vital Signs: Temp: 98.3 F (36.8 C) (01/25 2107) Temp Source: Oral (01/25 2107) BP: 99/60 (01/25 1554) Pulse Rate: 66 (01/25 2107)  Labs:  Recent Labs  04/29/16 0856  04/29/16 1145  04/29/16 2325 04/30/16 0547 04/30/16 0812 04/30/16 1104 05/01/16 0437 05/01/16 2014  HGB 12.8*  --   --   --   --   --   --   --  13.4  --   HCT 40.1  --   --   --   --   --   --   --  41.6  --   PLT 309  --   --   --   --   --   --   --  290  --   APTT  --   --  55*  < >  --  >200* 45*  --   --  63*  HEPARINUNFRC  --   < > 0.73*  --   --  1.58* 0.51  --   --  0.60  CREATININE  --   --  1.01  --   --   --   --   --  0.88  --   TROPONINI  --   --  <0.03  < > <0.03 <0.03  --  <0.03  --   --   < > = values in this interval not displayed.  Estimated Creatinine Clearance: 117.2 mL/min (by C-G formula based on SCr of 0.88 mg/dL).   Medical History: Past Medical History:  Diagnosis Date  . Atrial fibrillation (HCC)    pt on Eliquis  . Chest pain   . CHF (congestive heart failure) (HCC)   . Edema   . HTN (hypertension)   . Hyperlipidemia   . Neuropathy (HCC)   . Obesity   . SOB (shortness of breath)   . Stroke [facility](HCC)     Medications:  Prescriptions Prior to Admission  Medication Sig Dispense Refill Last Dose  . ALPRAZolam (XANAX) 0.5 MG tablet Take 0.5 mg by mouth at bedtime.    04/27/2016 at Unknown time  . apixaban (ELIQUIS) 5 MG TABS tablet Take 1 tablet (5 mg total) by mouth 2 (two) times daily. 60 tablet 0 04/27/2016 at 2200  . atorvastatin (LIPITOR) 40 MG tablet Take 1 tablet (40 mg total) by mouth daily at 6 PM. 30 tablet 0 04/27/2016 at Unknown time  .  buPROPion (WELLBUTRIN SR) 150 MG 12 hr tablet Take 450 mg by mouth daily.    04/27/2016 at Unknown time  . furosemide (LASIX) 40 MG tablet Take 40 mg by mouth daily.   04/27/2016 at Unknown time  . gabapentin (NEURONTIN) 800 MG tablet Take 800 mg by mouth 3 (three) times daily.    04/27/2016 at Unknown time  . hydrOXYzine (ATARAX/VISTARIL) 25 MG tablet Take 25 mg by mouth 3 (three) times daily as needed.   Past Month at Unknown time  . metoprolol tartrate (LOPRESSOR) 25 MG tablet Take 0.5 tablets (12.5 mg total) by mouth 2 (two) times daily. (Patient taking differently: Take 25 mg by mouth 2 (two) times daily. ) 30 tablet 0 04/27/2016 at 2200  .  oxycodone (OXY-IR) 5 MG capsule Take 5 mg by mouth every 4 (four) hours as needed for pain.    04/27/2016 at Unknown time  . sertraline (ZOLOFT) 100 MG tablet Take 150 mg by mouth daily.    04/27/2016 at Unknown time  . traMADol (ULTRAM) 50 MG tablet Take 50 mg by mouth every 6 (six) hours as needed for moderate pain.    Past Month at Unknown time   Scheduled:  . ALPRAZolam  0.5 mg Oral QHS  . atorvastatin  40 mg Oral q1800  . buPROPion  450 mg Oral Daily  . [START ON 05/02/2016]  ceFAZolin (ANCEF) IV  3 g Intravenous Once  . gabapentin  800 mg Oral TID  . mouth rinse  15 mL Mouth Rinse BID  . sertraline  150 mg Oral Daily  . sodium chloride flush  3 mL Intravenous Q12H  . sodium chloride flush  3 mL Intravenous Q12H   Infusions:  . diltiazem (CARDIZEM) infusion 5 mg/hr (05/01/16 0057)  . heparin      Assessment: 71 yo male with history of Afib on eliquis, HTN, HLD, OSA and CVA presents with syncope. Pharmacy is consulted to dose heparin for ACS/chest pain. Vascular surgery consulted and did not recommend further acute intervention at this time.  Heparin resumed yesterday after cath was completed at prior dose of 2000 units/hour. Heparin level now therapeutic x 2 (0.51 >> 0.6).   Goal of Therapy:  Heparin level 0.3-0.7 units/ml aPTT 66-102  seconds Monitor platelets by anticoagulation protocol: Yes   Plan:  Continue heparin infusion 2000 units/hr Check heparin level and CBC daily  Dannial Monarch, PharmD Candidate 05/01/2016,9:14 PM

## 2016-05-01 NOTE — Progress Notes (Signed)
Physical Therapy Treatment Patient Details Name: Marygrace DroughtGeorge O Schlee MRN: [id number] DOB: 1945/09/20 Today's Date: 05/01/2016    History of Present Illness Marygrace DroughtGeorge O Inga is a 71 y.o. male  who presents with syncope. Cardiac vs neck/intracranial vascular vs PE. Pt w/ h/o falls including fall ~1 month ago resulting in LUE fracture - pt with cast and then a fall at home after "blacking out" & bumping his head in bathroom after shaving on day of admit.    PT Comments    Pt very limited with mobility due to lightheadedness. Pt only tolerating sitting EOB a few minutes before returning to supine due to lightheadedness. Attempted to take orthostatic vitals but dynamap unable to get reading in sitting. Supine BP 86/59. Feel pt will be able to progress if orthostasis improves.  Follow Up Recommendations  SNF     Equipment Recommendations  None recommended by PT    Recommendations for Other Services       Precautions / Restrictions Precautions Precautions: Fall Precaution Comments: syncope Restrictions Weight Bearing Restrictions: No    Mobility  Bed Mobility Overal bed mobility: Needs Assistance Bed Mobility: Sit to Supine;Supine to Sit     Supine to sit: Mod assist Sit to supine: Min guard   General bed mobility comments: Assist to elevate trunk into sitting. Assist to guide shoulders back down into supine.  Transfers                 General transfer comment: Unable due to lightheaded sitting EOB  Ambulation/Gait             General Gait Details: Unable due to lightheaded sitting EOB   Stairs            Wheelchair Mobility    Modified Rankin (Stroke Patients Only)       Balance Overall balance assessment: Needs assistance;History of Falls (Pt becomes very light headed with sitting EOB. ) Sitting-balance support: Feet supported;Single extremity supported Sitting balance-Leahy Scale: Poor Sitting balance - Comments: Pt sat EOB x 90 sec on first  attempt and 3-4 minutes on 2nd attempt. Pt limited by lightheadedness. Supervision only for safety due to lightheadedness and hx of syncope. Otherwise pt with stable sitting balance.                            Cognition Arousal/Alertness: Awake/alert Behavior During Therapy: WFL for tasks assessed/performed Overall Cognitive Status: Within Functional Limits for tasks assessed                      Exercises      General Comments        Pertinent Vitals/Pain Pain Assessment: No/denies pain    Home Living                      Prior Function            PT Goals (current goals can now be found in the care plan section) Progress towards PT goals: Not progressing toward goals - comment (continued light headedness)    Frequency    Min 3X/week      PT Plan Current plan remains appropriate    Co-evaluation             End of Session Equipment Utilized During Treatment: Oxygen Activity Tolerance: Treatment limited secondary to medical complications (Comment) (lightheaded) Patient left: in bed;with call bell/phone within reach  Time: 0922-1005 (Out of room x 10 minutes while MD speaking to pt`) PT Time Calculation (min) (ACUTE ONLY): 43 min  Charges:  $Therapeutic Activity: 8-22 mins                    G CodesAngelina Ok [facility] 05-08-16, 10:18 AM Skip Mayer PT [phone]

## 2016-05-01 NOTE — Consult Note (Addendum)
[facility]  MULTIDISCIPLINARY HEART VALVE CLINIC  CARDIOTHORACIC SURGERY CONSULTATION REPORT  Referring Provider is Edsel Petrin, D.[name]. PCP is [name], PA-C  Chief Complaint  Patient presents with  . 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 Darrick Penna, atrial fibrillation on Eliquis, peripheral neuropathy, moderate aortic stenosis and congestive heart failure. In February 2016 the patient presented with left-sided weakness and loss of consciousness. His MRI was negative for acute stroke. He underwent a cerebral angiogram that revealed less than 50% right internal carotid artery stenosis and luminal irregularity at the distal left carotid artery patch. There was no significant vertebral artery stenosis. He has some weakness in the right arm and leg from his prior stroke but is able to ambulate with a cane and does take his little dog for walks. Two or three months ago he was walking his dog and his legs gave out and he fell and broke his left arm which has been in a cast since. He doesn't remember the details around that time. He has had some episodes of dizziness since and was admitted with a syncopal episode. He was shaving and noted some visual changes and lost consciousness falling in the bathtub and hitting his head. He woke up but could not get help until meals on wheels arrived for lunch and EMS was called. His head CTA was negative for intracranial large vessel occlusion or high-grade stenosis. There was mild distal small vessel disease. There was atheromatous plaque in the carotid siphons without significant stenosis. 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 There  was 70-80% short-segment stenosis of the proximal right subclavian artery. 2D echo showed a trileaflet aortic valve with moderately calcified leaflets and severe stenosis with a mean gradient of 41 mm Hg and a peak of 70 mm Hg. LV function is normal with moderate LVH. Prior echo in 01/2016 showed a mean aortic valve gradient of 28 mm Hg. Cardiac cath yesterday showed 40% ostial to proximal LCX stenosis with a mean AV gradient of 34 mm Hg.  He reports having exertional shortness of breath that has progressed over the past 6 months. He has chronic lower extremity edema that is improved with lasix. He has had some chest discomfort at times. He denies orthopnea and PND.  He currently lives in an independent living unit. He is divorced and has 3 sons who he keeps in touch with and multiple grandchildren.     Past Medical History:  Diagnosis Date  . Atrial fibrillation (HCC)    pt on Eliquis  . Chest pain   . CHF (congestive heart failure) (HCC)   . Edema   . HTN (hypertension)   . Hyperlipidemia   . Neuropathy (HCC)   . Obesity   . SOB (shortness of breath)   . Stroke [facility])     Past Surgical History:  Procedure Laterality Date  . BACK SURGERY    . CARDIAC CATHETERIZATION    . CARDIAC CATHETERIZATION N/A 04/30/2016   Procedure: Right/Left Heart Cath and Coronary Angiography;  Surgeon: Kathleene Hazel, MD;  Location: [facility] INVASIVE CV LAB;  Service: Cardiovascular;  Laterality: N/A;  . CAROTID ANGIOGRAM N/A 05/30/2014   Procedure:  CERBRAL  ANGIOGRAM;  Surgeon: Nada Libman, MD;  Location: [facility] CATH LAB;  Service: Cardiovascular;  Laterality: N/A;    Family History  Problem Relation Age of Onset  . Heart attack Mother   . Stroke Father     Social History   Social History  . Marital status: Divorced    Spouse name: N/A  . Number of children: N/A  . Years of education: N/A   Occupational History  . Not on file.   Social History Main Topics  . Smoking status: Former Games developer    . Smokeless tobacco: Former Neurosurgeon    Quit date: 03/22/1996  . Alcohol use No  . Drug use: No  . Sexual activity: Not on file   Other Topics Concern  . Not on file   Social History Narrative  . No narrative on file    Current Facility-Administered Medications  Medication Dose Route Frequency Provider Last Rate Last Dose  . 0.9 %  sodium chloride infusion  250 mL Intravenous PRN Kathleene Hazel, MD      . acetaminophen (TYLENOL) tablet 650 mg  650 mg Oral Q6H PRN Marcos Eke, PA-C   650 mg at 05/01/16 1610   Or  . acetaminophen (TYLENOL) suppository 650 mg  650 mg Rectal Q6H PRN Marcos Eke, PA-C      . ALPRAZolam Prudy Feeler) tablet 0.5 mg  0.5 mg Oral QHS Marcos Eke, PA-C   0.5 mg at 04/30/16 2257  . alum & mag hydroxide-simeth (MAALOX/MYLANTA) 200-200-20 MG/5ML suspension 30 mL  30 mL Oral Q6H PRN Marcos Eke, PA-C      . atorvastatin (LIPITOR) tablet 40 mg  40 mg Oral q1800 Marcos Eke, PA-C   40 mg at 04/29/16 1744  . bisacodyl (DULCOLAX) EC tablet 5 mg  5 mg Oral Daily PRN Maryann Mikhail, DO      . buPROPion [facility] SR) 12 hr tablet 450 mg  450 mg Oral Daily Marcos Eke, PA-C   450 mg at 05/01/16 1004  . ceFEPIme (MAXIPIME) 2 g in dextrose 5 % 50 mL IVPB  2 g Intravenous Q12H Marquita Palms, RPH   2 g at 05/01/16 1004  . diltiazem (CARDIZEM) 100 mg in dextrose 5 % 100 mL (1 mg/mL) infusion  5-15 mg/hr Intravenous Titrated Rollene Rotunda, MD 5 mL/hr at 05/01/16 0057 5 mg/hr at 05/01/16 0057  . gabapentin (NEURONTIN) capsule 800 mg  800 mg Oral TID Marcos Eke, PA-C   800 mg at 05/01/16 1004  . MEDLINE mouth rinse  15 mL Mouth Rinse BID Maryann Mikhail, DO   15 mL at 05/01/16 1000  . ondansetron (ZOFRAN) tablet 4 mg  4 mg Oral Q6H PRN Marcos Eke, PA-C   4 mg at 04/30/16 1131   Or  . ondansetron (ZOFRAN) injection 4 mg  4 mg Intravenous Q6H PRN Marcos Eke, PA-C   4 mg at 04/28/16 2236  . senna-docusate (Senokot-S) tablet 1 tablet  1 tablet Oral QHS  PRN Marcos Eke, PA-C      . sertraline (ZOLOFT) tablet 150 mg  150 mg Oral Daily Marcos Eke, PA-C   150 mg at 05/01/16 1004  . sodium chloride flush (NS) 0.9 % injection 3 mL  3 mL Intravenous Q12H Marcos Eke, PA-C   3 mL at 05/01/16 1000  . sodium chloride flush (NS) 0.9 % injection 3 mL  3 mL Intravenous Q12H Kathleene Hazel, MD  3 mL at 05/01/16 1000  . sodium chloride flush (NS) 0.9 % injection 3 mL  3 mL Intravenous PRN Kathleene Hazel, MD      . vancomycin (VANCOCIN) IVPB 750 mg/150 ml premix  750 mg Intravenous Q12H Marquita Palms, RPH   750 mg at 04/30/16 2335    Allergies  Allergen Reactions  . Coumadin [Warfarin Sodium] Rash      Review of Systems:   General:  normal appetite, decreased energy, some weight gain, no weight loss, no fever  Cardiac:  some chest pain with exertion, no chest pain at rest, has SOB with mild exertion, no resting SOB, no PND, no orthopnea, no palpitations, has arrhythmia, has atrial fibrillation, chronic LE edema, frequent dizzy spells, few episodes of syncope  Respiratory:  exertional shortness of breath, no home oxygen, no productive cough, no dry cough, no bronchitis, no wheezing, no hemoptysis, no asthma, no pain with inspiration or cough, no sleep apnea, no CPAP at night  GI:   no difficulty swallowing, no reflux, no frequent heartburn, no hiatal hernia, no abdominal pain, no constipation, no diarrhea, no hematochezia, no hematemesis, no melena  GU:   no dysuria,  no frequency, no urinary tract infection, no hematuria, no enlarged prostate, no kidney stones, no kidney disease  Vascular:  no pain suggestive of claudication, has pain in feet, no leg cramps, no varicose veins, no DVT, no non-healing foot ulcer  Neuro:   Prior left brain stroke, no TIA's, no seizures, no headaches, no temporary blindness one eye,  no slurred speech, has peripheral neuropathy, no chronic pain, some instability of gait, no memory/cognitive  dysfunction  Musculoskeletal: no arthritis, no joint swelling, no myalgias, has difficulty walking, reduced mobility   Skin:   no rash, no itching, no skin infections, no pressure sores or ulcerations  Psych:   no anxiety, some depression, no nervousness, no unusual recent stress  Eyes:   no blurry vision, no floaters, no recent vision changes,  wears glasses or contacts  ENT:   no hearing loss, some loose or painful teeth, no dentures, last saw dentist many years ago  Hematologic:  no easy bruising, no abnormal bleeding, no clotting disorder, no frequent epistaxis  Endocrine:  denies diabetes, does not check CBG's at home           Physical Exam:   BP (!) 95/52   Pulse 74   Temp 97.7 F (36.5 C) (Oral)   Resp 18   Ht 6\' 2"  (1.88 m)   Wt (!) 141.9 kg (312 lb 14.4 oz) Comment: scale c  SpO2 97%   BMI 40.17 kg/m   General:  Morbidly obese, chronically ill-appearing but in no distress  HEENT:  Unremarkable , NCAT, PERLA, EOMI, oropharynx clear, teeth in place but not in optimal condition.  Neck:   no JVD, no bruits, no adenopathy or thyromegaly  Chest:   clear to auscultation, symmetrical breath sounds, no wheezes, no rhonchi   CV:   RRR, grade III/VI crescendo/decrescendo murmur heard best at RSB,  no diastolic murmur  Abdomen:  soft, obese, non-tender  Extremities:  warm, well-perfused, pulses palpable in feet, no LE edema, chronic brownish discoloration of lower legs.  Rectal/GU  Deferred  Neuro:   Grossly non-focal.  Skin:   Clean and dry, no rashes, no breakdown   Diagnostic Tests:  Jorge [name]  ECHO COMPLETE W IMAGE ENHANCING AGENT  Order# [id number]  Reading physician: Lewayne Bunting, MD Ordering physician: Jon Billings  Bonita QuinA Regalado, MD Study date: 04/29/16  Study Result   Result status: Final result                              *[place]*                   *[facility]*                         [address]                         [place], [place] [address]                            [phone]  ------------------------------------------------------------------- Transthoracic Echocardiography  Patient:    Jorge DroughtWhatley, Jorge [name] MR #:       [id number] Study Date: 04/29/2016 Gender:     M Age:        70 Height:     188 cm Weight:     145 kg BSA:        2.81 m^2 Pt. Status: Room:       3E20C   ATTENDING    Regalado, Belkys A  ORDERING     Regalado, Belkys A  REFERRING    Regalado, Belkys A  ADMITTING    Ozella RocksMerrell, David J  PERFORMING   [facility], Inpatient  SONOGRAPHER  Lysbeth GalasAlexis Pitts, RDCS  cc:  ------------------------------------------------------------------- LV EF: 55% -   60%  ------------------------------------------------------------------- History:   PMH:  Elevated Troponin.  Chest pain.  Dyspnea. Congestive heart failure.  Stroke.  ------------------------------------------------------------------- Study Conclusions  - Left ventricle: The cavity size was normal. Wall thickness was   increased in a pattern of moderate LVH. Systolic function was   normal. The estimated ejection fraction was in the range of 55%   to 60%. Wall motion was normal; there were no regional wall   motion abnormalities. Doppler parameters are consistent with   abnormal left ventricular relaxation (grade 1 diastolic   dysfunction). Doppler parameters are consistent with high   ventricular filling pressure. - Aortic valve: There was severe stenosis. There was trivial   regurgitation. - Left atrium: The atrium was moderately dilated. - Pulmonary arteries: Systolic pressure was mildly increased. PA   peak pressure: 42 mm Hg (S).  Impressions:  - Technically difficult; definity used; normal LV systolic   function; grade 1 diastolic dysfunction with elevated LV filling   pressure; moderate LVH; calcified aortic valve with severe AS;   moderate LAE; mildly elevated pulmonary  pressure.  ------------------------------------------------------------------- Study data:  Comparison was made to the study of 02/04/2016.  Study status:  Routine.  Procedure:  The patient reported no pain pre or post test. Transthoracic echocardiography. Image quality was poor. The study was technically difficult, as a result of poor acoustic windows, poor patient compliance, restricted patient mobility, and body habitus. Intravenous contrast (Definity) was administered. Study completion:  There were no complications. Transthoracic echocardiography.  M-mode, complete 2D, spectral Doppler, and color Doppler.  Birthdate:  Patient birthdate: 08-Apr-1945.  Age:  Patient is 71 yr old.  Sex:  Gender: male. BMI: 41 kg/m^2.  Blood pressure:     95/55  Patient status: Inpatient.  Study date:  Study date: 04/29/2016. Study time: 12:52 PM.  Location:  Bedside.  -------------------------------------------------------------------  ------------------------------------------------------------------- Left  ventricle:  The cavity size was normal. Wall thickness was increased in a pattern of moderate LVH. Systolic function was normal. The estimated ejection fraction was in the range of 55% to 60%. Wall motion was normal; there were no regional wall motion abnormalities. Doppler parameters are consistent with abnormal left ventricular relaxation (grade 1 diastolic dysfunction). Doppler parameters are consistent with high ventricular filling pressure.   ------------------------------------------------------------------- Aortic valve:   Trileaflet; moderately calcified leaflets. Doppler:   There was severe stenosis.   There was trivial regurgitation.    VTI ratio of LVOT to aortic valve: 0.39. Indexed valve area (VTI): 0.48 cm^2/m^2. Peak velocity ratio of LVOT to aortic valve: 0.34. Indexed valve area (Vmax): 0.42 cm^2/m^2. Mean velocity ratio of LVOT to aortic valve: 0.37. Indexed valve  area (Vmean): 0.46 cm^2/m^2.    Mean gradient (S): 41 mm Hg. Peak gradient (S): 70 mm Hg.  ------------------------------------------------------------------- Aorta:  Aortic root: The aortic root was normal in size.  ------------------------------------------------------------------- Mitral valve:   Structurally normal valve.   Mobility was not restricted.  Doppler:  Transvalvular velocity was within the normal range. There was no evidence for stenosis. There was trivial regurgitation.    Peak gradient (D): 3 mm Hg.  ------------------------------------------------------------------- Left atrium:  The atrium was moderately dilated.  ------------------------------------------------------------------- Right ventricle:  The cavity size was normal. Systolic function was normal.  ------------------------------------------------------------------- Pulmonic valve:    Doppler:  Transvalvular velocity was within the normal range. There was no evidence for stenosis.  ------------------------------------------------------------------- Tricuspid valve:   Structurally normal valve.    Doppler: Transvalvular velocity was within the normal range. There was trivial regurgitation.  ------------------------------------------------------------------- Pulmonary artery:   Systolic pressure was mildly increased.  ------------------------------------------------------------------- Right atrium:  The atrium was normal in size.  ------------------------------------------------------------------- Pericardium:  There was no pericardial effusion.  ------------------------------------------------------------------- Measurements   Left ventricle                           Value          Reference  LV ID, ED, PLAX chordal          (L)     41.7  mm       43 - 52  LV ID, ES, PLAX chordal                  25.5  mm       23 - 38  LV fx shortening, PLAX chordal           39    %        >=29  LV PW  thickness, ED                      13.4  mm       ----------  IVS/LV PW ratio, ED                      1.11           <=1.3  Stroke volume, 2D                        126   ml       ----------  Stroke volume/bsa, 2D                    45    ml/m^2   ----------  LV e&', lateral  7.4   cm/s     ----------  LV E/e&', lateral                         11.72          ----------  LV e&', medial                            4.9   cm/s     ----------  LV E/e&', medial                          17.69          ----------  LV e&', average                           6.15  cm/s     ----------  LV E/e&', average                         14.1           ----------    Ventricular septum                       Value          Reference  IVS thickness, ED                        14.9  mm       ----------    LVOT                                     Value          Reference  LVOT ID, S                               21    mm       ----------  LVOT area                                3.46  cm^2     ----------  LVOT peak velocity, S                    144   cm/s     ----------  LVOT mean velocity, S                    104   cm/s     ----------  LVOT VTI, S                              36.3  cm       ----------  LVOT peak gradient, S                    8     mm Hg    ----------    Aortic valve                             Value  Reference  Aortic valve peak velocity, S            418   cm/s     ----------  Aortic valve mean velocity, S            279   cm/s     ----------  Aortic valve VTI, S                      93.5  cm       ----------  Aortic mean gradient, S                  41    mm Hg    ----------  Aortic peak gradient, S                  70    mm Hg    ----------  VTI ratio, LVOT/AV                       0.39           ----------  Aortic valve area/bsa, VTI               0.48  cm^2/m^2 ----------  Velocity ratio, peak, LVOT/AV            0.34           ----------  Aortic valve  area/bsa, peak              0.42  cm^2/m^2 ----------  velocity  Velocity ratio, mean, LVOT/AV            0.37           ----------  Aortic valve area/bsa, mean              0.46  cm^2/m^2 ----------  velocity  Aortic regurg pressure half-time         421   ms       ----------    Aorta                                    Value          Reference  Aortic root ID, ED                       34    mm       ----------    Left atrium                              Value          Reference  LA ID, A-P, ES                           36    mm       ----------  LA ID/bsa, A-P                           1.28  cm/m^2   <=2.2  LA volume, S                             102   ml       ----------  LA volume/bsa, S                         36.3  ml/m^2   ----------  LA volume, ES, 1-p A4C                   77.4  ml       ----------  LA volume/bsa, ES, 1-p A4C               27.5  ml/m^2   ----------  LA volume, ES, 1-p A2C                   120   ml       ----------  LA volume/bsa, ES, 1-p A2C               42.7  ml/m^2   ----------    Mitral valve                             Value          Reference  Mitral E-wave peak velocity              86.7  cm/s     ----------  Mitral A-wave peak velocity              118   cm/s     ----------  Mitral deceleration time         (H)     366   ms       150 - 230  Mitral peak gradient, D                  3     mm Hg    ----------  Mitral E/A ratio, peak                   0.7            ----------    Pulmonary arteries                       Value          Reference  PA pressure, S, DP               (H)     42    mm Hg    <=30    Tricuspid valve                          Value          Reference  Tricuspid regurg peak velocity           312   cm/s     ----------  Tricuspid peak RV-RA gradient            39    mm Hg    ----------    Right atrium                             Value          Reference  RA ID, S-I, ES, A4C              (H)     53.8  mm       34 - 49  RA area,  ES, A4C  19.4  cm^2     8.3 - 19.5  RA volume, ES, A/L                       58.7  ml       ----------  RA volume/bsa, ES, A/L                   20.9  ml/m^2   ----------    Systemic veins                           Value          Reference  Estimated CVP                            3     mm Hg    ----------    Right ventricle                          Value          Reference  TAPSE                                    34.2  mm       ----------  RV pressure, S, DP               (H)     42    mm Hg    <=30  RV s&', lateral, S                        18.6  cm/s     ----------  Legend: (L)  and  (H)  mark values outside specified reference range.  ------------------------------------------------------------------- Prepared and Electronically Authenticated by  Olga Millers 2018-01-23T15:27:59    Jorge Drought  Cardiac catheterization  Order# [id number]  Reading physician: Kathleene Hazel, MD Ordering physician: Kathleene Hazel, MD Study date: 04/30/16  Physicians   Panel Physicians Referring Physician Case Authorizing Physician  Kathleene Hazel, MD (Primary)    Procedures   Right/Left Heart Cath and Coronary Angiography  Conclusion     Ost Cx to Prox Cx lesion, 40 %stenosed.  There is severe aortic valve stenosis.  Hemodynamic findings consistent with aortic valve stenosis.   1. Mild non-obstructive CAD 2. Severe aortic stenosis (mean gradient 33.96mmHg, AVA 1.02 cm2).   Recommendations: It is likely that his syncope is due to his aortic stenosis. I will ask the primary team to call CT surgery in the am. He may not be a good candidate for surgical AVR. Dr. Cornelius Moras or Dr. Laneta Simmers should be asked to see him so they can discuss AVR vs TAVR. If he is not felt to be a surgical candidate, I will be glad to see him in consultation as well to discuss TAVR.    Indications   Severe aortic stenosis [I35.0 (ICD-10-CM)]  Procedural  Details/Technique   Technical Details Indication: 71 yo male with severe AS, syncope.   Procedure: The risks, benefits, complications, treatment options, and expected outcomes were discussed with the patient. The patient and/or family concurred with the proposed plan, giving informed consent. The patient was brought to the cath lab after IV hydration was begun and oral premedication was given. The patient  was further sedated with Versed and Fentanyl. The right groin was prepped and draped in the usual manner. Using the modified Seldinger access technique, a 5 French sheath was placed in the right femoral artery and a 7 French sheath was placed in the right femoral vein. A balloon tipped catheter was used to perform a right heart catheterization. Standard diagnostic catheters were used to perform selective coronary angiography. There was difficulty engaging the native RCA. Non-selective angiography was performed with a 3DRC catheter placed near the ostium of the vessel. I crossed the aortic valve with an AL-1 and a straight wire. LV pressures measured.   There were no immediate complications. The patient was taken to the recovery area in stable condition.    Estimated blood loss <50 mL.  During this procedure the patient was administered the following to achieve and maintain moderate conscious sedation: Versed 2 mg, Morphine 100 mg, while the patient's heart rate, blood pressure, and oxygen saturation were continuously monitored. The period of conscious sedation was 50 minutes, of which I was present face-to-face 100% of this time.    Complications   Complications documented before study signed (04/30/2016 6:46 PM EST)    RIGHT/LEFT HEART CATH AND CORONARY ANGIOGRAPHY   None Documented by Kathleene Hazel, MD 04/30/2016 6:27 PM EST  Time Range: Intra-procedure      Coronary Findings   Dominance: Right  Left Anterior Descending  Vessel is large.  First Diagonal Branch  Vessel is  small in size.  Second Diagonal Branch  Vessel is moderate in size.  Third Diagonal Branch  Vessel is small in size.  Ramus Intermedius  Vessel is small.  Left Circumflex  Ost Cx to Prox Cx lesion, 40% stenosed. The lesion is eccentric.  First Obtuse Marginal Branch  Vessel is moderate in size.  Second Obtuse Marginal Branch  Vessel is moderate in size.  Third Obtuse Marginal Branch  Vessel is moderate in size.  Right Coronary Artery  Vessel is large.  Right Posterior Descending Artery  Vessel is moderate in size.  Right Heart   Right Heart Pressures Hemodynamic findings consistent with aortic valve stenosis. Elevated LV EDP consistent with volume overload.    Left Heart   Aortic Valve There is severe aortic valve stenosis.    Coronary Diagrams   Diagnostic Diagram     Implants     No implant documentation for this case.  PACS Images   Show images for Cardiac catheterization   Link to Procedure Log   Procedure Log    Hemo Data   Flowsheet Row Most Recent Value  Fick Cardiac Output 6.38 L/min  Fick Cardiac Output Index 2.46 (L/min)/BSA  Aortic Mean Gradient 33.7 mmHg  Aortic Peak Gradient 27 mmHg  Aortic Valve Area 1.02  Aortic Value Area Index 0.39 cm2/BSA  RA A Wave 7 mmHg  RA V Wave 4 mmHg  RA Mean 3 mmHg  RV Systolic Pressure 38 mmHg  RV Diastolic Pressure 1 mmHg  RV EDP 6 mmHg  PA Systolic Pressure 33 mmHg  PA Diastolic Pressure 1 mmHg  PA Mean 21 mmHg  PW A Wave 16 mmHg  PW V Wave 8 mmHg  PW Mean 8 mmHg  AO Systolic Pressure 155 mmHg  AO Diastolic Pressure 71 mmHg  AO Mean 99 mmHg  LV Systolic Pressure 189 mmHg  LV Diastolic Pressure 4 mmHg  LV EDP 18 mmHg  Arterial Occlusion Pressure Extended Systolic Pressure 161 mmHg  Arterial Occlusion Pressure Extended Diastolic Pressure  73 mmHg  Arterial Occlusion Pressure Extended Mean Pressure 110 mmHg  Left Ventricular Apex Extended Systolic Pressure 188 mmHg  Left Ventricular Apex Extended  Diastolic Pressure 2 mmHg  Left Ventricular Apex Extended EDP Pressure 17 mmHg  QP/QS 1  TPVR Index 8.53 HRUI  TSVR Index 40.16 HRUI  PVR SVR Ratio 0.14  TPVR/TSVR Ratio 0.21    CT ANGIO CHEST PE W OR WO CONTRAST (Accession [id number]) (Order [id number])  Imaging  Date: 04/28/2016 Department: [facility] 3E CHF Released By/Authorizing: Renne Crigler, PA-C (auto-released)  Exam Information   Status Exam Begun  Exam Ended   Final [99] 04/29/2016 3:25 AM 04/29/2016 3:47 AM  PACS Images   Show images for CT ANGIO CHEST PE W OR WO CONTRAST  Study Result   CLINICAL DATA:  71 year old male with elevated D-dimer.  EXAM: CT ANGIOGRAPHY CHEST WITH CONTRAST  TECHNIQUE: Multidetector CT imaging of the chest was performed using the standard protocol during bolus administration of intravenous contrast. Multiplanar CT image reconstructions and MIPs were obtained to evaluate the vascular anatomy.  CONTRAST:  100 cc Isovue 370  COMPARISON:  Chest radiograph dated 04/28/2016  FINDINGS: Cardiovascular: There is mild cardiomegaly. No pericardial effusion. Mild coronary vascular calcification predominantly involving the LAD. Mild atherosclerotic calcification of the thoracic aorta. No aneurysmal dilatation or evidence of dissection. The origins of the great vessels of the aortic arch appear patent. Evaluation of the pulmonary arteries is limited due to suboptimal opacification of the distal branches. No large central pulmonary artery embolus identified.  Mediastinum/Nodes: There is no hilar or mediastinal adenopathy. The esophagus is grossly unremarkable.  Lungs/Pleura: Right lung base subsegmental atelectatic changes noted. Infiltrate is less likely but not excluded. Clinical correlation is recommended. There is no pleural effusion or pneumothorax. The central airways are patent.  Upper Abdomen: There is diffuse fatty infiltration of the liver. Slight  irregularity of the hepatic contour concerning for cirrhosis. There is mild eventration of the right hemidiaphragm.  Musculoskeletal: Mild degenerative changes of the spine. No acute fracture.  Review of the MIP images confirms the above findings.  IMPRESSION: No CT evidence of central pulmonary artery embolus.  Mild elevation of the right hemidiaphragm with right lung base subsegmental atelectasis/ scarring. Infiltrate is less likely. Clinical correlation is recommended.   Electronically Signed   By: Elgie Collard M.D.   On: 04/29/2016 04:17   RISK SCORES About the STS Risk Calculator Procedure: AV Replacement  Risk of Mortality: 4.42%  Morbidity or Mortality: 26.888%  Long Length of Stay: 14.365%  Short Length of Stay: 20.345%  Permanent Stroke: 0.834%  Prolonged Ventilation: 22.744%  DSW Infection: 1.571%  Renal Failure: 8.53%  Reoperation: 8.885%    Impression:  This 71 year old gentleman has stage D, severe, symptomatic aortic stenosis with progressive exertional dyspnea and fatigue ( NYHA class III), recurrent dizziness, and at least two syncopal episodes. I have personally reviewed his echo and cath films. His valve detail is difficult to see due to morbid obesity but the gradient is clearly in the severe range at 41 mm Hg. His cath shows no significant coronary artery disease with a mean gradient measured at 34 mm Hg. His CTA of the head and neck does not show any cerebrovascular cause for syncope so I think we have to assume it is due to his AS. 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 I  think AVR is indicated in this patient. He would be a high risk patient for open surgical AVR due to morbid obesity, prior stroke with disability and multiple other comorbid factors. I think TAVR would be a better option for him and give him a much better chance of making a  functional recovery.   The patient was counseled at length regarding treatment alternatives for management of severe symptomatic aortic stenosis. Alternative approaches such as conventional aortic valve replacement, transcatheter aortic valve replacement, and palliative medical therapy were compared and contrasted at length. The risks associated with conventional surgical aortic valve replacement were discussed in detail, as were expectations for post-operative convalescence. Long-term prognosis with medical therapy was discussed.   We discussed complications that might develop including but not limited to risks of death, stroke, paravalvular leak, aortic dissection or other major vascular complications, aortic annulus rupture, device embolization, cardiac rupture or perforation, mitral regurgitation, acute myocardial infarction, arrhythmia, heart block or bradycardia requiring permanent pacemaker placement, congestive heart failure, respiratory failure, renal failure, pneumonia, infection, other late complications related to structural valve deterioration or migration, or other complications that might ultimately cause a temporary or permanent loss of functional independence or other long term morbidity. He would like to proceed with workup for TAVR.      Plan:  1. Gated Cardiac CT  2. CTA of the abdomen and pelvis to assess the vasculature  3.  Dental evaluation  4.  PT evaluation  5. PFT's  6. Second surgical evaluation  I spent 60 minutes performing this consultation and > 50% of this time was spent face to face counseling and coordinating the care of this patient's severe aortic stenosis.  Alleen Borne, MD 05/01/2016 10:15 AM

## 2016-05-02 ENCOUNTER — Inpatient Hospital Stay (HOSPITAL_COMMUNITY): Payer: Medicare Other

## 2016-05-02 ENCOUNTER — Inpatient Hospital Stay (HOSPITAL_COMMUNITY): Payer: Medicare Other | Admitting: Anesthesiology

## 2016-05-02 ENCOUNTER — Encounter (HOSPITAL_COMMUNITY): Payer: Self-pay | Admitting: Certified Registered Nurse Anesthetist

## 2016-05-02 ENCOUNTER — Encounter (HOSPITAL_COMMUNITY): Payer: Self-pay

## 2016-05-02 ENCOUNTER — Encounter (HOSPITAL_COMMUNITY)
Admission: EM | Disposition: A | Discharge status: Home Health | Payer: Self-pay | Source: Home / Self Care | Attending: Internal Medicine

## 2016-05-02 DIAGNOSIS — R0902 Hypoxemia: Secondary | ICD-10-CM

## 2016-05-02 DIAGNOSIS — I350 Nonrheumatic aortic (valve) stenosis: Secondary | ICD-10-CM

## 2016-05-02 DIAGNOSIS — Z01818 Encounter for other preprocedural examination: Secondary | ICD-10-CM

## 2016-05-02 HISTORY — PX: MULTIPLE EXTRACTIONS WITH ALVEOLOPLASTY: SHX5342

## 2016-05-02 LAB — APTT: aPTT: 33 seconds (ref 24–36)

## 2016-05-02 LAB — CBC
HEMATOCRIT: 39.9 % (ref 39.0–52.0)
HEMOGLOBIN: 13 g/dL (ref 13.0–17.0)
MCH: 30.6 pg (ref 26.0–34.0)
MCHC: 32.6 g/dL (ref 30.0–36.0)
MCV: 93.9 fL (ref 78.0–100.0)
Platelets: 262 10*3/uL (ref 150–400)
RBC: 4.25 MIL/uL (ref 4.22–5.81)
RDW: 14.4 % (ref 11.5–15.5)
WBC: 8.2 10*3/uL (ref 4.0–10.5)

## 2016-05-02 LAB — HEPARIN LEVEL (UNFRACTIONATED): HEPARIN UNFRACTIONATED: 0.32 [IU]/mL (ref 0.30–0.70)

## 2016-05-02 LAB — PULMONARY FUNCTION TEST
DL/VA % pred: 98 %
DL/VA: 4.69 ml/min/mmHg/L
DLCO UNC % PRED: 59 %
DLCO unc: 21.67 ml/min/mmHg
FEF 25-75 Pre: 1.44 L/sec
FEF2575-%Pred-Pre: 53 %
FEV1-%PRED-PRE: 51 %
FEV1-PRE: 1.88 L
FEV1FVC-%Pred-Pre: 82 %
FEV6-%Pred-Pre: 64 %
FEV6-Pre: 2.99 L
FEV6FVC-%Pred-Pre: 104 %
FVC-%PRED-PRE: 63 %
FVC-Pre: 3.1 L
PRE FEV1/FVC RATIO: 61 %
Pre FEV6/FVC Ratio: 99 %

## 2016-05-02 LAB — BASIC METABOLIC PANEL
Anion gap: 7 (ref 5–15)
BUN: 8 mg/dL (ref 6–20)
CALCIUM: 8.5 mg/dL — AB (ref 8.9–10.3)
CHLORIDE: 103 mmol/L (ref 101–111)
CO2: 27 mmol/L (ref 22–32)
Creatinine, Ser: 0.94 mg/dL (ref 0.61–1.24)
GFR calc non Af Amer: >60 mL/min (ref >60)
GLUCOSE: 107 mg/dL — AB (ref 65–99)
Potassium: 3.4 mmol/L — ABNORMAL LOW (ref 3.5–5.1)
Sodium: 137 mmol/L (ref 135–145)

## 2016-05-02 LAB — GLUCOSE, CAPILLARY
GLUCOSE-CAPILLARY: 148 mg/dL — AB (ref 65–99)
Glucose-Capillary: 133 mg/dL — ABNORMAL HIGH (ref 65–99)

## 2016-05-02 SURGERY — MULTIPLE EXTRACTION WITH ALVEOLOPLASTY
Anesthesia: General | Site: Mouth

## 2016-05-02 MED ORDER — LACTATED RINGERS IV SOLN: INTRAVENOUS | Status: DC

## 2016-05-02 MED ORDER — ROCURONIUM BROMIDE 10 MG/ML (PF) SYRINGE
PREFILLED_SYRINGE | INTRAVENOUS | Status: DC | PRN
Start: 2016-05-02 — End: 2016-05-02
  Administered 2016-05-02: 40 mg via INTRAVENOUS

## 2016-05-02 MED ORDER — ETOMIDATE 2 MG/ML IV SOLN
INTRAVENOUS | Status: DC | PRN
  Administered 2016-05-02: 20 mg via INTRAVENOUS

## 2016-05-02 MED ORDER — PHENYLEPHRINE 40 MCG/ML (10ML) SYRINGE FOR IV PUSH (FOR BLOOD PRESSURE SUPPORT)
PREFILLED_SYRINGE | INTRAVENOUS | Status: DC | PRN
  Administered 2016-05-02 (×4): 80 ug via INTRAVENOUS

## 2016-05-02 MED ORDER — ROCURONIUM BROMIDE 50 MG/5ML IV SOSY
PREFILLED_SYRINGE | INTRAVENOUS | Status: AC
  Filled 2016-05-02: qty 5

## 2016-05-02 MED ORDER — ETOMIDATE 2 MG/ML IV SOLN
INTRAVENOUS | Status: AC
  Filled 2016-05-02: qty 10

## 2016-05-02 MED ORDER — LIDOCAINE-EPINEPHRINE 2 %-1:100000 IJ SOLN
INTRAMUSCULAR | Status: AC
  Filled 2016-05-02: qty 6.8

## 2016-05-02 MED ORDER — MUPIROCIN 2 % EX OINT
1.0000 "application " | TOPICAL_OINTMENT | Freq: Two times a day (BID) | CUTANEOUS | Status: AC
  Administered 2016-05-02 – 2016-05-06 (×9): 1 via NASAL
  Filled 2016-05-02 (×3): qty 22

## 2016-05-02 MED ORDER — ONDANSETRON HCL 4 MG/2ML IJ SOLN
INTRAMUSCULAR | Status: AC
  Filled 2016-05-02: qty 4

## 2016-05-02 MED ORDER — BUPIVACAINE-EPINEPHRINE (PF) 0.5% -1:200000 IJ SOLN
INTRAMUSCULAR | Status: AC
  Filled 2016-05-02: qty 3.6

## 2016-05-02 MED ORDER — DEXAMETHASONE SODIUM PHOSPHATE 10 MG/ML IJ SOLN
INTRAMUSCULAR | Status: DC | PRN
  Administered 2016-05-02: 10 mg via INTRAVENOUS

## 2016-05-02 MED ORDER — BUPIVACAINE-EPINEPHRINE 0.5% -1:200000 IJ SOLN
INTRAMUSCULAR | Status: DC | PRN
  Administered 2016-05-02: 3.6 mL

## 2016-05-02 MED ORDER — AMINOCAPROIC ACID SOLUTION 5% (50 MG/ML)
10.0000 mL | ORAL | Status: AC
  Administered 2016-05-02: 10 mL via ORAL
  Filled 2016-05-02 (×2): qty 100

## 2016-05-02 MED ORDER — SUCCINYLCHOLINE CHLORIDE 200 MG/10ML IV SOSY
PREFILLED_SYRINGE | INTRAVENOUS | Status: DC | PRN
  Administered 2016-05-02 (×2): 100 mg via INTRAVENOUS

## 2016-05-02 MED ORDER — IOPAMIDOL (ISOVUE-370) INJECTION 76%
INTRAVENOUS | Status: AC
  Administered 2016-05-02: 100 mL
  Filled 2016-05-02: qty 100

## 2016-05-02 MED ORDER — EPHEDRINE SULFATE-NACL 50-0.9 MG/10ML-% IV SOSY
PREFILLED_SYRINGE | INTRAVENOUS | Status: DC | PRN
  Administered 2016-05-02: 5 mg via INTRAVENOUS

## 2016-05-02 MED ORDER — PHENYLEPHRINE 40 MCG/ML (10ML) SYRINGE FOR IV PUSH (FOR BLOOD PRESSURE SUPPORT)
PREFILLED_SYRINGE | INTRAVENOUS | Status: AC
  Filled 2016-05-02: qty 10

## 2016-05-02 MED ORDER — FENTANYL CITRATE (PF) 250 MCG/5ML IJ SOLN
INTRAMUSCULAR | Status: AC
  Filled 2016-05-02: qty 5

## 2016-05-02 MED ORDER — HYDROMORPHONE HCL 1 MG/ML IJ SOLN: 0.2500 mg | INTRAMUSCULAR | Status: DC | PRN

## 2016-05-02 MED ORDER — CHLORHEXIDINE GLUCONATE CLOTH 2 % EX PADS
6.0000 | MEDICATED_PAD | Freq: Every day | CUTANEOUS | Status: DC
  Administered 2016-05-02 – 2016-05-05 (×4): 6 via TOPICAL

## 2016-05-02 MED ORDER — PRO-STAT SUGAR FREE PO LIQD
30.0000 mL | Freq: Three times a day (TID) | ORAL | Status: DC
  Administered 2016-05-02 – 2016-05-09 (×17): 30 mL via ORAL
  Filled 2016-05-02 (×17): qty 30

## 2016-05-02 MED ORDER — ONDANSETRON HCL 4 MG/2ML IJ SOLN
INTRAMUSCULAR | Status: DC | PRN
  Administered 2016-05-02: 4 mg via INTRAVENOUS

## 2016-05-02 MED ORDER — 0.9 % SODIUM CHLORIDE (POUR BTL) OPTIME
TOPICAL | Status: DC | PRN
  Administered 2016-05-02: 1000 mL

## 2016-05-02 MED ORDER — EPHEDRINE 5 MG/ML INJ
INTRAVENOUS | Status: AC
  Filled 2016-05-02: qty 10

## 2016-05-02 MED ORDER — LACTATED RINGERS IV SOLN
INTRAVENOUS | Status: DC
  Administered 2016-05-02 – 2016-05-06 (×4): via INTRAVENOUS

## 2016-05-02 MED ORDER — BOOST / RESOURCE BREEZE PO LIQD
1.0000 | Freq: Three times a day (TID) | ORAL | Status: DC
  Administered 2016-05-03 – 2016-05-05 (×7): 1 via ORAL

## 2016-05-02 MED ORDER — SUGAMMADEX SODIUM 500 MG/5ML IV SOLN
INTRAVENOUS | Status: DC | PRN
Start: 2016-05-02 — End: 2016-05-02
  Administered 2016-05-02: 300 mg via INTRAVENOUS

## 2016-05-02 MED ORDER — SUGAMMADEX SODIUM 500 MG/5ML IV SOLN
INTRAVENOUS | Status: AC
  Filled 2016-05-02: qty 5

## 2016-05-02 MED ORDER — SUGAMMADEX SODIUM 200 MG/2ML IV SOLN
INTRAVENOUS | Status: AC
  Filled 2016-05-02: qty 2

## 2016-05-02 MED ORDER — DEXAMETHASONE SODIUM PHOSPHATE 10 MG/ML IJ SOLN
INTRAMUSCULAR | Status: AC
  Filled 2016-05-02: qty 2

## 2016-05-02 MED ORDER — FENTANYL CITRATE (PF) 100 MCG/2ML IJ SOLN
INTRAMUSCULAR | Status: DC | PRN
  Administered 2016-05-02: 100 ug via INTRAVENOUS

## 2016-05-02 MED ORDER — SODIUM CHLORIDE 0.9 % IV SOLN
30.0000 meq | Freq: Once | INTRAVENOUS | Status: AC
  Administered 2016-05-02 (×2): 30 meq via INTRAVENOUS
  Filled 2016-05-02: qty 15

## 2016-05-02 MED ORDER — SUCCINYLCHOLINE CHLORIDE 200 MG/10ML IV SOSY
PREFILLED_SYRINGE | INTRAVENOUS | Status: AC
  Filled 2016-05-02: qty 10

## 2016-05-02 MED ORDER — MORPHINE SULFATE (PF) 2 MG/ML IV SOLN
2.0000 mg | INTRAVENOUS | Status: DC | PRN
Start: 2016-05-02 — End: 2016-05-07

## 2016-05-02 MED ORDER — IOPAMIDOL (ISOVUE-370) INJECTION 76%
INTRAVENOUS | Status: AC
  Administered 2016-05-02: 50 mL
  Filled 2016-05-02: qty 50

## 2016-05-02 MED ORDER — MIDAZOLAM HCL 2 MG/2ML IJ SOLN
INTRAMUSCULAR | Status: AC
  Filled 2016-05-02: qty 2

## 2016-05-02 MED ORDER — LIDOCAINE-EPINEPHRINE 2 %-1:100000 IJ SOLN
INTRAMUSCULAR | Status: DC | PRN
  Administered 2016-05-02: 5.1 mL via INTRADERMAL

## 2016-05-02 MED ORDER — PROMETHAZINE HCL 25 MG/ML IJ SOLN: 6.2500 mg | INTRAMUSCULAR | Status: DC | PRN

## 2016-05-02 MED ORDER — ADULT MULTIVITAMIN W/MINERALS CH
1.0000 | ORAL_TABLET | Freq: Every day | ORAL | Status: DC
  Administered 2016-05-02 – 2016-05-08 (×7): 1 via ORAL
  Filled 2016-05-02 (×7): qty 1

## 2016-05-02 SURGICAL SUPPLY — 36 items
ALCOHOL 70% 16 OZ (MISCELLANEOUS) ×3 IMPLANT
ATTRACTOMAT 16X20 MAGNETIC DRP (DRAPES) ×3 IMPLANT
BLADE SURG 15 STRL LF DISP TIS (BLADE) ×2 IMPLANT
BLADE SURG 15 STRL SS (BLADE) ×4
COVER SURGICAL LIGHT HANDLE (MISCELLANEOUS) ×3 IMPLANT
GAUZE PACKING FOLDED 2  STR (GAUZE/BANDAGES/DRESSINGS) ×2
GAUZE PACKING FOLDED 2 STR (GAUZE/BANDAGES/DRESSINGS) ×1 IMPLANT
GAUZE SPONGE 4X4 16PLY XRAY LF (GAUZE/BANDAGES/DRESSINGS) ×3 IMPLANT
GLOVE BIOGEL PI IND STRL 6 (GLOVE) ×1 IMPLANT
GLOVE BIOGEL PI INDICATOR 6 (GLOVE) ×2
GLOVE SURG ORTHO 8.0 STRL STRW (GLOVE) ×3 IMPLANT
GLOVE SURG SS PI 6.0 STRL IVOR (GLOVE) ×3 IMPLANT
GOWN STRL REUS W/ TWL LRG LVL3 (GOWN DISPOSABLE) ×1 IMPLANT
GOWN STRL REUS W/TWL 2XL LVL3 (GOWN DISPOSABLE) ×3 IMPLANT
GOWN STRL REUS W/TWL LRG LVL3 (GOWN DISPOSABLE) ×2
HEMOSTAT SURGICEL 2X14 (HEMOSTASIS) ×3 IMPLANT
KIT BASIN OR (CUSTOM PROCEDURE TRAY) ×3 IMPLANT
KIT ROOM TURNOVER OR (KITS) ×3 IMPLANT
MANIFOLD NEPTUNE WASTE (CANNULA) ×3 IMPLANT
NEEDLE BLUNT 16X1.5 OR ONLY (NEEDLE) ×3 IMPLANT
NS IRRIG 1000ML POUR BTL (IV SOLUTION) ×3 IMPLANT
PACK EENT II TURBAN DRAPE (CUSTOM PROCEDURE TRAY) ×3 IMPLANT
PAD ARMBOARD 7.5X6 YLW CONV (MISCELLANEOUS) ×3 IMPLANT
SPONGE SURGIFOAM ABS GEL 100 (HEMOSTASIS) IMPLANT
SPONGE SURGIFOAM ABS GEL 12-7 (HEMOSTASIS) IMPLANT
SPONGE SURGIFOAM ABS GEL SZ50 (HEMOSTASIS) IMPLANT
SUCTION FRAZIER HANDLE 10FR (MISCELLANEOUS) ×2
SUCTION TUBE FRAZIER 10FR DISP (MISCELLANEOUS) ×1 IMPLANT
SUT CHROMIC 3 0 PS 2 (SUTURE) ×9 IMPLANT
SUT CHROMIC 4 0 P 3 18 (SUTURE) IMPLANT
SYR 50ML SLIP (SYRINGE) ×3 IMPLANT
TOWEL OR 17X26 10 PK STRL BLUE (TOWEL DISPOSABLE) ×3 IMPLANT
TUBE CONNECTING 12'X1/4 (SUCTIONS) ×1
TUBE CONNECTING 12X1/4 (SUCTIONS) ×2 IMPLANT
WATER TABLETS ICX (MISCELLANEOUS) ×3 IMPLANT
YANKAUER SUCT BULB TIP NO VENT (SUCTIONS) ×3 IMPLANT

## 2016-05-02 NOTE — Progress Notes (Signed)
Pt's surgical pcr resulted positive, K. Schorr NP notified, standing order initiated.

## 2016-05-02 NOTE — Anesthesia Postprocedure Evaluation (Addendum)
Anesthesia Post Note  Patient: Jorge DroughtGeorge O [name]  Procedure(s) Performed: Procedure(s) (LRB): Extraction of tooth #'s 7, 10, 23, 24, 25,and 26 with alveoloplasty. (N/A)  Patient location during evaluation: PACU Anesthesia Type: General Level of consciousness: sedated Pain management: pain level controlled Vital Signs Assessment: post-procedure vital signs reviewed and stable Respiratory status: spontaneous breathing and respiratory function stable Cardiovascular status: stable Anesthetic complications: no       Last Vitals:  Vitals:   05/02/16 1305 05/02/16 1345  BP: (!) 154/58 110/88  Pulse: 71 73  Resp: 19 18  Temp: 37.2 C                 Ryley Teater DANIEL

## 2016-05-02 NOTE — Op Note (Signed)
OPERATIVE REPORT  Patient:            Jorge DroughtGeorge O Lieske Date of Birth:  1945-08-26 MRN:                [id number]   DATE OF PROCEDURE:  05/02/2016  PREOPERATIVE DIAGNOSES: 1. Severe aortic stenosis 2. Pre-heart valve surgery dental protocol 3. Multiple retained root segments 4. Dental caries 5. Chronic periodontitis 6. Loose teeth  POSTOPERATIVE DIAGNOSES: 1. Severe aortic stenosis 2. Pre-heart valve surgery dental protocol 3. Multiple retained root segments 4. Dental caries 5. Chronic periodontitis 6. Loose teeth  OPERATIONS: 1. Multiple extraction of tooth numbers 7, 10, 23, 24, 25, and 26 2. 2 Quadrants of alveoloplasty   SURGEON: Charlynne Panderonald F. Hildred Pharo, DDS  ASSISTANT: Rory PercyLisa Ingram, (dental assistant)  ANESTHESIA: General anesthesia via oral endotracheal tube.  MEDICATIONS: 1. Ancef 3 g IV prior to invasive dental procedures. 2. Local anesthesia with a total utilization of 4 carpules each containing 34 mg of lidocaine with 0.017 mg of epinephrine as well as 2 carpules each containing 9 mg of bupivacaine with 0.009 mg of epinephrine.  SPECIMENS: There are 6 teeth that were discarded.  DRAINS: None  CULTURES: None  COMPLICATIONS: None   ESTIMATED BLOOD LOSS: 100 mLs.  INTRAVENOUS FLUIDS: Fluids per anesthesia report  INDICATIONS: The patient was recently diagnosed with severe aortic stenosis.  A dental consultation was then requested to evaluate poor dentition.  The patient was examined and treatment planned for multiple extraction of indicated teeth with alveoloplasty.  Patient would also have initial periodontal therapy as patient condition will allow in the OR. This treatment plan was formulated to decrease the risks and complications associated with dental infection from affecting the patient's systemic health and anticipated heart valve surgery.  OPERATIVE FINDINGS: Patient was examined operating room number 15.  The teeth were identified for extraction. It  was determined tooth numbers 7, 10, 23 through 26 would be extracted at this time. The patient was noted be affected by chronic periodontitis, loose teeth, retained root segments, dental caries, and accretions.  DESCRIPTION OF PROCEDURE: Patient was brought to the main operating room number 15. Patient was then kept in his hospital bed in the supine position. General anesthesia was then induced per the anesthesia team. The patient was then prepped and draped in the usual manner for dental medicine procedure. A timeout was performed. The patient was identified and procedures were verified. A throat pack was placed at this time. The oral cavity was then thoroughly examined with the findings noted above. The patient was then ready for dental medicine procedure as follows:  Local anesthesia was then administered sequentially with a total utilization of 4 carpules each containing 34 mg of lidocaine with 0.017 mg of epinephrine as well as 2 carpules  each containing 9 mg bupivacaine with 0.009 mg of epinephrine.  The Maxillary anterior quadrants first approached. Anesthesia was then delivered utilizing infiltration with lidocaine with epinephrine. A #15 blade incision was then made around tooth numbers 7 and 10 on both the facial and palatal aspect.  A  Sulcular surgical flap was then carefully reflected.  Tooth numbers 7 and then removed with a rongeurs without complications. Sockets were curetted and compressed appropriately.  The surgical sites were then irrigated with copious amounts of sterile saline.  A piece of Surgicel was palced in each socket appropriately. The surgical sites were then closed utilizing 3-0 chromic gut suture and multiple interrupted sutures.  Caries were noted on the facial  aspect of tooth numbers 8 and 9 and on the mesial of #6. These can be addressed in the future by the dentist of his choice once the patient is medically stable after the anticipated heart valve surgery.  At this  point time, the mandibular quadrants were approached. The patient was given bilateral mental nerve blocks. Further infiltration was then achieved utilizing the lidocaine with epinephrine. A 15 blade incision was then made from the mesial of #22 and extended the mesial #27. A surgical flap was then carefully reflected. Tooth numbers 23-26 were then subluxated with a series of straight elevators. Tooth numbers 23-26 were then removed utilizing a 151 forceps. Alveoloplasty was then performed utilizing a rongeurs and bone file. To help achieve primary closure.  The tissues were approximated and trimmed appropriately. The surgical sites were then irrigated with copious amounts of sterile saline. Surgicel was then placed in the extraction sockets appropriately. The mandibular left and right anterior surgical site was then closed from the mesial of #22 and extended to the mesial #27 utilizing 3-0 chromic gut suture in a continuous suture technique 1.   At This point in time, it was determined that the gross debridement procedure would NOT be performed at this time due to the potential for significant bleeding while on Heparin therapy.   At this point time, the entire mouth was irrigated with copious amounts of sterile saline. The patient was examined for complications, seeing none, the dental medicine procedure was deemed to be complete. The throat pack was removed at this time. An oral airway was then placed at the request of the anesthesia team. A series of 4 x 4 gauze moistened with amicar 5% rinse  were placed in the mouth to aid hemostasis. The patient was then handed over to the anesthesia team for final disposition. After an appropriate amount of time, the patient was extubated and taken to the postanesthsia care unit in good condition. All counts were correct for the dental medicine procedure. The patient is to continue Amicar 5% rinse post operatively. Patient is to rinse with 10 mls by mouth every hour for  10 hours in a swish and spit manner and then as needed for persistent oozing. Heparin therapy may be started by the Pharmacy team with no bolus at midnight.  Patient will then be scheduled for aortic valve replacement of the discretion of Dr. Laneta Simmers.    Charlynne Pander, DDS.

## 2016-05-02 NOTE — Progress Notes (Signed)
PRE-OPERATIVE NOTE:  05/02/2016 Jorge DroughtGeorge O Dombeck [id number]  VITALS: BP 109/73 (BP Location: Right Arm)   Pulse (!) 58   Temp 97.8 F (36.6 C) (Oral)   Resp 18   Ht 6\' 2"  (1.88 m)   Wt (!) 313 lb 6.4 oz (142.2 kg) Comment: scale c  SpO2 94%   BMI 40.24 kg/m   Lab Results  Component Value Date   WBC 8.2 05/02/2016   HGB 13.0 05/02/2016   HCT 39.9 05/02/2016   MCV 93.9 05/02/2016   PLT 262 05/02/2016   BMET    Component Value Date/Time   NA 137 05/02/2016 0432   K 3.4 (L) 05/02/2016 0432   CL 103 05/02/2016 0432   CO2 27 05/02/2016 0432   GLUCOSE 107 (H) 05/02/2016 0432   BUN 8 05/02/2016 0432   CREATININE 0.94 05/02/2016 0432   CALCIUM 8.5 (L) 05/02/2016 0432   GFRNONAA >60 05/02/2016 0432   GFRAA >60 05/02/2016 0432    Lab Results  Component Value Date   INR 1.16 05/17/2015   INR 1.04 05/24/2014   No results found for: PTT   Jorge DroughtGeorge O Bouchie presents for multiple dental extractions with alveoloplasty and initial periodontal therapy in the operating room.. Patient is aware that additional teeth may be extracted if it is determined that the teeth need to be extracted after a more comprehensive evaluation in the operating room.   SUBJECTIVE: The patient denies any acute medical or dental changes and agrees to proceed with treatment as planned.  EXAM: No sign of acute dental changes.  ASSESSMENT: Patient is affected by multiple retained root segments, dental caries, chronic periodontitis, loose teeth, and accretions.  PLAN: Patient agrees to proceed with treatment as planned in the operating room as previously discussed and accepts the risks, benefits, and complications of the proposed treatment. Patient is aware of the risk for bleeding, bruising, swelling, infection, pain, nerve damage, soft tissue damage, damage to adjacent teeth, sinus involvement, root tip fracture, mandible fracture, and the risks of complications associated with the anesthesia. Patient  also is aware of the potential for other complications up to and including death due to his overall cardiovascular and respiratory compromise.    Charlynne Panderonald F. Lashae Wollenberg, DDS

## 2016-05-02 NOTE — Progress Notes (Addendum)
ANTICOAGULATION CONSULT NOTE  Pharmacy Consult for Heparin Indication: chest pain/ACS  Allergies  Allergen Reactions  . Coumadin [Warfarin Sodium] Rash    Patient Measurements: Height: 6\' 2"  (188 cm) Weight: (!) 313 lb 6.4 oz (142.2 kg) (scale c) IBW/kg (Calculated) : 82.2 Heparin Dosing Weight: 115.4 kg  Vital Signs: Temp: 97.8 F (36.6 C) (01/26 0623) Temp Source: Oral (01/26 0623) BP: 109/73 (01/26 0623) Pulse Rate: 58 (01/26 0623)  Labs:  Recent Labs  04/29/16 0856  04/29/16 1145  04/29/16 2325 04/30/16 0547 04/30/16 0812 04/30/16 1104 05/01/16 0437 05/01/16 2014 05/02/16 0432  HGB 12.8*  --   --   --   --   --   --   --  13.4  --  13.0  HCT 40.1  --   --   --   --   --   --   --  41.6  --  39.9  PLT 309  --   --   --   --   --   --   --  290  --  262  APTT  --   --  55*  < >  --  >200* 45*  --   --  63* 33  HEPARINUNFRC  --   < > 0.73*  --   --  1.58* 0.51  --   --  0.60 0.32  CREATININE  --   --  1.01  --   --   --   --   --  0.88  --  0.94  TROPONINI  --   --  <0.03  < > <0.03 <0.03  --  <0.03  --   --   --   < > = values in this interval not displayed.  Estimated Creatinine Clearance: 109.8 mL/min (by C-G formula based on SCr of 0.94 mg/dL).   Medical History: Past Medical History:  Diagnosis Date  . Atrial fibrillation (HCC)    pt on Eliquis  . Chest pain   . CHF (congestive heart failure) (HCC)   . Edema   . HTN (hypertension)   . Hyperlipidemia   . Neuropathy (HCC)   . Obesity   . SOB (shortness of breath)   . Stroke [facility])     Medications:  Prescriptions Prior to Admission  Medication Sig Dispense Refill Last Dose  . ALPRAZolam (XANAX) 0.5 MG tablet Take 0.5 mg by mouth at bedtime.    04/27/2016 at Unknown time  . apixaban (ELIQUIS) 5 MG TABS tablet Take 1 tablet (5 mg total) by mouth 2 (two) times daily. 60 tablet 0 04/27/2016 at 2200  . atorvastatin (LIPITOR) 40 MG tablet Take 1 tablet (40 mg total) by mouth daily at 6 PM. 30 tablet 0  04/27/2016 at Unknown time  . buPROPion (WELLBUTRIN SR) 150 MG 12 hr tablet Take 450 mg by mouth daily.    04/27/2016 at Unknown time  . furosemide (LASIX) 40 MG tablet Take 40 mg by mouth daily.   04/27/2016 at Unknown time  . gabapentin (NEURONTIN) 800 MG tablet Take 800 mg by mouth 3 (three) times daily.    04/27/2016 at Unknown time  . hydrOXYzine (ATARAX/VISTARIL) 25 MG tablet Take 25 mg by mouth 3 (three) times daily as needed.   Past Month at Unknown time  . metoprolol tartrate (LOPRESSOR) 25 MG tablet Take 0.5 tablets (12.5 mg total) by mouth 2 (two) times daily. (Patient taking differently: Take 25 mg by mouth 2 (two) times daily. )  30 tablet 0 04/27/2016 at 2200  . oxycodone (OXY-IR) 5 MG capsule Take 5 mg by mouth every 4 (four) hours as needed for pain.    04/27/2016 at Unknown time  . sertraline (ZOLOFT) 100 MG tablet Take 150 mg by mouth daily.    04/27/2016 at Unknown time  . traMADol (ULTRAM) 50 MG tablet Take 50 mg by mouth every 6 (six) hours as needed for moderate pain.    Past Month at Unknown time   Scheduled:  . ALPRAZolam  0.5 mg Oral QHS  . atorvastatin  40 mg Oral q1800  . buPROPion  450 mg Oral Daily  .  ceFAZolin (ANCEF) IV  3 g Intravenous Once  . Chlorhexidine Gluconate Cloth  6 each Topical Daily  . gabapentin  800 mg Oral TID  . mouth rinse  15 mL Mouth Rinse BID  . mupirocin ointment  1 application Nasal BID  . potassium chloride (KCL MULTIRUN) 30 mEq in 265 mL IVPB  30 mEq Intravenous Once  . sertraline  150 mg Oral Daily  . sodium chloride flush  3 mL Intravenous Q12H  . sodium chloride flush  3 mL Intravenous Q12H   Infusions:  . diltiazem (CARDIZEM) infusion 5 mg/hr (05/01/16 2122)    Assessment: 71 yo male with history of Afib on eliquis, HTN, HLD, OSA and CVA presents with syncope. Pharmacy is consulted to dose heparin for ACS/chest pain. Vascular surgery consulted and did not recommend further acute intervention at this time.  This morning's aPTT  remained subtherapeutic at 33 sec on heparin 2000 units/hr. Heparin was stopped at 0415 in preparation of teeth extraction procedure this morning.  Goal of Therapy:  Heparin level 0.3-0.7 units/ml aPTT 66-102 seconds Monitor platelets by anticoagulation protocol: Yes   Plan:  F/u anticoagulation restart    Arlean Hoppingorey M. Newman PiesBall, PharmD, BCPS Clinical Pharmacist [phone] 05/02/2016,8:28 AM

## 2016-05-02 NOTE — Discharge Instructions (Signed)

## 2016-05-02 NOTE — Progress Notes (Signed)
Initial Nutrition Assessment  DOCUMENTATION CODES:   Morbid obesity  INTERVENTION:  Boost Breeze po TID with meals, each supplement provides 250 kcal and 9 grams of protein Provide 30 ml Pro-Stat TID, each dose provides 15 grams of protein and 100 kcal Provide daily multivitamin   NUTRITION DIAGNOSIS:   Predicted suboptimal nutrient intake related to mouth pain as evidenced by other (see comment) (recent dental extractions and clear liquid diet).   GOAL:   Patient will meet greater than or equal to 90% of their needs   MONITOR:   Diet advancement, Supplement acceptance, PO intake, Labs, Weight trends, Skin, I & O's  REASON FOR ASSESSMENT:     Assessment of nutrition requirement/status (due to multiple extractions)  ASSESSMENT:   71 year old male with a past medical history of PAF (on Eliquis), HTN, HLD, obesity, OSA, AS, and CVA. He presented to the ED on 04/28/16 with syncope.   S/P Multiple extraction of tooth numbers 7, 10, 23, 24, 25, and 26. Pre-heart valve surgery dental protocol.   Pt on clear liquids. RD met with pt on 1/23 at which time pt reported having a good appetite and eating well. Pt had reported losing from 356 lbs to 325 lbs in the past year. Pt's weight now down an additional 12 lbs to 313 lbs. Pt out of room at time of visit.  Suspect PO intake will be sub-optimal, especially in protein and vitamins/minerals. Due to concern for optimizing nutritional status prior to heart surgery, will provide nutritional supplements.   Labs: low calcium, low potassium  Diet Order:  Diet clear liquid Room service appropriate? No; Fluid consistency: Thin  Skin:  Reviewed, no issues (closed incision)  Last BM:  1/22  Height:   Ht Readings from Last 1 Encounters:  04/28/16 '6\' 2"'  (1.88 m)    Weight:   Wt Readings from Last 1 Encounters:  05/02/16 (!) 313 lb 6.4 oz (142.2 kg)    Ideal Body Weight:  86.36 kg  BMI:  Body mass index is 40.24 kg/m.  Estimated  Nutritional Needs:   Kcal:  2100-2300  Protein:  120-130 grams  Fluid:  2.3 L/day  EDUCATION NEEDS:   No education needs identified at this time  [place], CSP, LDN Inpatient Clinical Dietitian Pager: [phone] After Hours Pager: [phone]

## 2016-05-02 NOTE — Progress Notes (Signed)
PROGRESS NOTE    Jorge [name]  AVW:[id number] DOB: 09/12/1945 DOA: 04/28/2016 PCP: [name], PA-C   Chief Complaint  Patient presents with  . Loss of Consciousness    Brief Narrative:  HPI on 04/28/2016 by Ms. [name], PA Jorge [name] is a 71 y.o. male with a history of paroxysmal atrial fibrillation on Eliquis , DCHF,  AS,  hypertension, hyperlipidemia, obey CT, prior history of stroke, carotid artery disease, brought to the ED after a syncopal episode. In review, she was shaving around 8:30 this morning, when he turned his head to the right, accompanied by some prodromal symptoms including nausea, and "shinning lights:, falling an hitting his head against the bathtub. He had lost consciousness for several minutes, but remained on the floor till 11 am, when he was found by his caretaker and EMS was called. Patient reported increased anxiety a the time, and complains of vague chest pain, without palpitations. He reports increasing shortness of breath without cough. He denies any jaw pain or left arm pain. He reports having similar dizzy spells over the last year but no presyncopal or syncopal events. No dysarthria or dysphagia. Denies any unilateral weakness or sensory deficiencies.  Denies any confusion. Of note, patient has trouble finding words since his last stroke. No seizures reported.  Denies any fever or chills, or night sweats. Denies any abdominal pain, or diarrhea. Denies any sick contacts or new foods. Denies any recent long distance trips. No recent surgeries. Denies worsening lower extremity swelling. Denies abnormal skin rashes, or neuropathy. Compliant with  medications. Does not smoke.  Assessment & Plan   Hypotension -possibly one cause of patient's syncope (along with aortic stenosis) -Unclear etiology for hypotension.  -upon admission denied fever, cough, chest pain, UR symptoms, no abdominal pain. Had some diarrhea few days ago which has resolved.    -Cardiology consulted and appreciated -Echocardiogram 04/29/2016: EF 55-60%, grade 1 diastolic dysfunction. Severe AS -Cefepime started empirically given that patient also had leukocytosis- discontinued 05/01/2016  Syncope  -D-Dimer 3.28, EKG unrevealing.  -CT head negative for acute intracranial abnormalities. -CT angio chest negative for central PE.  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  Widely patent vertebral arteries within the neck. There is a irregular curvilinear filling defect within the proximal left ICA, likely a small dissection flap (series 7, image 210). -Due to abnormal CT angio neck, vascular surgery consulted and appreciated  Severe aortic stenosis -Possibly cause of patient's syncope. -Noted on Echocardiogram and cardiac cath -CT surgery consulted and appreciated  ?Left ICA dissection -CTA neck as above -Vascular surgery consulted and did not recommend further acute intervention at this time. Patient already on anticoagulation  -s/p L CEA 2014- follows with Dr. Darrick Penna   Dental caries -Dentistry consulted, planned for multiple tooth extractions today.  Elevated troponin/NSTEMI -Echo as above -R/LHC: mild non-obstructive CAD, severe aortic stenosis (mean gradient 33.28mmHg)  Hypertension  -Hold BP medications as patient was hypotensive.   Paroxysmal Atrial Fibrillation  -CHA2DS2-VASc score 6  -Continue anticoagulation, currently Eliquis held due to Cath.   -Was placed on heparin- but was discontinued yesterday after cath. -Given current Afib, heparin held for dental surgery  Hyperlipidemia -Continue statin  Peripheral Neuropathy -Continue Neurontin   Depression -Continue home Zoloft   Generalized weakness -PT and OT consulted- rec  SNF  Constipation -Will add on bowel regimen after procedure today  Left arm fracture -present on admission. Secondary to fall -patient does not know who put the cast on -CareEverywhere shows no outside records -Will consult ortho and repeat Xrays  DVT Prophylaxis  Heparin (on hold)  Code Status: Full  Family Communication: none at bedside  Disposition Plan: Admitted.   Consultants Cardiology Vascular surgery  Cardiothoracic surgery  Procedures  Echocardiogram R/LHC  Antibiotics   Anti-infectives    Start     Dose/Rate Route Frequency Ordered Stop   05/02/16 1015  ceFAZolin (ANCEF) 3 g in dextrose 5 % 50 mL IVPB     3 g 130 mL/hr over 30 Minutes Intravenous  Once 05/01/16 1602     04/29/16 2200  vancomycin (VANCOCIN) IVPB 750 mg/150 ml premix  Status:  Discontinued     750 mg 150 mL/hr over 60 Minutes Intravenous Every 12 hours 04/29/16 0939 05/01/16 1021   04/29/16 0930  ceFEPIme (MAXIPIME) 2 g in dextrose 5 % 50 mL IVPB  Status:  Discontinued     2 g 100 mL/hr over 30 Minutes Intravenous Every 12 hours 04/29/16 0859 05/01/16 1021   04/29/16 0930  vancomycin (VANCOCIN) IVPB 1000 mg/200 mL premix     1,000 mg 200 mL/hr over 60 Minutes Intravenous  Once 04/29/16 0859 04/29/16 1143      Subjective:   Jorge [name] seen and examined today. Complains of feeling weak.  Denies chest pain, shortness of breath, abdominal pain, nausea or vomiting, diarrhea. Complains of constipation.  Objective:   Vitals:   05/01/16 1554 05/01/16 2107 05/02/16 0007 05/02/16 0623  BP: 99/60 (!) 118/58 (!) 144/60 109/73  Pulse: 79 66 63 (!) 58  Resp: 18 18 18 18   Temp: 97.9 F (36.6 C) 98.3 F (36.8 C) 98.3 F (36.8 C) 97.8 F (36.6 C)  TempSrc: Oral Oral Oral Oral  SpO2: 98% 97% 94% 94%  Weight:    (!) 142.2 kg (313 lb 6.4 oz)  Height:        Intake/Output Summary (Last 24 hours) at 05/02/16 1109 Last data filed at 05/02/16 1610  Gross per 24 hour  Intake             1120  ml  Output             1625 ml  Net             -505 ml   Filed Weights   04/30/16 0632 05/01/16 0539 05/02/16 0623  Weight: (!) 143.8 kg (317 lb) (!) 141.9 kg (312 lb 14.4 oz) (!) 142.2 kg (313 lb 6.4 oz)    Exam  General: Well developed, well nourished, NAD, appears stated age  HEENT: NCAT,mucous membranes moist.   Neck: Supple, no JVD, no masses. Left carotid bruit  Cardiovascular: S1 S2 auscultated,3/6SEM-harsh, irregular  Respiratory: Clear to auscultation bilaterally with equal chest rise  Abdomen: Soft, obese, nontender, nondistended, + bowel sounds  Extremities: warm dry without cyanosis clubbing or edema. LUE in cast  Neuro: AAOx3, nonfocal  Psych: Normal affect and demeanor    Data Reviewed: I have personally reviewed following labs and imaging studies  CBC:  Recent Labs Lab 04/28/16 1249 04/29/16 0856 05/01/16 0437 05/02/16 0432  WBC 14.2* 9.7 9.5 8.2  HGB 13.7 12.8* 13.4 13.0  HCT 42.8 40.1 41.6 39.9  MCV 94.5 95.0 94.5 93.9  PLT 306 309 290 262   Basic Metabolic Panel:  Recent Labs Lab 04/28/16 1209 04/29/16 1145  05/01/16 0437 05/02/16 0432  NA 138 138 141 137  K 3.7 3.7 3.7 3.4*  CL 103 107 104 103  CO2 26 24 26 27   GLUCOSE 132* 141* 107* 107*  BUN 11 11 6 8   CREATININE 0.99 1.01 0.88 0.94  CALCIUM 8.9 8.2* 8.6* 8.5*   GFR: Estimated Creatinine Clearance: 109.8 mL/min (by C-G formula based on SCr of 0.94 mg/dL). Liver Function Tests:  Recent Labs Lab 04/28/16 1209  AST 21  ALT 17  ALKPHOS 219*  BILITOT 0.5  PROT 7.3  ALBUMIN 3.8   No results for input(s): LIPASE, AMYLASE in the last 168 hours. No results for input(s): AMMONIA in the last 168 hours. Coagulation Profile: No results for input(s): INR, PROTIME in the last 168 hours. Cardiac Enzymes:  Recent Labs Lab 04/28/16 1249  04/29/16 1145 04/29/16 1653 04/29/16 2325 04/30/16 0547 04/30/16 1104  CKTOTAL 148  --   --   --   --   --   --   TROPONINI  --   < >  <0.03 <0.03 <0.03 <0.03 <0.03  < > = values in this interval not displayed. BNP (last 3 results) No results for input(s): PROBNP in the last 8760 hours. HbA1C: No results for input(s): HGBA1C in the last 72 hours. CBG:  Recent Labs Lab 04/28/16 1235 04/29/16 0616 05/01/16 0621 05/01/16 1544 05/02/16 0624  GLUCAP 136* 121* 95 160* 148*   Lipid Profile: No results for input(s): CHOL, HDL, LDLCALC, TRIG, CHOLHDL, LDLDIRECT in the last 72 hours. Thyroid Function Tests: No results for input(s): TSH, T4TOTAL, FREET4, T3FREE, THYROIDAB in the last 72 hours. Anemia Panel: No results for input(s): VITAMINB12, FOLATE, FERRITIN, TIBC, IRON, RETICCTPCT in the last 72 hours. Urine analysis:    Component Value Date/Time   COLORURINE YELLOW 04/29/2016 1233   APPEARANCEUR CLEAR 04/29/2016 1233   LABSPEC 1.021 04/29/2016 1233   PHURINE 6.0 04/29/2016 1233   GLUCOSEU NEGATIVE 04/29/2016 1233   HGBUR SMALL (A) 04/29/2016 1233   BILIRUBINUR NEGATIVE 04/29/2016 1233   KETONESUR NEGATIVE 04/29/2016 1233   PROTEINUR NEGATIVE 04/29/2016 1233   NITRITE NEGATIVE 04/29/2016 1233   LEUKOCYTESUR NEGATIVE 04/29/2016 1233   Sepsis Labs: @LABRCNTIP (procalcitonin:4,lacticidven:4)  ) Recent Results (from the past 240 hour(s))  Culture, blood (routine x 2)     Status: None (Preliminary result)   Collection Time: 04/29/16  9:20 AM  Result Value Ref Range Status   Specimen Description BLOOD RIGHT ARM  Final   Special Requests IN PEDIATRIC BOTTLE  2CC  Final   Culture NO GROWTH 3 DAYS  Final   Report Status PENDING  Incomplete  Culture, blood (routine x 2)     Status: None (Preliminary result)   Collection Time: 04/29/16  9:27 AM  Result Value Ref Range Status   Specimen Description BLOOD RIGHT HAND  Final   Special Requests IN PEDIATRIC BOTTLE  3 CC  Final   Culture NO GROWTH 3 DAYS  Final   Report Status PENDING  Incomplete  Urine culture     Status: None   Collection Time: 04/29/16 12:33 PM   Result Value Ref Range Status   Specimen Description URINE, CLEAN CATCH  Final   Special Requests NONE  Final   Culture NO GROWTH  Final   Report Status 04/30/2016 FINAL  Final  Surgical pcr screen     Status: Abnormal   Collection Time: 05/01/16  7:47 PM  Result Value Ref Range Status   MRSA, PCR NEGATIVE NEGATIVE Final  Staphylococcus aureus POSITIVE (A) NEGATIVE Final    Comment:        The Xpert SA Assay (FDA approved for NASAL specimens in patients over 67 years of age), is one component of a comprehensive surveillance program.  Test performance has been validated by [facility] for patients greater than or equal to 71 year old. It is not intended to diagnose infection nor to guide or monitor treatment.       Radiology Studies: Dg Orthopantogram  Result Date: 05/01/2016 CLINICAL DATA:  Preoperative dental evaluation EXAM: ORTHOPANTOGRAM/PANORAMIC COMPARISON:  CT 04/28/2016 FINDINGS: Residual dentition shows possible decay at teeth 7 and 10. Mandibular dentition shows a small radicular cyst at tooth 29. IMPRESSION: Possible advanced decay teeth 7 and 10. Small radicular cyst tooth 29. Electronically Signed   By: Paulina Fusi M.D.   On: 05/01/2016 13:01     Scheduled Meds: . [MAR Hold] ALPRAZolam  0.5 mg Oral QHS  . aminocaproic acid  10 mL Oral Q1H  . [MAR Hold] atorvastatin  40 mg Oral q1800  . [MAR Hold] buPROPion  450 mg Oral Daily  .  ceFAZolin (ANCEF) IV  3 g Intravenous Once  . [MAR Hold] Chlorhexidine Gluconate Cloth  6 each Topical Daily  . [MAR Hold] gabapentin  800 mg Oral TID  . [MAR Hold] mouth rinse  15 mL Mouth Rinse BID  . [MAR Hold] mupirocin ointment  1 application Nasal BID  . potassium chloride (KCL MULTIRUN) 30 mEq in 265 mL IVPB  30 mEq Intravenous Once  . [MAR Hold] sertraline  150 mg Oral Daily  . [MAR Hold] sodium chloride flush  3 mL Intravenous Q12H  . [MAR Hold] sodium chloride flush  3 mL Intravenous Q12H   Continuous Infusions: .  [MAR Hold] diltiazem (CARDIZEM) infusion 5 mg/hr (05/01/16 2122)  . lactated ringers       LOS: 3 days   Time Spent in minutes   30 minutes  Jordon Bourquin D.O. on 05/02/2016 at 11:09 AM  Between 7am to 7pm - Pager - [phone]  After 7pm go to [email or web address] - password [email or web address]  And look for the night coverage person covering for me after hours  [facility] Office  [phone]

## 2016-05-02 NOTE — Consult Note (Addendum)
Patient ID: Jorge [name] MRN: [id number] DOB/AGE: 71-Apr-1947 71 y.o.  Admit date: 04/28/2016 Referring Physician: Eden Emms Primary Physician: [name], PA-C Primary Cardiologist: Eden Emms Reason for Consultation: Severe aortic stenosis  HPI: 71 yo male with history of atrial fibrillation on Eliquis, HTN, HLD, prior CVA, obesity, OSA, CAD and aortic stenosis who was admitted to [facility] after several syncopal events. He has been followed as an outpatient by Dr. Eden Emms and is known to have aortic stenosis. Admitted 04/28/16 after syncopal event while standing in the bathroom. He became dizzy and felt lightheaded and passed out. Troponin minimally elevated. Cardiac cath 04/30/16 with mild non-obstructive disease in the Circumflex artery. Echo 04/29/16 with normal LV systolic function, severe AS with thickened AV leaflets with restricted motion, mean gradient 41 mmHg, 70 mmHg. He has had a prior syncopal event several months back while walking his dog. Neck CTA this admission with possible left ICA dissection, non flow limiting. He has been seen by Vascular surgery and no changes made. He has been seen by Dr. Laneta Simmers with CT surgery and while AVR is indicated, he is felt to be a better candidate for TAVR. He has been seen by Dr. Ileene Hutchinson and has undergone dental extraction surgery today.   He tells me today that he feels tired. No chest pain or dyspnea. He is in bed. No recurrent syncope since admission.    Past Medical History:  Diagnosis Date  . Atrial fibrillation (HCC)    pt on Eliquis  . Chest pain   . CHF (congestive heart failure) (HCC)   . Edema   . HTN (hypertension)   . Hyperlipidemia   . Neuropathy (HCC)   . Obesity   . SOB (shortness of breath)   . Stroke [facility])     Family History  Problem Relation Age of Onset  . Heart attack Mother   . Stroke Father     Social History   Social History  . Marital status: Divorced    Spouse name: N/A  . Number of children: N/A  .  Years of education: N/A   Occupational History  . Not on file.   Social History Main Topics  . Smoking status: Former Games developer  . Smokeless tobacco: Former Neurosurgeon    Quit date: 03/22/1996  . Alcohol use No  . Drug use: No  . Sexual activity: Not on file   Other Topics Concern  . Not on file   Social History Narrative  . No narrative on file    Past Surgical History:  Procedure Laterality Date  . BACK SURGERY    . CARDIAC CATHETERIZATION    . CARDIAC CATHETERIZATION N/A 04/30/2016   Procedure: Right/Left Heart Cath and Coronary Angiography;  Surgeon: Kathleene Hazel, MD;  Location[facility] INVASIVE CV LAB;  Service: Cardiovascular;  Laterality: N/A;  . CAROTID ANGIOGRAM N/A 05/30/2014   Procedure: Dorise Bullion;  Surgeon: Nada Libman, MD;  Location: [facility] CATH LAB;  Service: Cardiovascular;  Laterality: N/A;    Allergies  Allergen Reactions  . Coumadin [Warfarin Sodium] Rash    Hospital medications:  Scheduled Meds: . ALPRAZolam  0.5 mg Oral QHS  . aminocaproic acid  10 mL Oral Q1H  . atorvastatin  40 mg Oral q1800  . buPROPion  450 mg Oral Daily  . Chlorhexidine Gluconate Cloth  6 each Topical Daily  . feeding supplement  1 Container Oral TID WC  . feeding supplement (PRO-STAT SUGAR FREE 64)  30 mL Oral  TID BM  . gabapentin  800 mg Oral TID  . mouth rinse  15 mL Mouth Rinse BID  . multivitamin with minerals  1 tablet Oral QHS  . mupirocin ointment  1 application Nasal BID  . sertraline  150 mg Oral Daily  . sodium chloride flush  3 mL Intravenous Q12H  . sodium chloride flush  3 mL Intravenous Q12H   Continuous Infusions: . diltiazem (CARDIZEM) infusion 5 mg/hr (05/02/16 1300)  . lactated ringers    . lactated ringers     PRN Meds:.sodium chloride, acetaminophen **OR** acetaminophen, alum & mag hydroxide-simeth, bisacodyl, morphine injection, ondansetron **OR** ondansetron (ZOFRAN) IV, senna-docusate, sodium chloride flush   Review of systems complete and  found to be negative unless listed above    Physical Exam: Blood pressure 110/88, pulse 73, temperature 99 F (37.2 C), resp. rate 18, height 6\' 2"  (1.88 m), weight (!) 313 lb 6.4 oz (142.2 kg), SpO2 98 %.    General: Obese WM, NAD  HEENT: OP clear, mucus membranes moist  SKIN: warm, dry. No rashes.  Neuro: No focal deficits  Musculoskeletal: Muscle strength 5/5 all ext  Psychiatric: Mood and affect normal  Neck: No JVD, no carotid bruits, no thyromegaly, no lymphadenopathy.  Lungs:Clear bilaterally, no wheezes, rhonci, crackles  Cardiovascular: Regular rate and rhythm. Harsh systolic murmur. No gallops or rubs.  Abdomen:Soft. Bowel sounds present. Non-tender.  Extremities: No lower extremity edema. Pulses are 2 + in the bilateral DP/PT.  Echo 04/29/16: Left ventricle: The cavity size was normal. Wall thickness was   increased in a pattern of moderate LVH. Systolic function was   normal. The estimated ejection fraction was in the range of 55%   to 60%. Wall motion was normal; there were no regional wall   motion abnormalities. Doppler parameters are consistent with   abnormal left ventricular relaxation (grade 1 diastolic   dysfunction). Doppler parameters are consistent with high   ventricular filling pressure. - Aortic valve: There was severe stenosis. There was trivial   regurgitation. Aortic valve:   Trileaflet; moderately calcified leaflets. Doppler:   There was severe stenosis.   There was trivial regurgitation.    VTI ratio of LVOT to aortic valve: 0.39. Indexed valve area (VTI): 0.48 cm^2/m^2. Peak velocity ratio of LVOT to aortic valve: 0.34. Indexed valve area (Vmax): 0.42 cm^2/m^2. Mean velocity ratio of LVOT to aortic valve: 0.37. Indexed valve area (Vmean): 0.46 cm^2/m^2.    Mean gradient (S): 41 mm Hg. Peak gradient (S): 70 mm Hg. - Left atrium: The atrium was moderately dilated. - Pulmonary arteries: Systolic pressure was mildly increased. PA   peak pressure: 42  mm Hg (S).  Impressions:  - Technically difficult; definity used; normal LV systolic   function; grade 1 diastolic dysfunction with elevated LV filling   pressure; moderate LVH; calcified aortic valve with severe AS;   moderate LAE; mildly elevated pulmonary pressure.  Right/Left Heart Cath and Coronary Angiography  Conclusion     Ost Cx to Prox Cx lesion, 40 %stenosed.  There is severe aortic valve stenosis.  Hemodynamic findings consistent with aortic valve stenosis.  1. Mild non-obstructive CAD 2. Severe aortic stenosis (mean gradient 33.317mmHg, AVA 1.02 cm2).   Recommendations: It is likely that his syncope is due to his aortic stenosis. I will ask the primary team to call CT surgery in the am. He may not be a good candidate for surgical AVR. Dr. Cornelius Moraswen or Dr. Laneta SimmersBartle should be asked to see him so  they can discuss AVR vs TAVR. If he is not felt to be a surgical candidate, I will be glad to see him in consultation as well to discuss TAVR.    Indications   Severe aortic stenosis [I35.0 (ICD-10-CM)]  Procedural Details/Technique   Technical Details Indication: 71 yo male with severe AS, syncope.   Procedure: The risks, benefits, complications, treatment options, and expected outcomes were discussed with the patient. The patient and/or family concurred with the proposed plan, giving informed consent. The patient was brought to the cath lab after IV hydration was begun and oral premedication was given. The patient was further sedated with Versed and Fentanyl. The right groin was prepped and draped in the usual manner. Using the modified Seldinger access technique, a 5 French sheath was placed in the right femoral artery and a 7 French sheath was placed in the right femoral vein. A balloon tipped catheter was used to perform a right heart catheterization. Standard diagnostic catheters were used to perform selective coronary angiography. There was difficulty engaging the native RCA.  Non-selective angiography was performed with a 3DRC catheter placed near the ostium of the vessel. I crossed the aortic valve with an AL-1 and a straight wire. LV pressures measured.   There were no immediate complications. The patient was taken to the recovery area in stable condition.    Estimated blood loss <50 mL.  During this procedure the patient was administered the following to achieve and maintain moderate conscious sedation: Versed 2 mg, Morphine 100 mg, while the patient's heart rate, blood pressure, and oxygen saturation were continuously monitored. The period of conscious sedation was 50 minutes, of which I was present face-to-face 100% of this time.    Complications   Complications documented before study signed (04/30/2016 6:46 PM EST)    RIGHT/LEFT HEART CATH AND CORONARY ANGIOGRAPHY   None Documented by Kathleene Hazel, MD 04/30/2016 6:27 PM EST  Time Range: Intra-procedure      Coronary Findings   Dominance: Right  Left Anterior Descending  Vessel is large.  First Diagonal Branch  Vessel is small in size.  Second Diagonal Branch  Vessel is moderate in size.  Third Diagonal Branch  Vessel is small in size.  Ramus Intermedius  Vessel is small.  Left Circumflex  Ost Cx to Prox Cx lesion, 40% stenosed. The lesion is eccentric.  First Obtuse Marginal Branch  Vessel is moderate in size.  Second Obtuse Marginal Branch  Vessel is moderate in size.  Third Obtuse Marginal Branch  Vessel is moderate in size.  Right Coronary Artery  Vessel is large.  Right Posterior Descending Artery  Vessel is moderate in size.  Right Heart   Right Heart Pressures Hemodynamic findings consistent with aortic valve stenosis. Elevated LV EDP consistent with volume overload.    Left Heart   Aortic Valve There is severe aortic valve stenosis.    Coronary Diagrams   Diagnostic Diagram     Implants        No implant documentation for this case.  PACS  Images   Show images for Cardiac catheterization   Link to Procedure Log   Procedure Log    Hemo Data   Flowsheet Row Most Recent Value  Fick Cardiac Output 6.38 L/min  Fick Cardiac Output Index 2.46 (L/min)/BSA  Aortic Mean Gradient 33.7 mmHg  Aortic Peak Gradient 27 mmHg  Aortic Valve Area 1.02  Aortic Value Area Index 0.39 cm2/BSA  RA A Wave 7 mmHg  RA V  Wave 4 mmHg  RA Mean 3 mmHg  RV Systolic Pressure 38 mmHg  RV Diastolic Pressure 1 mmHg  RV EDP 6 mmHg  PA Systolic Pressure 33 mmHg  PA Diastolic Pressure 1 mmHg  PA Mean 21 mmHg  PW A Wave 16 mmHg  PW V Wave 8 mmHg  PW Mean 8 mmHg  AO Systolic Pressure 155 mmHg  AO Diastolic Pressure 71 mmHg  AO Mean 99 mmHg  LV Systolic Pressure 189 mmHg  LV Diastolic Pressure 4 mmHg  LV EDP 18 mmHg  Arterial Occlusion Pressure Extended Systolic Pressure 161 mmHg  Arterial Occlusion Pressure Extended Diastolic Pressure 73 mmHg  Arterial Occlusion Pressure Extended Mean Pressure 110 mmHg  Left Ventricular Apex Extended Systolic Pressure 188 mmHg  Left Ventricular Apex Extended Diastolic Pressure 2 mmHg  Left Ventricular Apex Extended EDP Pressure 17 mmHg  QP/QS 1  TPVR Index 8.53 HRUI  TSVR Index 40.16 HRUI  PVR SVR Ratio 0.14  TPVR/TSVR Ratio 0.21    CT ANGIO CHEST PE W OR WO CONTRAST (Accession [id number]) (Order [id number])  Imaging  Date: 04/28/2016 Department: [facility] 3E CHF Released By/Authorizing: Renne Crigler, PA-C (auto-released)  Exam Information   Status Exam Begun  Exam Ended   Final [99] 04/29/2016 3:25 AM 04/29/2016 3:47 AM  PACS Images   Show images for CT ANGIO CHEST PE W OR WO CONTRAST  Study Result   CLINICAL DATA: 71 year old male with elevated D-dimer.  EXAM: CT ANGIOGRAPHY CHEST WITH CONTRAST  TECHNIQUE: Multidetector CT imaging of the chest was performed using the standard protocol during bolus administration of intravenous contrast. Multiplanar CT image  reconstructions and MIPs were obtained to evaluate the vascular anatomy.  CONTRAST: 100 cc Isovue 370  COMPARISON: Chest radiograph dated 04/28/2016  FINDINGS: Cardiovascular: There is mild cardiomegaly. No pericardial effusion. Mild coronary vascular calcification predominantly involving the LAD. Mild atherosclerotic calcification of the thoracic aorta. No aneurysmal dilatation or evidence of dissection. The origins of the great vessels of the aortic arch appear patent. Evaluation of the pulmonary arteries is limited due to suboptimal opacification of the distal branches. No large central pulmonary artery embolus identified.  Mediastinum/Nodes: There is no hilar or mediastinal adenopathy. The esophagus is grossly unremarkable.  Lungs/Pleura: Right lung base subsegmental atelectatic changes noted. Infiltrate is less likely but not excluded. Clinical correlation is recommended. There is no pleural effusion or pneumothorax. The central airways are patent.  Upper Abdomen: There is diffuse fatty infiltration of the liver. Slight irregularity of the hepatic contour concerning for cirrhosis. There is mild eventration of the right hemidiaphragm.  Musculoskeletal: Mild degenerative changes of the spine. No acute fracture.  Review of the MIP images confirms the above findings.  IMPRESSION: No CT evidence of central pulmonary artery embolus.  Mild elevation of the right hemidiaphragm with right lung base subsegmental atelectasis/ scarring. Infiltrate is less likely. Clinical correlation is recommended.    Labs:   Lab Results  Component Value Date   WBC 8.2 05/02/2016   HGB 13.0 05/02/2016   HCT 39.9 05/02/2016   MCV 93.9 05/02/2016   PLT 262 05/02/2016    Recent Labs Lab 04/28/16 1209  05/02/16 0432  NA 138  < > 137  K 3.7  < > 3.4*  CL 103  < > 103  CO2 26  < > 27  BUN 11  < > 8  CREATININE 0.99  < > 0.94  CALCIUM 8.9  < > 8.5*  PROT 7.3  --   --  BILITOT 0.5  --   --   ALKPHOS 219*  --   --   ALT 17  --   --   AST 21  --   --   GLUCOSE 132*  < > 107*  < > = values in this interval not displayed. Lab Results  Component Value Date   CKTOTAL 148 04/28/2016   TROPONINI <0.03 04/30/2016    EKG: NSR, LAFB, LVH  ASSESSMENT AND PLAN:   1. Severe aortic valve stenosis: He has stage D symptomatic aortic valve stenosis. It is most likely that his syncopal events are due to his AS. I have personally reviewed the echo images. The aortic valve is thickened, calcified with limited leaflet mobility. I think he would benefit from AVR. Given his morbid obesity, I think he is a better candidate for TAVR than surgical AVR. I have reviewed the TAVR procedure in detail today with the patient. He would like to proceed with planning for TAVR. He has been seen by Dr. Laneta Simmers with CT surgery who also thinks TAVR is the best option for AVR. Dental extraction today per Dr. Ileene Hutchinson. His CT scans have been ordered (Cardiac CT to size valve and assess aortic root, CT chest/abd/pelvis to assess aorta and iliac arteries for access). Completing his CT scans in preparation for TAVR may be challenging given the cast on his left arm. Ortho should be called to see this weekend to see if we can remove his cast in order to be able to raise his hands above his head for CT scans. Will continue to follow. If we are able to complete his scans this weekend, we could try and arrange his TAVR procedure on Tuesday 05/06/16.    Signed: Verne Carrow, MD 05/02/2016, 5:52 PM

## 2016-05-02 NOTE — Progress Notes (Signed)
PT Cancellation Note  Patient Details Name: Jorge DroughtGeorge O Wiker MRN: [id number] DOB: [birth year]   Cancelled Treatment:    Reason Eval/Treat Not Completed: Patient at procedure or test/unavailable. Pt going to surgery for removal of teeth. New order for pre-TAVR assessment. Currently pt not mobilizing well enough to complete this formal assessment. Will continue to follow pt for PT and progress mobility as tolerated.   Angelina OkCary W Maycok 05/02/2016, 9:13 AM Skip Mayerary Evo Aderman PT [phone]

## 2016-05-02 NOTE — Care Management Important Message (Signed)
Important Message  Patient Details  Name: Jorge DroughtGeorge O Morgan MRN: [id number] Date of Birth: 1945-09-18   Medicare Important Message Given:  Other (see comment)    [name] 05/02/2016, 2:15 PM

## 2016-05-02 NOTE — Anesthesia Preprocedure Evaluation (Addendum)
Anesthesia Evaluation  Patient identified by MRN, date of birth, ID band Patient awake    Reviewed: Allergy & Precautions, NPO status , Patient's Chart, lab work & pertinent test results  History of Anesthesia Complications Negative for: history of anesthetic complications  Airway Mallampati: II  TM Distance: >3 FB Neck ROM: Full    Dental  (+) Poor Dentition, Dental Advisory Given   Pulmonary neg pulmonary ROS, former smoker,    Pulmonary exam normal        Cardiovascular hypertension, + CAD, + Past MI and +CHF  + dysrhythmias Atrial Fibrillation + Valvular Problems/Murmurs AS  Rhythm:Regular + Systolic murmurs Study Conclusions  - Left ventricle: The cavity size was normal. Wall thickness was   increased in a pattern of moderate LVH. Systolic function was   normal. The estimated ejection fraction was in the range of 55%   to 60%. Wall motion was normal; there were no regional wall   motion abnormalities. Doppler parameters are consistent with   abnormal left ventricular relaxation (grade 1 diastolic   dysfunction). Doppler parameters are consistent with high   ventricular filling pressure. - Aortic valve: There was severe stenosis. There was trivial   regurgitation. - Left atrium: The atrium was moderately dilated. - Pulmonary arteries: Systolic pressure was mildly increased. PA   peak pressure: 42 mm Hg (S).  Conclusion     Ost Cx to Prox Cx lesion, 40 %stenosed.  There is severe aortic valve stenosis.  Hemodynamic findings consistent with aortic valve stenosis.   1. Mild non-obstructive CAD 2. Severe aortic stenosis (mean gradient 33.527mmHg, AVA 1.02 cm2).   Recommendations: It is likely that his syncope is due to his aortic stenosis. I will ask the primary team to call CT surgery in the am. He may not be a good candidate for surgical AVR. Dr. Cornelius Moraswen or Dr. Laneta SimmersBartle should be asked to see him so they can discuss AVR  vs TAVR. If he is not felt to be a surgical candidate, I will be glad to see him in consultation as well to discuss TAVR.       Neuro/Psych TIACVA negative psych ROS   GI/Hepatic negative GI ROS, Neg liver ROS,   Endo/Other  Morbid obesity  Renal/GU negative Renal ROS  negative genitourinary   Musculoskeletal negative musculoskeletal ROS (+)   Abdominal   Peds negative pediatric ROS (+)  Hematology negative hematology ROS (+)   Anesthesia Other Findings   Reproductive/Obstetrics negative OB ROS                           Anesthesia Physical Anesthesia Plan  ASA: IV  Anesthesia Plan: General   Post-op Pain Management:    Induction: Intravenous  Airway Management Planned: Oral ETT  Additional Equipment:   Intra-op Plan:   Post-operative Plan: Extubation in OR  Informed Consent: I have reviewed the patients History and Physical, chart, labs and discussed the procedure including the risks, benefits and alternatives for the proposed anesthesia with the patient or authorized representative who has indicated his/her understanding and acceptance.   Dental advisory given  Plan Discussed with: Anesthesiologist, CRNA and Surgeon  Anesthesia Plan Comments:        Anesthesia Quick Evaluation

## 2016-05-02 NOTE — Transfer of Care (Signed)
Immediate Anesthesia Transfer of Care Note  Patient: Jorge DroughtGeorge O [name]  Procedure(s) Performed: Procedure(s): Extraction of tooth #'s 7, 10, 23, 24, 25,and 26 with alveoloplasty. (N/A)  Patient Location: PACU  Anesthesia Type:General  Level of Consciousness: awake, alert , oriented and patient cooperative  Airway & Oxygen Therapy: Patient Spontanous Breathing and Patient connected to face mask oxygen  Post-op Assessment: Report given to RN, Post -op Vital signs reviewed and stable and Patient moving all extremities X 4  Post vital signs: Reviewed and stable  Last Vitals:  Vitals:   05/02/16 0007 05/02/16 0623  BP: (!) 144/60 109/73  Pulse: 63 (!) 58  Resp: 18 18  Temp: 36.8 C 36.6 C    Last Pain:  Vitals:   05/02/16 0623  TempSrc: Oral  PainSc:       Patients Stated Pain Goal: 2 (05/01/16 [id number])  Complications: No apparent anesthesia complications

## 2016-05-02 NOTE — Anesthesia Procedure Notes (Signed)
Procedure Name: Intubation Date/Time: 05/02/2016 11:06 AM Performed by: Annabelle HarmanSMITH, Kaili Castille A Pre-anesthesia Checklist: Emergency Drugs available, Suction available, Patient identified and Patient being monitored Patient Re-evaluated:Patient Re-evaluated prior to inductionOxygen Delivery Method: Circle system utilized Preoxygenation: Pre-oxygenation with 100% oxygen Intubation Type: IV induction Ventilation: Mask ventilation without difficulty Laryngoscope Size: Glidescope and 4 Grade View: Grade I Tube type: Oral Tube size: 7.5 mm Number of attempts: 1 Airway Equipment and Method: Video-laryngoscopy and Rigid stylet Placement Confirmation: ETT inserted through vocal cords under direct vision,  positive ETCO2 and breath sounds checked- equal and bilateral Secured at: 25 cm Tube secured with: Tape Dental Injury: Teeth and Oropharynx as per pre-operative assessment  Difficulty Due To: Difficulty was anticipated, Difficult Airway- due to large tongue, Difficult Airway- due to reduced neck mobility and Difficult Airway- due to dentition

## 2016-05-02 NOTE — Progress Notes (Signed)
Patient Name: Jorge [name] Date of Encounter: 05/02/2016  Primary Cardiologist: Dr. Christeen Douglas Problem List     Active Problems:   Dyspnea   Severe aortic stenosis   History of CEA (carotid endarterectomy)   Chest pain   CAD (coronary artery disease)   History of tobacco abuse   HLD (hyperlipidemia)   CHF (congestive heart failure) (HCC)   Syncope   Hypotension   NSTEMI (non-ST elevated myocardial infarction) (HCC)     Subjective   Still weak A lot of his dizziness is postural CT scanner down yesterday and he is Getting his dental work done today not clear that TAVR CT;s will get done today I don't think he will be ready for TAVR on Tuesday   Inpatient Medications    Scheduled Meds: . [MAR Hold] ALPRAZolam  0.5 mg Oral QHS  . aminocaproic acid  10 mL Oral Q1H  . [MAR Hold] atorvastatin  40 mg Oral q1800  . [MAR Hold] buPROPion  450 mg Oral Daily  .  ceFAZolin (ANCEF) IV  3 g Intravenous Once  . [MAR Hold] Chlorhexidine Gluconate Cloth  6 each Topical Daily  . [MAR Hold] gabapentin  800 mg Oral TID  . [MAR Hold] mouth rinse  15 mL Mouth Rinse BID  . [MAR Hold] mupirocin ointment  1 application Nasal BID  . potassium chloride (KCL MULTIRUN) 30 mEq in 265 mL IVPB  30 mEq Intravenous Once  . [MAR Hold] sertraline  150 mg Oral Daily  . [MAR Hold] sodium chloride flush  3 mL Intravenous Q12H  . [MAR Hold] sodium chloride flush  3 mL Intravenous Q12H   Continuous Infusions: . [MAR Hold] diltiazem (CARDIZEM) infusion 5 mg/hr (05/01/16 2122)  . lactated ringers     PRN Meds: [MAR Hold] sodium chloride, [MAR Hold] acetaminophen **OR** [MAR Hold] acetaminophen, [MAR Hold] alum & mag hydroxide-simeth, [MAR Hold] bisacodyl, lidocaine-EPINEPHrine, [MAR Hold] ondansetron **OR** [MAR Hold] ondansetron (ZOFRAN) IV, [MAR Hold] senna-docusate, [MAR Hold] sodium chloride flush   Vital Signs    Vitals:   05/01/16 1554 05/01/16 2107 05/02/16 0007 05/02/16 0623  BP:  99/60 (!) 118/58 (!) 144/60 109/73  Pulse: 79 66 63 (!) 58  Resp: 18 18 18 18   Temp: 97.9 F (36.6 C) 98.3 F (36.8 C) 98.3 F (36.8 C) 97.8 F (36.6 C)  TempSrc: Oral Oral Oral Oral  SpO2: 98% 97% 94% 94%  Weight:    (!) 313 lb 6.4 oz (142.2 kg)  Height:        Intake/Output Summary (Last 24 hours) at 05/02/16 1043 Last data filed at 05/02/16 6578  Gross per 24 hour  Intake             1120 ml  Output             1625 ml  Net             -505 ml   Filed Weights   04/30/16 0632 05/01/16 0539 05/02/16 0623  Weight: (!) 317 lb (143.8 kg) (!) 312 lb 14.4 oz (141.9 kg) (!) 313 lb 6.4 oz (142.2 kg)    Physical Exam   GEN: Well nourished, well developed, in no acute distress. morbidly obese. lethargic HEENT: Grossly normal.  Neck: Supple, no JVD, left  carotid bruits, or masses. Cardiac: RRR, AS murmurs, rubs, or gallops. No clubbing, cyanosis, 2-3/6 harsh systolic murmur of AS.  Radials/DP/PT 2+ and equal bilaterally.  Respiratory:  Respirations regular and unlabored, clear  to auscultation bilaterally. GI: Soft, nontender, nondistended, BS + x 4. MS: no deformity or atrophy. Skin: warm and dry, no rash. Neuro:  Strength and sensation are intact. Psych: AAOx3.  Normal affect.  Labs    CBC  Recent Labs  05/01/16 0437 05/02/16 0432  WBC 9.5 8.2  HGB 13.4 13.0  HCT 41.6 39.9  MCV 94.5 93.9  PLT 290 262   Basic Metabolic Panel  Recent Labs  05/01/16 0437 05/02/16 0432  NA 141 137  K 3.7 3.4*  CL 104 103  CO2 26 27  GLUCOSE 107* 107*  BUN 6 8  CREATININE 0.88 0.94  CALCIUM 8.6* 8.5*   Liver Function Tests No results for input(s): AST, ALT, ALKPHOS, BILITOT, PROT, ALBUMIN in the last 72 hours. No results for input(s): LIPASE, AMYLASE in the last 72 hours. Cardiac Enzymes  Recent Labs  04/29/16 2325 04/30/16 0547 04/30/16 1104  TROPONINI <0.03 <0.03 <0.03   BNP Invalid input(s): POCBNP D-Dimer No results for input(s): DDIMER in the last 72  hours. Hemoglobin A1C No results for input(s): HGBA1C in the last 72 hours. Fasting Lipid Panel No results for input(s): CHOL, HDL, LDLCALC, TRIG, CHOLHDL, LDLDIRECT in the last 72 hours. Thyroid Function Tests No results for input(s): TSH, T4TOTAL, T3FREE, THYROIDAB in the last 72 hours.  Invalid input(s): FREET3  Telemetry    NSR - Personally Reviewed  ECG    NSR. LAD - Personally Reviewed  Radiology    Dg Orthopantogram  Result Date: 05/01/2016 CLINICAL DATA:  Preoperative dental evaluation EXAM: ORTHOPANTOGRAM/PANORAMIC COMPARISON:  CT 04/28/2016 FINDINGS: Residual dentition shows possible decay at teeth 7 and 10. Mandibular dentition shows a small radicular cyst at tooth 29. IMPRESSION: Possible advanced decay teeth 7 and 10. Small radicular cyst tooth 29. Electronically Signed   By: Paulina FusiMark  Shogry M.D.   On: 05/01/2016 13:01    Cardiac Studies   2D ECHO 04/29/2016 LV EF: 55% -   60% Study Conclusions - Left ventricle: The cavity size was normal. Wall thickness was   increased in a pattern of moderate LVH. Systolic function was   normal. The estimated ejection fraction was in the range of 55%   to 60%. Wall motion was normal; there were no regional wall   motion abnormalities. Doppler parameters are consistent with   abnormal left ventricular relaxation (grade 1 diastolic   dysfunction). Doppler parameters are consistent with high   ventricular filling pressure. - Aortic valve: There was severe stenosis. There was trivial   regurgitation. - Left atrium: The atrium was moderately dilated. - Pulmonary arteries: Systolic pressure was mildly increased. PA   peak pressure: 42 mm Hg (S). Impressions: - Technically difficult; definity used; normal LV systolic   function; grade 1 diastolic dysfunction with elevated LV filling   pressure; moderate LVH; calcified aortic valve with severe AS;   moderate LAE; mildly elevated pulmonary pressure.  Patient Profile     Mr.  [name] is a 71 year old male with a past medical history of PAF (on Eliquis), carotid artery disease s/p L CEA (2014), HTN, HLD, obesity, OSA, moderate AS, and CVA who [facility] 04/28/16 with syncope.   Assessment & Plan    1. Syncope/NSTEMI: patient has had 2 syncopal episodes in the past 2 months. Both of which sound like true cardiac syncope. First Troponin I elevated to 0.15, but subsequent troponin negative. ECHO this admission showed progression of AS from moderate to severe. Cath with no CAD  and mean gradient 33.7 by [place]   Trying to work with PT but unable to sit up in bed yesterday    2. Severe AS: repeat echo this admission shows severe AS.  Mean gradient 41 mmHg and peak 70 mmHg Seen by Dr Laneta Simmers and thought to be a better TAVR candidate. Getting dental work today and hopefully CTA Although cast on left arm may prevent him from getting in scanner with arms over head  3. History of CVA: continue statin   4. Carotid artery stenosis:  S/p L CEA in 2014.  Follows with Dr. Darrick Penna. Last seen in Feb. 2016, duplex showed right ICA stenosis of 60-79%.  . 5. HTN: BPs have been low. Treated with IVFs and holding lasix.   6. Ortho:  Would have ortho here see not clear why cast has been on so long with no f/u  ? F/u xrays    Charlton Haws

## 2016-05-02 NOTE — Progress Notes (Addendum)
Patient is postop tooth extraction of 6 teeth. Patient stable. Oral cavity appears within normal limits. Only pain is a headache. Tylenol given. Patient with no further complaints.  IV [facility]  Amicar unable to be given due to medication not being available. Pharmacy notified.

## 2016-05-03 ENCOUNTER — Inpatient Hospital Stay (HOSPITAL_COMMUNITY): Payer: Medicare Other

## 2016-05-03 ENCOUNTER — Encounter (HOSPITAL_COMMUNITY): Payer: Self-pay | Admitting: Dentistry

## 2016-05-03 DIAGNOSIS — I350 Nonrheumatic aortic (valve) stenosis: Secondary | ICD-10-CM

## 2016-05-03 LAB — BASIC METABOLIC PANEL
ANION GAP: 8 (ref 5–15)
BUN: 10 mg/dL (ref 6–20)
CHLORIDE: 103 mmol/L (ref 101–111)
CO2: 26 mmol/L (ref 22–32)
Calcium: 8.7 mg/dL — ABNORMAL LOW (ref 8.9–10.3)
Creatinine, Ser: 0.87 mg/dL (ref 0.61–1.24)
GFR calc Af Amer: >60 mL/min (ref >60)
GLUCOSE: 139 mg/dL — AB (ref 65–99)
POTASSIUM: 3.9 mmol/L (ref 3.5–5.1)
Sodium: 137 mmol/L (ref 135–145)

## 2016-05-03 LAB — CBC
HEMATOCRIT: 41.1 % (ref 39.0–52.0)
HEMOGLOBIN: 13.4 g/dL (ref 13.0–17.0)
MCH: 30.3 pg (ref 26.0–34.0)
MCHC: 32.6 g/dL (ref 30.0–36.0)
MCV: 93 fL (ref 78.0–100.0)
Platelets: 303 10*3/uL (ref 150–400)
RBC: 4.42 MIL/uL (ref 4.22–5.81)
RDW: 14.1 % (ref 11.5–15.5)
WBC: 12.9 10*3/uL — AB (ref 4.0–10.5)

## 2016-05-03 LAB — HEPARIN LEVEL (UNFRACTIONATED)
HEPARIN UNFRACTIONATED: 0.45 [IU]/mL (ref 0.30–0.70)
Heparin Unfractionated: 0.27 IU/mL — ABNORMAL LOW (ref 0.30–0.70)

## 2016-05-03 LAB — GLUCOSE, CAPILLARY
GLUCOSE-CAPILLARY: 135 mg/dL — AB (ref 65–99)
GLUCOSE-CAPILLARY: 140 mg/dL — AB (ref 65–99)

## 2016-05-03 LAB — APTT: aPTT: 37 seconds — ABNORMAL HIGH (ref 24–36)

## 2016-05-03 MED ORDER — BISACODYL 10 MG RE SUPP
10.0000 mg | Freq: Every day | RECTAL | Status: DC | PRN
  Administered 2016-05-03: 10 mg via RECTAL
  Filled 2016-05-03: qty 1

## 2016-05-03 MED ORDER — HEPARIN (PORCINE) IN NACL 100-0.45 UNIT/ML-% IJ SOLN
1800.0000 [IU]/h | INTRAMUSCULAR | Status: DC
  Administered 2016-05-03 – 2016-05-04 (×2): 2000 [IU]/h via INTRAVENOUS
  Administered 2016-05-04 – 2016-05-05 (×3): 1800 [IU]/h via INTRAVENOUS
  Filled 2016-05-03 (×5): qty 250

## 2016-05-03 MED ORDER — METOPROLOL TARTRATE 25 MG PO TABS
25.0000 mg | ORAL_TABLET | Freq: Two times a day (BID) | ORAL | Status: DC
  Administered 2016-05-03 – 2016-05-09 (×8): 25 mg via ORAL
  Filled 2016-05-03 (×9): qty 1

## 2016-05-03 MED ORDER — IOPAMIDOL (ISOVUE-370) INJECTION 76%
INTRAVENOUS | Status: AC
  Administered 2016-05-03: 80 mL
  Filled 2016-05-03: qty 100

## 2016-05-03 MED ORDER — IOPAMIDOL (ISOVUE-370) INJECTION 76%
INTRAVENOUS | Status: AC
  Administered 2016-05-03: 70 mL
  Filled 2016-05-03: qty 50

## 2016-05-03 NOTE — Progress Notes (Signed)
ANTICOAGULATION CONSULT NOTE  Pharmacy Consult for Heparin Indication: chest pain/ACS  Allergies  Allergen Reactions  . Coumadin [Warfarin Sodium] Rash    Patient Measurements: Height: 6\' 2"  (188 cm) Weight: (!) 311 lb 1.6 oz (141.1 kg) IBW/kg (Calculated) : 82.2 Heparin Dosing Weight: 115.4 kg  Vital Signs: Temp: 98.4 F (36.9 C) (01/27 1139) Temp Source: Oral (01/27 1139) BP: 100/47 (01/27 1800) Pulse Rate: 73 (01/27 1800)  Labs:  Recent Labs  05/01/16 0437  05/01/16 2014 05/02/16 0432 05/03/16 0413 05/03/16 1758  HGB 13.4  --   --  13.0 13.4  --   HCT 41.6  --   --  39.9 41.1  --   PLT 290  --   --  262 303  --   APTT  --   --  63* 33 37*  --   HEPARINUNFRC  --   < > 0.60 0.32 0.27* 0.45  CREATININE 0.88  --   --  0.94 0.87  --   < > = values in this interval not displayed.  Estimated Creatinine Clearance: 118.2 mL/min (by C-G formula based on SCr of 0.87 mg/dL).   Medical History: Past Medical History:  Diagnosis Date  . Atrial fibrillation (HCC)    pt on Eliquis  . Chest pain   . CHF (congestive heart failure) (HCC)   . Edema   . HTN (hypertension)   . Hyperlipidemia   . Neuropathy (HCC)   . Obesity   . SOB (shortness of breath)   . Stroke [facility])     Medications:  Prescriptions Prior to Admission  Medication Sig Dispense Refill Last Dose  . ALPRAZolam (XANAX) 0.5 MG tablet Take 0.5 mg by mouth at bedtime.    04/27/2016 at Unknown time  . apixaban (ELIQUIS) 5 MG TABS tablet Take 1 tablet (5 mg total) by mouth 2 (two) times daily. 60 tablet 0 04/27/2016 at 2200  . atorvastatin (LIPITOR) 40 MG tablet Take 1 tablet (40 mg total) by mouth daily at 6 PM. 30 tablet 0 04/27/2016 at Unknown time  . buPROPion (WELLBUTRIN SR) 150 MG 12 hr tablet Take 450 mg by mouth daily.    04/27/2016 at Unknown time  . furosemide (LASIX) 40 MG tablet Take 40 mg by mouth daily.   04/27/2016 at Unknown time  . gabapentin (NEURONTIN) 800 MG tablet Take 800 mg by mouth 3 (three)  times daily.    04/27/2016 at Unknown time  . hydrOXYzine (ATARAX/VISTARIL) 25 MG tablet Take 25 mg by mouth 3 (three) times daily as needed.   Past Month at Unknown time  . metoprolol tartrate (LOPRESSOR) 25 MG tablet Take 0.5 tablets (12.5 mg total) by mouth 2 (two) times daily. (Patient taking differently: Take 25 mg by mouth 2 (two) times daily. ) 30 tablet 0 04/27/2016 at 2200  . oxycodone (OXY-IR) 5 MG capsule Take 5 mg by mouth every 4 (four) hours as needed for pain.    04/27/2016 at Unknown time  . sertraline (ZOLOFT) 100 MG tablet Take 150 mg by mouth daily.    04/27/2016 at Unknown time  . traMADol (ULTRAM) 50 MG tablet Take 50 mg by mouth every 6 (six) hours as needed for moderate pain.    Past Month at Unknown time   Scheduled:  . ALPRAZolam  0.5 mg Oral QHS  . atorvastatin  40 mg Oral q1800  . buPROPion  450 mg Oral Daily  . Chlorhexidine Gluconate Cloth  6 each Topical Daily  . feeding  supplement  1 Container Oral TID WC  . feeding supplement (PRO-STAT SUGAR FREE 64)  30 mL Oral TID BM  . gabapentin  800 mg Oral TID  . mouth rinse  15 mL Mouth Rinse BID  . metoprolol tartrate  25 mg Oral BID  . multivitamin with minerals  1 tablet Oral QHS  . mupirocin ointment  1 application Nasal BID  . sertraline  150 mg Oral Daily  . sodium chloride flush  3 mL Intravenous Q12H  . sodium chloride flush  3 mL Intravenous Q12H   Infusions:  . heparin 2,000 Units/hr (05/03/16 1229)  . lactated ringers 10 mL/hr at 05/02/16 2207  . lactated ringers      Assessment: 71 yo male with history of Afib on eliquis PTA presents with syncope. Pharmacy consulted to dose heparin for ACS/chest pain, cath showed non-obstructive CAD and severe aortic stenosis. Plan is likely TAVR 05/06/16.   Heparin was discontinued for dental surgery on 1/26 and never resumed although DDS notes that it could be resumed. Per Dr. Catha GosselinMikhail, resume heparin today (1/27). Last dose of Eliquis was 1/22 and pt previously had  therapeutic heparin levels x2 on 2000 units/hr.   Initial lvl 0.45  Goal of Therapy:  Heparin level 0.3-0.7 units/ml aPTT 66-102 seconds Monitor platelets by anticoagulation protocol: Yes   Plan:  -Continue heparin 2000 units/hr -Monitor daily heparin level, S/Sx bleeding, CBC -F/U cardiology plans  Isaac BlissMichael Kedra Mcglade, PharmD, BCPS, BCCCP Clinical Pharmacist 05/03/2016 7:16 PM

## 2016-05-03 NOTE — Progress Notes (Signed)
ANTICOAGULATION CONSULT NOTE  Pharmacy Consult for Heparin Indication: chest pain/ACS  Allergies  Allergen Reactions  . Coumadin [Warfarin Sodium] Rash    Patient Measurements: Height: 6\' 2"  (188 cm) Weight: (!) 311 lb 1.6 oz (141.1 kg) IBW/kg (Calculated) : 82.2 Heparin Dosing Weight: 115.4 kg  Vital Signs: Temp: 97.7 F (36.5 C) (01/27 0532) Temp Source: Oral (01/27 0532) BP: 99/53 (01/27 1027) Pulse Rate: 66 (01/27 1027)  Labs:  Recent Labs  05/01/16 0437 05/01/16 2014 05/02/16 0432 05/03/16 0413  HGB 13.4  --  13.0 13.4  HCT 41.6  --  39.9 41.1  PLT 290  --  262 303  APTT  --  63* 33 37*  HEPARINUNFRC  --  0.60 0.32 0.27*  CREATININE 0.88  --  0.94 0.87    Estimated Creatinine Clearance: 118.2 mL/min (by C-G formula based on SCr of 0.87 mg/dL).   Medical History: Past Medical History:  Diagnosis Date  . Atrial fibrillation (HCC)    pt on Eliquis  . Chest pain   . CHF (congestive heart failure) (HCC)   . Edema   . HTN (hypertension)   . Hyperlipidemia   . Neuropathy (HCC)   . Obesity   . SOB (shortness of breath)   . Stroke [facility](HCC)     Medications:  Prescriptions Prior to Admission  Medication Sig Dispense Refill Last Dose  . ALPRAZolam (XANAX) 0.5 MG tablet Take 0.5 mg by mouth at bedtime.    04/27/2016 at Unknown time  . apixaban (ELIQUIS) 5 MG TABS tablet Take 1 tablet (5 mg total) by mouth 2 (two) times daily. 60 tablet 0 04/27/2016 at 2200  . atorvastatin (LIPITOR) 40 MG tablet Take 1 tablet (40 mg total) by mouth daily at 6 PM. 30 tablet 0 04/27/2016 at Unknown time  . buPROPion (WELLBUTRIN SR) 150 MG 12 hr tablet Take 450 mg by mouth daily.    04/27/2016 at Unknown time  . furosemide (LASIX) 40 MG tablet Take 40 mg by mouth daily.   04/27/2016 at Unknown time  . gabapentin (NEURONTIN) 800 MG tablet Take 800 mg by mouth 3 (three) times daily.    04/27/2016 at Unknown time  . hydrOXYzine (ATARAX/VISTARIL) 25 MG tablet Take 25 mg by mouth 3 (three)  times daily as needed.   Past Month at Unknown time  . metoprolol tartrate (LOPRESSOR) 25 MG tablet Take 0.5 tablets (12.5 mg total) by mouth 2 (two) times daily. (Patient taking differently: Take 25 mg by mouth 2 (two) times daily. ) 30 tablet 0 04/27/2016 at 2200  . oxycodone (OXY-IR) 5 MG capsule Take 5 mg by mouth every 4 (four) hours as needed for pain.    04/27/2016 at Unknown time  . sertraline (ZOLOFT) 100 MG tablet Take 150 mg by mouth daily.    04/27/2016 at Unknown time  . traMADol (ULTRAM) 50 MG tablet Take 50 mg by mouth every 6 (six) hours as needed for moderate pain.    Past Month at Unknown time   Scheduled:  . ALPRAZolam  0.5 mg Oral QHS  . atorvastatin  40 mg Oral q1800  . buPROPion  450 mg Oral Daily  . Chlorhexidine Gluconate Cloth  6 each Topical Daily  . feeding supplement  1 Container Oral TID WC  . feeding supplement (PRO-STAT SUGAR FREE 64)  30 mL Oral TID BM  . gabapentin  800 mg Oral TID  . mouth rinse  15 mL Mouth Rinse BID  . multivitamin with minerals  1 tablet Oral QHS  . mupirocin ointment  1 application Nasal BID  . sertraline  150 mg Oral Daily  . sodium chloride flush  3 mL Intravenous Q12H  . sodium chloride flush  3 mL Intravenous Q12H   Infusions:  . diltiazem (CARDIZEM) infusion 5 mg/hr (05/02/16 2104)  . lactated ringers 10 mL/hr at 05/02/16 2207  . lactated ringers      Assessment: 71 yo male with history of Afib on eliquis PTA presents with syncope. Pharmacy consulted to dose heparin for ACS/chest pain, cath showed non-obstructive CAD and severe aortic stenosis. Plan is likely TAVR 05/06/16.   Heparin was discontinued for dental surgery on 1/26 and never resumed although DDS notes that it could be resumed. Per Dr. Catha Gosselin, resume heparin today (1/27). Last dose of Eliquis was 1/22 and pt previously had therapeutic heparin levels x2 on 2000 units/hr.   Goal of Therapy:  Heparin level 0.3-0.7 units/ml aPTT 66-102 seconds Monitor platelets by  anticoagulation protocol: Yes   Plan:  -Resume heparin at previously therapeutic rate of 2000 units/hr -Follow 6-hr heparin level -Monitor daily heparin level, S/Sx bleeding, CBC -F/U cardiology plans  Fredonia Highland, PharmD PGY-1 Pharmacy Resident Pager: [phone] 05/03/2016

## 2016-05-03 NOTE — Progress Notes (Signed)
Subjective:  Mild shortness of breath and some mild pain involving his shoulder and arm.  He did have his dental work completed yesterday without problems.  CT scan was able to be done yesterday but has not been read yet.  No severe shortness of breath.  Objective:  Vital Signs in the last 24 hours: BP (!) 93/53 (BP Location: Right Arm)   Pulse 67   Temp 98.4 F (36.9 C) (Oral)   Resp 18   Ht 6\' 2"  (1.88 m)   Wt (!) 141.1 kg (311 lb 1.6 oz)   SpO2 94%   BMI 39.94 kg/m   Physical Exam: Severely obese white male in no acute distress Lungs:  Rhonchi present bilaterally  Cardiac:  Regular rhythm, normal S1 and S2, no S3, harsh 2/6 systolic murmur at aortic area and down the left sternal border Abdomen:  Severely obese soft and nontender without mass Extremities:  trace edema present  Intake/Output from previous day: 01/26 0701 - 01/27 0700 In: 1933.8 [P.O.:600; I.V.:1018.8; IV Piggyback:315] Out: 2275 [Urine:2225; Blood:50] Weight Filed Weights   05/01/16 0539 05/02/16 0623 05/03/16 0532  Weight: (!) 141.9 kg (312 lb 14.4 oz) (!) 142.2 kg (313 lb 6.4 oz) (!) 141.1 kg (311 lb 1.6 oz)    Lab Results: Basic Metabolic Panel:  Recent Labs  40/98/1101/26/18 0432 05/03/16 0413  NA 137 137  K 3.4* 3.9  CL 103 103  CO2 27 26  GLUCOSE 107* 139*  BUN 8 10  CREATININE 0.94 0.87    CBC:  Recent Labs  05/02/16 0432 05/03/16 0413  WBC 8.2 12.9*  HGB 13.0 13.4  HCT 39.9 41.1  MCV 93.9 93.0  PLT 262 303    BNP    Component Value Date/Time   BNP 54.4 04/29/2016 0856    Telemetry: Sinus rhythm, he has some episodes of paroxysmal atrial fibrillation noted with rapid response but currently is in sinus  Personally reviewed  Assessment/Plan:  1.  Severe aortic stenosis with syncope-currently being evaluated for TAVR-workup is in progress and procedure tentatively planned for Tuesday 2.  Paroxysmal atrial fibrillation 3.  Prior stroke 4.  History of left carotid artery  dissection  Recommendations:  Appears clinically well still with some paroxysmal atrial fibrillation.  Workup in progress and awaiting results of CT read for TAVR.     Darden PalmerW. Spencer Juron Vorhees, Jr.  MD [facility]ACC Cardiology  05/03/2016, 11:54 AM

## 2016-05-03 NOTE — Progress Notes (Signed)
HR noted to be 57 on monitor.  RN turned off Cardizem drip as per order (to stop if HR <60) and notified cards on all.  Immediately returned call and did give order to stop Cardizem drip for now.  Will continue to monitor heart rate and notify provider for elevations.

## 2016-05-03 NOTE — Progress Notes (Signed)
Patient stable this 7 a to 7 p shift, no c/o chest pain or shortness of breath.  HR in 70's to low 80's since discontinuing Cardizem drip.BP continues to be soft latest 100/40's.   Heparin restarted this shift as per order.  Ortho tech did place sling today for patients comfort left arm.  Patient sitting up in chair visiting with family at this time.

## 2016-05-03 NOTE — Progress Notes (Signed)
PROGRESS NOTE    Jorge [name]  ZOX:[id number] DOB: 02-07-1946 DOA: 04/28/2016 PCP: Jorge Peak, PA-C   Chief Complaint  Patient presents with  . Loss of Consciousness    Brief Narrative:  HPI on 04/28/2016 by Ms. Jorge Kays, PA Jorge [name] is a 71 y.o. male with a history of paroxysmal atrial fibrillation on Eliquis , DCHF,  AS,  hypertension, hyperlipidemia, obey CT, prior history of stroke, carotid artery disease, brought to the ED after a syncopal episode. In review, she was shaving around 8:30 this morning, when he turned his head to the right, accompanied by some prodromal symptoms including nausea, and "shinning lights:, falling an hitting his head against the bathtub. He had lost consciousness for several minutes, but remained on the floor till 11 am, when he was found by his caretaker and EMS was called. Patient reported increased anxiety a the time, and complains of vague chest pain, without palpitations. He reports increasing shortness of breath without cough. He denies any jaw pain or left arm pain. He reports having similar dizzy spells over the last year but no presyncopal or syncopal events. No dysarthria or dysphagia. Denies any unilateral weakness or sensory deficiencies.  Denies any confusion. Of note, patient has trouble finding words since his last stroke. No seizures reported.  Denies any fever or chills, or night sweats. Denies any abdominal pain, or diarrhea. Denies any sick contacts or new foods. Denies any recent long distance trips. No recent surgeries. Denies worsening lower extremity swelling. Denies abnormal skin rashes, or neuropathy. Compliant with  medications. Does not smoke.  Assessment & Plan   Hypotension -possibly one cause of patient's syncope (along with aortic stenosis) -Unclear etiology for hypotension.  -upon admission denied fever, cough, chest pain, UR symptoms, no abdominal pain. Had some diarrhea few days ago which has resolved.    -Cardiology consulted and appreciated -Echocardiogram 04/29/2016: EF 55-60%, grade 1 diastolic dysfunction. Severe AS -Cefepime started empirically given that patient also had leukocytosis- discontinued 05/01/2016  Syncope  -D-Dimer 3.28, EKG unrevealing.  -CT head negative for acute intracranial abnormalities. -CT angio chest negative for central PE.  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  Widely patent vertebral arteries within the neck. There is a irregular curvilinear filling defect within the proximal left ICA, likely a small dissection flap (series 7, image 210). -Due to abnormal CT angio neck, vascular surgery consulted and appreciated  Severe aortic stenosis -Possibly cause of patient's syncope. -Noted on Echocardiogram and cardiac cath -CT surgery consulted and appreciated -CT chest/abd/pelvis pending, not sure if this can be done given his left humeral fracture  ?Left ICA dissection -CTA neck as above -Vascular surgery consulted and did not recommend further acute intervention at this time. Patient already on anticoagulation  -s/p L CEA 2014- follows with Dr. Darrick [name]   Dental caries -Dentistry consulted, s/p multiple tooth extractions 1/26  Elevated troponin/NSTEMI -Echo as above -R/LHC: mild non-obstructive CAD, severe aortic stenosis (mean gradient 33.30mmHg)  Hypertension  -Hold BP medications as patient was hypotensive.   Paroxysmal Atrial Fibrillation  -CHA2DS2-VASc score 6  -Continue anticoagulation, currently Eliquis held due to Cath.   -Was placed on heparin- but was discontinued yesterday after cath. -Given current Afib, heparin held for dental surgery  Hyperlipidemia -Continue statin  Peripheral Neuropathy -Continue Neurontin  Depression -Continue home Zoloft   Generalized weakness -PT and OT consulted- rec SNF  Constipation -continue bowel regimen -Suppository added  Left humeral fracture -present on admission. Secondary to fall -Cast was placed on 12/20- patient sees Dr. Loralie [name] at [facility] ortho -Called the office on 1/26, cast is supposed to removed on 05/20/16 -Will obtain left arm xrays today and discuss with ortho on call   DVT Prophylaxis  Heparin (on hold)  Code Status: Full  Family Communication: none at bedside  Disposition Plan: Admitted.   Consultants Cardiology Vascular surgery  Cardiothoracic surgery  Procedures  Echocardiogram R/LHC  Antibiotics   Anti-infectives    Start     Dose/Rate Route Frequency Ordered Stop   05/02/16 1015  ceFAZolin (ANCEF) 3 g in dextrose 5 % 50 mL IVPB     3 g 130 mL/hr over 30 Minutes Intravenous  Once 05/01/16 1602 05/02/16 1137   04/29/16 2200  vancomycin (VANCOCIN) IVPB 750 mg/150 ml premix  Status:  Discontinued     750 mg 150 mL/hr over 60 Minutes Intravenous Every 12 hours 04/29/16 0939 05/01/16 1021   04/29/16 0930  ceFEPIme (MAXIPIME) 2 g in dextrose 5 % 50 mL IVPB  Status:  Discontinued     2 g 100 mL/hr over 30 Minutes Intravenous Every 12 hours 04/29/16 0859 05/01/16 1021   04/29/16 0930  vancomycin (VANCOCIN) IVPB 1000 mg/200 mL premix     1,000 mg 200 mL/hr over 60 Minutes Intravenous  Once 04/29/16 0859 04/29/16 1143      Subjective:   Jorge [name] seen and examined today. Complains of feeling weak. Does not complain of mouth pain.  Has constipation.  Denies chest pain, shortness of breath, abdominal pain, nausea or vomiting, diarrhea.   Objective:   Vitals:   05/02/16 1345 05/02/16 1945 05/03/16 0532 05/03/16 1027  BP: 110/88 (!) 101/49 107/84 (!) 99/53  Pulse: 73 78 99 66  Resp: 18 18 18    Temp:  99.1 F (37.3 C) 97.7 F (36.5 C)   TempSrc:  Oral Oral   SpO2: 98% 93% 97% 97%  Weight:   (!) 141.1 kg (311 lb  1.6 oz)   Height:        Intake/Output Summary (Last 24 hours) at 05/03/16 1106 Last data filed at 05/03/16 0831  Gross per 24 hour  Intake          2153.83 ml  Output             2875 ml  Net          -721.17 ml   Filed Weights   05/01/16 0539 05/02/16 0623 05/03/16 0532  Weight: (!) 141.9 kg (312 lb 14.4 oz) (!) 142.2 kg (313 lb 6.4 oz) (!) 141.1 kg (311 lb 1.6 oz)    Exam  General: Well developed, well nourished, NAD, appears stated age  HEENT: NCAT,mucous membranes moist.   Neck: Supple, no JVD, no masses. Left carotid bruit  Cardiovascular: S1 S2 auscultated,3/6SEM-harsh, irregular  Respiratory: Clear to auscultation bilaterally with equal chest rise  Abdomen: Soft, obese, nontender, nondistended, + bowel sounds  Extremities: warm dry without cyanosis clubbing or edema. LUE in cast  Neuro: AAOx3, nonfocal  Psych: Normal affect and demeanor, pleasant    Data Reviewed: I have personally reviewed following labs and imaging studies  CBC:  Recent Labs Lab 04/28/16 1249 04/29/16 0856 05/01/16 0437 05/02/16 0432 05/03/16 0413  WBC 14.2* 9.7 9.5 8.2 12.9*  HGB 13.7 12.8* 13.4 13.0 13.4  HCT 42.8 40.1 41.6 39.9 41.1  MCV 94.5 95.0 94.5 93.9 93.0  PLT 306 309 290 262 303   Basic Metabolic Panel:  Recent Labs Lab 04/28/16 1209 04/29/16 1145 05/01/16 0437 05/02/16 0432 05/03/16 0413  NA 138 138 141 137 137  K 3.7 3.7 3.7 3.4* 3.9  CL 103 107 104 103 103  CO2 26 24 26 27 26   GLUCOSE 132* 141* 107* 107* 139*  BUN 11 11 6 8 10   CREATININE 0.99 1.01 0.88 0.94 0.87  CALCIUM 8.9 8.2* 8.6* 8.5* 8.7*   GFR: Estimated Creatinine Clearance: 118.2 mL/min (by C-G formula based on SCr of 0.87 mg/dL). Liver Function Tests:  Recent Labs Lab 04/28/16 1209  AST 21  ALT 17  ALKPHOS 219*  BILITOT 0.5  PROT 7.3  ALBUMIN 3.8   No results for input(s): LIPASE, AMYLASE in the last 168 hours. No results for input(s): AMMONIA in the last 168 hours. Coagulation  Profile: No results for input(s): INR, PROTIME in the last 168 hours. Cardiac Enzymes:  Recent Labs Lab 04/28/16 1249  04/29/16 1145 04/29/16 1653 04/29/16 2325 04/30/16 0547 04/30/16 1104  CKTOTAL 148  --   --   --   --   --   --   TROPONINI  --   < > <0.03 <0.03 <0.03 <0.03 <0.03  < > = values in this interval not displayed. BNP (last 3 results) No results for input(s): PROBNP in the last 8760 hours. HbA1C: No results for input(s): HGBA1C in the last 72 hours. CBG:  Recent Labs Lab 05/01/16 1544 05/02/16 0624 05/02/16 2354 05/03/16 0531 05/03/16 0614  GLUCAP 160* 148* 133* 140* 135*   Lipid Profile: No results for input(s): CHOL, HDL, LDLCALC, TRIG, CHOLHDL, LDLDIRECT in the last 72 hours. Thyroid Function Tests: No results for input(s): TSH, T4TOTAL, FREET4, T3FREE, THYROIDAB in the last 72 hours. Anemia Panel: No results for input(s): VITAMINB12, FOLATE, FERRITIN, TIBC, IRON, RETICCTPCT in the last 72 hours. Urine analysis:    Component Value Date/Time   COLORURINE YELLOW 04/29/2016 1233   APPEARANCEUR CLEAR 04/29/2016 1233   LABSPEC 1.021 04/29/2016 1233   PHURINE 6.0 04/29/2016 1233   GLUCOSEU NEGATIVE 04/29/2016 1233   HGBUR SMALL (A) 04/29/2016 1233   BILIRUBINUR NEGATIVE 04/29/2016 1233   KETONESUR NEGATIVE 04/29/2016 1233   PROTEINUR NEGATIVE 04/29/2016 1233   NITRITE NEGATIVE 04/29/2016 1233   LEUKOCYTESUR NEGATIVE 04/29/2016 1233   Sepsis Labs: @LABRCNTIP (procalcitonin:4,lacticidven:4)  ) Recent Results (from the past 240 hour(s))  Culture, blood (routine x 2)     Status: None (Preliminary result)   Collection Time: 04/29/16  9:20 AM  Result Value Ref Range Status   Specimen Description BLOOD RIGHT ARM  Final   Special Requests IN PEDIATRIC BOTTLE  2CC  Final   Culture NO GROWTH 4 DAYS  Final   Report Status PENDING  Incomplete  Culture, blood (routine x 2)     Status: None (Preliminary result)   Collection Time: 04/29/16  9:27 AM  Result  Value Ref Range Status   Specimen Description BLOOD RIGHT HAND  Final   Special Requests IN PEDIATRIC BOTTLE  3 CC  Final   Culture NO GROWTH 4 DAYS  Final   Report Status PENDING  Incomplete  Urine culture     Status: None   Collection Time: 04/29/16 12:33 PM  Result Value Ref Range Status   Specimen Description URINE, CLEAN CATCH  Final   Special Requests NONE  Final   Culture NO  GROWTH  Final   Report Status 04/30/2016 FINAL  Final  Surgical pcr screen     Status: Abnormal   Collection Time: 05/01/16  7:47 PM  Result Value Ref Range Status   MRSA, PCR NEGATIVE NEGATIVE Final   Staphylococcus aureus POSITIVE (A) NEGATIVE Final    Comment:        The Xpert SA Assay (FDA approved for NASAL specimens in patients over 71 years of age), is one component of a comprehensive surveillance program.  Test performance has been validated by [facility] for patients greater than or equal to 71 year old. It is not intended to diagnose infection nor to guide or monitor treatment.       Radiology Studies: Dg Orthopantogram  Result Date: 05/01/2016 CLINICAL DATA:  Preoperative dental evaluation EXAM: ORTHOPANTOGRAM/PANORAMIC COMPARISON:  CT 04/28/2016 FINDINGS: Residual dentition shows possible decay at teeth 7 and 10. Mandibular dentition shows a small radicular cyst at tooth 29. IMPRESSION: Possible advanced decay teeth 7 and 10. Small radicular cyst tooth 29. Electronically Signed   By: Paulina FusiMark  Shogry M.D.   On: 05/01/2016 13:01     Scheduled Meds: . ALPRAZolam  0.5 mg Oral QHS  . atorvastatin  40 mg Oral q1800  . buPROPion  450 mg Oral Daily  . Chlorhexidine Gluconate Cloth  6 each Topical Daily  . feeding supplement  1 Container Oral TID WC  . feeding supplement (PRO-STAT SUGAR FREE 64)  30 mL Oral TID BM  . gabapentin  800 mg Oral TID  . mouth rinse  15 mL Mouth Rinse BID  . multivitamin with minerals  1 tablet Oral QHS  . mupirocin ointment  1 application Nasal BID  .  sertraline  150 mg Oral Daily  . sodium chloride flush  3 mL Intravenous Q12H  . sodium chloride flush  3 mL Intravenous Q12H   Continuous Infusions: . diltiazem (CARDIZEM) infusion 5 mg/hr (05/02/16 2104)  . lactated ringers 10 mL/hr at 05/02/16 2207  . lactated ringers       LOS: 4 days   Time Spent in minutes   30 minutes  Donella Pascarella D.O. on 05/03/2016 at 11:06 AM  Between 7am to 7pm - Pager - [phone]  After 7pm go to [email or web address] - password [email or web address]  And look for the night coverage person covering for me after hours  [facility] Office  [phone]

## 2016-05-04 LAB — CBC
HEMATOCRIT: 41 % (ref 39.0–52.0)
HEMOGLOBIN: 13.2 g/dL (ref 13.0–17.0)
MCH: 30.5 pg (ref 26.0–34.0)
MCHC: 32.2 g/dL (ref 30.0–36.0)
MCV: 94.7 fL (ref 78.0–100.0)
Platelets: 253 10*3/uL (ref 150–400)
RBC: 4.33 MIL/uL (ref 4.22–5.81)
RDW: 14.4 % (ref 11.5–15.5)
WBC: 10.4 10*3/uL (ref 4.0–10.5)

## 2016-05-04 LAB — CULTURE, BLOOD (ROUTINE X 2)
Culture: NO GROWTH
Culture: NO GROWTH

## 2016-05-04 LAB — BASIC METABOLIC PANEL
Anion gap: 8 (ref 5–15)
BUN: 16 mg/dL (ref 6–20)
CHLORIDE: 103 mmol/L (ref 101–111)
CO2: 28 mmol/L (ref 22–32)
CREATININE: 0.89 mg/dL (ref 0.61–1.24)
Calcium: 8.7 mg/dL — ABNORMAL LOW (ref 8.9–10.3)
GFR calc Af Amer: >60 mL/min (ref >60)
GFR calc non Af Amer: >60 mL/min (ref >60)
GLUCOSE: 104 mg/dL — AB (ref 65–99)
Potassium: 3.8 mmol/L (ref 3.5–5.1)
Sodium: 139 mmol/L (ref 135–145)

## 2016-05-04 LAB — HEPARIN LEVEL (UNFRACTIONATED)
Heparin Unfractionated: 0.69 IU/mL (ref 0.30–0.70)
Heparin Unfractionated: 0.5 IU/mL (ref 0.30–0.70)

## 2016-05-04 MED ORDER — AMIODARONE HCL 200 MG PO TABS
200.0000 mg | ORAL_TABLET | Freq: Two times a day (BID) | ORAL | Status: DC
  Administered 2016-05-04 – 2016-05-07 (×7): 200 mg via ORAL
  Filled 2016-05-04 (×7): qty 1

## 2016-05-04 NOTE — Progress Notes (Signed)
PROGRESS NOTE    Jorge RADERMACHER  [name]:[id number] DOB: 25-Feb-1946 DOA: 04/28/2016 PCP: [name], PA-C   Chief Complaint  Patient presents with  . Loss of Consciousness    Brief Narrative:  HPI on 04/28/2016 by Ms. [name], PA Jorge [name] is a 71 y.o. male with a history of paroxysmal atrial fibrillation on Eliquis , DCHF,  AS,  hypertension, hyperlipidemia, obey CT, prior history of stroke, carotid artery disease, brought to the ED after a syncopal episode. In review, she was shaving around 8:30 this morning, when he turned his head to the right, accompanied by some prodromal symptoms including nausea, and "shinning lights:, falling an hitting his head against the bathtub. He had lost consciousness for several minutes, but remained on the floor till 11 am, when he was found by his caretaker and EMS was called. Patient reported increased anxiety a the time, and complains of vague chest pain, without palpitations. He reports increasing shortness of breath without cough. He denies any jaw pain or left arm pain. He reports having similar dizzy spells over the last year but no presyncopal or syncopal events. No dysarthria or dysphagia. Denies any unilateral weakness or sensory deficiencies.  Denies any confusion. Of note, patient has trouble finding words since his last stroke. No seizures reported.  Denies any fever or chills, or night sweats. Denies any abdominal pain, or diarrhea. Denies any sick contacts or new foods. Denies any recent long distance trips. No recent surgeries. Denies worsening lower extremity swelling. Denies abnormal skin rashes, or neuropathy. Compliant with  medications. Does not smoke.  Assessment & Plan   Hypotension -possibly one cause of patient's syncope (along with aortic stenosis) -Unclear etiology for hypotension.  -upon admission denied fever, cough, chest pain, UR symptoms, no abdominal pain. Had some diarrhea few days ago which has resolved.    -Cardiology consulted and appreciated -Echocardiogram 04/29/2016: EF 55-60%, grade 1 diastolic dysfunction. Severe AS -Cefepime started empirically given that patient also had leukocytosis- discontinued 05/01/2016  Syncope  -D-Dimer 3.28, EKG unrevealing.  -CT head negative for acute intracranial abnormalities. -CT angio chest negative for central PE.  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  Widely patent vertebral arteries within the neck. There is a irregular curvilinear filling defect within the proximal left ICA, likely a small dissection flap (series 7, image 210). -Due to abnormal CT angio neck, vascular surgery consulted and appreciated  Severe aortic stenosis -Possibly cause of patient's syncope. -Noted on Echocardiogram and cardiac cath -CT surgery consulted and appreciated -CTA chest/abd/pelvis pending  ?Left ICA dissection -CTA neck as above -Vascular surgery consulted and did not recommend further acute intervention at this time. Patient already on anticoagulation  -s/p L CEA 2014- follows with Dr. Darrick Penna   Dental caries -Dentistry consulted, s/p multiple tooth extractions 1/26  Elevated troponin/NSTEMI -Echo as above -R/LHC: mild non-obstructive CAD, severe aortic stenosis (mean gradient 33.56mmHg)  Hypertension  -Hold BP medications as patient was hypotensive.   Paroxysmal Atrial Fibrillation  -CHA2DS2-VASc score 6  -Continue anticoagulation, currently Eliquis held due to Cath.   -Was placed on heparin- but was discontinued yesterday after cath. -Given current Afib, heparin held for dental surgery -Continue heparin  Hyperlipidemia -Continue statin  Peripheral Neuropathy -Continue Neurontin   Depression -Continue home Zoloft   Generalized  weakness -PT and  OT consulted- rec SNF  Constipation -continue bowel regimen -Had BM last night  Left humeral fracture -present on admission. Secondary to fall -Cast was placed on 12/20- patient sees [facility] Loral[facility]ran[facility] -Called the office on 1/26, cast is supposed to removed on 05/20/16 -Left humeral xray: no change in displaced, angulated comminuted left humeral fracture  DVT Prophylaxis  Heparin   Code Status: Full  Family Communication: none at bedside  Disposition Plan: Admitted. Pending results of CTA. Possible surgery 05/06/16  Consultants Cardiology Vascular surgery  Cardiothoracic surgery  Procedures  Echocardiogram R/LHC  Antibiotics   Anti-infectives    Start     Dose/Rate Route Frequency Ordered Stop   05/02/16 1015  ceFAZolin (ANCEF) 3 g in dextrose 5 % 50 mL IVPB     3 g 130 mL/hr over 30 Minutes Intravenous  Once 05/01/16 1602 05/02/16 1137   04/29/16 2200  vancomycin (VANCOCIN) IVPB 750 mg/150 ml premix  Status:  Discontinued     750 mg 150 mL/hr over 60 Minutes Intravenous Every 12 hours 04/29/16 0939 05/01/16 1021   04/29/16 0930  ceFEPIme (MAXIPIME) 2 g in dextrose 5 % 50 mL IVPB  Status:  Discontinued     2 g 100 mL/hr over 30 Minutes Intravenous Every 12 hours 04/29/16 0859 05/01/16 1021   04/29/16 0930  vancomycin (VANCOCIN) IVPB 1000 mg/200 mL premix     1,000 mg 200 mL/hr over 60 Minutes Intravenous  Once 04/29/16 0859 04/29/16 1143      Subjective:   Dorinda HillGeorge Chizek seen and examined today. Patient states he is feeling better today. Was able to have bowel movement yesterday. Denies chest pain, shortness of breath, abdominal pain, nausea or vomiting, diarrhea.   Objective:   Vitals:   05/03/16 1800 05/03/16 1936 05/04/16 0430 05/04/16 0916  BP: (!) 100/47 (!) 91/52 125/63 (!) 92/53  Pulse: 73 70 (!) 58 65  Resp:  18 20   Temp:  98.1 F (36.7 C) 97.6 F (36.4 C)   TempSrc:  Oral Oral   SpO2: 97% 96% 95% 94%  Weight:   (!)  142 kg (313 lb)   Height:        Intake/Output Summary (Last 24 hours) at 05/04/16 1116 Last data filed at 05/04/16 1023  Gross per 24 hour  Intake           642.16 ml  Output             2735 ml  Net         -2092.84 ml   Filed Weights   05/02/16 0623 05/03/16 0532 05/04/16 0430  Weight: (!) 142.2 kg (313 lb 6.4 oz) (!) 141.1 kg (311 lb 1.6 oz) (!) 142 kg (313 lb)    Exam  General: Well developed, well nourished, NAD  HEENT: NCAT,mucous membranes moist.    Cardiovascular: S1 S2 auscultated,3/6SEM-harsh, irregular  Respiratory: Clear to auscultation bilaterally with equal chest rise  Abdomen: Soft, obese, nontender, nondistended, + bowel sounds  Extremities: warm dry without cyanosis clubbing. Trace LE edema. LUE in cast  Neuro: AAOx3, nonfocal  Psych: Normal affect and demeanor, pleasant    Data Reviewed: I have personally reviewed following labs and imaging studies  CBC:  Recent Labs Lab 04/29/16 0856 05/01/16 0437 05/02/16 0432 05/03/16 0413 05/04/16 0508  WBC 9.7 9.5 8.2 12.9* 10.4  HGB 12.8* 13.4 13.0 13.4 13.2  HCT 40.1 41.6 39.9 41.1 41.0  MCV 95.0 94.5 93.9 93.0 94.7  PLT 309 290  262 303 253   Basic Metabolic Panel:  Recent Labs Lab 04/29/16 1145 05/01/16 0437 05/02/16 0432 05/03/16 0413 05/04/16 0508  NA 138 141 137 137 139  K 3.7 3.7 3.4* 3.9 3.8  CL 107 104 103 103 103  CO2 24 26 27 26 28   GLUCOSE 141* 107* 107* 139* 104*  BUN 11 6 8 10 16   CREATININE 1.01 0.88 0.94 0.87 0.89  CALCIUM 8.2* 8.6* 8.5* 8.7* 8.7*   GFR: Estimated Creatinine Clearance: 115.9 mL/min (by C-G formula based on SCr of 0.89 mg/dL). Liver Function Tests:  Recent Labs Lab 04/28/16 1209  AST 21  ALT 17  ALKPHOS 219*  BILITOT 0.5  PROT 7.3  ALBUMIN 3.8   No results for input(s): LIPASE, AMYLASE in the last 168 hours. No results for input(s): AMMONIA in the last 168 hours. Coagulation Profile: No results for input(s): INR, PROTIME in the last 168  hours. Cardiac Enzymes:  Recent Labs Lab 04/28/16 1249  04/29/16 1145 04/29/16 1653 04/29/16 2325 04/30/16 0547 04/30/16 1104  CKTOTAL 148  --   --   --   --   --   --   TROPONINI  --   < > <0.03 <0.03 <0.03 <0.03 <0.03  < > = values in this interval not displayed. BNP (last 3 results) No results for input(s): PROBNP in the last 8760 hours. HbA1C: No results for input(s): HGBA1C in the last 72 hours. CBG:  Recent Labs Lab 05/01/16 1544 05/02/16 0624 05/02/16 2354 05/03/16 0531 05/03/16 0614  GLUCAP 160* 148* 133* 140* 135*   Lipid Profile: No results for input(s): CHOL, HDL, LDLCALC, TRIG, CHOLHDL, LDLDIRECT in the last 72 hours. Thyroid Function Tests: No results for input(s): TSH, T4TOTAL, FREET4, T3FREE, THYROIDAB in the last 72 hours. Anemia Panel: No results for input(s): VITAMINB12, FOLATE, FERRITIN, TIBC, IRON, RETICCTPCT in the last 72 hours. Urine analysis:    Component Value Date/Time   COLORURINE YELLOW 04/29/2016 1233   APPEARANCEUR CLEAR 04/29/2016 1233   LABSPEC 1.021 04/29/2016 1233   PHURINE 6.0 04/29/2016 1233   GLUCOSEU NEGATIVE 04/29/2016 1233   HGBUR SMALL (A) 04/29/2016 1233   BILIRUBINUR NEGATIVE 04/29/2016 1233   KETONESUR NEGATIVE 04/29/2016 1233   PROTEINUR NEGATIVE 04/29/2016 1233   NITRITE NEGATIVE 04/29/2016 1233   LEUKOCYTESUR NEGATIVE 04/29/2016 1233   Sepsis Labs: @LABRCNTIP (procalcitonin:4,lacticidven:4)  ) Recent Results (from the past 240 hour(s))  Culture, blood (routine x 2)     Status: None (Preliminary result)   Collection Time: 04/29/16  9:20 AM  Result Value Ref Range Status   Specimen Description BLOOD RIGHT ARM  Final   Special Requests IN PEDIATRIC BOTTLE  2CC  Final   Culture NO GROWTH 4 DAYS  Final   Report Status PENDING  Incomplete  Culture, blood (routine x 2)     Status: None (Preliminary result)   Collection Time: 04/29/16  9:27 AM  Result Value Ref Range Status   Specimen Description BLOOD RIGHT HAND   Final   Special Requests IN PEDIATRIC BOTTLE  3 CC  Final   Culture NO GROWTH 4 DAYS  Final   Report Status PENDING  Incomplete  Urine culture     Status: None   Collection Time: 04/29/16 12:33 PM  Result Value Ref Range Status   Specimen Description URINE, CLEAN CATCH  Final   Special Requests NONE  Final   Culture NO GROWTH  Final   Report Status 04/30/2016 FINAL  Final  Surgical pcr screen  Status: Abnormal   Collection Time: 05/01/16  7:47 PM  Result Value Ref Range Status   MRSA, PCR NEGATIVE NEGATIVE Final   Staphylococcus aureus POSITIVE (A) NEGATIVE Final    Comment:        The Xpert SA Assay (FDA approved for NASAL specimens in patients over 25 years of age), is one component of a comprehensive surveillance program.  Test performance has been validated by [facility] for patients greater than or equal to 65 year old. It is not intended to diagnose infection nor to guide or monitor treatment.       Radiology Studies: Dg Forearm Left  Result Date: 05/03/2016 CLINICAL DATA:  Humeral fracture 5 weeks ago.  In cast. EXAM: LEFT FOREARM - 2 VIEW COMPARISON:  None. FINDINGS: In cast views of the left forearm demonstrate no acute bony abnormality. No fracture, subluxation or dislocation. IMPRESSION: No acute bony abnormality. Electronically Signed   By: Charlett Nose M.D.   On: 05/03/2016 12:51   Dg Humerus Left  Result Date: 05/03/2016 CLINICAL DATA:  Fracture EXAM: LEFT HUMERUS - 2+ VIEW COMPARISON:  04/28/2016 FINDINGS: Comminuted displaced and angulated fracture through the left humerus again noted. Exuberant callus formation noted. No real change since prior study. IMPRESSION: No change in the appearance of the displaced, angulated comminuted left humeral fracture. Electronically Signed   By: Charlett Nose M.D.   On: 05/03/2016 12:42     Scheduled Meds: . ALPRAZolam  0.5 mg Oral QHS  . atorvastatin  40 mg Oral q1800  . buPROPion  450 mg Oral Daily  . Chlorhexidine  Gluconate Cloth  6 each Topical Daily  . feeding supplement  1 Container Oral TID WC  . feeding supplement (PRO-STAT SUGAR FREE 64)  30 mL Oral TID BM  . gabapentin  800 mg Oral TID  . mouth rinse  15 mL Mouth Rinse BID  . metoprolol tartrate  25 mg Oral BID  . multivitamin with minerals  1 tablet Oral QHS  . mupirocin ointment  1 application Nasal BID  . sertraline  150 mg Oral Daily  . sodium chloride flush  3 mL Intravenous Q12H  . sodium chloride flush  3 mL Intravenous Q12H   Continuous Infusions: . heparin 1,800 Units/hr (05/04/16 0745)  . lactated ringers 10 mL/hr at 05/02/16 2207  . lactated ringers       LOS: 5 days   Time Spent in minutes   30 minutes  Lyna Laningham D.O. on 05/04/2016 at 11:16 AM  Between 7am to 7pm - Pager - [phone]  After 7pm go to [email or web address] - password [email or web address]  And look for the night coverage person covering for me after hours  [facility] Office  [phone]

## 2016-05-04 NOTE — Progress Notes (Signed)
Patient has discussed with family and is [facility].  Will notify Child psychotherapistsocial worker.

## 2016-05-04 NOTE — Clinical Social Work Note (Signed)
CSW presented bed offers to pt. Pt will consult with family.  CSW will continue to follow.  Hudson Lehmkuhl B. Gean QuintBrown,MSW, LCSWA Clinical Social Work Dept Weekend Social Worker [phone] 11:30 AM

## 2016-05-04 NOTE — Progress Notes (Signed)
Patient in no distress during 7 a to 7 p shift, heart rate remains largely in the 60's, BP still soft 90's/50s, 110's/60s as highest this shift.  Patient with complaints of feeling weak and tired as well as headache that he was medicated for twice with Tylenol during the shift.  Headache responds nicely to Tylenol. Remains on Heparin drip.  Son in to visit.

## 2016-05-04 NOTE — Progress Notes (Signed)
ANTICOAGULATION CONSULT NOTE  Pharmacy Consult for Heparin Indication: chest pain/ACS  Allergies  Allergen Reactions  . Coumadin [Warfarin Sodium] Rash    Patient Measurements: Height: 6\' 2"  (188 cm) Weight: (!) 313 lb (142 kg) IBW/kg (Calculated) : 82.2 Heparin Dosing Weight: 115.4 kg  Vital Signs: Temp: 98.2 F (36.8 C) (01/28 1555) Temp Source: Oral (01/28 1555) BP: 114/60 (01/28 1555) Pulse Rate: 68 (01/28 1555)  Labs:  Recent Labs  05/01/16 2014  05/02/16 0432 05/03/16 0413 05/03/16 1758 05/04/16 0508 05/04/16 1516  HGB  --   < > 13.0 13.4  --  13.2  --   HCT  --   --  39.9 41.1  --  41.0  --   PLT  --   --  262 303  --  253  --   APTT 63*  --  33 37*  --   --   --   HEPARINUNFRC 0.60  --  0.32 0.27* 0.45 0.69 0.50  CREATININE  --   --  0.94 0.87  --  0.89  --   < > = values in this interval not displayed.  Estimated Creatinine Clearance: 115.9 mL/min (by C-G formula based on SCr of 0.89 mg/dL).   Medical History: Past Medical History:  Diagnosis Date  . Atrial fibrillation (HCC)    pt on Eliquis  . Chest pain   . CHF (congestive heart failure) (HCC)   . Edema   . HTN (hypertension)   . Hyperlipidemia   . Neuropathy (HCC)   . Obesity   . SOB (shortness of breath)   . Stroke [facility](HCC)     Medications:  Prescriptions Prior to Admission  Medication Sig Dispense Refill Last Dose  . ALPRAZolam (XANAX) 0.5 MG tablet Take 0.5 mg by mouth at bedtime.    04/27/2016 at Unknown time  . apixaban (ELIQUIS) 5 MG TABS tablet Take 1 tablet (5 mg total) by mouth 2 (two) times daily. 60 tablet 0 04/27/2016 at 2200  . atorvastatin (LIPITOR) 40 MG tablet Take 1 tablet (40 mg total) by mouth daily at 6 PM. 30 tablet 0 04/27/2016 at Unknown time  . buPROPion (WELLBUTRIN SR) 150 MG 12 hr tablet Take 450 mg by mouth daily.    04/27/2016 at Unknown time  . furosemide (LASIX) 40 MG tablet Take 40 mg by mouth daily.   04/27/2016 at Unknown time  . gabapentin (NEURONTIN) 800 MG  tablet Take 800 mg by mouth 3 (three) times daily.    04/27/2016 at Unknown time  . hydrOXYzine (ATARAX/VISTARIL) 25 MG tablet Take 25 mg by mouth 3 (three) times daily as needed.   Past Month at Unknown time  . metoprolol tartrate (LOPRESSOR) 25 MG tablet Take 0.5 tablets (12.5 mg total) by mouth 2 (two) times daily. (Patient taking differently: Take 25 mg by mouth 2 (two) times daily. ) 30 tablet 0 04/27/2016 at 2200  . oxycodone (OXY-IR) 5 MG capsule Take 5 mg by mouth every 4 (four) hours as needed for pain.    04/27/2016 at Unknown time  . sertraline (ZOLOFT) 100 MG tablet Take 150 mg by mouth daily.    04/27/2016 at Unknown time  . traMADol (ULTRAM) 50 MG tablet Take 50 mg by mouth every 6 (six) hours as needed for moderate pain.    Past Month at Unknown time   Scheduled:  . ALPRAZolam  0.5 mg Oral QHS  . amiodarone  200 mg Oral BID  . atorvastatin  40 mg Oral  q1800  . buPROPion  450 mg Oral Daily  . Chlorhexidine Gluconate Cloth  6 each Topical Daily  . feeding supplement  1 Container Oral TID WC  . feeding supplement (PRO-STAT SUGAR FREE 64)  30 mL Oral TID BM  . gabapentin  800 mg Oral TID  . mouth rinse  15 mL Mouth Rinse BID  . metoprolol tartrate  25 mg Oral BID  . multivitamin with minerals  1 tablet Oral QHS  . mupirocin ointment  1 application Nasal BID  . sertraline  150 mg Oral Daily  . sodium chloride flush  3 mL Intravenous Q12H  . sodium chloride flush  3 mL Intravenous Q12H   Infusions:  . heparin 1,800 Units/hr (05/04/16 1510)  . lactated ringers 10 mL/hr at 05/02/16 2207  . lactated ringers      Assessment: 71 yo male with history of Afib on eliquis PTA presents with syncope. Pharmacy consulted to dose heparin for ACS/chest pain, cath showed non-obstructive CAD and severe aortic stenosis. Plan is likely TAVR 05/06/16.   Heparin was discontinued for dental surgery on 1/26 and never resumed although DDS notes state that it could be resumed. Per Dr. Catha Gosselin, resume  heparin 1/27. Last dose of Eliquis was 1/22.  Heparin level is therapeutic at 0.50 after most recent rate decrease to 1800 units/hr.  Goal of Therapy:  Heparin level 0.3-0.7 units/ml Monitor platelets by anticoagulation protocol: Yes   Plan:  -Continue heparin gtt at 1800 units/hr -Heparin level in am as confirmatory -Monitor daily heparin level, S/Sx bleeding, CBC -F/U cardiology plans  Pollyann Samples, PharmD, BCPS 05/04/2016, 4:08 PM

## 2016-05-04 NOTE — Progress Notes (Signed)
Subjective:  Lying in bed without a lot of complaints.  He had some paroxysmal atrial fibrillation overnight and has been given Cardizem which has been discontinued.  Currently in sinus rhythm.  No chest pain.  Eating reading of CT scan.  Objective:  Vital Signs in the last 24 hours: BP (!) 92/53 (BP Location: Right Arm)   Pulse 65   Temp 97.6 F (36.4 C) (Oral)   Resp 20   Ht 6\' 2"  (1.88 m)   Wt (!) 142 kg (313 lb)   SpO2 94%   BMI 40.19 kg/m   Physical Exam: Severely obese white male in no acute distress Lungs:  Rhonchi present bilaterally  Cardiac:  Regular rhythm, normal S1 and S2, no S3, harsh 2/6 systolic murmur at aortic area and down the left sternal border Abdomen:  Severely obese soft and nontender without mass Extremities:  trace edema present  Intake/Output from previous day: 01/27 0701 - 01/28 0700 In: 882.2 [P.O.:480; I.V.:402.2] Out: 2535 [Urine:2535] Weight Filed Weights   05/02/16 0623 05/03/16 0532 05/04/16 0430  Weight: (!) 142.2 kg (313 lb 6.4 oz) (!) 141.1 kg (311 lb 1.6 oz) (!) 142 kg (313 lb)    Lab Results: Basic Metabolic Panel:  Recent Labs  16/01/9600/27/18 0413 05/04/16 0508  NA 137 139  K 3.9 3.8  CL 103 103  CO2 26 28  GLUCOSE 139* 104*  BUN 10 16  CREATININE 0.87 0.89    CBC:  Recent Labs  05/03/16 0413 05/04/16 0508  WBC 12.9* 10.4  HGB 13.4 13.2  HCT 41.1 41.0  MCV 93.0 94.7  PLT 303 253    BNP    Component Value Date/Time   BNP 54.4 04/29/2016 0856    Telemetry: Sinus rhythm, he has some episodes of paroxysmal atrial fibrillation noted with rapid response but currently is in sinus  Personally reviewed  Assessment/Plan:  1.  Severe aortic stenosis with syncope-currently being evaluated for TAVR-workup is in progress and procedure tentatively planned for Tuesday 2.  Paroxysmal atrial fibrillation-Some recurrence. 3.  Prior stroke 4.  History of left carotid artery dissection  Recommendations:  Appears clinically  well still with some paroxysmal atrial fibrillation.  We need to go ahead and start him on amiodarone to prevent paroxysmal atrial fibrillation especially with TAVR planned on Tuesday.   Darden PalmerW. Spencer Tramond Slinker, Jr.  MD [facility]  05/04/2016, 11:21 AM

## 2016-05-04 NOTE — Progress Notes (Signed)
ANTICOAGULATION CONSULT NOTE  Pharmacy Consult for Heparin Indication: chest pain/ACS  Allergies  Allergen Reactions  . Coumadin [Warfarin Sodium] Rash    Patient Measurements: Height: 6\' 2"  (188 cm) Weight: (!) 313 lb (142 kg) IBW/kg (Calculated) : 82.2 Heparin Dosing Weight: 115.4 kg  Vital Signs: Temp: 97.6 F (36.4 C) (01/28 0430) Temp Source: Oral (01/28 0430) BP: 125/63 (01/28 0430) Pulse Rate: 58 (01/28 0430)  Labs:  Recent Labs  05/01/16 2014  05/02/16 0432 05/03/16 0413 05/03/16 1758 05/04/16 0508  HGB  --   < > 13.0 13.4  --  13.2  HCT  --   --  39.9 41.1  --  41.0  PLT  --   --  262 303  --  253  APTT 63*  --  33 37*  --   --   HEPARINUNFRC 0.60  --  0.32 0.27* 0.45 0.69  CREATININE  --   --  0.94 0.87  --  0.89  < > = values in this interval not displayed.  Estimated Creatinine Clearance: 115.9 mL/min (by C-G formula based on SCr of 0.89 mg/dL).   Medical History: Past Medical History:  Diagnosis Date  . Atrial fibrillation (HCC)    pt on Eliquis  . Chest pain   . CHF (congestive heart failure) (HCC)   . Edema   . HTN (hypertension)   . Hyperlipidemia   . Neuropathy (HCC)   . Obesity   . SOB (shortness of breath)   . Stroke [facility])     Medications:  Prescriptions Prior to Admission  Medication Sig Dispense Refill Last Dose  . ALPRAZolam (XANAX) 0.5 MG tablet Take 0.5 mg by mouth at bedtime.    04/27/2016 at Unknown time  . apixaban (ELIQUIS) 5 MG TABS tablet Take 1 tablet (5 mg total) by mouth 2 (two) times daily. 60 tablet 0 04/27/2016 at 2200  . atorvastatin (LIPITOR) 40 MG tablet Take 1 tablet (40 mg total) by mouth daily at 6 PM. 30 tablet 0 04/27/2016 at Unknown time  . buPROPion (WELLBUTRIN SR) 150 MG 12 hr tablet Take 450 mg by mouth daily.    04/27/2016 at Unknown time  . furosemide (LASIX) 40 MG tablet Take 40 mg by mouth daily.   04/27/2016 at Unknown time  . gabapentin (NEURONTIN) 800 MG tablet Take 800 mg by mouth 3 (three) times  daily.    04/27/2016 at Unknown time  . hydrOXYzine (ATARAX/VISTARIL) 25 MG tablet Take 25 mg by mouth 3 (three) times daily as needed.   Past Month at Unknown time  . metoprolol tartrate (LOPRESSOR) 25 MG tablet Take 0.5 tablets (12.5 mg total) by mouth 2 (two) times daily. (Patient taking differently: Take 25 mg by mouth 2 (two) times daily. ) 30 tablet 0 04/27/2016 at 2200  . oxycodone (OXY-IR) 5 MG capsule Take 5 mg by mouth every 4 (four) hours as needed for pain.    04/27/2016 at Unknown time  . sertraline (ZOLOFT) 100 MG tablet Take 150 mg by mouth daily.    04/27/2016 at Unknown time  . traMADol (ULTRAM) 50 MG tablet Take 50 mg by mouth every 6 (six) hours as needed for moderate pain.    Past Month at Unknown time   Scheduled:  . ALPRAZolam  0.5 mg Oral QHS  . atorvastatin  40 mg Oral q1800  . buPROPion  450 mg Oral Daily  . Chlorhexidine Gluconate Cloth  6 each Topical Daily  . feeding supplement  1 Container  Oral TID WC  . feeding supplement (PRO-STAT SUGAR FREE 64)  30 mL Oral TID BM  . gabapentin  800 mg Oral TID  . mouth rinse  15 mL Mouth Rinse BID  . metoprolol tartrate  25 mg Oral BID  . multivitamin with minerals  1 tablet Oral QHS  . mupirocin ointment  1 application Nasal BID  . sertraline  150 mg Oral Daily  . sodium chloride flush  3 mL Intravenous Q12H  . sodium chloride flush  3 mL Intravenous Q12H   Infusions:  . heparin 2,000 Units/hr (05/04/16 0041)  . lactated ringers 10 mL/hr at 05/02/16 2207  . lactated ringers      Assessment: 71 yo male with history of Afib on eliquis PTA presents with syncope. Pharmacy consulted to dose heparin for ACS/chest pain, cath showed non-obstructive CAD and severe aortic stenosis. Plan is likely TAVR 05/06/16.   Heparin was discontinued for dental surgery on 1/26 and never resumed although DDS notes state that it could be resumed. Per Dr. Catha GosselinMikhail, resume heparin 1/27. Last dose of Eliquis was 1/22 and pt previously had therapeutic  heparin levels x2 on 2000 units/hr, levels remain therapeutic on this rate but increased quickly to 0.69 so will reduce slightly. Hgb stable, no S/Sx bleeding noted.  Goal of Therapy:  Heparin level 0.3-0.7 units/ml aPTT 66-102 seconds Monitor platelets by anticoagulation protocol: Yes   Plan:  -Reduce heparin to 1800 units/hr -Recheck heparin level in 6-hr -Monitor daily heparin level, S/Sx bleeding, CBC -F/U cardiology plans  Fredonia HighlandMichael Shivaun Bilello, PharmD PGY-1 Pharmacy Resident Pager: [phone] 05/04/2016

## 2016-05-05 DIAGNOSIS — Z01818 Encounter for other preprocedural examination: Secondary | ICD-10-CM

## 2016-05-05 DIAGNOSIS — R0600 Dyspnea, unspecified: Secondary | ICD-10-CM

## 2016-05-05 DIAGNOSIS — I350 Nonrheumatic aortic (valve) stenosis: Secondary | ICD-10-CM

## 2016-05-05 DIAGNOSIS — R51 Headache: Secondary | ICD-10-CM

## 2016-05-05 LAB — GLUCOSE, CAPILLARY: Glucose-Capillary: 91 mg/dL (ref 65–99)

## 2016-05-05 LAB — BASIC METABOLIC PANEL
ANION GAP: 10 (ref 5–15)
BUN: 20 mg/dL (ref 6–20)
CHLORIDE: 103 mmol/L (ref 101–111)
CO2: 28 mmol/L (ref 22–32)
Calcium: 9 mg/dL (ref 8.9–10.3)
Creatinine, Ser: 0.89 mg/dL (ref 0.61–1.24)
GFR calc Af Amer: >60 mL/min (ref >60)
GLUCOSE: 96 mg/dL (ref 65–99)
POTASSIUM: 4 mmol/L (ref 3.5–5.1)
Sodium: 141 mmol/L (ref 135–145)

## 2016-05-05 LAB — HEPARIN LEVEL (UNFRACTIONATED): Heparin Unfractionated: 0.45 IU/mL (ref 0.30–0.70)

## 2016-05-05 LAB — CBC
HEMATOCRIT: 40.7 % (ref 39.0–52.0)
HEMOGLOBIN: 13 g/dL (ref 13.0–17.0)
MCH: 30.3 pg (ref 26.0–34.0)
MCHC: 31.9 g/dL (ref 30.0–36.0)
MCV: 94.9 fL (ref 78.0–100.0)
Platelets: 255 10*3/uL (ref 150–400)
RBC: 4.29 MIL/uL (ref 4.22–5.81)
RDW: 14.7 % (ref 11.5–15.5)
WBC: 8.6 10*3/uL (ref 4.0–10.5)

## 2016-05-05 LAB — ABO/RH: ABO/RH(D): O POS

## 2016-05-05 MED ORDER — SODIUM CHLORIDE 0.9 % IV SOLN
1500.0000 mg | INTRAVENOUS | Status: DC
  Filled 2016-05-05: qty 1500

## 2016-05-05 MED ORDER — DEXTROSE 5 % IV SOLN
750.0000 mg | INTRAVENOUS | Status: DC
  Filled 2016-05-05: qty 750

## 2016-05-05 MED ORDER — NITROGLYCERIN IN D5W 200-5 MCG/ML-% IV SOLN
2.0000 ug/min | INTRAVENOUS | Status: DC
  Filled 2016-05-05: qty 250

## 2016-05-05 MED ORDER — CHLORHEXIDINE GLUCONATE 0.12 % MT SOLN
15.0000 mL | Freq: Once | OROMUCOSAL | Status: AC
  Administered 2016-05-06: 15 mL via OROMUCOSAL

## 2016-05-05 MED ORDER — MAGNESIUM SULFATE 50 % IJ SOLN
40.0000 meq | INTRAMUSCULAR | Status: DC
Start: 2016-05-06 — End: 2016-05-06
  Filled 2016-05-05: qty 10

## 2016-05-05 MED ORDER — PLASMA-LYTE 148 IV SOLN
INTRAVENOUS | Status: AC
  Administered 2016-05-06: 1500 mL
  Filled 2016-05-05: qty 2.5

## 2016-05-05 MED ORDER — ENSURE ENLIVE PO LIQD
237.0000 mL | ORAL | Status: DC
  Administered 2016-05-06 – 2016-05-08 (×3): 237 mL via ORAL

## 2016-05-05 MED ORDER — METOPROLOL TARTRATE 12.5 MG HALF TABLET: 12.5000 mg | ORAL_TABLET | Freq: Once | ORAL | Status: DC

## 2016-05-05 MED ORDER — POTASSIUM CHLORIDE 2 MEQ/ML IV SOLN
80.0000 meq | INTRAVENOUS | Status: DC
Start: 2016-05-06 — End: 2016-05-06
  Filled 2016-05-05: qty 40

## 2016-05-05 MED ORDER — AMIODARONE HCL 150 MG/3ML IV SOLN
150.0000 mg | Freq: Once | INTRAVENOUS | Status: DC
  Filled 2016-05-05: qty 3

## 2016-05-05 MED ORDER — AMIODARONE HCL 150 MG/3ML IV SOLN
150.0000 mg | Freq: Once | INTRAVENOUS | Status: DC | PRN
  Filled 2016-05-05: qty 3

## 2016-05-05 MED ORDER — BISACODYL 5 MG PO TBEC
5.0000 mg | DELAYED_RELEASE_TABLET | Freq: Once | ORAL | Status: AC
  Administered 2016-05-05: 5 mg via ORAL
  Filled 2016-05-05: qty 1

## 2016-05-05 MED ORDER — DIAZEPAM 5 MG PO TABS: 5.0000 mg | ORAL_TABLET | Freq: Once | ORAL | Status: DC

## 2016-05-05 MED ORDER — TRANEXAMIC ACID (OHS) BOLUS VIA INFUSION
15.0000 mg/kg | INTRAVENOUS | Status: DC
  Filled 2016-05-05: qty 2133

## 2016-05-05 MED ORDER — SODIUM CHLORIDE 0.9 % IV SOLN
INTRAVENOUS | Status: DC
  Filled 2016-05-05: qty 2.5

## 2016-05-05 MED ORDER — DEXMEDETOMIDINE HCL IN NACL 400 MCG/100ML IV SOLN
0.1000 ug/kg/h | INTRAVENOUS | Status: DC
  Filled 2016-05-05: qty 100

## 2016-05-05 MED ORDER — DOPAMINE-DEXTROSE 3.2-5 MG/ML-% IV SOLN
0.0000 ug/kg/min | INTRAVENOUS | Status: DC
  Filled 2016-05-05: qty 250

## 2016-05-05 MED ORDER — CHLORHEXIDINE GLUCONATE CLOTH 2 % EX PADS
6.0000 | MEDICATED_PAD | Freq: Once | CUTANEOUS | Status: AC
  Administered 2016-05-05: 6 via TOPICAL

## 2016-05-05 MED ORDER — TRANEXAMIC ACID (OHS) PUMP PRIME SOLUTION
2.0000 mg/kg | INTRAVENOUS | Status: DC
  Filled 2016-05-05: qty 2.84

## 2016-05-05 MED ORDER — SODIUM CHLORIDE 0.9 % IV SOLN
30.0000 ug/min | INTRAVENOUS | Status: DC
  Filled 2016-05-05: qty 2

## 2016-05-05 MED ORDER — HEPARIN SODIUM (PORCINE) 1000 UNIT/ML IJ SOLN
INTRAMUSCULAR | Status: DC
  Filled 2016-05-05: qty 30

## 2016-05-05 MED ORDER — TRANEXAMIC ACID 1000 MG/10ML IV SOLN
1.5000 mg/kg/h | INTRAVENOUS | Status: DC
  Filled 2016-05-05: qty 25

## 2016-05-05 MED ORDER — TEMAZEPAM 15 MG PO CAPS: 15.0000 mg | ORAL_CAPSULE | Freq: Once | ORAL | Status: DC | PRN

## 2016-05-05 MED ORDER — DEXTROSE 5 % IV SOLN
1.5000 g | INTRAVENOUS | Status: DC
  Filled 2016-05-05: qty 1.5

## 2016-05-05 MED ORDER — EPINEPHRINE PF 1 MG/ML IJ SOLN
0.0000 ug/min | INTRAVENOUS | Status: DC
  Filled 2016-05-05: qty 4

## 2016-05-05 NOTE — Progress Notes (Signed)
RN in to administer Amiodarone bolus as per cards order, noted on monitor appeared that patient was in a SR in the 80's.  EKG performed, sinus rhythm at 85.  Held Amiodarone, text page sent to cards.

## 2016-05-05 NOTE — Progress Notes (Signed)
3 Days Post-Op Procedure(s) (LRB): Extraction of tooth #'s 7, 10, 23, 24, 25,and 26 with alveoloplasty. (N/A) Subjective:  No complaints. Has been ambulating in the hall today. No dizziness, no syncope, no SOB  Objective: Vital signs in last 24 hours: Temp:  [97.5 F (36.4 C)-98.2 F (36.8 C)] 97.5 F (36.4 C) (01/29 0840) Pulse Rate:  [62-136] 136 (01/29 1358) Cardiac Rhythm: Normal sinus rhythm (01/29 0700) Resp:  [18-20] 18 (01/29 0840) BP: (94-115)/(46-72) 104/56 (01/29 1358) SpO2:  [94 %-96 %] 96 % (01/29 1358) Weight:  [142.2 kg (313 lb 6.4 oz)] 142.2 kg (313 lb 6.4 oz) (01/29 0645)  Hemodynamic parameters for last 24 hours:    Intake/Output from previous day: 01/28 0701 - 01/29 0700 In: 1942.2 [P.O.:1160; I.V.:782.2] Out: 2400 [Urine:2400] Intake/Output this shift: Total I/O In: 480 [P.O.:480] Out: 1050 [Urine:1050]  General appearance: alert and cooperative Neurologic: intact Heart: regular rate and rhythm and systolic murmur  Lungs: clear to auscultation bilaterally  Lab Results:  Recent Labs  05/04/16 0508 05/05/16 0257  WBC 10.4 8.6  HGB 13.2 13.0  HCT 41.0 40.7  PLT 253 255   BMET:  Recent Labs  05/04/16 0508 05/05/16 0257  NA 139 141  K 3.8 4.0  CL 103 103  CO2 28 28  GLUCOSE 104* 96  BUN 16 20  CREATININE 0.89 0.89  CALCIUM 8.7* 9.0    PT/INR: No results for input(s): LABPROT, INR in the last 72 hours. ABG    Component Value Date/Time   PHART 7.394 04/30/2016 1736   HCO3 27.8 04/30/2016 1737   TCO2 29 04/30/2016 1737   O2SAT 62.0 04/30/2016 1737   CBG (last 3)   Recent Labs  05/03/16 0531 05/03/16 0614 05/05/16 0640  GLUCAP 140* 135* 91    Assessment/Plan:  Severe symptomatic aortic stenosis. I have personally reviewed his gated cardiac CT and CTA of the abdomen and pelvis. He has a relatively small annulus for his BMI but his anatomy is favorable for a 23 mm Sapien 3 valve. He has adequate pelvic arterial anatomy for a  transfemoral approach. We discussed complications that might develop including but not limited to risks of death, stroke, paravalvular leak, aortic dissection or other major vascular complications, aortic annulus rupture, device embolization, cardiac rupture or perforation, mitral regurgitation, acute myocardial infarction, arrhythmia, heart block or bradycardia requiring permanent pacemaker placement, congestive heart failure, respiratory failure, renal failure, pneumonia, infection, other late complications related to structural valve deterioration or migration, or other complications that might ultimately cause a temporary or permanent loss of functional independence or other long term morbidity. The patient provides full informed consent for the procedure as described and all questions were answered. We will plan to do transfemoral TAVR in the am.      LOS: 6 days    Alleen BorneBryan K Minas Bonser 05/05/2016

## 2016-05-05 NOTE — Progress Notes (Signed)
Patient with no complaints during 7 a to 7 p shift other than previously documented episode of atrial fib with rate in 130's.  Patient spontaneously converted back to SR and remained in the 70's and 80's for the rest of the shift.  Patient awaiting impending procedure on 05/06/16, has signed consent and does know he is NPO after midnight.  Remains on heparin drip at 1800 units.

## 2016-05-05 NOTE — Progress Notes (Signed)
ANTICOAGULATION CONSULT NOTE  Pharmacy Consult for Heparin Indication: chest pain/ACS  Allergies  Allergen Reactions  . Coumadin [Warfarin Sodium] Rash    Patient Measurements: Height: 6\' 2"  (188 cm) Weight: (!) 313 lb 6.4 oz (142.2 kg) IBW/kg (Calculated) : 82.2 Heparin Dosing Weight: 115.4 kg  Vital Signs: Temp: 97.5 F (36.4 C) (01/29 0840) Temp Source: Oral (01/29 0840) BP: 115/67 (01/29 0840) Pulse Rate: 69 (01/29 0840)  Labs:  Recent Labs  05/03/16 0413  05/04/16 0508 05/04/16 1516 05/05/16 0257  HGB 13.4  --  13.2  --  13.0  HCT 41.1  --  41.0  --  40.7  PLT 303  --  253  --  255  APTT 37*  --   --   --   --   HEPARINUNFRC 0.27*  < > 0.69 0.50 0.45  CREATININE 0.87  --  0.89  --  0.89  < > = values in this interval not displayed.  Estimated Creatinine Clearance: 116 mL/min (by C-G formula based on SCr of 0.89 mg/dL).    Medications:  Infusions:  . heparin 1,800 Units/hr (05/05/16 0534)  . lactated ringers Stopped (05/04/16 1810)  . lactated ringers      Assessment: 71 yo male with history of Afib on eliquis PTA presents with syncope. Pharmacy consulted to dose heparin for ACS/chest pain, cath showed non-obstructive CAD and severe aortic stenosis. Plan is TAVR 05/06/16.   Heparin was resumed 1/27. Heparin level is therapeutic at 0.45 on 1800 units/hr. No bleeding noted, CBC is stable.  Goal of Therapy:  Heparin level 0.3-0.7 units/ml Monitor platelets by anticoagulation protocol: Yes   Plan:  -Continue heparin drip at 1800 units/hr -Daily heparin level, CBC -Monitor for s/sx of bleeding   Loura BackJennifer Stockdale, PharmD, BCPS Clinical Pharmacist Phone for today [phone] Main pharmacy - [phone] 05/05/2016 8:48 AM

## 2016-05-05 NOTE — Progress Notes (Addendum)
Physical Therapy Treatment Patient Details Name: Jorge [name] MRN: [id number] DOB: 17-Sep-1945 Today's Date: 05/05/2016    History of Present Illness Jorge [name] is a 71 y.o. male  who presents with syncope. Cardiac vs neck/intracranial vascular vs PE. Pt w/ h/o falls including fall ~1 month ago resulting in LUE fracture - pt with cast and then a fall at home after "blacking out" & bumping head on day of admit. Workup in hospital showed severe aortic stenosis and pt likely to have TAVR on 1/30.    PT Comments    Pt with much improved mobility and activity tolerance. No lightheadedness or dizziness. Able to complete pre-TAVR assessment below.     6 Minute Walk Test  Distance - 311 ft      Pre    Post  BP     NT    NT  HR    NT    NT  SaO2    NT    NT  Modified Borg Dyspnea 0    3  RPE    6    13    5  Meter Walk Test  Trial   1) 14 sec   2) 13 sec  3) 12 sec  Average - 13 sec = 1.26 ft/sec   Clinical Frailty Scale - 4            Follow Up Recommendations  SNF     Equipment Recommendations  None recommended by PT    Recommendations for Other Services       Precautions / Restrictions Precautions Precautions: Fall Precaution Comments: syncope Restrictions Weight Bearing Restrictions: No    Mobility  Bed Mobility                  Transfers Overall transfer level: Needs assistance Equipment used: 1 person hand held assist Transfers: Sit to/from Stand Sit to Stand: Min assist         General transfer comment: assist for balance  Ambulation/Gait Ambulation/Gait assistance: Min assist Ambulation Distance (Feet): 320 Feet Assistive device:  (Pushing IV pole) Gait Pattern/deviations: Step-through pattern;Decreased step length - right;Decreased step length - left;Drifts right/left Gait velocity: decr Gait velocity interpretation: Below normal speed for age/gender General Gait Details: Assist for balance   Stairs             Wheelchair Mobility    Modified Rankin (Stroke Patients Only)       Balance Overall balance assessment: Needs assistance;History of Falls (Pt becomes very light headed with sitting EOB. ) Sitting-balance support: Feet supported;No upper extremity supported Sitting balance-Leahy Scale: Good     Standing balance support: No upper extremity supported Standing balance-Leahy Scale: Fair                      Cognition Arousal/Alertness: Awake/alert Behavior During Therapy: WFL for tasks assessed/performed Overall Cognitive Status: Within Functional Limits for tasks assessed                      Exercises      General Comments        Pertinent Vitals/Pain Pain Assessment: No/denies pain    Home Living                      Prior Function            PT Goals (current goals can now be found in the care plan section) Progress towards PT goals:  Progressing toward goals    Frequency    Min 3X/week      PT Plan Current plan remains appropriate    Co-evaluation             End of Session Equipment Utilized During Treatment: Gait belt Activity Tolerance: Patient tolerated treatment well Patient left: with call bell/phone within reach;in chair     Time: [id number] PT Time Calculation (min) (ACUTE ONLY): 31 min  Charges:  $Gait Training: 23-37 mins                    G CodesAngelina Ok:      Alix Lahmann W Maycok 05/05/2016, 4:45 PM [employer] CorporationCary Emmah Bratcher PT [phone]

## 2016-05-05 NOTE — Progress Notes (Signed)
Patient Name: Jorge [name] Date of Encounter: 05/05/2016  Active Problems:   Dyspnea   Aortic stenosis   History of CEA (carotid endarterectomy)   Chest pain   CAD (coronary artery disease)   History of tobacco abuse   HLD (hyperlipidemia)   CHF (congestive heart failure) (HCC)   Syncope   Hypotension   NSTEMI (non-ST elevated myocardial infarction) (HCC)   Hypoxemia   Length of Stay: 6  SUBJECTIVE  The patient feels well today, he was walking in the hallway and now laying flat comfortably.  CURRENT MEDS . ALPRAZolam  0.5 mg Oral QHS  . amiodarone  200 mg Oral BID  . atorvastatin  40 mg Oral q1800  . buPROPion  450 mg Oral Daily  . Chlorhexidine Gluconate Cloth  6 each Topical Daily  . feeding supplement  1 Container Oral TID WC  . feeding supplement (PRO-STAT SUGAR FREE 64)  30 mL Oral TID BM  . gabapentin  800 mg Oral TID  . mouth rinse  15 mL Mouth Rinse BID  . metoprolol tartrate  25 mg Oral BID  . multivitamin with minerals  1 tablet Oral QHS  . mupirocin ointment  1 application Nasal BID  . sertraline  150 mg Oral Daily  . sodium chloride flush  3 mL Intravenous Q12H  . sodium chloride flush  3 mL Intravenous Q12H    OBJECTIVE  Vitals:   05/04/16 2134 05/04/16 2159 05/05/16 0645 05/05/16 0840  BP: (!) 94/46 (!) 103/51 109/72 115/67  Pulse: 69  62 69  Resp: 18  18 18   Temp: 98.2 F (36.8 C)  97.5 F (36.4 C) 97.5 F (36.4 C)  TempSrc: Oral  Oral Oral  SpO2: 96%  96% 95%  Weight:   (!) 313 lb 6.4 oz (142.2 kg)   Height:        Intake/Output Summary (Last 24 hours) at 05/05/16 1327 Last data filed at 05/05/16 0841  Gross per 24 hour  Intake          1462.17 ml  Output             1600 ml  Net          -137.83 ml   Filed Weights   05/03/16 0532 05/04/16 0430 05/05/16 0645  Weight: (!) 311 lb 1.6 oz (141.1 kg) (!) 313 lb (142 kg) (!) 313 lb 6.4 oz (142.2 kg)   PHYSICAL EXAM  General: Pleasant, NAD. Obese Neuro: Alert and oriented X 3.  Moves all extremities spontaneously. Psych: Normal affect. HEENT:  Normal  Neck: Supple without bruits or JVD. Lungs:  Resp regular and unlabored, CTA. Heart: RRR no s3, s4, or murmurs. Abdomen: Soft, non-tender, non-distended, BS + x 4.  Extremities: No clubbing, cyanosis or edema. DP/PT/Radials 2+ and equal bilaterally. Cast over the left elbow  Accessory Clinical Findings  CBC  Recent Labs  05/04/16 0508 05/05/16 0257  WBC 10.4 8.6  HGB 13.2 13.0  HCT 41.0 40.7  MCV 94.7 94.9  PLT 253 255   Basic Metabolic Panel  Recent Labs  05/04/16 0508 05/05/16 0257  NA 139 141  K 3.8 4.0  CL 103 103  CO2 28 28  GLUCOSE 104* 96  BUN 16 20  CREATININE 0.89 0.89  CALCIUM 8.7* 9.0   Result Date: 05/05/2016 CLINICAL DATA:  Aortic stenosis EXAM:  IMPRESSION: 1) Poor quality motion degraded study 2) Trileaflet aortic valve with annulus of 378 mm2 suitable for a 23  mm Sapien 3 valve 3) Sufficient coronary height for deployment 4) No LAA thrombus 5) Optimum angle for delivery LAO 19 degrees Cranial 8 degrees Charlton HawsPeter Nishan Electronically Signed: By: Charlton HawsPeter  Nishan M.D. On: 05/05/2016 07:48   Dg Chest Port 1 View  Ct Angio Chest Aorta W/cm &/or Wo/cm  Result Date: 05/05/2016 CLINICAL DATA:  71 year old male with history of severe aortic stenosis. Preprocedural study prior to potential transcatheter aortic valve replacement (TAVR) procedure. Shortness of breath on exertion. Syncopal episodes.  IMPRESSION: 1. Vascular findings and measurements pertinent to potential TAVR procedure, as detailed above. Despite the limitations of today's examination there does appear to be suitable pelvic arterial access bilaterally. 2. Severe thickening and calcification of the aortic valve, compatible with the reported clinical history of severe aortic stenosis. 3. Aortic atherosclerosis, in addition to left main and left anterior descending coronary artery disease. Please note that although the presence of  coronary artery calcium documents the presence of coronary artery disease, the severity of this disease and any potential stenosis cannot be assessed on this non-gated CT examination. Assessment for potential risk factor modification, dietary therapy or pharmacologic therapy may be warranted, if clinically indicated. 4. No acute findings are noted in the chest, abdomen or pelvis. 5. Incidental findings, as above. Electronically Signed   By: Trudie Reedaniel  Entrikin M.D.   On: 05/05/2016 09:47   Ct Angio Abd/pel W/ And/or W/o  Result Date: 05/05/2016 CLINICAL DATA:  71 year old male with history of severe aortic stenosis. Preprocedural study prior to potential transcatheter aortic valve replacement (TAVR) procedure. Shortness of breath on exertion. Syncopal episodes. IMPRESSION: 1. Vascular findings and measurements pertinent to potential TAVR procedure, as detailed above. Despite the limitations of today's examination there does appear to be suitable pelvic arterial access bilaterally. 2. Severe thickening and calcification of the aortic valve, compatible with the reported clinical history of severe aortic stenosis. 3. Aortic atherosclerosis, in addition to left main and left anterior descending coronary artery disease. Please note that although the presence of coronary artery calcium documents the presence of coronary artery disease, the severity of this disease and any potential stenosis cannot be assessed on this non-gated CT examination. Assessment for potential risk factor modification, dietary therapy or pharmacologic therapy may be warranted, if clinically indicated. 4. No acute findings are noted in the chest, abdomen or pelvis. 5. Incidental findings, as above. Electronically Signed   By: Trudie Reedaniel  Entrikin M.D.   On: 05/05/2016 09:47    TELE: SR    ASSESSMENT AND PLAN  1.  Severe aortic stenosis with syncope-currently being evaluated for TAVR-workup is in progress and procedure tentatively planned for  Tuesday 2.  Paroxysmal atrial fibrillation - SR on telemetry in the last 24 hours. Dr Donnie Ahoilley started him on amiodarone as a prevention of post op a-fib. 3.  Prior stroke 4.  History of left carotid artery dissection  Recommendations: TAVR tomorrow, the patient is euvolemic. Cea 0.89, Hb 13.0, Pt 255. NPO after midnight.   Signed, Tobias AlexanderKatarina Yoan Sallade MD, [facility]ipley CountyFACC 05/05/2016

## 2016-05-05 NOTE — Progress Notes (Signed)
POST OPERATIVE NOTE:  05/05/2016 Jorge DroughtGeorge O Finks [id number]  VITALS: BP 115/67 (BP Location: Right Arm)   Pulse 69   Temp 97.5 F (36.4 C) (Oral)   Resp 18   Ht 6\' 2"  (1.88 m)   Wt (!) 313 lb 6.4 oz (142.2 kg)   SpO2 95%   BMI 40.24 kg/m   LABS:  Lab Results  Component Value Date   WBC 8.6 05/05/2016   HGB 13.0 05/05/2016   HCT 40.7 05/05/2016   MCV 94.9 05/05/2016   PLT 255 05/05/2016   BMET    Component Value Date/Time   NA 141 05/05/2016 0257   K 4.0 05/05/2016 0257   CL 103 05/05/2016 0257   CO2 28 05/05/2016 0257   GLUCOSE 96 05/05/2016 0257   BUN 20 05/05/2016 0257   CREATININE 0.89 05/05/2016 0257   CALCIUM 9.0 05/05/2016 0257   GFRNONAA >60 05/05/2016 0257   GFRAA >60 05/05/2016 0257    Lab Results  Component Value Date   INR 1.16 05/17/2015   INR 1.04 05/24/2014   No results found for: PTT   Jorge [name] is status post multiple extractions with alveoloplasty on 05/02/16 in the OR with [facility].  SUBJECTIVE: Patient with minimal complaints from extraction sites. Denies having any bleeding in his mouth.   EXAM: There is no sign of infection, heme, or ooze. Sutures are intact. Clots present.   ASSESSMENT: Post operative course is consistent with dental procedures performed in the OR.   PLAN: 1. Use Chlorhexidine 0.12% rinses BID in a swish and spit manner. 2. Patient is cleared for TAVR tomorrow. 3. Patient to F/U with Dentist of his choice for exam, radiographs and discussion of other dental treatment needs once medically stable from the TAVR procedure. Patient will need antibiotic premedication prior to invasive dental procedures per AHA guideliens after the TAVR procedure.   Charlynne Panderonald F. Lance Huar[place], DDS

## 2016-05-05 NOTE — Progress Notes (Signed)
Notified by central tele that patient back in atrial fib, rate up to 130's.  VS obtained.  Patient says he can tell his heart rate has increased and feels slightly short of breath.  O2 sat in 90's on room air.  Cards notified.

## 2016-05-05 NOTE — Progress Notes (Signed)
PROGRESS NOTE    Jorge [name]  ZOX:[id number] DOB: Mar 15, 1946 DOA: 04/28/2016 PCP: Jorge Peak, PA-C   Chief Complaint  Patient presents with  . Loss of Consciousness    Brief Narrative:  HPI on 04/28/2016 by Ms. [name], PA Jorge [name] is a 71 y.o. male with a history of paroxysmal atrial fibrillation on Eliquis , DCHF,  AS,  hypertension, hyperlipidemia, obey CT, prior history of stroke, carotid artery disease, brought to the ED after a syncopal episode. In review, she was shaving around 8:30 this morning, when he turned his head to the right, accompanied by some prodromal symptoms including nausea, and "shinning lights:, falling an hitting his head against the bathtub. He had lost consciousness for several minutes, but remained on the floor till 11 am, when he was found by his caretaker and EMS was called. Patient reported increased anxiety a the time, and complains of vague chest pain, without palpitations. He reports increasing shortness of breath without cough. He denies any jaw pain or left arm pain. He reports having similar dizzy spells over the last year but no presyncopal or syncopal events. No dysarthria or dysphagia. Denies any unilateral weakness or sensory deficiencies.  Denies any confusion. Of note, patient has trouble finding words since his last stroke. No seizures reported.  Denies any fever or chills, or night sweats. Denies any abdominal pain, or diarrhea. Denies any sick contacts or new foods. Denies any recent long distance trips. No recent surgeries. Denies worsening lower extremity swelling. Denies abnormal skin rashes, or neuropathy. Compliant with  medications. Does not smoke.  Assessment & Plan   Hypotension -possibly one cause of patient's syncope (along with aortic stenosis) -Unclear etiology for hypotension.  -upon admission denied fever, cough, chest pain, UR symptoms, no abdominal pain. Had some diarrhea few days ago which has resolved.    -Cardiology consulted and appreciated -Echocardiogram 04/29/2016: EF 55-60%, grade 1 diastolic dysfunction. Severe AS -Cefepime started empirically given that patient also had leukocytosis- discontinued 05/01/2016  Syncope  -D-Dimer 3.28, EKG unrevealing.  -CT head negative for acute intracranial abnormalities. -CT angio chest negative for central PE.  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  Widely patent vertebral arteries within the neck. There is a irregular curvilinear filling defect within the proximal left ICA, likely a small dissection flap (series 7, image 210). -Due to abnormal CT angio neck, vascular surgery consulted and appreciated  Severe aortic stenosis -Possibly cause of patient's syncope. -Noted on Echocardiogram and cardiac cath -CT surgery consulted and appreciated -CTA chest/abd/pelvis: severe thickening and calcification of the aortic valve. Aortic atherosclerosis, left main and LAD CAD.  -Plan for TAVR on 05/06/16  ?Left ICA dissection -CTA neck as above -Vascular surgery consulted and did not recommend further acute intervention at this time. Patient already on anticoagulation  -s/p L CEA 2014- follows with Dr. Darrick Penna   Dental caries -Dentistry consulted, s/p multiple tooth extractions 1/26  Elevated troponin/NSTEMI -Echo as above -R/LHC: mild non-obstructive CAD, severe aortic stenosis (mean gradient 33.109mmHg)  Hypertension  -Hold BP medications as patient was hypotensive.   Paroxysmal Atrial Fibrillation  -CHA2DS2-VASc score 6  -Continue anticoagulation, Eliquis held due to Cath and surgery   -Was placed on heparin- but was discontinued yesterday after cath. -Given current Afib, heparin held for dental surgery -Continue  heparin  Hyperlipidemia -Continue statin  Peripheral Neuropathy -Continue Neurontin   Depression -Continue home Zoloft   Generalized weakness -PT and OT consulted- rec SNF  Constipation -continue bowel regimen -Had BM last night  Headache -Possibly related to lack of caffeine as patient drinks 3-4 cups of coffee per day -Will provide caffeine to patient, if that does not help his headache may consider fioricet  Left humeral fracture -present on admission. Secondary to fall -Cast was placed on 12/20- [facility] -Called the office on 1/26, cast is supposed to removed on 05/20/16 -Left humeral xray: no change in displaced, angulated comminuted left humeral fracture  DVT Prophylaxis  Heparin   Code Status: Full  Family Communication: none at bedside  Disposition Plan: Admitted. Possible TAVR 05/06/16  Consultants Cardiology Vascular surgery  Cardiothoracic surgery  Procedures  Echocardiogram R/LHC  Antibiotics   Anti-infectives    Start     Dose/Rate Route Frequency Ordered Stop   05/02/16 1015  ceFAZolin (ANCEF) 3 g in dextrose 5 % 50 mL IVPB     3 g 130 mL/hr over 30 Minutes Intravenous  Once 05/01/16 1602 05/02/16 1137   04/29/16 2200  vancomycin (VANCOCIN) IVPB 750 mg/150 ml premix  Status:  Discontinued     750 mg 150 mL/hr over 60 Minutes Intravenous Every 12 hours 04/29/16 0939 05/01/16 1021   04/29/16 0930  ceFEPIme (MAXIPIME) 2 g in dextrose 5 % 50 mL IVPB  Status:  Discontinued     2 g 100 mL/hr over 30 Minutes Intravenous Every 12 hours 04/29/16 0859 05/01/16 1021   04/29/16 0930  vancomycin (VANCOCIN) IVPB 1000 mg/200 mL premix     1,000 mg 200 mL/hr over 60 Minutes Intravenous  Once 04/29/16 0859 04/29/16 1143      Subjective:   Jorge HillGeorge [name] seen and examined today. Patient states he is feeling better today. Complains of headache.  States he normally drinks 3-4 cups of coffee per day. Denies chest pain,  shortness of breath, abdominal pain, nausea or vomiting, diarrhea.   Objective:   Vitals:   05/04/16 2134 05/04/16 2159 05/05/16 0645 05/05/16 0840  BP: (!) 94/46 (!) 103/51 109/72 115/67  Pulse: 69  62 69  Resp: 18  18 18   Temp: 98.2 F (36.8 C)  97.5 F (36.4 C) 97.5 F (36.4 C)  TempSrc: Oral  Oral Oral  SpO2: 96%  96% 95%  Weight:   (!) 142.2 kg (313 lb 6.4 oz)   Height:        Intake/Output Summary (Last 24 hours) at 05/05/16 1248 Last data filed at 05/05/16 0841  Gross per 24 hour  Intake          1822.17 ml  Output             1600 ml  Net           222.17 ml   Filed Weights   05/03/16 0532 05/04/16 0430 05/05/16 0645  Weight: (!) 141.1 kg (311 lb 1.6 oz) (!) 142 kg (313 lb) (!) 142.2 kg (313 lb 6.4 oz)    Exam  General: Well developed, well nourished, NAD  HEENT: NCAT,mucous membranes moist.    Cardiovascular: S1 S2 auscultated,3/6SEM-harsh, irregular  Respiratory: Clear to auscultation bilaterally with equal chest rise  Abdomen: Soft, obese, nontender, nondistended, + bowel sounds  Extremities: warm dry without cyanosis clubbing. Trace LE edema. LUE in cast  Neuro: AAOx3, nonfocal  Psych: Normal affect and demeanor, pleasant  Data Reviewed: I have personally reviewed following labs and imaging studies  CBC:  Recent Labs Lab 05/01/16 0437 05/02/16 0432 05/03/16 0413 05/04/16 0508 05/05/16 0257  WBC 9.5 8.2 12.9* 10.4 8.6  HGB 13.4 13.0 13.4 13.2 13.0  HCT 41.6 39.9 41.1 41.0 40.7  MCV 94.5 93.9 93.0 94.7 94.9  PLT 290 262 303 253 255   Basic Metabolic Panel:  Recent Labs Lab 05/01/16 0437 05/02/16 0432 05/03/16 0413 05/04/16 0508 05/05/16 0257  NA 141 137 137 139 141  K 3.7 3.4* 3.9 3.8 4.0  CL 104 103 103 103 103  CO2 26 27 26 28 28   GLUCOSE 107* 107* 139* 104* 96  BUN 6 8 10 16 20   CREATININE 0.88 0.94 0.87 0.89 0.89  CALCIUM 8.6* 8.5* 8.7* 8.7* 9.0   GFR: Estimated Creatinine Clearance: 116 mL/min (by C-G formula based  on SCr of 0.89 mg/dL). Liver Function Tests: No results for input(s): AST, ALT, ALKPHOS, BILITOT, PROT, ALBUMIN in the last 168 hours. No results for input(s): LIPASE, AMYLASE in the last 168 hours. No results for input(s): AMMONIA in the last 168 hours. Coagulation Profile: No results for input(s): INR, PROTIME in the last 168 hours. Cardiac Enzymes:  Recent Labs Lab 04/28/16 1249  04/29/16 1145 04/29/16 1653 04/29/16 2325 04/30/16 0547 04/30/16 1104  CKTOTAL 148  --   --   --   --   --   --   TROPONINI  --   < > <0.03 <0.03 <0.03 <0.03 <0.03  < > = values in this interval not displayed. BNP (last 3 results) No results for input(s): PROBNP in the last 8760 hours. HbA1C: No results for input(s): HGBA1C in the last 72 hours. CBG:  Recent Labs Lab 05/02/16 0624 05/02/16 2354 05/03/16 0531 05/03/16 0614 05/05/16 0640  GLUCAP 148* 133* 140* 135* 91   Lipid Profile: No results for input(s): CHOL, HDL, LDLCALC, TRIG, CHOLHDL, LDLDIRECT in the last 72 hours. Thyroid Function Tests: No results for input(s): TSH, T4TOTAL, FREET4, T3FREE, THYROIDAB in the last 72 hours. Anemia Panel: No results for input(s): VITAMINB12, FOLATE, FERRITIN, TIBC, IRON, RETICCTPCT in the last 72 hours. Urine analysis:    Component Value Date/Time   COLORURINE YELLOW 04/29/2016 1233   APPEARANCEUR CLEAR 04/29/2016 1233   LABSPEC 1.021 04/29/2016 1233   PHURINE 6.0 04/29/2016 1233   GLUCOSEU NEGATIVE 04/29/2016 1233   HGBUR SMALL (A) 04/29/2016 1233   BILIRUBINUR NEGATIVE 04/29/2016 1233   KETONESUR NEGATIVE 04/29/2016 1233   PROTEINUR NEGATIVE 04/29/2016 1233   NITRITE NEGATIVE 04/29/2016 1233   LEUKOCYTESUR NEGATIVE 04/29/2016 1233   Sepsis Labs: @LABRCNTIP (procalcitonin:4,lacticidven:4)  ) Recent Results (from the past 240 hour(s))  Culture, blood (routine x 2)     Status: None   Collection Time: 04/29/16  9:20 AM  Result Value Ref Range Status   Specimen Description BLOOD RIGHT ARM   Final   Special Requests IN PEDIATRIC BOTTLE  2CC  Final   Culture NO GROWTH 5 DAYS  Final   Report Status 05/04/2016 FINAL  Final  Culture, blood (routine x 2)     Status: None   Collection Time: 04/29/16  9:27 AM  Result Value Ref Range Status   Specimen Description BLOOD RIGHT HAND  Final   Special Requests IN PEDIATRIC BOTTLE  3 CC  Final   Culture NO GROWTH 5 DAYS  Final   Report Status 05/04/2016 FINAL  Final  Urine culture     Status: None   Collection Time:  04/29/16 12:33 PM  Result Value Ref Range Status   Specimen Description URINE, CLEAN CATCH  Final   Special Requests NONE  Final   Culture NO GROWTH  Final   Report Status 04/30/2016 FINAL  Final  Surgical pcr screen     Status: Abnormal   Collection Time: 05/01/16  7:47 PM  Result Value Ref Range Status   MRSA, PCR NEGATIVE NEGATIVE Final   Staphylococcus aureus POSITIVE (A) NEGATIVE Final    Comment:        The Xpert SA Assay (FDA approved for NASAL specimens in patients over 40 years of age), is one component of a comprehensive surveillance program.  Test performance has been validated by [facility] for patients greater than or equal to 18 year old. It is not intended to diagnose infection nor to guide or monitor treatment.       Radiology Studies: No results found.   Scheduled Meds: . ALPRAZolam  0.5 mg Oral QHS  . amiodarone  200 mg Oral BID  . atorvastatin  40 mg Oral q1800  . buPROPion  450 mg Oral Daily  . Chlorhexidine Gluconate Cloth  6 each Topical Daily  . feeding supplement  1 Container Oral TID WC  . feeding supplement (PRO-STAT SUGAR FREE 64)  30 mL Oral TID BM  . gabapentin  800 mg Oral TID  . mouth rinse  15 mL Mouth Rinse BID  . metoprolol tartrate  25 mg Oral BID  . multivitamin with minerals  1 tablet Oral QHS  . mupirocin ointment  1 application Nasal BID  . sertraline  150 mg Oral Daily  . sodium chloride flush  3 mL Intravenous Q12H  . sodium chloride flush  3 mL  Intravenous Q12H   Continuous Infusions: . heparin 1,800 Units/hr (05/05/16 0534)  . lactated ringers Stopped (05/04/16 1810)  . lactated ringers       LOS: 6 days   Time Spent in minutes   30 minutes  Briaunna Grindstaff D.O. on 05/05/2016 at 12:48 PM  Between 7am to 7pm - Pager - [phone]  After 7pm go to [email or web address] - password [email or web address]  And look for the night coverage person covering for me after hours  [facility] Office  [phone]

## 2016-05-05 NOTE — Progress Notes (Signed)
Nutrition Follow-up  DOCUMENTATION CODES:   Morbid obesity  INTERVENTION:  Ensure Enlive po once daily, each supplement provides 350 kcal and 20 grams of protein  30 ml Pro-stat BID, each dose provides 100 kcal and 15 grams of protein  Multivitamin with minerals daily  NUTRITION DIAGNOSIS:   Predicted suboptimal nutrient intake related to mouth pain as evidenced by other (see comment) (recent dental extractions and clear liquid diet).  Ongoing/progressing  GOAL:   Patient will meet greater than or equal to 90% of their needs  Being met  MONITOR:   Diet advancement, Supplement acceptance, PO intake, Labs, Weight trends, Skin, I & O's  REASON FOR ASSESSMENT:     Assessment of nutrition requirement/status (due to multiple extractions)  ASSESSMENT:   71 year old male with a past medical history of PAF (on Eliquis), HTN, HLD, obesity, OSA, AS, and CVA. He presented to the ED on 04/28/16 with syncope.   Attempted to visit pt multiple times, but pt occupied. Pt's diet was advanced from clear liquids to a Soft diet on 1/27. Per nursing notes, pt has been eating 100% of all meals.  Pt has been accepting most Pro-stat doses and some Boost Breeze. Per chart, pt will be NPO at midnight.   Labs reviewed.   Diet Order:  DIET SOFT Room service appropriate? Yes; Fluid consistency: Thin Diet NPO time specified  Skin:  Reviewed, no issues (closed incision)  Last BM:  1/28  Height:   Ht Readings from Last 1 Encounters:  04/28/16 '6\' 2"'  (1.88 m)    Weight:   Wt Readings from Last 1 Encounters:  05/05/16 (!) 313 lb 6.4 oz (142.2 kg)    Ideal Body Weight:  86.36 kg  BMI:  Body mass index is 40.24 kg/m.  Estimated Nutritional Needs:   Kcal:  2100-2300  Protein:  120-130 grams  Fluid:  2.3 L/day  EDUCATION NEEDS:   No education needs identified at this time  [place], CSP, LDN Inpatient Clinical Dietitian Pager: [phone] After Hours Pager: [phone]

## 2016-05-05 NOTE — Consult Note (Signed)
[address]       [place] [address]             [phone]        Jorge [name] [facility] Medical Record #[id number] Date of Birth: 27-Feb-1946  Referring: No ref. provider found Primary Care: [name], PA-C  Chief Complaint:    Chief Complaint  Patient presents with  . Loss of Consciousness    History of Present Illness:     Patient examined, coronary angiogram and echo cardiogram and CT scan of chest personally reviewed and counseled with patient  71 year old Caucasian male with history of severe aortic stenosis with preserved LV systolic function presents with syncopal falls in the recent past resulting in both head trauma with loss of consciousness and fracture of his left upper extremity. 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 Echocardiogram showed trileaflet aortic valve with calcified leaflets and severe stenosis, peak gradient of 70 mmHg. Coronary angiogram showed minimal coronary artery disease. Brain MRI was negative for an acute stroke. The patient did have a left body stroke with residual weakness in the past. The patient has chronic atrial fibrillation on L Quist, hypertension, previous debilitating stroke, obesity and COPD. The patient was felt to benefit from aortic valve replacement-it was felt his syncopal episodes were related to his aortic stenosis -  however because of his strokes, chronic A. fib and deconditioning transcatheter aortic valve replacement was recommended.   Current Activity/ Functional Status: Patient lives alone but has a poor functional status   Zubrod Score: At the time of surgery this patient's most appropriate activity status/level should be described as: []     0    Normal activity, no symptoms []     1    Restricted in physical strenuous activity but ambulatory, able to do out light work []     2    Ambulatory  and capable of self care, unable to do work activities, up and about                 more than 50%  Of the time                            []     3    Only limited self care, in bed greater than 50% of waking hours []     4    Completely disabled, no self care, confined to bed or chair []     5    Moribund  Past Medical History:  Diagnosis Date  . Atrial fibrillation (HCC)    pt on Eliquis  . Chest pain   . CHF (congestive heart failure) (HCC)   . Edema   . HTN (hypertension)   . Hyperlipidemia   . Neuropathy (HCC)   . Obesity   . SOB (shortness of breath)   . Stroke [facility])     Past Surgical History:  Procedure Laterality Date  . BACK SURGERY    . CARDIAC CATHETERIZATION    . CARDIAC CATHETERIZATION N/A 04/30/2016   Procedure: Right/Left Heart Cath and Coronary Angiography;  Surgeon: Kathleene Hazel, MD;  Location: [facility] INVASIVE CV LAB;  Service: Cardiovascular;  Laterality: N/A;  . CAROTID ANGIOGRAM N/A 05/30/2014   Procedure: Dorise Bullion;  Surgeon: Nada Libman, MD;  Location: [facility] CATH LAB;  Service: Cardiovascular;  Laterality: N/A;  . MULTIPLE EXTRACTIONS WITH ALVEOLOPLASTY N/A 05/02/2016   Procedure: Extraction of tooth #'s 7, 10, 23, 24, 25,and 26 with alveoloplasty.;  Surgeon: Charlynne Pander, DDS;  Location: [facility] OR;  Service: Oral Surgery;  Laterality: N/A;    History  Smoking Status  . Former Smoker  Smokeless Tobacco  . Former Neurosurgeon  . Quit date: 03/22/1996    History  Alcohol Use No    Social History   Social History  . Marital status: Divorced    Spouse name: N/A  . Number of children: N/A  . Years of education: N/A   Occupational History  . Not on file.   Social History Main Topics  . Smoking status: Former Games developer  . Smokeless tobacco: Former Neurosurgeon    Quit date: 03/22/1996  . Alcohol use No  . Drug use: No  . Sexual activity: Not on file   Other Topics Concern  . Not on file   Social History Narrative  . No narrative on file     Allergies  Allergen Reactions  . Coumadin [Warfarin Sodium] Rash    Current Facility-Administered Medications  Medication Dose Route Frequency Provider Last Rate Last Dose  . 0.9 %  sodium chloride infusion  250 mL Intravenous PRN Kathleene Hazel, MD      . acetaminophen (TYLENOL) tablet 650 mg  650 mg Oral Q6H PRN Marcos Eke, PA-C   650 mg at 05/05/16 1610   Or  . acetaminophen (TYLENOL) suppository 650 mg  650 mg Rectal Q6H PRN Marcos Eke, PA-C      . ALPRAZolam Prudy Feeler) tablet 0.5 mg  0.5 mg Oral QHS Marcos Eke, PA-C   0.5 mg at 05/04/16 2149  . alum & mag hydroxide-simeth (MAALOX/MYLANTA) 200-200-20 MG/5ML suspension 30 mL  30 mL Oral Q6H PRN Marcos Eke, PA-C      . amiodarone (CORDARONE) injection 150 mg  150 mg Intravenous Once PRN Rhonda G Barrett, PA-C      . amiodarone (PACERONE) tablet 200 mg  200 mg Oral BID Othella Boyer, MD   200 mg at 05/05/16 1055  . atorvastatin (LIPITOR) tablet 40 mg  40 mg Oral q1800 Marcos Eke, PA-C   40 mg at 05/04/16 1736  . bisacodyl (DULCOLAX) EC tablet 5 mg  5 mg Oral Daily PRN Edsel Petrin, DO   5 mg at 05/03/16 0815  . bisacodyl (DULCOLAX) EC tablet 5 mg  5 mg Oral Once Alleen Borne, MD      . bisacodyl (DULCOLAX) suppository 10 mg  10 mg Rectal Daily PRN Edsel Petrin, DO   10 mg at 05/03/16 1907  . buPROPion (WELLBUTRIN SR) 12 hr tablet 450 mg  450 mg Oral Daily Marcos Eke, PA-C   450 mg at 05/05/16 1055  . [START ON 05/06/2016] cefUROXime (ZINACEF) 1.5 g in dextrose 5 % 50 mL IVPB  1.5 g Intravenous To OR Alleen Borne, MD      . Melene Muller ON 05/06/2016] cefUROXime (ZINACEF) 750 mg in dextrose 5 % 50 mL IVPB  750 mg Intravenous To OR Alleen Borne, MD      . Chlorhexidine Gluconate Cloth 2 % PADS 6 each  6 each Topical Daily Maryann Mikhail, DO   6 each at 05/05/16 [phone]  . [START ON 05/06/2016] dexmedetomidine (PRECEDEX) 400 MCG/100ML (4 mcg/mL) infusion  0.1-0.7 mcg/kg/hr Intravenous To OR Alleen Borne,  MD      . [  START ON 05/06/2016] diazepam (VALIUM) tablet 5 mg  5 mg Oral Once Alleen BorneBryan K Bartle, MD      . Melene Muller[START ON 05/06/2016] DOPamine (INTROPIN) 800 mg in dextrose 5 % 250 mL (3.2 mg/mL) infusion  0-10 mcg/kg/min Intravenous To OR Alleen BorneBryan K Bartle, MD      . Melene Muller[START ON 05/06/2016] EPINEPHrine (ADRENALIN) 4 mg in dextrose 5 % 250 mL (0.016 mg/mL) infusion  0-10 mcg/min Intravenous To OR Alleen BorneBryan K Bartle, MD      . feeding supplement (ENSURE ENLIVE) (ENSURE ENLIVE) liquid 237 mL  237 mL Oral Q24H Maryann Mikhail, DO      . feeding supplement (PRO-STAT SUGAR FREE 64) liquid 30 mL  30 mL Oral TID BM Maryann Mikhail, DO   30 mL at 05/05/16 1055  . gabapentin (NEURONTIN) capsule 800 mg  800 mg Oral TID Marcos EkeSara E Wertman, PA-C   800 mg at 05/05/16 1541  . [START ON 05/06/2016] heparin 2,500 Units, papaverine 30 mg in electrolyte-148 (PLASMALYTE-148) 500 mL irrigation   Irrigation To OR Alleen BorneBryan K Bartle, MD      . Melene Muller[START ON 05/06/2016] heparin 30,000 units/NS 1000 mL solution for CELLSAVER   Other To OR Alleen BorneBryan K Bartle, MD      . heparin ADULT infusion 100 units/mL (25000 units/26550mL sodium chloride 0.45%)  1,800 Units/hr Intravenous Continuous Mosetta AnisMichael T Bitonti, RPH 18 mL/hr at 05/05/16 0534 1,800 Units/hr at 05/05/16 0534  . [START ON 05/06/2016] insulin regular (NOVOLIN R,HUMULIN R) 250 Units in sodium chloride 0.9 % 250 mL (1 Units/mL) infusion   Intravenous To OR Alleen BorneBryan K Bartle, MD      . lactated ringers infusion   Intravenous Continuous Heather RobertsJames Singer, MD   Stopped at 05/04/16 1810  . lactated ringers infusion   Intravenous Continuous Charlynne Panderonald F Kulinski, DDS      . [START ON 05/06/2016] magnesium sulfate (IV Push/IM) injection 40 mEq  40 mEq Other To OR Alleen BorneBryan K Bartle, MD      . MEDLINE mouth rinse  15 mL Mouth Rinse BID Maryann Mikhail, DO   15 mL at 05/05/16 0847  . [START ON 05/06/2016] metoprolol tartrate (LOPRESSOR) tablet 12.5 mg  12.5 mg Oral Once Alleen BorneBryan K Bartle, MD      . metoprolol tartrate (LOPRESSOR) tablet 25  mg  25 mg Oral BID Nada MaclachlanGregory S Harris, MD   25 mg at 05/03/16 2131  . morphine 2 MG/ML injection 2-4 mg  2-4 mg Intravenous Q2H PRN Charlynne Panderonald F Kulinski, DDS      . multivitamin with minerals tablet 1 tablet  1 tablet Oral QHS Maryann Mikhail, DO   1 tablet at 05/04/16 2149  . mupirocin ointment (BACTROBAN) 2 % 1 application  1 application Nasal BID Edsel PetrinMaryann Mikhail, DO   1 application at 05/05/16 [phone]  . [START ON 05/06/2016] nitroGLYCERIN 50 mg in dextrose 5 % 250 mL (0.2 mg/mL) infusion  2-200 mcg/min Intravenous To OR Alleen BorneBryan K Bartle, MD      . ondansetron (ZOFRAN) tablet 4 mg  4 mg Oral Q6H PRN Marcos EkeSara E Wertman, PA-C   4 mg at 04/30/16 1131   Or  . ondansetron (ZOFRAN) injection 4 mg  4 mg Intravenous Q6H PRN Marcos EkeSara E Wertman, PA-C   4 mg at 04/28/16 2236  . [START ON 05/06/2016] phenylephrine (NEO-SYNEPHRINE) 20 mg in sodium chloride 0.9 % 250 mL (0.08 mg/mL) infusion  30-200 mcg/min Intravenous To OR Alleen BorneBryan K Bartle, MD      . Melene Muller[START ON 05/06/2016]  potassium chloride injection 80 mEq  80 mEq Other To OR Alleen Borne, MD      . senna-docusate (Senokot-S) tablet 1 tablet  1 tablet Oral QHS PRN Marcos Eke, PA-C      . sertraline (ZOLOFT) tablet 150 mg  150 mg Oral Daily Marcos Eke, PA-C   150 mg at 05/05/16 1055  . sodium chloride flush (NS) 0.9 % injection 3 mL  3 mL Intravenous Q12H Marcos Eke, PA-C   3 mL at 05/03/16 2132  . sodium chloride flush (NS) 0.9 % injection 3 mL  3 mL Intravenous Q12H Kathleene Hazel, MD   3 mL at 05/05/16 1056  . sodium chloride flush (NS) 0.9 % injection 3 mL  3 mL Intravenous PRN Kathleene Hazel, MD      . temazepam (RESTORIL) capsule 15 mg  15 mg Oral Once PRN Alleen Borne, MD      . Melene Muller ON 05/06/2016] tranexamic acid (CYKLOKAPRON) 2,500 mg in sodium chloride 0.9 % 250 mL (10 mg/mL) infusion  1.5 mg/kg/hr Intravenous To OR Alleen Borne, MD      . Melene Muller ON 05/06/2016] tranexamic acid (CYKLOKAPRON) bolus via infusion - over 30 minutes 2,133 mg   15 mg/kg Intravenous To OR Alleen Borne, MD      . Melene Muller ON 05/06/2016] tranexamic acid (CYKLOKAPRON) pump prime solution 284 mg  2 mg/kg Intracatheter To OR Alleen Borne, MD      . Melene Muller ON 05/06/2016] vancomycin (VANCOCIN) 1,500 mg in sodium chloride 0.9 % 250 mL IVPB  1,500 mg Intravenous To OR Alleen Borne, MD        Prescriptions Prior to Admission  Medication Sig Dispense Refill Last Dose  . ALPRAZolam (XANAX) 0.5 MG tablet Take 0.5 mg by mouth at bedtime.    04/27/2016 at Unknown time  . apixaban (ELIQUIS) 5 MG TABS tablet Take 1 tablet (5 mg total) by mouth 2 (two) times daily. 60 tablet 0 04/27/2016 at 2200  . atorvastatin (LIPITOR) 40 MG tablet Take 1 tablet (40 mg total) by mouth daily at 6 PM. 30 tablet 0 04/27/2016 at Unknown time  . buPROPion (WELLBUTRIN SR) 150 MG 12 hr tablet Take 450 mg by mouth daily.    04/27/2016 at Unknown time  . furosemide (LASIX) 40 MG tablet Take 40 mg by mouth daily.   04/27/2016 at Unknown time  . gabapentin (NEURONTIN) 800 MG tablet Take 800 mg by mouth 3 (three) times daily.    04/27/2016 at Unknown time  . hydrOXYzine (ATARAX/VISTARIL) 25 MG tablet Take 25 mg by mouth 3 (three) times daily as needed.   Past Month at Unknown time  . metoprolol tartrate (LOPRESSOR) 25 MG tablet Take 0.5 tablets (12.5 mg total) by mouth 2 (two) times daily. (Patient taking differently: Take 25 mg by mouth 2 (two) times daily. ) 30 tablet 0 04/27/2016 at 2200  . oxycodone (OXY-IR) 5 MG capsule Take 5 mg by mouth every 4 (four) hours as needed for pain.    04/27/2016 at Unknown time  . sertraline (ZOLOFT) 100 MG tablet Take 150 mg by mouth daily.    04/27/2016 at Unknown time  . traMADol (ULTRAM) 50 MG tablet Take 50 mg by mouth every 6 (six) hours as needed for moderate pain.    Past Month at Unknown time    Family History  Problem Relation Age of Onset  . Heart attack Mother   . Stroke Father  Review of Systems:       Cardiac Review of Systems: Y or N  Chest  Pain [  No  ]  Resting SOB [email or web address]   ] Exertional SOB  [ yes ]  Orthopnea [ no ]   Pedal Edema [ yes  ]    Palpitations Mahler.Beck  ] Syncope  [ Yes ]   Presyncope Mahler.Beck   ]  General Review of Systems: [Y] = yes [  ]=no Constitional: recent weight change [  ]; anorexia [  ]; fatigue [  ]; nausea [  ]; night sweats [  ]; fever [  ]; or chills [  ]                                                               Dental: poor dentition[ yes  ]; Last Dentist visit: Dental extractions last week   Eye : blurred vision [  ]; diplopia [   ]; vision changes [  ];  Amaurosis fugax[  ]; Resp: cough [  ];  wheezing[  ];  hemoptysis[  ]; shortness of breath[  ]; paroxysmal nocturnal dyspnea[  ]; dyspnea on exertio yes n[  ]; or orthopnea[  ];  GI:  gallstones[  ], vomiting[  ];  dysphagia[  ]; melena[  ];  hematochezia [  ]; heartburn[  ];   Hx of  Colonoscopy[  ]; GU: kidney stones [  ]; hematuria[  ];   dysuria [  ];  nocturia[  ];  history of     obstruction [  ]; urinary frequency [ yes ]             Skin: rash, swelling[  ];, hair loss[  ];  peripheral edema[  ];  or itching[  ]; Musculosketetal: myalgias[  ];  joint swelling[  ];  joint erythema[  ];  joint pain[ yes left elbow fracture ];  back pain[ yes ];  Heme/Lymph: bruising[  ];  bleeding[  ];  anemia[  ];  Neuro: TIA[  ];  headaches[  ];  stroke[  ];  vertigo[  ];  seizures[  ];   paresthesias[  ];  difficulty walking[  ];  Psych:depression[  ]; anxiety[  ];  Endocrine: diabetes[  ];  thyroid dysfunction[  ];  Immunizations: Flu [  ]; Pneumococcal[  ];  Other: Left-hand-dominant  Physical Exam: BP (!) 104/56 (BP Location: Right Arm)   Pulse (!) 136   Temp 97.5 F (36.4 C) (Oral)   Resp 18   Ht 6\' 2"  (1.88 m)   Wt (!) 313 lb 6.4 oz (142.2 kg)   SpO2 96%   BMI 40.24 kg/m        Physical Exam  General: Obese chronically ill appearing Caucasian male wearing a long-arm cast left side HEENT: Normocephalic pupils equal , dentition adequate-recently  removed teeth Neck: Supple without JVD, adenopathy, or bruit Chest: Scattered coarse but, symmetrical breath sounds, no rhonchi, no tenderness             or deformity Cardiovascular: Regular rate and rhythm, 3/6 systolic ejection murmur of aortic stenosis, no gallop, peripheral pulses             palpable in all extremities Abdomen:  Soft, nontender, no  palpable mass or organomegaly Extremities: Warm, well-perfused, no clubbing cyanosis edema or tenderness,              no venous stasis changes of the legs Rectal/GU: Deferred Neuro: Left-sided weakness, mild Skin: Clean and dry without rash or ulceration   Diagnostic Studies & Laboratory data:     Recent Radiology Findings:   No results found.   I have independently reviewed the above radiologic studies.  Recent Lab Findings: Lab Results  Component Value Date   WBC 8.6 05/05/2016   HGB 13.0 05/05/2016   HCT 40.7 05/05/2016   PLT 255 05/05/2016   GLUCOSE 96 05/05/2016   CHOL 147 05/25/2014   TRIG 169 (H) 05/25/2014   HDL 33 (L) 05/25/2014   LDLCALC 80 05/25/2014   ALT 17 04/28/2016   AST 21 04/28/2016   NA 141 05/05/2016   K 4.0 05/05/2016   CL 103 05/05/2016   CREATININE 0.89 05/05/2016   BUN 20 05/05/2016   CO2 28 05/05/2016   INR 1.16 05/17/2015   HGBA1C 5.7 (H) 05/25/2014      Assessment / Plan:      The patient has severe aortic stenosis with several syncopal episodes resulting in head trauma and upper extremity fracture. Coronary disease is not significant and LV systolic function is fairly well preserved. He would benefit from aortic valve replacement however with his morbid obesity and previous strokes and cerebrovascular disease he would be at high risk for a good outcome. I agree that trans  catheter aortic valve replacement would be his best therapy and give him the best functional result for treatment of his severe aortic stenosis.   .    @ME1 @ 05/05/2016 5:30 PM

## 2016-05-06 ENCOUNTER — Inpatient Hospital Stay (HOSPITAL_COMMUNITY): Payer: Medicare Other

## 2016-05-06 ENCOUNTER — Inpatient Hospital Stay (HOSPITAL_COMMUNITY): Payer: Medicare Other | Admitting: Certified Registered"

## 2016-05-06 ENCOUNTER — Encounter (HOSPITAL_COMMUNITY)
Admission: EM | Disposition: A | Discharge status: Home Health | Payer: Self-pay | Source: Home / Self Care | Attending: Internal Medicine

## 2016-05-06 DIAGNOSIS — I350 Nonrheumatic aortic (valve) stenosis: Secondary | ICD-10-CM

## 2016-05-06 DIAGNOSIS — Z006 Encounter for examination for normal comparison and control in clinical research program: Secondary | ICD-10-CM

## 2016-05-06 HISTORY — PX: TEE WITHOUT CARDIOVERSION: SHX5443

## 2016-05-06 HISTORY — PX: TRANSCATHETER AORTIC VALVE REPLACEMENT, TRANSFEMORAL: SHX6400

## 2016-05-06 LAB — POCT I-STAT, CHEM 8
BUN: 19 mg/dL (ref 6–20)
BUN: 19 mg/dL (ref 6–20)
BUN: 19 mg/dL (ref 6–20)
CALCIUM ION: 1.14 mmol/L — AB (ref 1.15–1.40)
CHLORIDE: 100 mmol/L — AB (ref 101–111)
CHLORIDE: 100 mmol/L — AB (ref 101–111)
CREATININE: 0.7 mg/dL (ref 0.61–1.24)
CREATININE: 0.8 mg/dL (ref 0.61–1.24)
CREATININE: 0.9 mg/dL (ref 0.61–1.24)
Calcium, Ion: 1.16 mmol/L (ref 1.15–1.40)
Calcium, Ion: 1.15 mmol/L (ref 1.15–1.40)
Chloride: 99 mmol/L — ABNORMAL LOW (ref 101–111)
GLUCOSE: 99 mg/dL (ref 65–99)
GLUCOSE: 163 mg/dL — AB (ref 65–99)
Glucose, Bld: 149 mg/dL — ABNORMAL HIGH (ref 65–99)
HCT: 40 % (ref 39.0–52.0)
HCT: 41 % (ref 39.0–52.0)
HEMATOCRIT: 40 % (ref 39.0–52.0)
Hemoglobin: 13.6 g/dL (ref 13.0–17.0)
Hemoglobin: 13.6 g/dL (ref 13.0–17.0)
Hemoglobin: 13.9 g/dL (ref 13.0–17.0)
POTASSIUM: 4 mmol/L (ref 3.5–5.1)
POTASSIUM: 4.2 mmol/L (ref 3.5–5.1)
Potassium: 4.1 mmol/L (ref 3.5–5.1)
Sodium: 139 mmol/L (ref 135–145)
Sodium: 136 mmol/L (ref 135–145)
Sodium: 138 mmol/L (ref 135–145)
TCO2: 31 mmol/L (ref 0–100)
TCO2: 27 mmol/L (ref 0–100)
TCO2: 28 mmol/L (ref 0–100)

## 2016-05-06 LAB — POCT I-STAT 4, (NA,K, GLUC, HGB,HCT)
GLUCOSE: 140 mg/dL — AB (ref 65–99)
HEMATOCRIT: 40 % (ref 39.0–52.0)
HEMOGLOBIN: 13.6 g/dL (ref 13.0–17.0)
POTASSIUM: 3.8 mmol/L (ref 3.5–5.1)
SODIUM: 140 mmol/L (ref 135–145)

## 2016-05-06 LAB — POCT I-STAT 3, ART BLOOD GAS (G3+)
ACID-BASE EXCESS: 1 mmol/L (ref 0.0–2.0)
ACID-BASE EXCESS: 4 mmol/L — AB (ref 0.0–2.0)
BICARBONATE: 30.8 mmol/L — AB (ref 20.0–28.0)
Bicarbonate: 26.2 mmol/L (ref 20.0–28.0)
O2 SAT: 99 %
O2 Saturation: 99 %
PH ART: 7.364 (ref 7.350–7.450)
TCO2: 27 mmol/L (ref 0–100)
TCO2: 32 mmol/L (ref 0–100)
pCO2 arterial: 41.4 mmHg (ref 32.0–48.0)
pCO2 arterial: 53.7 mmHg — ABNORMAL HIGH (ref 32.0–48.0)
pH, Arterial: 7.41 (ref 7.350–7.450)
pO2, Arterial: 144 mmHg — ABNORMAL HIGH (ref 83.0–108.0)
pO2, Arterial: 126 mmHg — ABNORMAL HIGH (ref 83.0–108.0)

## 2016-05-06 LAB — BASIC METABOLIC PANEL
Anion gap: 9 (ref 5–15)
BUN: 19 mg/dL (ref 6–20)
CHLORIDE: 103 mmol/L (ref 101–111)
CO2: 28 mmol/L (ref 22–32)
CREATININE: 0.9 mg/dL (ref 0.61–1.24)
Calcium: 9 mg/dL (ref 8.9–10.3)
GFR calc Af Amer: >60 mL/min (ref >60)
GFR calc non Af Amer: >60 mL/min (ref >60)
Glucose, Bld: 106 mg/dL — ABNORMAL HIGH (ref 65–99)
Potassium: 4.1 mmol/L (ref 3.5–5.1)
Sodium: 140 mmol/L (ref 135–145)

## 2016-05-06 LAB — CBC
HEMATOCRIT: 42.5 % (ref 39.0–52.0)
HEMATOCRIT: 40.3 % (ref 39.0–52.0)
HEMOGLOBIN: 13.8 g/dL (ref 13.0–17.0)
Hemoglobin: 13 g/dL (ref 13.0–17.0)
MCH: 30.5 pg (ref 26.0–34.0)
MCH: 30.2 pg (ref 26.0–34.0)
MCHC: 32.5 g/dL (ref 30.0–36.0)
MCHC: 32.3 g/dL (ref 30.0–36.0)
MCV: 93.8 fL (ref 78.0–100.0)
MCV: 93.7 fL (ref 78.0–100.0)
PLATELETS: 232 10*3/uL (ref 150–400)
Platelets: 245 10*3/uL (ref 150–400)
RBC: 4.53 MIL/uL (ref 4.22–5.81)
RBC: 4.3 MIL/uL (ref 4.22–5.81)
RDW: 14.8 % (ref 11.5–15.5)
RDW: 14.5 % (ref 11.5–15.5)
WBC: 9.5 10*3/uL (ref 4.0–10.5)
WBC: 10.3 10*3/uL (ref 4.0–10.5)

## 2016-05-06 LAB — GLUCOSE, CAPILLARY
GLUCOSE-CAPILLARY: 92 mg/dL (ref 65–99)
GLUCOSE-CAPILLARY: 136 mg/dL — AB (ref 65–99)
Glucose-Capillary: 101 mg/dL — ABNORMAL HIGH (ref 65–99)
Glucose-Capillary: 109 mg/dL — ABNORMAL HIGH (ref 65–99)
Glucose-Capillary: 98 mg/dL (ref 65–99)

## 2016-05-06 LAB — TYPE AND SCREEN
ABO/RH(D): O POS
Antibody Screen: NEGATIVE

## 2016-05-06 LAB — HEPARIN LEVEL (UNFRACTIONATED): Heparin Unfractionated: 0.5 IU/mL (ref 0.30–0.70)

## 2016-05-06 LAB — PROTIME-INR
INR: 1.13
Prothrombin Time: 14.5 seconds (ref 11.4–15.2)

## 2016-05-06 LAB — APTT: aPTT: 38 seconds — ABNORMAL HIGH (ref 24–36)

## 2016-05-06 SURGERY — IMPLANTATION, AORTIC VALVE, TRANSCATHETER, FEMORAL APPROACH
Anesthesia: General

## 2016-05-06 MED ORDER — IODIXANOL 320 MG/ML IV SOLN
INTRAVENOUS | Status: DC | PRN
  Administered 2016-05-06: 140.8 mL via INTRAVENOUS

## 2016-05-06 MED ORDER — EPHEDRINE SULFATE-NACL 50-0.9 MG/10ML-% IV SOSY
PREFILLED_SYRINGE | INTRAVENOUS | Status: DC | PRN
  Administered 2016-05-06: 10 mg via INTRAVENOUS
  Administered 2016-05-06: 20 mg via INTRAVENOUS
  Administered 2016-05-06: 5 mg via INTRAVENOUS

## 2016-05-06 MED ORDER — NITROGLYCERIN IN D5W 200-5 MCG/ML-% IV SOLN
0.0000 ug/min | INTRAVENOUS | Status: DC
  Administered 2016-05-06: 5 ug/min via INTRAVENOUS

## 2016-05-06 MED ORDER — ROCURONIUM BROMIDE 100 MG/10ML IV SOLN
INTRAVENOUS | Status: DC | PRN
  Administered 2016-05-06: 10 mg via INTRAVENOUS
  Administered 2016-05-06: 50 mg via INTRAVENOUS
  Administered 2016-05-06: 10 mg via INTRAVENOUS

## 2016-05-06 MED ORDER — PROPOFOL 10 MG/ML IV BOLUS
INTRAVENOUS | Status: AC
  Filled 2016-05-06: qty 20

## 2016-05-06 MED ORDER — PROPOFOL 10 MG/ML IV BOLUS
INTRAVENOUS | Status: DC | PRN
  Administered 2016-05-06: 100 mg via INTRAVENOUS

## 2016-05-06 MED ORDER — OXYCODONE HCL 5 MG PO TABS: 5.0000 mg | ORAL_TABLET | ORAL | Status: DC | PRN

## 2016-05-06 MED ORDER — ASPIRIN 81 MG PO CHEW
324.0000 mg | CHEWABLE_TABLET | Freq: Every day | ORAL | Status: DC
  Filled 2016-05-06: qty 4

## 2016-05-06 MED ORDER — MORPHINE SULFATE (PF) 2 MG/ML IV SOLN: 2.0000 mg | INTRAVENOUS | Status: DC | PRN

## 2016-05-06 MED ORDER — ACETAMINOPHEN 650 MG RE SUPP: 650.0000 mg | Freq: Once | RECTAL | Status: DC

## 2016-05-06 MED ORDER — SODIUM CHLORIDE 0.9 % IV SOLN
30.0000 meq | Freq: Once | INTRAVENOUS | Status: AC
  Administered 2016-05-06: 30 meq via INTRAVENOUS
  Filled 2016-05-06: qty 15

## 2016-05-06 MED ORDER — HEPARIN SODIUM (PORCINE) 1000 UNIT/ML IJ SOLN
INTRAMUSCULAR | Status: AC
  Filled 2016-05-06: qty 1

## 2016-05-06 MED ORDER — SUGAMMADEX SODIUM 500 MG/5ML IV SOLN
INTRAVENOUS | Status: DC | PRN
  Administered 2016-05-06: 300 mg via INTRAVENOUS

## 2016-05-06 MED ORDER — TRAMADOL HCL 50 MG PO TABS: 50.0000 mg | ORAL_TABLET | ORAL | Status: DC | PRN

## 2016-05-06 MED ORDER — CEFUROXIME SODIUM 1.5 G IJ SOLR
1.5000 g | Freq: Two times a day (BID) | INTRAMUSCULAR | Status: AC
  Administered 2016-05-07 – 2016-05-08 (×4): 1.5 g via INTRAVENOUS
  Filled 2016-05-06 (×4): qty 1.5

## 2016-05-06 MED ORDER — SODIUM CHLORIDE 0.9 % IV SOLN
0.0000 ug/min | INTRAVENOUS | Status: DC
  Filled 2016-05-06: qty 2

## 2016-05-06 MED ORDER — PROTAMINE SULFATE 10 MG/ML IV SOLN
INTRAVENOUS | Status: AC
  Filled 2016-05-06: qty 25

## 2016-05-06 MED ORDER — SODIUM CHLORIDE 0.9 % IV SOLN
INTRAVENOUS | Status: DC
  Filled 2016-05-06 (×2): qty 2.5

## 2016-05-06 MED ORDER — LIDOCAINE 2% (20 MG/ML) 5 ML SYRINGE
INTRAMUSCULAR | Status: AC
  Filled 2016-05-06: qty 5

## 2016-05-06 MED ORDER — SODIUM CHLORIDE 0.9 % IJ SOLN
INTRAMUSCULAR | Status: AC
  Filled 2016-05-06: qty 10

## 2016-05-06 MED ORDER — MAGNESIUM SULFATE 4 GM/100ML IV SOLN
4.0000 g | Freq: Once | INTRAVENOUS | Status: AC
  Administered 2016-05-06: 4 g via INTRAVENOUS
  Filled 2016-05-06: qty 100

## 2016-05-06 MED ORDER — PHENYLEPHRINE 40 MCG/ML (10ML) SYRINGE FOR IV PUSH (FOR BLOOD PRESSURE SUPPORT)
PREFILLED_SYRINGE | INTRAVENOUS | Status: AC
  Filled 2016-05-06: qty 10

## 2016-05-06 MED ORDER — DEXTROSE 5 % IV SOLN
1.5000 g | Freq: Two times a day (BID) | INTRAVENOUS | Status: DC
  Administered 2016-05-06: 1.5 g via INTRAVENOUS

## 2016-05-06 MED ORDER — ALBUMIN HUMAN 5 % IV SOLN: INTRAVENOUS | Status: DC | PRN

## 2016-05-06 MED ORDER — ROCURONIUM BROMIDE 50 MG/5ML IV SOSY
PREFILLED_SYRINGE | INTRAVENOUS | Status: AC
  Filled 2016-05-06: qty 5

## 2016-05-06 MED ORDER — DEXMEDETOMIDINE HCL IN NACL 400 MCG/100ML IV SOLN
INTRAVENOUS | Status: DC | PRN
  Administered 2016-05-06: .3 ug/kg/h via INTRAVENOUS

## 2016-05-06 MED ORDER — LIDOCAINE HCL (CARDIAC) 20 MG/ML IV SOLN
INTRAVENOUS | Status: DC | PRN
  Administered 2016-05-06: 100 mg via INTRAVENOUS

## 2016-05-06 MED ORDER — VANCOMYCIN HCL IN DEXTROSE 1-5 GM/200ML-% IV SOLN
1000.0000 mg | Freq: Once | INTRAVENOUS | Status: AC
  Administered 2016-05-06: 1000 mg via INTRAVENOUS
  Filled 2016-05-06: qty 200

## 2016-05-06 MED ORDER — CHLORHEXIDINE GLUCONATE 0.12 % MT SOLN
15.0000 mL | OROMUCOSAL | Status: AC
  Administered 2016-05-06: 15 mL via OROMUCOSAL
  Filled 2016-05-06: qty 15

## 2016-05-06 MED ORDER — MIDAZOLAM HCL 2 MG/2ML IJ SOLN: 2.0000 mg | INTRAMUSCULAR | Status: DC | PRN

## 2016-05-06 MED ORDER — ACETAMINOPHEN 500 MG PO TABS
1000.0000 mg | ORAL_TABLET | Freq: Four times a day (QID) | ORAL | Status: DC
  Administered 2016-05-07 (×2): 1000 mg via ORAL
  Filled 2016-05-06 (×2): qty 2

## 2016-05-06 MED ORDER — PROTAMINE SULFATE 10 MG/ML IV SOLN
INTRAVENOUS | Status: DC | PRN
  Administered 2016-05-06 (×2): 50 mg via INTRAVENOUS
  Administered 2016-05-06: 20 mg via INTRAVENOUS
  Administered 2016-05-06: 30 mg via INTRAVENOUS

## 2016-05-06 MED ORDER — ACETAMINOPHEN 160 MG/5ML PO SOLN: 1000.0000 mg | Freq: Four times a day (QID) | ORAL | Status: DC

## 2016-05-06 MED ORDER — FENTANYL CITRATE (PF) 100 MCG/2ML IJ SOLN
INTRAMUSCULAR | Status: DC | PRN
  Administered 2016-05-06: 100 ug via INTRAVENOUS
  Administered 2016-05-06: 150 ug via INTRAVENOUS

## 2016-05-06 MED ORDER — ACETAMINOPHEN 160 MG/5ML PO SOLN: 650.0000 mg | Freq: Once | ORAL | Status: DC

## 2016-05-06 MED ORDER — INSULIN REGULAR BOLUS VIA INFUSION
0.0000 [IU] | Freq: Three times a day (TID) | INTRAVENOUS | Status: DC
  Filled 2016-05-06: qty 10

## 2016-05-06 MED ORDER — ALBUMIN HUMAN 5 % IV SOLN
250.0000 mL | INTRAVENOUS | Status: DC | PRN
  Filled 2016-05-06: qty 250

## 2016-05-06 MED ORDER — ONDANSETRON HCL 4 MG/2ML IJ SOLN
INTRAMUSCULAR | Status: DC | PRN
  Administered 2016-05-06: 4 mg via INTRAVENOUS

## 2016-05-06 MED ORDER — FAMOTIDINE IN NACL 20-0.9 MG/50ML-% IV SOLN
20.0000 mg | Freq: Two times a day (BID) | INTRAVENOUS | Status: AC
  Administered 2016-05-06: 20 mg via INTRAVENOUS
  Filled 2016-05-06: qty 50

## 2016-05-06 MED ORDER — MORPHINE SULFATE (PF) 2 MG/ML IV SOLN: 1.0000 mg | INTRAVENOUS | Status: DC | PRN

## 2016-05-06 MED ORDER — VANCOMYCIN HCL IN DEXTROSE 1-5 GM/200ML-% IV SOLN
1000.0000 mg | Freq: Once | INTRAVENOUS | Status: AC
  Administered 2016-05-06: 1500 mg via INTRAVENOUS

## 2016-05-06 MED ORDER — HEPARIN SODIUM (PORCINE) 1000 UNIT/ML IJ SOLN
INTRAMUSCULAR | Status: DC | PRN
  Administered 2016-05-06: 15000 [IU] via INTRAVENOUS

## 2016-05-06 MED ORDER — NOREPINEPHRINE BITARTRATE 1 MG/ML IV SOLN
0.0000 ug/min | INTRAVENOUS | Status: DC
  Filled 2016-05-06: qty 4

## 2016-05-06 MED ORDER — PANTOPRAZOLE SODIUM 40 MG PO TBEC
40.0000 mg | DELAYED_RELEASE_TABLET | Freq: Every day | ORAL | Status: DC
  Administered 2016-05-07 – 2016-05-09 (×3): 40 mg via ORAL
  Filled 2016-05-06 (×3): qty 1

## 2016-05-06 MED ORDER — ASPIRIN EC 325 MG PO TBEC
325.0000 mg | DELAYED_RELEASE_TABLET | Freq: Every day | ORAL | Status: DC
  Administered 2016-05-07: 325 mg via ORAL
  Filled 2016-05-06: qty 1

## 2016-05-06 MED ORDER — ONDANSETRON HCL 4 MG/2ML IJ SOLN: 4.0000 mg | Freq: Four times a day (QID) | INTRAMUSCULAR | Status: DC | PRN

## 2016-05-06 MED ORDER — SODIUM CHLORIDE 0.9 % IV SOLN
1.0000 mL/kg/h | INTRAVENOUS | Status: AC
  Administered 2016-05-06: 1 mL/kg/h via INTRAVENOUS

## 2016-05-06 MED ORDER — 0.9 % SODIUM CHLORIDE (POUR BTL) OPTIME
TOPICAL | Status: DC | PRN
  Administered 2016-05-06: 4000 mL

## 2016-05-06 MED ORDER — NOREPINEPHRINE BITARTRATE 1 MG/ML IV SOLN
INTRAVENOUS | Status: DC | PRN
  Administered 2016-05-06: 2 ug/min via INTRAVENOUS

## 2016-05-06 MED ORDER — FENTANYL CITRATE (PF) 250 MCG/5ML IJ SOLN
INTRAMUSCULAR | Status: AC
  Filled 2016-05-06: qty 5

## 2016-05-06 MED ORDER — LACTATED RINGERS IV SOLN: 500.0000 mL | Freq: Once | INTRAVENOUS | Status: DC | PRN

## 2016-05-06 MED ORDER — ATROPINE SULFATE 1 MG/10ML IJ SOSY
PREFILLED_SYRINGE | INTRAMUSCULAR | Status: AC
  Filled 2016-05-06: qty 10

## 2016-05-06 MED ORDER — DEXMEDETOMIDINE HCL IN NACL 200 MCG/50ML IV SOLN: 0.1000 ug/kg/h | INTRAVENOUS | Status: DC

## 2016-05-06 MED ORDER — INSULIN ASPART 100 UNIT/ML ~~LOC~~ SOLN
0.0000 [IU] | SUBCUTANEOUS | Status: DC
  Administered 2016-05-06 – 2016-05-09 (×3): 2 [IU] via SUBCUTANEOUS

## 2016-05-06 MED FILL — Heparin Sodium (Porcine) Inj 1000 Unit/ML: INTRAMUSCULAR | Qty: 30 | Status: AC

## 2016-05-06 MED FILL — Potassium Chloride Inj 2 mEq/ML: INTRAVENOUS | Qty: 40 | Status: AC

## 2016-05-06 MED FILL — Magnesium Sulfate Inj 50%: INTRAMUSCULAR | Qty: 10 | Status: AC

## 2016-05-06 SURGICAL SUPPLY — 97 items
ADAPTER UNIV SWAN GANZ BIP (ADAPTER) ×1 IMPLANT
ADAPTER UNV SWAN GANZ BIP (ADAPTER) ×2
BAG BANDED W/RUBBER/TAPE 36X54 (MISCELLANEOUS) ×3 IMPLANT
BAG DECANTER FOR FLEXI CONT (MISCELLANEOUS) IMPLANT
BAG SNAP BAND KOVER 36X36 (MISCELLANEOUS) ×6 IMPLANT
BLADE OSCILLATING /SAGITTAL (BLADE) IMPLANT
BLADE STERNUM SYSTEM 6 (BLADE) IMPLANT
BLADE SURG ROTATE 9660 (MISCELLANEOUS) ×3 IMPLANT
CABLE PACING FASLOC BIEGE (MISCELLANEOUS) ×3 IMPLANT
CABLE PACING FASLOC BLUE (MISCELLANEOUS) IMPLANT
CANNULA FEM VENOUS REMOTE 22FR (CANNULA) IMPLANT
CANNULA OPTISITE PERFUSION 16F (CANNULA) IMPLANT
CANNULA OPTISITE PERFUSION 18F (CANNULA) IMPLANT
CATH DIAG EXPO 6F VENT PIG 145 (CATHETERS) ×6 IMPLANT
CATH EXPO 5FR AL1 (CATHETERS) ×3 IMPLANT
CATH S G BIP PACING (SET/KITS/TRAYS/PACK) ×6 IMPLANT
CLIP TI MEDIUM 24 (CLIP) IMPLANT
CLIP TI WIDE RED SMALL 24 (CLIP) IMPLANT
CONT SPEC 4OZ CLIKSEAL STRL BL (MISCELLANEOUS) ×9 IMPLANT
COVER BACK TABLE 24X17X13 BIG (DRAPES) ×3 IMPLANT
COVER DOME SNAP 22 D (MISCELLANEOUS) ×3 IMPLANT
COVER MAYO STAND STRL (DRAPES) IMPLANT
COVER TABLE BACK 60X90 (DRAPES) ×3 IMPLANT
CRADLE DONUT ADULT HEAD (MISCELLANEOUS) ×3 IMPLANT
DERMABOND ADVANCED (GAUZE/BANDAGES/DRESSINGS) ×2
DERMABOND ADVANCED .7 DNX12 (GAUZE/BANDAGES/DRESSINGS) ×1 IMPLANT
DEVICE CLOSURE PERCLS PRGLD 6F (VASCULAR PRODUCTS) ×2 IMPLANT
DRAPE INCISE IOBAN 66X45 STRL (DRAPES) IMPLANT
DRAPE SLUSH MACHINE 52X66 (DRAPES) ×3 IMPLANT
DRAPE TABLE COVER HEAVY DUTY (DRAPES) ×3 IMPLANT
DRSG TEGADERM 4X4.75 (GAUZE/BANDAGES/DRESSINGS) ×3 IMPLANT
ELECT REM PT RETURN 9FT ADLT (ELECTROSURGICAL) ×6
ELECTRODE REM PT RTRN 9FT ADLT (ELECTROSURGICAL) ×2 IMPLANT
FELT TEFLON 6X6 (MISCELLANEOUS) IMPLANT
FEMORAL VENOUS CANN RAP (CANNULA) IMPLANT
GAUZE SPONGE 4X4 12PLY STRL (GAUZE/BANDAGES/DRESSINGS) ×3 IMPLANT
GLOVE BIO SURGEON STRL SZ8 (GLOVE) IMPLANT
GLOVE BIOGEL PI IND STRL 6.5 (GLOVE) ×4 IMPLANT
GLOVE BIOGEL PI INDICATOR 6.5 (GLOVE) ×8
GLOVE EUDERMIC 7 POWDERFREE (GLOVE) ×3 IMPLANT
GLOVE ORTHO TXT STRL SZ7.5 (GLOVE) IMPLANT
GOWN STRL REUS W/ TWL LRG LVL3 (GOWN DISPOSABLE) ×4 IMPLANT
GOWN STRL REUS W/ TWL XL LVL3 (GOWN DISPOSABLE) ×6 IMPLANT
GOWN STRL REUS W/TWL LRG LVL3 (GOWN DISPOSABLE) ×8
GOWN STRL REUS W/TWL XL LVL3 (GOWN DISPOSABLE) ×12
GUIDEWIRE SAF TJ AMPL .035X180 (WIRE) ×3 IMPLANT
GUIDEWIRE SAFE TJ AMPLATZ EXST (WIRE) ×3 IMPLANT
GUIDEWIRE STRAIGHT .035 260CM (WIRE) ×3 IMPLANT
INSERT FOGARTY 61MM (MISCELLANEOUS) IMPLANT
INSERT FOGARTY SM (MISCELLANEOUS) IMPLANT
INSERT FOGARTY XLG (MISCELLANEOUS) IMPLANT
KIT BASIN OR (CUSTOM PROCEDURE TRAY) ×3 IMPLANT
KIT DILATOR VASC 18G NDL (KITS) IMPLANT
KIT HEART LEFT (KITS) ×3 IMPLANT
KIT ROOM TURNOVER OR (KITS) ×3 IMPLANT
KIT SUCTION CATH 14FR (SUCTIONS) IMPLANT
NEEDLE PERC 18GX7CM (NEEDLE) ×3 IMPLANT
NS IRRIG 1000ML POUR BTL (IV SOLUTION) ×15 IMPLANT
PACK AORTA (CUSTOM PROCEDURE TRAY) ×3 IMPLANT
PAD ARMBOARD 7.5X6 YLW CONV (MISCELLANEOUS) ×6 IMPLANT
PAD ELECT DEFIB RADIOL ZOLL (MISCELLANEOUS) ×3 IMPLANT
PERCLOSE PROGLIDE 6F (VASCULAR PRODUCTS) ×6
SET MICROPUNCTURE 5F STIFF (MISCELLANEOUS) ×6 IMPLANT
SHEATH AVANTI 11CM 8FR (MISCELLANEOUS) ×3 IMPLANT
SHEATH PINNACLE 6F 10CM (SHEATH) ×6 IMPLANT
SLEEVE REPOSITIONING LENGTH 30 (MISCELLANEOUS) ×3 IMPLANT
SPONGE GAUZE 4X4 12PLY STER LF (GAUZE/BANDAGES/DRESSINGS) ×3 IMPLANT
SPONGE LAP 4X18 X RAY DECT (DISPOSABLE) IMPLANT
STOPCOCK MORSE 400PSI 3WAY (MISCELLANEOUS) ×24 IMPLANT
SUT ETHIBOND X763 2 0 SH 1 (SUTURE) IMPLANT
SUT ETHILON 3 0 PS 1 (SUTURE) ×3 IMPLANT
SUT GORETEX CV 4 TH 22 36 (SUTURE) IMPLANT
SUT GORETEX CV4 TH-18 (SUTURE) IMPLANT
SUT GORETEX TH-18 36 INCH (SUTURE) IMPLANT
SUT PROLENE 3 0 SH1 36 (SUTURE) IMPLANT
SUT PROLENE 4 0 RB 1 (SUTURE)
SUT PROLENE 4-0 RB1 .5 CRCL 36 (SUTURE) IMPLANT
SUT PROLENE 5 0 C 1 36 (SUTURE) IMPLANT
SUT PROLENE 6 0 C 1 30 (SUTURE) IMPLANT
SUT SILK  1 MH (SUTURE) ×4
SUT SILK 1 MH (SUTURE) ×2 IMPLANT
SUT SILK 2 0 SH CR/8 (SUTURE) IMPLANT
SUT VIC AB 2-0 CT1 27 (SUTURE)
SUT VIC AB 2-0 CT1 TAPERPNT 27 (SUTURE) IMPLANT
SUT VIC AB 2-0 CTX 36 (SUTURE) IMPLANT
SUT VIC AB 3-0 SH 8-18 (SUTURE) IMPLANT
SYR 30ML LL (SYRINGE) ×6 IMPLANT
SYR 50ML LL SCALE MARK (SYRINGE) ×2 IMPLANT
SYRINGE 10CC LL (SYRINGE) ×9 IMPLANT
SYRINGE 60CC LL (MISCELLANEOUS) ×3 IMPLANT
TOWEL OR 17X26 10 PK STRL BLUE (TOWEL DISPOSABLE) ×3 IMPLANT
TRANSDUCER W/STOPCOCK (MISCELLANEOUS) ×6 IMPLANT
TRAY FOLEY IC TEMP SENS 16FR (CATHETERS) ×3 IMPLANT
TUBING HIGH PRESSURE 120CM (CONNECTOR) ×3 IMPLANT
VALVE HEART TRANSCATH SZ3 23MM (Prosthesis & Implant Heart) ×3 IMPLANT
WIRE AMPLATZ SS-J .035X180CM (WIRE) ×3 IMPLANT
WIRE BENTSON .035X145CM (WIRE) ×3 IMPLANT

## 2016-05-06 NOTE — H&P (View-Only) (Signed)
   Patient ID: Jorge [name] MRN: [id number] DOB/AGE: 01/04/1946 70 y.o.  Admit date: 04/28/2016 Referring Physician: Nishan Primary Physician: Nathan Conroy, PA-C Primary Cardiologist: Nishan Reason for Consultation: Severe aortic stenosis  HPI: 70 yo male with history of atrial fibrillation on Eliquis, HTN, HLD, prior CVA, obesity, OSA, CAD and aortic stenosis who was admitted to [facility] after several syncopal events. He has been followed as an outpatient by Dr. Nishan and is known to have aortic stenosis. Admitted 04/28/16 after syncopal event while standing in the bathroom. He became dizzy and felt lightheaded and passed out. Troponin minimally elevated. Cardiac cath 04/30/16 with mild non-obstructive disease in the Circumflex artery. Echo 04/29/16 with normal LV systolic function, severe AS with thickened AV leaflets with restricted motion, mean gradient 41 mmHg, 70 mmHg. He has had a prior syncopal event several months back while walking his dog. Neck CTA this admission with possible left ICA dissection, non flow limiting. He has been seen by Vascular surgery and no changes made. He has been seen by Dr. Bartle with CT surgery and while AVR is indicated, he is felt to be a better candidate for TAVR. He has been seen by Dr. Kalinski and has undergone dental extraction surgery today.   He tells me today that he feels tired. No chest pain or dyspnea. He is in bed. No recurrent syncope since admission.    Past Medical History:  Diagnosis Date  . Atrial fibrillation (HCC)    pt on Eliquis  . Chest pain   . CHF (congestive heart failure) (HCC)   . Edema   . HTN (hypertension)   . Hyperlipidemia   . Neuropathy (HCC)   . Obesity   . SOB (shortness of breath)   . Stroke (HCC)     Family History  Problem Relation Age of Onset  . Heart attack Mother   . Stroke Father     Social History   Social History  . Marital status: Divorced    Spouse name: N/A  . Number of children: N/A  .  Years of education: N/A   Occupational History  . Not on file.   Social History Main Topics  . Smoking status: Former Smoker  . Smokeless tobacco: Former User    Quit date: 03/22/1996  . Alcohol use No  . Drug use: No  . Sexual activity: Not on file   Other Topics Concern  . Not on file   Social History Narrative  . No narrative on file    Past Surgical History:  Procedure Laterality Date  . BACK SURGERY    . CARDIAC CATHETERIZATION    . CARDIAC CATHETERIZATION N/A 04/30/2016   Procedure: Right/Left Heart Cath and Coronary Angiography;  Surgeon: Christopher D McAlhany, MD;  Location: [facility] INVASIVE CV LAB;  Service: Cardiovascular;  Laterality: N/A;  . CAROTID ANGIOGRAM N/A 05/30/2014   Procedure: CERBRAL  ANGIOGRAM;  Surgeon: Vance W Brabham, MD;  Location: [facility] CATH LAB;  Service: Cardiovascular;  Laterality: N/A;    Allergies  Allergen Reactions  . Coumadin [Warfarin Sodium] Rash    Hospital medications:  Scheduled Meds: . ALPRAZolam  0.5 mg Oral QHS  . aminocaproic acid  10 mL Oral Q1H  . atorvastatin  40 mg Oral q1800  . buPROPion  450 mg Oral Daily  . Chlorhexidine Gluconate Cloth  6 each Topical Daily  . feeding supplement  1 Container Oral TID WC  . feeding supplement (PRO-STAT SUGAR FREE 64)  30 mL Oral   TID BM  . gabapentin  800 mg Oral TID  . mouth rinse  15 mL Mouth Rinse BID  . multivitamin with minerals  1 tablet Oral QHS  . mupirocin ointment  1 application Nasal BID  . sertraline  150 mg Oral Daily  . sodium chloride flush  3 mL Intravenous Q12H  . sodium chloride flush  3 mL Intravenous Q12H   Continuous Infusions: . diltiazem (CARDIZEM) infusion 5 mg/hr (05/02/16 1300)  . lactated ringers    . lactated ringers     PRN Meds:.sodium chloride, acetaminophen **OR** acetaminophen, alum & mag hydroxide-simeth, bisacodyl, morphine injection, ondansetron **OR** ondansetron (ZOFRAN) IV, senna-docusate, sodium chloride flush   Review of systems complete and  found to be negative unless listed above    Physical Exam: Blood pressure 110/88, pulse 73, temperature 99 F (37.2 C), resp. rate 18, height 6' 2" (1.88 m), weight (!) 313 lb 6.4 oz (142.2 kg), SpO2 98 %.    General: Obese WM, NAD  HEENT: OP clear, mucus membranes moist  SKIN: warm, dry. No rashes.  Neuro: No focal deficits  Musculoskeletal: Muscle strength 5/5 all ext  Psychiatric: Mood and affect normal  Neck: No JVD, no carotid bruits, no thyromegaly, no lymphadenopathy.  Lungs:Clear bilaterally, no wheezes, rhonci, crackles  Cardiovascular: Regular rate and rhythm. Harsh systolic murmur. No gallops or rubs.  Abdomen:Soft. Bowel sounds present. Non-tender.  Extremities: No lower extremity edema. Pulses are 2 + in the bilateral DP/PT.  Echo 04/29/16: Left ventricle: The cavity size was normal. Wall thickness was   increased in a pattern of moderate LVH. Systolic function was   normal. The estimated ejection fraction was in the range of 55%   to 60%. Wall motion was normal; there were no regional wall   motion abnormalities. Doppler parameters are consistent with   abnormal left ventricular relaxation (grade 1 diastolic   dysfunction). Doppler parameters are consistent with high   ventricular filling pressure. - Aortic valve: There was severe stenosis. There was trivial   regurgitation. Aortic valve:   Trileaflet; moderately calcified leaflets. Doppler:   There was severe stenosis.   There was trivial regurgitation.    VTI ratio of LVOT to aortic valve: 0.39. Indexed valve area (VTI): 0.48 cm^2/m^2. Peak velocity ratio of LVOT to aortic valve: 0.34. Indexed valve area (Vmax): 0.42 cm^2/m^2. Mean velocity ratio of LVOT to aortic valve: 0.37. Indexed valve area (Vmean): 0.46 cm^2/m^2.    Mean gradient (S): 41 mm Hg. Peak gradient (S): 70 mm Hg. - Left atrium: The atrium was moderately dilated. - Pulmonary arteries: Systolic pressure was mildly increased. PA   peak pressure: 42  mm Hg (S).  Impressions:  - Technically difficult; definity used; normal LV systolic   function; grade 1 diastolic dysfunction with elevated LV filling   pressure; moderate LVH; calcified aortic valve with severe AS;   moderate LAE; mildly elevated pulmonary pressure.  Right/Left Heart Cath and Coronary Angiography  Conclusion     Ost Cx to Prox Cx lesion, 40 %stenosed.  There is severe aortic valve stenosis.  Hemodynamic findings consistent with aortic valve stenosis.  1. Mild non-obstructive CAD 2. Severe aortic stenosis (mean gradient 33.7mmHg, AVA 1.02 cm2).   Recommendations: It is likely that his syncope is due to his aortic stenosis. I will ask the primary team to call CT surgery in the am. He may not be a good candidate for surgical AVR. Dr. Owen or Dr. Bartle should be asked to see him so   they can discuss AVR vs TAVR. If he is not felt to be a surgical candidate, I will be glad to see him in consultation as well to discuss TAVR.    Indications   Severe aortic stenosis [I35.0 (ICD-10-CM)]  Procedural Details/Technique   Technical Details Indication: 70 yo male with severe AS, syncope.   Procedure: The risks, benefits, complications, treatment options, and expected outcomes were discussed with the patient. The patient and/or family concurred with the proposed plan, giving informed consent. The patient was brought to the cath lab after IV hydration was begun and oral premedication was given. The patient was further sedated with Versed and Fentanyl. The right groin was prepped and draped in the usual manner. Using the modified Seldinger access technique, a 5 French sheath was placed in the right femoral artery and a 7 French sheath was placed in the right femoral vein. A balloon tipped catheter was used to perform a right heart catheterization. Standard diagnostic catheters were used to perform selective coronary angiography. There was difficulty engaging the native RCA.  Non-selective angiography was performed with a 3DRC catheter placed near the ostium of the vessel. I crossed the aortic valve with an AL-1 and a straight wire. LV pressures measured.   There were no immediate complications. The patient was taken to the recovery area in stable condition.    Estimated blood loss <50 mL.  During this procedure the patient was administered the following to achieve and maintain moderate conscious sedation: Versed 2 mg, Morphine 100 mg, while the patient's heart rate, blood pressure, and oxygen saturation were continuously monitored. The period of conscious sedation was 50 minutes, of which I was present face-to-face 100% of this time.    Complications   Complications documented before study signed (04/30/2016 6:46 PM EST)    RIGHT/LEFT HEART CATH AND CORONARY ANGIOGRAPHY   None Documented by Christopher D McAlhany, MD 04/30/2016 6:27 PM EST  Time Range: Intra-procedure      Coronary Findings   Dominance: Right  Left Anterior Descending  Vessel is large.  First Diagonal Branch  Vessel is small in size.  Second Diagonal Branch  Vessel is moderate in size.  Third Diagonal Branch  Vessel is small in size.  Ramus Intermedius  Vessel is small.  Left Circumflex  Ost Cx to Prox Cx lesion, 40% stenosed. The lesion is eccentric.  First Obtuse Marginal Branch  Vessel is moderate in size.  Second Obtuse Marginal Branch  Vessel is moderate in size.  Third Obtuse Marginal Branch  Vessel is moderate in size.  Right Coronary Artery  Vessel is large.  Right Posterior Descending Artery  Vessel is moderate in size.  Right Heart   Right Heart Pressures Hemodynamic findings consistent with aortic valve stenosis. Elevated LV EDP consistent with volume overload.    Left Heart   Aortic Valve There is severe aortic valve stenosis.    Coronary Diagrams   Diagnostic Diagram     Implants        No implant documentation for this case.  PACS  Images   Show images for Cardiac catheterization   Link to Procedure Log   Procedure Log    Hemo Data   Flowsheet Row Most Recent Value  Fick Cardiac Output 6.38 L/min  Fick Cardiac Output Index 2.46 (L/min)/BSA  Aortic Mean Gradient 33.7 mmHg  Aortic Peak Gradient 27 mmHg  Aortic Valve Area 1.02  Aortic Value Area Index 0.39 cm2/BSA  RA A Wave 7 mmHg  RA V   Wave 4 mmHg  RA Mean 3 mmHg  RV Systolic Pressure 38 mmHg  RV Diastolic Pressure 1 mmHg  RV EDP 6 mmHg  PA Systolic Pressure 33 mmHg  PA Diastolic Pressure 1 mmHg  PA Mean 21 mmHg  PW A Wave 16 mmHg  PW V Wave 8 mmHg  PW Mean 8 mmHg  AO Systolic Pressure 155 mmHg  AO Diastolic Pressure 71 mmHg  AO Mean 99 mmHg  LV Systolic Pressure 189 mmHg  LV Diastolic Pressure 4 mmHg  LV EDP 18 mmHg  Arterial Occlusion Pressure Extended Systolic Pressure 161 mmHg  Arterial Occlusion Pressure Extended Diastolic Pressure 73 mmHg  Arterial Occlusion Pressure Extended Mean Pressure 110 mmHg  Left Ventricular Apex Extended Systolic Pressure 188 mmHg  Left Ventricular Apex Extended Diastolic Pressure 2 mmHg  Left Ventricular Apex Extended EDP Pressure 17 mmHg  QP/QS 1  TPVR Index 8.53 HRUI  TSVR Index 40.16 HRUI  PVR SVR Ratio 0.14  TPVR/TSVR Ratio 0.21    CT ANGIO CHEST PE W OR WO CONTRAST (Accession [id number]) (Order [id number])  Imaging  Date: 04/28/2016 Department: [facility] 3E CHF Released By/Authorizing: Joshua Geiple, PA-C (auto-released)  Exam Information   Status Exam Begun  Exam Ended   Final [99] 04/29/2016 3:25 AM 04/29/2016 3:47 AM  PACS Images   Show images for CT ANGIO CHEST PE W OR WO CONTRAST  Study Result   CLINICAL DATA: 70-year-old male with elevated D-dimer.  EXAM: CT ANGIOGRAPHY CHEST WITH CONTRAST  TECHNIQUE: Multidetector CT imaging of the chest was performed using the standard protocol during bolus administration of intravenous contrast. Multiplanar CT image  reconstructions and MIPs were obtained to evaluate the vascular anatomy.  CONTRAST: 100 cc Isovue 370  COMPARISON: Chest radiograph dated 04/28/2016  FINDINGS: Cardiovascular: There is mild cardiomegaly. No pericardial effusion. Mild coronary vascular calcification predominantly involving the LAD. Mild atherosclerotic calcification of the thoracic aorta. No aneurysmal dilatation or evidence of dissection. The origins of the great vessels of the aortic arch appear patent. Evaluation of the pulmonary arteries is limited due to suboptimal opacification of the distal branches. No large central pulmonary artery embolus identified.  Mediastinum/Nodes: There is no hilar or mediastinal adenopathy. The esophagus is grossly unremarkable.  Lungs/Pleura: Right lung base subsegmental atelectatic changes noted. Infiltrate is less likely but not excluded. Clinical correlation is recommended. There is no pleural effusion or pneumothorax. The central airways are patent.  Upper Abdomen: There is diffuse fatty infiltration of the liver. Slight irregularity of the hepatic contour concerning for cirrhosis. There is mild eventration of the right hemidiaphragm.  Musculoskeletal: Mild degenerative changes of the spine. No acute fracture.  Review of the MIP images confirms the above findings.  IMPRESSION: No CT evidence of central pulmonary artery embolus.  Mild elevation of the right hemidiaphragm with right lung base subsegmental atelectasis/ scarring. Infiltrate is less likely. Clinical correlation is recommended.    Labs:   Lab Results  Component Value Date   WBC 8.2 05/02/2016   HGB 13.0 05/02/2016   HCT 39.9 05/02/2016   MCV 93.9 05/02/2016   PLT 262 05/02/2016    Recent Labs Lab 04/28/16 1209  05/02/16 0432  NA 138  < > 137  K 3.7  < > 3.4*  CL 103  < > 103  CO2 26  < > 27  BUN 11  < > 8  CREATININE 0.99  < > 0.94  CALCIUM 8.9  < > 8.5*  PROT 7.3  --   --       BILITOT 0.5  --   --   ALKPHOS 219*  --   --   ALT 17  --   --   AST 21  --   --   GLUCOSE 132*  < > 107*  < > = values in this interval not displayed. Lab Results  Component Value Date   CKTOTAL 148 04/28/2016   TROPONINI <0.03 04/30/2016    EKG: NSR, LAFB, LVH  ASSESSMENT AND PLAN:   1. Severe aortic valve stenosis: He has stage D symptomatic aortic valve stenosis. It is most likely that his syncopal events are due to his AS. I have personally reviewed the echo images. The aortic valve is thickened, calcified with limited leaflet mobility. I think he would benefit from AVR. Given his morbid obesity, I think he is a better candidate for TAVR than surgical AVR. I have reviewed the TAVR procedure in detail today with the patient. He would like to proceed with planning for TAVR. He has been seen by Dr. Bartle with CT surgery who also thinks TAVR is the best option for AVR. Dental extraction today per Dr. Kalinski. His CT scans have been ordered (Cardiac CT to size valve and assess aortic root, CT chest/abd/pelvis to assess aorta and iliac arteries for access). Completing his CT scans in preparation for TAVR may be challenging given the cast on his left arm. Ortho should be called to see this weekend to see if we can remove his cast in order to be able to raise his hands above his head for CT scans. Will continue to follow. If we are able to complete his scans this weekend, we could try and arrange his TAVR procedure on Tuesday 05/06/16.    Signed: Christopher McAlhany, MD 05/02/2016, 5:52 PM 

## 2016-05-06 NOTE — Transfer of Care (Signed)
Immediate Anesthesia Transfer of Care Note  Patient: Jorge DroughtGeorge O [name]  Procedure(s) Performed: Procedure(s): TRANSCATHETER AORTIC VALVE REPLACEMENT, TRANSFEMORAL using a 23mm Edwards Sapien 3 Transcatheter Heart Valve (N/A) TRANSESOPHAGEAL ECHOCARDIOGRAM (TEE) (N/A)  Patient Location: ICU  Anesthesia Type:General  Level of Consciousness: awake, alert , oriented and patient cooperative  Airway & Oxygen Therapy: Patient Spontanous Breathing and Patient connected to face mask oxygen  Post-op Assessment: Report given to RN, Post -op Vital signs reviewed and stable and Patient moving all extremities  Post vital signs: Reviewed and stable  Last Vitals:  Vitals:   05/06/16 1325 05/06/16 1330  BP:    Pulse: (!) 0 (!) 0  Resp:    Temp:      Last Pain:  Vitals:   05/06/16 0908  TempSrc: Oral  PainSc:       Patients Stated Pain Goal: 2 (05/05/16 0850)  Complications: No apparent anesthesia complications

## 2016-05-06 NOTE — Progress Notes (Signed)
PROGRESS NOTE    Jorge [name]  VHQ:[id number] DOB: 1945-10-24 DOA: 04/28/2016 PCP: [name], PA-C   Chief Complaint  Patient presents with  . Loss of Consciousness    Brief Narrative:  HPI on 04/28/2016 by Ms. [name], PA Jorge [name] is a 70 y.o. male with a history of paroxysmal atrial fibrillation on Eliquis , DCHF,  AS,  hypertension, hyperlipidemia, obey CT, prior history of stroke, carotid artery disease, brought to the ED after a syncopal episode. In review, she was shaving around 8:30 this morning, when he turned his head to the right, accompanied by some prodromal symptoms including nausea, and "shinning lights:, falling an hitting his head against the bathtub. He had lost consciousness for several minutes, but remained on the floor till 11 am, when he was found by his caretaker and EMS was called. Patient reported increased anxiety a the time, and complains of vague chest pain, without palpitations. He reports increasing shortness of breath without cough. He denies any jaw pain or left arm pain. He reports having similar dizzy spells over the last year but no presyncopal or syncopal events. No dysarthria or dysphagia. Denies any unilateral weakness or sensory deficiencies.  Denies any confusion. Of note, patient has trouble finding words since his last stroke. No seizures reported.  Denies any fever or chills, or night sweats. Denies any abdominal pain, or diarrhea. Denies any sick contacts or new foods. Denies any recent long distance trips. No recent surgeries. Denies worsening lower extremity swelling. Denies abnormal skin rashes, or neuropathy. Compliant with  medications. Does not smoke.  Assessment & Plan   Hypotension -possibly one cause of patient's syncope (along with aortic stenosis) -Unclear etiology for hypotension.  -upon admission denied fever, cough, chest pain, UR symptoms, no abdominal pain. Had some diarrhea few days ago which has resolved.    -Cardiology consulted and appreciated -Echocardiogram 04/29/2016: EF 55-60%, grade 1 diastolic dysfunction. Severe AS -Cefepime started empirically given that patient also had leukocytosis- discontinued 05/01/2016  Syncope  -D-Dimer 3.28, EKG unrevealing.  -CT head negative for acute intracranial abnormalities. -CT angio chest negative for central PE.  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  Widely patent vertebral arteries within the neck. There is a irregular curvilinear filling defect within the proximal left ICA, likely a small dissection flap (series 7, image 210). -Due to abnormal CT angio neck, vascular surgery consulted and appreciated  Severe aortic stenosis -Possibly cause of patient's syncope. -Noted on Echocardiogram and cardiac cath -CT surgery consulted and appreciated -CTA chest/abd/pelvis: severe thickening and calcification of the aortic valve. Aortic atherosclerosis, left main and LAD CAD.  -Plan for TAVR today 05/06/16  ?Left ICA dissection -CTA neck as above -Vascular surgery consulted and did not recommend further acute intervention at this time. Patient already on anticoagulation  -s/p L CEA 2014- follows with Dr. Darrick Penna   Dental caries -Dentistry consulted, s/p multiple tooth extractions 1/26  Elevated troponin/NSTEMI -Echo as above -R/LHC: mild non-obstructive CAD, severe aortic stenosis (mean gradient 33.74mmHg)  Hypertension  -Hold BP medications as patient was hypotensive.   Paroxysmal Atrial Fibrillation  -CHA2DS2-VASc score 6  -Continue anticoagulation, Eliquis held due to Cath and surgery   -Was placed on heparin- but was discontinued yesterday after cath. -Given current Afib, heparin held for dental surgery -Continue  heparin  Hyperlipidemia -Continue statin  Peripheral Neuropathy -Continue Neurontin   Depression -Continue home Zoloft   Generalized weakness -PT and OT consulted- rec SNF  Constipation -continue bowel regimen -Resolved  Headache -Possibly related to lack of caffeine as patient drinks 3-4 cups of coffee per day -Resolved with caffeine.  Left humeral fracture -present on admission. Secondary to fall -Cast was placed on 12/20- patient sees Dr. Loralie Champagne at [facility] ortho -Called the office on 1/26, cast is supposed to removed on 05/20/16 -Left humeral xray: no change in displaced, angulated comminuted left humeral fracture  DVT Prophylaxis  Heparin   Code Status: Full  Family Communication: none at bedside  Disposition Plan: Admitted.  TAVR today 05/06/16  Consultants Cardiology Vascular surgery  Cardiothoracic surgery  Procedures  Echocardiogram R/LHC  Antibiotics   Anti-infectives    Start     Dose/Rate Route Frequency Ordered Stop   05/06/16 0400  vancomycin (VANCOCIN) 1,500 mg in sodium chloride 0.9 % 250 mL IVPB     1,500 mg 125 mL/hr over 120 Minutes Intravenous To Surgery 05/05/16 1653 05/07/16 0400   05/06/16 0400  cefUROXime (ZINACEF) 1.5 g in dextrose 5 % 50 mL IVPB     1.5 g 100 mL/hr over 30 Minutes Intravenous To Surgery 05/05/16 1653 05/07/16 0400   05/06/16 0400  cefUROXime (ZINACEF) 750 mg in dextrose 5 % 50 mL IVPB     750 mg 100 mL/hr over 30 Minutes Intravenous To Surgery 05/05/16 1652 05/07/16 0400   05/02/16 1015  ceFAZolin (ANCEF) 3 g in dextrose 5 % 50 mL IVPB     3 g 130 mL/hr over 30 Minutes Intravenous  Once 05/01/16 1602 05/02/16 1137   04/29/16 2200  vancomycin (VANCOCIN) IVPB 750 mg/150 ml premix  Status:  Discontinued     750 mg 150 mL/hr over 60 Minutes Intravenous Every 12 hours 04/29/16 0939 05/01/16 1021   04/29/16 0930  ceFEPIme (MAXIPIME) 2 g in dextrose 5 % 50 mL IVPB  Status:  Discontinued     2 g 100 mL/hr over 30  Minutes Intravenous Every 12 hours 04/29/16 0859 05/01/16 1021   04/29/16 0930  vancomycin (VANCOCIN) IVPB 1000 mg/200 mL premix     1,000 mg 200 mL/hr over 60 Minutes Intravenous  Once 04/29/16 0859 04/29/16 1143      Subjective:   Jorge [name] seen and examined today. Patient states he is feeling better today. Denies further headache or constipation. Awaiting his surgery today. Denies chest pain, shortness of breath, abdominal pain, nausea or vomiting, diarrhea.   Objective:   Vitals:   05/05/16 1959 05/06/16 0551 05/06/16 0810 05/06/16 0908  BP: 117/66 118/88  (!) 130/57  Pulse: 69 (!) 58 (!) 54 (!) 105  Resp: 18 18  17   Temp: 98.7 F (37.1 C) 97.5 F (36.4 C)  97.7 F (36.5 C)  TempSrc: Oral Oral  Oral  SpO2: 94% 95%  95%  Weight:  (!) 141.5 kg (312 lb)    Height:        Intake/Output Summary (Last 24 hours) at 05/06/16 1032 Last data filed at 05/06/16 0951  Gross per 24 hour  Intake              483 ml  Output             2900 ml  Net            -2417 ml   Filed Weights   05/04/16 0430 05/05/16 0645 05/06/16 0551  Weight: Marland Kitchen)  142 kg (313 lb) (!) 142.2 kg (313 lb 6.4 oz) (!) 141.5 kg (312 lb)    Exam  General: Well developed, well nourished, NAD  HEENT: NCAT,mucous membranes moist.    Cardiovascular: S1 S2 auscultated,3/6SEM-harsh, irregular  Respiratory: Clear to auscultation bilaterally with equal chest rise  Abdomen: Soft, obese, nontender, nondistended, + bowel sounds  Extremities: warm dry without cyanosis clubbing. Trace LE edema. LUE in cast  Neuro: AAOx3, nonfocal  Psych: Normal affect and demeanor, pleasant    Data Reviewed: I have personally reviewed following labs and imaging studies  CBC:  Recent Labs Lab 05/02/16 0432 05/03/16 0413 05/04/16 0508 05/05/16 0257 05/06/16 0334  WBC 8.2 12.9* 10.4 8.6 9.5  HGB 13.0 13.4 13.2 13.0 13.8  HCT 39.9 41.1 41.0 40.7 42.5  MCV 93.9 93.0 94.7 94.9 93.8  PLT 262 303 253 255 245   Basic  Metabolic Panel:  Recent Labs Lab 05/02/16 0432 05/03/16 0413 05/04/16 0508 05/05/16 0257 05/06/16 0334  NA 137 137 139 141 140  K 3.4* 3.9 3.8 4.0 4.1  CL 103 103 103 103 103  CO2 27 26 28 28 28   GLUCOSE 107* 139* 104* 96 106*  BUN 8 10 16 20 19   CREATININE 0.94 0.87 0.89 0.89 0.90  CALCIUM 8.5* 8.7* 8.7* 9.0 9.0   GFR: Estimated Creatinine Clearance: 114.4 mL/min (by C-G formula based on SCr of 0.9 mg/dL). Liver Function Tests: No results for input(s): AST, ALT, ALKPHOS, BILITOT, PROT, ALBUMIN in the last 168 hours. No results for input(s): LIPASE, AMYLASE in the last 168 hours. No results for input(s): AMMONIA in the last 168 hours. Coagulation Profile: No results for input(s): INR, PROTIME in the last 168 hours. Cardiac Enzymes:  Recent Labs Lab 04/29/16 1145 04/29/16 1653 04/29/16 2325 04/30/16 0547 04/30/16 1104  TROPONINI <0.03 <0.03 <0.03 <0.03 <0.03   BNP (last 3 results) No results for input(s): PROBNP in the last 8760 hours. HbA1C: No results for input(s): HGBA1C in the last 72 hours. CBG:  Recent Labs Lab 05/03/16 0531 05/03/16 0614 05/05/16 0640 05/06/16 0534 05/06/16 0911  GLUCAP 140* 135* 91 109* 98   Lipid Profile: No results for input(s): CHOL, HDL, LDLCALC, TRIG, CHOLHDL, LDLDIRECT in the last 72 hours. Thyroid Function Tests: No results for input(s): TSH, T4TOTAL, FREET4, T3FREE, THYROIDAB in the last 72 hours. Anemia Panel: No results for input(s): VITAMINB12, FOLATE, FERRITIN, TIBC, IRON, RETICCTPCT in the last 72 hours. Urine analysis:    Component Value Date/Time   COLORURINE YELLOW 04/29/2016 1233   APPEARANCEUR CLEAR 04/29/2016 1233   LABSPEC 1.021 04/29/2016 1233   PHURINE 6.0 04/29/2016 1233   GLUCOSEU NEGATIVE 04/29/2016 1233   HGBUR SMALL (A) 04/29/2016 1233   BILIRUBINUR NEGATIVE 04/29/2016 1233   KETONESUR NEGATIVE 04/29/2016 1233   PROTEINUR NEGATIVE 04/29/2016 1233   NITRITE NEGATIVE 04/29/2016 1233   LEUKOCYTESUR  NEGATIVE 04/29/2016 1233   Sepsis Labs: @LABRCNTIP (procalcitonin:4,lacticidven:4)  ) Recent Results (from the past 240 hour(s))  Culture, blood (routine x 2)     Status: None   Collection Time: 04/29/16  9:20 AM  Result Value Ref Range Status   Specimen Description BLOOD RIGHT ARM  Final   Special Requests IN PEDIATRIC BOTTLE  2CC  Final   Culture NO GROWTH 5 DAYS  Final   Report Status 05/04/2016 FINAL  Final  Culture, blood (routine x 2)     Status: None   Collection Time: 04/29/16  9:27 AM  Result Value Ref Range Status   Specimen  Description BLOOD RIGHT HAND  Final   Special Requests IN PEDIATRIC BOTTLE  3 CC  Final   Culture NO GROWTH 5 DAYS  Final   Report Status 05/04/2016 FINAL  Final  Urine culture     Status: None   Collection Time: 04/29/16 12:33 PM  Result Value Ref Range Status   Specimen Description URINE, CLEAN CATCH  Final   Special Requests NONE  Final   Culture NO GROWTH  Final   Report Status 04/30/2016 FINAL  Final  Surgical pcr screen     Status: Abnormal   Collection Time: 05/01/16  7:47 PM  Result Value Ref Range Status   MRSA, PCR NEGATIVE NEGATIVE Final   Staphylococcus aureus POSITIVE (A) NEGATIVE Final    Comment:        The Xpert SA Assay (FDA approved for NASAL specimens in patients over 71 years of age), is one component of a comprehensive surveillance program.  Test performance has been validated by [facility] for patients greater than or equal to 557 year old. It is not intended to diagnose infection nor to guide or monitor treatment.       Radiology Studies: No results found.   Scheduled Meds: . [MAR Hold] ALPRAZolam  0.5 mg Oral QHS  . [MAR Hold] amiodarone  200 mg Oral BID  . [MAR Hold] atorvastatin  40 mg Oral q1800  . [MAR Hold] buPROPion  450 mg Oral Daily  . cefUROXime (ZINACEF)  IV  1.5 g Intravenous To OR  . cefUROXime (ZINACEF)  IV  750 mg Intravenous To OR  . [MAR Hold] Chlorhexidine Gluconate Cloth  6 each Topical  Daily  . dexmedetomidine  0.1-0.7 mcg/kg/hr Intravenous To OR  . diazepam  5 mg Oral Once  . DOPamine  0-10 mcg/kg/min Intravenous To OR  . epinephrine  0-10 mcg/min Intravenous To OR  . [MAR Hold] feeding supplement (ENSURE ENLIVE)  237 mL Oral Q24H  . [MAR Hold] feeding supplement (PRO-STAT SUGAR FREE 64)  30 mL Oral TID BM  . [MAR Hold] gabapentin  800 mg Oral TID  . heparin 30,000 units/NS 1000 mL solution for CELLSAVER   Other To OR  . insulin (NOVOLIN-R) infusion   Intravenous To OR  . magnesium sulfate  40 mEq Other To OR  . [MAR Hold] mouth rinse  15 mL Mouth Rinse BID  . metoprolol tartrate  12.5 mg Oral Once  . [MAR Hold] metoprolol tartrate  25 mg Oral BID  . [MAR Hold] multivitamin with minerals  1 tablet Oral QHS  . [MAR Hold] mupirocin ointment  1 application Nasal BID  . nitroGLYCERIN  2-200 mcg/min Intravenous To OR  . norepinephrine (LEVOPHED) Adult infusion  0-10 mcg/min Intravenous To OR  . phenylephrine 20mg /27250mL NS (0.08mg /ml) infusion  30-200 mcg/min Intravenous To OR  . potassium chloride  80 mEq Other To OR  . [MAR Hold] sertraline  150 mg Oral Daily  . [MAR Hold] sodium chloride flush  3 mL Intravenous Q12H  . [MAR Hold] sodium chloride flush  3 mL Intravenous Q12H  . tranexamic acid (CYKLOKAPRON) infusion ([facility])  1.5 mg/kg/hr Intravenous To OR  . tranexamic acid  15 mg/kg Intravenous To OR  . tranexamic acid  2 mg/kg Intracatheter To OR  . vancomycin  1,500 mg Intravenous To OR   Continuous Infusions: . heparin Stopped (05/06/16 0910)  . lactated ringers Stopped (05/04/16 1810)  . lactated ringers       LOS: 7 days  Time Spent in minutes   30 minutes  Ladanian Kelter D.O. on 05/06/2016 at 10:32 AM  Between 7am to 7pm - Pager - [phone]  After 7pm go to [email or web address] - password [email or web address]  And look for the night coverage person covering for me after hours  [facility] Office  [phone]

## 2016-05-06 NOTE — Anesthesia Preprocedure Evaluation (Signed)
Anesthesia Evaluation  Patient identified by MRN, date of birth, ID band Patient awake    Reviewed: Allergy & Precautions, NPO status , Patient's Chart, lab work & pertinent test results  Airway Mallampati: II  TM Distance: >3 FB Neck ROM: Full    Dental   Pulmonary former smoker,    Pulmonary exam normal        Cardiovascular hypertension, + CAD and + Past MI  Normal cardiovascular exam+ Valvular Problems/Murmurs AS      Neuro/Psych    GI/Hepatic   Endo/Other    Renal/GU      Musculoskeletal   Abdominal   Peds  Hematology   Anesthesia Other Findings   Reproductive/Obstetrics                             Anesthesia Physical Anesthesia Plan  ASA: IV  Anesthesia Plan: General   Post-op Pain Management:    Induction: Intravenous  Airway Management Planned: Oral ETT  Additional Equipment: Arterial line, CVP and Ultrasound Guidance Line Placement  Intra-op Plan:   Post-operative Plan: Extubation in OR  Informed Consent: I have reviewed the patients History and Physical, chart, labs and discussed the procedure including the risks, benefits and alternatives for the proposed anesthesia with the patient or authorized representative who has indicated his/her understanding and acceptance.     Plan Discussed with: CRNA and Surgeon  Anesthesia Plan Comments:         Anesthesia Quick Evaluation

## 2016-05-06 NOTE — Anesthesia Postprocedure Evaluation (Signed)
Anesthesia Post Note  Patient: Jorge [name]  Procedure(s) Performed: Procedure(s) (LRB): TRANSCATHETER AORTIC VALVE REPLACEMENT, TRANSFEMORAL using a 23mm Edwards Sapien 3 Transcatheter Heart Valve (N/A) TRANSESOPHAGEAL ECHOCARDIOGRAM (TEE) (N/A)  Patient location during evaluation: PACU Anesthesia Type: General Level of consciousness: awake and alert Pain management: pain level controlled Vital Signs Assessment: post-procedure vital signs reviewed and stable Respiratory status: spontaneous breathing, nonlabored ventilation, respiratory function stable and patient connected to nasal cannula oxygen Cardiovascular status: blood pressure returned to baseline and stable Postop Assessment: no signs of nausea or vomiting Anesthetic complications: no       Last Vitals:  Vitals:   05/06/16 1325 05/06/16 1330  BP:    Pulse: (!) 0 (!) 0  Resp:    Temp:      Last Pain:  Vitals:   05/06/16 0908  TempSrc: Oral  PainSc:                  Geza Beranek DAVID

## 2016-05-06 NOTE — Progress Notes (Signed)
Report called to OR, heparin stopped on call. PCR done for 1/25 for previous OR procedure. CHG bath done. Pt belongings packed. Will monitor.

## 2016-05-06 NOTE — Anesthesia Procedure Notes (Signed)
Procedure Name: Intubation Date/Time: 05/06/2016 11:52 AM Performed by: Myna Bright Pre-anesthesia Checklist: Patient identified, Emergency Drugs available, Suction available and Patient being monitored Patient Re-evaluated:Patient Re-evaluated prior to inductionOxygen Delivery Method: Circle system utilized Preoxygenation: Pre-oxygenation with 100% oxygen Intubation Type: IV induction Ventilation: Mask ventilation without difficulty Laryngoscope Size: Mac and 3 Grade View: Grade II Tube type: Oral Tube size: 7.5 mm Number of attempts: 1 Airway Equipment and Method: Stylet Placement Confirmation: ETT inserted through vocal cords under direct vision,  positive ETCO2 and breath sounds checked- equal and bilateral Secured at: 22 cm Tube secured with: Tape Dental Injury: Teeth and Oropharynx as per pre-operative assessment

## 2016-05-06 NOTE — Op Note (Signed)
[facility]   MULTIDISCIPLINARY HEART VALVE TEAM   TAVR OPERATIVE NOTE   Date of Procedure:  05/06/2016  Preoperative Diagnosis: Severe Aortic Stenosis   Postoperative Diagnosis: Same   Procedure:    Transcatheter Aortic Valve Replacement - Percutaneous Right Transfemoral Approach  Edwards Sapien 3 THV (size 23 mm, model # 9600TFX, serial # [id number])   Co-Surgeons:  Alleen BorneBryan K. Bartle, MD and Verne Carrowhristopher McAlhany, MD   Anesthesiologist:  Dr. Arta BruceKevin Ossey  Echocardiographer:  Dr. 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, atrial fibrillation on Eliquis, peripheral neuropathy, moderate aortic stenosis and congestive heart failure. In February 2016 the patient presented with left-sided weakness and loss of consciousness. His MRI was negative for acute stroke. He underwent a cerebral angiogram that revealed less than 50% right internal carotid artery stenosis and luminal irregularity at the distal left carotid artery patch. There was no significant vertebral artery stenosis. He has some weakness in the right arm and leg from his prior stroke but is able to ambulate with a cane and does take his little dog for walks. Two or three months ago he was walking his dog and his legs gave out and he fell and broke his left arm which has been in a cast since. He doesn't remember the details around that time. He has had some episodes of dizziness since and was admitted with a syncopal episode. He was shaving and noted some visual changes and lost consciousness falling in the  bathtub and hitting his head. He woke up but could not get help until meals on wheels arrived for lunch and EMS was called. His head CTA was negative for intracranial large vessel occlusion or high-grade stenosis. There was mild distal small vessel disease. There was atheromatous plaque in the carotid siphons without significant stenosis. 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 There was 70-80% short-segment stenosis of the proximal right subclavian artery. 2D echo showed a trileaflet aortic valve with moderately calcified leaflets and severe stenosis with a mean gradient of 41 mm Hg and a peak of 70 mm Hg. LV function is normal with moderate LVH. Prior echo in 01/2016 showed a mean aortic valve gradient of 28 mm Hg. Cardiac cath yesterday showed 40% ostial to proximal LCX stenosis with a mean AV gradient of 34 mm Hg.  He reports having exertional shortness of breath that has progressed over the past 6 months. He has chronic lower extremity edema that is improved with lasix. He has had some chest discomfort at times. He denies orthopnea and PND.  In summary he has stage D, severe, symptomatic aortic stenosis with progressive exertional dyspnea and fatigue ( NYHA class III), recurrent dizziness, and at least two syncopal episodes. I have personally reviewed his echo and cath films. His valve detail is difficult to see due to morbid obesity but the gradient is clearly in the severe range at 41 mm Hg. His cath shows no significant coronary artery disease with a mean gradient measured at  34 mm Hg. His CTA of the head and neck does not show any cerebrovascular cause for syncope so I think we have to assume it is due to his AS. 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 I think AVR is indicated in this  patient. He would be a high risk patient for open surgical AVR due to morbid obesity, prior stroke with disability and multiple other comorbid factors. I think TAVR would be a better option for him and give him a much better chance of making a functional recovery.   The patient was counseled at length regarding treatment alternatives for management of severe symptomatic aortic stenosis. Alternative approaches such as conventional aortic valve replacement, transcatheter aortic valve replacement, and palliative medical therapy were compared and contrasted at length. The risks associated with conventional surgical aortic valve replacement were discussed in detail, as were expectations for post-operative convalescence. Long-term prognosis with medical therapy was discussed.   We discussed complications that might develop including but not limited to risks of death, stroke, paravalvular leak, aortic dissection or other major vascular complications, aortic annulus rupture, device embolization, cardiac rupture or perforation, mitral regurgitation, acute myocardial infarction, arrhythmia, heart block or bradycardia requiring permanent pacemaker placement, congestive heart failure, respiratory failure, renal failure, pneumonia, infection, other late complications related to structural valve deterioration or migration, or other complications that might ultimately cause a temporary or permanent loss of functional independence or other long term morbidity. He would like to proceed with workup for TAVR.     DETAILS OF THE OPERATIVE PROCEDURE  PREPARATION:    The patient is brought to the operating room on the above mentioned date and central monitoring was established by the anesthesia team including placement of Swan-Ganz catheter and radial arterial line. The patient is placed in the supine position on the operating table.  Intravenous antibiotics are administered. General endotracheal anesthesia is induced  uneventfully.  A Foley catheter is placed.  Baseline transesophageal echocardiogram was performed. This showed mild to moderate AI. The patient's chest, abdomen, both groins, and both lower extremities are prepared and draped in a sterile manner. A time out procedure is performed.  We reviewed his studies and thought that it would be best to size the annulus with a 23 mm balloon since the cardiac CT was not a great study and the annular size by CT measured to a 23 mm Sapien 3 valve with his BSA of 2.8.    Peripheral and transfemoral access was performed by Dr. Clifton James and will be included in his note.     BALLOON AORTIC VALVULOPLASTY:   Balloon aortic valvuloplasty was performed using a 23 mm valvuloplasty balloon.  Once optimal position was achieved, BAV was done under rapid ventricular pacing. There was just a wiff of AI around the balloon and therefore we felt that it would be best to use a 23 mm valve and add 1 cc of volume to it. The balloon seemed to fill up the aortic root and we did not feel that a 26 mm valve would fit. The patient recovered well hemodynamically.    TRANSCATHETER HEART VALVE DEPLOYMENT:   An Edwards Sapien 3 transcatheter heart valve (size 23 mm, model #9600TFX, serial #[id number]) was prepared and crimped per manufacturer's guidelines, and the proper orientation of the valve is confirmed on the [employer] delivery system. The valve was advanced through the introducer sheath using normal  technique until in an appropriate position in the abdominal aorta beyond the sheath tip. The balloon was then retracted and using the fine-tuning wheel was centered on the valve. The valve was then advanced across the aortic arch using appropriate flexion of the catheter. The valve was carefully positioned across the aortic valve annulus. The Commander catheter was retracted using normal technique. Once final position of the valve has been confirmed by angiographic assessment, the  valve is deployed while temporarily holding ventilation and during rapid ventricular pacing to maintain systolic blood pressure < 50 mmHg and pulse pressure < 10 mmHg. The balloon inflation is held for >3 seconds after reaching full deployment volume. Once the balloon has fully deflated the balloon is retracted into the ascending aorta and valve function is assessed using echocardiography. There is felt to be mild paravalvular leak and no central aortic insufficiency.  A post-dilation with an additional 1 cc of volume in the balloon was performed. This resulted in trivial AI. The patient's hemodynamic recovery following valve deployment is good.  The deployment balloon and guidewire are both removed. Echo demostrated acceptable post-procedural gradients, stable mitral valve function, and trivial aortic insufficiency.  PROCEDURE COMPLETION:   The sheath was removed and femoral artery closure performed by Dr Clifton James. Please see his separate report for details. Protamine was administered once femoral arterial repair was complete. The temporary pacemaker, pigtail catheters and femoral sheaths were removed with manual pressure used for hemostasis.   The patient tolerated the procedure well and is transported to the surgical intensive care in stable condition. There were no immediate intraoperative complications. All sponge instrument and needle counts are verified correct at completion of the operation.   No blood products were administered during the operation.  The patient received a total of 140 mL of intravenous contrast during the procedure.   Alleen Borne, MD 05/06/2016 3:43 PM

## 2016-05-06 NOTE — Progress Notes (Signed)
ANTICOAGULATION CONSULT NOTE  Pharmacy Consult for Heparin Indication: chest pain/ACS  Allergies  Allergen Reactions  . Coumadin [Warfarin Sodium] Rash    Patient Measurements: Height: 6\' 2"  (188 cm) Weight: (!) 312 lb (141.5 kg) IBW/kg (Calculated) : 82.2 Heparin Dosing Weight: 115.4 kg  Vital Signs: Temp: 97.5 F (36.4 C) (01/30 0551) Temp Source: Oral (01/30 0551) BP: 118/88 (01/30 0551) Pulse Rate: 58 (01/30 0551)  Labs:  Recent Labs  05/04/16 0508 05/04/16 1516 05/05/16 0257 05/06/16 0334  HGB 13.2  --  13.0 13.8  HCT 41.0  --  40.7 42.5  PLT 253  --  255 245  HEPARINUNFRC 0.69 0.50 0.45 0.50  CREATININE 0.89  --  0.89 0.90    Estimated Creatinine Clearance: 114.4 mL/min (by C-G formula based on SCr of 0.9 mg/dL).    Medications:  Infusions:  . heparin 1,800 Units/hr (05/05/16 2105)  . lactated ringers Stopped (05/04/16 1810)  . lactated ringers      Assessment: 71 yo male with history of Afib on eliquis PTA presents with syncope. Pharmacy consulted to dose heparin for ACS/chest pain, cath showed non-obstructive CAD and severe aortic stenosis. Plan is TAVR 05/06/16.   Heparin was resumed 1/27. Heparin level is therapeutic at 0.5 on 1800 units/hr. No bleeding noted, CBC is stable.  Goal of Therapy:  Heparin level 0.3-0.7 units/ml Monitor platelets by anticoagulation protocol: Yes   Plan:  -Continue heparin drip at 1800 units/hr -Daily heparin level, CBC -F/u post-op anticoag plans  Lysle Pearlachel Magin Balbi, PharmD, BCPS Pager # [phone] 05/06/2016 8:35 AM

## 2016-05-06 NOTE — Anesthesia Procedure Notes (Signed)
Central Venous Catheter Insertion Performed by: Arta BruceSSEY, Makye Radle, anesthesiologist Start/End1/30/2018 9:35 AM, 05/06/2016 9:45 AM Patient location: Pre-op. Preanesthetic checklist: patient identified, IV checked, site marked, risks and benefits discussed, surgical consent, monitors and equipment checked, pre-op evaluation, timeout performed and anesthesia consent Lidocaine 1% used for infiltration and patient sedated Hand hygiene performed  and maximum sterile barriers used  Catheter size: 8 Fr Total catheter length 16. Central line was placed.Double lumen Procedure performed using ultrasound guided technique. Ultrasound Notes:image(s) printed for medical record Attempts: 1 Following insertion, dressing applied, line sutured and Biopatch. Post procedure assessment: blood return through all ports, free fluid flow and no air  Patient tolerated the procedure well with no immediate complications.

## 2016-05-06 NOTE — CV Procedure (Signed)
[facility]  TAVR OPERATIVE NOTE   Date of Procedure:  05/06/2016  Preoperative Diagnosis: Severe Aortic Stenosis   Postoperative Diagnosis: Same   Procedure:    Transcatheter Aortic Valve Replacement - Transfemoral Approach  Edwards Sapien 3 THV (size 23 mm, model # [id number], serial #[id number] )    Co-Surgeons:  Alleen Borne, MD and Verne Carrow, MD  Anesthesiologist:  Michelle Piper  Echocardiographer:  Eden Emms  Pre-operative Echo Findings:  Severe aortic stenosis  Normal left ventricular systolic function  Post-operative Echo Findings:  Trivial paravalvular leak  Normal left ventricular systolic function  BRIEF CLINICAL NOTE AND INDICATIONS FOR SURGERY  71 year old white male with history of severe aortic stenosis with preserved LV systolic function presents with syncopal falls in the recent past resulting in both head trauma with loss of consciousness and fracture of his left upper extremity. 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 Echocardiogram showed trileaflet aortic valve with calcified leaflets and severe stenosis, peak gradient of 70 mmHg. Coronary angiogram showed minimal coronary artery disease. Brain MRI was negative for an acute stroke. The patient did have a left body stroke with residual weakness in the past. It was felt that AVR was indicated  however because of his strokes, chronic A. fib and deconditioning transcatheter aortic valve replacement was recommended.  During the course of the patient's preoperative work up they have been evaluated comprehensively by a multidisciplinary team of specialists coordinated through the Multidisciplinary [facility]ve Clinic in the [facility].  They have been demonstrated to suffer from symptomatic severe aortic stenosis as noted above. The patient has been counseled extensively as  to the relative risks and benefits of all options for the treatment of severe aortic stenosis including long term medical therapy, conventional surgery for aortic valve replacement, and transcatheter aortic valve replacement.  The patient has been independently evaluated by two cardiac surgeons including Dr Laneta Simmers and Dr. Donata Clay and they are felt to be at high risk for conventional surgical aortic valve replacement. Both surgeons indicated the patient would be a poor candidate for conventional surgery because of comorbidities including age, chronic atrial fib, prior CVA and COPD.   Based upon review of all of the patient's preoperative diagnostic tests they are felt to be candidate for transcatheter aortic valve replacement using the transfemoral approach as an alternative to high risk conventional surgery.    Following the decision to proceed with transcatheter aortic valve replacement, a discussion has been held regarding what types of management strategies would be attempted intraoperatively in the event of life-threatening complications, including whether or not the patient would be considered a candidate for the use of cardiopulmonary bypass and/or conversion to open sternotomy for attempted surgical intervention.  The patient has been advised of a variety of complications that might develop peculiar to this approach including but not limited to risks of death, stroke, paravalvular leak, aortic dissection or other major vascular complications, aortic annulus rupture, device embolization, cardiac rupture or perforation, acute myocardial infarction, arrhythmia, heart block or bradycardia requiring permanent pacemaker placement, congestive heart failure, respiratory failure, renal failure, pneumonia, infection, other late complications related to structural valve deterioration or migration, or other complications that might ultimately cause a temporary or permanent loss of functional independence or other  long term morbidity.  The patient provides full informed consent for the procedure as described and all questions were  answered preoperatively.    DETAILS OF THE OPERATIVE PROCEDURE  PREPARATION:   The patient is brought to the operating room on the above mentioned date and central monitoring was established by the anesthesia team including placement of Swan-Ganz catheter and radial arterial line. The patient is placed in the supine position on the operating table.  Intravenous antibiotics are administered. General endotracheal anesthesia is induced uneventfully. A Foley catheter is placed.  Baseline transesophageal echocardiogram was performed. The patient's chest, abdomen, both groins, and both lower extremities are prepared and draped in a sterile manner. A time out procedure is performed.   PERIPHERAL ACCESS:   Using the modified Seldinger technique, femoral arterial and venous access were obtained with placement of 6 Fr sheaths on the left side.  A pigtail diagnostic catheter was passed through the femoral arterial sheath under fluoroscopic guidance into the aortic root.  A temporary transvenous pacemaker catheter was passed through the femoral venous sheath under fluoroscopic guidance into the right ventricle.  The pacemaker was tested to ensure stable lead placement and pacemaker capture. Aortic root angiography was performed in order to determine the optimal angiographic angle for valve deployment.  TRANSFEMORAL ACCESS:  The right femoral artery was engaged with a micro-puncture catheter. Angiography of the right femoral artery through the microsheath was favorable. A J-wire was advanced into the distal aorta. An 8 French sheath was placed in the right femoral artery. The patient was heparinized systemically and ACT verified > 250 seconds.  Pre-closure with double ProGlide closure devices.   A 14 Fr transfemoral E-sheath was introduced into the right femoral artery after progressively  dilating over an Amplatz superstiff wire. An AL-1 catheter was used to direct a straight-tip exchange length wire across the native aortic valve into the left ventricle. This was exchanged out for a pigtail catheter and position was confirmed in the LV apex. Simultaneous LV and Ao pressures were recorded.  The pigtail catheter was then exchanged for an Amplatz Extra-stiff wire in the LV apex. At that point, BAV was performed using a 23 mm valvuloplasty balloon.  This was done mainly for sizing.  Once optimal position was achieved, BAV was done under rapid ventricular pacing at 180 bpm. There was no regurgitation around the balloon when inflated with simultaneous aortic root angiography. The patient recovered well hemodynamically.   TRANSCATHETER HEART VALVE DEPLOYMENT:  An Edwards Sapien 3 THV (size 23 mm) was prepared and crimped per manufacturer's guidelines, and the proper orientation of the valve is confirmed on the [employer] delivery system. The valve was advanced through the introducer sheath using normal technique until in an appropriate position in the abdominal aorta beyond the sheath tip. The balloon was then retracted and using the fine-tuning wheel was centered on the valve. The valve was then advanced across the aortic arch using appropriate flexion of the catheter. The valve was carefully positioned across the aortic valve annulus. The Commander catheter was retracted using normal technique. Once final position of the valve has been confirmed by angiographic assessment, the valve is deployed while temporarily holding ventilation and during rapid ventricular pacing to maintain systolic blood pressure < 50 mmHg and pulse pressure < 10 mmHg. The balloon inflation is held for >3 seconds after reaching full deployment volume. Once the balloon has fully deflated the balloon is retracted into the ascending aorta and valve function is assessed using TEE. There is felt to be mild paravalvular leak  and no central aortic insufficiency.  We added 1cc to the  balloon deployment system and while rapid pacing, post-dilated the valve stent once more. The patient's hemodynamic recovery following valve deployment is good.  The deployment balloon and guidewire are both removed. Echo demostrated acceptable post-procedural gradients, stable mitral valve function, and trivial AI.  PROCEDURE COMPLETION:  The sheath was then removed and the double Pro-Glide devices were completed with good hemostasis.   Protamine was administered once the sheath was removed and closure devices were complete. The temporary pacemaker was left in placed due to sinus brady. The pigtail catheters and femoral sheaths were removed with manual pressure used for hemostasis.   The patient tolerated the procedure well and is transported to the surgical intensive care in stable condition. There were no immediate intraoperative complications. All sponge instrument and needle counts are verified correct at completion of the operation.   No blood products were administered during the operation.  The patient received a total of 140 mL of intravenous contrast during the procedure.  Verne Carrowhristopher McAlhany MD 05/06/2016 2:02 PM

## 2016-05-06 NOTE — Interval H&P Note (Signed)
History and Physical Interval Note:  05/06/2016 9:35 AM  Jorge [name]  has presented today for surgery, with the diagnosis of SEVERE AS  The various methods of treatment have been discussed with the patient and family. After consideration of risks, benefits and other options for treatment, the patient has consented to  Procedure(s): TRANSCATHETER AORTIC VALVE REPLACEMENT, TRANSFEMORAL (N/A) TRANSESOPHAGEAL ECHOCARDIOGRAM (TEE) (N/A) as a surgical intervention .  The patient's history has been reviewed, patient examined, no change in status, stable for surgery.  I have reviewed the patient's chart and labs.  Questions were answered to the patient's satisfaction.     Verne Carrowhristopher Rosana Farnell

## 2016-05-06 NOTE — Progress Notes (Signed)
  Echocardiogram Echocardiogram Transesophageal has been performed.  Jorge [name] L Androw 05/06/2016, 1:40 PM

## 2016-05-07 ENCOUNTER — Inpatient Hospital Stay (HOSPITAL_COMMUNITY): Payer: Medicare Other

## 2016-05-07 ENCOUNTER — Other Ambulatory Visit: Payer: Self-pay | Admitting: *Deleted

## 2016-05-07 ENCOUNTER — Telehealth: Payer: Self-pay | Admitting: Cardiovascular Disease

## 2016-05-07 DIAGNOSIS — Z954 Presence of other heart-valve replacement: Secondary | ICD-10-CM

## 2016-05-07 DIAGNOSIS — I350 Nonrheumatic aortic (valve) stenosis: Secondary | ICD-10-CM

## 2016-05-07 DIAGNOSIS — T148XXA Other injury of unspecified body region, initial encounter: Secondary | ICD-10-CM

## 2016-05-07 LAB — ECHOCARDIOGRAM COMPLETE
Height: 74 in
WEIGHTICAEL: 5026.49 [oz_av]

## 2016-05-07 LAB — GLUCOSE, CAPILLARY
GLUCOSE-CAPILLARY: 93 mg/dL (ref 65–99)
GLUCOSE-CAPILLARY: 117 mg/dL — AB (ref 65–99)
GLUCOSE-CAPILLARY: 124 mg/dL — AB (ref 65–99)
Glucose-Capillary: 90 mg/dL (ref 65–99)
Glucose-Capillary: 115 mg/dL — ABNORMAL HIGH (ref 65–99)

## 2016-05-07 LAB — CBC
HCT: 39.4 % (ref 39.0–52.0)
HEMOGLOBIN: 12.8 g/dL — AB (ref 13.0–17.0)
MCH: 30.5 pg (ref 26.0–34.0)
MCHC: 32.5 g/dL (ref 30.0–36.0)
MCV: 94 fL (ref 78.0–100.0)
Platelets: 227 10*3/uL (ref 150–400)
RBC: 4.19 MIL/uL — AB (ref 4.22–5.81)
RDW: 14.8 % (ref 11.5–15.5)
WBC: 9.9 10*3/uL (ref 4.0–10.5)

## 2016-05-07 LAB — BASIC METABOLIC PANEL
Anion gap: 8 (ref 5–15)
BUN: 14 mg/dL (ref 6–20)
CHLORIDE: 102 mmol/L (ref 101–111)
CO2: 26 mmol/L (ref 22–32)
Calcium: 8.1 mg/dL — ABNORMAL LOW (ref 8.9–10.3)
Creatinine, Ser: 0.8 mg/dL (ref 0.61–1.24)
GFR calc Af Amer: >60 mL/min (ref >60)
GFR calc non Af Amer: >60 mL/min (ref >60)
Glucose, Bld: 96 mg/dL (ref 65–99)
POTASSIUM: 4.1 mmol/L (ref 3.5–5.1)
SODIUM: 136 mmol/L (ref 135–145)

## 2016-05-07 LAB — MAGNESIUM: MAGNESIUM: 2.8 mg/dL — AB (ref 1.7–2.4)

## 2016-05-07 NOTE — Care Management Important Message (Signed)
Important Message  Patient Details  Name: Jorge [name] MRN: [id number] Date of Birth: April 15, 1945   Medicare Important Message Given:  Yes    Kyla BalzarineShealy, Masaichi Kracht Abena 05/07/2016, 12:02 PM

## 2016-05-07 NOTE — Telephone Encounter (Signed)
New Message  [facility]

## 2016-05-07 NOTE — Progress Notes (Signed)
PROGRESS NOTE    Jorge [name]  ZOX:[id number] DOB: Aug 20, 1945 DOA: 04/28/2016 PCP: Jorge [name]   Chief Complaint  Patient presents with  . Loss of Consciousness    Brief Narrative:  HPI on 04/28/2016 by Ms. Jorge Kays, PA Jorge [name] is a 71 y.o. male with a history of paroxysmal atrial fibrillation on Eliquis , DCHF,  AS,  hypertension, hyperlipidemia, obey CT, prior history of stroke, carotid artery disease, brought to the ED after a syncopal episode. In review, she was shaving around 8:30 this morning, when he turned his head to the right, accompanied by some prodromal symptoms including nausea, and "shinning lights:, falling an hitting his head against the bathtub. He had lost consciousness for several minutes, but remained on the floor till 11 am, when he was found by his caretaker and EMS was called. Patient reported increased anxiety a the time, and complains of vague chest pain, without palpitations. He reports increasing shortness of breath without cough. He denies any jaw pain or left arm pain. He reports having similar dizzy spells over the last year but no presyncopal or syncopal events. No dysarthria or dysphagia. Denies any unilateral weakness or sensory deficiencies.  Denies any confusion. Of note, patient has trouble finding words since his last stroke. No seizures reported.  Denies any fever or chills, or night sweats. Denies any abdominal pain, or diarrhea. Denies any sick contacts or new foods. Denies any recent long distance trips. No recent surgeries. Denies worsening lower extremity swelling. Denies abnormal skin rashes, or neuropathy. Compliant with  medications. Does not smoke.  Interim history: Syncope possible related to severe aortic stenosis and underwent TAVR.   Assessment & Plan   Hypotension -possibly one cause of patient's syncope (along with aortic stenosis) -Unclear etiology for hypotension.  -upon admission denied fever, cough, chest pain,  UR symptoms, no abdominal pain. Had some diarrhea few days ago which has resolved.  -Cardiology consulted and appreciated -Echocardiogram 04/29/2016: EF 55-60%, grade 1 diastolic dysfunction. Severe AS -Cefepime started empirically given that patient also had leukocytosis- discontinued 05/01/2016  Syncope  -D-Dimer 3.28, EKG unrevealing.  -CT head negative for acute intracranial abnormalities. -CT angio chest negative for central PE.  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  Widely patent vertebral arteries within the neck. There is a irregular curvilinear filling defect within the proximal left ICA, likely a small dissection flap (series 7, image 210). -Due to abnormal CT angio neck, vascular surgery consulted and appreciated  Severe aortic stenosis -Possibly cause of patient's syncope. -Noted on Echocardiogram and cardiac cath -CT surgery consulted and appreciated -CTA chest/abd/pelvis: severe thickening and calcification of the aortic valve. Aortic atherosclerosis, left main and LAD CAD.  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 Patient already on anticoagulation  -s/p L CEA 2014- follows with Dr. Darrick Penna   Dental caries -Dentistry consulted, s/p multiple tooth extractions 1/26  Elevated troponin/NSTEMI -Echo as above -R/LHC: mild non-obstructive CAD, severe aortic stenosis (mean gradient 33.42mmHg)  Hypertension  -Hold BP medications as patient was hypotensive.   Paroxysmal Atrial Fibrillation  -CHA2DS2-VASc score 6  -Continue anticoagulation, Eliquis held due to Cath and surgery   -Continue heparin, restart eliquis  on  05/08/2016  Hyperlipidemia -Continue statin  Peripheral Neuropathy -Continue Neurontin   Depression -Continue home Zoloft   Generalized weakness -PT and OT consulted- rec SNF  Constipation -continue bowel regimen -Resolved  Headache -Possibly related to lack of caffeine as patient drinks 3-4 cups of coffee per day -Resolved with caffeine.  Left humeral fracture -present on admission. Secondary to fall -Cast was placed on 12/20- patient sees Dr. Loralie Champagne at [facility] -Called the office on 1/26, cast is supposed to removed on 05/20/16 -Left humeral xray: no change in displaced, angulated comminuted left humeral fracture  DVT Prophylaxis  Heparin   Code Status: Full  Family Communication: none at bedside  Disposition Plan: Admitted.  TAVR today 05/06/16  Consultants Cardiology Vascular surgery  Cardiothoracic surgery  Procedures  Echocardiogram [facility] TAVR  Antibiotics   Anti-infectives    Start     Dose/Rate Route Frequency Ordered Stop   05/07/16 0000  cefUROXime (ZINACEF) 1.5 g in dextrose 5 % 50 mL IVPB     1.5 g 100 mL/hr over 30 Minutes Intravenous Every 12 hours 05/06/16 1500 05/08/16 2359   05/06/16 2345  vancomycin (VANCOCIN) IVPB 1000 mg/200 mL premix     1,000 mg 200 mL/hr over 60 Minutes Intravenous  Once 05/06/16 1455 05/07/16 0019   05/06/16 2145  vancomycin (VANCOCIN) IVPB 1000 mg/200 mL premix     1,000 mg 200 mL/hr over 60 Minutes Intravenous  Once 05/06/16 1428 05/06/16 2245   05/06/16 1745  cefUROXime (ZINACEF) 1.5 g in dextrose 5 % 50 mL IVPB  Status:  Discontinued     1.5 g 100 mL/hr over 30 Minutes Intravenous Every 12 hours 05/06/16 1428 05/06/16 1500   05/06/16 0400  vancomycin (VANCOCIN) 1,500 mg in sodium chloride 0.9 % 250 mL IVPB  Status:  Discontinued     1,500 mg 125 mL/hr over 120 Minutes Intravenous To Surgery 05/05/16 1653 05/06/16 1428   05/06/16 0400  cefUROXime (ZINACEF) 1.5 g in dextrose 5 % 50 mL IVPB  Status:   Discontinued     1.5 g 100 mL/hr over 30 Minutes Intravenous To Surgery 05/05/16 1653 05/06/16 1428   05/06/16 0400  cefUROXime (ZINACEF) 750 mg in dextrose 5 % 50 mL IVPB  Status:  Discontinued     750 mg 100 mL/hr over 30 Minutes Intravenous To Surgery 05/05/16 1652 05/06/16 1428   05/02/16 1015  ceFAZolin (ANCEF) 3 g in dextrose 5 % 50 mL IVPB     3 g 130 mL/hr over 30 Minutes Intravenous  Once 05/01/16 1602 05/02/16 1137   04/29/16 2200  vancomycin (VANCOCIN) IVPB 750 mg/150 ml premix  Status:  Discontinued     750 mg 150 mL/hr over 60 Minutes Intravenous Every 12 hours 04/29/16 0939 05/01/16 1021   04/29/16 0930  ceFEPIme (MAXIPIME) 2 g in dextrose 5 % 50 mL IVPB  Status:  Discontinued     2 g 100 mL/hr over 30 Minutes Intravenous Every 12 hours 04/29/16 0859 05/01/16 1021   04/29/16 0930  vancomycin (VANCOCIN) IVPB 1000 mg/200 mL premix     1,000 mg 200 mL/hr over 60 Minutes Intravenous  Once 04/29/16 0859 04/29/16 1143      Subjective:   Dorinda Hill seen and examined today. Patient states he is feeling better today. Denies further headache or constipation. Feels hungry. Denies chest pain, shortness of breath, abdominal pain, nausea or vomiting, diarrhea.   Objective:   Vitals:   05/07/16 0500 05/07/16 0600 05/07/16 0700 05/07/16 4098  BP: 100/63  (!) 86/56 102/64  Pulse: (!) 56 (!) 59 (!) 57 60  Resp: 13 (!) 23 20 18   Temp:   98.3 F (36.8 C) 98.6 F (37 C)  TempSrc:   Oral Oral  SpO2: 96% 99% 96% 96%  Weight: (!) 142.5 kg (314 lb 2.5 oz)     Height:        Intake/Output Summary (Last 24 hours) at 05/07/16 1000 Last data filed at 05/07/16 0700  Gross per 24 hour  Intake           3278.5 ml  Output             3545 ml  Net           -266.5 ml   Filed Weights   05/05/16 0645 05/06/16 0551 05/07/16 0500  Weight: (!) 142.2 kg (313 lb 6.4 oz) (!) 141.5 kg (312 lb) (!) 142.5 kg (314 lb 2.5 oz)    Exam  General: Well developed, well nourished, NAD  HEENT:  NCAT,mucous membranes moist.    Cardiovascular: S1 S2 auscultated,3/6SEM, regular  Respiratory: Clear to auscultation bilaterally with equal chest rise  Abdomen: Soft, obese, nontender, nondistended, + bowel sounds  Extremities: warm dry without cyanosis clubbing. Trace LE edema. LUE in cast  Neuro: AAOx3, nonfocal  Psych: Normal affect and demeanor, pleasant    Data Reviewed: I have personally reviewed following labs and imaging studies  CBC:  Recent Labs Lab 05/04/16 0508 05/05/16 0257 05/06/16 0334  05/06/16 1253 05/06/16 1344 05/06/16 1440 05/06/16 1442 05/07/16 0422  WBC 10.4 8.6 9.5  --   --   --  10.3  --  9.9  HGB 13.2 13.0 13.8  < > 13.6 13.9 13.0 13.6 12.8*  HCT 41.0 40.7 42.5  < > 40.0 41.0 40.3 40.0 39.4  MCV 94.7 94.9 93.8  --   --   --  93.7  --  94.0  PLT 253 255 245  --   --   --  232  --  227  < > = values in this interval not displayed. Basic Metabolic Panel:  Recent Labs Lab 05/03/16 0413 05/04/16 0508 05/05/16 0257 05/06/16 0334 05/06/16 1158 05/06/16 1253 05/06/16 1344 05/06/16 1442 05/07/16 0422  NA 137 139 141 140 139 136 138 140 136  K 3.9 3.8 4.0 4.1 4.0 4.2 4.1 3.8 4.1  CL 103 103 103 103 99* 100* 100*  --  102  CO2 26 28 28 28   --   --   --   --  26  GLUCOSE 139* 104* 96 106* 99 149* 163* 140* 96  BUN 10 16 20 19 19 19 19   --  14  CREATININE 0.87 0.89 0.89 0.90 0.70 0.80 0.90  --  0.80  CALCIUM 8.7* 8.7* 9.0 9.0  --   --   --   --  8.1*  MG  --   --   --   --   --   --   --   --  2.8*   GFR: Estimated Creatinine Clearance: 129.2 mL/min (by C-G formula based on SCr of 0.8 mg/dL). Liver Function Tests: No results for input(s): AST, ALT, ALKPHOS, BILITOT, PROT, ALBUMIN in the last 168 hours. No results for input(s): LIPASE, AMYLASE in the last 168 hours. No results for input(s): AMMONIA in the last 168 hours. Coagulation Profile:  Recent Labs Lab 05/06/16 1440  INR 1.13   Cardiac Enzymes:  Recent Labs Lab 04/30/16 1104  TROPONINI <0.03   BNP (last 3 results) No results for input(s): PROBNP in the last 8760 hours. HbA1C: No results for input(s): HGBA1C in the last 72 hours. CBG:  Recent Labs Lab 05/06/16 1620 05/06/16 1929 05/06/16 2356 05/07/16 0344 05/07/16 0817  GLUCAP 92 136* 101* 93 90   Lipid Profile: No results for input(s): CHOL, HDL, LDLCALC, TRIG, CHOLHDL, LDLDIRECT in the last 72 hours. Thyroid Function Tests: No results for input(s): TSH, T4TOTAL, FREET4, T3FREE, THYROIDAB in the last 72 hours. Anemia Panel: No results for input(s): VITAMINB12, FOLATE, FERRITIN, TIBC, IRON, RETICCTPCT in the last 72 hours. Urine analysis:    Component Value Date/Time   COLORURINE YELLOW 04/29/2016 1233   APPEARANCEUR CLEAR 04/29/2016 1233   LABSPEC 1.021 04/29/2016 1233   PHURINE 6.0 04/29/2016 1233   GLUCOSEU NEGATIVE 04/29/2016 1233   HGBUR SMALL (A) 04/29/2016 1233   BILIRUBINUR NEGATIVE 04/29/2016 1233   KETONESUR NEGATIVE 04/29/2016 1233   PROTEINUR NEGATIVE 04/29/2016 1233   NITRITE NEGATIVE 04/29/2016 1233   LEUKOCYTESUR NEGATIVE 04/29/2016 1233   Sepsis Labs: @LABRCNTIP (procalcitonin:4,lacticidven:4)  ) Recent Results (from the past 240 hour(s))  Culture, blood (routine x 2)     Status: None   Collection Time: 04/29/16  9:20 AM  Result Value Ref Range Status   Specimen Description BLOOD RIGHT ARM  Final   Special Requests IN PEDIATRIC BOTTLE  2CC  Final   Culture NO GROWTH 5 DAYS  Final   Report Status 05/04/2016 FINAL  Final  Culture, blood (routine x 2)     Status: None   Collection Time: 04/29/16  9:27 AM  Result Value Ref Range Status   Specimen Description BLOOD RIGHT HAND  Final   Special Requests IN PEDIATRIC BOTTLE  3 CC  Final   Culture NO GROWTH 5 DAYS  Final   Report Status 05/04/2016 FINAL  Final  Urine culture     Status: None   Collection Time: 04/29/16 12:33 PM  Result Value Ref Range Status   Specimen Description URINE, CLEAN CATCH  Final   Special  Requests NONE  Final   Culture NO GROWTH  Final   Report Status 04/30/2016 FINAL  Final  Surgical pcr screen     Status: Abnormal   Collection Time: 05/01/16  7:47 PM  Result Value Ref Range Status   MRSA, PCR NEGATIVE NEGATIVE Final   Staphylococcus aureus POSITIVE (A) NEGATIVE Final    Comment:        The Xpert SA Assay (FDA approved for NASAL specimens in patients over 41 years of age), is one component of a comprehensive surveillance program.  Test performance has been validated by [facility] for patients greater than or equal to 45 year old. It is not intended to diagnose infection nor to guide or monitor treatment.       Radiology Studies: Dg Chest Port 1 View  Result Date: 05/07/2016 CLINICAL DATA:  Chest soreness. EXAM: PORTABLE CHEST 1 VIEW COMPARISON:  05/06/2016. FINDINGS: Right IJ line noted in good anatomic position. Cardiomegaly with normal pulmonary vascularity. Aortic valve prosthesis again noted. Low lung volumes with basilar atelectasis. Mild basilar infiltrates cannot be excluded. IMPRESSION: 1.  Right IJ line stable position. 2. Stable cardiomegaly.  Aortic valve prosthesis again noted. 3. Stable basilar atelectasis. Electronically Signed   By: Maisie Fus  Register   On: 05/07/2016 08:13   Dg Chest Port 1 View  Result Date: 05/06/2016 CLINICAL DATA:  Status post transcatheter aortic valve replacement. EXAM: PORTABLE CHEST 1 VIEW  COMPARISON:  Radiograph of April 29, 2016. FINDINGS: Stable cardiomediastinal silhouette. Aortic valve prosthesis is noted. No pneumothorax or pleural effusion is noted. Hypoinflation of the lungs is noted with bibasilar subsegmental atelectasis. Right internal jugular catheter is noted with distal tip in expected position of the SVC. Bony thorax is unremarkable. IMPRESSION: Aortic valve prosthesis is noted. Hypoinflation the lungs is noted with bibasilar subsegmental atelectasis. Electronically Signed   By: Lupita RaiderJames  Green Jr, M.D.   On:  05/06/2016 15:06     Scheduled Meds: . acetaminophen  1,000 mg Oral Q6H   Or  . acetaminophen (TYLENOL) oral liquid 160 mg/5 mL  1,000 mg Per Tube Q6H  . acetaminophen (TYLENOL) oral liquid 160 mg/5 mL  650 mg Per Tube Once   Or  . acetaminophen  650 mg Rectal Once  . ALPRAZolam  0.5 mg Oral QHS  . amiodarone  200 mg Oral BID  . aspirin EC  325 mg Oral Daily   Or  . aspirin  324 mg Per Tube Daily  . atorvastatin  40 mg Oral q1800  . buPROPion  450 mg Oral Daily  . cefUROXime (ZINACEF)  IV  1.5 g Intravenous Q12H  . feeding supplement (ENSURE ENLIVE)  237 mL Oral Q24H  . feeding supplement (PRO-STAT SUGAR FREE 64)  30 mL Oral TID BM  . gabapentin  800 mg Oral TID  . insulin aspart  0-24 Units Subcutaneous Q4H  . mouth rinse  15 mL Mouth Rinse BID  . metoprolol tartrate  25 mg Oral BID  . multivitamin with minerals  1 tablet Oral QHS  . pantoprazole  40 mg Oral Daily  . sertraline  150 mg Oral Daily  . sodium chloride flush  3 mL Intravenous Q12H   Continuous Infusions: . dexmedetomidine Stopped (05/06/16 1430)  . lactated ringers 0 mL/hr at 05/04/16 1810  . lactated ringers 10 mL/hr at 05/06/16 2200  . nitroGLYCERIN 0 mcg/min (05/06/16 1900)  . phenylephrine (NEO-SYNEPHRINE) Adult infusion Stopped (05/06/16 1430)     LOS: 8 days   Time Spent in minutes   30 minutes  Niyah Mamaril D.O. on 05/07/2016 at 10:00 AM  Between 7am to 7pm - Pager - [phone]  After 7pm go to [email or web address] - password [email or web address]  And look for the night coverage person covering for me after hours  [facility] Office  [phone]

## 2016-05-07 NOTE — Progress Notes (Signed)
1 Day Post-Op Procedure(s) (LRB): TRANSCATHETER AORTIC VALVE REPLACEMENT, TRANSFEMORAL using a 23mm Edwards Sapien 3 Transcatheter Heart Valve (N/A) TRANSESOPHAGEAL ECHOCARDIOGRAM (TEE) (N/A) Subjective:  No complaints. Ambulated in halls today 400 ft. No dizziness or shortness of breath.  Objective: Vital signs in last 24 hours: Temp:  [97.6 F (36.4 C)-98.6 F (37 C)] 98.6 F (37 C) (01/31 0928) Pulse Rate:  [54-71] 60 (01/31 0928) Cardiac Rhythm: Normal sinus rhythm;Heart block (01/31 0933) Resp:  [12-23] 18 (01/31 0928) BP: (86-158)/(49-82) 102/64 (01/31 0928) SpO2:  [92 %-99 %] 96 % (01/31 0928) Arterial Line BP: (81-135)/(49-70) 87/57 (01/31 0700) Weight:  [142.5 kg (314 lb 2.5 oz)] 142.5 kg (314 lb 2.5 oz) (01/31 0500)  Hemodynamic parameters for last 24 hours:    Intake/Output from previous day: 01/30 0701 - 01/31 0700 In: 3278.5 [P.O.:350; I.V.:2263.5; IV Piggyback:665] Out: 4345 [Urine:4245; Blood:100] Intake/Output this shift: No intake/output data recorded.  General appearance: alert and cooperative Neurologic: intact Heart: regular rate and rhythm, S1, S2 normal, no murmur, click, rub or gallop Lungs: clear to auscultation bilaterally Wound: groin sites ok  Lab Results:  Recent Labs  05/06/16 1440 05/06/16 1442 05/07/16 0422  WBC 10.3  --  9.9  HGB 13.0 13.6 12.8*  HCT 40.3 40.0 39.4  PLT 232  --  227   BMET:  Recent Labs  05/06/16 0334  05/06/16 1344 05/06/16 1442 05/07/16 0422  NA 140  < > 138 140 136  K 4.1  < > 4.1 3.8 4.1  CL 103  < > 100*  --  102  CO2 28  --   --   --  26  GLUCOSE 106*  < > 163* 140* 96  BUN 19  < > 19  --  14  CREATININE 0.90  < > 0.90  --  0.80  CALCIUM 9.0  --   --   --  8.1*  < > = values in this interval not displayed.  PT/INR:  Recent Labs  05/06/16 1440  LABPROT 14.5  INR 1.13   ABG    Component Value Date/Time   PHART 7.364 05/06/2016 1454   HCO3 30.8 (H) 05/06/2016 1454   TCO2 32 05/06/2016 1454    O2SAT 99.0 05/06/2016 1454   CBG (last 3)   Recent Labs  05/07/16 0817 05/07/16 1151 05/07/16 1558  GLUCAP 90 115* 117*                              *[place]*                   *[facility]*                         [address]                        [place], [place] [address]                            [phone]  ------------------------------------------------------------------- Transthoracic Echocardiography  Patient:    Teryl, Mcconaghy MR #:       [id number] Study Date: 05/07/2016 Gender:     M Age:        71 Height:     188 cm Weight:     142.5 kg BSA:  2.78 m^2 Pt. Status: Room:       2W38C   ADMITTING    Marius Ditch, Christopher  REFERRING    Clifton James, Christopher  ATTENDING    Alvira Monday  PERFORMING   [facility], Inpatient  SONOGRAPHER  Lysbeth Galas, RDCS  cc:  ------------------------------------------------------------------- LV EF: 60% -   65%  ------------------------------------------------------------------- Indications:      Aortic stenosis /insufficiency 424.1.  ------------------------------------------------------------------- History:   PMH:  Obesity.  Chest pain.  Dyspnea.  Atrial fibrillation.  Congestive heart failure.  Stroke.  ------------------------------------------------------------------- Study Conclusions  - Left ventricle: The cavity size was normal. Wall thickness was   increased in a pattern of mild LVH. Systolic function was normal.   The estimated ejection fraction was in the range of 60% to 65%. 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 No   perivalvular regurgitation. Gradient are a bit high and have   increased since OR   leaflets not well seen. Valve area (VTI): 1.57 cm^2. Valve area   (Vmax): 1.4 cm^2. Valve area (Vmean): 1.37 cm^2. - Atrial septum: No defect or patent foramen ovale was identified. - Pulmonary arteries: PA peak  pressure: 32 mm Hg (S).  ------------------------------------------------------------------- Study data:  Comparison was made to the study of 04/29/2016.  Study status:  Routine.  Procedure:  The patient reported no pain pre or post test. Transthoracic echocardiography. Image quality was fair. The study was technically difficult, as a result of body habitus. Study completion:  There were no complications. Transthoracic echocardiography.  M-mode, complete 2D, spectral Doppler, and color Doppler.  Birthdate:  Patient birthdate: 1945-11-26.  Age:  Patient is 71 yr old. old.  Sex:  Gender: male. BMI: 40.3 kg/m^2.  Blood pressure:     102/64  Patient status: Inpatient.  Study date:  Study date: 05/07/2016. Study time: 03:07 PM.  Location:  Bedside.  -------------------------------------------------------------------  ------------------------------------------------------------------- Left ventricle:  The cavity size was normal. Wall thickness was increased in a pattern of mild LVH. Systolic function was normal. The estimated ejection fraction was in the range of 60% to 65%.  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 No perivalvular regurgitation. Gradient are a bit high and have increased since OR leaflets not well seen.  Doppler:     VTI ratio of LVOT to aortic valve: 0.5. Valve area (VTI): 1.57 cm^2. Indexed valve area (VTI): 0.56 cm^2/m^2. Peak velocity ratio of LVOT to aortic valve: 0.45. Valve area (Vmax): 1.4 cm^2. Indexed valve area (Vmax): 0.5 cm^2/m^2. Mean velocity ratio of LVOT to aortic valve: 0.44. Valve area (Vmean): 1.37 cm^2. Indexed valve area (Vmean): 0.49 cm^2/m^2.    Mean gradient (S): 19 mm Hg. Peak gradient (S): 35 mm Hg.  ------------------------------------------------------------------- Aorta:  The aorta was normal, not dilated, and  non-diseased.  ------------------------------------------------------------------- Mitral valve:   Doppler:  There was trivial regurgitation.  ------------------------------------------------------------------- Left atrium:  The atrium was normal in size.  ------------------------------------------------------------------- Atrial septum:  No defect or patent foramen ovale was identified.   ------------------------------------------------------------------- Right ventricle:  The cavity size was normal. Wall thickness was normal. Systolic function was normal.  ------------------------------------------------------------------- Pulmonic valve:    Doppler:  There was mild regurgitation.  ------------------------------------------------------------------- Tricuspid valve:   Doppler:  There was mild regurgitation.  ------------------------------------------------------------------- Right atrium:  The atrium was normal in size.  ------------------------------------------------------------------- Pericardium:  The pericardium was normal in appearance.  ------------------------------------------------------------------- Systemic veins: Inferior vena cava: The  vessel was normal in size. The respirophasic diameter changes were in the normal range (>= 50%), consistent with normal central venous pressure.  ------------------------------------------------------------------- Post procedure conclusions Ascending Aorta:  - The aorta was normal, not dilated, and non-diseased.  ------------------------------------------------------------------- Measurements   Left ventricle                           Value          Reference  LV ID, ED, PLAX chordal                  46.8  mm       43 - 52  LV ID, ES, PLAX chordal                  31.8  mm       23 - 38  LV fx shortening, PLAX chordal           32    %        >=29  LV PW thickness, ED                      11.8  mm        ----------  IVS/LV PW ratio, ED                      0.97           <=1.3  Stroke volume, 2D                        97    ml       ----------  Stroke volume/bsa, 2D                    35    ml/m^2   ----------  LV e&', lateral                           4.57  cm/s     ----------  LV E/e&', lateral                         15.08          ----------  LV e&', medial                            5.55  cm/s     ----------  LV E/e&', medial                          12.41          ----------  LV e&', average                           5.06  cm/s     ----------  LV E/e&', average                         13.62          ----------    Ventricular septum                       Value          Reference  IVS  thickness, ED                        11.4  mm       ----------    LVOT                                     Value          Reference  LVOT ID, S                               20    mm       ----------  LVOT area                                3.14  cm^2     ----------  LVOT peak velocity, S                    132   cm/s     ----------  LVOT mean velocity, S                    87.8  cm/s     ----------  LVOT VTI, S                              30.8  cm       ----------  LVOT peak gradient, S                    7     mm Hg    ----------    Aortic valve                             Value          Reference  Aortic valve peak velocity, S            296   cm/s     ----------  Aortic valve mean velocity, S            201   cm/s     ----------  Aortic valve VTI, S                      61.5  cm       ----------  Aortic mean gradient, S                  19    mm Hg    ----------  Aortic peak gradient, S                  35    mm Hg    ----------  VTI ratio, LVOT/AV                       0.5            ----------  Aortic valve area, VTI                   1.57  cm^2     ----------  Aortic valve area/bsa, VTI  0.56  cm^2/m^2 ----------  Velocity ratio, peak, LVOT/AV            0.45            ----------  Aortic valve area, peak velocity         1.4   cm^2     ----------  Aortic valve area/bsa, peak              0.5   cm^2/m^2 ----------  velocity  Velocity ratio, mean, LVOT/AV            0.44           ----------  Aortic valve area, mean velocity         1.37  cm^2     ----------  Aortic valve area/bsa, mean              0.49  cm^2/m^2 ----------  velocity    Aorta                                    Value          Reference  Aortic root ID, ED                       33    mm       ----------    Left atrium                              Value          Reference  LA ID, A-P, ES                           36    mm       ----------  LA ID/bsa, A-P                           1.29  cm/m^2   <=2.2  LA volume, S                             56.4  ml       ----------  LA volume/bsa, S                         20.3  ml/m^2   ----------  LA volume, ES, 1-p A4C                   58.1  ml       ----------  LA volume/bsa, ES, 1-p A4C               20.9  ml/m^2   ----------  LA volume, ES, 1-p A2C                   54.9  ml       ----------  LA volume/bsa, ES, 1-p A2C               19.7  ml/m^2   ----------    Mitral valve                             Value  Reference  Mitral E-wave peak velocity              68.9  cm/s     ----------  Mitral A-wave peak velocity              103   cm/s     ----------  Mitral deceleration time         (H)     391   ms       150 - 230  Mitral E/A ratio, peak                   0.7            ----------    Pulmonary arteries                       Value          Reference  PA pressure, S, DP               (H)     32    mm Hg    <=30    Tricuspid valve                          Value          Reference  Tricuspid regurg peak velocity           267   cm/s     ----------  Tricuspid peak RV-RA gradient            29    mm Hg    ----------    Right atrium                             Value          Reference  RA ID, S-I, ES, A4C              (H)     56.9  mm        34 - 49  RA area, ES, A4C                         14.9  cm^2     8.3 - 19.5  RA volume, ES, A/L                       32.8  ml       ----------  RA volume/bsa, ES, A/L                   11.8  ml/m^2   ----------    Systemic veins                           Value          Reference  Estimated CVP                            3     mm Hg    ----------    Right ventricle                          Value          Reference  TAPSE  33.8  mm       ----------  RV pressure, S, DP               (H)     32    mm Hg    <=30  RV s&', lateral, S                        15.9  cm/s     ----------  Legend: (L)  and  (H)  mark values outside specified reference range.  ------------------------------------------------------------------- Prepared and Electronically Authenticated by  Charlton Haws, M.D. 2018-01-31T17:24:59  Assessment/Plan: S/P Procedure(s) (LRB): TRANSCATHETER AORTIC VALVE REPLACEMENT, TRANSFEMORAL using a 23mm Edwards Sapien 3 Transcatheter Heart Valve (N/A) TRANSESOPHAGEAL ECHOCARDIOGRAM (TEE) (N/A)  He is hemodynamically stable in sinus rhythm. Echo today looks fine. No paravalvular leak. The mean gradient was measured at 19 mm Hg but the measured gradients have been all over the place preop, intraop and postop. I am not sure what is accurate.   To start Eliquis tomorrow for atrial fib.  Continue ambulation and IS.     LOS: 8 days    Alleen Borne 05/07/2016

## 2016-05-07 NOTE — Progress Notes (Signed)
CARDIAC REHAB PHASE I   PRE:  Rate/Rhythm: 61 SR  BP:  Sitting: 126/82        SaO2: 97 RA  MODE:  Ambulation: 350 ft   POST:  Rate/Rhythm: 69  BP:  Sitting: 104/47         SaO2: 97 RA  Pt required significant assistance to transition from lying to sitting position in bed. Pt ambulated 350 ft on RA, IV, assist x1, slow, steady gait, tolerated fairly well. Pt c/o DOE, fatigue with distance, standing rest x2. Pt to recliner, pt with urinary urgency, requested to go to bathroom, pt incontinent on way to bathroom.  Assisted pt to change gown. Pt to recliner, call bell within reach. Will follow.   [id number] Joylene GrapesEmily C Rally Ouch, RN, BSN 05/07/2016 12:36 PM

## 2016-05-07 NOTE — Progress Notes (Signed)
Echocardiogram 2D Echocardiogram has been performed.  Marisue Humblelexis N Emmalise Huard 05/07/2016, 3:36 PM

## 2016-05-07 NOTE — Progress Notes (Signed)
Physical Therapy Treatment Patient Details Name: Jorge DroughtGeorge O [name] MRN: [id number] DOB: 1946/02/09 Today's Date: 05/07/2016    History of Present Illness 71 y.o.malewith a history of paroxysmal atrial fibrillation on Eliquis , DCHF, AS, hypertension, hyperlipidemia, obey CT, prior history of stroke, carotid artery disease, brought to the ED after a syncopal episode. 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    PT Comments    Pt much improved from previous treatments. Pt able to amb 400' today without AD. Pt unsteady but suspect baseline due to h/o previous stroke.   Follow Up Recommendations  Home health PT;Supervision/Assistance - 24 hour     Equipment Recommendations  None recommended by PT    Recommendations for Other Services       Precautions / Restrictions Precautions Precautions: Fall Precaution Comments: LUE cast, hx of syncope Restrictions Weight Bearing Restrictions: No    Mobility  Bed Mobility Overal bed mobility: Needs Assistance Bed Mobility: Supine to Sit     Supine to sit: Min assist     General bed mobility comments: HOB elevated; Assist needed due to inability to push through LUE  Transfers Overall transfer level: Needs assistance Equipment used: None Transfers: Sit to/from Stand Sit to Stand: Min guard         General transfer comment: min guard for safety; v/c required for stand to sit due to early intiation  Ambulation/Gait Ambulation/Gait assistance: Min guard Ambulation Distance (Feet): 400 Feet Assistive device: None Gait Pattern/deviations: Decreased stride length;Wide base of support;Step-to pattern Gait velocity: decreased Gait velocity interpretation: Below normal speed for age/gender General Gait Details: no LOB; pt complains of hip pain with ambulation; sats at >90% during activity. pt with wide base of support with very short step length and height, suspect partially partially previous stroke   Stairs            Wheelchair Mobility     Modified Rankin (Stroke Patients Only)       Balance Overall balance assessment: Needs assistance Sitting-balance support: Feet supported Sitting balance-Leahy Scale: Good     Standing balance support: No upper extremity supported Standing balance-Leahy Scale: Good                      Cognition Arousal/Alertness: Awake/alert Behavior During Therapy: WFL for tasks assessed/performed Overall Cognitive Status: Within Functional Limits for tasks assessed                      Exercises      General Comments        Pertinent Vitals/Pain Pain Assessment: No/denies pain    Home Living                      Prior Function            PT Goals (current goals can now be found in the care plan section) Acute Rehab PT Goals Patient Stated Goal: return home PT Goal Formulation: With patient Progress towards PT goals: Progressing toward goals    Frequency    Min 3X/week      PT Plan Current plan remains appropriate    Co-evaluation             End of Session Equipment Utilized During Treatment: Gait belt Activity Tolerance: Patient tolerated treatment well;Patient limited by fatigue Patient left: in chair;with call bell/phone within reach     Time: [id number] PT Time Calculation (min) (ACUTE ONLY): 14 min  Charges:  $Gait Training:  8-22 mins                    G Codes:      Iona Hansen 05-31-16, 4:55 PM   Lewis Shock, PT, DPT Pager #: [phone] Office #: [phone]

## 2016-05-07 NOTE — Progress Notes (Signed)
SUBJECTIVE: No pain or dyspnea.   Tele: sinus, 60s  BP 100/63   Pulse (!) 59   Temp 97.7 F (36.5 C) (Oral)   Resp (!) 23   Ht 6\' 2"  (1.88 m)   Wt (!) 314 lb 2.5 oz (142.5 kg)   SpO2 99%   BMI 40.34 kg/m   Intake/Output Summary (Last 24 hours) at 05/07/16 1610 Last data filed at 05/07/16 0600  Gross per 24 hour  Intake           3268.5 ml  Output             4345 ml  Net          -1076.5 ml    PHYSICAL EXAM General: Well developed, well nourished, in no acute distress. Alert and oriented x 3.  Psych:  Good affect, responds appropriately Neck: No JVD. No masses noted.  Lungs: Clear bilaterally with no wheezes or rhonci noted.  Heart: RRR with no murmurs noted. Abdomen: Bowel sounds are present. Soft, non-tender.  Extremities: No lower extremity edema. Both groin access sites stable. No hematoma  LABS: Basic Metabolic Panel:  Recent Labs  96/04/54 0334  05/06/16 1344 05/06/16 1442 05/07/16 0422  NA 140  < > 138 140 136  K 4.1  < > 4.1 3.8 4.1  CL 103  < > 100*  --  102  CO2 28  --   --   --  26  GLUCOSE 106*  < > 163* 140* 96  BUN 19  < > 19  --  14  CREATININE 0.90  < > 0.90  --  0.80  CALCIUM 9.0  --   --   --  8.1*  MG  --   --   --   --  2.8*  < > = values in this interval not displayed. CBC:  Recent Labs  05/06/16 1440 05/06/16 1442 05/07/16 0422  WBC 10.3  --  9.9  HGB 13.0 13.6 12.8*  HCT 40.3 40.0 39.4  MCV 93.7  --  94.0  PLT 232  --  227   Current Meds: . acetaminophen  1,000 mg Oral Q6H   Or  . acetaminophen (TYLENOL) oral liquid 160 mg/5 mL  1,000 mg Per Tube Q6H  . acetaminophen (TYLENOL) oral liquid 160 mg/5 mL  650 mg Per Tube Once   Or  . acetaminophen  650 mg Rectal Once  . ALPRAZolam  0.5 mg Oral QHS  . amiodarone  200 mg Oral BID  . aspirin EC  325 mg Oral Daily   Or  . aspirin  324 mg Per Tube Daily  . atorvastatin  40 mg Oral q1800  . buPROPion  450 mg Oral Daily  . cefUROXime (ZINACEF)  IV  1.5 g Intravenous  Q12H  . Chlorhexidine Gluconate Cloth  6 each Topical Daily  . famotidine (PEPCID) IV  20 mg Intravenous Q12H  . feeding supplement (ENSURE ENLIVE)  237 mL Oral Q24H  . feeding supplement (PRO-STAT SUGAR FREE 64)  30 mL Oral TID BM  . gabapentin  800 mg Oral TID  . insulin aspart  0-24 Units Subcutaneous Q4H  . mouth rinse  15 mL Mouth Rinse BID  . metoprolol tartrate  25 mg Oral BID  . multivitamin with minerals  1 tablet Oral QHS  . mupirocin ointment  1 application Nasal BID  . pantoprazole  40 mg Oral Daily  . sertraline  150 mg Oral Daily  .  sodium chloride flush  3 mL Intravenous Q12H     ASSESSMENT AND PLAN:  1. Severe aortic stenosis/moderate aortic insufficiency: He is POD #1 s/p TAVR. He is doing well this am. BP 90s-110s. He is sitting up in the chair. Will continue ASA today. Restart Eliquis tomorrow. Echo later today. Remove arterial line, central line, foley catheter.   2. Paroxysmal atrial fib: Maintaining sinus today. Continue amiodarone. Restart Eliquis tomorrow.   3. History of left carotid artery dissection: Follow   Transfer to telemetry unit today.   Verne Carrowhristopher McAlhany  1/31/20187:07 AM

## 2016-05-07 NOTE — Progress Notes (Signed)
Occupational Therapy Treatment Patient Details Name: Jorge DroughtGeorge O Tallman MRN: [id number] DOB: 11/04/1945 Today's Date: 05/07/2016    History of present illness 71 y.o.malewith a history of paroxysmal atrial fibrillation on Eliquis , DCHF, AS, hypertension, hyperlipidemia, obey CT, prior history of stroke, carotid artery disease, brought to the ED after a syncopal episode. 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   OT comments  Pt making good progress toward OT goals this session. Able to perform toilet transfer with ambulation to the bathroom with min guard assist. Continues to require min assist for bed mobility and LB dressing. SpO2 >94% on RA throughout activity; mild SOB noted. Began education on energy conservation and fall prevention. Pt reports he would like to d/c home alone with intermittent supervision form family/neighbor. If pt can achieve mod I prior to d/c then [facility]. Will continue to follow acutely.   Follow Up Recommendations  Home health OT (if pt can acheive mod I; if not, may need SNF)    Equipment Recommendations  Other (comment) (may benefit from AE)    Recommendations for Other Services      Precautions / Restrictions Precautions Precautions: Fall Precaution Comments: LUE cast, hx of syncope Restrictions Weight Bearing Restrictions: No       Mobility Bed Mobility Overal bed mobility: Needs Assistance Bed Mobility: Supine to Sit     Supine to sit: Min assist     General bed mobility comments: Assist for trunk elevation from supine to sit due to LUE fx. HOB flat with use of bed rail coming out of bed on L side. Discussed going out on R side of bed so he doesnt have to push up with LUE.  Transfers Overall transfer level: Needs assistance Equipment used: None Transfers: Sit to/from Stand Sit to Stand: Min guard         General transfer comment: Min guard for safety, no physical assist required. No unsteadiness during functional  activities. No dizziness reported.    Balance Overall balance assessment: Needs assistance;History of Falls Sitting-balance support: Feet supported;No upper extremity supported Sitting balance-Leahy Scale: Good     Standing balance support: No upper extremity supported;During functional activity Standing balance-Leahy Scale: Good                     ADL Overall ADL's : Needs assistance/impaired     Grooming: Supervision/safety;Standing;Wash/dry hands               Lower Body Dressing: Minimal assistance;Sit to/from stand   Toilet Transfer: Min guard;Ambulation;Regular Toilet;Grab bars   Toileting- Clothing Manipulation and Hygiene: Min guard;Sit to/from Nurse, children'sstand     Tub/Shower Transfer Details (indicate cue type and reason): Pt reports he has a tub bench at home and plans to have his son set it up before he gets back there. Functional mobility during ADLs: Min guard General ADL Comments: Pt with mild SOB during activity; SpO2 >94% throughout. Discussed energy conservation startegies and fall prevention at home. Pt reports he would like to return home if possible but does live alone.      Vision                     Perception     Praxis      Cognition   Behavior During Therapy: [facility] for tasks assessed/performed Overall Cognitive Status: Within Functional Limits for tasks assessed  Extremity/Trunk Assessment               Exercises     Shoulder Instructions       General Comments      Pertinent Vitals/ Pain       Pain Assessment: No/denies pain  Home Living                                          Prior Functioning/Environment              Frequency  Min 2X/week        Progress Toward Goals  OT Goals(current goals can now be found in the care plan section)  Progress towards OT goals: Progressing toward goals  Acute Rehab OT Goals Patient Stated Goal: return home  Plan  Discharge plan needs to be updated    Co-evaluation                 End of Session     Activity Tolerance Patient tolerated treatment well   Patient Left in bed;with call bell/phone within reach   Nurse Communication Mobility status        Time: 1610-9604 OT Time Calculation (min): 18 min  Charges: OT General Charges $OT Visit: 1 Procedure OT Treatments $Self Care/Home Management : 8-22 mins   Gaye Alken M.S., OTR/L Pager: [phone]  05/07/2016, 10:37 AM

## 2016-05-08 ENCOUNTER — Encounter (HOSPITAL_COMMUNITY): Payer: Self-pay | Admitting: Cardiovascular Disease

## 2016-05-08 DIAGNOSIS — Z952 Presence of prosthetic heart valve: Secondary | ICD-10-CM

## 2016-05-08 LAB — CBC
HCT: 41.7 % (ref 39.0–52.0)
Hemoglobin: 13.6 g/dL (ref 13.0–17.0)
MCH: 31.1 pg (ref 26.0–34.0)
MCHC: 32.6 g/dL (ref 30.0–36.0)
MCV: 95.4 fL (ref 78.0–100.0)
Platelets: 191 K/uL (ref 150–400)
RBC: 4.37 MIL/uL (ref 4.22–5.81)
RDW: 15.5 % (ref 11.5–15.5)
WBC: 9 K/uL (ref 4.0–10.5)

## 2016-05-08 LAB — GLUCOSE, CAPILLARY
GLUCOSE-CAPILLARY: 94 mg/dL (ref 65–99)
GLUCOSE-CAPILLARY: 111 mg/dL — AB (ref 65–99)
Glucose-Capillary: 100 mg/dL — ABNORMAL HIGH (ref 65–99)
Glucose-Capillary: 104 mg/dL — ABNORMAL HIGH (ref 65–99)
Glucose-Capillary: 112 mg/dL — ABNORMAL HIGH (ref 65–99)
Glucose-Capillary: 119 mg/dL — ABNORMAL HIGH (ref 65–99)
Glucose-Capillary: 89 mg/dL (ref 65–99)

## 2016-05-08 LAB — BASIC METABOLIC PANEL WITH GFR
Anion gap: 9 (ref 5–15)
BUN: 21 mg/dL — ABNORMAL HIGH (ref 6–20)
CO2: 23 mmol/L (ref 22–32)
Calcium: 8.4 mg/dL — ABNORMAL LOW (ref 8.9–10.3)
Chloride: 106 mmol/L (ref 101–111)
Creatinine, Ser: 0.91 mg/dL (ref 0.61–1.24)
GFR calc Af Amer: >60 mL/min
GFR calc non Af Amer: >60 mL/min
Glucose, Bld: 95 mg/dL (ref 65–99)
Potassium: 4.6 mmol/L (ref 3.5–5.1)
Sodium: 138 mmol/L (ref 135–145)

## 2016-05-08 MED ORDER — AMIODARONE HCL 200 MG PO TABS
200.0000 mg | ORAL_TABLET | Freq: Every day | ORAL | Status: DC
  Administered 2016-05-08 – 2016-05-09 (×2): 200 mg via ORAL
  Filled 2016-05-08 (×2): qty 1

## 2016-05-08 MED ORDER — APIXABAN 5 MG PO TABS
5.0000 mg | ORAL_TABLET | Freq: Two times a day (BID) | ORAL | Status: DC
  Administered 2016-05-08 – 2016-05-09 (×3): 5 mg via ORAL
  Filled 2016-05-08 (×3): qty 1

## 2016-05-08 NOTE — Progress Notes (Signed)
      [address]       [place] [id number]             [phone]      2 Days Post-Op Procedure(s) (LRB): TRANSCATHETER AORTIC VALVE REPLACEMENT, TRANSFEMORAL using a 23mm Edwards Sapien 3 Transcatheter Heart Valve (N/A) TRANSESOPHAGEAL ECHOCARDIOGRAM (TEE) (N/A)   Subjective:  No specific complaints.  States he is feeling much better like a new man.  He has ambulated in the hallway and states he hasn't been able to walk that far in a long time.  Objective: Vital signs in last 24 hours: Temp:  [98.3 F (36.8 C)-98.6 F (37 C)] 98.3 F (36.8 C) (02/01 0428) Pulse Rate:  [59-65] 59 (02/01 0428) Cardiac Rhythm: Normal sinus rhythm;Bundle branch block;Heart block (01/31 1951) Resp:  [18-20] 20 (02/01 0428) BP: (102-108)/(61-64) 108/62 (02/01 0428) SpO2:  [96 %-97 %] 97 % (02/01 0428) Weight:  [319 lb 14.2 oz (145.1 kg)] 319 lb 14.2 oz (145.1 kg) (02/01 0428)  Intake/Output from previous day: 01/31 0701 - 02/01 0700 In: 410 [P.O.:360; IV Piggyback:50] Out: 1200 [Urine:1200]  General appearance: alert, cooperative and no distress Heart: regular rate and rhythm Lungs: clear to auscultation bilaterally Abdomen: soft, non-tender; bowel sounds normal; no masses,  no organomegaly Extremities: no edema, + venous stasis changes Wound: clean and dry  Lab Results:  Recent Labs  05/07/16 0422 05/08/16 0226  WBC 9.9 9.0  HGB 12.8* 13.6  HCT 39.4 41.7  PLT 227 191   BMET:  Recent Labs  05/07/16 0422 05/08/16 0226  NA 136 138  K 4.1 4.6  CL 102 106  CO2 26 23  GLUCOSE 96 95  BUN 14 21*  CREATININE 0.80 0.91  CALCIUM 8.1* 8.4*    PT/INR:  Recent Labs  05/06/16 1440  LABPROT 14.5  INR 1.13   ABG    Component Value Date/Time   PHART 7.364 05/06/2016 1454   HCO3 30.8 (H) 05/06/2016 1454   TCO2 32 05/06/2016 1454   O2SAT 99.0 05/06/2016 1454   CBG (last 3)   Recent Labs  05/08/16 0424 05/08/16 0618 05/08/16 0752  GLUCAP 100* 104* 94      Assessment/Plan: S/P Procedure(s) (LRB): TRANSCATHETER AORTIC VALVE REPLACEMENT, TRANSFEMORAL using a 23mm Edwards Sapien 3 Transcatheter Heart Valve (N/A) TRANSESOPHAGEAL ECHOCARDIOGRAM (TEE) (N/A)  1. CV- S/P TAVR, Maintaining NSR currently- on Amiodarone, Lopressor, Eliquis 2. Deconditioning- home PT/OT nursing ordered 3. Dispo- care per Cardiology, possibly ready for d/c home tomorrow   LOS: 9 days    Raford PitcherBARRETT, Denny PeonRIN 05/08/2016

## 2016-05-08 NOTE — Progress Notes (Signed)
LCSW following for disposition.  Plan was for SNF, however patient has progressed very well, walking 41300ft. PT has changed recommendations to home health from SNF. Due to insurance authorization, patient will not qualify for SNF placement.  CM to follow up with home health.  Plan: DC home.  Deretha EmoryHannah Williom Cedar LCSW, MSW Clinical Social Work: Optician, dispensingystem Wide Float Coverage for :  [phone]

## 2016-05-08 NOTE — Care Management Note (Signed)
Case Management Note Donn PieriniKristi Rossanna Spitzley RN, BSN Unit 2W-Case Manager [phone]  Patient Details  Name: Jorge DroughtGeorge O [name] MRN: [id number] Date of Birth: 08-26-1945  Subjective/Objective:  Pt admitted with SOB/syncope- recent fall at home- with L arm cast- s/p TAVR - tx from ICU to 2W on 05/07/16                 Action/Plan: PTA pt lived at home alone, per PT/OT feel pt has progressed to being able to return home- orders for HH-RN/PT/OT placed- spoke with pt at bedside to [facility] [place]- per pt he does not have preference- as long as Clinical research associateagency contracts with insurance- [facility] [employer] [facility]- they are able to accept referral for HHRN/PT/OT- per pt he has been on Eliquis PTA- states that he is usually in donut hole part of the hear and has trouble with cost- encourage pt to call Eliquis 800# when he gets in donut hole to see if he would qualify for any kind of pt assistance.    Expected Discharge Date:     05/09/16             Expected Discharge Plan:  Home w Home Health Services  In-House Referral:     Discharge planning Services  CM Consult, Medication Assistance  Post Acute Care Choice:  Home Health Choice offered to:  Patient  DME Arranged:  N/A DME Agency:  NA  HH Arranged:  RN, PT, OT HH Agency:  [facility]  Status of Service:  Completed, signed off  If discussed at Long Length of Stay Meetings, dates discussed:    Discharge Disposition: home with home health   Additional Comments:  Darrold SpanWebster, Jung Ingerson Hall, RN 05/08/2016, 3:36 PM

## 2016-05-08 NOTE — Progress Notes (Signed)
Insurance check for [employer]W [employer] @ [employer] # [id number]    ELIQUIS 5 MG BID    COVER- YES  CO-PAY- $ 45.00  TIER - 3 DRUG  PRIOR APPROVAL- NO  PHARMACY : [facility]

## 2016-05-08 NOTE — Progress Notes (Signed)
PROGRESS NOTE    LONDELL NOLL  ZOX:[id number] DOB: August 02, 1945 DOA: 04/28/2016 PCP: [name], PA-C   Chief Complaint  Patient presents with  . Loss of Consciousness    Brief Narrative:  HPI on 04/28/2016 by Ms. [name], PA DOMINYCK RESER is a 71 y.o. male with a history of paroxysmal atrial fibrillation on Eliquis , DCHF,  AS,  hypertension, hyperlipidemia, obey CT, prior history of stroke, carotid artery disease, brought to the ED after a syncopal episode. In review, she was shaving around 8:30 this morning, when he turned his head to the right, accompanied by some prodromal symptoms including nausea, and "shinning lights:, falling an hitting his head against the bathtub. He had lost consciousness for several minutes, but remained on the floor till 11 am, when he was found by his caretaker and EMS was called. Patient reported increased anxiety a the time, and complains of vague chest pain, without palpitations. He reports increasing shortness of breath without cough. He denies any jaw pain or left arm pain. He reports having similar dizzy spells over the last year but no presyncopal or syncopal events. No dysarthria or dysphagia. Denies any unilateral weakness or sensory deficiencies.  Denies any confusion. Of note, patient has trouble finding words since his last stroke. No seizures reported.  Denies any fever or chills, or night sweats. Denies any abdominal pain, or diarrhea. Denies any sick contacts or new foods. Denies any recent long distance trips. No recent surgeries. Denies worsening lower extremity swelling. Denies abnormal skin rashes, or neuropathy. Compliant with  medications. Does not smoke.  Interim history: Syncope possible related to severe aortic stenosis and underwent TAVR.   Assessment & Plan   Hypotension -possibly one cause of patient's syncope (along with aortic stenosis) -Unclear etiology for hypotension.  -upon admission denied fever, cough, chest pain,  UR symptoms, no abdominal pain. Had some diarrhea few days ago which has resolved.  -Cardiology consulted and appreciated -Echocardiogram 04/29/2016: EF 55-60%, grade 1 diastolic dysfunction. Severe AS -Cefepime started empirically given that patient also had leukocytosis- discontinued 05/01/2016  Syncope  -D-Dimer 3.28, EKG unrevealing.  -CT head negative for acute intracranial abnormalities. -CT angio chest negative for central PE.  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  Widely patent vertebral arteries within the neck. There is a irregular curvilinear filling defect within the proximal left ICA, likely a small dissection flap (series 7, image 210). -Due to abnormal CT angio neck, vascular surgery consulted and appreciated  Severe aortic stenosis -Possibly cause of patient's syncope. -Noted on Echocardiogram and cardiac cath -CT surgery consulted and appreciated -CTA chest/abd/pelvis: severe thickening and calcification of the aortic valve. Aortic atherosclerosis, left main and LAD CAD.  -S/p TAVR on 05/06/16 -repeat Echocardiogram post TAVR no regurg, increased gradient  ?Left ICA dissection -CTA neck as above -Vascular surgery consulted and did not recommend further acute intervention at this time. Patient already on anticoagulation  -s/p L CEA 2014- follows with Dr. Darrick Penna   Dental caries -Dentistry consulted, s/p multiple tooth extractions 1/26  Elevated troponin/NSTEMI -Echo as above -R/LHC: mild non-obstructive CAD, severe aortic stenosis (mean gradient 33.18mmHg)  Hypertension  -on metoprolol, holding lasix  Paroxysmal Atrial Fibrillation  -CHA2DS2-VASc score 6  -Continue anticoagulation, Eliquis held due to Cath and surgery, now  resumed  Hyperlipidemia -Continue statin  Peripheral Neuropathy -Continue Neurontin   Depression -Continue home Zoloft   Generalized weakness -PT and OT consulted- rec SNF initially but now with improvement and Recommendation changed to home health  Constipation -continue bowel regimen -Resolved  Headache -Possibly related to lack of caffeine as patient drinks 3-4 cups of coffee per day -Resolved with caffeine.  Left humeral fracture -present on admission. Secondary to fall -Cast was placed on 12/20- patient sees [facility] Loralie Champagne at [facility] -Called the office on 1/26, cast is supposed to removed on 05/20/16 -Left humeral xray: no change in displaced, angulated comminuted left humeral fracture  DVT Prophylaxis  Heparin   Code Status: Full  Family Communication: none at bedside  Disposition Plan: discharge home with home health tomorrow.   Consultants Cardiology Vascular surgery  Cardiothoracic surgery  Procedures  Echocardiogram [facility] TAVR  Antibiotics   Anti-infectives    Start     Dose/Rate Route Frequency Ordered Stop   05/07/16 0000  cefUROXime (ZINACEF) 1.5 g in dextrose 5 % 50 mL IVPB     1.5 g 100 mL/hr over 30 Minutes Intravenous Every 12 hours 05/06/16 1500 05/08/16 1301   05/06/16 2345  vancomycin (VANCOCIN) IVPB 1000 mg/200 mL premix     1,000 mg 200 mL/hr over 60 Minutes Intravenous  Once 05/06/16 1455 05/07/16 0019   05/06/16 2145  vancomycin (VANCOCIN) IVPB 1000 mg/200 mL premix     1,000 mg 200 mL/hr over 60 Minutes Intravenous  Once 05/06/16 1428 05/06/16 2245   05/06/16 1745  cefUROXime (ZINACEF) 1.5 g in dextrose 5 % 50 mL IVPB  Status:  Discontinued     1.5 g 100 mL/hr over 30 Minutes Intravenous Every 12 hours 05/06/16 1428 05/06/16 1500   05/06/16 0400  vancomycin (VANCOCIN) 1,500 mg in sodium chloride 0.9 % 250 mL IVPB  Status:  Discontinued     1,500 mg 125 mL/hr over 120 Minutes Intravenous To Surgery 05/05/16 1653 05/06/16  1428   05/06/16 0400  cefUROXime (ZINACEF) 1.5 g in dextrose 5 % 50 mL IVPB  Status:  Discontinued     1.5 g 100 mL/hr over 30 Minutes Intravenous To Surgery 05/05/16 1653 05/06/16 1428   05/06/16 0400  cefUROXime (ZINACEF) 750 mg in dextrose 5 % 50 mL IVPB  Status:  Discontinued     750 mg 100 mL/hr over 30 Minutes Intravenous To Surgery 05/05/16 1652 05/06/16 1428   05/02/16 1015  ceFAZolin (ANCEF) 3 g in dextrose 5 % 50 mL IVPB     3 g 130 mL/hr over 30 Minutes Intravenous  Once 05/01/16 1602 05/02/16 1137   04/29/16 2200  vancomycin (VANCOCIN) IVPB 750 mg/150 ml premix  Status:  Discontinued     750 mg 150 mL/hr over 60 Minutes Intravenous Every 12 hours 04/29/16 0939 05/01/16 1021   04/29/16 0930  ceFEPIme (MAXIPIME) 2 g in dextrose 5 % 50 mL IVPB  Status:  Discontinued     2 g 100 mL/hr over 30 Minutes Intravenous Every 12 hours 04/29/16 0859 05/01/16 1021   04/29/16 0930  vancomycin (VANCOCIN) IVPB 1000 mg/200 mL premix     1,000 mg 200 mL/hr over 60 Minutes Intravenous  Once 04/29/16 0859 04/29/16 1143      Subjective:  No acute complains, feeling better  Objective:   Vitals:   05/08/16 0428 05/08/16 0827 05/08/16 1005 05/08/16 1332  BP: 108/62 (!) 94/59 110/60 102/82  Pulse: (!) 59 61 60 61  Resp: 20 18  18   Temp: 98.3 F (  36.8 C) 97.6 F (36.4 C)  97.8 F (36.6 C)  TempSrc: Oral Oral  Oral  SpO2: 97% 97%  98%  Weight: (!) 145.1 kg (319 lb 14.2 oz)     Height:        Intake/Output Summary (Last 24 hours) at 05/08/16 1500 Last data filed at 05/08/16 1300  Gross per 24 hour  Intake             1010 ml  Output             1200 ml  Net             -190 ml   Filed Weights   05/06/16 0551 05/07/16 0500 05/08/16 0428  Weight: (!) 141.5 kg (312 lb) (!) 142.5 kg (314 lb 2.5 oz) (!) 145.1 kg (319 lb 14.2 oz)    Exam  General: Well developed, well nourished, NAD  HEENT: NCAT,mucous membranes moist.    Cardiovascular: S1 S2 auscultated,3/6SEM,  regular  Respiratory: Clear to auscultation bilaterally with equal chest rise  Abdomen: Soft, obese, nontender, nondistended, + bowel sounds  Extremities: warm dry without cyanosis clubbing. Trace LE edema. LUE in cast  Neuro: AAOx3, nonfocal  Psych: Normal affect and demeanor, pleasant    Data Reviewed: I have personally reviewed following labs and imaging studies  CBC:  Recent Labs Lab 05/05/16 0257 05/06/16 0334  05/06/16 1344 05/06/16 1440 05/06/16 1442 05/07/16 0422 05/08/16 0226  WBC 8.6 9.5  --   --  10.3  --  9.9 9.0  HGB 13.0 13.8  < > 13.9 13.0 13.6 12.8* 13.6  HCT 40.7 42.5  < > 41.0 40.3 40.0 39.4 41.7  MCV 94.9 93.8  --   --  93.7  --  94.0 95.4  PLT 255 245  --   --  232  --  227 191  < > = values in this interval not displayed. Basic Metabolic Panel:  Recent Labs Lab 05/04/16 0508 05/05/16 0257 05/06/16 0334 05/06/16 1158 05/06/16 1253 05/06/16 1344 05/06/16 1442 05/07/16 0422 05/08/16 0226  NA 139 141 140 139 136 138 140 136 138  K 3.8 4.0 4.1 4.0 4.2 4.1 3.8 4.1 4.6  CL 103 103 103 99* 100* 100*  --  102 106  CO2 28 28 28   --   --   --   --  26 23  GLUCOSE 104* 96 106* 99 149* 163* 140* 96 95  BUN 16 20 19 19 19 19   --  14 21*  CREATININE 0.89 0.89 0.90 0.70 0.80 0.90  --  0.80 0.91  CALCIUM 8.7* 9.0 9.0  --   --   --   --  8.1* 8.4*  MG  --   --   --   --   --   --   --  2.8*  --    GFR: Estimated Creatinine Clearance: 114.7 mL/min (by C-G formula based on SCr of 0.91 mg/dL). Liver Function Tests: No results for input(s): AST, ALT, ALKPHOS, BILITOT, PROT, ALBUMIN in the last 168 hours. No results for input(s): LIPASE, AMYLASE in the last 168 hours. No results for input(s): AMMONIA in the last 168 hours. Coagulation Profile:  Recent Labs Lab 05/06/16 1440  INR 1.13   Cardiac Enzymes: No results for input(s): CKTOTAL, CKMB, CKMBINDEX, TROPONINI in the last 168 hours. BNP (last 3 results) No results for input(s): PROBNP in the last  8760 hours. HbA1C: No results for input(s): HGBA1C in the last 72  hours. CBG:  Recent Labs Lab 05/08/16 0016 05/08/16 0424 05/08/16 0618 05/08/16 0752 05/08/16 1205  GLUCAP 119* 100* 104* 94 89   Lipid Profile: No results for input(s): CHOL, HDL, LDLCALC, TRIG, CHOLHDL, LDLDIRECT in the last 72 hours. Thyroid Function Tests: No results for input(s): TSH, T4TOTAL, FREET4, T3FREE, THYROIDAB in the last 72 hours. Anemia Panel: No results for input(s): VITAMINB12, FOLATE, FERRITIN, TIBC, IRON, RETICCTPCT in the last 72 hours. Urine analysis:    Component Value Date/Time   COLORURINE YELLOW 04/29/2016 1233   APPEARANCEUR CLEAR 04/29/2016 1233   LABSPEC 1.021 04/29/2016 1233   PHURINE 6.0 04/29/2016 1233   GLUCOSEU NEGATIVE 04/29/2016 1233   HGBUR SMALL (A) 04/29/2016 1233   BILIRUBINUR NEGATIVE 04/29/2016 1233   KETONESUR NEGATIVE 04/29/2016 1233   PROTEINUR NEGATIVE 04/29/2016 1233   NITRITE NEGATIVE 04/29/2016 1233   LEUKOCYTESUR NEGATIVE 04/29/2016 1233   Sepsis Labs: @LABRCNTIP (procalcitonin:4,lacticidven:4)  ) Recent Results (from the past 240 hour(s))  Culture, blood (routine x 2)     Status: None   Collection Time: 04/29/16  9:20 AM  Result Value Ref Range Status   Specimen Description BLOOD RIGHT ARM  Final   Special Requests IN PEDIATRIC BOTTLE  2CC  Final   Culture NO GROWTH 5 DAYS  Final   Report Status 05/04/2016 FINAL  Final  Culture, blood (routine x 2)     Status: None   Collection Time: 04/29/16  9:27 AM  Result Value Ref Range Status   Specimen Description BLOOD RIGHT HAND  Final   Special Requests IN PEDIATRIC BOTTLE  3 CC  Final   Culture NO GROWTH 5 DAYS  Final   Report Status 05/04/2016 FINAL  Final  Urine culture     Status: None   Collection Time: 04/29/16 12:33 PM  Result Value Ref Range Status   Specimen Description URINE, CLEAN CATCH  Final   Special Requests NONE  Final   Culture NO GROWTH  Final   Report Status 04/30/2016 FINAL  Final   Surgical pcr screen     Status: Abnormal   Collection Time: 05/01/16  7:47 PM  Result Value Ref Range Status   MRSA, PCR NEGATIVE NEGATIVE Final   Staphylococcus aureus POSITIVE (A) NEGATIVE Final    Comment:        The Xpert SA Assay (FDA approved for NASAL specimens in patients over 60 years of age), is one component of a comprehensive surveillance program.  Test performance has been validated by [facility] for patients greater than or equal to 22 year old. It is not intended to diagnose infection nor to guide or monitor treatment.       Radiology Studies: Dg Chest Port 1 View  Result Date: 05/07/2016 CLINICAL DATA:  Chest soreness. EXAM: PORTABLE CHEST 1 VIEW COMPARISON:  05/06/2016. FINDINGS: Right IJ line noted in good anatomic position. Cardiomegaly with normal pulmonary vascularity. Aortic valve prosthesis again noted. Low lung volumes with basilar atelectasis. Mild basilar infiltrates cannot be excluded. IMPRESSION: 1.  Right IJ line stable position. 2. Stable cardiomegaly.  Aortic valve prosthesis again noted. 3. Stable basilar atelectasis. Electronically Signed   By: Maisie Fus  Register   On: 05/07/2016 08:13   Dg Chest Port 1 View  Result Date: 05/06/2016 CLINICAL DATA:  Status post transcatheter aortic valve replacement. EXAM: PORTABLE CHEST 1 VIEW COMPARISON:  Radiograph of April 29, 2016. FINDINGS: Stable cardiomediastinal silhouette. Aortic valve prosthesis is noted. No pneumothorax or pleural effusion is noted. Hypoinflation of the lungs is  noted with bibasilar subsegmental atelectasis. Right internal jugular catheter is noted with distal tip in expected position of the SVC. Bony thorax is unremarkable. IMPRESSION: Aortic valve prosthesis is noted. Hypoinflation the lungs is noted with bibasilar subsegmental atelectasis. Electronically Signed   By: Lupita RaiderJames  Green Jr, M.D.   On: 05/06/2016 15:06     Scheduled Meds: . ALPRAZolam  0.5 mg Oral QHS  . amiodarone  200 mg  Oral Daily  . apixaban  5 mg Oral BID  . atorvastatin  40 mg Oral q1800  . buPROPion  450 mg Oral Daily  . feeding supplement (ENSURE ENLIVE)  237 mL Oral Q24H  . feeding supplement (PRO-STAT SUGAR FREE 64)  30 mL Oral TID BM  . gabapentin  800 mg Oral TID  . insulin aspart  0-24 Units Subcutaneous Q4H  . mouth rinse  15 mL Mouth Rinse BID  . metoprolol tartrate  25 mg Oral BID  . multivitamin with minerals  1 tablet Oral QHS  . pantoprazole  40 mg Oral Daily  . sertraline  150 mg Oral Daily  . sodium chloride flush  3 mL Intravenous Q12H   Continuous Infusions:    LOS: 9 days   Time Spent in minutes   30 minutes Author:  Lynden OxfordPranav Quayshawn Nin, MD [facility] Pager: [phone] 05/08/2016 3:11 PM     After 7pm go to [email or web address] - password [email or web address]  And look for the night coverage person covering for me after hours  [facility] Office  [phone]

## 2016-05-08 NOTE — Progress Notes (Signed)
      [address]       [place] [id number]             [phone]      2 Days Post-Op Procedure(s) (LRB): TRANSCATHETER AORTIC VALVE REPLACEMENT, TRANSFEMORAL using a 23mm Edwards Sapien 3 Transcatheter Heart Valve (N/A) TRANSESOPHAGEAL ECHOCARDIOGRAM (TEE) (N/A) Subjective: Eating breakfast peacefully. No issues  Objective: Vital signs in last 24 hours: Temp:  [98.3 F (36.8 C)-98.6 F (37 C)] 98.3 F (36.8 C) (02/01 0428) Pulse Rate:  [59-65] 59 (02/01 0428) Cardiac Rhythm: Normal sinus rhythm;Bundle branch block;Heart block (01/31 1951) Resp:  [18-20] 20 (02/01 0428) BP: (102-108)/(61-64) 108/62 (02/01 0428) SpO2:  [96 %-97 %] 97 % (02/01 0428) Weight:  [145.1 kg (319 lb 14.2 oz)] 145.1 kg (319 lb 14.2 oz) (02/01 0428)     Intake/Output from previous day: 01/31 0701 - 02/01 0700 In: 410 [P.O.:360; IV Piggyback:50] Out: 1200 [Urine:1200] Intake/Output this shift: No intake/output data recorded.  General appearance: alert, cooperative and no distress Heart: sinus bradycardia Lungs: clear to auscultation bilaterally Abdomen: soft, non-tender; bowel sounds normal; no masses,  no organomegaly Extremities: extremities normal, atraumatic, no cyanosis or edema Wound: groin site soft without hematoma  Lab Results:  Recent Labs  05/07/16 0422 05/08/16 0226  WBC 9.9 9.0  HGB 12.8* 13.6  HCT 39.4 41.7  PLT 227 191   BMET:  Recent Labs  05/07/16 0422 05/08/16 0226  NA 136 138  K 4.1 4.6  CL 102 106  CO2 26 23  GLUCOSE 96 95  BUN 14 21*  CREATININE 0.80 0.91  CALCIUM 8.1* 8.4*    PT/INR:  Recent Labs  05/06/16 1440  LABPROT 14.5  INR 1.13   ABG    Component Value Date/Time   PHART 7.364 05/06/2016 1454   HCO3 30.8 (H) 05/06/2016 1454   TCO2 32 05/06/2016 1454   O2SAT 99.0 05/06/2016 1454   CBG (last 3)   Recent Labs  05/08/16 0424 05/08/16 0618 05/08/16 0752  GLUCAP 100* 104* 94    Assessment/Plan: S/P Procedure(s)  (LRB): TRANSCATHETER AORTIC VALVE REPLACEMENT, TRANSFEMORAL using a 23mm Edwards Sapien 3 Transcatheter Heart Valve (N/A) TRANSESOPHAGEAL ECHOCARDIOGRAM (TEE) (N/A)  He is hemodynamically stable with sinus bradycardia. Rate is high 50s with stable Bp. Echocardiogram yesterday showed no perivalvular leak. Ambulate today and continue to use incentive spirometer. Eliquis for atrial fibrillation initiated today. Likely home in the next 1-2 days.    LOS: 9 days    Sharlene Doryessa N Arilla Hice 05/08/2016

## 2016-05-08 NOTE — Progress Notes (Addendum)
SUBJECTIVE: No chest pain or dyspnea.   Tele: sinus  BP 108/62 (BP Location: Left Arm)   Pulse (!) 59   Temp 98.3 F (36.8 C) (Oral)   Resp 20   Ht 6\' 2"  (1.88 m)   Wt (!) 319 lb 14.2 oz (145.1 kg)   SpO2 97%   BMI 41.07 kg/m   Intake/Output Summary (Last 24 hours) at 05/08/16 0706 Last data filed at 05/08/16 0532  Gross per 24 hour  Intake              410 ml  Output             1200 ml  Net             -790 ml    PHYSICAL EXAM General: Obese WM, NAD. Alert and oriented x 3.  Psych:  Good affect, responds appropriately Neck: No JVD. No masses noted.  Lungs: Clear bilaterally with no wheezes or rhonci noted.  Heart: RRR with soft systolic murmur noted.  Abdomen: Bowel sounds are present. Soft, non-tender.  Extremities: No lower extremity edema.   LABS: Basic Metabolic Panel:  Recent Labs  16/10/96 0422 05/08/16 0226  NA 136 138  K 4.1 4.6  CL 102 106  CO2 26 23  GLUCOSE 96 95  BUN 14 21*  CREATININE 0.80 0.91  CALCIUM 8.1* 8.4*  MG 2.8*  --    CBC:  Recent Labs  05/07/16 0422 05/08/16 0226  WBC 9.9 9.0  HGB 12.8* 13.6  HCT 39.4 41.7  MCV 94.0 95.4  PLT 227 191    Current Meds: . ALPRAZolam  0.5 mg Oral QHS  . amiodarone  200 mg Oral BID  . aspirin EC  325 mg Oral Daily   Or  . aspirin  324 mg Per Tube Daily  . atorvastatin  40 mg Oral q1800  . buPROPion  450 mg Oral Daily  . cefUROXime (ZINACEF)  IV  1.5 g Intravenous Q12H  . feeding supplement (ENSURE ENLIVE)  237 mL Oral Q24H  . feeding supplement (PRO-STAT SUGAR FREE 64)  30 mL Oral TID BM  . gabapentin  800 mg Oral TID  . insulin aspart  0-24 Units Subcutaneous Q4H  . mouth rinse  15 mL Mouth Rinse BID  . metoprolol tartrate  25 mg Oral BID  . multivitamin with minerals  1 tablet Oral QHS  . pantoprazole  40 mg Oral Daily  . sertraline  150 mg Oral Daily  . sodium chloride flush  3 mL Intravenous Q12H   Echo 05/08/16: Left ventricle: The cavity size was normal. Wall  thickness was   increased in a pattern of mild LVH. Systolic function was normal.   The estimated ejection fraction was in the range of 60% to 65%. 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 No   perivalvular regurgitation. Gradient are a bit high and have   increased since OR   leaflets not well seen. Valve area (VTI): 1.57 cm^2. Valve area   (Vmax): 1.4 cm^2. Valve area (Vmean): 1.37 cm^2. - Atrial septum: No defect or patent foramen ovale was identified. - Pulmonary arteries: PA peak pressure: 32 mm Hg (S).  ASSESSMENT AND PLAN:  1. Severe aortic stenosis/moderate aortic insufficiency: He is POD #2 s/p TAVR. He is doing well this am. Echo yesterday with normal LV function, gradient slightly higher post op but his gradients have not been consistent from prior echo studies or  intra-op pressure readings. Will restart Eliquis today. He ambulated yesterday. I think he should be able to be discharged tomorrow. Based on PT/OT recs, it seems that HHOT, HHPT would be appropriate. Will ask Case Management to see him to discuss Home Health Nursing, HHPT, HHOT. His daughter in law lives down the street and will check on him also.   2. Paroxysmal atrial fib: Maintaining sinus today. Continue amiodarone. Restart Eliquis today  3. History of left carotid artery dissection: Follow   [facility].     Verne CarrowChristopher Calisha Tindel  2/1/20187:06 AM

## 2016-05-08 NOTE — Progress Notes (Signed)
CARDIAC REHAB PHASE I   PRE:  Rate/Rhythm: 59 SB  BP:  Supine:   Sitting: 102/82  Standing:    SaO2: 96%RA  MODE:  Ambulation: 500 ft   POST:  Rate/Rhythm: 65 SR  BP:  Supine:   Sitting: 111/51  Standing:    SaO2: 98%RA 1328-1355 Pt walked 500 ft on RA with asst x 1 with steady gait. Stopped twice to rest. Tolerated well. To recliner after walk with call bell.   Jorge Nuttingharlene Bina Veenstra, RN BSN  05/08/2016 1:53 PM

## 2016-05-09 LAB — GLUCOSE, CAPILLARY
Glucose-Capillary: 124 mg/dL — ABNORMAL HIGH (ref 65–99)
Glucose-Capillary: 94 mg/dL (ref 65–99)
Glucose-Capillary: 96 mg/dL (ref 65–99)

## 2016-05-09 MED ORDER — POLYETHYLENE GLYCOL 3350 17 G PO PACK
17.0000 g | PACK | Freq: Every day | ORAL | Status: DC
Start: 2016-05-09 — End: 2016-05-09
  Administered 2016-05-09: 17 g via ORAL
  Filled 2016-05-09: qty 1

## 2016-05-09 MED ORDER — ENSURE ENLIVE PO LIQD: 237.0000 mL | ORAL | 12 refills | Status: DC

## 2016-05-09 MED ORDER — AMIODARONE HCL 200 MG PO TABS: 200.0000 mg | ORAL_TABLET | Freq: Every day | ORAL | 0 refills | Status: DC

## 2016-05-09 MED ORDER — FUROSEMIDE 20 MG PO TABS
20.0000 mg | ORAL_TABLET | Freq: Every day | ORAL | 0 refills | Status: DC | PRN
Start: 2016-05-09 — End: 2017-01-14

## 2016-05-09 MED ORDER — POLYETHYLENE GLYCOL 3350 17 G PO PACK: 17.0000 g | PACK | Freq: Every day | ORAL | 0 refills | Status: DC

## 2016-05-09 MED ORDER — METOPROLOL TARTRATE 25 MG PO TABS: 25.0000 mg | ORAL_TABLET | Freq: Two times a day (BID) | ORAL | 0 refills | Status: DC

## 2016-05-09 MED ORDER — PANTOPRAZOLE SODIUM 40 MG PO TBEC: 40.0000 mg | DELAYED_RELEASE_TABLET | Freq: Every day | ORAL | 0 refills | Status: DC

## 2016-05-09 MED ORDER — PRO-STAT SUGAR FREE PO LIQD: 30.0000 mL | Freq: Three times a day (TID) | ORAL | 0 refills | Status: DC

## 2016-05-09 NOTE — Progress Notes (Signed)
PT Cancellation Note  Patient Details Name: Jorge [name] MRN: [id number] DOB: 03-10-1946   Cancelled Treatment:    Reason Eval/Treat Not Completed: Other (comment)   Patient observed walking in hallway. Briefly discussed his plan to discharge today. Continue to recommend HHPT for home safety evaluation.   Scherrie NovemberLynn P Shalen Petrak, PT 05/09/2016, 11:35 AM

## 2016-05-09 NOTE — Progress Notes (Signed)
SUBJECTIVE: No chest pain or dyspnea. He has been walking the hallways and feeling great.   Tele: sinus  BP (!) 112/29 (BP Location: Right Arm)   Pulse (!) 55   Temp 97.9 F (36.6 C) (Oral)   Resp 19   Ht 6\' 2"  (1.88 m)   Wt (!) 319 lb 14.2 oz (145.1 kg)   SpO2 97%   BMI 41.07 kg/m   Intake/Output Summary (Last 24 hours) at 05/09/16 0725 Last data filed at 05/09/16 0454  Gross per 24 hour  Intake              960 ml  Output              450 ml  Net              510 ml    PHYSICAL EXAM General: Well developed, well nourished, in no acute distress. Alert and oriented x 3.  Psych:  Good affect, responds appropriately Neck: No JVD. No masses noted.  Lungs: Clear bilaterally with no wheezes or rhonci noted.  Heart: RRR with soft systolic murmur noted. Abdomen: Bowel sounds are present. Soft, non-tender.  Extremities: No lower extremity edema.   LABS: Basic Metabolic Panel:  Recent Labs  16/10/96 0422 05/08/16 0226  NA 136 138  K 4.1 4.6  CL 102 106  CO2 26 23  GLUCOSE 96 95  BUN 14 21*  CREATININE 0.80 0.91  CALCIUM 8.1* 8.4*  MG 2.8*  --    CBC:  Recent Labs  05/07/16 0422 05/08/16 0226  WBC 9.9 9.0  HGB 12.8* 13.6  HCT 39.4 41.7  MCV 94.0 95.4  PLT 227 191   Current Meds: . ALPRAZolam  0.5 mg Oral QHS  . amiodarone  200 mg Oral Daily  . apixaban  5 mg Oral BID  . atorvastatin  40 mg Oral q1800  . buPROPion  450 mg Oral Daily  . feeding supplement (ENSURE ENLIVE)  237 mL Oral Q24H  . feeding supplement (PRO-STAT SUGAR FREE 64)  30 mL Oral TID BM  . gabapentin  800 mg Oral TID  . insulin aspart  0-24 Units Subcutaneous Q4H  . mouth rinse  15 mL Mouth Rinse BID  . metoprolol tartrate  25 mg Oral BID  . multivitamin with minerals  1 tablet Oral QHS  . pantoprazole  40 mg Oral Daily  . sertraline  150 mg Oral Daily  . sodium chloride flush  3 mL Intravenous Q12H    Echo 05/08/16: Left ventricle: The cavity size was normal. Wall thickness  was increased in a pattern of mild LVH. Systolic function was normal. The estimated ejection fraction was in the range of 60% to 65%. 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 No perivalvular regurgitation. Gradient are a bit high and have increased since OR leaflets not well seen. Valve area (VTI): 1.57 cm^2. Valve area (Vmax): 1.4 cm^2. Valve area (Vmean): 1.37 cm^2. - Atrial septum: No defect or patent foramen ovale was identified. - Pulmonary arteries: PA peak pressure: 32 mm Hg (S).  ASSESSMENT AND PLAN:  1. Severe aortic stenosis/moderate aortic insufficiency: He is POD #3 s/p TAVR. He is doing well this am. Posto op echo with normal LV function, gradient slightly higher post op but his gradients have not been consistent from prior echo studies or intra-op pressure readings. He has been restarted on Eliquis. He has been ambulating without difficulty. Based on PT/OT recs,  it seems that HHOT, HHPT would be appropriate. He will also need Home Health nursing. His daughter in law lives down the street and will check on him also.   2. Paroxysmal atrial fib: Maintaining sinus today. Continue amiodarone 200 mg po Qdaily. Continue Eliquis.   3. History of left carotid artery dissection: Follow   4. CAD: mild CAD by cath. Continue statin and beta blocker.   OK to d/c home today. I will arrange f/u visit in my office in one week and his f/u echo in one month with TAVR f/u at that time.    Jorge CarrowChristopher Kaire [name]  2/2/20187:25 AM

## 2016-05-09 NOTE — Progress Notes (Signed)
Occupational Therapy Treatment Patient Details Name: Jorge [name] MRN: [id number] DOB: 06/21/1945 Today's Date: 05/09/2016    History of present illness 71 y.o.malewith a history of paroxysmal atrial fibrillation on Eliquis , DCHF, AS, hypertension, hyperlipidemia, obey CT, prior history of stroke, carotid artery disease, brought to the ED after a syncopal episode. 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   OT comments  Pt able to perform 500 feet functional mobility with supervision and mild SOB. Educated pt on pursed lip breathing and energy conservation strategies; provided handout. D/c plan remains appropriate. Will continue to follow acutely.   Follow Up Recommendations  Home health OT;Supervision - Intermittent    Equipment Recommendations  None recommended by OT    Recommendations for Other Services      Precautions / Restrictions Precautions Precautions: Fall Restrictions Weight Bearing Restrictions: No       Mobility Bed Mobility               General bed mobility comments: Pt OOB in chair upon arrival.  Transfers Overall transfer level: Needs assistance Equipment used: None Transfers: Sit to/from Stand Sit to Stand: Supervision         General transfer comment: for safety. Increased time    Balance Overall balance assessment: Needs assistance Sitting-balance support: Feet supported;No upper extremity supported Sitting balance-Leahy Scale: Good     Standing balance support: No upper extremity supported;During functional activity Standing balance-Leahy Scale: Good                     ADL Overall ADL's : Needs assistance/impaired                 Upper Body Dressing : Set up;Standing   Lower Body Dressing: Supervision/safety   Toilet Transfer: Supervision/safety;Ambulation Toilet Transfer Details (indicate cue type and reason): Simulated         Functional mobility during ADLs: Supervision/safety General ADL Comments: Pt able to walk  500 feet with mild SOB. Educated on pursed lip breathing and energy conservation strategies; provided handout.      Vision                     Perception     Praxis      Cognition   Behavior During Therapy: WFL for tasks assessed/performed Overall Cognitive Status: Within Functional Limits for tasks assessed                       Extremity/Trunk Assessment               Exercises     Shoulder Instructions       General Comments      Pertinent Vitals/ Pain       Pain Assessment: No/denies pain  Home Living                                          Prior Functioning/Environment              Frequency  Min 2X/week        Progress Toward Goals  OT Goals(current goals can now be found in the care plan section)  Progress towards OT goals: Progressing toward goals  Acute Rehab OT Goals Patient Stated Goal: home today  Plan Discharge plan remains appropriate    Co-evaluation  End of Session Equipment Utilized During Treatment: Other (comment) (sling)   Activity Tolerance Patient tolerated treatment well   Patient Left in chair;with call bell/phone within reach   Nurse Communication          Time: [id number] OT Time Calculation (min): 14 min  Charges: OT General Charges $OT Visit: 1 Procedure OT Treatments $Therapeutic Activity: 8-22 mins  Gaye AlkenBailey A Vienne Corcoran M.S., OTR/L Pager: [phone]  05/09/2016, 9:46 AM

## 2016-05-09 NOTE — Progress Notes (Signed)
CARDIAC REHAB PHASE I  Pt already ambulated independently this morning, no complaints, declines additional ambulation at this time. Cardiac surgery discharge education completed. Reviewed IS, restrictions, activity progression, exercise, heart healthy diet, sodium restrictions, portion control, s/s heart failure, daily weights and phase 2 cardiac rehab. Pt verbalized understanding, receptive to education. Pt agrees to phase 2 cardiac rehab referral, [facility]. Pt in recliner, call bell within reach.  [phone] Joylene GrapesEmily C Charnae Lill, RN, BSN 05/09/2016 9:34 AM

## 2016-05-09 NOTE — Telephone Encounter (Signed)
Follow up  Calling to see if pt has procedure done in hospital.  Please f/u

## 2016-05-11 NOTE — Discharge Summary (Signed)
[facility] Discharge Summary   Patient: Jorge [name]:[id number]   PCP: [name], PA-C DOB: September 15, 1945   Date of admission: 04/28/2016   Date of discharge: 05/09/2016    Discharge Diagnoses:  Active Problems:   Dyspnea   Aortic stenosis   History of CEA (carotid endarterectomy)   Chest pain   CAD (coronary artery disease)   History of tobacco abuse   HLD (hyperlipidemia)   CHF (congestive heart failure) (HCC)   Syncope   Hypotension   NSTEMI (non-ST elevated myocardial infarction) (HCC)   Hypoxemia   Severe aortic stenosis   Fracture   Admitted From: home Disposition:  Home with home health  Recommendations for Outpatient Follow-up:  1. Follow-up with PCP in 1 week, cardiology and cardiac thoracic surgery as recommended   Follow-up Information    [facility] Follow up.   Specialty:  Home Health Services Why:  HHRN/PT/OT arranged- they will call you to set up home visits Contact information: [address] [place] [address] [phone]        [name], PA-C. Schedule an appointment as soon as possible for a visit in 1 week(s).   Specialty:  Physician Assistant Contact information: [address] [place] [address] [phone]        Verne Carrow, MD. Schedule an appointment as soon as possible for a visit in 1 month(s).   Specialty:  Cardiology Contact information: [address] [place] [address] [phone]          Diet recommendation: Cardiac diet  Activity: The patient is advised to gradually reintroduce usual activities.  Discharge Condition: good  Code Status: Full code  History of present illness: As per the H and P dictated on admission, " Jorge [name] is a 71 y.o. male with a history of paroxysmal atrial fibrillation on Eliquis , DCHF,  AS,  hypertension, hyperlipidemia, obey CT, prior history of stroke, carotid artery disease, brought to the ED  after a syncopal episode. In review, she was shaving around 8:30 this morning, when he turned his head to the right, accompanied by some prodromal symptoms including nausea, and "shinning lights:, falling an hitting his head against the bathtub. He had lost consciousness for several minutes, but remained on the floor till 11 am, when he was found by his caretaker and EMS was called. Patient reported increased anxiety a the time, and complains of vague chest pain, without palpitations. He reports increasing shortness of breath without cough. He denies any jaw pain or left arm pain. He reports having similar dizzy spells over the last year but no presyncopal or syncopal events. No dysarthria or dysphagia. Denies any unilateral weakness or sensory deficiencies.  Denies any confusion. Of note, patient has trouble finding words since his last stroke. No seizures reported.  Denies any fever or chills, or night sweats. Denies any abdominal pain, or diarrhea. Denies any sick contacts or new foods. Denies any recent long distance trips. No recent surgeries. Denies worsening lower extremity swelling. Denies abnormal skin rashes, or neuropathy. Compliant with  medications. Does not smoke."  Hospital Course:   Summary of his active problems in the hospital is as following. Severe aortic stenosis -Possibly cause of patient's syncope. -Noted on Echocardiogram and cardiac cath -CT surgery consulted and appreciated -CTA chest/abd/pelvis: severe thickening and calcification of the aortic valve. Aortic atherosclerosis, left main and LAD CAD.  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  Hypotension -possibly one cause of patient's syncope (along with aortic stenosis) -Unclear etiology for hypotension.  -upon admission denied fever, cough, chest pain, UR symptoms, no abdominal pain. Had some diarrhea few days ago which has resolved.    -Cardiology consulted and appreciated -Echocardiogram 04/29/2016: EF 55-60%, grade 1 diastolic dysfunction. Severe AS -Cefepime started empirically given that patient also had leukocytosis- discontinued 05/01/2016  Syncope  -D-Dimer 3.28, EKG unrevealing.  -CT head negative for acute intracranial abnormalities. -CT angio chest negative for central PE.  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 Widely patent vertebral arteries within the neck. There is a irregular curvilinear filling defect within the proximal left ICA, likely a small dissection flap (series 7, image 210). -Due to abnormal CT angio neck, vascular surgery consulted and appreciated  ?Left ICA dissection -CTA neck as above -Vascular surgery consulted and did not recommend further acute intervention at this time. Patient already on anticoagulation  -s/p L CEA 2014- follows with Dr. Darrick Penna   Dental caries -Dentistry consulted, s/p multiple tooth extractions 1/26  Elevated troponin/NSTEMI -Echo as above -R/LHC: mild non-obstructive CAD, severe aortic stenosis (mean gradient 33.65mmHg)  Hypertension  -on metoprolol,  Changing lasix to when necessary  Paroxysmal Atrial Fibrillation  -CHA2DS2-VASc score 6  -Continue anticoagulation, Eliquis held due to Cath and surgery, now resumed  Left humeral fracture -present on admission. Secondary to fall -Cast was placed on 12/20- patient sees Dr. Loralie Champagne at [facility] ortho -Called the office on 1/26, cast is supposed to removed on 05/20/16 -Left humeral xray: no change in displaced, angulated comminuted left humeral fracture   Hyperlipidemia -Continue statin  Peripheral Neuropathy -Continue Neurontin   Depression -Continue home Zoloft   Generalized  weakness -PT and OT consulted- rec SNF initially but now with improvement and Recommendation changed to home health  Constipation -continue bowel regimen -Resolved  Headache -Possibly related to lack of caffeine as patient drinks 3-4 cups of coffee per day -Resolved with caffeine.  All other chronic medical condition were stable during the hospitalization.  Patient was seen by physical therapy, who recommended home health, which was arranged by Child psychotherapist and case Production designer, theatre/television/film. On the day of the discharge the patient's home, and no other acute medical condition were reported by patient. the patient was felt safe to be discharge at home with home health.  Procedures and Results:  Transcatheter Aortic Valve Replacement - Percutaneous Right Transfemoral Approach: 05/06/2016  Multiple extraction of tooth numbers 7, 10, 23, 24, 25, and 26, 2 Quadrants of alveoloplasty  Echocardiogram Study Conclusions  - Left ventricle: The cavity size was normal. Wall thickness was   increased in a pattern of mild LVH. Systolic function was normal.   The estimated ejection fraction was in the range of 60% to 65%. 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 No   perivalvular regurgitation. Gradient are a bit high and have   increased since OR   leaflets not well seen. Valve area (VTI): 1.57 cm^2. Valve area   (Vmax): 1.4 cm^2. Valve area (Vmean): 1.37 cm^2. - Atrial septum: No defect or patent foramen ovale was identified. - Pulmonary arteries: PA peak pressure: 32 mm Hg (S).   TEE Pre TAVR: Normal EF. No effusion. No LAA thrombus. Mld MR Normal  aortic root. More moderate appearing AS with suboptimal   transgastric doppler   Peak velocity 3 m/sec, mean gradient 17 mmHg peak gradient 32 AVA   1.3 cm2 with moderate AR     Post TAVR: 23 mmSapian 3 valve in good position. After post   dilatation no significant peri valvular regurgitation. Peak   velocity 2 m/sec mean gradient   10  mmHg and peak gradient 17 mmHg AVA 3.2 cm2. MR remained mild   EF normal with no new RWMA;s no arotic root trauma and no new   pericardial   effusion   Cardiac catheterization 1. Mild non-obstructive CAD 2. Severe aortic stenosis (mean gradient 33.36mmHg, AVA 1.02 cm2).   Recommendations: It is likely that his syncope is due to his aortic stenosis. I will ask the primary team to call CT surgery in the am. He may not be a good candidate for surgical AVR. Dr. Cornelius Moras or Dr. Laneta Simmers should be asked to see him so they can discuss AVR vs TAVR. If he is not felt to be a surgical candidate, I will be glad to see him in consultation as well to discuss TAVR.   Consultations:  Cardiology, cardiac surgery, dentistry  DISCHARGE MEDICATION: Discharge Medication List as of 05/09/2016 11:56 AM    START taking these medications   Details  Amino Acids-Protein Hydrolys (FEEDING SUPPLEMENT, PRO-STAT SUGAR FREE 64,) LIQD Take 30 mLs by mouth 3 (three) times daily between meals., Starting Fri 05/09/2016, Normal    amiodarone (PACERONE) 200 MG tablet Take 1 tablet (200 mg total) by mouth daily., Starting Fri 05/09/2016, Normal    feeding supplement, ENSURE ENLIVE, (ENSURE ENLIVE) LIQD Take 237 mLs by mouth daily., Starting Fri 05/09/2016, Normal    pantoprazole (PROTONIX) 40 MG tablet Take 1 tablet (40 mg total) by mouth daily., Starting Fri 05/09/2016, Normal    polyethylene glycol (MIRALAX / GLYCOLAX) packet Take 17 g by mouth daily., Starting Sat 05/10/2016, Normal      CONTINUE these medications which have CHANGED   Details  furosemide (LASIX) 20 MG tablet Take 1 tablet (20 mg total) by mouth daily as needed for fluid or edema (weight gain of 3 Lbs in 1 day)., Starting Fri 05/09/2016, Normal    metoprolol tartrate (LOPRESSOR) 25 MG tablet Take 1 tablet (25 mg total) by mouth 2 (two) times daily., Starting Fri 05/09/2016, Normal      CONTINUE these medications which have NOT CHANGED   Details  ALPRAZolam (XANAX)  0.5 MG tablet Take 0.5 mg by mouth at bedtime. , Historical Med    apixaban (ELIQUIS) 5 MG TABS tablet Take 1 tablet (5 mg total) by mouth 2 (two) times daily., Starting 05/31/2014, Until Discontinued, Normal    atorvastatin (LIPITOR) 40 MG tablet Take 1 tablet (40 mg total) by mouth daily at 6 PM., Starting 05/31/2014, Until Discontinued, Normal    buPROPion (WELLBUTRIN SR) 150 MG 12 hr tablet Take 450 mg by mouth daily. , Historical Med    gabapentin (NEURONTIN) 800 MG tablet Take 800 mg by mouth 3 (three) times daily. , Historical Med    hydrOXYzine (ATARAX/VISTARIL) 25 MG tablet Take 25 mg by mouth 3 (three) times daily as needed., Historical Med    oxycodone (OXY-IR) 5 MG capsule Take 5 mg by mouth every 4 (four) hours as needed for pain. , Historical Med    sertraline (ZOLOFT) 100 MG tablet Take 150 mg by mouth daily. , Historical Med    traMADol (ULTRAM) 50 MG tablet  Take 50 mg by mouth every 6 (six) hours as needed for moderate pain. , Historical Med       Allergies  Allergen Reactions  . Coumadin [Warfarin Sodium] Rash   Discharge Instructions    Amb Referral to Cardiac Rehabilitation    Complete by:  As directed    Diagnosis:  Valve Replacement   Valve:  Aortic Comment - s/p TAVR   Diet - low sodium heart healthy    Complete by:  As directed    Discharge instructions    Complete by:  As directed    It is important that you read following instructions as well as go over your medication list with RN to help you understand your care after this hospitalization.  Discharge Instructions: Please follow-up with PCP in one week  Please request your primary care physician to go over all Hospital Tests and Procedure/Radiological results at the follow up,  Please get all Hospital records sent to your PCP by signing hospital release before you go home.   Do not take more than prescribed Pain, Sleep and Anxiety Medications. You were cared for by a hospitalist during your hospital  stay. If you have any questions about your discharge medications or the care you received while you were in the hospital after you are discharged, you can call the unit and ask to speak with the hospitalist on call if the hospitalist that took care of you is not available.  Once you are discharged, your primary care physician will handle any further medical issues. Please note that NO REFILLS for any discharge medications will be authorized once you are discharged, as it is imperative that you return to your primary care physician (or establish a relationship with a primary care physician if you do not have one) for your aftercare needs so that they can reassess your need for medications and monitor your lab values. You Must read complete instructions/literature along with all the possible adverse reactions/side effects for all the Medicines you take and that have been prescribed to you. Take any new Medicines after you have completely understood and accept all the possible adverse reactions/side effects. Wear Seat belts while driving. If you have smoked or chewed Tobacco in the last 2 yrs please stop smoking and/or stop any Recreational drug use.   Increase activity slowly    Complete by:  As directed      Discharge Exam: Filed Weights   05/06/16 0551 05/07/16 0500 05/08/16 0428  Weight: (!) 141.5 kg (312 lb) (!) 142.5 kg (314 lb 2.5 oz) (!) 145.1 kg (319 lb 14.2 oz)   Vitals:   05/09/16 1027 05/09/16 1100  BP: (!) 110/59   Pulse: 63   Resp:  18  Temp:  97.8 F (36.6 C)   General: Appear in no distress, no Rash; Oral Mucosa moist. Cardiovascular: S1 and S2 Present, aortic systolic Murmur, no JVD Respiratory: Bilateral Air entry present and Clear to Auscultation, no Crackles, no wheezes Abdomen: Bowel Sound present, Soft and no tenderness Extremities: no Pedal edema, no calf tenderness Neurology: Grossly no focal neuro deficit.  The results of significant diagnostics from this  hospitalization (including imaging, microbiology, ancillary and laboratory) are listed below for reference.    Significant Diagnostic Studies: Ct Angio Head W Or Wo Contrast  Result Date: 04/29/2016 CLINICAL DATA:  Initial evaluation for acute syncope. EXAM: CT ANGIOGRAPHY HEAD AND NECK TECHNIQUE: Multidetector CT imaging of the head and neck was performed using the standard protocol during bolus  administration of intravenous contrast. Multiplanar CT image reconstructions and MIPs were obtained to evaluate the vascular anatomy. Carotid stenosis measurements (when applicable) are obtained utilizing NASCET criteria, using the distal internal carotid diameter as the denominator. CONTRAST:  110 cc of Isovue 370. COMPARISON:  Prior head CT from 04/28/16. FINDINGS: CTA NECK FINDINGS Aortic arch: Visualized aortic arch of normal caliber with normal 3 vessel morphology. Mild atheromatous plaque within the arch itself. No high-grade stenosis about the origin of the great vessels. Focal kinking with atheromatous plaque at the proximal right subclavian artery just be on the origin of the right common carotid artery. Secondary short-segment stenosis of approximately 70-80% (series 7, image 326). Subclavian arteries otherwise widely patent. Right carotid system: Right common carotid artery tortuous proximally but widely patent to the bifurcation. Atheromatous plaque about the right bifurcation/proximal right ICA. Associated multifocal irregularity with stenosis of up to 40-50% by NASCET criteria. Superimposed small penetrating plaque noted within this region (series 7, image 227). Distally, right ICA widely patent to the skullbase. No made of atheromatous plaque at the origin of the right external carotid artery without flow-limiting stenosis. Left carotid system: Left common carotid artery patent from its origin to the bifurcation. Eccentric calcified plaque about the left bifurcation/proximal left ICA with associated  narrowing of approximately 30% by NASCET criteria. There is a irregular curvilinear filling defect within the proximal left ICA, likely a small dissection flap (series 7, image 210). No associated flow-limiting stenosis or significant intraluminal thrombus. This is of unknown chronicity. Distally, left ICA widely patent to the skullbase without additional stenosis. Vertebral arteries: Both of the vertebral arteries arise from the subclavian arteries. Vertebral arteries somewhat poorly evaluated proximally due to body habitus. Vertebral arteries patent within the neck without flow-limiting stenosis, dissection, or occlusion. Skeleton: No acute osseous abnormality. No worrisome lytic or blastic osseous lesions. Other neck: No acute soft tissue abnormality within the neck. No adenopathy. Upper chest: Visualized upper mediastinum within normal limits. Visualized lung apices are clear. Review of the MIP images confirms the above findings CTA HEAD FINDINGS Anterior circulation: Petrous segments widely patent bilaterally. Scattered atheromatous plaque within the cavernous ICAs without flow-limiting stenosis. A1 segments patent. Anterior communicating artery patent. Anterior cerebral arteries demonstrate mild multifocal atheromatous irregularity but are patent to their distal aspects. M1 segments widely patent without stenosis or occlusion. MCA bifurcations normal. Distal MCA branches well opacified and symmetric. Mild distal small vessel atheromatous irregularity. Posterior circulation: Vertebral arteries patent to the vertebrobasilar junction without flow-limiting stenosis. Posterior inferior cerebral arteries patent proximally. Basilar artery widely patent without stenosis or occlusion. Superior cerebral arteries patent bilaterally. Left PCA supplied via the basilar and is well opacified to its distal aspect. Fetal type right PCA supplied via a widely patent right P com. Right PCA also patent to its distal aspect. Mild  distal small vessel atheromatous irregularity. Venous sinuses: Patent. Anatomic variants: Fetal type right PCA. No aneurysm or vascular malformation. Delayed phase: No pathologic enhancement. Review of the MIP images confirms the above findings IMPRESSION: CTA NECK IMPRESSION: 1. 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 Finding is of unknown chronicity. 2. 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 3. 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 4. Widely patent vertebral arteries within the neck. CTA HEAD IMPRESSION: 1. Negative CTA of the intracranial circulation for large vessel occlusion or high-grade correctable stenosis. 2. Mild distal small  vessel atheromatous irregularity involving both the anterior and posterior circulations. 3. Atheromatous plaque within the carotid siphons without significant stenosis. Electronically Signed   By: Rise Mu M.D.   On: 04/29/2016 04:28   Dg Orthopantogram  Result Date: 05/01/2016 CLINICAL DATA:  Preoperative dental evaluation EXAM: ORTHOPANTOGRAM/PANORAMIC COMPARISON:  CT 04/28/2016 FINDINGS: Residual dentition shows possible decay at teeth 7 and 10. Mandibular dentition shows a small radicular cyst at tooth 29. IMPRESSION: Possible advanced decay teeth 7 and 10. Small radicular cyst tooth 29. Electronically Signed   By: Paulina Fusi M.D.   On: 05/01/2016 13:01   Dg Chest 1 View  Result Date: 04/28/2016 CLINICAL DATA:  Syncopal episode while shaving this morning. LEFT arm pain. History of recent humerus fracture. EXAM: CHEST 1 VIEW COMPARISON:  Chest radiograph March 21, 2016 FINDINGS: Cardiac silhouette is upper limits of normal in size. Mediastinal silhouette is nonsuspicious, mildly calcified aortic knob. Persistently elevated RIGHT hemidiaphragm with bibasilar strandy densities. Rounded density  projects in RIGHT lung apex. No pneumothorax. Soft tissue planes and included osseous structures are nonsuspicious. IMPRESSION: Rounded density projecting RIGHT lung apex could represent EEG lead sticker, less likely consolidation/pneumonia. Persistently elevated RIGHT hemidiaphragm with bibasilar atelectasis. Borderline cardiomegaly. Electronically Signed   By: Awilda Metro M.D.   On: 04/28/2016 13:29   Dg Forearm Left  Result Date: 05/03/2016 CLINICAL DATA:  Humeral fracture 5 weeks ago.  In cast. EXAM: LEFT FOREARM - 2 VIEW COMPARISON:  None. FINDINGS: In cast views of the left forearm demonstrate no acute bony abnormality. No fracture, subluxation or dislocation. IMPRESSION: No acute bony abnormality. Electronically Signed   By: Charlett Nose M.D.   On: 05/03/2016 12:51   Ct Head Wo Contrast  Result Date: 04/28/2016 CLINICAL DATA:  Trauma, fall while walking dog EXAM: CT HEAD WITHOUT CONTRAST CT CERVICAL SPINE WITHOUT CONTRAST TECHNIQUE: Multidetector CT imaging of the head and cervical spine was performed following the standard protocol without intravenous contrast. Multiplanar CT image reconstructions of the cervical spine were also generated. COMPARISON:  CT head dated 03/19/2016. CT head/ cervical spine dated 03/16/2016. FINDINGS: CT HEAD FINDINGS Brain: No evidence of acute infarction, hemorrhage, hydrocephalus, extra-axial collection or mass lesion/mass effect. Subcortical white matter and periventricular small vessel ischemic changes. Global cortical atrophy. Vascular: Mild intracranial atherosclerosis. Skull: Normal. Negative for fracture or focal lesion. Sinuses/Orbits: Mild partial opacification of the right maxillary sinus. Visualized paranasal sinuses and mastoid air cells are otherwise clear. Other: None. CT CERVICAL SPINE FINDINGS Alignment: Normal cervical lordosis. Skull base and vertebrae: No acute fracture. No primary bone lesion or focal pathologic process. Soft tissues and spinal  canal: No prevertebral fluid or swelling. No visible canal hematoma. Disc levels: Mild degenerative changes. Spinal canal remains patent. Upper chest: Visualized lung apices are essentially clear. Other: Visualized thyroid is unremarkable. IMPRESSION: No evidence of acute intracranial abnormality. Atrophy with small vessel ischemic changes. No evidence of traumatic injury to the cervical spine. Mild degenerative changes. Electronically Signed   By: Charline Bills M.D.   On: 04/28/2016 13:16   Ct Angio Neck W Or Wo Contrast  Result Date: 04/29/2016 CLINICAL DATA:  Initial evaluation for acute syncope. EXAM: CT ANGIOGRAPHY HEAD AND NECK TECHNIQUE: Multidetector CT imaging of the head and neck was performed using the standard protocol during bolus administration of intravenous contrast. Multiplanar CT image reconstructions and MIPs were obtained to evaluate the vascular anatomy. Carotid stenosis measurements (when applicable) are obtained utilizing NASCET criteria, using the distal internal carotid diameter as the  denominator. CONTRAST:  110 cc of Isovue 370. COMPARISON:  Prior head CT from 04/28/16. FINDINGS: CTA NECK FINDINGS Aortic arch: Visualized aortic arch of normal caliber with normal 3 vessel morphology. Mild atheromatous plaque within the arch itself. No high-grade stenosis about the origin of the great vessels. Focal kinking with atheromatous plaque at the proximal right subclavian artery just be on the origin of the right common carotid artery. Secondary short-segment stenosis of approximately 70-80% (series 7, image 326). Subclavian arteries otherwise widely patent. Right carotid system: Right common carotid artery tortuous proximally but widely patent to the bifurcation. Atheromatous plaque about the right bifurcation/proximal right ICA. Associated multifocal irregularity with stenosis of up to 40-50% by NASCET criteria. Superimposed small penetrating plaque noted within this region (series 7, image  227). Distally, right ICA widely patent to the skullbase. No made of atheromatous plaque at the origin of the right external carotid artery without flow-limiting stenosis. Left carotid system: Left common carotid artery patent from its origin to the bifurcation. Eccentric calcified plaque about the left bifurcation/proximal left ICA with associated narrowing of approximately 30% by NASCET criteria. There is a irregular curvilinear filling defect within the proximal left ICA, likely a small dissection flap (series 7, image 210). No associated flow-limiting stenosis or significant intraluminal thrombus. This is of unknown chronicity. Distally, left ICA widely patent to the skullbase without additional stenosis. Vertebral arteries: Both of the vertebral arteries arise from the subclavian arteries. Vertebral arteries somewhat poorly evaluated proximally due to body habitus. Vertebral arteries patent within the neck without flow-limiting stenosis, dissection, or occlusion. Skeleton: No acute osseous abnormality. No worrisome lytic or blastic osseous lesions. Other neck: No acute soft tissue abnormality within the neck. No adenopathy. Upper chest: Visualized upper mediastinum within normal limits. Visualized lung apices are clear. Review of the MIP images confirms the above findings CTA HEAD FINDINGS Anterior circulation: Petrous segments widely patent bilaterally. Scattered atheromatous plaque within the cavernous ICAs without flow-limiting stenosis. A1 segments patent. Anterior communicating artery patent. Anterior cerebral arteries demonstrate mild multifocal atheromatous irregularity but are patent to their distal aspects. M1 segments widely patent without stenosis or occlusion. MCA bifurcations normal. Distal MCA branches well opacified and symmetric. Mild distal small vessel atheromatous irregularity. Posterior circulation: Vertebral arteries patent to the vertebrobasilar junction without flow-limiting stenosis.  Posterior inferior cerebral arteries patent proximally. Basilar artery widely patent without stenosis or occlusion. Superior cerebral arteries patent bilaterally. Left PCA supplied via the basilar and is well opacified to its distal aspect. Fetal type right PCA supplied via a widely patent right P com. Right PCA also patent to its distal aspect. Mild distal small vessel atheromatous irregularity. Venous sinuses: Patent. Anatomic variants: Fetal type right PCA. No aneurysm or vascular malformation. Delayed phase: No pathologic enhancement. Review of the MIP images confirms the above findings IMPRESSION: CTA NECK IMPRESSION: 1. 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 Finding is of unknown chronicity. 2. 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 3. 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 4. Widely patent vertebral arteries within the neck. CTA HEAD IMPRESSION: 1. Negative CTA of the intracranial circulation for large vessel occlusion or high-grade correctable stenosis. 2. Mild distal small vessel atheromatous irregularity involving both the anterior and posterior circulations. 3. Atheromatous plaque within the carotid siphons without significant stenosis. Electronically Signed   By: Rise Mu M.D.   On: 04/29/2016 04:28  Ct Angio Chest Pe W Or Wo Contrast  Result Date: 04/29/2016 CLINICAL DATA:  71 year old male with elevated D-dimer. EXAM: CT ANGIOGRAPHY CHEST WITH CONTRAST TECHNIQUE: Multidetector CT imaging of the chest was performed using the standard protocol during bolus administration of intravenous contrast. Multiplanar CT image reconstructions and MIPs were obtained to evaluate the vascular anatomy. CONTRAST:  100 cc Isovue 370 COMPARISON:  Chest radiograph dated 04/28/2016 FINDINGS: Cardiovascular: There is mild cardiomegaly. No  pericardial effusion. Mild coronary vascular calcification predominantly involving the LAD. Mild atherosclerotic calcification of the thoracic aorta. No aneurysmal dilatation or evidence of dissection. The origins of the great vessels of the aortic arch appear patent. Evaluation of the pulmonary arteries is limited due to suboptimal opacification of the distal branches. No large central pulmonary artery embolus identified. Mediastinum/Nodes: There is no hilar or mediastinal adenopathy. The esophagus is grossly unremarkable. Lungs/Pleura: Right lung base subsegmental atelectatic changes noted. Infiltrate is less likely but not excluded. Clinical correlation is recommended. There is no pleural effusion or pneumothorax. The central airways are patent. Upper Abdomen: There is diffuse fatty infiltration of the liver. Slight irregularity of the hepatic contour concerning for cirrhosis. There is mild eventration of the right hemidiaphragm. Musculoskeletal: Mild degenerative changes of the spine. No acute fracture. Review of the MIP images confirms the above findings. IMPRESSION: No CT evidence of central pulmonary artery embolus. Mild elevation of the right hemidiaphragm with right lung base subsegmental atelectasis/ scarring. Infiltrate is less likely. Clinical correlation is recommended. Electronically Signed   By: Elgie Collard M.D.   On: 04/29/2016 04:17   Ct Cervical Spine Wo Contrast  Result Date: 04/28/2016 CLINICAL DATA:  Trauma, fall while walking dog EXAM: CT HEAD WITHOUT CONTRAST CT CERVICAL SPINE WITHOUT CONTRAST TECHNIQUE: Multidetector CT imaging of the head and cervical spine was performed following the standard protocol without intravenous contrast. Multiplanar CT image reconstructions of the cervical spine were also generated. COMPARISON:  CT head dated 03/19/2016. CT head/ cervical spine dated 03/16/2016. FINDINGS: CT HEAD FINDINGS Brain: No evidence of acute infarction, hemorrhage, hydrocephalus,  extra-axial collection or mass lesion/mass effect. Subcortical white matter and periventricular small vessel ischemic changes. Global cortical atrophy. Vascular: Mild intracranial atherosclerosis. Skull: Normal. Negative for fracture or focal lesion. Sinuses/Orbits: Mild partial opacification of the right maxillary sinus. Visualized paranasal sinuses and mastoid air cells are otherwise clear. Other: None. CT CERVICAL SPINE FINDINGS Alignment: Normal cervical lordosis. Skull base and vertebrae: No acute fracture. No primary bone lesion or focal pathologic process. Soft tissues and spinal canal: No prevertebral fluid or swelling. No visible canal hematoma. Disc levels: Mild degenerative changes. Spinal canal remains patent. Upper chest: Visualized lung apices are essentially clear. Other: Visualized thyroid is unremarkable. IMPRESSION: No evidence of acute intracranial abnormality. Atrophy with small vessel ischemic changes. No evidence of traumatic injury to the cervical spine. Mild degenerative changes. Electronically Signed   By: Charline Bills M.D.   On: 04/28/2016 13:16   Ct Coronary Morph W/cta Cor W/score W/ca W/cm &/or Wo/cm  Addendum Date: 05/05/2016   ADDENDUM REPORT: 05/05/2016 08:14 ADDENDUM: Please see separate dictation for contemporaneously obtained CTA of the chest, abdomen and pelvis 05/03/2016 for full description of extracardiac findings. Electronically Signed   By: Trudie Reed M.D.   On: 05/05/2016 08:14   Result Date: 05/05/2016 CLINICAL DATA:  Aortic stenosis EXAM: Cardiac TAVR CT TECHNIQUE: The patient was scanned on a Philips 256 scanner. A 120 kV retrospective scan was triggered in the descending thoracic aorta at 111 HU's.  Gantry rotation speed was 270 msecs and collimation was .9 mm. No beta blockade or nitro were given. The 3D data set was reconstructed in 5% intervals of the R-R cycle. Systolic and diastolic phases were analyzed on a dedicated work station using MPR, MIP and  VRT modes. The patient received 80 cc of contrast. FINDINGS: Aortic Valve: Tri leaflet moderately calcified No significant calcification of annulus. Aorta: No aneurysmal dilatation mildly calcified arch normal origin of great vessels Sinotubular Junction:  27.7 mm Ascending Thoracic Aorta:  35.5 mm Aortic Arch:  28 mm Descending Thoracic Aorta:  28 mm Sinus of Valsalva Measurements: Non-coronary:  30 mm Right -coronary:  30 mm Left -coronary:  27 mm Coronary Artery Height above Annulus: Left Main:  14 mm above annulus Right Coronary:  13.7 mm above annulus Virtual Basal Annulus Measurements: Maximum/Minimum Diameter:  25 mm x 20 mm Perimeter:  72 mm Area:  378 mm 2 Coronary Arteries:  Sufficient height above annulus for deployment Optimum Fluoroscopic Angle for Delivery: LAO 19 degrees Cranial 8 degrees IMPRESSION: 1) Poor quality motion degraded study 2) Trileaflet aortic valve with annulus of 378 mm2 suitable for a 23 mm Sapien 3 valve 3) Sufficient coronary height for deployment 4) No LAA thrombus 5) Optimum angle for delivery LAO 19 degrees Cranial 8 degrees Charlton Haws Electronically Signed: By: Charlton Haws M.D. On: 05/05/2016 07:48   Dg Chest Port 1 View  Result Date: 05/07/2016 CLINICAL DATA:  Chest soreness. EXAM: PORTABLE CHEST 1 VIEW COMPARISON:  05/06/2016. FINDINGS: Right IJ line noted in good anatomic position. Cardiomegaly with normal pulmonary vascularity. Aortic valve prosthesis again noted. Low lung volumes with basilar atelectasis. Mild basilar infiltrates cannot be excluded. IMPRESSION: 1.  Right IJ line stable position. 2. Stable cardiomegaly.  Aortic valve prosthesis again noted. 3. Stable basilar atelectasis. Electronically Signed   By: Maisie Fus  Register   On: 05/07/2016 08:13   Dg Chest Port 1 View  Result Date: 05/06/2016 CLINICAL DATA:  Status post transcatheter aortic valve replacement. EXAM: PORTABLE CHEST 1 VIEW COMPARISON:  Radiograph of April 29, 2016. FINDINGS: Stable  cardiomediastinal silhouette. Aortic valve prosthesis is noted. No pneumothorax or pleural effusion is noted. Hypoinflation of the lungs is noted with bibasilar subsegmental atelectasis. Right internal jugular catheter is noted with distal tip in expected position of the SVC. Bony thorax is unremarkable. IMPRESSION: Aortic valve prosthesis is noted. Hypoinflation the lungs is noted with bibasilar subsegmental atelectasis. Electronically Signed   By: Lupita Raider, M.D.   On: 05/06/2016 15:06   Dg Chest Port 1 View  Result Date: 04/29/2016 CLINICAL DATA:  Acute onset shortness of Breath EXAM: PORTABLE CHEST 1 VIEW COMPARISON:  04/29/2016, 04/28/2016 FINDINGS: Cardiac shadow is again enlarged but stable. Elevation of the right hemidiaphragm is again noted with mild atelectatic changes. No focal confluent infiltrate is seen. No other focal abnormality is noted IMPRESSION: Stable right basilar atelectasis and elevation the right hemidiaphragm. No acute abnormality is seen. Electronically Signed   By: Alcide Clever M.D.   On: 04/29/2016 09:20   Dg Humerus Left  Result Date: 05/03/2016 CLINICAL DATA:  Fracture EXAM: LEFT HUMERUS - 2+ VIEW COMPARISON:  04/28/2016 FINDINGS: Comminuted displaced and angulated fracture through the left humerus again noted. Exuberant callus formation noted. No real change since prior study. IMPRESSION: No change in the appearance of the displaced, angulated comminuted left humeral fracture. Electronically Signed   By: Charlett Nose M.D.   On: 05/03/2016 12:42   Dg Humerus Left  Result Date: 04/28/2016 CLINICAL DATA:  Syncopal episode while shaving this morning. LEFT arm pain. History of recent humerus fracture. EXAM: LEFT HUMERUS - 2+ VIEW COMPARISON:  LEFT humerus fracture March 20, 2016 FINDINGS: Healing displaced proximal humerus fracture with callus, rarefaction of the fracture line. No new fracture deformity. No dislocation. Distal humerus in fiberglass cast. No destructive  bony lesions. Soft tissue planes are nonsuspicious. IMPRESSION: Healing displaced proximal LEFT humerus fracture ; no new fracture deformity or dislocation. Electronically Signed   By: Awilda Metro M.D.   On: 04/28/2016 13:31   Ct Angio Chest Aorta W/cm &/or Wo/cm  Result Date: 05/05/2016 CLINICAL DATA:  71 year old male with history of severe aortic stenosis. Preprocedural study prior to potential transcatheter aortic valve replacement (TAVR) procedure. Shortness of breath on exertion. Syncopal episodes. EXAM: CT ANGIOGRAPHY CHEST, ABDOMEN AND PELVIS TECHNIQUE: Multidetector CT imaging through the chest, abdomen and pelvis was performed using the standard protocol during bolus administration of intravenous contrast. Multiplanar reconstructed images and MIPs were obtained and reviewed to evaluate the vascular anatomy. CONTRAST:  70 mL of Isovue 370. COMPARISON:  CT the abdomen and pelvis 05/08/2015. Chest CT 04/12/2006. FINDINGS: CTA CHEST FINDINGS Cardiovascular: Heart size is normal. There is no significant pericardial fluid, thickening or pericardial calcification. There is aortic atherosclerosis, as well as atherosclerosis of the great vessels of the mediastinum and the coronary arteries, including calcified atherosclerotic plaque in the left main and left anterior descending coronary arteries. Severe thickening calcification of the aortic valve. Mediastinum/Lymph Nodes: No pathologically enlarged mediastinal or hilar lymph nodes. Esophagus is unremarkable in appearance. No axillary lymphadenopathy. Lungs/Pleura: No suspicious appearing pulmonary nodules or masses are noted. No acute consolidative airspace disease. No pleural effusions. Areas of bibasilar scarring are noted in the lower lobes (right greater than left). Musculoskeletal/Soft Tissues: There are no aggressive appearing lytic or blastic lesions noted in the visualized portions of the skeleton. CTA ABDOMEN AND PELVIS FINDINGS Hepatobiliary:  No suspicious appearing cystic or solid hepatic lesions. No intra or extrahepatic biliary ductal dilatation. Gallbladder is normal in appearance. Pancreas: No pancreatic mass. No pancreatic ductal dilatation. No pancreatic or peripancreatic fluid or inflammatory changes. Spleen: Unremarkable. Adrenals/Urinary Tract: Bilateral adrenal glands and bilateral kidneys are normal in appearance. There is no hydroureteronephrosis. Urinary bladder is normal in appearance. Stomach/Bowel: The appearance of the stomach is normal. There is no pathologic dilatation of small bowel or colon. The appendix is not confidently identified and may be surgically absent. Regardless, there are no inflammatory changes noted adjacent to the cecum to suggest the presence of an acute appendicitis at this time. Vascular/Lymphatic: Aortic atherosclerosis, with vascular findings and measurements pertinent to potential TAVR procedure, as detailed below. No aneurysm or dissection noted in the abdominal or pelvic vasculature. No lymphadenopathy noted in the abdomen or pelvis. Reproductive: Prostate gland and seminal vesicles are unremarkable in appearance. Other: No significant volume of ascites.  No pneumoperitoneum. Musculoskeletal: Interbody cages are noted at L5-S1. There are no aggressive appearing lytic or blastic lesions noted in the visualized portions of the skeleton. VASCULAR MEASUREMENTS PERTINENT TO TAVR: Comment: Assessment of the pelvic vasculature is limited on this examination due to a combination of excessive image noise from the patient's large body habitus, and suboptimal contrast bolus. AORTA: Minimal Aortic Diameter - 12 x 17 mm Severity of Aortic Calcification - Moderate to severe RIGHT PELVIS: Right Common Iliac Artery - Minimal Diameter - 9.0 x 8.7 mm Tortuosity - Mild to moderate Calcification - Moderate Right External Iliac Artery -  Minimal Diameter - 8.7 x 7.7 mm Tortuosity - Mild to moderate Calcification - None Right  Common Femoral Artery - Minimal Diameter - 8.4 x 9.1 mm Tortuosity - Mild Calcification - None LEFT PELVIS: Left Common Iliac Artery - Minimal Diameter - 9.5 x 10.0 mm Tortuosity - Mild to moderate Calcification - Mild Left External Iliac Artery - Minimal Diameter - 10.5 x 9.4 mm Tortuosity - Mild to moderate Calcification - None Left Common Femoral Artery - Minimal Diameter - 10.2 x 8.8 mm Tortuosity - Mild Calcification - None Review of the MIP images confirms the above findings. IMPRESSION: 1. Vascular findings and measurements pertinent to potential TAVR procedure, as detailed above. Despite the limitations of today's examination there does appear to be suitable pelvic arterial access bilaterally. 2. Severe thickening and calcification of the aortic valve, compatible with the reported clinical history of severe aortic stenosis. 3. Aortic atherosclerosis, in addition to left main and left anterior descending coronary artery disease. Please note that although the presence of coronary artery calcium documents the presence of coronary artery disease, the severity of this disease and any potential stenosis cannot be assessed on this non-gated CT examination. Assessment for potential risk factor modification, dietary therapy or pharmacologic therapy may be warranted, if clinically indicated. 4. No acute findings are noted in the chest, abdomen or pelvis. 5. Incidental findings, as above. Electronically Signed   By: Trudie Reed M.D.   On: 05/05/2016 09:47   Ct Angio Abd/pel W/ And/or W/o  Result Date: 05/05/2016 CLINICAL DATA:  71 year old male with history of severe aortic stenosis. Preprocedural study prior to potential transcatheter aortic valve replacement (TAVR) procedure. Shortness of breath on exertion. Syncopal episodes. EXAM: CT ANGIOGRAPHY CHEST, ABDOMEN AND PELVIS TECHNIQUE: Multidetector CT imaging through the chest, abdomen and pelvis was performed using the standard protocol during bolus  administration of intravenous contrast. Multiplanar reconstructed images and MIPs were obtained and reviewed to evaluate the vascular anatomy. CONTRAST:  70 mL of Isovue 370. COMPARISON:  CT the abdomen and pelvis 05/08/2015. Chest CT 04/12/2006. FINDINGS: CTA CHEST FINDINGS Cardiovascular: Heart size is normal. There is no significant pericardial fluid, thickening or pericardial calcification. There is aortic atherosclerosis, as well as atherosclerosis of the great vessels of the mediastinum and the coronary arteries, including calcified atherosclerotic plaque in the left main and left anterior descending coronary arteries. Severe thickening calcification of the aortic valve. Mediastinum/Lymph Nodes: No pathologically enlarged mediastinal or hilar lymph nodes. Esophagus is unremarkable in appearance. No axillary lymphadenopathy. Lungs/Pleura: No suspicious appearing pulmonary nodules or masses are noted. No acute consolidative airspace disease. No pleural effusions. Areas of bibasilar scarring are noted in the lower lobes (right greater than left). Musculoskeletal/Soft Tissues: There are no aggressive appearing lytic or blastic lesions noted in the visualized portions of the skeleton. CTA ABDOMEN AND PELVIS FINDINGS Hepatobiliary: No suspicious appearing cystic or solid hepatic lesions. No intra or extrahepatic biliary ductal dilatation. Gallbladder is normal in appearance. Pancreas: No pancreatic mass. No pancreatic ductal dilatation. No pancreatic or peripancreatic fluid or inflammatory changes. Spleen: Unremarkable. Adrenals/Urinary Tract: Bilateral adrenal glands and bilateral kidneys are normal in appearance. There is no hydroureteronephrosis. Urinary bladder is normal in appearance. Stomach/Bowel: The appearance of the stomach is normal. There is no pathologic dilatation of small bowel or colon. The appendix is not confidently identified and may be surgically absent. Regardless, there are no inflammatory  changes noted adjacent to the cecum to suggest the presence of an acute appendicitis at this time. Vascular/Lymphatic: Aortic  atherosclerosis, with vascular findings and measurements pertinent to potential TAVR procedure, as detailed below. No aneurysm or dissection noted in the abdominal or pelvic vasculature. No lymphadenopathy noted in the abdomen or pelvis. Reproductive: Prostate gland and seminal vesicles are unremarkable in appearance. Other: No significant volume of ascites.  No pneumoperitoneum. Musculoskeletal: Interbody cages are noted at L5-S1. There are no aggressive appearing lytic or blastic lesions noted in the visualized portions of the skeleton. VASCULAR MEASUREMENTS PERTINENT TO TAVR: Comment: Assessment of the pelvic vasculature is limited on this examination due to a combination of excessive image noise from the patient's large body habitus, and suboptimal contrast bolus. AORTA: Minimal Aortic Diameter - 12 x 17 mm Severity of Aortic Calcification - Moderate to severe RIGHT PELVIS: Right Common Iliac Artery - Minimal Diameter - 9.0 x 8.7 mm Tortuosity - Mild to moderate Calcification - Moderate Right External Iliac Artery - Minimal Diameter - 8.7 x 7.7 mm Tortuosity - Mild to moderate Calcification - None Right Common Femoral Artery - Minimal Diameter - 8.4 x 9.1 mm Tortuosity - Mild Calcification - None LEFT PELVIS: Left Common Iliac Artery - Minimal Diameter - 9.5 x 10.0 mm Tortuosity - Mild to moderate Calcification - Mild Left External Iliac Artery - Minimal Diameter - 10.5 x 9.4 mm Tortuosity - Mild to moderate Calcification - None Left Common Femoral Artery - Minimal Diameter - 10.2 x 8.8 mm Tortuosity - Mild Calcification - None Review of the MIP images confirms the above findings. IMPRESSION: 1. Vascular findings and measurements pertinent to potential TAVR procedure, as detailed above. Despite the limitations of today's examination there does appear to be suitable pelvic arterial access  bilaterally. 2. Severe thickening and calcification of the aortic valve, compatible with the reported clinical history of severe aortic stenosis. 3. Aortic atherosclerosis, in addition to left main and left anterior descending coronary artery disease. Please note that although the presence of coronary artery calcium documents the presence of coronary artery disease, the severity of this disease and any potential stenosis cannot be assessed on this non-gated CT examination. Assessment for potential risk factor modification, dietary therapy or pharmacologic therapy may be warranted, if clinically indicated. 4. No acute findings are noted in the chest, abdomen or pelvis. 5. Incidental findings, as above. Electronically Signed   By: Trudie Reed M.D.   On: 05/05/2016 09:47    Microbiology: Recent Results (from the past 240 hour(s))  Surgical pcr screen     Status: Abnormal   Collection Time: 05/01/16  7:47 PM  Result Value Ref Range Status   MRSA, PCR NEGATIVE NEGATIVE Final   Staphylococcus aureus POSITIVE (A) NEGATIVE Final    Comment:        The Xpert SA Assay (FDA approved for NASAL specimens in patients over 48 years of age), is one component of a comprehensive surveillance program.  Test performance has been validated by [facility] for patients greater than or equal to 28 year old. It is not intended to diagnose infection nor to guide or monitor treatment.      Labs: CBC:  Recent Labs Lab 05/05/16 0257 05/06/16 0334  05/06/16 1344 05/06/16 1440 05/06/16 1442 05/07/16 0422 05/08/16 0226  WBC 8.6 9.5  --   --  10.3  --  9.9 9.0  HGB 13.0 13.8  < > 13.9 13.0 13.6 12.8* 13.6  HCT 40.7 42.5  < > 41.0 40.3 40.0 39.4 41.7  MCV 94.9 93.8  --   --  93.7  --  94.0 95.4  PLT 255 245  --   --  232  --  227 191  < > = values in this interval not displayed. Basic Metabolic Panel:  Recent Labs Lab 05/05/16 0257 05/06/16 0334 05/06/16 1158 05/06/16 1253 05/06/16 1344  05/06/16 1442 05/07/16 0422 05/08/16 0226  NA 141 140 139 136 138 140 136 138  K 4.0 4.1 4.0 4.2 4.1 3.8 4.1 4.6  CL 103 103 99* 100* 100*  --  102 106  CO2 28 28  --   --   --   --  26 23  GLUCOSE 96 106* 99 149* 163* 140* 96 95  BUN 20 19 19 19 19   --  14 21*  CREATININE 0.89 0.90 0.70 0.80 0.90  --  0.80 0.91  CALCIUM 9.0 9.0  --   --   --   --  8.1* 8.4*  MG  --   --   --   --   --   --  2.8*  --    BNP (last 3 results)  Recent Labs  04/29/16 0856  BNP 54.4   CBG:  Recent Labs Lab 05/08/16 1647 05/08/16 2008 05/09/16 0009 05/09/16 0410 05/09/16 1131  GLUCAP 111* 112* 94 96 124*   Time spent: 30 minutes  Signed:  Lynden Oxford  [facility] 05/09/2016  2:40 PM

## 2016-05-15 ENCOUNTER — Ambulatory Visit: Payer: Medicare Other | Admitting: Cardiology

## 2016-05-23 ENCOUNTER — Ambulatory Visit: Payer: Medicare Other | Admitting: Cardiovascular Disease

## 2016-05-23 NOTE — Progress Notes (Deleted)
No chief complaint on file.   History of Present Illness: 71 yo male with history of PAF, HTN, HLD, morbid obesity, OSA, prior CVA and aortic stenosis admitted to [facility] 04/28/16 after a syncopal event. Neck CTA with non-flow limiting left ICA dissection but felt to be unrelated to syncope per Vascular surgery. He has had prior left CEA. He was found to have severe aortic valve stenosis. Cardiac cath April 30, 2016 with mid CAD. He underwent TAVR on 05/06/16 with placement of a 23 mm Edwards Sapien 3 bioprosthetic valve from the right femoral approach. He did well following the procedure.   He is here today for follow up.   Primary Care Physician: [name], PA-C  Primary Cardiologist: Dr. Eden Emms  Past Medical History:  Diagnosis Date  . Atrial fibrillation (HCC)    pt on Eliquis  . Chest pain   . CHF (congestive heart failure) (HCC)   . Edema   . HTN (hypertension)   . Hyperlipidemia   . Neuropathy (HCC)   . Obesity   . SOB (shortness of breath)   . Stroke [facility])     Past Surgical History:  Procedure Laterality Date  . BACK SURGERY    . CARDIAC CATHETERIZATION    . CARDIAC CATHETERIZATION N/A 04/30/2016   Procedure: Right/Left Heart Cath and Coronary Angiography;  Surgeon: Kathleene Hazel, MD;  Location: [facility] INVASIVE CV LAB;  Service: Cardiovascular;  Laterality: N/A;  . CAROTID ANGIOGRAM N/A 05/30/2014   Procedure: Dorise Bullion;  Surgeon: Nada Libman, MD;  Location: [facility] CATH LAB;  Service: Cardiovascular;  Laterality: N/A;  . MULTIPLE EXTRACTIONS WITH ALVEOLOPLASTY N/A 05/02/2016   Procedure: Extraction of tooth #'s 7, 10, 23, 24, 25,and 26 with alveoloplasty.;  Surgeon: Charlynne Pander, DDS;  [facility]: [facility] OR;  Service: Oral Surgery;  Laterality: N/A;  . TEE WITHOUT CARDIOVERSION N/A 05/06/2016   Procedure: TRANSESOPHAGEAL ECHOCARDIOGRAM (TEE);  Surgeon: Kathleene Hazel, MD;  Location: [facility] OR;  Service: Open Heart Surgery;  Laterality: N/A;  .  TRANSCATHETER AORTIC VALVE REPLACEMENT, TRANSFEMORAL N/A 05/06/2016   Procedure: TRANSCATHETER AORTIC VALVE REPLACEMENT, TRANSFEMORAL using a 23mm Edwards Sapien 3 Transcatheter Heart Valve;  Surgeon: Kathleene Hazel, MD;  Location: [facility] OR;  Service: Open Heart Surgery;  Laterality: N/A;    Current Outpatient Prescriptions  Medication Sig Dispense Refill  . ALPRAZolam (XANAX) 0.5 MG tablet Take 0.5 mg by mouth at bedtime.     . Amino Acids-Protein Hydrolys (FEEDING SUPPLEMENT, PRO-STAT SUGAR FREE 64,) LIQD Take 30 mLs by mouth 3 (three) times daily between meals. 900 mL 0  . amiodarone (PACERONE) 200 MG tablet Take 1 tablet (200 mg total) by mouth daily. 60 tablet 0  . apixaban (ELIQUIS) 5 MG TABS tablet Take 1 tablet (5 mg total) by mouth 2 (two) times daily. 60 tablet 0  . atorvastatin (LIPITOR) 40 MG tablet Take 1 tablet (40 mg total) by mouth daily at 6 PM. 30 tablet 0  . buPROPion (WELLBUTRIN SR) 150 MG 12 hr tablet Take 450 mg by mouth daily.     . feeding supplement, ENSURE ENLIVE, (ENSURE ENLIVE) LIQD Take 237 mLs by mouth daily. 237 mL 12  . furosemide (LASIX) 20 MG tablet Take 1 tablet (20 mg total) by mouth daily as needed for fluid or edema (weight gain of 3 Lbs in 1 day). 30 tablet 0  . gabapentin (NEURONTIN) 800 MG tablet Take 800 mg by mouth 3 (three) times daily.     Marland Kitchen  hydrOXYzine (ATARAX/VISTARIL) 25 MG tablet Take 25 mg by mouth 3 (three) times daily as needed.    . metoprolol tartrate (LOPRESSOR) 25 MG tablet Take 1 tablet (25 mg total) by mouth 2 (two) times daily. 60 tablet 0  . oxycodone (OXY-IR) 5 MG capsule Take 5 mg by mouth every 4 (four) hours as needed for pain.     . pantoprazole (PROTONIX) 40 MG tablet Take 1 tablet (40 mg total) by mouth daily. 30 tablet 0  . polyethylene glycol (MIRALAX / GLYCOLAX) packet Take 17 g by mouth daily. 14 each 0  . sertraline (ZOLOFT) 100 MG tablet Take 150 mg by mouth daily.     . traMADol (ULTRAM) 50 MG tablet Take 50 mg by  mouth every 6 (six) hours as needed for moderate pain.      No current facility-administered medications for this visit.     Allergies  Allergen Reactions  . Coumadin [Warfarin Sodium] Rash    Social History   Social History  . Marital status: Divorced    Spouse name: N/A  . Number of children: N/A  . Years of education: N/A   Occupational History  . Not on file.   Social History Main Topics  . Smoking status: Former Games developermoker  . Smokeless tobacco: Former NeurosurgeonUser    Quit date: 03/22/1996  . Alcohol use No  . Drug use: No  . Sexual activity: Not on file   Other Topics Concern  . Not on file   Social History Narrative  . No narrative on file    Family History  Problem Relation Age of Onset  . Heart attack Mother   . Stroke Father     Review of Systems:  As stated in the HPI and otherwise negative.   There were no vitals taken for this visit.  Physical Examination: General: Well developed, well nourished, NAD  HEENT: OP clear, mucus membranes moist  SKIN: warm, dry. No rashes. Neuro: No focal deficits  Musculoskeletal: Muscle strength 5/5 all ext  Psychiatric: Mood and affect normal  Neck: No JVD, no carotid bruits, no thyromegaly, no lymphadenopathy.  Lungs:Clear bilaterally, no wheezes, rhonci, crackles Cardiovascular: Regular rate and rhythm. No murmurs, gallops or rubs. Abdomen:Soft. Bowel sounds present. Non-tender.  Extremities: No lower extremity edema. Pulses are 2 + in the bilateral DP/PT.  EKG:  EKG {ACTION; IS/IS [id number]}OT:21021397} ordered today. The ekg ordered today demonstrates ***  Recent Labs: 04/28/2016: ALT 17 04/29/2016: B Natriuretic Peptide 54.4 05/07/2016: Magnesium 2.8 05/08/2016: BUN 21; Creatinine, Ser 0.91; Hemoglobin 13.6; Platelets 191; Potassium 4.6; Sodium 138   Lipid Panel    Component Value Date/Time   CHOL 147 05/25/2014 0844   TRIG 169 (H) 05/25/2014 0844   HDL 33 (L) 05/25/2014 0844   CHOLHDL 4.5 05/25/2014 0844   VLDL 34  05/25/2014 0844   LDLCALC 80 05/25/2014 0844     Wt Readings from Last 3 Encounters:  05/08/16 (!) 319 lb 14.2 oz (145.1 kg)  06/01/15 (!) 327 lb (148.3 kg)  05/10/15 (!) 327 lb 11.2 oz (148.6 kg)     Other studies Reviewed: Additional studies/ records that were reviewed today include: ***. Review of the above records demonstrates: ***  Assessment and Plan:   1. Severe aortic stenosis: He is now 2 weeks post TAVR. He is doing well. He is back on Eliquis. No bleeding issues. He is NYHA class 2***. Will plan echo in 2 weeks.   Current medicines are reviewed at length with the  patient today.  The patient {ACTIONS; HAS/DOES NOT HAVE:19233} concerns regarding medicines.  The following changes have been made:  {PLAN; NO CHANGE:13088:s}  Labs/ tests ordered today include: *** No orders of the defined types were placed in this encounter.   Disposition:   FU with *** in {gen number [id number]} {TIME; UNITS DAY/WEEK/MONTH:19136}  Signed, Verne Carrow, MD 05/23/2016 7:05 AM    [facility] [address] [place], [place], [place]  [address] Phone: [phone]; Fax: [phone]

## 2016-06-03 ENCOUNTER — Ambulatory Visit: Payer: Medicare Other | Admitting: Cardiology

## 2016-06-06 ENCOUNTER — Other Ambulatory Visit (HOSPITAL_COMMUNITY): Payer: Self-pay

## 2016-06-06 ENCOUNTER — Ambulatory Visit: Payer: Self-pay | Admitting: Cardiovascular Disease

## 2016-06-06 ENCOUNTER — Telehealth: Payer: Self-pay | Admitting: *Deleted

## 2016-06-06 NOTE — Telephone Encounter (Signed)
Pt was scheduled to see Dr. Clifton JamesMcAlhany today for echo and post TAVR appt. Pt cancelled these appts.  He has been rescheduled for first available echo appt on 06/23/16 at 8:30.  I spoke with pt and scheduled him to see Dr. Clifton JamesMcAlhany on 07/04/16 at 11:30. I explained to pt the importance of keeping these appointments.  I told him I would call him if an earlier echo appt became available.

## 2016-06-06 NOTE — Progress Notes (Deleted)
No chief complaint on file.   History of Present Illness: 71 yo male with history of PAF, HTN, HLD, morbid obesity, OSA, prior CVA and aortic stenosis admitted to [facility] 04/28/16 after a syncopal event. Neck CTA with non-flow limiting left ICA dissection but felt to be unrelated to syncope per Vascular surgery. He has had prior left CEA. He was found to have severe aortic valve stenosis. Cardiac cath April 30, 2016 with mid CAD. He underwent TAVR on 05/06/16 with placement of a 23 mm Edwards Sapien 3 bioprosthetic valve from the right femoral approach. He did well following the procedure.   He is here today for follow up.   Primary Care Physician: [name], PA-C  Primary Cardiologist: Dr. Eden Emms  Past Medical History:  Diagnosis Date  . Atrial fibrillation (HCC)    pt on Eliquis  . Chest pain   . CHF (congestive heart failure) (HCC)   . Edema   . HTN (hypertension)   . Hyperlipidemia   . Neuropathy (HCC)   . Obesity   . SOB (shortness of breath)   . Stroke [facility])     Past Surgical History:  Procedure Laterality Date  . BACK SURGERY    . CARDIAC CATHETERIZATION    . CARDIAC CATHETERIZATION N/A 04/30/2016   Procedure: Right/Left Heart Cath and Coronary Angiography;  Surgeon: Kathleene Hazel, MD;  Location: [facility] INVASIVE CV LAB;  Service: Cardiovascular;  Laterality: N/A;  . CAROTID ANGIOGRAM N/A 05/30/2014   Procedure: Dorise Bullion;  Surgeon: Nada Libman, MD;  Location: [facility] CATH LAB;  Service: Cardiovascular;  Laterality: N/A;  . MULTIPLE EXTRACTIONS WITH ALVEOLOPLASTY N/A 05/02/2016   Procedure: Extraction of tooth #'s 7, 10, 23, 24, 25,and 26 with alveoloplasty.;  Surgeon: Charlynne Pander, DDS;  Location: [facility] OR;  Service: Oral Surgery;  Laterality: N/A;  . TEE WITHOUT CARDIOVERSION N/A 05/06/2016   Procedure: TRANSESOPHAGEAL ECHOCARDIOGRAM (TEE);  Surgeon: Kathleene Hazel, MD;  Location: [facility] OR;  Service: Open Heart Surgery;  Laterality: N/A;  .  TRANSCATHETER AORTIC VALVE REPLACEMENT, TRANSFEMORAL N/A 05/06/2016   Procedure: TRANSCATHETER AORTIC VALVE REPLACEMENT, TRANSFEMORAL using a 23mm Edwards Sapien 3 Transcatheter Heart Valve;  Surgeon: Kathleene Hazel, MD;  Location: [facility] OR;  Service: Open Heart Surgery;  Laterality: N/A;    Current Outpatient Prescriptions  Medication Sig Dispense Refill  . ALPRAZolam (XANAX) 0.5 MG tablet Take 0.5 mg by mouth at bedtime.     . Amino Acids-Protein Hydrolys (FEEDING SUPPLEMENT, PRO-STAT SUGAR FREE 64,) LIQD Take 30 mLs by mouth 3 (three) times daily between meals. 900 mL 0  . amiodarone (PACERONE) 200 MG tablet Take 1 tablet (200 mg total) by mouth daily. 60 tablet 0  . apixaban (ELIQUIS) 5 MG TABS tablet Take 1 tablet (5 mg total) by mouth 2 (two) times daily. 60 tablet 0  . atorvastatin (LIPITOR) 40 MG tablet Take 1 tablet (40 mg total) by mouth daily at 6 PM. 30 tablet 0  . buPROPion (WELLBUTRIN SR) 150 MG 12 hr tablet Take 450 mg by mouth daily.     . feeding supplement, ENSURE ENLIVE, (ENSURE ENLIVE) LIQD Take 237 mLs by mouth daily. 237 mL 12  . furosemide (LASIX) 20 MG tablet Take 1 tablet (20 mg total) by mouth daily as needed for fluid or edema (weight gain of 3 Lbs in 1 day). 30 tablet 0  . gabapentin (NEURONTIN) 800 MG tablet Take 800 mg by mouth 3 (three) times daily.     Marland Kitchen  hydrOXYzine (ATARAX/VISTARIL) 25 MG tablet Take 25 mg by mouth 3 (three) times daily as needed.    . metoprolol tartrate (LOPRESSOR) 25 MG tablet Take 1 tablet (25 mg total) by mouth 2 (two) times daily. 60 tablet 0  . oxycodone (OXY-IR) 5 MG capsule Take 5 mg by mouth every 4 (four) hours as needed for pain.     . pantoprazole (PROTONIX) 40 MG tablet Take 1 tablet (40 mg total) by mouth daily. 30 tablet 0  . polyethylene glycol (MIRALAX / GLYCOLAX) packet Take 17 g by mouth daily. 14 each 0  . sertraline (ZOLOFT) 100 MG tablet Take 150 mg by mouth daily.     . traMADol (ULTRAM) 50 MG tablet Take 50 mg by  mouth every 6 (six) hours as needed for moderate pain.      No current facility-administered medications for this visit.     Allergies  Allergen Reactions  . Coumadin [Warfarin Sodium] Rash    Social History   Social History  . Marital status: Divorced    Spouse name: N/A  . Number of children: N/A  . Years of education: N/A   Occupational History  . Not on file.   Social History Main Topics  . Smoking status: Former Games developermoker  . Smokeless tobacco: Former NeurosurgeonUser    Quit date: 03/22/1996  . Alcohol use No  . Drug use: No  . Sexual activity: Not on file   Other Topics Concern  . Not on file   Social History Narrative  . No narrative on file    Family History  Problem Relation Age of Onset  . Heart attack Mother   . Stroke Father     Review of Systems:  As stated in the HPI and otherwise negative.   There were no vitals taken for this visit.  Physical Examination: General: Well developed, well nourished, NAD  HEENT: OP clear, mucus membranes moist  SKIN: warm, dry. No rashes. Neuro: No focal deficits  Musculoskeletal: Muscle strength 5/5 all ext  Psychiatric: Mood and affect normal  Neck: No JVD, no carotid bruits, no thyromegaly, no lymphadenopathy.  Lungs:Clear bilaterally, no wheezes, rhonci, crackles Cardiovascular: Regular rate and rhythm. No murmurs, gallops or rubs. Abdomen:Soft. Bowel sounds present. Non-tender.  Extremities: No lower extremity edema. Pulses are 2 + in the bilateral DP/PT.  EKG:  EKG {ACTION; IS/IS [id number]}OT:21021397} ordered today. The ekg ordered today demonstrates ***  Recent Labs: 04/28/2016: ALT 17 04/29/2016: B Natriuretic Peptide 54.4 05/07/2016: Magnesium 2.8 05/08/2016: BUN 21; Creatinine, Ser 0.91; Hemoglobin 13.6; Platelets 191; Potassium 4.6; Sodium 138   Lipid Panel    Component Value Date/Time   CHOL 147 05/25/2014 0844   TRIG 169 (H) 05/25/2014 0844   HDL 33 (L) 05/25/2014 0844   CHOLHDL 4.5 05/25/2014 0844   VLDL 34  05/25/2014 0844   LDLCALC 80 05/25/2014 0844     Wt Readings from Last 3 Encounters:  05/08/16 (!) 319 lb 14.2 oz (145.1 kg)  06/01/15 (!) 327 lb (148.3 kg)  05/10/15 (!) 327 lb 11.2 oz (148.6 kg)     Other studies Reviewed: Additional studies/ records that were reviewed today include: ***. Review of the above records demonstrates: ***  Assessment and Plan:   1. Severe aortic stenosis: He is now one month post TAVR. He is doing well. He is back on Eliquis. No bleeding issues. He is NYHA class 2***. Echo with ***  Current medicines are reviewed at length with the patient today.  The  patient {ACTIONS; HAS/DOES NOT HAVE:19233} concerns regarding medicines.  The following changes have been made:  {PLAN; NO CHANGE:13088:s}  Labs/ tests ordered today include: *** No orders of the defined types were placed in this encounter.   Disposition:   FU with *** in {gen number 1-61:096045} {TIME; UNITS DAY/WEEK/MONTH:19136}  Signed, Verne Carrow, MD 06/06/2016 6:37 AM    [facility] [address] [place], [place], [place]  [address] Phone: [phone]; Fax: [phone]

## 2016-06-11 ENCOUNTER — Other Ambulatory Visit: Payer: Self-pay

## 2016-06-11 ENCOUNTER — Ambulatory Visit (HOSPITAL_COMMUNITY): Payer: Medicare Other | Attending: Cardiology

## 2016-06-11 DIAGNOSIS — I517 Cardiomegaly: Secondary | ICD-10-CM | POA: Insufficient documentation

## 2016-06-11 DIAGNOSIS — Z952 Presence of prosthetic heart valve: Secondary | ICD-10-CM | POA: Insufficient documentation

## 2016-06-11 DIAGNOSIS — Z6841 Body Mass Index (BMI) 40.0 and over, adult: Secondary | ICD-10-CM | POA: Insufficient documentation

## 2016-06-11 DIAGNOSIS — I350 Nonrheumatic aortic (valve) stenosis: Secondary | ICD-10-CM

## 2016-06-13 ENCOUNTER — Telehealth (HOSPITAL_COMMUNITY): Payer: Self-pay | Admitting: Physician Assistant

## 2016-06-13 NOTE — Telephone Encounter (Signed)
Patient has [employer] and insurance benefits verified. [employer] medicare: $20.00 co-payment, no deductible, out of pocket max $6700/$282.00 has been met, 0% co-insurance, no pre-authorization and no limits on visit. Passport/reference # [id number]. Information left for Carlette to review.

## 2016-06-15 NOTE — Progress Notes (Signed)
Chief Complaint  Patient presents with  . Fatigue    History of Present Illness: 71 yo male with severe AS now s/p TAVR, PAF, HTN, HLD and prior CVA who is here for his one month TAVR follow up. He has missed two prior appointments for follow up since his procedure. He had been admitted to [facility] January 2018 with syncope and was found to have severe AS. He underwent TAVR with a 23 mm Edwards Sapien 3 bioprosthetic valve on 05/06/16 from the right transfemoral approach.   He tells me today that he is feeling well. No chest pain, dyspnea, near syncope, syncope, LE edema.   Primary Care Physician: [name], PA-C  Primary Cardiology: Eden Emms   Past Medical History:  Diagnosis Date  . Atrial fibrillation (HCC)    pt on Eliquis  . Chest pain   . CHF (congestive heart failure) (HCC)   . Edema   . HTN (hypertension)   . Hyperlipidemia   . Neuropathy (HCC)   . Obesity   . SOB (shortness of breath)   . Stroke [facility])     Past Surgical History:  Procedure Laterality Date  . BACK SURGERY    . CARDIAC CATHETERIZATION    . CARDIAC CATHETERIZATION N/A 04/30/2016   Procedure: Right/Left Heart Cath and Coronary Angiography;  Surgeon: Kathleene Hazel, MD;  Location: [facility] INVASIVE CV LAB;  Service: Cardiovascular;  Laterality: N/A;  . CAROTID ANGIOGRAM N/A 05/30/2014   Procedure: Dorise Bullion;  Surgeon: Nada Libman, MD;  Location: [facility] CATH LAB;  Service: Cardiovascular;  Laterality: N/A;  . MULTIPLE EXTRACTIONS WITH ALVEOLOPLASTY N/A 05/02/2016   Procedure: Extraction of tooth #'s 7, 10, 23, 24, 25,and 26 with alveoloplasty.;  Surgeon: Charlynne Pander, DDS;  Location: [facility] OR;  Service: Oral Surgery;  Laterality: N/A;  . TEE WITHOUT CARDIOVERSION N/A 05/06/2016   Procedure: TRANSESOPHAGEAL ECHOCARDIOGRAM (TEE);  Surgeon: Kathleene Hazel, MD;  Location: [facility] OR;  Service: Open Heart Surgery;  Laterality: N/A;  . TRANSCATHETER AORTIC VALVE REPLACEMENT, TRANSFEMORAL N/A 05/06/2016    Procedure: TRANSCATHETER AORTIC VALVE REPLACEMENT, TRANSFEMORAL using a 23mm Edwards Sapien 3 Transcatheter Heart Valve;  Surgeon: Kathleene Hazel, MD;  Location: [facility] OR;  Service: Open Heart Surgery;  Laterality: N/A;    Current Outpatient Prescriptions  Medication Sig Dispense Refill  . ALPRAZolam (XANAX) 0.5 MG tablet Take 0.5 mg by mouth at bedtime.     . Amino Acids-Protein Hydrolys (FEEDING SUPPLEMENT, PRO-STAT SUGAR FREE 64,) LIQD Take 30 mLs by mouth 3 (three) times daily between meals. 900 mL 0  . amiodarone (PACERONE) 200 MG tablet Take 1 tablet (200 mg total) by mouth daily. 60 tablet 0  . apixaban (ELIQUIS) 5 MG TABS tablet Take 1 tablet (5 mg total) by mouth 2 (two) times daily. 60 tablet 0  . atorvastatin (LIPITOR) 40 MG tablet Take 1 tablet (40 mg total) by mouth daily at 6 PM. 30 tablet 0  . buPROPion (WELLBUTRIN SR) 150 MG 12 hr tablet Take 450 mg by mouth daily.     . feeding supplement, ENSURE ENLIVE, (ENSURE ENLIVE) LIQD Take 237 mLs by mouth daily. 237 mL 12  . furosemide (LASIX) 20 MG tablet Take 1 tablet (20 mg total) by mouth daily as needed for fluid or edema (weight gain of 3 Lbs in 1 day). 30 tablet 0  . gabapentin (NEURONTIN) 800 MG tablet Take 800 mg by mouth 3 (three) times daily.     . hydrOXYzine (ATARAX/VISTARIL) 25  MG tablet Take 25 mg by mouth 3 (three) times daily as needed.    . metoprolol tartrate (LOPRESSOR) 25 MG tablet Take 1 tablet (25 mg total) by mouth 2 (two) times daily. 60 tablet 0  . oxycodone (OXY-IR) 5 MG capsule Take 5 mg by mouth every 4 (four) hours as needed for pain.     . pantoprazole (PROTONIX) 40 MG tablet Take 1 tablet (40 mg total) by mouth daily. 30 tablet 0  . polyethylene glycol (MIRALAX / GLYCOLAX) packet Take 17 g by mouth daily. 14 each 0  . sertraline (ZOLOFT) 100 MG tablet Take 150 mg by mouth daily.     . traMADol (ULTRAM) 50 MG tablet Take 50 mg by mouth every 6 (six) hours as needed for moderate pain.      No  current facility-administered medications for this visit.     Allergies  Allergen Reactions  . Coumadin [Warfarin Sodium] Rash    Social History   Social History  . Marital status: Divorced    Spouse name: N/A  . Number of children: N/A  . Years of education: N/A   Occupational History  . Not on file.   Social History Main Topics  . Smoking status: Former Games developer  . Smokeless tobacco: Former Neurosurgeon    Quit date: 03/22/1996  . Alcohol use No  . Drug use: No  . Sexual activity: Not on file   Other Topics Concern  . Not on file   Social History Narrative  . No narrative on file    Family History  Problem Relation Age of Onset  . Heart attack Mother   . Stroke Father     Review of Systems:  As stated in the HPI and otherwise negative.   BP (!) 110/58   Pulse 63   Ht 6\' 2"  (1.88 m)   Wt (!) 327 lb (148.3 kg)   SpO2 96%   BMI 41.98 kg/m   Physical Examination: General: Well developed, well nourished, NAD  HEENT: OP clear, mucus membranes moist  SKIN: warm, dry. No rashes. Neuro: No focal deficits  Musculoskeletal: Muscle strength 5/5 all ext  Psychiatric: Mood and affect normal  Neck: No JVD, no carotid bruits, no thyromegaly, no lymphadenopathy.  Lungs:Clear bilaterally, no wheezes, rhonci, crackles Cardiovascular: Regular rate and rhythm. Systolic murmur. No gallops or rubs. Abdomen:Soft. Bowel sounds present. Non-tender.  Extremities: No lower extremity edema. Pulses are 2 + in the bilateral DP/PT.  Echo 06/11/16: Left ventricle: The cavity size was normal. There was severe   concentric hypertrophy. Systolic function was normal. The   estimated ejection fraction was in the range of 60% to 65%. Wall   motion was normal; there were no regional wall motion   abnormalities. Doppler parameters are consistent with abnormal   left ventricular relaxation (grade 1 diastolic dysfunction).   Doppler parameters are consistent with elevated ventricular    end-diastolic filling pressure. - Aortic valve: Transvalvular velocity was within the normal range.   There was no stenosis. There was no regurgitation. Mean gradient   (S): 16 mm Hg. Peak gradient (S): 29 mm Hg. - Aortic root: The aortic root was normal in size. - Mitral valve: There was no regurgitation. - Left atrium: The atrium was mildly dilated. - Pulmonic valve: There was no regurgitation. - Pulmonary arteries: Systolic pressure was within the normal   range. - Inferior vena cava: The vessel was normal in size. - Pericardium, extracardiac: There was no pericardial effusion.  Impressions:  -  There is no change since the prior study on 05/07/2016.   Transaortic gradients remain in th enormal range. There is no   central aortic regurgitation or paravalvular leak.  EKG:  EKG is not ordered today. The ekg ordered today demonstrates   Recent Labs: 04/28/2016: ALT 17 04/29/2016: B Natriuretic Peptide 54.4 05/07/2016: Magnesium 2.8 05/08/2016: BUN 21; Creatinine, Ser 0.91; Hemoglobin 13.6; Platelets 191; Potassium 4.6; Sodium 138   Lipid Panel    Component Value Date/Time   CHOL 147 05/25/2014 0844   TRIG 169 (H) 05/25/2014 0844   HDL 33 (L) 05/25/2014 0844   CHOLHDL 4.5 05/25/2014 0844   VLDL 34 05/25/2014 0844   LDLCALC 80 05/25/2014 0844     Wt Readings from Last 3 Encounters:  06/16/16 (!) 327 lb (148.3 kg)  05/08/16 (!) 319 lb 14.2 oz (145.1 kg)  06/01/15 (!) 327 lb (148.3 kg)     Other studies Reviewed: Additional studies/ records that were reviewed today include: . Review of the above records demonstrates:   Assessment and Plan:   1. Severe aortic stenosis: He is now one month s/p TAVR. He is doing well. He is NYHA class 2, mostly due to his morbid obesity and deconditioning. Echo last week (one month post op) shows normally functioning bioprosthetic aortic valve with expected gradients across the valve. Will continue Eliquis (he is on this for atrial fib). Follow  up echo and follow up with me in one year. He will see his primary cardiologist Dr. Eden EmmsNishan in 6 months.   Current medicines are reviewed at length with the patient today.  The patient does not have concerns regarding medicines.  The following changes have been made:  no change  Labs/ tests ordered today include:  No orders of the defined types were placed in this encounter.    Disposition:   F/U Nishan in 6 months. FU with me in 12 months for echo and 1 year TAVR.    Signed, Verne Carrowhristopher Rendell Thivierge, MD 06/16/2016 11:07 AM    [facility] [address], [place], [place]  [address] Phone: [phone]; Fax: [phone]

## 2016-06-16 ENCOUNTER — Ambulatory Visit (INDEPENDENT_AMBULATORY_CARE_PROVIDER_SITE_OTHER): Payer: Medicare Other | Admitting: Cardiovascular Disease

## 2016-06-16 ENCOUNTER — Encounter: Payer: Self-pay | Admitting: Cardiovascular Disease

## 2016-06-16 ENCOUNTER — Encounter (INDEPENDENT_AMBULATORY_CARE_PROVIDER_SITE_OTHER): Payer: Self-pay

## 2016-06-16 VITALS — BP 110/58 | HR 63 | Ht 74.0 in | Wt 327.0 lb

## 2016-06-16 DIAGNOSIS — I350 Nonrheumatic aortic (valve) stenosis: Secondary | ICD-10-CM

## 2016-06-16 NOTE — Patient Instructions (Signed)
Medication Instructions:  Your physician recommends that you continue on your current medications as directed. Please refer to the Current Medication list given to you today.   Labwork: none  Testing/Procedures: Your physician has requested that you have an echocardiogram. Echocardiography is a painless test that uses sound waves to create images of your heart. It provides your doctor with information about the size and shape of your heart and how well your heart's chambers and valves are working. This procedure takes approximately one hour. There are no restrictions for this procedure. To be done in 11 months on day of appointment with Dr. Clifton JamesMcAlhany  Follow-Up: Your physician wants you to follow-up in: 6 months with Dr. Eden EmmsNishan and 11 months with Dr. Lisette GrinderMcAlhany You will receive a reminder letter in the mail two months in advance. If you don't receive a letter, please call our office to schedule the follow-up appointment.   Any Other Special Instructions Will Be Listed Below (If Applicable).     If you need a refill on your cardiac medications before your next appointment, please call your pharmacy.

## 2016-06-23 ENCOUNTER — Other Ambulatory Visit (HOSPITAL_COMMUNITY): Payer: Medicare Other

## 2016-06-24 ENCOUNTER — Telehealth (HOSPITAL_COMMUNITY): Payer: Self-pay | Admitting: Physician Assistant

## 2016-06-24 NOTE — Telephone Encounter (Signed)
I called and spoke to patient about participating and scheduling for the Cardiac Rehab program. Patient declined due to financial - $20.00 co-payment and gas to come back and forth. Referral canceled.

## 2016-07-04 ENCOUNTER — Ambulatory Visit: Payer: Medicare Other | Admitting: Cardiovascular Disease

## 2016-07-18 ENCOUNTER — Other Ambulatory Visit: Payer: Self-pay | Admitting: *Deleted

## 2016-07-18 MED ORDER — AMIODARONE HCL 200 MG PO TABS: 200.0000 mg | ORAL_TABLET | Freq: Every day | ORAL | 10 refills | Status: DC

## 2016-07-18 NOTE — Telephone Encounter (Signed)
He should continue this. OK to refill

## 2016-07-18 NOTE — Telephone Encounter (Signed)
Patient would like to know if he should continue the amiodarone. If he is supposed to he would like for Dr Clifton James to refill. Please advise. Thanks, MI

## 2016-07-30 ENCOUNTER — Ambulatory Visit: Payer: Medicare Other | Admitting: Cardiovascular Disease

## 2016-09-06 NOTE — Addendum Note (Signed)
Addendum  created 09/06/16 0842 by Edia Pursifull, MD   Sign clinical note    

## 2017-01-13 ENCOUNTER — Ambulatory Visit: Payer: Self-pay | Admitting: Interventional Cardiology

## 2017-01-13 NOTE — Progress Notes (Signed)
Patient ID: HUBER MATHERS, male   DOB: 04-16-1945, 71 y.o.   MRN: [id number] 71 y.o. initially  referred by Dr Nathanial Rancher 2013  First seen by Dr Deborah Chalk in 2001 for chest pain Cath with no CAD.  Seen by Dr Ladona Ridgel and Jens Som in 2004/2005.  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  Seen by Dr Tenny Craw in 2008.  Dobutamine myovue nonischemic.  Echo EF 65% with mean gradient 22 and peak .  Has had increasing dyspnea and fatigue.  Chronic LE edema on lasix.  Smokes 3-4ppd until 44 and has clinical COPD. On CPAP with significant obesity   Poor diet.    Also previous heavy ETOH.    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   Echo 06/11/16 EF normal 60-65% mean gradient 16 mmHg peak 29 mmHg no perivalvular regurgitation Cath prior to TAVR with only 40% proximal circumflex disease   Still with some LE edema and poor exercise tolerance Mentioned cardiac rehab to him and he  Prefers to have it done at [facility].   ROS: Denies fever, malais, weight loss, blurry vision, decreased visual acuity, cough, sputum, SOB, hemoptysis, pleuritic pain, palpitaitons, heartburn, abdominal pain, melena, lower extremity edema, claudication, or rash.  All other systems reviewed and negative   General: Affect appropriate Obese chroncially ill male HEENT: normal Neck supple with no adenopathy JVP normal left bruit with  CEA no thyromegaly Lungs clear with no whbruitseezing and good diaphragmatic motion Heart:  S1/S2 distant SEM through Sapien valve no ,rub, gallop or click PMI not palpable Abdomen: benighn, BS positve, no tenderness, no AAA no bruit.  No HSM or HJR Distal pulses intact with no bruits Plus 2 bilateral  edema Neuro non-focal Skin warm and dry No muscular weakness  Medications Current Outpatient Prescriptions  Medication Sig Dispense Refill  . amiodarone (PACERONE) 200  MG tablet Take 1 tablet (200 mg total) by mouth daily. 30 tablet 10  . atorvastatin (LIPITOR) 40 MG tablet Take 1 tablet (40 mg total) by mouth daily at 6 PM. 30 tablet 0  . buPROPion (WELLBUTRIN SR) 150 MG 12 hr tablet Take 450 mg by mouth daily.     . feeding supplement, ENSURE ENLIVE, (ENSURE ENLIVE) LIQD Take 237 mLs by mouth daily. 237 mL 12  . furosemide (LASIX) 80 MG tablet Take 80 mg by mouth 2 (two) times daily. PATIENT TAKES 1 AND A HALF TABLETS BY MOUTH TWICE DAILY    . gabapentin (NEURONTIN) 400 MG capsule Take 400 mg by mouth 3 (three) times daily.    Marland Kitchen levothyroxine (SYNTHROID, LEVOTHROID) 75 MCG tablet Take 75 mcg by mouth daily before breakfast.    . metoprolol tartrate (LOPRESSOR) 25 MG tablet Take 12.5 mg by mouth 2 (two) times daily.    . polyethylene glycol (MIRALAX / GLYCOLAX) packet Take 17 g by mouth daily. 14 each 0  . rivaroxaban (XARELTO) 20 MG TABS tablet Take 20 mg by mouth daily with supper.    Marland Kitchen spironolactone (ALDACTONE) 50 MG tablet Take 50 mg by mouth daily.    . traMADol (ULTRAM) 50 MG tablet Take 50 mg by mouth every 6 (six) hours as needed for moderate pain.     . traZODone (DESYREL) 100 MG tablet Take 100 mg by mouth at bedtime.     No current facility-administered medications for this visit.  Allergies Coumadin [warfarin sodium]  Family History: Family History  Problem Relation Age of Onset  . Heart attack Mother   . Stroke Father     Social History: Social History   Social History  . Marital status: Divorced    Spouse name: N/A  . Number of children: N/A  . Years of education: N/A   Occupational History  . Not on file.   Social History Main Topics  . Smoking status: Former Games developer  . Smokeless tobacco: Former Neurosurgeon    Quit date: 03/22/1996  . Alcohol use No  . Drug use: No  . Sexual activity: Not on file   Other Topics Concern  . Not on file   Social History Narrative  . 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  No TIA symptoms.  Continue antiplatelet Rx and F/U carotid duplex with Dr Darrick Penna Cholesterol  Cholesterol is at goal.  Continue current dose of statin and diet Rx.  No myalgias or side effects.  F/U  LFT's in 6 months. Lab Results  Component Value Date   LDLCALC 80 05/25/2014           TAVR:  Gradients a bit high but no perivalvular leak f/u CM 6 months if gradients continue to rise will need cardiac CTA But already on anticoagulation   Will check lab work today to include TSH,BNP, BMET and CBC  F/u with CM 6 months and me in a year   [employer]

## 2017-01-14 ENCOUNTER — Ambulatory Visit (INDEPENDENT_AMBULATORY_CARE_PROVIDER_SITE_OTHER): Payer: Medicare Other | Admitting: Cardiovascular Disease

## 2017-01-14 ENCOUNTER — Encounter: Payer: Self-pay | Admitting: Cardiovascular Disease

## 2017-01-14 VITALS — BP 120/78 | HR 68 | Ht 74.0 in | Wt 328.0 lb

## 2017-01-14 DIAGNOSIS — Z953 Presence of xenogenic heart valve: Secondary | ICD-10-CM | POA: Diagnosis not present

## 2017-01-14 DIAGNOSIS — R0602 Shortness of breath: Secondary | ICD-10-CM | POA: Diagnosis not present

## 2017-01-14 NOTE — Patient Instructions (Signed)
Medication Instructions:  Your physician recommends that you continue on your current medications as directed. Please refer to the Current Medication list given to you today.  Labwork: Your physician recommends that you have lab work today- BMET, BNP, CBC, TSH  Testing/Procedures: NONE  Follow-Up: Your physician wants you to follow-up in: 12 months with Dr. Haywood Filler will receive a reminder letter in the mail two months in advance. If you don't receive a letter, please call our office to schedule the follow-up appointment.  Your physician wants you to follow-up in: 6 months with Dr. Clifton James. You will receive a reminder letter in the mail two months in advance. If you don't receive a letter, please call our office to schedule the follow-up appointment.  You have been referred to Cardiac Rehab.   If you need a refill on your cardiac medications before your next appointment, please call your pharmacy.

## 2017-01-15 LAB — CBC WITH DIFFERENTIAL/PLATELET
BASOS ABS: 0 10*3/uL (ref 0.0–0.2)
Basos: 0 %
EOS (ABSOLUTE): 0.2 10*3/uL (ref 0.0–0.4)
Eos: 2 %
Hematocrit: 42.3 % (ref 37.5–51.0)
Hemoglobin: 14.2 g/dL (ref 13.0–17.7)
IMMATURE GRANULOCYTES: 0 %
Immature Grans (Abs): 0 10*3/uL (ref 0.0–0.1)
LYMPHS ABS: 1.2 10*3/uL (ref 0.7–3.1)
Lymphs: 18 %
MCH: 31.3 pg (ref 26.6–33.0)
MCHC: 33.6 g/dL (ref 31.5–35.7)
MCV: 93 fL (ref 79–97)
MONOCYTES: 9 %
MONOS ABS: 0.6 10*3/uL (ref 0.1–0.9)
NEUTROS PCT: 71 %
Neutrophils Absolute: 4.8 10*3/uL (ref 1.4–7.0)
Platelets: 242 10*3/uL (ref 150–379)
RBC: 4.53 x10E6/uL (ref 4.14–5.80)
RDW: 13.6 % (ref 12.3–15.4)
WBC: 6.9 10*3/uL (ref 3.4–10.8)

## 2017-01-15 LAB — PRO B NATRIURETIC PEPTIDE: NT-Pro BNP: 79 pg/mL (ref 0–376)

## 2017-01-15 LAB — BASIC METABOLIC PANEL
BUN / CREAT RATIO: 13 (ref 10–24)
BUN: 18 mg/dL (ref 8–27)
CO2: 30 mmol/L — ABNORMAL HIGH (ref 20–29)
Calcium: 8.6 mg/dL (ref 8.6–10.2)
Chloride: 97 mmol/L (ref 96–106)
Creatinine, Ser: 1.36 mg/dL — ABNORMAL HIGH (ref 0.76–1.27)
GFR, EST AFRICAN AMERICAN: 60 mL/min/{1.73_m2} (ref >59)
GFR, EST NON AFRICAN AMERICAN: 52 mL/min/{1.73_m2} — AB (ref >59)
Glucose: 169 mg/dL — ABNORMAL HIGH (ref 65–99)
POTASSIUM: 3.6 mmol/L (ref 3.5–5.2)
SODIUM: 142 mmol/L (ref 134–144)

## 2017-01-15 LAB — TSH: TSH: 6.15 u[IU]/mL — ABNORMAL HIGH (ref 0.450–4.500)

## 2017-01-19 ENCOUNTER — Telehealth (HOSPITAL_COMMUNITY): Payer: Self-pay

## 2017-01-19 NOTE — Telephone Encounter (Signed)
Verified insurance. $20 co-payment, no deductible, out of pocket amount is $6,700/$2,534.26 has been met, no co-insurance, and no pre-authorization is required. Passport/reference # [id number]

## 2017-01-19 NOTE — Telephone Encounter (Signed)
Called to discuss Cardiac Rehab with pt - Left voicemail to call back.

## 2017-01-28 NOTE — Telephone Encounter (Signed)
Attempted to contact patient in reference to Cardiac Rehab. Left voicemail on telephone.

## 2017-02-04 NOTE — Telephone Encounter (Signed)
Patient returned phone call in regards to Cardiac Rehab - Patient declined service due to the location. Closed referral.

## 2017-02-04 NOTE — Telephone Encounter (Signed)
3rd attempt to call patient in regards to Cardiac Rehab - Will send letter.

## 2017-03-01 ENCOUNTER — Emergency Department (HOSPITAL_COMMUNITY): Payer: Medicare Other

## 2017-03-01 ENCOUNTER — Encounter (HOSPITAL_COMMUNITY): Payer: Self-pay | Admitting: Emergency Medicine

## 2017-03-01 ENCOUNTER — Emergency Department (HOSPITAL_COMMUNITY)
Admission: EM | Admit: 2017-03-01 | Discharge: 2017-03-01 | Disposition: A | Discharge status: Home / Self Care | Payer: Medicare Other | Attending: Emergency Medicine | Admitting: Emergency Medicine

## 2017-03-01 DIAGNOSIS — Z7901 Long term (current) use of anticoagulants: Secondary | ICD-10-CM | POA: Diagnosis not present

## 2017-03-01 DIAGNOSIS — Z79899 Other long term (current) drug therapy: Secondary | ICD-10-CM | POA: Insufficient documentation

## 2017-03-01 DIAGNOSIS — Z87891 Personal history of nicotine dependence: Secondary | ICD-10-CM | POA: Insufficient documentation

## 2017-03-01 DIAGNOSIS — K219 Gastro-esophageal reflux disease without esophagitis: Secondary | ICD-10-CM | POA: Insufficient documentation

## 2017-03-01 DIAGNOSIS — I2510 Atherosclerotic heart disease of native coronary artery without angina pectoris: Secondary | ICD-10-CM | POA: Diagnosis not present

## 2017-03-01 DIAGNOSIS — I110 Hypertensive heart disease with heart failure: Secondary | ICD-10-CM | POA: Insufficient documentation

## 2017-03-01 DIAGNOSIS — R0600 Dyspnea, unspecified: Secondary | ICD-10-CM

## 2017-03-01 DIAGNOSIS — I509 Heart failure, unspecified: Secondary | ICD-10-CM | POA: Insufficient documentation

## 2017-03-01 DIAGNOSIS — R0602 Shortness of breath: Secondary | ICD-10-CM | POA: Diagnosis present

## 2017-03-01 DIAGNOSIS — Z8673 Personal history of transient ischemic attack (TIA), and cerebral infarction without residual deficits: Secondary | ICD-10-CM | POA: Diagnosis not present

## 2017-03-01 DIAGNOSIS — Z8679 Personal history of other diseases of the circulatory system: Secondary | ICD-10-CM | POA: Diagnosis not present

## 2017-03-01 LAB — CBC WITH DIFFERENTIAL/PLATELET
BASOS ABS: 0 10*3/uL (ref 0.0–0.1)
BASOS PCT: 0 %
Eosinophils Absolute: 0.2 10*3/uL (ref 0.0–0.7)
Eosinophils Relative: 2 %
HEMATOCRIT: 41.5 % (ref 39.0–52.0)
HEMOGLOBIN: 14 g/dL (ref 13.0–17.0)
Lymphocytes Relative: 22 %
Lymphs Abs: 1.9 10*3/uL (ref 0.7–4.0)
MCH: 32.1 pg (ref 26.0–34.0)
MCHC: 33.7 g/dL (ref 30.0–36.0)
MCV: 95.2 fL (ref 78.0–100.0)
MONOS PCT: 12 %
Monocytes Absolute: 1 10*3/uL (ref 0.1–1.0)
NEUTROS ABS: 5.5 10*3/uL (ref 1.7–7.7)
NEUTROS PCT: 64 %
Platelets: 225 10*3/uL (ref 150–400)
RBC: 4.36 MIL/uL (ref 4.22–5.81)
RDW: 14.4 % (ref 11.5–15.5)
WBC: 8.6 10*3/uL (ref 4.0–10.5)

## 2017-03-01 LAB — I-STAT TROPONIN, ED: TROPONIN I, POC: 0 ng/mL (ref 0.00–0.08)

## 2017-03-01 LAB — BASIC METABOLIC PANEL
ANION GAP: 9 (ref 5–15)
BUN: 22 mg/dL — ABNORMAL HIGH (ref 6–20)
CALCIUM: 9 mg/dL (ref 8.9–10.3)
CO2: 25 mmol/L (ref 22–32)
Chloride: 101 mmol/L (ref 101–111)
Creatinine, Ser: 1.3 mg/dL — ABNORMAL HIGH (ref 0.61–1.24)
GFR, EST NON AFRICAN AMERICAN: 54 mL/min — AB (ref >60)
Glucose, Bld: 113 mg/dL — ABNORMAL HIGH (ref 65–99)
POTASSIUM: 4.2 mmol/L (ref 3.5–5.1)
Sodium: 135 mmol/L (ref 135–145)

## 2017-03-01 LAB — BRAIN NATRIURETIC PEPTIDE: B Natriuretic Peptide: 21.9 pg/mL (ref 0.0–100.0)

## 2017-03-01 MED ORDER — OMEPRAZOLE 20 MG PO CPDR: 20.0000 mg | DELAYED_RELEASE_CAPSULE | Freq: Two times a day (BID) | ORAL | 0 refills | Status: DC

## 2017-03-01 MED ORDER — IPRATROPIUM-ALBUTEROL 0.5-2.5 (3) MG/3ML IN SOLN
3.0000 mL | Freq: Once | RESPIRATORY_TRACT | Status: AC
  Administered 2017-03-01: 3 mL via RESPIRATORY_TRACT
  Filled 2017-03-01: qty 3

## 2017-03-01 MED ORDER — POLYETHYLENE GLYCOL 3350 17 GM/SCOOP PO POWD: 17.0000 g | Freq: Every day | ORAL | 0 refills | Status: AC

## 2017-03-01 NOTE — Discharge Instructions (Signed)
Prilosec as prescribed.  Magnesium citrate: Drink the entire 10 ounce bottle mixed with equal parts Sprite or Gatorade for relief of constipation.  Follow-up with your primary doctor if you are not improving in the next week.

## 2017-03-01 NOTE — ED Notes (Signed)
Pt ambulated to restroom and back without assistance from staff. Used cane as per normal.  Pt did not get short of breath with ambulation.

## 2017-03-01 NOTE — ED Provider Notes (Signed)
[facility]ENC[facility] Provider Note   CSN: [id number] Arrival date & time: 03/01/17  0408     History   Chief Complaint Chief Complaint  Patient presents with  . Shortness of Breath    HPI Jorge [name] is a 71 y.o. male.  Patient is a 71 year old male with past medical history of CHF, hypertension, atrial fibrillation, and morbid obesity.  He presents for evaluation of dyspnea.  He woke from sleep this morning just prior to presentation feeling as though he could not breathe.  He reports feeling tight in his throat.  He then called 911 and was transported here.  His symptoms are improving.  He denies any chest pain, fevers, chills, or productive cough.   The history is provided by the patient.  Shortness of Breath  This is a new problem. The average episode lasts 1 hour. The problem occurs continuously.The problem has been gradually improving. Associated symptoms include leg swelling. Pertinent negatives include no fever, no cough, no sputum production and no chest pain. He has tried nothing for the symptoms.    Past Medical History:  Diagnosis Date  . Atrial fibrillation (HCC)    pt on Eliquis  . Chest pain   . CHF (congestive heart failure) (HCC)   . Edema   . HTN (hypertension)   . Hyperlipidemia   . Neuropathy   . Obesity   . SOB (shortness of breath)   . Stroke [facility](HCC)     Patient Active Problem List   Diagnosis Date Noted  . Fracture   . Severe aortic stenosis   . Hypoxemia   . Hypotension 04/29/2016  . NSTEMI (non-ST elevated myocardial infarction) (HCC)   . Syncope 04/28/2016  . Fall   . Ileus (HCC) 05/08/2015  . Abdominal pain 05/08/2015  . Nausea and vomiting 05/08/2015  . HTN (hypertension)   . Mixed hyperlipidemia   . CHF (congestive heart failure) (HCC)   . Cerebrovascular accident (CVA) due to stenosis of cerebral artery (HCC) 08/16/2014  . Paroxysmal atrial fibrillation (HCC) 05/29/2014  . Hemispheric carotid  artery syndrome   . Internal carotid artery stenosis   . CAD (coronary artery disease) 05/25/2014  . History of tobacco abuse 05/25/2014  . TIA (transient ischemic attack) 05/25/2014  . HLD (hyperlipidemia)   . Essential hypertension   . Stroke-like symptoms 05/24/2014  . Dyspnea 03/22/2012  . Aortic stenosis 03/22/2012  . Acute edema of lung, unspecified 03/22/2012  . History of CEA (carotid endarterectomy) 03/22/2012  . Chest pain 03/22/2012    Past Surgical History:  Procedure Laterality Date  . BACK SURGERY    . CARDIAC CATHETERIZATION    . CARDIAC CATHETERIZATION N/A 04/30/2016   Procedure: Right/Left Heart Cath and Coronary Angiography;  Surgeon: Kathleene Hazelhristopher D McAlhany, MD;  [facility];  Service: Cardiovascular;  Laterality: N/A;  . CAROTID ANGIOGRAM N/A 05/30/2014   Procedure: Dorise BullionERBRAL  ANGIOGRAM;  Surgeon: Nada LibmanVance W Brabham, MD;  [facility];  Service: Cardiovascular;  Laterality: N/A;  . MULTIPLE EXTRACTIONS WITH ALVEOLOPLASTY N/A 05/02/2016   Procedure: Extraction of tooth #'s 7, 10, 23, 24, 25,and 26 with alveoloplasty.;  Surgeon: Charlynne Panderonald F Kulinski, DDS;  Location: [facility] OR;  Service: Oral Surgery;  Laterality: N/A;  . TEE WITHOUT CARDIOVERSION N/A 05/06/2016   Procedure: TRANSESOPHAGEAL ECHOCARDIOGRAM (TEE);  Surgeon: Kathleene Hazelhristopher D McAlhany, MD;  [facility];  Service: Open Heart Surgery;  Laterality: N/A;  . TRANSCATHETER AORTIC VALVE REPLACEMENT, TRANSFEMORAL N/A 05/06/2016  Procedure: TRANSCATHETER AORTIC VALVE REPLACEMENT, TRANSFEMORAL using a 23mm Edwards Sapien 3 Transcatheter Heart Valve;  Surgeon: Kathleene Hazel, MD;  Location: [facility] OR;  Service: Open Heart Surgery;  Laterality: N/A;       Home Medications    Prior to Admission medications   Medication Sig Start Date End Date Taking? Authorizing Provider  amiodarone (PACERONE) 200 MG tablet Take 1 tablet (200 mg total) by mouth daily. 07/18/16   Kathleene Hazel, MD    atorvastatin (LIPITOR) 40 MG tablet Take 1 tablet (40 mg total) by mouth daily at 6 PM. 05/31/14   Lorenda Hatchet, MD  buPROPion [facility] SR) 150 MG 12 hr tablet Take 450 mg by mouth daily.     [provider]  feeding supplement, ENSURE ENLIVE, (ENSURE ENLIVE) LIQD Take 237 mLs by mouth daily. 05/09/16   Rolly Salter, MD  furosemide (LASIX) 80 MG tablet Take 80 mg by mouth 2 (two) times daily. PATIENT TAKES 1 AND A HALF TABLETS BY MOUTH TWICE DAILY    [provider]  gabapentin (NEURONTIN) 400 MG capsule Take 400 mg by mouth 3 (three) times daily.    [provider]  levothyroxine (SYNTHROID, LEVOTHROID) 75 MCG tablet Take 75 mcg by mouth daily before breakfast.    [provider]  metoprolol tartrate (LOPRESSOR) 25 MG tablet Take 12.5 mg by mouth 2 (two) times daily.    [provider]  polyethylene glycol (MIRALAX / GLYCOLAX) packet Take 17 g by mouth daily. 05/10/16   Rolly Salter, MD  rivaroxaban (XARELTO) 20 MG TABS tablet Take 20 mg by mouth daily with supper.    [provider]  spironolactone (ALDACTONE) 50 MG tablet Take 50 mg by mouth daily.    [provider]  traMADol (ULTRAM) 50 MG tablet Take 50 mg by mouth every 6 (six) hours as needed for moderate pain.     [provider]  traZODone (DESYREL) 100 MG tablet Take 100 mg by mouth at bedtime.    [provider]    Family History Family History  Problem Relation Age of Onset  . Heart attack Mother   . Stroke Father     Social History Social History   Tobacco Use  . Smoking status: Former Games developer  . Smokeless tobacco: Former Neurosurgeon    Quit date: 03/22/1996  Substance Use Topics  . Alcohol use: No  . Drug use: No     Allergies   Coumadin [warfarin sodium]   Review of Systems Review of Systems  Constitutional: Negative for fever.  Respiratory: Positive for shortness of breath. Negative for cough and sputum production.    Cardiovascular: Positive for leg swelling. Negative for chest pain.  All other systems reviewed and are negative.    Physical Exam Updated Vital Signs BP (!) 142/118   Pulse (!) 54   SpO2 97%   Physical Exam  Constitutional: He is oriented to person, place, and time. He appears well-developed and well-nourished. No distress.  HENT:  Head: Normocephalic and atraumatic.  Mouth/Throat: Oropharynx is clear and moist.  Neck: Normal range of motion. Neck supple.  Cardiovascular: Normal rate and regular rhythm. Exam reveals no friction rub.  No murmur heard. Pulmonary/Chest: Effort normal and breath sounds normal. No respiratory distress. He has no wheezes. He has no rales.  Abdominal: Soft. Bowel sounds are normal. He exhibits no distension. There is no tenderness.  Musculoskeletal: Normal range of motion. He exhibits no edema.  Neurological: He  is alert and oriented to person, place, and time. Coordination normal.  Skin: Skin is warm and dry. He is not diaphoretic.  Nursing note and vitals reviewed.    ED Treatments / Results  Labs (all labs ordered are listed, but only abnormal results are displayed) Labs Reviewed - No data to display  EKG  EKG Interpretation  Date/Time:  Sunday March 01 2017 04:21:51 EST Ventricular Rate:  56 PR Interval:    QRS Duration: 134 QT Interval:  459 QTC Calculation: 443 R Axis:   -44 Text Interpretation:  Sinus rhythm Borderline prolonged PR interval LVH with IVCD, LAD and secondary repol abnrm Confirmed by Geoffery LyonseLo, Delorean Knutzen ([id number]) on 03/01/2017 5:51:44 AM       Radiology No results found.  Procedures Procedures (including critical care time)  Medications Ordered in ED Medications - No data to display   Initial Impression / Assessment and Plan / ED Course  I have reviewed the triage vital signs and the nursing notes.  Pertinent labs & imaging results that were available during my care of the patient were reviewed by me and  considered in my medical decision making (see chart for details).  Patient presents with complaints of dyspnea.  This woke him from sleep this morning.  He reports a feeling of being choked and an acid taste in his mouth.  I highly suspect he experienced an episode of reflux in the night that caused this.  His chest x-ray is clear and workup was otherwise unremarkable.  I will prescribe omeprazole.  He also reports having not had a bowel movement in several days.  I will advise him try magnesium citrate and follow-up with primary doctor if not improving.  Final Clinical Impressions(s) / ED Diagnoses   Final diagnoses:  None    ED Discharge Orders    None       Geoffery Lyonselo, Kaydra Borgen, MD 03/01/17 [phone]

## 2017-03-01 NOTE — ED Triage Notes (Signed)
Per EMS pt from home.  Approximately 24 hours ago had onset of nasal congestion. Took 1 sudafed without relief.  Pt has shortness of breath on exertion. No O2 placed by EMS.  Pt sats 98% on room air.   Also complaints of constipation x 2 weeks.  VSS. 150/94, 64, 98%, RR16.   [id number]

## 2017-03-10 ENCOUNTER — Other Ambulatory Visit: Payer: Self-pay | Admitting: Physician Assistant

## 2017-03-10 ENCOUNTER — Telehealth: Payer: Self-pay

## 2017-03-10 DIAGNOSIS — Z952 Presence of prosthetic heart valve: Secondary | ICD-10-CM

## 2017-03-10 NOTE — Telephone Encounter (Signed)
Pt due to schedule 1 YEAR TAVR follow up office visit and echocardiogram.  I have left a message for the pt to contact me to arrange appointments.

## 2017-03-10 NOTE — Telephone Encounter (Signed)
Pt returned call and has been scheduled for appointments 04/15/2017.

## 2017-04-15 ENCOUNTER — Other Ambulatory Visit (HOSPITAL_COMMUNITY): Payer: Self-pay

## 2017-04-15 ENCOUNTER — Ambulatory Visit: Payer: Self-pay | Admitting: Physician Assistant

## 2017-04-27 ENCOUNTER — Telehealth: Payer: Self-pay

## 2017-04-27 NOTE — Telephone Encounter (Signed)
Pt cancelled 1 year TAVR follow-up that was scheduled on 04/15/2017.  I left the pt a message to contact me to get his Echo and OV rescheduled.  TAVR was performed 05/06/2016.

## 2017-04-27 NOTE — Telephone Encounter (Signed)
I spoke with the pt and scheduled 1 YEAR TAVR follow-up OV and Echo on 05/28/17.

## 2017-04-30 ENCOUNTER — Other Ambulatory Visit (HOSPITAL_COMMUNITY): Payer: Self-pay

## 2017-05-28 ENCOUNTER — Other Ambulatory Visit: Payer: Self-pay

## 2017-05-28 ENCOUNTER — Encounter: Payer: Self-pay | Admitting: Physician Assistant

## 2017-05-28 ENCOUNTER — Encounter: Payer: Self-pay | Admitting: Neurology

## 2017-05-28 ENCOUNTER — Ambulatory Visit (HOSPITAL_COMMUNITY): Payer: Medicare Other | Attending: Cardiology

## 2017-05-28 ENCOUNTER — Ambulatory Visit (INDEPENDENT_AMBULATORY_CARE_PROVIDER_SITE_OTHER): Payer: Medicare Other | Admitting: Physician Assistant

## 2017-05-28 VITALS — BP 110/76 | HR 64 | Ht 74.0 in | Wt 336.0 lb

## 2017-05-28 DIAGNOSIS — Z952 Presence of prosthetic heart valve: Secondary | ICD-10-CM

## 2017-05-28 DIAGNOSIS — Z953 Presence of xenogenic heart valve: Secondary | ICD-10-CM | POA: Insufficient documentation

## 2017-05-28 DIAGNOSIS — I4891 Unspecified atrial fibrillation: Secondary | ICD-10-CM | POA: Diagnosis not present

## 2017-05-28 DIAGNOSIS — I509 Heart failure, unspecified: Secondary | ICD-10-CM | POA: Diagnosis not present

## 2017-05-28 DIAGNOSIS — G629 Polyneuropathy, unspecified: Secondary | ICD-10-CM | POA: Diagnosis not present

## 2017-05-28 DIAGNOSIS — I110 Hypertensive heart disease with heart failure: Secondary | ICD-10-CM | POA: Insufficient documentation

## 2017-05-28 DIAGNOSIS — Z6841 Body Mass Index (BMI) 40.0 and over, adult: Secondary | ICD-10-CM | POA: Insufficient documentation

## 2017-05-28 DIAGNOSIS — Z8673 Personal history of transient ischemic attack (TIA), and cerebral infarction without residual deficits: Secondary | ICD-10-CM | POA: Insufficient documentation

## 2017-05-28 DIAGNOSIS — Z48812 Encounter for surgical aftercare following surgery on the circulatory system: Secondary | ICD-10-CM | POA: Diagnosis present

## 2017-05-28 DIAGNOSIS — E669 Obesity, unspecified: Secondary | ICD-10-CM | POA: Insufficient documentation

## 2017-05-28 DIAGNOSIS — E785 Hyperlipidemia, unspecified: Secondary | ICD-10-CM | POA: Diagnosis not present

## 2017-05-28 NOTE — Patient Instructions (Signed)
Medication Instructions:  Your physician recommends that you continue on your current medications as directed. Please refer to the Current Medication list given to you today.   Discussed the use of prophylactic antibiotics before dental work and other surgeries.  Call our office if a prescription for antibiotics is needed  Labwork: None Ordered   Testing/Procedures: None Ordered  Follow-Up: Your physician wants you to follow-up in: October 2019 with Dr. Eden EmmsNishan. You will receive a reminder letter in the mail two months in advance. If you don't receive a letter, please call our office to schedule the follow-up appointment.   If you need a refill on your cardiac medications before your next appointment, please call your pharmacy.   Thank you for choosing [facility]! Eligha BridegroomMichelle Swinyer, RN [phone]

## 2017-05-28 NOTE — Progress Notes (Signed)
[facility]   MULTIDISCIPLINARY HEART VALVE CLINIC                                       Cardiology Office Note    Date:  05/29/2017   ID:  Jorge [name], DOB [birth year], MRN [id number]  PCP:  [name], PA-C  Cardiologist:  Dr. Eden Emms / Dr. Clifton James & Dr. Laneta Simmers (TAVR)  CC: 1 year follow up s/p TAVR   History of Present Illness:  Jorge [name] is a 72 y.o. male with a history of PAF on Coumadin, HTN, morbid obesity, OSA on CPAP, carotid artery disease s/p CEA, previous CVA, COPD, severe AS s/p TAVR (04/2016) who presents to clinic for 1 year follow up.   He was admitted to [facility] 04/2016 with syncope and was found to have severe AS. He underwent TAVR with a 23 mm Edwards Sapien 3 bioprosthetic valve on 05/06/16 from the right transfemoral approach.   He saw Dr. Clifton James back for 1 month follow up and was doing well. He had NHYA class II symptoms, mostly due to morbid obesity and deconditioning. 1 month echo showed normally functioning bioprosthetic aortic valve with expected gradients across the valve; mean gradient 16 mm Hg. He was continued on asa 81 mg daily and Eliquis.   Today he presents to clinic for follow up. He is doing okay. He has ups and down. He occasionally gets CP. He gets a small twinge in the AM that usually resolves quickly. He has chronic dyspnea with exertion. No dyspnea at rest. He has chronic LE edema. His primary care PA-C recently increased his lasix from 80mg  BID to 120mg  BID, which has helped. He has three pillow orthopnea. He also has PND. He is currently being worked up for OSA and CPAP. He has occasional dizziness but no syncope. No blood in stool or urine. He has occasional palpitations. Overall, he does feel a lot better since having his TAVR. He is no longer on a DOAC and was swtiched to coumadin due to cost reasons. This is followed by [name] PA-C in [place].   Past Medical History:  Diagnosis Date  . Atrial fibrillation (HCC)      pt on Eliquis  . Chest pain   . CHF (congestive heart failure) (HCC)   . Depression   . Edema   . HTN (hypertension)   . Hyperlipidemia   . Hypothyroidism   . Neuropathy   . Obesity   . SOB (shortness of breath)   . Stroke [facility])     Past Surgical History:  Procedure Laterality Date  . BACK SURGERY    . CARDIAC CATHETERIZATION    . CARDIAC CATHETERIZATION N/A 04/30/2016   Procedure: Right/Left Heart Cath and Coronary Angiography;  Surgeon: Kathleene Hazel, MD;  Location: [facility] INVASIVE CV LAB;  Service: Cardiovascular;  Laterality: N/A;  . CAROTID ANGIOGRAM N/A 05/30/2014   Procedure: Dorise Bullion;  Surgeon: Nada Libman, MD;  Location: [facility] CATH LAB;  Service: Cardiovascular;  Laterality: N/A;  . MULTIPLE EXTRACTIONS WITH ALVEOLOPLASTY N/A 05/02/2016   Procedure: Extraction of tooth #'s 7, 10, 23, 24, 25,and 26 with alveoloplasty.;  Surgeon: Charlynne Pander, DDS;  Location: [facility] OR;  Service: Oral Surgery;  Laterality: N/A;  . TEE WITHOUT CARDIOVERSION N/A 05/06/2016   Procedure: TRANSESOPHAGEAL ECHOCARDIOGRAM (TEE);  Surgeon: Kathleene Hazel, MD;  Location[facility] OR;  Service: Open Heart Surgery;  Laterality: N/A;  . TRANSCATHETER AORTIC VALVE REPLACEMENT, TRANSFEMORAL N/A 05/06/2016   Procedure: TRANSCATHETER AORTIC VALVE REPLACEMENT, TRANSFEMORAL using a 23mm Edwards Sapien 3 Transcatheter Heart Valve;  Surgeon: Kathleene Hazel, MD;  Location: [facility] OR;  Service: Open Heart Surgery;  Laterality: N/A;    Current Medications: Outpatient Medications Prior to Visit  Medication Sig Dispense Refill  . acetaminophen (TYLENOL) 500 MG tablet Take 1,000 mg by mouth daily as needed.    Marland Kitchen amiodarone (PACERONE) 200 MG tablet Take 1 tablet (200 mg total) by mouth daily. 30 tablet 10  . atorvastatin (LIPITOR) 40 MG tablet Take 1 tablet (40 mg total) by mouth daily at 6 PM. 30 tablet 0  . buPROPion (WELLBUTRIN SR) 150 MG 12 hr tablet Take 450 mg by mouth daily.     . feeding  supplement, ENSURE ENLIVE, (ENSURE ENLIVE) LIQD Take 237 mLs by mouth daily. 237 mL 12  . furosemide (LASIX) 80 MG tablet Take 120 mg by mouth 2 (two) times daily. PATIENT TAKES 1 AND A HALF TABLETS BY MOUTH TWICE DAILY     . gabapentin (NEURONTIN) 400 MG capsule Take 400 mg by mouth 3 (three) times daily.    Marland Kitchen levothyroxine (SYNTHROID, LEVOTHROID) 75 MCG tablet Take 75 mcg by mouth daily before breakfast.    . metoprolol tartrate (LOPRESSOR) 25 MG tablet Take 12.5 mg by mouth 2 (two) times daily.    Marland Kitchen omeprazole (PRILOSEC) 20 MG capsule Take 1 capsule (20 mg total) by mouth 2 (two) times daily before a meal. 30 capsule 0  . polyethylene glycol powder (GLYCOLAX/MIRALAX) powder Take 17 g by mouth daily. 255 g 0  . spironolactone (ALDACTONE) 50 MG tablet Take 50 mg by mouth daily.    . traMADol (ULTRAM) 50 MG tablet Take 50 mg by mouth every 6 (six) hours as needed for moderate pain.     . traZODone (DESYREL) 100 MG tablet Take 100 mg by mouth at bedtime.    Marland Kitchen warfarin (COUMADIN) 5 MG tablet Take 5 mg by mouth daily. Take as directed by PCP    . rivaroxaban (XARELTO) 20 MG TABS tablet Take 20 mg by mouth daily with supper.     No facility-administered medications prior to visit.      Allergies:   Coumadin [warfarin sodium]   Social History   Socioeconomic History  . Marital status: Divorced    Spouse name: None  . Number of children: None  . Years of education: None  . Highest education level: None  Social Needs  . Financial resource strain: None  . Food insecurity - worry: None  . Food insecurity - inability: None  . Transportation needs - medical: None  . Transportation needs - non-medical: None  Occupational History  . None  Tobacco Use  . Smoking status: Former Games developer  . Smokeless tobacco: Former Neurosurgeon    Quit date: 03/22/1996  Substance and Sexual Activity  . Alcohol use: No  . Drug use: No  . Sexual activity: None  Other Topics Concern  . None  Social History Narrative    . None     Family History:  The patient's family history includes Heart attack in his mother; Stroke in his father.      ROS:   Please see the history of present illness.    ROS All other systems reviewed and are negative.   PHYSICAL EXAM:   VS:  BP 110/76 (BP Location: Right Arm, Patient Position:  Sitting, Cuff Size: Large)   Pulse 64   Ht 6\' 2"  (1.88 m)   Wt (!) 336 lb (152.4 kg)   SpO2 93%   BMI 43.14 kg/m    GEN: Well nourished, well developed, in no acute distress, morbidly obese  HEENT: normal  Neck: no JVD, carotid bruits, or masses Cardiac: RRR; very soft flow murmur. No rubs, or gallops. 1+ LE edema bilaterally.  Respiratory:  clear to auscultation bilaterally, normal work of breathing GI: soft, nontender, nondistended, + BS MS: no deformity or atrophy  Skin: warm and dry, no rash Neuro:  Alert and Oriented x 3, Strength and sensation are intact Psych: euthymic mood, full affect   Wt Readings from Last 3 Encounters:  05/28/17 (!) 336 lb (152.4 kg)  01/14/17 (!) 328 lb (148.8 kg)  06/16/16 (!) 327 lb (148.3 kg)      Studies/Labs Reviewed:   EKG:  EKG is NOT ordered today.    Recent Labs: 01/14/2017: NT-Pro BNP 79; TSH 6.150 03/01/2017: B Natriuretic Peptide 21.9; BUN 22; Creatinine, Ser 1.30; Hemoglobin 14.0; Platelets 225; Potassium 4.2; Sodium 135   Lipid Panel    Component Value Date/Time   CHOL 147 05/25/2014 0844   TRIG 169 (H) 05/25/2014 0844   HDL 33 (L) 05/25/2014 0844   CHOLHDL 4.5 05/25/2014 0844   VLDL 34 05/25/2014 0844   LDLCALC 80 05/25/2014 0844    Additional studies/ records that were reviewed today include:   Echo 06/11/16: Left ventricle: The cavity size was normal. There was severe concentric hypertrophy. Systolic function was normal. The estimated ejection fraction was in the range of 60% to 65%. Wall motion was normal; there were no regional wall motion abnormalities. Doppler parameters are consistent with  abnormal left ventricular relaxation (grade 1 diastolic dysfunction). Doppler parameters are consistent with elevated ventricular end-diastolic filling pressure. - Aortic valve: Transvalvular velocity was within the normal range. There was no stenosis. There was no regurgitation. Mean gradient (S): 16 mm Hg. Peak gradient (S): 29 mm Hg. - Aortic root: The aortic root was normal in size. - Mitral valve: There was no regurgitation. - Left atrium: The atrium was mildly dilated. - Pulmonic valve: There was no regurgitation. - Pulmonary arteries: Systolic pressure was within the normal range. - Inferior vena cava: The vessel was normal in size. - Pericardium, extracardiac: There was no pericardial effusion. Impressions: - There is no change since the prior study on 05/07/2016. Transaortic gradients remain in th enormal range. There is no central aortic regurgitation or paravalvular leak.   2D ECHO 05/28/17 (1 year s/p TAVR) Study Conclusions - Left ventricle: The cavity size was normal. Wall thickness was   increased in a pattern of moderate LVH. Systolic function was   normal. The estimated ejection fraction was in the range of 60%   to 65%. Wall motion was normal; there were no regional wall   motion abnormalities. Doppler parameters are consistent with   abnormal left ventricular relaxation (grade 1 diastolic   dysfunction). - Aortic valve: Bioprosthetic aortic valve s/p TAVR. The valve was   poorly visualized. Mean gradient (S): 17 mm Hg. - Mitral valve: There was no significant regurgitation. - Right ventricle: The cavity size was normal. Systolic function   was normal. - Tricuspid valve: Peak RV-RA gradient (S): 27 mm Hg. - Pulmonary arteries: PA peak pressure: 30 mm Hg (S). - Inferior vena cava: The vessel was normal in size. The   respirophasic diameter changes were in the normal range (>=  50%),   consistent with normal central venous pressure. Impressions: -  Normal LV size with moderate LV hypertrophy. EF 60-65%. Normal RV   size and systolic function. Bioprosthetic aortic valve s/p TAVR,   mean gradient 17 mmHg. Valve was poorly visualized.    ASSESSMENT & PLAN:   Severe AS s/p TAVR: he has NYHA class II-III symptoms, mostly related to morbid obesity and deconditioning. He says he is much improved since his TAVR surgery when he could barely function 2/2 dyspnea. 2D ECHO today shows normal LV function with a normally functioning TAVR valve (althouh poorly visualized) with no PVL and mean gradient 17 mmHg (which is elevated but stable from previous at 16 mm Hg).  He had a size 23 mm valve with a large BSA, so this is not surprising. He is already on oral anticoagulation with Coumadin for his afib. SBE prophylaxis was reviewed. He will continue regular follow up with Dr. Eden EmmsNishan.     Medication Adjustments/Labs and Tests Ordered: Current medicines are reviewed at length with the patient today.  Concerns regarding medicines are outlined above.  Medication changes, Labs and Tests ordered today are listed in the Patient Instructions below. Patient Instructions  Medication Instructions:  Your physician recommends that you continue on your current medications as directed. Please refer to the Current Medication list given to you today.   Discussed the use of prophylactic antibiotics before dental work and other surgeries.  Call our office if a prescription for antibiotics is needed  Labwork: None Ordered   Testing/Procedures: None Ordered  Follow-Up: Your physician wants you to follow-up in: October 2019 with Dr. Eden EmmsNishan. You will receive a reminder letter in the mail two months in advance. If you don't receive a letter, please call our office to schedule the follow-up appointment.   If you need a refill on your cardiac medications before your next appointment, please call your pharmacy.   Thank you for choosing [facility]! Eligha BridegroomMichelle  Swinyer, RN [phone]       Signed, Cline CrockKathryn Maryuri Warnke, PA-C  05/29/2017 7:27 AM    [facility] [address], [place], [place]  [address] Phone: [phone]; Fax: [phone]

## 2017-06-01 ENCOUNTER — Institutional Professional Consult (permissible substitution): Payer: Self-pay | Admitting: Neurology

## 2017-06-25 ENCOUNTER — Encounter: Payer: Self-pay | Admitting: Thoracic Surgery (Cardiothoracic Vascular Surgery)

## 2017-07-02 ENCOUNTER — Encounter: Payer: Self-pay | Admitting: Neurology

## 2017-07-02 ENCOUNTER — Ambulatory Visit: Payer: Medicare Other | Admitting: Neurology

## 2017-07-02 VITALS — BP 117/69 | HR 55 | Ht 74.0 in | Wt 335.0 lb

## 2017-07-02 DIAGNOSIS — R0902 Hypoxemia: Secondary | ICD-10-CM

## 2017-07-02 DIAGNOSIS — R0601 Orthopnea: Secondary | ICD-10-CM | POA: Diagnosis not present

## 2017-07-02 DIAGNOSIS — G4701 Insomnia due to medical condition: Secondary | ICD-10-CM

## 2017-07-02 DIAGNOSIS — G8929 Other chronic pain: Secondary | ICD-10-CM

## 2017-07-02 DIAGNOSIS — M25472 Effusion, left ankle: Secondary | ICD-10-CM

## 2017-07-02 DIAGNOSIS — M25471 Effusion, right ankle: Secondary | ICD-10-CM

## 2017-07-02 DIAGNOSIS — G4731 Primary central sleep apnea: Secondary | ICD-10-CM | POA: Diagnosis not present

## 2017-07-02 DIAGNOSIS — F1190 Opioid use, unspecified, uncomplicated: Secondary | ICD-10-CM | POA: Diagnosis not present

## 2017-07-02 DIAGNOSIS — I5042 Chronic combined systolic (congestive) and diastolic (congestive) heart failure: Secondary | ICD-10-CM

## 2017-07-02 NOTE — Progress Notes (Signed)
SLEEP MEDICINE CLINIC   Provider:  Melvyn Novas, M D  Primary Care Physician:  [name], PA-C   Referring Provider: [name], PA-C    Chief Complaint  Patient presents with  . New Patient (Initial Visit)    pt alone, rm 11. pt states that he wakes up several times where he feels he cant breathe, complains of leg pain. pt states that he gets about 2-3 hours of sleep. pt has daytime sleepiness. pt has been told he snores in sleep    HPI:  Jorge [name] is a 72 y.o. male , seen here  in a referral from Dr. Anna Genre as a new patient to the SLEEP CLINIC.  Mr. Foskey is audibly wheezing and short of breath , morbidly obese. He reports having difficulties sleeping. He has memory trouble since a stroke and had a seizure in 2000. He indicated that his sleep is very limited at night and that he only gets 2-3 hours each night with great difficulties. He sleeps sitting or on 4 pillows, is orthopnoeic.    He reports numbness weakness, nocturia, fatigue, restless legs and that he is known to snore and have apnea.  Carries a diagnosis of depression, heart disease, atrial fibrillation, hypertension morbid obesity, his last cardiac workup was on 01 March 2017 for shortness of breath, and was unremarkable.  He does have GERD which may also trigger some shortness of breath or nocturnal apnea.  He has chronic constipation, was diagnosed with sleep apnea probably 15 years ago or longer but has not used CPAP in many many years but she could not tolerate he states.  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  Hypothyroidism has been followed by primary care, his gait instability following his back surgery, chronic back pain and extremity pain has been followed by primary care( by [name] his primary care provider). 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   He was advised that there is no chronic back pain therapy or pain management in this practice.  Chief complaint according to patient : "I just can't sleep "  Sleep habits are as follows: The patient's bedtime is more or less around 10:30 PM.  Until then he watches TV, in his living room. Takes the dog for a walk.  Most nights he is asleep twin 1 and 2 AM, after a long delay.  By 3 AM he is up for a bathroom break, he may go back to sleep and wakes up an hour later again.  This goes on throughout the night. Sleep son 4 pillows.    It is pain that wakes him between 330 and 4 AM and usually he will leave the bed them knowing that he can no longer sleep any longer.  This is not a primary organic sleep disorder. He has less back pain I when sitting in a chair and  naps in the chair- 2-3 times a day. He gets 4 hours of sleep max at night , and additional 1.5 hours in daytime.    Medication reviewed.   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  I will address the obstructive sleep apnea that the patient had been diagnosed with in the past and see if CPAP can  help him to sleep longer or reduce the number of bathroom breaks.   Sleep medical history and family sleep history:  Last sleep study followed a stroke. HTN , CVA , Seizure, CHF, edema, neuropathy with edema. Back pain from failed back surgery. Only child , parents had no diagnosed sleep disorder, father snored and acted out dreams. .   Social history:  3 sons. Divorced, lives alone with a dag. Remote smoking history 0 quit 1979. ETOH abuse , quit 1975. Caffeine ;: rarely ice tea, coffee or soda.  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     Review of Systems: Out of a complete 14 system review, the patient complains of only the following symptoms, and all  other reviewed systems are negative.  He reports numbness weakness chronic back pain, swelling in his legs nocturia fatigue restless legs and that he is known to snore and have apnea.  Carries a diagnosis of depression, heart disease, atrial fibrillation, hypertension morbid obesity, he has been treated for chronic pain and fibromyalgia by [name] his primary care provider. He was advised that there is no chronic pain therapy or pain management in this practice. 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    Epworth score: 13/ 24   , Fatigue severity score :45  , depression score 9/ 15 points.    Social History   Socioeconomic History  . Marital status: Divorced    Spouse name: Not on file  . Number of children: Not on file  . Years of education: Not on file  . Highest education level: Not on file  Occupational History  . Not on file  Social Needs  . Financial resource strain: Not on file  . Food insecurity:    Worry: Not on file    Inability: Not on file  . Transportation needs:    Medical: Not on file    Non-medical: Not on file  Tobacco Use  . Smoking status: Former Games developer  . Smokeless tobacco: Former Neurosurgeon    Quit date: 03/22/1996  Substance and Sexual Activity  . Alcohol use: No  . Drug use: No  . Sexual activity: Not on file  Lifestyle  . Physical activity:    Days per week: Not on file    Minutes per session: Not on file  . Stress: Not on file  Relationships  . Social connections:    Talks on phone: Not on file    Gets together: Not on file    Attends religious service: Not on file    Active member of club or organization: Not on file    Attends meetings of clubs or organizations: Not on file    Relationship status: Not on file  . Intimate partner violence:    Fear of current or ex partner: Not on file    Emotionally abused: Not on file    Physically abused: Not on file    Forced sexual activity: Not on file  Other  Topics Concern  . Not on file  Social History Narrative  . Not on file    Family History  Problem Relation Age of Onset  . Heart attack Mother   . Stroke Father     Past Medical History:  Diagnosis Date  . Atrial fibrillation (HCC)    pt on Eliquis  . Chest pain   . CHF (congestive heart failure) (HCC)   . Depression   . Edema   .  HTN (hypertension)   . Hyperlipidemia   . Hypothyroidism   . Neuropathy   . Obesity   . SOB (shortness of breath)   . Stroke [facility](HCC)     Past Surgical History:  Procedure Laterality Date  . BACK SURGERY    . CARDIAC CATHETERIZATION    . CARDIAC CATHETERIZATION N/A 04/30/2016   Procedure: Right/Left Heart Cath and Coronary Angiography;  Surgeon: Kathleene Hazelhristopher D McAlhany, MD;  [facility];  Service: Cardiovascular;  Laterality: N/A;  . CAROTID ANGIOGRAM N/A 05/30/2014   Procedure: Dorise BullionERBRAL  ANGIOGRAM;  Surgeon: Nada LibmanVance W Brabham, MD;  [facility];  Service: Cardiovascular;  Laterality: N/A;  . MULTIPLE EXTRACTIONS WITH ALVEOLOPLASTY N/A 05/02/2016   Procedure: Extraction of tooth #'s 7, 10, 23, 24, 25,and 26 with alveoloplasty.;  Surgeon: Charlynne Panderonald F Kulinski, DDS;  Location: [facility] OR;  Service: Oral Surgery;  Laterality: N/A;  . TEE WITHOUT CARDIOVERSION N/A 05/06/2016   Procedure: TRANSESOPHAGEAL ECHOCARDIOGRAM (TEE);  Surgeon: Kathleene Hazelhristopher D McAlhany, MD;  [facility];  Service: Open Heart Surgery;  Laterality: N/A;  . TRANSCATHETER AORTIC VALVE REPLACEMENT, TRANSFEMORAL N/A 05/06/2016   Procedure: TRANSCATHETER AORTIC VALVE REPLACEMENT, TRANSFEMORAL using a 23mm Edwards Sapien 3 Transcatheter Heart Valve;  Surgeon: Kathleene Hazelhristopher D McAlhany, MD;  Location: [facility] OR;  Service: Open Heart Surgery;  Laterality: N/A;    Current Outpatient Medications  Medication Sig Dispense Refill  . acetaminophen (TYLENOL) 500 MG tablet Take 1,000 mg by mouth daily as needed.    Marland Kitchen. amiodarone (PACERONE) 200 MG tablet Take 1 tablet (200 mg total) by mouth  daily. 30 tablet 10  . atorvastatin (LIPITOR) 40 MG tablet Take 1 tablet (40 mg total) by mouth daily at 6 PM. 30 tablet 0  . buPROPion (WELLBUTRIN SR) 150 MG 12 hr tablet Take 450 mg by mouth daily.     . feeding supplement, ENSURE ENLIVE, (ENSURE ENLIVE) LIQD Take 237 mLs by mouth daily. 237 mL 12  . furosemide (LASIX) 80 MG tablet Take 120 mg by mouth 2 (two) times daily. PATIENT TAKES 1 AND A HALF TABLETS BY MOUTH TWICE DAILY     . gabapentin (NEURONTIN) 400 MG capsule Take 400 mg by mouth 3 (three) times daily.    Marland Kitchen. levothyroxine (SYNTHROID, LEVOTHROID) 100 MCG tablet     . metoprolol tartrate (LOPRESSOR) 25 MG tablet Take 12.5 mg by mouth 2 (two) times daily.    Marland Kitchen. omeprazole (PRILOSEC) 20 MG capsule Take 1 capsule (20 mg total) by mouth 2 (two) times daily before a meal. 30 capsule 0  . polyethylene glycol powder (GLYCOLAX/MIRALAX) powder Take 17 g by mouth daily. 255 g 0  . spironolactone (ALDACTONE) 50 MG tablet Take 50 mg by mouth daily.    . traMADol (ULTRAM) 50 MG tablet Take 50 mg by mouth every 6 (six) hours as needed for moderate pain.     . traZODone (DESYREL) 100 MG tablet Take 100 mg by mouth at bedtime.    Marland Kitchen. warfarin (COUMADIN) 5 MG tablet Take 5 mg by mouth daily. Take as directed by PCP     No current facility-administered medications for this visit.     Allergies as of 07/02/2017 - Review Complete 07/02/2017  Allergen Reaction Noted  . Coumadin [warfarin sodium] Rash 03/29/2012    Vitals: BP 117/69   Pulse (!) 55   Ht 6\' 2"  (1.88 m)   Wt (!) 335 lb (152 kg)   BMI 43.01 kg/m  Last Weight:  Wt Readings  from Last 1 Encounters:  07/02/17 (!) 335 lb (152 kg)   ZOX:WRUE mass index is 43.01 kg/m.     Last Height:   Ht Readings from Last 1 Encounters:  07/02/17 6\' 2"  (1.88 m)    Physical exam:  General: The patient is awake, alert and appears not in acute distress. The patient has poor dentition . Head: Normocephalic, atraumatic. Neck is supple. Mallampati 5,     neck circumference:19. Nasal airflow congested , Cardiovascular:  irregular rate and rhythm, with ejection murmurs , not  carotid bruit, and without distended neck veins. Respiratory: Lungs are clear to auscultation. Skin:  Ankle and leg edema- with pin and needles.  Trunk: BMI is 43.  Neurologic exam : The patient is awake and alert, oriented to place and time.    Attention span & concentration ability appears normal.  Speech is fluent,  with dysphonia .  Mood and affect are appropriate.  Cranial nerves: Pupils are equal and briskly reactive to light. Funduscopic exam deferred . Status post cataract surgery- Extraocular movements  in vertical and horizontal planes intact and without nystagmus. Visual fields by finger perimetry are intact. Hearing to finger rub intact.  Facial sensation intact to fine touch. Facial motor strength : left lower facial droop. tongue and uvula move midline.  Shoulder shrug was symmetrical.   Motor exam:  Normal tone, muscle bulk and symmetric strength in upper extremities. Sensory:  Fine touch, pinprick and vibration was absent in both feet and below knee.  Coordination: Rapid alternating movements in the fingers/hands was normal. Patient is left handed.  Finger-to-nose maneuver normal without evidence of ataxia, dysmetria or tremor.  Gait and station: Patient walks with a cane as assistive device. Not further evaluated .  Deep tendon reflexes: in the  upper and lower extremities are asymmetric - left upper extremity reflexes were more brisk than right, but he reports his stroke affected the right body (?) no patella or achilles tendon reflexes were elicited.     I found the recent sleep study-  I would like to thank Harrold Donath, wife will forward a copy of the sleep study to me the patient was at the time followed by Dr. Burnell Blanks, referred to [facility] the sleep study was done on 09 February 2012.  He had a total number of 143 apneas and hypopneas,  the AHI was 75/h, there was no breakdown into obstructive versus central apnea, REM versus non-REM apnea, supine versus nonsupine apnea.  Periodic limb movements prior to CPAP initiation were seen at an index of 3.9 but there is no definition of related arousals.  I can also not find the total time and oxygen desaturation.  The patient was apparently titrated to CPAP with a final level of 13 cmH2O and an AHI of 0.0 the interpreting physician was Dr. Louis Meckel.   Assessment:  After physical and neurologic examination, review of laboratory studies,  Personal review of imaging studies, reports of other /same  Imaging studies, results of polysomnography and / or neurophysiology testing and pre-existing records as far as provided in visit., my assessment is   1)  Patient with all risk factors for severe and complex sleep apnea- will need an attended sleep study with Co2 and o2 - CHF, anbormal rhythm, atrial fib, ankle edema, HTN and morbid obesity on multiple medications, some are respiratory supressants.  The patient's body mass index is 43.  Please split at AHI 30.  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  He does have tramadol, use gabapentin.  3) I explained that this office does not manage chronic pain, chronic insomnia and non organic sleep disorders.    The patient was advised of the nature of the diagnosed disorder , the treatment options and the  risks for general health and wellness arising from not treating the condition.   I spent more than 55  minutes of face to face time with the patient.  Greater than 50% of time was spent in counseling and coordination of care. We  have discussed the diagnosis and differential and I answered the patient's questions.    Plan:  Treatment plan and additional workup :   Patient may need to sleep in a recliner- SPLIT night at AHI 30 wit Co2 and o2.  He  wants no FFM!!  May need o2 supplementation.  Will bring his pain medication.  Rv : with MD   Melvyn Novas, MD 07/02/2017, 3:19 PM  Certified in Neurology by ABPN Certified in Sleep Medicine by [facility] [address] [place], [place] [address]

## 2017-07-30 ENCOUNTER — Other Ambulatory Visit: Payer: Self-pay

## 2017-07-31 MED ORDER — AMIODARONE HCL 200 MG PO TABS: 200.0000 mg | ORAL_TABLET | Freq: Every day | ORAL | 1 refills | Status: DC

## 2017-07-31 NOTE — Telephone Encounter (Signed)
OK to refill.  Please refill under Dr. Eden EmmsNishan as he is the patient's primary cardiologist

## 2017-08-05 ENCOUNTER — Ambulatory Visit (INDEPENDENT_AMBULATORY_CARE_PROVIDER_SITE_OTHER): Payer: Medicare Other | Admitting: Neurology

## 2017-08-05 DIAGNOSIS — R0902 Hypoxemia: Secondary | ICD-10-CM

## 2017-08-05 DIAGNOSIS — M25472 Effusion, left ankle: Secondary | ICD-10-CM

## 2017-08-05 DIAGNOSIS — G4733 Obstructive sleep apnea (adult) (pediatric): Secondary | ICD-10-CM | POA: Diagnosis not present

## 2017-08-05 DIAGNOSIS — G4731 Primary central sleep apnea: Secondary | ICD-10-CM

## 2017-08-05 DIAGNOSIS — G8929 Other chronic pain: Secondary | ICD-10-CM

## 2017-08-05 DIAGNOSIS — G4701 Insomnia due to medical condition: Secondary | ICD-10-CM

## 2017-08-05 DIAGNOSIS — R0601 Orthopnea: Secondary | ICD-10-CM

## 2017-08-05 DIAGNOSIS — I5042 Chronic combined systolic (congestive) and diastolic (congestive) heart failure: Secondary | ICD-10-CM

## 2017-08-05 DIAGNOSIS — M25471 Effusion, right ankle: Secondary | ICD-10-CM

## 2017-08-05 DIAGNOSIS — F1190 Opioid use, unspecified, uncomplicated: Secondary | ICD-10-CM

## 2017-08-16 NOTE — Addendum Note (Signed)
Addended by: Melvyn Novas on: 08/16/2017 05:00 PM   Modules accepted: Orders

## 2017-08-16 NOTE — Procedures (Signed)
PATIENT'S NAME:  Jorge [name], Jorge [name] DOB:                   03-Jul-1945      MR#:    [id number]     DATE OF RECORDING: 08/05/2017 REFERRING M.D.:  [name], PA-C Study Performed:  Split-Night Titration Study HISTORY: CEBASTIAN NEIS is a 72 y.o. male patient who is audibly wheezing and short of breath, morbidly obese. He reports having difficulties sleeping. He has memory trouble since a stroke and had a seizure in 2000. He indicated that his sleep is very limited and that he only gets 2-3 hours each night with great difficulties. He sleeps either while sitting or reclining on 4 pillows with orthopnea and he is known to snore and have apnea.  Carries a diagnosis of depression, heart disease with atrial fibrillation, hypertension, GERD, morbid obesity, but his last cardiac workup was on 01 March 2017 for shortness of breath, and was unremarkable. Diagnosed with sleep apnea over 15 years ago, he has not used CPAP in many years.  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     Atrial Fibrillation, Chest pain, orthopnea, CHF, Depression, Hypertension, Hyperlipidemia, Neuropathy, Obesity, Shortness of breath, and Stroke.  The patient endorsed the Epworth Sleepiness Scale at 13/24 points. The patient's weight 335 pounds with a height of 74 (inches), resulting in a BMI of 43.2 kg/m2. The patient's neck circumference measured 19 inches.  CURRENT MEDICATIONS: Tylenol, Pacer one, Lipitor, Wellbutrin, Ensure, Lasix, Neurontin, Synthroid, Lopressor, Prilosec, Glycolax, Aldactone, Ultram, Desyrel, Coumadin.   PROCEDURE:  This is a multichannel digital  polysomnogram utilizing the Somnostar 11.2 system.  Electrodes and sensors were applied and monitored per AASM Specifications.   EEG, EOG, Chin and Limb EMG, were sampled at 200 Hz.  ECG, Snore and Nasal Pressure, Thermal Airflow, Respiratory Effort, CPAP Flow and Pressure, Oximetry was sampled at 50 Hz. Digital video and audio were recorded.      BASELINE STUDY WITHOUT CPAP RESULTS: Lights Out was at 00:04 and Lights On at 06:47.  Total recording time (TRT) was 167, with a total sleep time (TST) of 123 minutes.   The patient's sleep latency was 77.5 minutes.  REM latency was 0 minutes.  The sleep efficiency was 73.7 %.    SLEEP ARCHITECTURE: WASO (Wake after sleep onset) was 15 minutes, Stage N1 was 23 minutes, Stage N2 was 24.5 minutes, Stage N3 was 75.5 minutes and Stage R (REM sleep) was 0 minutes.  The percentages were Stage N1 18.7%, Stage N2 19.9%, Stage N3 61.4% and Stage R (REM sleep) 0%.   RESPIRATORY ANALYSIS:  There were a total of 43 respiratory events:  16 obstructive apneas, 0 central apneas and 27 hypopneas with 0 respiratory event related arousals (RERAs).  Snoring was noted.     The total APNEA/HYPOPNEA INDEX (AHI) was 21.0 /hour and the total RESPIRATORY DISTURBANCE INDEX was 21.0 /hour.  0 events occurred in REM sleep and 54 events in NREM. The REM AHI was 0, /hour versus a non-REM AHI of 21.0 /hour. The patient spent 316.5 minutes sleep time in the supine position 0 minutes in non-supine. The supine AHI was 21.0 /hour versus a non-supine  AHI of 0.0 /hour.  OXYGEN SATURATION & C02:  The wake baseline 02 saturation was 93%, with the lowest being 80%. Time spent below 89% saturation equaled 117 minutes. End Tidal CO2 during sleep was 48.8 torr.    PERIODIC LIMB MOVEMENTS: The patient had a total of 0 Periodic Limb Movements.   The arousals were noted as: 31 were spontaneous, 0 were associated with PLMs, and 12 were associated with respiratory events.  Audio and video analysis did  not show any abnormal or unusual movements, behaviors, phonations or vocalizations. The patient took one bathroom break. Some Snoring was noted. EKG was in keeping with sinus rhythm (NSR).   TITRATION STUDY WITH CPAP RESULTS:   CPAP was initiated at 5 cmH20 with heated humidity per AASM split night standards and pressure was advanced to 15 cmH20 because of hypopneas, apneas and desaturations.  At a PAP pressure of 15 cmH20, there was a reduction of the AHI to 0.0 over 26 minutes of sleep - not containing REM sleep. The technician gave the patient a [employer] FFM, model "Simplus" in medium size. Nadir rose to 91% SpO2.   Total recording time (TRT) was 237 minutes, with a total sleep time (TST) of 193.5 minutes. The patient's sleep latency was 103.5 minutes. REM latency was 0 minutes.  The sleep efficiency was 81.6 %.    SLEEP ARCHITECTURE: Wake after sleep was 41 minutes, Stage N1 45.5 minutes, Stage N2 55 minutes, Stage N3 93 minutes and Stage R (REM sleep) 0 minutes. The percentages were: Stage N1 23.5%, Stage N2 28.4%, Stage N3 48.1%, and Stage R (REM sleep) 0%.  The arousals were noted as: 52 were spontaneous, 0 were associated with PLMs, and 36 were associated with respiratory events.  RESPIRATORY ANALYSIS:  There were a total of 74 respiratory events: 55 obstructive apneas, 0 central apneas and 19 hypopneas with a hypopnea index of 5.9 /hour. The patient also had 0 respiratory event related arousals (RERAs). The total APNEA/HYPOPNEA INDEX (AHI) was 22.9 /hour and the total RESPIRATORY DISTURBANCE INDEX was 22.9 /hour.  0 events occurred in REM sleep and 74 events in NREM. The REM AHI was 0 /hour versus a non-REM AHI of 22.9 /hour. The patient spent 100% of total sleep time in the supine position.  OXYGEN SATURATION & C02:  The wake baseline 02 saturation was 92%, with the lowest being 79%. Time spent below 89% saturation equaled 47 minutes.  PERIODIC LIMB MOVEMENTS:   The patient had a  total of 0 Periodic Limb Movements. Post-study, the patient indicated that sleep was better than usual. The patient was fitted with a FFM.  POLYSOMNOGRAPHY IMPRESSION :   1. Obstructive Sleep Apnea (OSA) AHI 21/h with all sleep supine and in NREM, responding to CPAP at 15 cm water with use of a FFM. No additional oxygen was needed to alleviate the hypoxemia, nadir rose form 79% to 91% with a high sleep efficiency.   RECOMMENDATIONS: Ato titration capable CPAP to be set at 15 cm water, with heated humidity and use with a mask of patient's choice and  comfort.  If he likes the [facility] he can stay with it, otherwise change to a N 30i nasal cradle mask. He had pre study explicitly asked not to use a FFM .   A follow up appointment will be scheduled in the Sleep Clinic at [facility] after 60-90 days of CPAP therapy, with treatment compliance defined as 4 hours or more of nightly use.  I certify that I have reviewed the entire raw data recording prior to the issuance of this report in accordance with the Standards of Accreditation of the American Academy of Sleep Medicine (AASM)      Melvyn Novas, M.D.     08-16-2017 Diplomat, American Board of Psychiatry and Neurology  Diplomat, American Board of Sleep Medicine Medical Director, [place] [facility]

## 2017-08-18 ENCOUNTER — Telehealth: Payer: Self-pay | Admitting: Neurology

## 2017-08-18 NOTE — Telephone Encounter (Signed)
-----   Message from Jorge Novas, MD sent at 08/16/2017  5:00 PM EDT ----- Obstructive Sleep Apnea (OSA) AHI 21/h with all sleep supine  and in NREM, responding to CPAP at 15 cm water with use of a FFM.  No additional oxygen was needed to alleviate the hypoxemia, nadir  rose form 79% to 91% with a high sleep efficiency.  RECOMMENDATIONS: Ato titration capable CPAP to be set at 15 cm  water, with heated humidity and use with a mask of patient's  choice and comfort. If he likes the [facility] he can stay with it, otherwise change to a N  30i nasal cradle mask. He had pre study explicitly asked not to  use a FFM .

## 2017-08-18 NOTE — Telephone Encounter (Signed)
I called pt. I advised pt that Dr. Vickey Huger reviewed their sleep study results and found that pt has sleep apnea. Dr. Vickey Huger recommends that pt starts a cpap at a pressure of 15 cm of water pressure. I reviewed PAP compliance expectations with the pt. Pt is agreeable to starting a CPAP. I advised pt that an order will be sent to a DME, [facility], and [facility] will call the pt within about one week after they file with the pt's insurance. [facility] will show the pt how to use the machine, fit for masks, and troubleshoot the CPAP if needed. A follow up appt was made for insurance purposes with Dr. Marylou Flesher on Aug 26, 2:30 pm . Pt verbalized understanding to arrive 15 minutes early and bring their CPAP. A letter with all of this information in it will be mailed to the pt as a reminder. I verified with the pt that the address we have on file is correct. Pt verbalized understanding of results. Pt had no questions at this time but was encouraged to call back if questions arise.

## 2017-11-30 ENCOUNTER — Telehealth: Payer: Self-pay | Admitting: Neurology

## 2017-11-30 ENCOUNTER — Ambulatory Visit: Payer: Self-pay | Admitting: Neurology

## 2017-11-30 NOTE — Telephone Encounter (Signed)
Patient no showed to apt today. Patient is a new initial CPAP pt was due for his follow up apt on 11/27/17. Pt needs apt asap with anyone if opening allows when he calls back, otherwise he will have to turn machine in per insurance guidelines.

## 2017-12-01 ENCOUNTER — Encounter: Payer: Self-pay | Admitting: Neurology

## 2017-12-03 ENCOUNTER — Observation Stay (HOSPITAL_COMMUNITY)
Admission: EM | Admit: 2017-12-03 | Discharge: 2017-12-05 | Disposition: A | Discharge status: Home / Self Care | Payer: Medicare Other | Attending: Internal Medicine | Admitting: Internal Medicine

## 2017-12-03 ENCOUNTER — Encounter (HOSPITAL_COMMUNITY): Payer: Self-pay | Admitting: *Deleted

## 2017-12-03 ENCOUNTER — Emergency Department (HOSPITAL_COMMUNITY): Payer: Medicare Other

## 2017-12-03 ENCOUNTER — Other Ambulatory Visit: Payer: Self-pay

## 2017-12-03 DIAGNOSIS — Z8249 Family history of ischemic heart disease and other diseases of the circulatory system: Secondary | ICD-10-CM | POA: Insufficient documentation

## 2017-12-03 DIAGNOSIS — I5032 Chronic diastolic (congestive) heart failure: Secondary | ICD-10-CM | POA: Insufficient documentation

## 2017-12-03 DIAGNOSIS — Z888 Allergy status to other drugs, medicaments and biological substances status: Secondary | ICD-10-CM | POA: Diagnosis not present

## 2017-12-03 DIAGNOSIS — I69351 Hemiplegia and hemiparesis following cerebral infarction affecting right dominant side: Secondary | ICD-10-CM | POA: Insufficient documentation

## 2017-12-03 DIAGNOSIS — J449 Chronic obstructive pulmonary disease, unspecified: Secondary | ICD-10-CM | POA: Insufficient documentation

## 2017-12-03 DIAGNOSIS — R0789 Other chest pain: Secondary | ICD-10-CM | POA: Diagnosis not present

## 2017-12-03 DIAGNOSIS — E785 Hyperlipidemia, unspecified: Secondary | ICD-10-CM | POA: Diagnosis present

## 2017-12-03 DIAGNOSIS — R002 Palpitations: Secondary | ICD-10-CM | POA: Insufficient documentation

## 2017-12-03 DIAGNOSIS — N289 Disorder of kidney and ureter, unspecified: Secondary | ICD-10-CM

## 2017-12-03 DIAGNOSIS — Z79899 Other long term (current) drug therapy: Secondary | ICD-10-CM | POA: Insufficient documentation

## 2017-12-03 DIAGNOSIS — I6930 Unspecified sequelae of cerebral infarction: Secondary | ICD-10-CM

## 2017-12-03 DIAGNOSIS — Z87891 Personal history of nicotine dependence: Secondary | ICD-10-CM | POA: Insufficient documentation

## 2017-12-03 DIAGNOSIS — G629 Polyneuropathy, unspecified: Secondary | ICD-10-CM | POA: Diagnosis not present

## 2017-12-03 DIAGNOSIS — I130 Hypertensive heart and chronic kidney disease with heart failure and stage 1 through stage 4 chronic kidney disease, or unspecified chronic kidney disease: Secondary | ICD-10-CM | POA: Diagnosis not present

## 2017-12-03 DIAGNOSIS — R079 Chest pain, unspecified: Secondary | ICD-10-CM | POA: Diagnosis not present

## 2017-12-03 DIAGNOSIS — I10 Essential (primary) hypertension: Secondary | ICD-10-CM | POA: Diagnosis present

## 2017-12-03 DIAGNOSIS — F329 Major depressive disorder, single episode, unspecified: Secondary | ICD-10-CM | POA: Diagnosis not present

## 2017-12-03 DIAGNOSIS — Z952 Presence of prosthetic heart valve: Secondary | ICD-10-CM | POA: Insufficient documentation

## 2017-12-03 DIAGNOSIS — G4733 Obstructive sleep apnea (adult) (pediatric): Secondary | ICD-10-CM | POA: Insufficient documentation

## 2017-12-03 DIAGNOSIS — Z9989 Dependence on other enabling machines and devices: Secondary | ICD-10-CM | POA: Diagnosis not present

## 2017-12-03 DIAGNOSIS — N183 Chronic kidney disease, stage 3 (moderate): Secondary | ICD-10-CM | POA: Diagnosis not present

## 2017-12-03 DIAGNOSIS — R0600 Dyspnea, unspecified: Secondary | ICD-10-CM | POA: Diagnosis present

## 2017-12-03 DIAGNOSIS — E782 Mixed hyperlipidemia: Secondary | ICD-10-CM | POA: Diagnosis not present

## 2017-12-03 DIAGNOSIS — Z7901 Long term (current) use of anticoagulants: Secondary | ICD-10-CM | POA: Insufficient documentation

## 2017-12-03 DIAGNOSIS — E039 Hypothyroidism, unspecified: Secondary | ICD-10-CM | POA: Diagnosis not present

## 2017-12-03 DIAGNOSIS — I480 Paroxysmal atrial fibrillation: Secondary | ICD-10-CM | POA: Diagnosis present

## 2017-12-03 DIAGNOSIS — Z6841 Body Mass Index (BMI) 40.0 and over, adult: Secondary | ICD-10-CM | POA: Insufficient documentation

## 2017-12-03 DIAGNOSIS — N400 Enlarged prostate without lower urinary tract symptoms: Secondary | ICD-10-CM | POA: Diagnosis present

## 2017-12-03 DIAGNOSIS — I2510 Atherosclerotic heart disease of native coronary artery without angina pectoris: Secondary | ICD-10-CM | POA: Diagnosis present

## 2017-12-03 DIAGNOSIS — N401 Enlarged prostate with lower urinary tract symptoms: Secondary | ICD-10-CM | POA: Diagnosis not present

## 2017-12-03 DIAGNOSIS — E66813 Obesity, class 3: Secondary | ICD-10-CM | POA: Diagnosis present

## 2017-12-03 HISTORY — DX: Disorder of kidney and ureter, unspecified: N28.9

## 2017-12-03 LAB — I-STAT TROPONIN, ED: TROPONIN I, POC: 0 ng/mL (ref 0.00–0.08)

## 2017-12-03 LAB — CBC
HEMATOCRIT: 46.5 % (ref 39.0–52.0)
Hemoglobin: 15.4 g/dL (ref 13.0–17.0)
MCH: 31.3 pg (ref 26.0–34.0)
MCHC: 33.1 g/dL (ref 30.0–36.0)
MCV: 94.5 fL (ref 78.0–100.0)
PLATELETS: 245 10*3/uL (ref 150–400)
RBC: 4.92 MIL/uL (ref 4.22–5.81)
RDW: 13.3 % (ref 11.5–15.5)
WBC: 9.2 10*3/uL (ref 4.0–10.5)

## 2017-12-03 LAB — BASIC METABOLIC PANEL
ANION GAP: 14 (ref 5–15)
BUN: 22 mg/dL (ref 8–23)
CALCIUM: 8.7 mg/dL — AB (ref 8.9–10.3)
CO2: 26 mmol/L (ref 22–32)
CREATININE: 1.48 mg/dL — AB (ref 0.61–1.24)
Chloride: 94 mmol/L — ABNORMAL LOW (ref 98–111)
GFR, EST AFRICAN AMERICAN: 53 mL/min — AB (ref >60)
GFR, EST NON AFRICAN AMERICAN: 46 mL/min — AB (ref >60)
Glucose, Bld: 133 mg/dL — ABNORMAL HIGH (ref 70–99)
Potassium: 4.1 mmol/L (ref 3.5–5.1)
SODIUM: 134 mmol/L — AB (ref 135–145)

## 2017-12-03 LAB — PROTIME-INR
INR: 4.83
PROTHROMBIN TIME: 44.8 s — AB (ref 11.4–15.2)

## 2017-12-03 MED ORDER — FUROSEMIDE 10 MG/ML IJ SOLN
80.0000 mg | Freq: Once | INTRAMUSCULAR | Status: AC
  Administered 2017-12-04: 80 mg via INTRAVENOUS
  Filled 2017-12-03: qty 8

## 2017-12-03 MED ORDER — MORPHINE SULFATE (PF) 2 MG/ML IV SOLN
2.0000 mg | INTRAVENOUS | Status: DC | PRN
  Administered 2017-12-03 – 2017-12-04 (×3): 2 mg via INTRAVENOUS
  Filled 2017-12-03 (×3): qty 1

## 2017-12-03 MED ORDER — GI COCKTAIL ~~LOC~~
30.0000 mL | Freq: Four times a day (QID) | ORAL | Status: DC | PRN
  Administered 2017-12-03: 30 mL via ORAL
  Filled 2017-12-03: qty 30

## 2017-12-03 MED ORDER — ACETAMINOPHEN 325 MG PO TABS: 650.0000 mg | ORAL_TABLET | ORAL | Status: DC | PRN

## 2017-12-03 MED ORDER — ONDANSETRON HCL 4 MG/2ML IJ SOLN: 4.0000 mg | Freq: Four times a day (QID) | INTRAMUSCULAR | Status: DC | PRN

## 2017-12-03 MED ORDER — OXYCODONE HCL 5 MG PO TABS
10.0000 mg | ORAL_TABLET | Freq: Once | ORAL | Status: AC
  Administered 2017-12-03: 10 mg via ORAL
  Filled 2017-12-03: qty 2

## 2017-12-03 NOTE — Progress Notes (Signed)
ANTICOAGULATION CONSULT NOTE - Initial Consult  Pharmacy Consult for Warfarin  Indication: Afib, s/p TAVR  No Known Allergies   Vital Signs: Temp: 98.7 F (37.1 C) (08/29 1918) Temp Source: Oral (08/29 1918) BP: 96/63 (08/29 2130) Pulse Rate: 68 (08/29 2130)  Labs: Recent Labs    12/03/17 1957  HGB 15.4  HCT 46.5  PLT 245  LABPROT 44.8*  INR 4.83*  CREATININE 1.48*    CrCl cannot be calculated (Unknown ideal weight.).   Medical History: Past Medical History:  Diagnosis Date  . Atrial fibrillation (HCC)    pt on Eliquis  . Chest pain   . CHF (congestive heart failure) (HCC)   . Depression   . Edema   . HTN (hypertension)   . Hyperlipidemia   . Hypothyroidism   . Neuropathy   . Obesity   . SOB (shortness of breath)   . Stroke [facility](HCC)    Assessment: 72 y/o M on warfarin PTA for afib, also has hx of TAVR and stroke, presents to the ED with chest pain and nausea, INR is SUPRA-therapeutic at 4.83, CBC good  Goal of Therapy:  INR 2-3 Monitor platelets by anticoagulation protocol: Yes   Plan:  -No warfarin tonight -Daily PT/INR -Resume warfarin as INR allows  Abran DukeLedford, [place] Kniskern 12/03/2017,11:25 PM

## 2017-12-03 NOTE — ED Triage Notes (Signed)
Per EMS, pt from home, reports waking up with cp with nausea.  Central cp with lightheadedness.  Hx of heart valve replacement, stroke, and HTN.  ASA 324mg  administered en route.  180/90.  Pt is A&Ox 4.

## 2017-12-03 NOTE — ED Notes (Signed)
Chest pain began at 1530 today as opposed to 0330 this morning.

## 2017-12-03 NOTE — H&P (Addendum)
History and Physical    Jorge [name] ZOX:[id number] DOB: 08-03-1945 DOA: 12/03/2017  Referring MD/NP/PA:Micheal Virgel Bouquet, MD PCP: [name], PA-C  Patient coming from: Home via EMS  Chief Complaint: Chest pain  I have personally briefly reviewed patient's old medical records in [facility]   HPI: Jorge [name] is a 72 y.o. male with medical history significant of HTN, HLD, A. fib on Coumadin, diastolic CHF last EF 60-65% with grade 1 DD, s/p TAVR, CEA ,CVA with residual right-sided weakness, morbid obesity, and OSA on CPAP; who presents with complaints of chest pain.  Symptoms started this afternoon after waking up around 3 PM from a nap.  When he woke up he noted that he was significantly sweaty.  After getting up patient had gone to use the restroom and reported feeling very lightheaded and dizzy.  Thereafter, complained of sharp and heavy substernal chest pain with radiation to the left shoulder.  Notes associated symptoms of it  being hard to take a deep breath, 3-4 pillow orthopnea, approximately 15 pound weight gain in the last 3 days, leg/and swelling, headache, back pain, leg pain, and intermittent wheezing.  Denies having any vomiting, loss of consciousness, fever, abdominal pain, or diarrhea.  He has been taking all of his medications as prescribed.  He follows with Dr. Eden Emms of cardiology.  His last cardiac cath was on 04/30/2016, with Ost CX to proximal Cx lesion with 40% stenosis.  Patient reports that he really needs to be placed back on oxygen as he can only walk short distances before coming extremely short of breath.   En route with EMS patient was given 324 mg of aspirin.  ED Course: Upon admission to the emergency department patient was noted to have vital signs within normal limits.  Labs revealed normal CBC, sodium 134, BUN 22, creatinine 1.48, INR 4.83, and troponin 0.  EKG showed no significant ischemic changes.  Patient was given 10 mg of oxycodone x1 dose orally.   TRH called to admit for chest pain.  Patient currently rates pain as a 5- 6 out of 10.  Review of Systems  Constitutional: Positive for diaphoresis and malaise/fatigue. Negative for chills and fever.  HENT: Negative for congestion and nosebleeds.   Eyes: Negative for photophobia and discharge.  Respiratory: Positive for shortness of breath and wheezing.   Cardiovascular: Positive for chest pain, orthopnea and leg swelling.  Gastrointestinal: Positive for nausea. Negative for vomiting.  Genitourinary: Positive for dysuria and frequency.  Musculoskeletal: Positive for back pain and myalgias. Negative for falls.  Skin: Negative for itching.  Neurological: Positive for dizziness and headaches. Negative for speech change and loss of consciousness.  Endo/Heme/Allergies: Positive for environmental allergies.  Psychiatric/Behavioral: Positive for memory loss. Negative for substance abuse.    Past Medical History:  Diagnosis Date  . Atrial fibrillation (HCC)    pt on Eliquis  . Chest pain   . CHF (congestive heart failure) (HCC)   . Depression   . Edema   . HTN (hypertension)   . Hyperlipidemia   . Hypothyroidism   . Neuropathy   . Obesity   . SOB (shortness of breath)   . Stroke [facility])     Past Surgical History:  Procedure Laterality Date  . BACK SURGERY    . CARDIAC CATHETERIZATION    . CARDIAC CATHETERIZATION N/A 04/30/2016   Procedure: Right/Left Heart Cath and Coronary Angiography;  Surgeon: Kathleene Hazel, MD;  Location: [facility] INVASIVE CV LAB;  Service: Cardiovascular;  Laterality: N/A;  . CAROTID ANGIOGRAM N/A 05/30/2014   Procedure: Dorise BullionERBRAL  ANGIOGRAM;  Surgeon: Nada LibmanVance W Brabham, MD;  [facility];  Service: Cardiovascular;  Laterality: N/A;  . MULTIPLE EXTRACTIONS WITH ALVEOLOPLASTY N/A 05/02/2016   Procedure: Extraction of tooth #'s 7, 10, 23, 24, 25,and 26 with alveoloplasty.;  Surgeon: Charlynne Panderonald F Kulinski, DDS;  Location: [facility] OR;  Service: Oral Surgery;   Laterality: N/A;  . TEE WITHOUT CARDIOVERSION N/A 05/06/2016   Procedure: TRANSESOPHAGEAL ECHOCARDIOGRAM (TEE);  Surgeon: Kathleene Hazelhristopher D McAlhany, MD;  [facility];  Service: Open Heart Surgery;  Laterality: N/A;  . TRANSCATHETER AORTIC VALVE REPLACEMENT, TRANSFEMORAL N/A 05/06/2016   Procedure: TRANSCATHETER AORTIC VALVE REPLACEMENT, TRANSFEMORAL using a 23mm Edwards Sapien 3 Transcatheter Heart Valve;  Surgeon: Kathleene Hazelhristopher D McAlhany, MD;  Location: [facility] OR;  Service: Open Heart Surgery;  Laterality: N/A;     reports that he has quit smoking. He quit smokeless tobacco use about 21 years ago. He reports that he does not drink alcohol or use drugs.  Allergies  Allergen Reactions  . Coumadin [Warfarin Sodium] Rash    Family History  Problem Relation Age of Onset  . Heart attack Mother   . Stroke Father     Prior to Admission medications   Medication Sig Start Date End Date Taking? Authorizing Provider  acetaminophen (TYLENOL) 500 MG tablet Take 1,000 mg by mouth daily as needed.    [provider]  amiodarone (PACERONE) 200 MG tablet Take 1 tablet (200 mg total) by mouth daily. 07/31/17   Wendall StadeNishan, Peter C, MD  atorvastatin (LIPITOR) 40 MG tablet Take 1 tablet (40 mg total) by mouth daily at 6 PM. 05/31/14   Lorenda Hatchetothman, Adam L, MD  buPROPion [facility](WELLBUTRIN SR) 150 MG 12 hr tablet Take 450 mg by mouth daily.     [provider]  feeding supplement, ENSURE ENLIVE, (ENSURE ENLIVE) LIQD Take 237 mLs by mouth daily. 05/09/16   Rolly SalterPatel, Pranav M, MD  furosemide (LASIX) 80 MG tablet Take 120 mg by mouth 2 (two) times daily. PATIENT TAKES 1 AND A HALF TABLETS BY MOUTH TWICE DAILY     [provider]  gabapentin (NEURONTIN) 400 MG capsule Take 400 mg by mouth 3 (three) times daily.    [provider]  levothyroxine (SYNTHROID, LEVOTHROID) 100 MCG tablet  05/29/17   [provider]  metoprolol tartrate (LOPRESSOR) 25 MG tablet Take 12.5 mg by mouth 2 (two) times daily.     [provider]  omeprazole (PRILOSEC) 20 MG capsule Take 1 capsule (20 mg total) by mouth 2 (two) times daily before a meal. 03/01/17   Delo, Riley Lamouglas, MD  polyethylene glycol powder (GLYCOLAX/MIRALAX) powder Take 17 g by mouth daily. 03/01/17   Geoffery Lyonselo, Douglas, MD  spironolactone (ALDACTONE) 50 MG tablet Take 50 mg by mouth daily.    [provider]  traMADol (ULTRAM) 50 MG tablet Take 50 mg by mouth every 6 (six) hours as needed for moderate pain.     [provider]  traZODone (DESYREL) 100 MG tablet Take 100 mg by mouth at bedtime.    [provider]  warfarin (COUMADIN) 5 MG tablet Take 5 mg by mouth daily. Take as directed by PCP    [provider]    Physical Exam:  Constitutional: Morbidly obese male in appears to be in mild discomfort Vitals:   12/03/17 1918 12/03/17 2100 12/03/17 2115 12/03/17 2130  BP:  102/89 113/86 96/63  Pulse: 67 65 68  68  Resp: 16 13    Temp: 98.7 F (37.1 C)     TempSrc: Oral     SpO2: 98% 93% 92% 94%   Eyes: PERRL, lids and conjunctivae normal ENMT: Mucous membranes are moist. Posterior pharynx clear of any exudate or lesions.  Neck: normal, supple, no masses, no thyromegaly Respiratory: Some possible crackles heard in the lower lung fields., no wheezing, no crackles. Normal respiratory effort. No accessory muscle use.  Cardiovascular: Regular rate and rhythm, no murmurs / rubs / gallops.  Trace lower extremity edema. 2+ pedal pulses. No carotid bruits.  Abdomen: no tenderness, no masses palpated. No hepatosplenomegaly. Bowel sounds positive.  Musculoskeletal: no clubbing / cyanosis. No joint deformity upper and lower extremities. Good ROM, no contractures. Normal muscle tone.  Skin: no rashes, lesions, ulcers. No induration Neurologic: CN 2-12 grossly intact. Sensation intact, DTR normal. Strength 4+/5 on right upper and lower extremity 5/5 on left.  Psychiatric: Normal judgment and insight. Alert and  oriented x 3. Normal mood.     Labs on Admission: I have personally reviewed following labs and imaging studies  CBC: Recent Labs  Lab 12/03/17 1957  WBC 9.2  HGB 15.4  HCT 46.5  MCV 94.5  PLT 245   Basic Metabolic Panel: Recent Labs  Lab 12/03/17 1957  NA 134*  K 4.1  CL 94*  CO2 26  GLUCOSE 133*  BUN 22  CREATININE 1.48*  CALCIUM 8.7*   GFR: CrCl cannot be calculated (Unknown ideal weight.). Liver Function Tests: No results for input(s): AST, ALT, ALKPHOS, BILITOT, PROT, ALBUMIN in the last 168 hours. No results for input(s): LIPASE, AMYLASE in the last 168 hours. No results for input(s): AMMONIA in the last 168 hours. Coagulation Profile: Recent Labs  Lab 12/03/17 1957  INR 4.83*   Cardiac Enzymes: No results for input(s): CKTOTAL, CKMB, CKMBINDEX, TROPONINI in the last 168 hours. BNP (last 3 results) Recent Labs    01/14/17 0852  PROBNP 79   HbA1C: No results for input(s): HGBA1C in the last 72 hours. CBG: No results for input(s): GLUCAP in the last 168 hours. Lipid Profile: No results for input(s): CHOL, HDL, LDLCALC, TRIG, CHOLHDL, LDLDIRECT in the last 72 hours. Thyroid Function Tests: No results for input(s): TSH, T4TOTAL, FREET4, T3FREE, THYROIDAB in the last 72 hours. Anemia Panel: No results for input(s): VITAMINB12, FOLATE, FERRITIN, TIBC, IRON, RETICCTPCT in the last 72 hours. Urine analysis:    Component Value Date/Time   COLORURINE YELLOW 04/29/2016 1233   APPEARANCEUR CLEAR 04/29/2016 1233   LABSPEC 1.021 04/29/2016 1233   PHURINE 6.0 04/29/2016 1233   GLUCOSEU NEGATIVE 04/29/2016 1233   HGBUR SMALL (A) 04/29/2016 1233   BILIRUBINUR NEGATIVE 04/29/2016 1233   KETONESUR NEGATIVE 04/29/2016 1233   PROTEINUR NEGATIVE 04/29/2016 1233   NITRITE NEGATIVE 04/29/2016 1233   LEUKOCYTESUR NEGATIVE 04/29/2016 1233   Sepsis Labs: No results found for this or any previous visit (from the past 240 hour(s)).   Radiological Exams on  Admission: Dg Chest 2 View  Result Date: 12/03/2017 CLINICAL DATA:  72 year old male with chest pain, and shortness of breath. EXAM: CHEST - 2 VIEW COMPARISON:  Chest radiograph dated 03/01/2017 FINDINGS: There is left lung base subsegmental atelectasis. No lobar consolidation, pleural effusion, or pneumothorax. There is stable cardiomegaly. Aortic valve repair. No acute osseous pathology. IMPRESSION: 1. No acute cardiopulmonary process. 2. Stable cardiomegaly and left lung base subsegmental atelectasis. Electronically Signed   By: Elgie Collard M.D.   On: 12/03/2017  21:16    EKG: Independently reviewed.  Normal sinus rhythm at 65 bpm with left axis deviation and signs of LVH.  Assessment/Plan Chest pain, history of CAD: Acute.  Patient reports having acute onset of chest pain after waking up from nap.  Initial troponin noted to be 0 with EKG showing no significant ischemic changes.  Patient was given 325 mg of aspirin prior to arrival.  Last cardiac cath in 04/2016 showed Ost CX to proximal Cx lesion with 40% stenosis.  Patient followed by Dr. Eden Emms. - Admit to a telemetry bed to stepdown due to continued chest - Cardiac troponins - Check lipid panel in a.m. - Morphine as needed for pain - Message sent for cardiology to eval in a.m.  Possible diastolic CHF exacerbation: Patient reports weight gain of 15 pounds over the last 3 days.  Last EF 60-65% with grade 1 DD in 05/2017.  Chest x-ray showing stable cardiomegaly.  Patient normally takes 120 mg of Lasix twice daily.  Also on differential includes uncontrolled hypothyroidism. - Strict I&Os - Daily weights - Lasix 80 mg IV x1 dose now - Reassess in a.m. and determine if needed continue diuresis  A. fib on chronic anticoagulation, supratherapeutic INR: CHA2DS2-VASc score = 6. Patient found to have elevated INR of 4.83 on admission. - Hold Coumadin  - Continue amiodarone  Dyspnea: Patient reports significant shortness of breath with even  minimal exertion.  Reports previously being on oxygen at one time. - Patient may benefit from 6-minute walk when medically stable  Renal insufficiency on chronic kidney disease stage III: Baseline creatinine previously noted to be around 1.3.  Patient presents with a creatinine of 1.48 with BUN 22. -   Recheck BMP in a.m.  CVA with residual deficit: Patient with residual right-sided weakness requiring use of a cane to ambulate. - Continue statin  Hypothyroidism: Patient reports taking medications as prescribed of levothyroxine.  Last TSH noted to be elevated at 6.15 in 01/2017. 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 - Check urinalysis - Check postvoid residual - Trial of Flomax, if UA negative  Morbid obesity: BMI 43.05   OSA on CPAP - Continue CPAP nightly  DVT prophylaxis: On Coumadin Code Status: Full Family Communication: No family present at bedside Disposition Plan: Likely discharge home once medically stable Consults called: none Admission status: observation  Clydie Braun MD [facility] Pager [phone]   If 7PM-7AM, please contact night-coverage [email or web address] Password [email or web address]  12/03/2017, 10:14 PM

## 2017-12-03 NOTE — ED Provider Notes (Signed)
[facility] Emergency Department Provider Note MRN:  [id number]  Arrival date & time: 12/03/17     Chief Complaint   Chest Pain  History of Present Illness   Jorge [name] is a 72 y.o. year-old male with a history of A. fib, CHF, valve replacement presenting to the ED with chief complaint of chest pain.  Sudden onset chest pain, located on the left side of the chest, nonradiating, began today at 3 PM patient was attempting to urinate.  Constant pain since that time, described as a dull pressure, worse with deep breaths.  Denies headache or vision change, endorsing some dizziness and nausea with the chest pain, no abdominal pain, no dysuria.  Review of Systems  A complete 10 system review of systems was obtained and all systems are negative except as noted in the HPI and PMH.   Patient's Health History    Past Medical History:  Diagnosis Date  . Atrial fibrillation (HCC)    pt on Eliquis  . Chest pain   . CHF (congestive heart failure) (HCC)   . Depression   . Edema   . HTN (hypertension)   . Hyperlipidemia   . Hypothyroidism   . Neuropathy   . Obesity   . SOB (shortness of breath)   . Stroke [facility])     Past Surgical History:  Procedure Laterality Date  . BACK SURGERY    . CARDIAC CATHETERIZATION    . CARDIAC CATHETERIZATION N/A 04/30/2016   Procedure: Right/Left Heart Cath and Coronary Angiography;  Surgeon: Kathleene Hazel, MD;  Location: [facility] INVASIVE CV LAB;  Service: Cardiovascular;  Laterality: N/A;  . CAROTID ANGIOGRAM N/A 05/30/2014   Procedure: Dorise Bullion;  Surgeon: Nada Libman, MD;  Location[facility] CATH LAB;  Service: Cardiovascular;  Laterality: N/A;  . MULTIPLE EXTRACTIONS WITH ALVEOLOPLASTY N/A 05/02/2016   Procedure: Extraction of tooth #'s 7, 10, 23, 24, 25,and 26 with alveoloplasty.;  Surgeon: Charlynne Pander, DDS;  Location: [facility] OR;  Service: Oral Surgery;  Laterality: N/A;  . TEE WITHOUT CARDIOVERSION N/A 05/06/2016   Procedure: TRANSESOPHAGEAL ECHOCARDIOGRAM (TEE);  Surgeon: Kathleene Hazel, MD;  Location: [facility] OR;  Service: Open Heart Surgery;  Laterality: N/A;  . TRANSCATHETER AORTIC VALVE REPLACEMENT, TRANSFEMORAL N/A 05/06/2016   Procedure: TRANSCATHETER AORTIC VALVE REPLACEMENT, TRANSFEMORAL using a 23mm Edwards Sapien 3 Transcatheter Heart Valve;  Surgeon: Kathleene Hazel, MD;  Location: [facility] OR;  Service: Open Heart Surgery;  Laterality: N/A;    Family History  Problem Relation Age of Onset  . Heart attack Mother   . Stroke Father     Social History   Socioeconomic History  . Marital status: Divorced    Spouse name: Not on file  . Number of children: Not on file  . Years of education: Not on file  . Highest education level: Not on file  Occupational History  . Not on file  Social Needs  . Financial resource strain: Not on file  . Food insecurity:    Worry: Not on file    Inability: Not on file  . Transportation needs:    Medical: Not on file    Non-medical: Not on file  Tobacco Use  . Smoking status: Former Games developer  . Smokeless tobacco: Former Neurosurgeon    Quit date: 03/22/1996  Substance and Sexual Activity  . Alcohol use: No  . Drug use: No  . Sexual activity: Not on file  Lifestyle  . Physical activity:  Days per week: Not on file    Minutes per session: Not on file  . Stress: Not on file  Relationships  . Social connections:    Talks on phone: Not on file    Gets together: Not on file    Attends religious service: Not on file    Active member of club or organization: Not on file    Attends meetings of clubs or organizations: Not on file    Relationship status: Not on file  . Intimate partner violence:    Fear of current or ex partner: Not on file    Emotionally abused: Not on file    Physically abused: Not on file    Forced sexual activity: Not on file  Other Topics Concern  . Not on file  Social History Narrative  . Not on file     Physical Exam  Vital  Signs and Nursing Notes reviewed Vitals:   12/03/17 2115 12/03/17 2130  BP: 113/86 96/63  Pulse: 68 68  Resp:    Temp:    SpO2: 92% 94%    CONSTITUTIONAL: Chronically ill-appearing, NAD NEURO:  Alert and oriented x 3, no focal deficits EYES:  eyes equal and reactive ENT/NECK:  no LAD, no JVD CARDIO: Regular rate, well-perfused, normal S1 and S2 PULM:  CTAB no wheezing or rhonchi GI/GU:  normal bowel sounds, non-distended, non-tender MSK/SPINE:  No gross deformities, no edema SKIN:  no rash, atraumatic PSYCH:  Appropriate speech and behavior  Diagnostic and Interventional Summary    EKG Interpretation  Date/Time:  Thursday December 03 2017 19:25:17 EDT Ventricular Rate:  65 PR Interval:  200 QRS Duration: 116 QT Interval:  454 QTC Calculation: 472 R Axis:   -44 Text Interpretation:  Normal sinus rhythm Left axis deviation Left ventricular hypertrophy with QRS widening Cannot rule out Septal infarct , age undetermined Abnormal ECG Confirmed by Kennis Carina [phone]) on 12/03/2017 7:58:12 PM      Labs Reviewed  BASIC METABOLIC PANEL - Abnormal; Notable for the following components:      Result Value   Sodium 134 (*)    Chloride 94 (*)    Glucose, Bld 133 (*)    Creatinine, Ser 1.48 (*)    Calcium 8.7 (*)    GFR calc non Af Amer 46 (*)    GFR calc Af Amer 53 (*)    All other components within normal limits  PROTIME-INR - Abnormal; Notable for the following components:   Prothrombin Time 44.8 (*)    INR 4.83 (*)    All other components within normal limits  CBC  I-STAT TROPONIN, ED    DG Chest 2 View  Final Result      Medications  oxyCODONE (Oxy IR/ROXICODONE) immediate release tablet 10 mg (10 mg Oral Given 12/03/17 2053)     Procedures Critical Care  ED Course and Medical Decision Making  I have reviewed the triage vital signs and the nursing notes.  Pertinent labs & imaging results that were available during my care of the patient were reviewed by me and  considered in my medical decision making (see below for details). Clinical Course as of Dec 03 2152  Thu Dec 03, 2017  1958 Pressure-like chest pain in this 72 year old male, history of CHF, history of hypertension, obesity.  Considering ACS, less likely PE given no tachycardia, no hypoxia, no evidence of DVT, anticoagulated at baseline.  Work-up pending.  EKG with no ischemic changes.   [MB]  Clinical Course User Index [MB] Sabas SousBero, Melady Chow M, MD    Troponin negative.  Per chart review, patient had catheterization 1.5 years ago, at that time with evidence of mild CAD.  Given today's chest pain, risk factors, will consult hospitalist for admission for consideration of repeat ischemic evaluation.  Elmer SowMichael M. Pilar PlateBero, MD [facility] [email or web address]  Final Clinical Impressions(s) / ED Diagnoses     ICD-10-CM   1. Chest pain, unspecified type R07.9   2. Chest pain R07.9 DG Chest 2 View    DG Chest 2 View    ED Discharge Orders    None         Sabas SousBero, Sama Arauz M, MD 12/03/17 2154

## 2017-12-03 NOTE — ED Notes (Signed)
Patient transported to X-ray 

## 2017-12-04 ENCOUNTER — Observation Stay (HOSPITAL_BASED_OUTPATIENT_CLINIC_OR_DEPARTMENT_OTHER): Payer: Medicare Other

## 2017-12-04 ENCOUNTER — Encounter (HOSPITAL_COMMUNITY): Payer: Self-pay

## 2017-12-04 ENCOUNTER — Other Ambulatory Visit: Payer: Self-pay

## 2017-12-04 DIAGNOSIS — R079 Chest pain, unspecified: Secondary | ICD-10-CM

## 2017-12-04 DIAGNOSIS — I480 Paroxysmal atrial fibrillation: Secondary | ICD-10-CM

## 2017-12-04 DIAGNOSIS — R0602 Shortness of breath: Secondary | ICD-10-CM | POA: Diagnosis not present

## 2017-12-04 DIAGNOSIS — E782 Mixed hyperlipidemia: Secondary | ICD-10-CM

## 2017-12-04 DIAGNOSIS — I2510 Atherosclerotic heart disease of native coronary artery without angina pectoris: Secondary | ICD-10-CM

## 2017-12-04 DIAGNOSIS — R0789 Other chest pain: Secondary | ICD-10-CM | POA: Diagnosis not present

## 2017-12-04 DIAGNOSIS — I6930 Unspecified sequelae of cerebral infarction: Secondary | ICD-10-CM | POA: Diagnosis not present

## 2017-12-04 DIAGNOSIS — I10 Essential (primary) hypertension: Secondary | ICD-10-CM

## 2017-12-04 DIAGNOSIS — N400 Enlarged prostate without lower urinary tract symptoms: Secondary | ICD-10-CM | POA: Diagnosis present

## 2017-12-04 DIAGNOSIS — R0600 Dyspnea, unspecified: Secondary | ICD-10-CM | POA: Diagnosis not present

## 2017-12-04 DIAGNOSIS — I130 Hypertensive heart and chronic kidney disease with heart failure and stage 1 through stage 4 chronic kidney disease, or unspecified chronic kidney disease: Secondary | ICD-10-CM | POA: Diagnosis not present

## 2017-12-04 DIAGNOSIS — I69351 Hemiplegia and hemiparesis following cerebral infarction affecting right dominant side: Secondary | ICD-10-CM | POA: Diagnosis not present

## 2017-12-04 DIAGNOSIS — E66813 Obesity, class 3: Secondary | ICD-10-CM | POA: Diagnosis present

## 2017-12-04 DIAGNOSIS — N289 Disorder of kidney and ureter, unspecified: Secondary | ICD-10-CM

## 2017-12-04 HISTORY — DX: Disorder of kidney and ureter, unspecified: N28.9

## 2017-12-04 LAB — URINALYSIS, ROUTINE W REFLEX MICROSCOPIC
BILIRUBIN URINE: NEGATIVE
Bacteria, UA: NONE SEEN
Glucose, UA: NEGATIVE mg/dL
KETONES UR: NEGATIVE mg/dL
LEUKOCYTES UA: NEGATIVE
NITRITE: NEGATIVE
PROTEIN: NEGATIVE mg/dL
Specific Gravity, Urine: 1.006 (ref 1.005–1.030)
pH: 7 (ref 5.0–8.0)

## 2017-12-04 LAB — MRSA PCR SCREENING: MRSA by PCR: NEGATIVE

## 2017-12-04 LAB — BASIC METABOLIC PANEL
Anion gap: 11 (ref 5–15)
BUN: 22 mg/dL (ref 8–23)
CHLORIDE: 94 mmol/L — AB (ref 98–111)
CO2: 29 mmol/L (ref 22–32)
Calcium: 8.5 mg/dL — ABNORMAL LOW (ref 8.9–10.3)
Creatinine, Ser: 1.43 mg/dL — ABNORMAL HIGH (ref 0.61–1.24)
GFR calc Af Amer: 55 mL/min — ABNORMAL LOW (ref >60)
GFR calc non Af Amer: 47 mL/min — ABNORMAL LOW (ref >60)
Glucose, Bld: 144 mg/dL — ABNORMAL HIGH (ref 70–99)
Potassium: 4 mmol/L (ref 3.5–5.1)
Sodium: 134 mmol/L — ABNORMAL LOW (ref 135–145)

## 2017-12-04 LAB — PROTIME-INR
INR: 4.55 — AB
Prothrombin Time: 42.8 seconds — ABNORMAL HIGH (ref 11.4–15.2)

## 2017-12-04 LAB — CBC WITH DIFFERENTIAL/PLATELET
Abs Immature Granulocytes: 0.1 10*3/uL (ref 0.0–0.1)
Basophils Absolute: 0 10*3/uL (ref 0.0–0.1)
Basophils Relative: 0 %
EOS PCT: 1 %
Eosinophils Absolute: 0.1 10*3/uL (ref 0.0–0.7)
HEMATOCRIT: 46 % (ref 39.0–52.0)
Hemoglobin: 15.2 g/dL (ref 13.0–17.0)
Immature Granulocytes: 1 %
LYMPHS ABS: 1.7 10*3/uL (ref 0.7–4.0)
LYMPHS PCT: 18 %
MCH: 31.3 pg (ref 26.0–34.0)
MCHC: 33 g/dL (ref 30.0–36.0)
MCV: 94.7 fL (ref 78.0–100.0)
MONO ABS: 1 10*3/uL (ref 0.1–1.0)
Monocytes Relative: 10 %
Neutro Abs: 6.5 10*3/uL (ref 1.7–7.7)
Neutrophils Relative %: 70 %
Platelets: 239 10*3/uL (ref 150–400)
RBC: 4.86 MIL/uL (ref 4.22–5.81)
RDW: 13.3 % (ref 11.5–15.5)
WBC: 9.4 10*3/uL (ref 4.0–10.5)

## 2017-12-04 LAB — ECHOCARDIOGRAM COMPLETE
Height: 74 in
Weight: 5365.1146 oz

## 2017-12-04 LAB — T4, FREE: Free T4: 1.03 ng/dL (ref 0.82–1.77)

## 2017-12-04 LAB — LIPID PANEL
CHOL/HDL RATIO: 4.7 ratio
CHOLESTEROL: 206 mg/dL — AB (ref 0–200)
HDL: 44 mg/dL (ref >40)
LDL Cholesterol: 122 mg/dL — ABNORMAL HIGH (ref 0–99)
Triglycerides: 198 mg/dL — ABNORMAL HIGH (ref <150)
VLDL: 40 mg/dL (ref 0–40)

## 2017-12-04 LAB — TSH: TSH: 11.446 u[IU]/mL — ABNORMAL HIGH (ref 0.350–4.500)

## 2017-12-04 LAB — TROPONIN I
Troponin I: <0.03 ng/mL (ref <0.03)
Troponin I: <0.03 ng/mL (ref <0.03)

## 2017-12-04 MED ORDER — LEVOTHYROXINE SODIUM 100 MCG PO TABS
100.0000 ug | ORAL_TABLET | Freq: Every day | ORAL | Status: DC
  Administered 2017-12-04 – 2017-12-05 (×2): 100 ug via ORAL
  Filled 2017-12-04 (×2): qty 1

## 2017-12-04 MED ORDER — PERFLUTREN LIPID MICROSPHERE
1.0000 mL | INTRAVENOUS | Status: AC | PRN
  Filled 2017-12-04: qty 10

## 2017-12-04 MED ORDER — ORAL CARE MOUTH RINSE
15.0000 mL | Freq: Two times a day (BID) | OROMUCOSAL | Status: DC
  Administered 2017-12-04 – 2017-12-05 (×3): 15 mL via OROMUCOSAL

## 2017-12-04 MED ORDER — TAMSULOSIN HCL 0.4 MG PO CAPS
0.4000 mg | ORAL_CAPSULE | Freq: Every day | ORAL | Status: DC
  Administered 2017-12-04 – 2017-12-05 (×3): 0.4 mg via ORAL
  Filled 2017-12-04 (×4): qty 1

## 2017-12-04 MED ORDER — METOPROLOL TARTRATE 12.5 MG HALF TABLET: 12.5000 mg | ORAL_TABLET | Freq: Two times a day (BID) | ORAL | Status: DC

## 2017-12-04 MED ORDER — PANTOPRAZOLE SODIUM 40 MG PO TBEC
40.0000 mg | DELAYED_RELEASE_TABLET | Freq: Every day | ORAL | Status: DC
  Administered 2017-12-04 – 2017-12-05 (×2): 40 mg via ORAL
  Filled 2017-12-04 (×2): qty 1

## 2017-12-04 MED ORDER — OXYCODONE HCL 5 MG PO TABS
10.0000 mg | ORAL_TABLET | Freq: Four times a day (QID) | ORAL | Status: DC | PRN
  Administered 2017-12-04 – 2017-12-05 (×3): 10 mg via ORAL
  Filled 2017-12-04 (×3): qty 2

## 2017-12-04 MED ORDER — AMIODARONE HCL 200 MG PO TABS
200.0000 mg | ORAL_TABLET | Freq: Every day | ORAL | Status: DC
  Administered 2017-12-04 – 2017-12-05 (×2): 200 mg via ORAL
  Filled 2017-12-04 (×2): qty 1

## 2017-12-04 MED ORDER — ATORVASTATIN CALCIUM 40 MG PO TABS
40.0000 mg | ORAL_TABLET | Freq: Every day | ORAL | Status: DC
  Administered 2017-12-04 – 2017-12-05 (×2): 40 mg via ORAL
  Filled 2017-12-04 (×2): qty 1

## 2017-12-04 MED ORDER — REGADENOSON 0.4 MG/5ML IV SOLN
INTRAVENOUS | Status: AC
  Filled 2017-12-04: qty 5

## 2017-12-04 MED ORDER — TECHNETIUM TC 99M TETROFOSMIN IV KIT
30.0000 | PACK | Freq: Once | INTRAVENOUS | Status: AC | PRN
  Administered 2017-12-04: 30 via INTRAVENOUS

## 2017-12-04 MED ORDER — REGADENOSON 0.4 MG/5ML IV SOLN
0.4000 mg | Freq: Once | INTRAVENOUS | Status: AC
  Administered 2017-12-04: 0.4 mg via INTRAVENOUS

## 2017-12-04 MED ORDER — BUPROPION HCL ER (SR) 150 MG PO TB12
450.0000 mg | ORAL_TABLET | Freq: Every day | ORAL | Status: DC
  Filled 2017-12-04: qty 3

## 2017-12-04 MED ORDER — PERFLUTREN LIPID MICROSPHERE
INTRAVENOUS | Status: AC
  Administered 2017-12-04: 3 mL
  Filled 2017-12-04: qty 10

## 2017-12-04 MED ORDER — TRAZODONE HCL 50 MG PO TABS
100.0000 mg | ORAL_TABLET | Freq: Every day | ORAL | Status: DC
  Administered 2017-12-04: 100 mg via ORAL
  Filled 2017-12-04: qty 2

## 2017-12-04 MED ORDER — GABAPENTIN 400 MG PO CAPS
400.0000 mg | ORAL_CAPSULE | Freq: Three times a day (TID) | ORAL | Status: DC
  Administered 2017-12-04 – 2017-12-05 (×5): 400 mg via ORAL
  Filled 2017-12-04 (×5): qty 1

## 2017-12-04 MED ORDER — BUPROPION HCL ER (XL) 300 MG PO TB24
450.0000 mg | ORAL_TABLET | Freq: Every day | ORAL | Status: DC
  Administered 2017-12-04 – 2017-12-05 (×2): 450 mg via ORAL
  Filled 2017-12-04 (×2): qty 1

## 2017-12-04 MED ORDER — ASPIRIN 325 MG PO TABS
325.0000 mg | ORAL_TABLET | Freq: Every day | ORAL | Status: DC
  Administered 2017-12-04 – 2017-12-05 (×2): 325 mg via ORAL
  Filled 2017-12-04 (×2): qty 1

## 2017-12-04 MED ORDER — METOPROLOL TARTRATE 12.5 MG HALF TABLET
12.5000 mg | ORAL_TABLET | Freq: Two times a day (BID) | ORAL | Status: DC
  Administered 2017-12-05: 12.5 mg via ORAL
  Filled 2017-12-04: qty 1

## 2017-12-04 MED ORDER — NITROGLYCERIN 0.4 MG SL SUBL: 0.4000 mg | SUBLINGUAL_TABLET | SUBLINGUAL | Status: DC | PRN

## 2017-12-04 NOTE — Progress Notes (Signed)
ANTICOAGULATION CONSULT NOTE Pharmacy Consult for Warfarin  Indication: Afib, s/p TAVR  No Known Allergies   Vital Signs: Temp: 98.1 F (36.7 C) (08/30 0745) Temp Source: Oral (08/30 0745) BP: 141/92 (08/30 0334) Pulse Rate: 74 (08/30 0334)  Labs: Recent Labs    12/03/17 1957 12/04/17 0212  HGB 15.4 15.2  HCT 46.5 46.0  PLT 245 239  LABPROT 44.8* 42.8*  INR 4.83* 4.55*  CREATININE 1.48* 1.43*    Estimated Creatinine Clearance: 72.8 mL/min (A) (by C-G formula based on SCr of 1.43 mg/dL (H)).   Medical History: Past Medical History:  Diagnosis Date  . Atrial fibrillation (HCC)    pt on Eliquis  . Chest pain   . CHF (congestive heart failure) (HCC)   . Depression   . Edema   . HTN (hypertension)   . Hyperlipidemia   . Hypothyroidism   . Neuropathy   . Obesity   . Renal insufficiency 12/04/2017  . SOB (shortness of breath)   . [facility])    Assessment: 72 y/o M on warfarin PTA for afib, also has hx of TAVR and stroke, presented to the ED with chest pain and nausea.  -INR= 4.55 with trend down, CBC stable  Goal of Therapy:  INR 2-3 Monitor platelets by anticoagulation protocol: Yes   Plan:  -No warfarin tonight -Daily PT/INR -Resume warfarin as INR allows  Harland GermanAndrew Emmalou Hunger, PharmD Clinical Pharmacist Please check Amion for pharmacy contact number

## 2017-12-04 NOTE — ED Notes (Signed)
Attempted report to 2C x 1  

## 2017-12-04 NOTE — Progress Notes (Signed)
PROGRESS NOTE    Jorge [name]  VHQ:[id number] DOB: 08/31/1945 DOA: 12/03/2017 PCP: [name], PA-C   Brief Narrative:  Jorge [name] is a 72 y.o. male with medical history significant of HTN, HLD, A. fib on Coumadin, diastolic CHF last EF 60-65% with grade 1 DD, s/p TAVR, CEA ,CVA with residual right-sided weakness, morbid obesity, and OSA on CPAP; who presents with complaints of chest pain.  Symptoms started this afternoon after waking up around 3 PM from a nap.  When he woke up he noted that he was significantly sweaty.  After getting up patient had gone to use the restroom and reported feeling very lightheaded and dizzy.   He follows with Dr. Eden Emms of cardiology.  His last cardiac cath was on 04/30/2016, with Ost CX to proximal Cx lesion with 40% stenosis.  Patient reports that he really needs to be placed back on oxygen as he can only walk short distances before coming extremely short of breath.  he was referred to medical service for admission for evaluation of chest pain.    Assessment & Plan:   Principal Problem:   Chest pain Active Problems:   Dyspnea   CAD (coronary artery disease)   HLD (hyperlipidemia)   Essential hypertension   History of cerebrovascular accident (CVA) with residual deficit   Renal insufficiency   BPH (benign prostatic hyperplasia)   Obesity, Class III, BMI 40-49.9 (morbid obesity) (HCC)   Chest pain intermittent, substernal, associated with movement and radiating to the left arm: Rule out ACS.  Initial poc troponin negative, rest of the troponins are pending. Initial EKG is negative for ischemic changes.  Echocardiogram from 2/19 showed good LVEF, with grade 1 diastolic dysfunction.  Cardiology consulted for further recommendations.  Differential include GERD vs Musculoskeletal pain.  Started him on protonix and pain meds.    OSA, dyspnea on exertion:  CXR does not show any indication of pulm edema and he does not appear fluid overloaded.    But pt reports breathing better with oxygen.  Will get PT evaluation and check ambulating oxygen levels.  Pt on CPAP at home at night and he reports being non compliant to it due to claustrophobia.    Hypertension; well controlled.    H/o CVA  Resume coumadin.    Paroxysmal Atrial fibrillation : Rate controlled. On coumadin for anti coagulation. Supra therapeutic INR.  Coumadin dosing asper pharmacy.  Rate control with amiodarone and bb.    Hyperlipidemia:  Lipid panel shows elevated LDL and triglyceride, and pt is on atorvastatin at home, suspect he is non compliant to his meds.    H/o CAD;  Last cath in 2018.  Resume aspirin, bb, lipitor.  Further recommendations as per cardiology.    Peripheral Neuropathy:  Pt is on gabapentin.  Resume the same.    Chronic diastolic heart failure:  He appears to be compensated.  Resume lasix at home dose.    Stage 3 CKD Creatinine appears to be at baseline.    Abnormal tsh:  Suspect from amiodarone use. Free t3 and free t4 levels ordered.    DVT prophylaxis: coumadin.   Code Status: full code.  Family Communication: none at bedside.  Disposition Plan: pending further evaluation by cardiology.   Consultants:   Cardiology.    Procedures: none.   Antimicrobials: none.   Subjective: Reports occasional chest pain, intermittent, currently chest pain free.  Leg pain, bilateral. Reports has neuropathy.   Objective: Vitals:   12/03/17 2330 12/04/17  0107 12/04/17 0334 12/04/17 0745  BP: 109/68 121/87 (!) 141/92   Pulse: 71 69 74   Resp: 13 17 17    Temp:  98.2 F (36.8 C) 98.2 F (36.8 C) 98.1 F (36.7 C)  TempSrc:  Oral Oral Oral  SpO2: 91%  95%   Weight:  (!) 152.1 kg    Height:  6\' 2"  (1.88 m)     No intake or output data in the 24 hours ending 12/04/17 0843 Filed Weights   12/04/17 0107  Weight: (!) 152.1 kg    Examination:  General exam: mild distress from leg pain.  Respiratory system: diminished  at bases, on 2 lit of [place] oxygen. No wheezing heard. . Cardiovascular system: S1 & S2 heard, RRR. No JVD, no pedal edema.  Gastrointestinal system: Abdomen is nondistended, soft and nontender. No organomegaly or masses felt. Normal bowel sounds heard. Central nervous system: Alert and oriented. No focal neurological deficits. Extremities: no cyanosis or clubbing. No pedal edema.  Skin: No rashes, lesions or ulcers Psychiatry:Mood & affect appropriate.     Data Reviewed: I have personally reviewed following labs and imaging studies  CBC: Recent Labs  Lab 12/03/17 1957 12/04/17 0212  WBC 9.2 9.4  NEUTROABS  --  6.5  HGB 15.4 15.2  HCT 46.5 46.0  MCV 94.5 94.7  PLT 245 239   Basic Metabolic Panel: Recent Labs  Lab 12/03/17 1957 12/04/17 0212  NA 134* 134*  K 4.1 4.0  CL 94* 94*  CO2 26 29  GLUCOSE 133* 144*  BUN 22 22  CREATININE 1.48* 1.43*  CALCIUM 8.7* 8.5*   GFR: Estimated Creatinine Clearance: 72.8 mL/min (A) (by C-G formula based on SCr of 1.43 mg/dL (H)). Liver Function Tests: No results for input(s): AST, ALT, ALKPHOS, BILITOT, PROT, ALBUMIN in the last 168 hours. No results for input(s): LIPASE, AMYLASE in the last 168 hours. No results for input(s): AMMONIA in the last 168 hours. Coagulation Profile: Recent Labs  Lab 12/03/17 1957 12/04/17 0212  INR 4.83* 4.55*   Cardiac Enzymes: No results for input(s): CKTOTAL, CKMB, CKMBINDEX, TROPONINI in the last 168 hours. BNP (last 3 results) Recent Labs    01/14/17 0852  PROBNP 79   HbA1C: No results for input(s): HGBA1C in the last 72 hours. CBG: No results for input(s): GLUCAP in the last 168 hours. Lipid Profile: Recent Labs    12/04/17 0212  CHOL 206*  HDL 44  LDLCALC 122*  TRIG 198*  CHOLHDL 4.7   Thyroid Function Tests: Recent Labs    12/04/17 0212  TSH 11.446*   Anemia Panel: No results for input(s): VITAMINB12, FOLATE, FERRITIN, TIBC, IRON, RETICCTPCT in the last 72 hours. Sepsis  Labs: No results for input(s): PROCALCITON, LATICACIDVEN in the last 168 hours.  Recent Results (from the past 240 hour(s))  MRSA PCR Screening     Status: None   Collection Time: 12/04/17  1:04 AM  Result Value Ref Range Status   MRSA by PCR NEGATIVE NEGATIVE Final    Comment:        The GeneXpert MRSA Assay (FDA approved for NASAL specimens only), is one component of a comprehensive MRSA colonization surveillance program. It is not intended to diagnose MRSA infection nor to guide or monitor treatment for MRSA infections. Performed at [facility], [address] [place], [place] [address]          Radiology Studies: Dg Chest 2 View  Result Date: 12/03/2017 CLINICAL DATA:  72 year old  male with chest pain, and shortness of breath. EXAM: CHEST - 2 VIEW COMPARISON:  Chest radiograph dated 03/01/2017 FINDINGS: There is left lung base subsegmental atelectasis. No lobar consolidation, pleural effusion, or pneumothorax. There is stable cardiomegaly. Aortic valve repair. No acute osseous pathology. IMPRESSION: 1. No acute cardiopulmonary process. 2. Stable cardiomegaly and left lung base subsegmental atelectasis. Electronically Signed   By: Elgie Collard M.D.   On: 12/03/2017 21:16        Scheduled Meds: . amiodarone  200 mg Oral Daily  . atorvastatin  40 mg Oral q1800  . buPROPion  450 mg Oral Daily  . gabapentin  400 mg Oral TID  . levothyroxine  100 mcg Oral QAC breakfast  . mouth rinse  15 mL Mouth Rinse BID  . metoprolol tartrate  12.5 mg Oral BID  . tamsulosin  0.4 mg Oral QPC supper  . traZODone  100 mg Oral QHS   Continuous Infusions:   LOS: 0 days    Time spent: 30 minutes.     Kathlen Mody, MD [facility] Pager [phone]   If 7PM-7AM, please contact night-coverage [email or web address] Password [facility] 12/04/2017, 8:43 AM

## 2017-12-04 NOTE — Progress Notes (Signed)
Transported to nuclear med for stress test awake and alert.

## 2017-12-04 NOTE — ED Notes (Signed)
Report given to Francesca, RN.

## 2017-12-04 NOTE — Progress Notes (Signed)
Patient declined CPAP at this time, states he has one at home but he remains non-compliant. No distress noted RCP will continue to follow.

## 2017-12-04 NOTE — Progress Notes (Signed)
2 day lexiscan stress test, stress portion today, resting image tomorrow.   Ramond DialSigned, Cerrone Debold PA Pager: [phone]

## 2017-12-04 NOTE — Consult Note (Addendum)
Cardiology Consultation:   Patient ID: Jorge [name]; [id number]; 1946-01-06   Admit date: 12/03/2017 Date of Consult: 12/04/2017  Primary Care Provider: [name], PA-C Primary Cardiologist: Charlton Haws, MD  Valvular specialist: Dr. Clifton James Primary Electrophysiologist:  N/A   Patient Profile:   Jorge [name] is a 72 y.o. male with a hx of PAF on coumadin, HTN, morbid obesity, OSA on CPAP, carotid artery disease s/p CEA, previous CVA, COPD, and severe s/p TAVR who is being seen today for the evaluation of chest pain at the request of Dr. Madelyn Flavors.  History of Present Illness:   Jorge [name] with PMH of PAF on coumadin, HTN, morbid obesity, OSA on CPAP, carotid artery disease s/p CEA, previous CVA, COPD, and severe s/p TAVR.  He was previously admitted to [facility] in January 2018 with syncope and found to have severe aortic stenosis.  Cardiac catheterization performed on 04/30/2016 showed 40% ostial left circumflex lesion, severe aortic stenosis.  Last echocardiogram performed on 05/28/2017 showed EF 60 to 65%, grade 1 DD, stable bioprosthetic aortic valve with mean gradient 17 mmHg, PA peak pressure 30 mmHg.  In the past few weeks, he has been noticing some worsening exertional dyspnea.  He also gained roughly 15 pounds of weight.  He has been compliant with CPAP therapy at night.  In the past 2 days, he started having intermittent substernal chest pain radiating to the right shoulder.  This comes with both with exertion and deep inspiration.  He eventually sought medical [facility].  Troponin was negative.  EKG does not show any acute changes.  Creatinine was 1.48 which is higher than a year ago.  He was given additional IV Lasix given the recent weight gain.    Past Medical History:  Diagnosis Date  . Atrial fibrillation (HCC)    pt on Eliquis  . Chest pain   . CHF (congestive heart failure) (HCC)   . Depression   . Edema   . HTN  (hypertension)   . Hyperlipidemia   . Hypothyroidism   . Neuropathy   . Obesity   . Renal insufficiency 12/04/2017  . SOB (shortness of breath)   . Stroke [facility])     Past Surgical History:  Procedure Laterality Date  . BACK SURGERY    . CARDIAC CATHETERIZATION    . CARDIAC CATHETERIZATION N/A 04/30/2016   Procedure: Right/Left Heart Cath and Coronary Angiography;  Surgeon: Kathleene Hazel, MD;  Location: [facility] INVASIVE CV LAB;  Service: Cardiovascular;  Laterality: N/A;  . CAROTID ANGIOGRAM N/A 05/30/2014   Procedure: Dorise Bullion;  Surgeon: Nada Libman, MD;  Location: [facility] CATH LAB;  Service: Cardiovascular;  Laterality: N/A;  . MULTIPLE EXTRACTIONS WITH ALVEOLOPLASTY N/A 05/02/2016   Procedure: Extraction of tooth #'s 7, 10, 23, 24, 25,and 26 with alveoloplasty.;  Surgeon: Charlynne Pander, DDS;  Location: [facility] OR;  Service: Oral Surgery;  Laterality: N/A;  . TEE WITHOUT CARDIOVERSION N/A 05/06/2016   Procedure: TRANSESOPHAGEAL ECHOCARDIOGRAM (TEE);  Surgeon: Kathleene Hazel, MD;  Location: [facility] OR;  Service: Open Heart Surgery;  Laterality: N/A;  . TRANSCATHETER AORTIC VALVE REPLACEMENT, TRANSFEMORAL N/A 05/06/2016   Procedure: TRANSCATHETER AORTIC VALVE REPLACEMENT, TRANSFEMORAL using a 23mm Edwards Sapien 3 Transcatheter Heart Valve;  Surgeon: Kathleene Hazel, MD;  Location: [facility] OR;  Service: Open Heart Surgery;  Laterality: N/A;     Home Medications:  Prior to Admission medications   Medication Sig Start Date End Date Taking?  Authorizing Provider  amiodarone (PACERONE) 200 MG tablet Take 1 tablet (200 mg total) by mouth daily. 07/31/17  Yes Wendall StadeNishan, Peter C, MD  atorvastatin (LIPITOR) 40 MG tablet Take 1 tablet (40 mg total) by mouth daily at 6 PM. 05/31/14  Yes Lorenda Hatchetothman, Adam L, MD  warfarin (COUMADIN) 5 MG tablet Take 7.5 mg by mouth daily. Take as directed by PCP    Yes [provider]  acetaminophen (TYLENOL) 500 MG tablet Take 1,000 mg by mouth daily as  needed for mild pain or headache.     [provider]  buPROPion (WELLBUTRIN XL) 150 MG 24 hr tablet Take 450 mg by mouth daily.    [provider]  DULoxetine (CYMBALTA) 60 MG capsule Take 60 mg by mouth daily. 08/28/17   [provider]  feeding supplement, ENSURE ENLIVE, (ENSURE ENLIVE) LIQD Take 237 mLs by mouth daily. Patient not taking: Reported on 12/03/2017 05/09/16   Rolly SalterPatel, Pranav M, MD  furosemide (LASIX) 80 MG tablet Take 120 mg by mouth 2 (two) times daily. PATIENT TAKES 1 AND A HALF TABLETS BY MOUTH TWICE DAILY     [provider]  gabapentin (NEURONTIN) 400 MG capsule Take 400 mg by mouth 3 (three) times daily.    [provider]  levothyroxine (SYNTHROID, LEVOTHROID) 100 MCG tablet  05/29/17   [provider]  metoprolol tartrate (LOPRESSOR) 25 MG tablet Take 12.5 mg by mouth 2 (two) times daily.    [provider]  omeprazole (PRILOSEC) 20 MG capsule Take 1 capsule (20 mg total) by mouth 2 (two) times daily before a meal. 03/01/17   Geoffery Lyonselo, Douglas, MD  omeprazole (PRILOSEC) 40 MG capsule Take 40 mg by mouth daily.    [provider]  polyethylene glycol powder (GLYCOLAX/MIRALAX) powder Take 17 g by mouth daily. 03/01/17   Geoffery Lyonselo, Douglas, MD  spironolactone (ALDACTONE) 50 MG tablet Take 50 mg by mouth daily.    [provider]  traMADol (ULTRAM) 50 MG tablet Take 50 mg by mouth every 6 (six) hours as needed for moderate pain.     [provider]  traZODone (DESYREL) 100 MG tablet Take 100 mg by mouth at bedtime.    [provider]  zolpidem (AMBIEN) 5 MG tablet Take 5 mg by mouth at bedtime as needed. 10/27/17   [provider]    Inpatient Medications: Scheduled Meds: . amiodarone  200 mg Oral Daily  . aspirin  325 mg Oral Daily  . atorvastatin  40 mg Oral q1800  . buPROPion  450 mg Oral Daily  . gabapentin  400 mg Oral TID  . levothyroxine  100 mcg Oral QAC breakfast  . mouth  rinse  15 mL Mouth Rinse BID  . [START ON 12/05/2017] metoprolol tartrate  12.5 mg Oral BID  . pantoprazole  40 mg Oral Q0600  . tamsulosin  0.4 mg Oral QPC supper  . traZODone  100 mg Oral QHS   Continuous Infusions:  PRN Meds: acetaminophen, gi cocktail, morphine injection, nitroGLYCERIN, ondansetron (ZOFRAN) IV, oxyCODONE  Allergies:   No Known Allergies  Social History:   Social History   Socioeconomic History  . Marital status: Divorced    Spouse name: Not on file  . Number of children: Not on file  . Years of education: Not on file  . Highest education level: Not on file  Occupational History  . Not on file  Social Needs  . Financial resource strain: Not on file  .  Food insecurity:    Worry: Not on file    Inability: Not on file  . Transportation needs:    Medical: Not on file    Non-medical: Not on file  Tobacco Use  . Smoking status: Former Games developer  . Smokeless tobacco: Former Neurosurgeon    Quit date: 03/22/1996  Substance and Sexual Activity  . Alcohol use: No  . Drug use: No  . Sexual activity: Not on file  Lifestyle  . Physical activity:    Days per week: Not on file    Minutes per session: Not on file  . Stress: Not on file  Relationships  . Social connections:    Talks on phone: Not on file    Gets together: Not on file    Attends religious service: Not on file    Active member of club or organization: Not on file    Attends meetings of clubs or organizations: Not on file    Relationship status: Not on file  . Intimate partner violence:    Fear of current or ex partner: Not on file    Emotionally abused: Not on file    Physically abused: Not on file    Forced sexual activity: Not on file  Other Topics Concern  . Not on file  Social History Narrative  . Not on file    Family History:    Family History  Problem Relation Age of Onset  . Heart attack Mother   . Stroke Father      ROS:  Please see the history of present illness.   All other  ROS reviewed and negative.     Physical Exam/Data:   Vitals:   12/04/17 0334 12/04/17 0725 12/04/17 0730 12/04/17 0745  BP: (!) 141/92     Pulse: 74     Resp: 17     Temp: 98.2 F (36.8 C)   98.1 F (36.7 C)  TempSrc: Oral   Oral  SpO2: 95% 97% 95%   Weight:      Height:       No intake or output data in the 24 hours ending 12/04/17 1111 Filed Weights   12/04/17 0107  Weight: (!) 152.1 kg   Body mass index is 43.05 kg/m.  General:  Well nourished, well developed, in no acute distress HEENT: normal Lymph: no adenopathy Neck: no JVD Endocrine:  No thryomegaly Vascular: No carotid bruits; FA pulses 2+ bilaterally without bruits  Cardiac:  normal S1, S2; RRR; no murmur  Lungs:  clear to auscultation bilaterally, no wheezing, rhonchi or rales  Abd: soft, nontender, no hepatomegaly  Ext: no edema Musculoskeletal:  No deformities, BUE and BLE strength normal and equal Skin: warm and dry  Neuro:  CNs 2-12 intact, no focal abnormalities noted Psych:  Normal affect   EKG:  The EKG was personally reviewed and demonstrates:  NSR with incomplete LBBB Telemetry:  Telemetry was personally reviewed and demonstrates:  NSR without significant ventricular ectopy or recurrent afib  Relevant CV Studies:  Cath 04/30/2016  Ost Cx to Prox Cx lesion, 40 %stenosed.  There is severe aortic valve stenosis.  Hemodynamic findings consistent with aortic valve stenosis.   1. Mild non-obstructive CAD 2. Severe aortic stenosis (mean gradient 33.15mmHg, AVA 1.02 cm2).   Recommendations: It is likely that his syncope is due to his aortic stenosis. I will ask the primary team to call CT surgery in the am. He may not be a good candidate for surgical AVR. Dr. Cornelius Moras  or Dr. Laneta Simmers should be asked to see him so they can discuss AVR vs TAVR. If he is not felt to be a surgical candidate, I will be glad to see him in consultation as well to discuss TAVR.    Echo 05/28/2017 LV EF: 60% -   65% Study  Conclusions  - Left ventricle: The cavity size was normal. Wall thickness was   increased in a pattern of moderate LVH. Systolic function was   normal. The estimated ejection fraction was in the range of 60%   to 65%. Wall motion was normal; there were no regional wall   motion abnormalities. Doppler parameters are consistent with   abnormal left ventricular relaxation (grade 1 diastolic   dysfunction). - Aortic valve: Bioprosthetic aortic valve s/p TAVR. The valve was   poorly visualized. Mean gradient (S): 17 mm Hg. - Mitral valve: There was no significant regurgitation. - Right ventricle: The cavity size was normal. Systolic function   was normal. - Tricuspid valve: Peak RV-RA gradient (S): 27 mm Hg. - Pulmonary arteries: PA peak pressure: 30 mm Hg (S). - Inferior vena cava: The vessel was normal in size. The   respirophasic diameter changes were in the normal range (>= 50%),   consistent with normal central venous pressure.  Impressions:  - Normal LV size with moderate LV hypertrophy. EF 60-65%. Normal RV   size and systolic function. Bioprosthetic aortic valve s/p TAVR,   mean gradient 17 mmHg. Valve was poorly visualized.   Laboratory Data:  Chemistry Recent Labs  Lab 12/03/17 1957 12/04/17 0212  NA 134* 134*  K 4.1 4.0  CL 94* 94*  CO2 26 29  GLUCOSE 133* 144*  BUN 22 22  CREATININE 1.48* 1.43*  CALCIUM 8.7* 8.5*  GFRNONAA 46* 47*  GFRAA 53* 55*  ANIONGAP 14 11    No results for input(s): PROT, ALBUMIN, AST, ALT, ALKPHOS, BILITOT in the last 168 hours. Hematology Recent Labs  Lab 12/03/17 1957 12/04/17 0212  WBC 9.2 9.4  RBC 4.92 4.86  HGB 15.4 15.2  HCT 46.5 46.0  MCV 94.5 94.7  MCH 31.3 31.3  MCHC 33.1 33.0  RDW 13.3 13.3  PLT 245 239   Cardiac Enzymes Recent Labs  Lab 12/04/17 0834  TROPONINI <0.03    Recent Labs  Lab 12/03/17 2101  TROPIPOC 0.00    BNPNo results for input(s): BNP, PROBNP in the last 168 hours.  DDimer No results  for input(s): DDIMER in the last 168 hours.  Radiology/Studies:  Dg Chest 2 View  Result Date: 12/03/2017 CLINICAL DATA:  72 year old male with chest pain, and shortness of breath. EXAM: CHEST - 2 VIEW COMPARISON:  Chest radiograph dated 03/01/2017 FINDINGS: There is left lung base subsegmental atelectasis. No lobar consolidation, pleural effusion, or pneumothorax. There is stable cardiomegaly. Aortic valve repair. No acute osseous pathology. IMPRESSION: 1. No acute cardiopulmonary process. 2. Stable cardiomegaly and left lung base subsegmental atelectasis. Electronically Signed   By: Elgie Collard M.D.   On: 12/03/2017 21:16    Assessment and Plan:   1. Chest pain: Has both typical and atypical features.  Chest pain worse both with exertion and a deep inspiration.  Recommend 2-day Myoview and echocardiogram. Hard to assess volume status, given 80mg  IV lasix, will see response. Does not have significant LE edema or crackles in the lung on exam  2. H/o severe AS s/p TAVR: obtain Echo  3. PAF on coumadin: Maintaining sinus rhythm. supratherapeutic INR  4. HTN: BP  stable.   5. morbid obesity: weight gain of 15 lbs recently, however he does not appears to be significantly volume overloaded on exam  6. OSA on CPAP  7. carotid artery disease s/p CEA  8. previous CVA: no recurrence  9. COPD  10. Hypothyroidism: free T4 normal, TSH high   For questions or updates, please contact [facility] Please consult [email or web address] for contact info under Cardiology/STEMI.   Ramond Dial, PA  12/04/2017 11:11 AM   I saw and evaluated this patient along with Azalee Course, PA-C in consultation for chest discomfort and dyspnea.  Patient is morbidly obese with history of TAVR (noted to have normal coronary arteries at that time).  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  He presents with somewhat atypical symptoms of chest discomfort and dyspnea.  I agree that with  recent normal coronary Arteries on catheterization, I am more inclined to evaluate with noninvasive testing.  He is scheduled for a 2-day Myoview.  Also need to reevaluate his valve status post TAVR to ensure no valvular issues or any worsening stenosis.  Question if some of this could be heart failure related.  Exam is relatively unhelpful given his body habitus and therefore we will await results of studies to help determine further treatment.   Bryan Lemma, MD

## 2017-12-04 NOTE — Progress Notes (Signed)
  Echocardiogram 2D Echocardiogram has been performed.  Zailynn Brandel T Pearson Reasons 12/04/2017, 3:43 PM

## 2017-12-05 ENCOUNTER — Observation Stay (HOSPITAL_COMMUNITY): Payer: Medicare Other

## 2017-12-05 DIAGNOSIS — E7849 Other hyperlipidemia: Secondary | ICD-10-CM | POA: Diagnosis not present

## 2017-12-05 DIAGNOSIS — I480 Paroxysmal atrial fibrillation: Secondary | ICD-10-CM | POA: Diagnosis not present

## 2017-12-05 DIAGNOSIS — N401 Enlarged prostate with lower urinary tract symptoms: Secondary | ICD-10-CM

## 2017-12-05 DIAGNOSIS — R0789 Other chest pain: Secondary | ICD-10-CM

## 2017-12-05 LAB — NM MYOCAR MULTI W/SPECT W/WALL MOTION / EF
CHL CUP MPHR: 148 {beats}/min
CSEPEW: 1 METS
CSEPHR: 63 %
CSEPPHR: 94 {beats}/min
Rest HR: 74 {beats}/min

## 2017-12-05 LAB — T3, FREE: T3, Free: 3.3 pg/mL (ref 2.0–4.4)

## 2017-12-05 LAB — BASIC METABOLIC PANEL
Anion gap: 10 (ref 5–15)
BUN: 23 mg/dL (ref 8–23)
CO2: 31 mmol/L (ref 22–32)
CREATININE: 1.39 mg/dL — AB (ref 0.61–1.24)
Calcium: 8.3 mg/dL — ABNORMAL LOW (ref 8.9–10.3)
Chloride: 94 mmol/L — ABNORMAL LOW (ref 98–111)
GFR calc Af Amer: 57 mL/min — ABNORMAL LOW (ref >60)
GFR, EST NON AFRICAN AMERICAN: 49 mL/min — AB (ref >60)
Glucose, Bld: 173 mg/dL — ABNORMAL HIGH (ref 70–99)
Potassium: 4.3 mmol/L (ref 3.5–5.1)
Sodium: 135 mmol/L (ref 135–145)

## 2017-12-05 LAB — PROTIME-INR
INR: 4.25
PROTHROMBIN TIME: 40.6 s — AB (ref 11.4–15.2)

## 2017-12-05 MED ORDER — NITROGLYCERIN 0.4 MG SL SUBL: 0.4000 mg | SUBLINGUAL_TABLET | SUBLINGUAL | 1 refills | Status: AC | PRN

## 2017-12-05 MED ORDER — TECHNETIUM TC 99M TETROFOSMIN IV KIT
30.0000 | PACK | Freq: Once | INTRAVENOUS | Status: AC | PRN
  Administered 2017-12-05: 30 via INTRAVENOUS

## 2017-12-05 MED ORDER — DULOXETINE HCL 60 MG PO CPEP: 60.0000 mg | ORAL_CAPSULE | Freq: Every day | ORAL | Status: DC

## 2017-12-05 MED ORDER — ACETAMINOPHEN 325 MG PO TABS: 650.0000 mg | ORAL_TABLET | ORAL | Status: DC | PRN

## 2017-12-05 MED ORDER — WARFARIN SODIUM 5 MG PO TABS: 7.5000 mg | ORAL_TABLET | Freq: Every day | ORAL | Status: DC

## 2017-12-05 NOTE — Progress Notes (Signed)
ANTICOAGULATION CONSULT NOTE Pharmacy Consult for Warfarin  Indication: Afib, s/p TAVR  No Known Allergies   Vital Signs: Temp: 98.8 F (37.1 C) (08/31 0743) Temp Source: Oral (08/31 0743) BP: 107/61 (08/31 0743) Pulse Rate: 79 (08/31 0743)  Labs: Recent Labs    12/03/17 1957 12/04/17 0212 12/04/17 0834 12/04/17 1334 12/05/17 0300  HGB 15.4 15.2  --   --   --   HCT 46.5 46.0  --   --   --   PLT 245 239  --   --   --   LABPROT 44.8* 42.8*  --   --  40.6*  INR 4.83* 4.55*  --   --  4.25*  CREATININE 1.48* 1.43*  --   --   --   TROPONINI  --   --  <0.03 <0.03  --     Estimated Creatinine Clearance: 72.6 mL/min (A) (by C-G formula based on SCr of 1.43 mg/dL (H)).   Medical History: Past Medical History:  Diagnosis Date  . Atrial fibrillation (HCC)    pt on Eliquis  . Chest pain   . CHF (congestive heart failure) (HCC)   . Depression   . Edema   . HTN (hypertension)   . Hyperlipidemia   . Hypothyroidism   . Neuropathy   . Obesity   . Renal insufficiency 12/04/2017  . SOB (shortness of breath)   . Stroke [facility](HCC)    Assessment: 72 y/o M on warfarin PTA for afib, also has hx of TAVR and stroke, presented to the ED with chest pain and nausea.  -INR= 4.25 with slight trend down, CBC has been stable.   Goal of Therapy:  INR 2-3 Monitor platelets by anticoagulation protocol: Yes   Plan:  -No warfarin tonight -Daily PT/INR -Resume warfarin as INR allows  Sheppard CoilFrank Melena Hayes PharmD., BCPS Clinical Pharmacist 12/05/2017 9:21 AM

## 2017-12-05 NOTE — Evaluation (Signed)
Physical Therapy Evaluation Patient Details Name: Jorge [name] MRN: [id number] DOB: 1945-10-25 Today's Date: 12/05/2017   History of Present Illness  Pt is a 72 y.o. male admitted 12/03/17 with c/o worsening DOE and chest pain. S/p stress test 8/30. PMH includes CAD, severe AS s/p TAVR, HTN, PAF, OSA, COPD, obesity, CVA w/ residual R-side weakness.    Clinical Impression  Pt presents with an overall decrease in functional mobility secondary to above. PTA, pt lives alone at independent living apartment; mod indep with [facility]. Today, pt ambulatory at supervision-level. SpO2 >88% on 2L O2 [place]. Limited by decreased activity tolerance and DOE. Educ on energy conservation and pursed lip breathing. Pt would benefit from continued acute PT services to maximize functional mobility and independence prior to d/c home.     Follow Up Recommendations No PT follow up    Equipment Recommendations  None recommended by PT    Recommendations for Other Services       Precautions / Restrictions Precautions Precautions: Fall Restrictions Weight Bearing Restrictions: No      Mobility  Bed Mobility Overal bed mobility: Independent                Transfers Overall transfer level: Needs assistance Equipment used: None Transfers: Sit to/from Stand Sit to Stand: Supervision            Ambulation/Gait Ambulation/Gait assistance: Supervision Gait Distance (Feet): 150 Feet Assistive device: None Gait Pattern/deviations: Step-through pattern;Decreased stride length;Wide base of support Gait velocity: Decreased Gait velocity interpretation: 1.31 - 2.62 ft/sec, indicative of limited community ambulator General Gait Details: DOE 2/4 with ambulation; 1x standing rest break. SpO2 down to 88% on 2L O2 [place]  Stairs            Wheelchair Mobility    Modified Rankin (Stroke Patients Only)       Balance Overall balance assessment: Needs assistance   Sitting balance-Leahy Scale: Good        Standing balance-Leahy Scale: Good                               Pertinent Vitals/Pain Pain Assessment: No/denies pain    Home Living Family/patient expects to be discharged to:: Private residence Living Arrangements: Alone Available Help at Discharge: Family;Available PRN/intermittently Type of Home: Independent living facility Home Access: Level entry     Home Layout: One level Home Equipment: Gilmer Mor - single point Additional Comments: Lives in independent living apartment    Prior Function Level of Independence: Independent with assistive device(s)         Comments: Uses SPC when ambulating outside. Meals on Wheels provides 1 meal/day     Hand Dominance        Extremity/Trunk Assessment        Lower Extremity Assessment Lower Extremity Assessment: RLE deficits/detail RLE Deficits / Details: Residual weakness from CVA       Communication   Communication: No difficulties  Cognition Arousal/Alertness: Awake/alert Behavior During Therapy: WFL for tasks assessed/performed Overall Cognitive Status: Within Functional Limits for tasks assessed                                        General Comments      Exercises     Assessment/Plan    PT Assessment Patient needs continued PT services  PT Problem List Decreased activity  tolerance;Decreased balance;Decreased mobility;Cardiopulmonary status limiting activity       PT Treatment Interventions DME instruction;Gait training;Stair training;Functional mobility training;Therapeutic activities;Therapeutic exercise;Balance training;Patient/family education    PT Goals (Current goals can be found in the Care Plan section)  Acute Rehab PT Goals Patient Stated Goal: Return home to see dog PT Goal Formulation: With patient Time For Goal Achievement: 12/19/17 Potential to Achieve Goals: Good    Frequency Min 3X/week   Barriers to discharge Decreased caregiver support       Co-evaluation               AM-PAC PT "6 Clicks" Daily Activity  Outcome Measure Difficulty turning over in bed (including adjusting bedclothes, sheets and blankets)?: None Difficulty moving from lying on back to sitting on the side of the bed? : None Difficulty sitting down on and standing up from a chair with arms (e.g., wheelchair, bedside commode, etc,.)?: None Help needed moving to and from a bed to chair (including a wheelchair)?: A Little Help needed walking in hospital room?: A Little Help needed climbing 3-5 steps with a railing? : A Little 6 Click Score: 21    End of Session Equipment Utilized During Treatment: Gait belt;Oxygen Activity Tolerance: Patient tolerated treatment well Patient left: in bed;with call bell/phone within reach Nurse Communication: Mobility status PT Visit Diagnosis: Other abnormalities of gait and mobility (R26.89)    Time: [id number] PT Time Calculation (min) (ACUTE ONLY): 21 min   Charges:   PT Evaluation $PT Eval Moderate Complexity: 1 Mod         Ina HomesJaclyn Madelaine Whipple, PT, DPT Acute Rehabilitation Services  Pager [phone] Office [phone]  Malachy ChamberJaclyn L Arma Reining 12/05/2017, 3:15 PM

## 2017-12-05 NOTE — Progress Notes (Signed)
   Progress Note  Patient Name: Jorge DroughtGeorge O [name] Date of Encounter: 12/05/2017  Primary Cardiologist: Dr. Mosie EpsteinPeter Nishan  Lexiscan Myoview completed today and reassuring as noted:  IMPRESSION: 1. No reversible ischemia or infarction.  2. Normal left ventricular wall motion.  3. Left ventricular ejection fraction 64%  4. Non invasive risk stratification*: Low  With stable TAVR function, normal LVEF, and no significant ischemia no further inpatient cardiac testing is planned. Would keep regular outpatient follow-up. Call with further questions.  Signed, Nona DellSamuel McDowell, MD  12/05/2017, 3:53 PM

## 2017-12-05 NOTE — Care Management Note (Signed)
Case Management Note  Patient Details  Name: Jorge [name] MRN: [id number] Date of Birth: October 16, 1945  Subjective/Objective:                    Action/Plan:  [facility]. Portable O2 tank for transportation home will be delivered to room prior to DC, this may take a few hours as it is coming outpatient referral center. No other CM needs identified.  Expected Discharge Date:  12/05/17               Expected Discharge Plan:  Home w Home Health Services  In-House Referral:     Discharge planning Services  CM Consult  Post Acute Care Choice:  Home Health, Durable Medical Equipment Choice offered to:  Patient  DME Arranged:  Oxygen DME Agency:  [facility]  HH Arranged:  [facility], [facility]  [facility]  Status of Service:  Completed, signed off  If discussed at Long Length of Stay Meetings, dates discussed:    Additional Comments:  Lawerance SabalDebbie Laymond Postle, RN 12/05/2017, 5:12 PM

## 2017-12-05 NOTE — Progress Notes (Signed)
Discharge instructions given. Pt verbalized understanding and all questions were answered.  

## 2017-12-05 NOTE — Progress Notes (Signed)
PT Cancellation Note  Patient Details Name: Jorge [name] MRN: [id number] DOB: Sep 07, 1945   Cancelled Treatment:    Reason Eval/Treat Not Completed: Patient at procedure or test/unavailable. Will follow-up for PT evaluation as schedule permits.  Ina HomesJaclyn Mischele Detter, PT, DPT Acute Rehabilitation Services  Pager [phone] Office [phone]  Malachy ChamberJaclyn L Samuel Rittenhouse 12/05/2017, 12:16 PM

## 2017-12-05 NOTE — Progress Notes (Signed)
SATURATION QUALIFICATIONS:   Patient Saturations on Room Air at Rest = 90% Patient Saturations on Room Air while Ambulating = 86% Patient Saturations on 2 Liters of oxygen while Ambulating = 94%    

## 2017-12-05 NOTE — Care Management Obs Status (Signed)
MEDICARE OBSERVATION STATUS NOTIFICATION   Patient Details  Name: Jorge [name] MRN: [id number] Date of Birth: 04-14-1945   Medicare Observation Status Notification Given:  Yes    Lawerance Sabalebbie Theressa Piedra, RN 12/05/2017, 11:03 AM

## 2017-12-05 NOTE — Progress Notes (Addendum)
   Progress Note  Patient Name: Jorge DroughtGeorge O Musco Date of Encounter: 12/05/2017  Primary Cardiologist: Dr. Charlton HawsPeter Nishan  Please refer to cardiology consultation note from yesterday.  Follow-up echocardiogram has been obtained with LVEF 60 to 65%, mild diastolic dysfunction, stable TAVR prosthesis without perivalvular leak and normal gradients.    Vital signs reviewed as well as telemetry.  Patient remains in sinus rhythm.  Blood pressure stable.  Troponin I levels are negative for ACS.  Patient is currently undergoing 2-day protocol Lexiscan Myoview with rest images being obtained today.  Plan to follow-up on complete study results when available.  Signed, Nona DellSamuel Aleksa Catterton, MD  12/05/2017, 9:25 AM

## 2017-12-08 NOTE — Discharge Summary (Signed)
Physician Discharge Summary  Jorge [name] UJW:[id number] DOB: 08-15-1945 DOA: 12/03/2017  PCP: Jorge Peak, PA-C  Admit date: 12/03/2017 Discharge date: 12/05/2017  Admitted From:[facility].  Disposition:  Home.   Recommendations for Outpatient Follow-up:  1. Follow up with PCP in 1-2 weeks 2. Please obtain BMP/CBC in one week Please follow up with cardiology as recommended.   Discharge Condition:stable.  CODE STATUS: full code.  Diet recommendation: Heart Healthy  Brief/Interim Summary: Jorge [name] a 72 y.o.malewith medical history significant ofHTN, HLD, A. fib on Coumadin, diastolic CHF last EF 60-65% with grade 1 DD, s/p TAVR, CEA,CVA with residual right-sided weakness, morbid obesity,andOSA on CPAP;who presents with complaints of chest pain. Symptoms started this afternoon after waking up around 3 PM from a nap. When he woke up he noted that he was significantly sweaty. After getting up patient had gone to use the restroom and reported feeling very lightheaded and dizzy. He follows with Dr. York [name] cardiology. His last cardiac cath was on 04/30/2016,with Ost CXto proximal Cxlesion with 40% stenosis.Patient reports that he really needs to be placed back on oxygen as he can only walk short distances before coming extremely short of breath.he was referred to medical service for admission for evaluation of chest pain.  Discharge Diagnoses:  Principal Problem:   Atypical chest pain Active Problems:   Dyspnea   CAD (coronary artery disease)   HLD (hyperlipidemia)   Essential hypertension   Paroxysmal atrial fibrillation (HCC)   HTN (hypertension)   Mixed hyperlipidemia   History of cerebrovascular accident (CVA) with residual deficit   Renal insufficiency   BPH (benign prostatic hyperplasia)   Obesity, Class III, BMI 40-49.9 (morbid obesity) (HCC)  Chest pain intermittent, substernal, associated with movement and radiating to the left arm: Rule out ACS.   Initial poc troponin negative,.Initial EKG is negative for ischemic changes.  Echocardiogram from 2/19 showed good LVEF, with grade 1 diastolic dysfunction.  Cardiology consulted for further recommendations.  Differential include GERD vs Musculoskeletal pain.  Started him on protonix and pain meds.  Stress test is low risk.  Recommend outpatient follow up with cardiology as needed.     OSA, dyspnea on exertion:  CXR does not show any indication of pulm edema and he does not appear fluid overloaded.  But pt reports breathing better with oxygen.  Pt on CPAP at home at night and he reports being non compliant to it due to claustrophobia.    Hypertension; well controlled.    H/o CVA  Resume coumadin.    Paroxysmal Atrial fibrillation : Rate controlled. On coumadin for anti coagulation. Supra therapeutic INR.  Coumadin dosing asper pharmacy.  Rate control with amiodarone and bb.  Hold coumadin for 2 days and check INR on Monday to Tuesday and dose coumadin as per INR.    Hyperlipidemia:  Lipid panel shows elevated LDL and triglyceride, and pt is on atorvastatin at home, suspect he is non compliant to his meds. Pt reports he hasn't been taking statin, recommend continue the same dose and follow up with lipid panel in 4 weeks.    H/o CAD;  Last cath in 2018.  Resume aspirin, bb, lipitor.  Further recommendations as per cardiology.    Peripheral Neuropathy:  Pt is on gabapentin.  Resume the same.    Chronic diastolic heart failure:  He appears to be compensated.  Resume lasix at home dose.    Stage 3 CKD Creatinine appears to be at baseline.    Abnormal tsh:  Suspect from amiodarone use. Free t3 and free t4 levels wnl  Discharge Instructions  Discharge Instructions    Diet - low sodium heart healthy   Complete by:  As directed    Discharge instructions   Complete by:  As directed    Please follow up with cardiology as recommended.   Please use oxygen on ambulation.  Please hold your coumadin , today and tomorrow, check INR on Monday at PCP office or coumadin clinic and if INR is between 2 to 3, then start the coumadin.   Increase activity slowly   Complete by:  As directed      Allergies as of 12/05/2017   No Known Allergies     Medication List    STOP taking these medications   feeding supplement (ENSURE ENLIVE) Liqd     TAKE these medications   acetaminophen 325 MG tablet Commonly known as:  TYLENOL Take 2 tablets (650 mg total) by mouth every 4 (four) hours as needed for headache or mild pain. What changed:    medication strength  how much to take  when to take this   amiodarone 200 MG tablet Commonly known as:  PACERONE Take 1 tablet (200 mg total) by mouth daily.   atorvastatin 40 MG tablet Commonly known as:  LIPITOR Take 1 tablet (40 mg total) by mouth daily at 6 PM. What changed:  when to take this   bisacodyl 5 MG EC tablet Commonly known as:  DULCOLAX Take 5 mg by mouth daily as needed for moderate constipation.   DULoxetine 60 MG capsule Commonly known as:  CYMBALTA Take 60 mg by mouth daily.   fluticasone 50 MCG/ACT nasal spray Commonly known as:  FLONASE Place 1 spray into both nostrils daily as needed for allergies.   furosemide 80 MG tablet Commonly known as:  LASIX Take 120 mg by mouth 2 (two) times daily.   gabapentin 400 MG capsule Commonly known as:  NEURONTIN Take 800 mg by mouth at bedtime.   levothyroxine 100 MCG tablet Commonly known as:  SYNTHROID, LEVOTHROID Take 100 mcg by mouth daily.   metoprolol tartrate 25 MG tablet Commonly known as:  LOPRESSOR Take 25 mg by mouth 2 (two) times daily.   nitroGLYCERIN 0.4 MG SL tablet Commonly known as:  NITROSTAT Place 1 tablet (0.4 mg total) under the tongue every 5 (five) minutes as needed for chest pain.   omeprazole 40 MG capsule Commonly known as:  PRILOSEC Take 40 mg by mouth daily. What changed:   Another medication with the same name was removed. Continue taking this medication, and follow the directions you see here.   polyethylene glycol powder powder Commonly known as:  GLYCOLAX/MIRALAX Take 17 g by mouth daily. What changed:    when to take this  reasons to take this   spironolactone 50 MG tablet Commonly known as:  ALDACTONE Take 50 mg by mouth daily.   traMADol 50 MG tablet Commonly known as:  ULTRAM Take 100 mg by mouth 3 (three) times daily as needed for moderate pain.   warfarin 5 MG tablet Commonly known as:  COUMADIN Take 1.5 tablets (7.5 mg total) by mouth daily. Take as directed by PCP   zolpidem 5 MG tablet Commonly known as:  AMBIEN Take 5 mg by mouth at bedtime.      Follow-up Information    Jorge Peak, PA-C. Schedule an appointment as soon as possible for a visit in 1 week(s).   Specialty:  Physician  Field seismologist information: [address] [place] [address] [phone]        Wendall Stade, MD .   Specialty:  Cardiology Contact information: [address] [place] [address] [place] [address] [phone]        [facility] - Dme Follow up.   Why:  for home oxygen. they will deliver portable tank to your hospital room prior to DC and concentrator to your home later today. Contact information: [address] [place] [address] [phone]        [facility], [facility]-Home Follow up.   Specialty:  Home Health Services Why:  For home health RN and HHA. They will contact you in 1-2 days to set up your first home appointment Contact information: [address] [place] [address] [phone]          No Known Allergies  Consultations:  cardiology   Procedures/Studies: Dg Chest 2 View  Result Date: 12/03/2017 CLINICAL DATA:  72 year old male with chest pain, and shortness of breath. EXAM: CHEST - 2 VIEW COMPARISON:  Chest radiograph dated 03/01/2017 FINDINGS: There is  left lung base subsegmental atelectasis. No lobar consolidation, pleural effusion, or pneumothorax. There is stable cardiomegaly. Aortic valve repair. No acute osseous pathology. IMPRESSION: 1. No acute cardiopulmonary process. 2. Stable cardiomegaly and left lung base subsegmental atelectasis. Electronically Signed   By: Elgie Collard M.D.   On: 12/03/2017 21:16   Nm Myocar Multi W/spect W/wall Motion / Ef  Result Date: 12/05/2017 CLINICAL DATA:  72 year old male with chest pain.  Palpitations. EXAM: MYOCARDIAL IMAGING WITH SPECT (REST AND PHARMACOLOGIC-STRESS) GATED LEFT VENTRICULAR WALL MOTION STUDY LEFT VENTRICULAR EJECTION FRACTION TECHNIQUE: Standard myocardial SPECT imaging was performed after resting intravenous injection of 10 mCi Tc-37m tetrofosmin. Subsequently, intravenous infusion of Lexiscan was performed under the supervision of the Cardiology staff. At [name] effect of the drug, 30 mCi Tc-69m tetrofosmin was injected intravenously and standard myocardial SPECT imaging was performed. Quantitative gated imaging was also performed to evaluate left ventricular wall motion, and estimate left ventricular ejection fraction. COMPARISON:  None. FINDINGS: Perfusion: No decreased activity in the left ventricle on stress imaging to suggest reversible ischemia or infarction. Wall Motion: Normal left ventricular wall motion. No left ventricular dilation. Left Ventricular Ejection Fraction: 64 % End diastolic volume 102 ml End systolic volume 36 ml IMPRESSION: 1. No reversible ischemia or infarction. 2. Normal left ventricular wall motion. 3. Left ventricular ejection fraction 64% 4. Non invasive risk stratification*: Low *2012 Appropriate Use Criteria for Coronary Revascularization Focused Update: J Am Coll Cardiol. 2012;59(9):857-881. http://content.dementiazones.com.aspx?articleid=[email or web address] These results will be called to the ordering clinician or representative by the Radiologist Assistant, and  communication documented in the PACS or zVision Dashboard. Electronically Signed   By: Genevive Bi M.D.   On: 12/05/2017 11:18      Subjective: No new complaints.   Discharge Exam: Vitals:   12/05/17 0743 12/05/17 1119  BP: 107/61 107/86  Pulse: 79 75  Resp: 15 18  Temp: 98.8 F (37.1 C) 98.8 F (37.1 C)  SpO2: 96% 95%   Vitals:   12/05/17 0406 12/05/17 0500 12/05/17 0743 12/05/17 1119  BP: 102/81  107/61 107/86  Pulse: 86  79 75  Resp: 15  15 18   Temp: 97.7 F (36.5 C)  98.8 F (37.1 C) 98.8 F (37.1 C)  TempSrc: Oral  Oral Oral  SpO2: 95%  96% 95%  Weight:  (!) 151.8 kg    Height:  General: Pt is alert, awake, not in acute distress Cardiovascular: RRR, S1/S2 +, no rubs, no gallops Respiratory: CTA bilaterally, no wheezing, no rhonchi Abdominal: Soft, NT, ND, bowel sounds + Extremities: no edema, no cyanosis    The results of significant diagnostics from this hospitalization (including imaging, microbiology, ancillary and laboratory) are listed below for reference.     Microbiology: Recent Results (from the past 240 hour(s))  MRSA PCR Screening     Status: None   Collection Time: 12/04/17  1:04 AM  Result Value Ref Range Status   MRSA by PCR NEGATIVE NEGATIVE Final    Comment:        The GeneXpert MRSA Assay (FDA approved for NASAL specimens only), is one component of a comprehensive MRSA colonization surveillance program. It is not intended to diagnose MRSA infection nor to guide or monitor treatment for MRSA infections. Performed at [facility], [address], [place], [place] [address]      Labs: BNP (last 3 results) Recent Labs    03/01/17 0439  BNP 21.9   Basic Metabolic Panel: Recent Labs  Lab 12/03/17 1957 12/04/17 0212 12/05/17 1030  NA 134* 134* 135  K 4.1 4.0 4.3  CL 94* 94* 94*  CO2 26 29 31   GLUCOSE 133* 144* 173*  BUN 22 22 23   CREATININE 1.48* 1.43* 1.39*  CALCIUM 8.7* 8.5* 8.3*   Liver Function  Tests: No results for input(s): AST, ALT, ALKPHOS, BILITOT, PROT, ALBUMIN in the last 168 hours. No results for input(s): LIPASE, AMYLASE in the last 168 hours. No results for input(s): AMMONIA in the last 168 hours. CBC: Recent Labs  Lab 12/03/17 1957 12/04/17 0212  WBC 9.2 9.4  NEUTROABS  --  6.5  HGB 15.4 15.2  HCT 46.5 46.0  MCV 94.5 94.7  PLT 245 239   Cardiac Enzymes: Recent Labs  Lab 12/04/17 0834 12/04/17 1334  TROPONINI <0.03 <0.03   BNP: Invalid input(s): POCBNP CBG: No results for input(s): GLUCAP in the last 168 hours. D-Dimer No results for input(s): DDIMER in the last 72 hours. Hgb A1c No results for input(s): HGBA1C in the last 72 hours. Lipid Profile No results for input(s): CHOL, HDL, LDLCALC, TRIG, CHOLHDL, LDLDIRECT in the last 72 hours. Thyroid function studies No results for input(s): TSH, T4TOTAL, T3FREE, THYROIDAB in the last 72 hours.  Invalid input(s): FREET3 Anemia work up No results for input(s): VITAMINB12, FOLATE, FERRITIN, TIBC, IRON, RETICCTPCT in the last 72 hours. Urinalysis    Component Value Date/Time   COLORURINE STRAW (A) 12/03/2017 2337   APPEARANCEUR CLEAR 12/03/2017 2337   LABSPEC 1.006 12/03/2017 2337   PHURINE 7.0 12/03/2017 2337   GLUCOSEU NEGATIVE 12/03/2017 2337   HGBUR SMALL (A) 12/03/2017 2337   BILIRUBINUR NEGATIVE 12/03/2017 2337   KETONESUR NEGATIVE 12/03/2017 2337   PROTEINUR NEGATIVE 12/03/2017 2337   NITRITE NEGATIVE 12/03/2017 2337   LEUKOCYTESUR NEGATIVE 12/03/2017 2337   Sepsis Labs Invalid input(s): PROCALCITONIN,  WBC,  LACTICIDVEN Microbiology Recent Results (from the past 240 hour(s))  MRSA PCR Screening     Status: None   Collection Time: 12/04/17  1:04 AM  Result Value Ref Range Status   MRSA by PCR NEGATIVE NEGATIVE Final    Comment:        The GeneXpert MRSA Assay (FDA approved for NASAL specimens only), is one component of a comprehensive MRSA colonization surveillance program. It is  not intended to diagnose MRSA infection nor to guide or monitor treatment for MRSA  infections. Performed at [facility], [address] [place], [place] [address]      Time coordinating discharge: 31  minutes  SIGNED:   Kathlen Mody, MD  [facility] 12/08/2017, 8:03 AM Pager   If 7PM-7AM, please contact night-coverage [email or web address] Password [email or web address]

## 2017-12-09 ENCOUNTER — Ambulatory Visit: Payer: Medicare Other | Admitting: Neurology

## 2017-12-09 ENCOUNTER — Encounter: Payer: Self-pay | Admitting: Neurology

## 2017-12-09 VITALS — BP 118/67 | HR 65 | Ht 74.0 in | Wt 339.0 lb

## 2017-12-09 DIAGNOSIS — G4733 Obstructive sleep apnea (adult) (pediatric): Secondary | ICD-10-CM | POA: Diagnosis not present

## 2017-12-09 DIAGNOSIS — I5033 Acute on chronic diastolic (congestive) heart failure: Secondary | ICD-10-CM | POA: Diagnosis not present

## 2017-12-09 DIAGNOSIS — G4734 Idiopathic sleep related nonobstructive alveolar hypoventilation: Secondary | ICD-10-CM

## 2017-12-09 DIAGNOSIS — R0601 Orthopnea: Secondary | ICD-10-CM | POA: Diagnosis not present

## 2017-12-09 DIAGNOSIS — Z9989 Dependence on other enabling machines and devices: Secondary | ICD-10-CM

## 2017-12-09 NOTE — Progress Notes (Signed)
SLEEP MEDICINE CLINIC   Provider:  Melvyn Novas, M D  Primary Care Physician:  [name], PA-C   Referring Provider: [name], PA-C    Chief Complaint  Patient presents with  . Follow-up    pt alone, rm 10. pt states was just recently in the hospital for having chest pains. pt states that the MD at [facility] placed him on the oxygen in the hospital. CPAP working well other then he doesnt like the full face mask.     HPI:  Jorge [name] is a 72 y.o. male patient of PA [name], and seen here on 12-09-2017 in a follow up.  [name] underwent a sleep evaluation and during his consultation it was clear that he had orthopnea, shortness of breath and some anginal symptoms which to 4 months earlier had been worked up by cardiology and were found to be noncardiac in origin.  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  The patient slept 123 minutes in the first half of the split-night protocol and was diagnosed with an AHI of 21/h no REM sleep was noted, all sleep was supine with a slight incline as the patient is orthopneic.  The time below 89% saturation equal 170 minutes and he had an end-tidal CO2 at the highest 48.8 torr suspicious of hypercapnia.  The patient was placed on a fissure and pico full facemask the model was Simplus and medium size, he was titrated beginning at 5 and ending at 15 cmH2O where his nadir rose to 91% SPO2.  No additional oxygen was given during the night as the hypoxemia improved drastically under CPAP.  The nadir of oxygen at his last set pressure, namely 15 cmH2O CPAP, was 91% SPO2.  He was prescribed an auto titration capable CPAP machine which she has compliantly used  except for the days during which he was hospitalized.  His CPAP compliance is 100% by days and 25 of those days over 4 hours at night with an average user time of 5 hours 46 minutes.  CPAP has been set to 15 cmH2O was 2 cm EPR, residual AHI is 3.0 he does have some air leakage, there was no Cheyne-Stokes respiration noted noted.  On 03 December 2017 the patient was admitted to hospital he complained of chest pain a serious tightness and pressure sensation on his chest, starting in the afternoon at 3 PM after waking from a nap.  Chest x-ray was obtained but did not show pulmonary edema, patient was placed on CPAP at home but she reported at the time of the hospital admission that he was noncompliant due to claustrophobia.  Paroxysmal atrial fibrillation was confirmed rate control with amiodarone and beta-blocker, hyperlipidemia was confirmed to a degree that beta doubtful that atorvastatin was actually in the system.  The diagnosis was that of chronic diastolic heart failure decompensated starting Lasix, stage III chronic kidney disease, abnormal TSH likely related to amiodarone.  He has been given oxygen during his hospitalization and reported to Dr. Charlton Haws and cardiology that he felt better while using it.   Dr. Eden Emms ordered the portable oxygen to the hospital room prior to discharge and also stated that he will set up a concentrator at home later.  Patient's oxygen concentrator  should be bled into his CPAP. [facility] to show him how. I need not to write for any medical approval as order was through cardiology.       He was seen here on 07-02-2017  in a referral from PA  Jorge [name] as a new patient to the SLEEP CLINIC. Jorge [name] is audibly wheezing and short of breath , morbidly obese. He reports having difficulties sleeping. He has memory trouble since a stroke and had a seizure in 2000.He indicated that his sleep is very limited at night and that he only gets 2-3 hours each night with great difficulties.  He sleeps sitting or on 4 pillows, is orthopnoeic.   He reports numbness weakness, nocturia, fatigue, restless legs and that he is known to snore and have apnea.  Carries a diagnosis of depression, heart disease ( CHF ??) , atrial fibrillation, hypertension morbid obesity, his last cardiac workup was on 01 March 2017 for shortness of breath, and was unremarkable.   He does have GERD which may also trigger some shortness of breath or nocturnal apnea.  He has chronic constipation, was diagnosed with sleep apnea probably 15 years ago or longer but has not used CPAP in many many years but she could not tolerate he states.  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  Hypothyroidism has been followed by primary care, his gait instability following his back surgery, chronic back pain and extremity pain has been followed by primary care( by [name] his primary care provider). 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   He was advised that there is no chronic back pain therapy or pain management in this practice.  Chief complaint according to patient : "I just can't sleep "  Sleep habits are as follows: The patient's bedtime is more or less around 10:30 PM.  Until then he watches TV, in his living room. Takes the dog for a walk.  Most nights he is asleep twin 1 and 2 AM, after a long delay.  By 3 AM he is up for a bathroom break, he may go back to sleep and wakes up an hour later again.  This goes on throughout the night. Sleep son 4 pillows.    It is pain that wakes him between 330 and 4 AM and usually he will leave the bed them knowing that he can no longer sleep any longer.  This is not a primary organic sleep disorder. He has  less back pain I when sitting in a chair and naps in the chair- 2-3 times a day. He gets 4 hours of sleep max at night , and additional 1.5 hours in daytime.  I will address the obstructive sleep apnea that the patient had been diagnosed with in the past and see if CPAP can help him to sleep longer or reduce the number of bathroom breaks.  Sleep medical history and family sleep history:  Last sleep study followed a stroke. HTN , CVA , Seizure, CHF, edema, neuropathy with edema. Back pain from failed back surgery. Only child , parents had no diagnosed sleep disorder, father snored and acted out dreams. chronic insomnia due to chronic pain, orthopnea, and anxiety/ depression.   Social history:  3 sons. Divorced, lives alone with a  dag. Remote smoking history 0 quit 1979. ETOH abuse , quit 1975. Caffeine ;: rarely ice tea, coffee or soda.  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     Review of Systems: Out of a complete 14 system review, the patient complains of only the following symptoms, and all other reviewed systems are negative.  He reports numbness weakness chronic back pain, swelling in his legs nocturia fatigue restless legs and that he is known to snore and have apnea.  Carries a diagnosis of depression, heart disease, atrial fibrillation, hypertension morbid obesity, he has been treated for chronic pain and fibromyalgia by [name] his primary care provider.   Epworth score: 10 from 13 / 24   , Fatigue severity score : up from 45 to 52 on CPAP   , depression score 9/ 15 points.    Social History   Socioeconomic History  . Marital status: Divorced    Spouse name: Not on file  . Number of children: Not on file  . Years of education: Not on file  . Highest education level: Not on file  Occupational History  . Not on file  Social Needs  . Financial resource strain: Not on file  . Food insecurity:    Worry: Not on file    Inability: Not on file    . Transportation needs:    Medical: Not on file    Non-medical: Not on file  Tobacco Use  . Smoking status: Former Games developer  . Smokeless tobacco: Former Neurosurgeon    Quit date: 03/22/1996  Substance and Sexual Activity  . Alcohol use: No  . Drug use: No  . Sexual activity: Not on file  Lifestyle  . Physical activity:    Days per week: Not on file    Minutes per session: Not on file  . Stress: Not on file  Relationships  . Social connections:    Talks on phone: Not on file    Gets together: Not on file    Attends religious service: Not on file    Active member of club or organization: Not on file    Attends meetings of clubs or organizations: Not on file    Relationship status: Not on file  . Intimate partner violence:    Fear of current or ex partner: Not on file    Emotionally abused: Not on file    Physically abused: Not on file    Forced sexual activity: Not on file  Other Topics Concern  . Not on file  Social History Narrative  . Not on file    Family History  Problem Relation Age of Onset  . Heart attack Mother   . Stroke Father     Past Medical History:  Diagnosis Date  . Atrial fibrillation (HCC)    pt on Eliquis  . Chest pain   . CHF (congestive heart failure) (HCC)   . Depression   . Edema   . HTN (hypertension)   . Hyperlipidemia   . Hypothyroidism   . Neuropathy   . Obesity   . Renal insufficiency 12/04/2017  . SOB (shortness of breath)   . Stroke [facility])     Past Surgical History:  Procedure Laterality Date  . BACK SURGERY    . CARDIAC CATHETERIZATION    . CARDIAC CATHETERIZATION N/A 04/30/2016   Procedure: Right/Left Heart Cath and Coronary Angiography;  Surgeon: Kathleene Hazel, MD;  [facility] INVAS[facility] CV LAB;  Service: Cardiovascular;  Laterality: N/A;  . CAROTID ANGIOGRAM N/A 05/30/2014   Procedure: Dorise Bullion;  Surgeon: Nada Libman, MD;  Location: [facility] CATH LAB;  Service: Cardiovascular;  Laterality: N/A;  . MULTIPLE  EXTRACTIONS WITH ALVEOLOPLASTY N/A 05/02/2016   Procedure: Extraction of tooth #'s 7, 10, 23, 24, 25,and 26 with alveoloplasty.;  Surgeon: Charlynne Pander, DDS;  Location: [facility] OR;  Service: Oral Surgery;  Laterality: N/A;  . TEE WITHOUT CARDIOVERSION N/A 05/06/2016   Procedure: TRANSESOPHAGEAL ECHOCARDIOGRAM (TEE);  Surgeon: Kathleene Hazel, MD;  Location: [facility] OR;  Service: Open Heart Surgery;  Laterality: N/A;  . TRANSCATHETER AORTIC VALVE REPLACEMENT, TRANSFEMORAL N/A 05/06/2016   Procedure: TRANSCATHETER AORTIC VALVE REPLACEMENT, TRANSFEMORAL using a 23mm Edwards Sapien 3 Transcatheter Heart Valve;  Surgeon: Kathleene Hazel, MD;  Location: [facility] OR;  Service: Open Heart Surgery;  Laterality: N/A;    Current Outpatient Medications  Medication Sig Dispense Refill  . acetaminophen (TYLENOL) 325 MG tablet Take 2 tablets (650 mg total) by mouth every 4 (four) hours as needed for headache or mild pain.    Marland Kitchen amiodarone (PACERONE) 200 MG tablet Take 1 tablet (200 mg total) by mouth daily. 90 tablet 1  . atorvastatin (LIPITOR) 40 MG tablet Take 1 tablet (40 mg total) by mouth daily at 6 PM. (Patient taking differently: Take 40 mg by mouth at bedtime. ) 30 tablet 0  . bisacodyl (DULCOLAX) 5 MG EC tablet Take 5 mg by mouth daily as needed for moderate constipation.    . DULoxetine (CYMBALTA) 60 MG capsule Take 60 mg by mouth daily.    . fluticasone (FLONASE) 50 MCG/ACT nasal spray Place 1 spray into both nostrils daily as needed for allergies.   10  . furosemide (LASIX) 80 MG tablet Take 120 mg by mouth 2 (two) times daily.     Marland Kitchen gabapentin (NEURONTIN) 400 MG capsule Take 800 mg by mouth at bedtime.     Marland Kitchen levothyroxine (SYNTHROID, LEVOTHROID) 100 MCG tablet Take 100 mcg by mouth daily.     . metoprolol tartrate (LOPRESSOR) 25 MG tablet Take 25 mg by mouth 2 (two) times daily.     . nitroGLYCERIN (NITROSTAT) 0.4 MG SL tablet Place 1 tablet (0.4 mg total) under the tongue every 5 (five) minutes as  needed for chest pain. 20 tablet 1  . omeprazole (PRILOSEC) 40 MG capsule Take 40 mg by mouth daily.    . polyethylene glycol powder (GLYCOLAX/MIRALAX) powder Take 17 g by mouth daily. (Patient taking differently: Take 17 g by mouth daily as needed (constipation). ) 255 g 0  . spironolactone (ALDACTONE) 50 MG tablet Take 50 mg by mouth daily.    . traMADol (ULTRAM) 50 MG tablet Take 100 mg by mouth 3 (three) times daily as needed for moderate pain.     Marland Kitchen warfarin (COUMADIN) 5 MG tablet Take 1.5 tablets (7.5 mg total) by mouth daily. Take as directed by PCP    . zolpidem (AMBIEN) 5 MG tablet Take 5 mg by mouth at bedtime.      No current facility-administered medications for this visit.     Allergies as of 12/09/2017  . (No Known Allergies)    Vitals: BP 118/67   Pulse 65   Ht 6\' 2"  (1.88 m)   Wt (!) 339 lb (153.8 kg)   BMI 43.53 kg/m  Last Weight:  Wt Readings from Last 1 Encounters:  12/09/17 (!) 339 lb (153.8 kg)   ZOX:WRUE mass index is  43.53 kg/m.     Last Height:   Ht Readings from Last 1 Encounters:  12/09/17 6\' 2"  (1.88 m)    Physical exam:  General: The patient is awake, alert and appears not in acute distress. The patient has poor dentition . Head: Normocephalic, atraumatic. Neck is supple. Mallampati 5,   neck circumference:19. Nasal airflow congested , Cardiovascular:  irregular rate and rhythm, with ejection murmur. Respiratory: wheezing. SOB while sitting at rest. On o2 !  Skin:  Ankle and leg edema- with pin and needles.  Trunk: BMI is 43.5  Neurologic exam : The patient is awake and alert, oriented to place and time.    Attention span & concentration ability appears normal.  Speech is fluent, with dysphonia and shortness of breath .  Mood and affect are appropriate.  Cranial nerves: Pupils are equal and briskly reactive to light.  Facial motor strength : left lower facial droop. tongue and uvula move midline.  Shoulder shrug was symmetrical.   Motor  exam:  Normal tone, muscle bulk and symmetric strength in upper extremities.   Patient is left handed. Finger-to-nose maneuver normal without evidence of ataxia, dysmetria or tremor.  Gait and station: Patient walks with a cane as assistive device. Not further evaluated .  Deep tendon reflexes: in the  upper and lower extremities are asymmetric - remainder of left upper extremity reflexes were more brisk than right, but he reports his stroke affected the right body (?) no patella or achilles tendon reflexes were elicited.     I found the recent sleep study-  I would like to thank Harrold Donath, wife will forward a copy of the sleep study to me the patient was at the time followed by Dr. Burnell Blanks, referred to [facility] the sleep study was done on 09 February 2012.   He had a total number of 143 apneas and hypopneas, the AHI was 75/h, there was no breakdown into obstructive versus central apnea, REM versus non-REM apnea, supine versus nonsupine apnea.  Periodic limb movements prior to CPAP initiation were seen at an index of 3.9 but there is no definition of related arousals.  I can also not find the total time and oxygen desaturation.  The patient was apparently titrated to CPAP with a final level of 13 cmH2O and an AHI of 0.0 the interpreting physician was Dr. Louis Meckel.   Assessment:  After physical and neurologic examination, review of laboratory studies,  Personal review of imaging studies, reports of other /same  Imaging studies, results of polysomnography and / or neurophysiology testing and pre-existing records as far as provided in visit., my assessment is   1)  Patient with all risk factors for severe and complex sleep apnea- will need an attended sleep study with Co2 and o2 -  CHF, anbormal rhythm, atrial fib, ankle edema, HTN and morbid obesity on multiple medications, some are respiratory supressants.  The patient's body mass index is 43.  OSA was severe dur to prolonged  hypoxemia, which resolved under CPAP 13-15 cm water. He uses CPAP now compliantly   2) Diastolic HF decompensation - heart failure- now 24/7 on oxygen after hospitalization. Did not get instructed how to use o2 with CPAP-   3) He was advised that there is no chronic pain therapy or pain management in this practice.  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   The patient was advised of the nature of the diagnosed disorder , the treatment options  and the  risks for general health and wellness arising from not treating the condition.   I spent more than 25 minutes of face to face time with the patient, looking at his hospital evaluation and Dr Fabio Bering oxygen order.  He will sign the patient s need for oxygen at home, as I cannot document need by sleep study- we were able at the time to raise the nadir under CPAP alone.    Greater than 50% of time was spent in counseling and coordination of care. We have discussed the diagnosis and differential and I answered the patient's questions.    Plan:  Treatment plan and additional workup :  I have asked repeatedly not to use a FFM for this patient-  Dr Eden Emms documented in hospital his  needs  For o2 supplementation-. [facility] to bleed o2 into CPAP at current settings and exchange FFM to a nasal mask.      Rv in 6 month with Np; Epworth, Fatigue score and compliance.      Melvyn Novas, MD 12/09/2017, 2:23 PM  Certified in Neurology by ABPN Certified in Sleep Medicine by [facility] [address], [address] [place], [place] [address]

## 2017-12-12 ENCOUNTER — Encounter (HOSPITAL_COMMUNITY): Payer: Self-pay

## 2017-12-12 ENCOUNTER — Inpatient Hospital Stay (HOSPITAL_COMMUNITY)
Admission: EM | Admit: 2017-12-12 | Discharge: 2017-12-15 | DRG: 312 | Disposition: A | Discharge status: Home Health | Payer: Medicare Other | Attending: Internal Medicine | Admitting: Internal Medicine

## 2017-12-12 ENCOUNTER — Other Ambulatory Visit: Payer: Self-pay

## 2017-12-12 ENCOUNTER — Emergency Department (HOSPITAL_COMMUNITY): Payer: Medicare Other

## 2017-12-12 DIAGNOSIS — G4733 Obstructive sleep apnea (adult) (pediatric): Secondary | ICD-10-CM

## 2017-12-12 DIAGNOSIS — F329 Major depressive disorder, single episode, unspecified: Secondary | ICD-10-CM | POA: Diagnosis present

## 2017-12-12 DIAGNOSIS — S301XXA Contusion of abdominal wall, initial encounter: Secondary | ICD-10-CM

## 2017-12-12 DIAGNOSIS — I130 Hypertensive heart and chronic kidney disease with heart failure and stage 1 through stage 4 chronic kidney disease, or unspecified chronic kidney disease: Secondary | ICD-10-CM | POA: Diagnosis present

## 2017-12-12 DIAGNOSIS — G629 Polyneuropathy, unspecified: Secondary | ICD-10-CM | POA: Diagnosis present

## 2017-12-12 DIAGNOSIS — E785 Hyperlipidemia, unspecified: Secondary | ICD-10-CM | POA: Diagnosis present

## 2017-12-12 DIAGNOSIS — R0902 Hypoxemia: Secondary | ICD-10-CM | POA: Diagnosis present

## 2017-12-12 DIAGNOSIS — N183 Chronic kidney disease, stage 3 unspecified: Secondary | ICD-10-CM

## 2017-12-12 DIAGNOSIS — E860 Dehydration: Secondary | ICD-10-CM | POA: Diagnosis present

## 2017-12-12 DIAGNOSIS — T1490XA Injury, unspecified, initial encounter: Secondary | ICD-10-CM

## 2017-12-12 DIAGNOSIS — I951 Orthostatic hypotension: Principal | ICD-10-CM

## 2017-12-12 DIAGNOSIS — S20212A Contusion of left front wall of thorax, initial encounter: Secondary | ICD-10-CM

## 2017-12-12 DIAGNOSIS — I10 Essential (primary) hypertension: Secondary | ICD-10-CM | POA: Diagnosis present

## 2017-12-12 DIAGNOSIS — G4734 Idiopathic sleep related nonobstructive alveolar hypoventilation: Secondary | ICD-10-CM

## 2017-12-12 DIAGNOSIS — S8001XA Contusion of right knee, initial encounter: Secondary | ICD-10-CM

## 2017-12-12 DIAGNOSIS — R55 Syncope and collapse: Secondary | ICD-10-CM | POA: Diagnosis not present

## 2017-12-12 DIAGNOSIS — J449 Chronic obstructive pulmonary disease, unspecified: Secondary | ICD-10-CM | POA: Diagnosis present

## 2017-12-12 DIAGNOSIS — Z9889 Other specified postprocedural states: Secondary | ICD-10-CM

## 2017-12-12 DIAGNOSIS — Z7901 Long term (current) use of anticoagulants: Secondary | ICD-10-CM

## 2017-12-12 DIAGNOSIS — Z79899 Other long term (current) drug therapy: Secondary | ICD-10-CM

## 2017-12-12 DIAGNOSIS — Z952 Presence of prosthetic heart valve: Secondary | ICD-10-CM

## 2017-12-12 DIAGNOSIS — I4891 Unspecified atrial fibrillation: Secondary | ICD-10-CM

## 2017-12-12 DIAGNOSIS — I5032 Chronic diastolic (congestive) heart failure: Secondary | ICD-10-CM | POA: Diagnosis present

## 2017-12-12 DIAGNOSIS — Z6841 Body Mass Index (BMI) 40.0 and over, adult: Secondary | ICD-10-CM

## 2017-12-12 DIAGNOSIS — I252 Old myocardial infarction: Secondary | ICD-10-CM

## 2017-12-12 DIAGNOSIS — I214 Non-ST elevation (NSTEMI) myocardial infarction: Secondary | ICD-10-CM | POA: Diagnosis present

## 2017-12-12 DIAGNOSIS — Y92410 Unspecified street and highway as the place of occurrence of the external cause: Secondary | ICD-10-CM

## 2017-12-12 DIAGNOSIS — I480 Paroxysmal atrial fibrillation: Secondary | ICD-10-CM | POA: Diagnosis present

## 2017-12-12 DIAGNOSIS — I2510 Atherosclerotic heart disease of native coronary artery without angina pectoris: Secondary | ICD-10-CM | POA: Diagnosis present

## 2017-12-12 DIAGNOSIS — I5030 Unspecified diastolic (congestive) heart failure: Secondary | ICD-10-CM

## 2017-12-12 DIAGNOSIS — Z9989 Dependence on other enabling machines and devices: Secondary | ICD-10-CM

## 2017-12-12 DIAGNOSIS — E039 Hypothyroidism, unspecified: Secondary | ICD-10-CM | POA: Diagnosis present

## 2017-12-12 DIAGNOSIS — Z87891 Personal history of nicotine dependence: Secondary | ICD-10-CM

## 2017-12-12 DIAGNOSIS — I69351 Hemiplegia and hemiparesis following cerebral infarction affecting right dominant side: Secondary | ICD-10-CM

## 2017-12-12 DIAGNOSIS — I509 Heart failure, unspecified: Secondary | ICD-10-CM

## 2017-12-12 LAB — URINALYSIS, ROUTINE W REFLEX MICROSCOPIC
BACTERIA UA: NONE SEEN
Bilirubin Urine: NEGATIVE
Glucose, UA: NEGATIVE mg/dL
KETONES UR: NEGATIVE mg/dL
Leukocytes, UA: NEGATIVE
NITRITE: NEGATIVE
PROTEIN: NEGATIVE mg/dL
Specific Gravity, Urine: 1.016 (ref 1.005–1.030)
pH: 6 (ref 5.0–8.0)

## 2017-12-12 LAB — BPAM RBC
BLOOD PRODUCT EXPIRATION DATE: 201909302359
Blood Product Expiration Date: 201909302359
ISSUE DATE / TIME: 201909071358
ISSUE DATE / TIME: 201909071358
UNIT TYPE AND RH: 9500
Unit Type and Rh: 9500

## 2017-12-12 LAB — COMPREHENSIVE METABOLIC PANEL
ALK PHOS: 93 U/L (ref 38–126)
ALT: 52 U/L — AB (ref 0–44)
AST: 47 U/L — ABNORMAL HIGH (ref 15–41)
Albumin: 3.8 g/dL (ref 3.5–5.0)
Anion gap: 11 (ref 5–15)
BILIRUBIN TOTAL: 0.8 mg/dL (ref 0.3–1.2)
BUN: 23 mg/dL (ref 8–23)
CALCIUM: 8.6 mg/dL — AB (ref 8.9–10.3)
CO2: 29 mmol/L (ref 22–32)
CREATININE: 1.54 mg/dL — AB (ref 0.61–1.24)
Chloride: 97 mmol/L — ABNORMAL LOW (ref 98–111)
GFR, EST AFRICAN AMERICAN: 50 mL/min — AB (ref >60)
GFR, EST NON AFRICAN AMERICAN: 43 mL/min — AB (ref >60)
Glucose, Bld: 179 mg/dL — ABNORMAL HIGH (ref 70–99)
Potassium: 4.1 mmol/L (ref 3.5–5.1)
Sodium: 137 mmol/L (ref 135–145)
Total Protein: 7.4 g/dL (ref 6.5–8.1)

## 2017-12-12 LAB — BPAM FFP
Blood Product Expiration Date: 201909112359
Blood Product Expiration Date: 201909112359
ISSUE DATE / TIME: 201909071401
ISSUE DATE / TIME: 201909071401
UNIT TYPE AND RH: 600
Unit Type and Rh: 6200

## 2017-12-12 LAB — TYPE AND SCREEN
ABO/RH(D): O POS
ANTIBODY SCREEN: NEGATIVE
UNIT DIVISION: 0
Unit division: 0

## 2017-12-12 LAB — PREPARE FRESH FROZEN PLASMA
Unit division: 0
Unit division: 0

## 2017-12-12 LAB — I-STAT CHEM 8, ED
BUN: 28 mg/dL — ABNORMAL HIGH (ref 8–23)
CALCIUM ION: 1.03 mmol/L — AB (ref 1.15–1.40)
CHLORIDE: 98 mmol/L (ref 98–111)
CREATININE: 1.6 mg/dL — AB (ref 0.61–1.24)
GLUCOSE: 172 mg/dL — AB (ref 70–99)
HCT: 45 % (ref 39.0–52.0)
Hemoglobin: 15.3 g/dL (ref 13.0–17.0)
POTASSIUM: 4.1 mmol/L (ref 3.5–5.1)
Sodium: 137 mmol/L (ref 135–145)
TCO2: 30 mmol/L (ref 22–32)

## 2017-12-12 LAB — I-STAT CG4 LACTIC ACID, ED: Lactic Acid, Venous: 1.87 mmol/L (ref 0.5–1.9)

## 2017-12-12 LAB — CBC
HCT: 46 % (ref 39.0–52.0)
Hemoglobin: 14.9 g/dL (ref 13.0–17.0)
MCH: 31.5 pg (ref 26.0–34.0)
MCHC: 32.4 g/dL (ref 30.0–36.0)
MCV: 97.3 fL (ref 78.0–100.0)
PLATELETS: 271 10*3/uL (ref 150–400)
RBC: 4.73 MIL/uL (ref 4.22–5.81)
RDW: 13.5 % (ref 11.5–15.5)
WBC: 12.2 10*3/uL — AB (ref 4.0–10.5)

## 2017-12-12 LAB — TROPONIN I: Troponin I: <0.03 ng/mL (ref <0.03)

## 2017-12-12 LAB — I-STAT TROPONIN, ED: TROPONIN I, POC: 0 ng/mL (ref 0.00–0.08)

## 2017-12-12 LAB — PROTIME-INR
INR: 1.44
Prothrombin Time: 17.4 seconds — ABNORMAL HIGH (ref 11.4–15.2)

## 2017-12-12 LAB — BRAIN NATRIURETIC PEPTIDE: B NATRIURETIC PEPTIDE 5: 18.2 pg/mL (ref 0.0–100.0)

## 2017-12-12 LAB — CDS SEROLOGY

## 2017-12-12 LAB — ETHANOL

## 2017-12-12 LAB — HEMOGLOBIN A1C
Hgb A1c MFr Bld: 6.5 % — ABNORMAL HIGH (ref 4.8–5.6)
MEAN PLASMA GLUCOSE: 139.85 mg/dL

## 2017-12-12 LAB — TSH: TSH: 2.726 u[IU]/mL (ref 0.350–4.500)

## 2017-12-12 MED ORDER — ONDANSETRON HCL 4 MG PO TABS: 4.0000 mg | ORAL_TABLET | Freq: Four times a day (QID) | ORAL | Status: DC | PRN

## 2017-12-12 MED ORDER — ONDANSETRON HCL 4 MG/2ML IJ SOLN
4.0000 mg | Freq: Once | INTRAMUSCULAR | Status: DC
  Filled 2017-12-12: qty 2

## 2017-12-12 MED ORDER — TRAMADOL HCL 50 MG PO TABS
100.0000 mg | ORAL_TABLET | Freq: Three times a day (TID) | ORAL | Status: DC | PRN
  Administered 2017-12-12 – 2017-12-15 (×4): 100 mg via ORAL
  Filled 2017-12-12 (×4): qty 2

## 2017-12-12 MED ORDER — PANTOPRAZOLE SODIUM 40 MG PO TBEC
40.0000 mg | DELAYED_RELEASE_TABLET | Freq: Every day | ORAL | Status: DC
  Administered 2017-12-13 – 2017-12-15 (×3): 40 mg via ORAL
  Filled 2017-12-12 (×3): qty 1

## 2017-12-12 MED ORDER — BISACODYL 5 MG PO TBEC: 5.0000 mg | DELAYED_RELEASE_TABLET | Freq: Every day | ORAL | Status: DC | PRN

## 2017-12-12 MED ORDER — LEVOTHYROXINE SODIUM 100 MCG PO TABS
100.0000 ug | ORAL_TABLET | Freq: Every day | ORAL | Status: DC
  Administered 2017-12-13 – 2017-12-15 (×3): 100 ug via ORAL
  Filled 2017-12-12 (×3): qty 1

## 2017-12-12 MED ORDER — WARFARIN SODIUM 7.5 MG PO TABS
7.5000 mg | ORAL_TABLET | Freq: Every day | ORAL | Status: DC
  Administered 2017-12-13 – 2017-12-15 (×3): 7.5 mg via ORAL
  Filled 2017-12-12 (×3): qty 1

## 2017-12-12 MED ORDER — IOPAMIDOL (ISOVUE-300) INJECTION 61%
INTRAVENOUS | Status: AC
  Filled 2017-12-12: qty 100

## 2017-12-12 MED ORDER — POLYETHYLENE GLYCOL 3350 17 GM/SCOOP PO POWD
17.0000 g | Freq: Every day | ORAL | Status: DC
  Filled 2017-12-12: qty 255

## 2017-12-12 MED ORDER — ATORVASTATIN CALCIUM 40 MG PO TABS
40.0000 mg | ORAL_TABLET | Freq: Every day | ORAL | Status: DC
  Administered 2017-12-13 – 2017-12-14 (×2): 40 mg via ORAL
  Filled 2017-12-12 (×3): qty 1

## 2017-12-12 MED ORDER — SODIUM CHLORIDE 0.9% FLUSH
3.0000 mL | Freq: Two times a day (BID) | INTRAVENOUS | Status: DC
  Administered 2017-12-12 – 2017-12-15 (×5): 3 mL via INTRAVENOUS

## 2017-12-12 MED ORDER — POLYETHYLENE GLYCOL 3350 17 G PO PACK
17.0000 g | PACK | Freq: Every day | ORAL | Status: DC
  Administered 2017-12-13 – 2017-12-15 (×2): 17 g via ORAL
  Filled 2017-12-12 (×3): qty 1

## 2017-12-12 MED ORDER — ONDANSETRON HCL 4 MG/2ML IJ SOLN: 4.0000 mg | Freq: Four times a day (QID) | INTRAMUSCULAR | Status: DC | PRN

## 2017-12-12 MED ORDER — DULOXETINE HCL 60 MG PO CPEP
60.0000 mg | ORAL_CAPSULE | Freq: Every day | ORAL | Status: DC
  Administered 2017-12-13 – 2017-12-15 (×3): 60 mg via ORAL
  Filled 2017-12-12 (×3): qty 1

## 2017-12-12 MED ORDER — METOPROLOL TARTRATE 25 MG PO TABS
25.0000 mg | ORAL_TABLET | Freq: Two times a day (BID) | ORAL | Status: DC
  Administered 2017-12-12: 25 mg via ORAL
  Filled 2017-12-12: qty 1

## 2017-12-12 MED ORDER — SODIUM CHLORIDE 0.9 % IV BOLUS
1000.0000 mL | Freq: Once | INTRAVENOUS | Status: AC
  Administered 2017-12-12: 1000 mL via INTRAVENOUS

## 2017-12-12 MED ORDER — FUROSEMIDE 40 MG PO TABS
120.0000 mg | ORAL_TABLET | Freq: Two times a day (BID) | ORAL | Status: DC
  Administered 2017-12-12 – 2017-12-13 (×2): 120 mg via ORAL
  Filled 2017-12-12 (×2): qty 1

## 2017-12-12 MED ORDER — SODIUM CHLORIDE 0.9 % IV SOLN
INTRAVENOUS | Status: DC
  Administered 2017-12-12 – 2017-12-15 (×3): via INTRAVENOUS

## 2017-12-12 MED ORDER — MORPHINE SULFATE (PF) 4 MG/ML IV SOLN
4.0000 mg | Freq: Once | INTRAVENOUS | Status: DC
  Filled 2017-12-12: qty 1

## 2017-12-12 MED ORDER — WARFARIN SODIUM 7.5 MG PO TABS: 7.5000 mg | ORAL_TABLET | ORAL | Status: DC

## 2017-12-12 MED ORDER — SPIRONOLACTONE 25 MG PO TABS: 50.0000 mg | ORAL_TABLET | Freq: Every day | ORAL | Status: DC

## 2017-12-12 MED ORDER — FLUTICASONE PROPIONATE 50 MCG/ACT NA SUSP
1.0000 | Freq: Every day | NASAL | Status: DC | PRN
  Filled 2017-12-12: qty 16

## 2017-12-12 MED ORDER — WARFARIN SODIUM 7.5 MG PO TABS
7.5000 mg | ORAL_TABLET | Freq: Every day | ORAL | Status: DC
  Filled 2017-12-12: qty 1

## 2017-12-12 MED ORDER — SODIUM CHLORIDE 0.9 % IV BOLUS: 1000.0000 mL | Freq: Once | INTRAVENOUS | Status: DC

## 2017-12-12 MED ORDER — WARFARIN - PHYSICIAN DOSING INPATIENT
Freq: Every day | Status: DC
  Administered 2017-12-14: 1

## 2017-12-12 MED ORDER — NITROGLYCERIN 0.4 MG SL SUBL: 0.4000 mg | SUBLINGUAL_TABLET | SUBLINGUAL | Status: DC | PRN

## 2017-12-12 MED ORDER — IOPAMIDOL (ISOVUE-300) INJECTION 61%
80.0000 mL | Freq: Once | INTRAVENOUS | Status: AC | PRN
  Administered 2017-12-12: 100 mL via INTRAVENOUS

## 2017-12-12 MED ORDER — AMIODARONE HCL 200 MG PO TABS
200.0000 mg | ORAL_TABLET | Freq: Every day | ORAL | Status: DC
  Administered 2017-12-13 – 2017-12-15 (×3): 200 mg via ORAL
  Filled 2017-12-12 (×3): qty 1

## 2017-12-12 MED ORDER — GABAPENTIN 400 MG PO CAPS
800.0000 mg | ORAL_CAPSULE | Freq: Every day | ORAL | Status: DC
  Administered 2017-12-12 – 2017-12-14 (×3): 800 mg via ORAL
  Filled 2017-12-12 (×3): qty 2

## 2017-12-12 MED ORDER — ACETAMINOPHEN 325 MG PO TABS
650.0000 mg | ORAL_TABLET | ORAL | Status: DC | PRN
  Administered 2017-12-13 – 2017-12-14 (×3): 650 mg via ORAL
  Filled 2017-12-12 (×3): qty 2

## 2017-12-12 MED ORDER — ZOLPIDEM TARTRATE 5 MG PO TABS: 5.0000 mg | ORAL_TABLET | Freq: Every evening | ORAL | Status: DC | PRN
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
## 2017-12-12 NOTE — ED Notes (Signed)
Pt transported to CT with this RN 
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
## 2017-12-13 DIAGNOSIS — R55 Syncope and collapse: Secondary | ICD-10-CM | POA: Diagnosis not present

## 2017-12-13 LAB — TROPONIN I

## 2017-12-13 LAB — GLUCOSE, CAPILLARY
Glucose-Capillary: 125 mg/dL — ABNORMAL HIGH (ref 70–99)
Glucose-Capillary: 122 mg/dL — ABNORMAL HIGH (ref 70–99)

## 2017-12-13 LAB — COMPREHENSIVE METABOLIC PANEL
ALT: 36 U/L (ref 0–44)
ANION GAP: 11 (ref 5–15)
AST: 32 U/L (ref 15–41)
Albumin: 3.3 g/dL — ABNORMAL LOW (ref 3.5–5.0)
Alkaline Phosphatase: 73 U/L (ref 38–126)
BUN: 21 mg/dL (ref 8–23)
CHLORIDE: 100 mmol/L (ref 98–111)
CO2: 27 mmol/L (ref 22–32)
Calcium: 7.7 mg/dL — ABNORMAL LOW (ref 8.9–10.3)
Creatinine, Ser: 1.36 mg/dL — ABNORMAL HIGH (ref 0.61–1.24)
GFR, EST AFRICAN AMERICAN: 58 mL/min — AB (ref >60)
GFR, EST NON AFRICAN AMERICAN: 50 mL/min — AB (ref >60)
Glucose, Bld: 114 mg/dL — ABNORMAL HIGH (ref 70–99)
POTASSIUM: 3.6 mmol/L (ref 3.5–5.1)
SODIUM: 138 mmol/L (ref 135–145)
Total Bilirubin: 0.8 mg/dL (ref 0.3–1.2)
Total Protein: 6.3 g/dL — ABNORMAL LOW (ref 6.5–8.1)

## 2017-12-13 LAB — CBC
HEMATOCRIT: 39.8 % (ref 39.0–52.0)
HEMOGLOBIN: 12.8 g/dL — AB (ref 13.0–17.0)
MCH: 31.5 pg (ref 26.0–34.0)
MCHC: 32.2 g/dL (ref 30.0–36.0)
MCV: 98 fL (ref 78.0–100.0)
PLATELETS: 222 10*3/uL (ref 150–400)
RBC: 4.06 MIL/uL — ABNORMAL LOW (ref 4.22–5.81)
RDW: 13.7 % (ref 11.5–15.5)
WBC: 8.8 10*3/uL (ref 4.0–10.5)

## 2017-12-13 MED ORDER — SPIRONOLACTONE 25 MG PO TABS
50.0000 mg | ORAL_TABLET | Freq: Every day | ORAL | Status: DC
  Administered 2017-12-13 – 2017-12-14 (×2): 50 mg via ORAL
  Filled 2017-12-13 (×2): qty 2

## 2017-12-13 MED ORDER — FUROSEMIDE 80 MG PO TABS
120.0000 mg | ORAL_TABLET | Freq: Two times a day (BID) | ORAL | Status: DC
  Administered 2017-12-14: 120 mg via ORAL
  Filled 2017-12-13: qty 1
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
## 2017-12-13 NOTE — Care Management Obs Status (Signed)
MEDICARE OBSERVATION STATUS NOTIFICATION   Patient Details  Name: Jorge [name] MRN: [id number] Date of Birth: September 24, 1945   Medicare Observation Status Notification Given:  Yes    Lawerance Sabal, RN 12/13/2017, 4:04 PM

## 2017-12-13 NOTE — Clinical Social Work Note (Signed)
CSW acknowledges SNF consult. Please consult PT and OT to evaluate patient.  Charlynn Court, CSW [phone]

## 2017-12-13 NOTE — Progress Notes (Addendum)
Pt feels dizzy after sitting on sit of bed for breakfast, b/p 91/23 patient back in bed. Dr. Jens Som rounding at bedside is aware and will adjust medications.
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
## 2017-12-13 NOTE — Plan of Care (Signed)
  Problem: Education: Goal: Knowledge of General Education information will improve Description Including pain rating scale, medication(s)/side effects and non-pharmacologic comfort measures Outcome: Progressing   Problem: Clinical Measurements: Goal: Will remain free from infection Outcome: Progressing   Problem: Clinical Measurements: Goal: Diagnostic test results will improve Outcome: Progressing   Problem: Clinical Measurements: Goal: Respiratory complications will improve Outcome: Progressing Teach back on incentive spirometer.   Problem: Clinical Measurements: Goal: Cardiovascular complication will be avoided Outcome: Progressing   Problem: Activity: Goal: Risk for activity intolerance will decrease Outcome: Progressing  Patient is able to verbalize limits of activity Problem: Nutrition: Goal: Adequate nutrition will be maintained Outcome: Progressing Patient is eating according to diet prescribed and eating 100% of meals   Problem: Coping: Goal: Level of anxiety will decrease Outcome: Progressing Patient was seeing by pastor from church. Patient was more compliant and calm.   Problem: Pain Managment: Goal: General experience of comfort will improve Outcome: Progressing

## 2017-12-13 NOTE — Progress Notes (Signed)
Patient has home machine, H20 aded and patient placed himself on without any issues. 3L O2 bleed in added and he will call when ready to come off
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
## 2017-12-14 DIAGNOSIS — I5032 Chronic diastolic (congestive) heart failure: Secondary | ICD-10-CM | POA: Diagnosis present

## 2017-12-14 DIAGNOSIS — Z9989 Dependence on other enabling machines and devices: Secondary | ICD-10-CM | POA: Diagnosis not present

## 2017-12-14 DIAGNOSIS — I5033 Acute on chronic diastolic (congestive) heart failure: Secondary | ICD-10-CM

## 2017-12-14 DIAGNOSIS — J449 Chronic obstructive pulmonary disease, unspecified: Secondary | ICD-10-CM | POA: Diagnosis present

## 2017-12-14 DIAGNOSIS — F329 Major depressive disorder, single episode, unspecified: Secondary | ICD-10-CM | POA: Diagnosis present

## 2017-12-14 DIAGNOSIS — Z9889 Other specified postprocedural states: Secondary | ICD-10-CM | POA: Diagnosis not present

## 2017-12-14 DIAGNOSIS — Z87891 Personal history of nicotine dependence: Secondary | ICD-10-CM | POA: Diagnosis not present

## 2017-12-14 DIAGNOSIS — R55 Syncope and collapse: Secondary | ICD-10-CM | POA: Diagnosis not present

## 2017-12-14 DIAGNOSIS — G4733 Obstructive sleep apnea (adult) (pediatric): Secondary | ICD-10-CM | POA: Diagnosis present

## 2017-12-14 DIAGNOSIS — R0902 Hypoxemia: Secondary | ICD-10-CM | POA: Diagnosis present

## 2017-12-14 DIAGNOSIS — Z7901 Long term (current) use of anticoagulants: Secondary | ICD-10-CM | POA: Diagnosis not present

## 2017-12-14 DIAGNOSIS — I951 Orthostatic hypotension: Secondary | ICD-10-CM | POA: Diagnosis not present

## 2017-12-14 DIAGNOSIS — G629 Polyneuropathy, unspecified: Secondary | ICD-10-CM | POA: Diagnosis present

## 2017-12-14 DIAGNOSIS — I69351 Hemiplegia and hemiparesis following cerebral infarction affecting right dominant side: Secondary | ICD-10-CM | POA: Diagnosis not present

## 2017-12-14 DIAGNOSIS — Z952 Presence of prosthetic heart valve: Secondary | ICD-10-CM | POA: Diagnosis not present

## 2017-12-14 DIAGNOSIS — I480 Paroxysmal atrial fibrillation: Secondary | ICD-10-CM | POA: Diagnosis present

## 2017-12-14 DIAGNOSIS — Y92410 Unspecified street and highway as the place of occurrence of the external cause: Secondary | ICD-10-CM | POA: Diagnosis not present

## 2017-12-14 DIAGNOSIS — I2510 Atherosclerotic heart disease of native coronary artery without angina pectoris: Secondary | ICD-10-CM | POA: Diagnosis present

## 2017-12-14 DIAGNOSIS — Z79899 Other long term (current) drug therapy: Secondary | ICD-10-CM | POA: Diagnosis not present

## 2017-12-14 DIAGNOSIS — Z6841 Body Mass Index (BMI) 40.0 and over, adult: Secondary | ICD-10-CM | POA: Diagnosis not present

## 2017-12-14 DIAGNOSIS — I252 Old myocardial infarction: Secondary | ICD-10-CM | POA: Diagnosis not present

## 2017-12-14 DIAGNOSIS — N183 Chronic kidney disease, stage 3 (moderate): Secondary | ICD-10-CM | POA: Diagnosis present

## 2017-12-14 DIAGNOSIS — E785 Hyperlipidemia, unspecified: Secondary | ICD-10-CM | POA: Diagnosis present

## 2017-12-14 DIAGNOSIS — E860 Dehydration: Secondary | ICD-10-CM | POA: Diagnosis present

## 2017-12-14 DIAGNOSIS — E039 Hypothyroidism, unspecified: Secondary | ICD-10-CM | POA: Diagnosis present

## 2017-12-14 DIAGNOSIS — I130 Hypertensive heart and chronic kidney disease with heart failure and stage 1 through stage 4 chronic kidney disease, or unspecified chronic kidney disease: Secondary | ICD-10-CM | POA: Diagnosis present

## 2017-12-14 LAB — BASIC METABOLIC PANEL
Anion gap: 9 (ref 5–15)
BUN: 16 mg/dL (ref 8–23)
CO2: 26 mmol/L (ref 22–32)
CREATININE: 1.06 mg/dL (ref 0.61–1.24)
Calcium: 7.7 mg/dL — ABNORMAL LOW (ref 8.9–10.3)
Chloride: 103 mmol/L (ref 98–111)
GFR calc Af Amer: >60 mL/min (ref >60)
GLUCOSE: 141 mg/dL — AB (ref 70–99)
POTASSIUM: 3.6 mmol/L (ref 3.5–5.1)
Sodium: 138 mmol/L (ref 135–145)

## 2017-12-14 LAB — GLUCOSE, CAPILLARY
GLUCOSE-CAPILLARY: 141 mg/dL — AB (ref 70–99)
Glucose-Capillary: 154 mg/dL — ABNORMAL HIGH (ref 70–99)
Glucose-Capillary: 156 mg/dL — ABNORMAL HIGH (ref 70–99)
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
## 2017-12-14 NOTE — Care Management Note (Addendum)
Case Management Note  Patient Details  Name: Jorge [name] MRN: [id number] Date of Birth: Mar 24, 1946  Subjective/Objective:     Syncope, Hypotension, CHF               Action/Plan: Spoke to pt at bedside. He is active with [facility] for [facility], and aide. Pt will need also need HHPT and SW added to orders. Pt has oxygen ([facility]), cane and CPAP at home. Pt requesting 3n1 bedside commode. Will send message to [facility] for [facility] resumption of care and 3n1. Lives alone. Wants to speak to SW about transportation. His sons work out of town. Dtr in law has small kids in home so assistance is limited. CSW referral for transportation.   Will need orders for [facility], PT, aide and SW with F2F.   Expected Discharge Date:                  Expected Discharge Plan:  Home w Home Health Services  In-House Referral:  NA  Discharge planning Services  CM Consult  Post Acute Care Choice:  Home Health, Resumption of Svcs/PTA Provider Choice offered to:  Patient  DME Arranged:  3-N-1 DME Agency:  [facility]  HH Arranged:  RN, Nurse's Aide [facility] Agency:     Status of Service:  In process, will continue to follow  If discussed at Long Length of Stay Meetings, dates discussed:    Additional Comments:  Elliot Cousin, RN 12/14/2017, 5:56 PM

## 2017-12-14 NOTE — Clinical Social Work Note (Addendum)
Clinical Social Work Assessment  Patient Details  Name: Jorge [name] MRN: [id number] Date of Birth: 11-29-1945  Date of referral:  12/14/17               Reason for consult:  Facility Placement, Discharge Planning                Permission sought to share information with:    Permission granted to share information::  No  Name::        Agency::     Relationship::     Contact Information:     Housing/Transportation Living arrangements for the past 2 months:  Apartment Source of Information:  Patient, Medical Team Patient Interpreter Needed:  None Criminal Activity/Legal Involvement Pertinent to Current Situation/Hospitalization:  No - Comment as needed Significant Relationships:  Adult Children Lives with:  Self Do you feel safe going back to the place where you live?  Yes Need for family participation in patient care:  Yes (Comment)  Care giving concerns:  PT recommending SNF placement once medically stable for discharge.   Social Worker assessment / plan:  CSW met with patient. No supports at bedside. CSW introduced role and explained that PT recommendations would be discussed. Patient refusing SNF. He would like to return home with [place]. He was receiving services through them prior to admission. RNCM notified. He stated someone comes in Tuesdays and Thursdays to bathe him. Patient's apartment is handicap accessible. Patient stated he only gets $1000 per month from social security and cannot afford SNF. CSW explained that insurance would cover at 100% for 20 days if needed but patient still refused. Patient keeps his cell phone on him so he can call 911 if he falls. CSW encouraged patient to notify staff if he changes his mind. No further concerns. CSW signing off as social work intervention is no longer needed.   Employment status:  Retired Nurse, adult PT Recommendations:  [place] / Referral to community  resources:  [place]  Patient/Family's Response to care:  Patient refusing SNF but is agreeable to HHPT. Patient's son supportive and involved in patient's care. Patient appreciated social work intervention.  Patient/Family's Understanding of and Emotional Response to Diagnosis, Current Treatment, and Prognosis:  Patient has a good understanding of the reason for admission and his need for continued therapy after discharge. Patient appears happy with hospital care.  Emotional Assessment Appearance:  Appears stated age Attitude/Demeanor/Rapport:  Engaged Affect (typically observed):  Appropriate, Calm, Pleasant Orientation:  Oriented to Self, Oriented to Place, Oriented to  Time, Oriented to Situation Alcohol / Substance use:  Tobacco Use Psych involvement (Current and /or in the community):  No (Comment)  Discharge Needs  Concerns to be addressed:  Care Coordination Readmission within the last 30 days:  Yes Current discharge risk:  Dependent with Mobility, Lives alone Barriers to Discharge:  Continued Medical Work up   Candie Chroman, LCSW 12/14/2017, 12:26 PM
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
## 2017-12-15 ENCOUNTER — Encounter (HOSPITAL_COMMUNITY): Payer: Self-pay

## 2017-12-15 DIAGNOSIS — Z9889 Other specified postprocedural states: Secondary | ICD-10-CM

## 2017-12-15 LAB — GLUCOSE, CAPILLARY
Glucose-Capillary: 128 mg/dL — ABNORMAL HIGH (ref 70–99)
Glucose-Capillary: 130 mg/dL — ABNORMAL HIGH (ref 70–99)

## 2017-12-15 LAB — PROTIME-INR
INR: 1.71
Prothrombin Time: 20 seconds — ABNORMAL HIGH (ref 11.4–15.2)

## 2017-12-15 LAB — BLOOD PRODUCT ORDER (VERBAL) VERIFICATION

## 2017-12-15 MED ORDER — FUROSEMIDE 40 MG PO TABS: 120.0000 mg | ORAL_TABLET | Freq: Every day | ORAL | 11 refills | Status: DC
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
## 2017-12-15 NOTE — Progress Notes (Signed)
To the best of my knowledge documentation by Si Gaul nursing student is correct.
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
## 2017-12-15 NOTE — Progress Notes (Signed)
Pt not feeling well enough to go home.  Will keep here and continue to provide supportive care.  Re-eval for DC in am.  Pt may need SNF

## 2017-12-15 NOTE — Clinical Social Work Note (Signed)
CSW provided patient with information for [facility] and the [facility]. He is familiar with [facility]. Patient was feeling concerned about going home due to concerns he will pass out and hit his head on furniture. CSW revisited previous SNF discussion but patient is still adamant he does not want SNF. PT is now recommending [facility] due to significant improvement. Patient now eager to return home. He called his daughter-in-law pick him up ands he said she could be here in 15 minutes. He has his oxygen tank in the closet in his room. She will page MD to notify her that patient wants to go home today.  CSW signing off.   Charlynn Court, CSW [phone]

## 2017-12-15 NOTE — Discharge Summary (Signed)
See previous discharge summary.  After discussion that he could not possibly go home patient was offered skilled facility and now is requesting discharge home.  No changes on previous discharge summary.

## 2017-12-15 NOTE — Progress Notes (Signed)
Pt is high fall risk. Pt has low bed, however pt raises bed to waist level.

## 2017-12-16 ENCOUNTER — Telehealth: Payer: Self-pay | Admitting: Neurology

## 2017-12-16 NOTE — Telephone Encounter (Signed)
Called the patient to make him aware that Dr Vickey Huger has reviewed all the noted from the hospital admission. Dr Dohmeier wouldn't have anything else to offer the patient as she feels this is related to the cardiology concern and not his cpap. We did address some of these issues also at his apt last week. Patient has both his oxygen and cpap through [employer] now. Patient states that he doesn't need this apt either. Apt will be cancelled for 01/07/18

## 2017-12-23 ENCOUNTER — Emergency Department (HOSPITAL_COMMUNITY): Payer: Medicare Other

## 2017-12-23 ENCOUNTER — Other Ambulatory Visit: Payer: Self-pay

## 2017-12-23 ENCOUNTER — Encounter (HOSPITAL_COMMUNITY): Payer: Self-pay

## 2017-12-23 ENCOUNTER — Emergency Department (HOSPITAL_COMMUNITY)
Admission: EM | Admit: 2017-12-23 | Discharge: 2017-12-24 | Disposition: A | Discharge status: Home / Self Care | Payer: Medicare Other | Source: Home / Self Care | Attending: Emergency Medicine | Admitting: Emergency Medicine

## 2017-12-23 DIAGNOSIS — W19XXXA Unspecified fall, initial encounter: Secondary | ICD-10-CM | POA: Diagnosis not present

## 2017-12-23 DIAGNOSIS — Y999 Unspecified external cause status: Secondary | ICD-10-CM

## 2017-12-23 DIAGNOSIS — I5033 Acute on chronic diastolic (congestive) heart failure: Secondary | ICD-10-CM | POA: Insufficient documentation

## 2017-12-23 DIAGNOSIS — E785 Hyperlipidemia, unspecified: Secondary | ICD-10-CM | POA: Diagnosis not present

## 2017-12-23 DIAGNOSIS — Z79899 Other long term (current) drug therapy: Secondary | ICD-10-CM | POA: Insufficient documentation

## 2017-12-23 DIAGNOSIS — Z23 Encounter for immunization: Secondary | ICD-10-CM | POA: Diagnosis not present

## 2017-12-23 DIAGNOSIS — I110 Hypertensive heart disease with heart failure: Secondary | ICD-10-CM

## 2017-12-23 DIAGNOSIS — G4733 Obstructive sleep apnea (adult) (pediatric): Secondary | ICD-10-CM | POA: Diagnosis not present

## 2017-12-23 DIAGNOSIS — I2510 Atherosclerotic heart disease of native coronary artery without angina pectoris: Secondary | ICD-10-CM

## 2017-12-23 DIAGNOSIS — I130 Hypertensive heart and chronic kidney disease with heart failure and stage 1 through stage 4 chronic kidney disease, or unspecified chronic kidney disease: Secondary | ICD-10-CM | POA: Diagnosis not present

## 2017-12-23 DIAGNOSIS — K8020 Calculus of gallbladder without cholecystitis without obstruction: Secondary | ICD-10-CM | POA: Diagnosis not present

## 2017-12-23 DIAGNOSIS — I5032 Chronic diastolic (congestive) heart failure: Secondary | ICD-10-CM | POA: Diagnosis not present

## 2017-12-23 DIAGNOSIS — Y939 Activity, unspecified: Secondary | ICD-10-CM | POA: Insufficient documentation

## 2017-12-23 DIAGNOSIS — Y929 Unspecified place or not applicable: Secondary | ICD-10-CM | POA: Insufficient documentation

## 2017-12-23 DIAGNOSIS — R269 Unspecified abnormalities of gait and mobility: Secondary | ICD-10-CM | POA: Diagnosis present

## 2017-12-23 DIAGNOSIS — Z87891 Personal history of nicotine dependence: Secondary | ICD-10-CM

## 2017-12-23 DIAGNOSIS — S7011XA Contusion of right thigh, initial encounter: Secondary | ICD-10-CM | POA: Insufficient documentation

## 2017-12-23 DIAGNOSIS — Z7901 Long term (current) use of anticoagulants: Secondary | ICD-10-CM | POA: Insufficient documentation

## 2017-12-23 DIAGNOSIS — I69351 Hemiplegia and hemiparesis following cerebral infarction affecting right dominant side: Secondary | ICD-10-CM | POA: Diagnosis not present

## 2017-12-23 DIAGNOSIS — I480 Paroxysmal atrial fibrillation: Secondary | ICD-10-CM | POA: Diagnosis not present

## 2017-12-23 DIAGNOSIS — G319 Degenerative disease of nervous system, unspecified: Secondary | ICD-10-CM | POA: Diagnosis not present

## 2017-12-23 DIAGNOSIS — M79604 Pain in right leg: Secondary | ICD-10-CM | POA: Diagnosis not present

## 2017-12-23 DIAGNOSIS — G629 Polyneuropathy, unspecified: Secondary | ICD-10-CM | POA: Diagnosis not present

## 2017-12-23 DIAGNOSIS — F329 Major depressive disorder, single episode, unspecified: Secondary | ICD-10-CM | POA: Diagnosis not present

## 2017-12-23 DIAGNOSIS — I4891 Unspecified atrial fibrillation: Secondary | ICD-10-CM | POA: Diagnosis not present

## 2017-12-23 DIAGNOSIS — E039 Hypothyroidism, unspecified: Secondary | ICD-10-CM

## 2017-12-23 DIAGNOSIS — R079 Chest pain, unspecified: Secondary | ICD-10-CM | POA: Diagnosis not present

## 2017-12-23 DIAGNOSIS — I6782 Cerebral ischemia: Secondary | ICD-10-CM | POA: Diagnosis not present

## 2017-12-23 DIAGNOSIS — Y9301 Activity, walking, marching and hiking: Secondary | ICD-10-CM | POA: Diagnosis not present

## 2017-12-23 DIAGNOSIS — I951 Orthostatic hypotension: Secondary | ICD-10-CM | POA: Diagnosis not present

## 2017-12-23 DIAGNOSIS — I252 Old myocardial infarction: Secondary | ICD-10-CM

## 2017-12-23 DIAGNOSIS — D62 Acute posthemorrhagic anemia: Secondary | ICD-10-CM | POA: Diagnosis not present

## 2017-12-23 DIAGNOSIS — N183 Chronic kidney disease, stage 3 (moderate): Secondary | ICD-10-CM | POA: Diagnosis not present

## 2017-12-23 DIAGNOSIS — N289 Disorder of kidney and ureter, unspecified: Secondary | ICD-10-CM | POA: Diagnosis not present

## 2017-12-23 LAB — BASIC METABOLIC PANEL
ANION GAP: 13 (ref 5–15)
BUN: 23 mg/dL (ref 8–23)
CHLORIDE: 98 mmol/L (ref 98–111)
CO2: 27 mmol/L (ref 22–32)
Calcium: 8.6 mg/dL — ABNORMAL LOW (ref 8.9–10.3)
Creatinine, Ser: 1.4 mg/dL — ABNORMAL HIGH (ref 0.61–1.24)
GFR, EST AFRICAN AMERICAN: 56 mL/min — AB (ref >60)
GFR, EST NON AFRICAN AMERICAN: 49 mL/min — AB (ref >60)
Glucose, Bld: 112 mg/dL — ABNORMAL HIGH (ref 70–99)
POTASSIUM: 4 mmol/L (ref 3.5–5.1)
Sodium: 138 mmol/L (ref 135–145)

## 2017-12-23 LAB — CBC WITH DIFFERENTIAL/PLATELET
BASOS ABS: 0 10*3/uL (ref 0.0–0.1)
Basophils Relative: 0 %
Eosinophils Absolute: 0.1 10*3/uL (ref 0.0–0.7)
Eosinophils Relative: 1 %
HEMATOCRIT: 38.2 % — AB (ref 39.0–52.0)
HEMOGLOBIN: 13.1 g/dL (ref 13.0–17.0)
LYMPHS ABS: 1.8 10*3/uL (ref 0.7–4.0)
LYMPHS PCT: 14 %
MCH: 31.9 pg (ref 26.0–34.0)
MCHC: 34.3 g/dL (ref 30.0–36.0)
MCV: 92.9 fL (ref 78.0–100.0)
Monocytes Absolute: 1.2 10*3/uL — ABNORMAL HIGH (ref 0.1–1.0)
Monocytes Relative: 9 %
NEUTROS ABS: 9.4 10*3/uL — AB (ref 1.7–7.7)
NEUTROS PCT: 76 %
PLATELETS: 350 10*3/uL (ref 150–400)
RBC: 4.11 MIL/uL — AB (ref 4.22–5.81)
RDW: 13.9 % (ref 11.5–15.5)
WBC: 12.5 10*3/uL — AB (ref 4.0–10.5)

## 2017-12-23 LAB — I-STAT TROPONIN, ED: TROPONIN I, POC: 0 ng/mL (ref 0.00–0.08)

## 2017-12-23 LAB — CK: CK TOTAL: 190 U/L (ref 49–397)

## 2017-12-23 LAB — PROTIME-INR
INR: 2.45
Prothrombin Time: 26.4 seconds — ABNORMAL HIGH (ref 11.4–15.2)

## 2017-12-23 MED ORDER — MORPHINE SULFATE (PF) 4 MG/ML IV SOLN
4.0000 mg | Freq: Once | INTRAVENOUS | Status: AC
  Administered 2017-12-23: 4 mg via INTRAVENOUS
  Filled 2017-12-23: qty 1

## 2017-12-23 MED ORDER — HYDROCODONE-ACETAMINOPHEN 5-325 MG PO TABS
2.0000 | ORAL_TABLET | Freq: Once | ORAL | Status: AC
  Administered 2017-12-24: 2 via ORAL
  Filled 2017-12-23: qty 2

## 2017-12-23 MED ORDER — HYDROCODONE-ACETAMINOPHEN 5-325 MG PO TABS: 1.0000 | ORAL_TABLET | Freq: Four times a day (QID) | ORAL | 0 refills | Status: DC | PRN

## 2017-12-23 MED ORDER — IOPAMIDOL (ISOVUE-370) INJECTION 76%
100.0000 mL | Freq: Once | INTRAVENOUS | Status: AC | PRN
  Administered 2017-12-23: 100 mL via INTRAVENOUS

## 2017-12-23 MED ORDER — IOPAMIDOL (ISOVUE-370) INJECTION 76%
INTRAVENOUS | Status: AC
  Filled 2017-12-23: qty 100
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
## 2017-12-23 NOTE — Discharge Instructions (Signed)
Emergency department for acute onset of right thigh pain.  You had blood work and a CAT scan and the most significant finding was that you have a bleeding episode in the muscle of your right thigh.  This will cause a lot of pain and need time to heal.  You may benefit from an Ace wrap or ice to the area.  We are prescribing some medication to help with pain.  Will be important for you to follow-up with your primary care doctor for further evaluation.  Please return if any worsening symptoms.
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
## 2017-12-24 ENCOUNTER — Emergency Department (HOSPITAL_COMMUNITY): Payer: Medicare Other

## 2017-12-24 ENCOUNTER — Observation Stay (HOSPITAL_COMMUNITY)
Admission: EM | Admit: 2017-12-24 | Discharge: 2017-12-30 | Disposition: A | Discharge status: Skilled Nursing Facility | Payer: Medicare Other | Attending: Family Medicine | Admitting: Family Medicine

## 2017-12-24 ENCOUNTER — Other Ambulatory Visit: Payer: Self-pay

## 2017-12-24 ENCOUNTER — Encounter: Payer: Self-pay | Admitting: Nurse Practitioner

## 2017-12-24 DIAGNOSIS — G4733 Obstructive sleep apnea (adult) (pediatric): Secondary | ICD-10-CM

## 2017-12-24 DIAGNOSIS — Z823 Family history of stroke: Secondary | ICD-10-CM | POA: Insufficient documentation

## 2017-12-24 DIAGNOSIS — Z9119 Patient's noncompliance with other medical treatment and regimen: Secondary | ICD-10-CM | POA: Insufficient documentation

## 2017-12-24 DIAGNOSIS — Z23 Encounter for immunization: Secondary | ICD-10-CM | POA: Insufficient documentation

## 2017-12-24 DIAGNOSIS — Z952 Presence of prosthetic heart valve: Secondary | ICD-10-CM | POA: Insufficient documentation

## 2017-12-24 DIAGNOSIS — E785 Hyperlipidemia, unspecified: Secondary | ICD-10-CM | POA: Insufficient documentation

## 2017-12-24 DIAGNOSIS — W19XXXA Unspecified fall, initial encounter: Secondary | ICD-10-CM

## 2017-12-24 DIAGNOSIS — M79604 Pain in right leg: Secondary | ICD-10-CM | POA: Diagnosis present

## 2017-12-24 DIAGNOSIS — Z955 Presence of coronary angioplasty implant and graft: Secondary | ICD-10-CM | POA: Insufficient documentation

## 2017-12-24 DIAGNOSIS — I6782 Cerebral ischemia: Secondary | ICD-10-CM | POA: Insufficient documentation

## 2017-12-24 DIAGNOSIS — Z79899 Other long term (current) drug therapy: Secondary | ICD-10-CM | POA: Insufficient documentation

## 2017-12-24 DIAGNOSIS — R079 Chest pain, unspecified: Secondary | ICD-10-CM

## 2017-12-24 DIAGNOSIS — K8020 Calculus of gallbladder without cholecystitis without obstruction: Secondary | ICD-10-CM | POA: Insufficient documentation

## 2017-12-24 DIAGNOSIS — Y9301 Activity, walking, marching and hiking: Secondary | ICD-10-CM | POA: Insufficient documentation

## 2017-12-24 DIAGNOSIS — Z9889 Other specified postprocedural states: Secondary | ICD-10-CM | POA: Insufficient documentation

## 2017-12-24 DIAGNOSIS — I5032 Chronic diastolic (congestive) heart failure: Secondary | ICD-10-CM | POA: Insufficient documentation

## 2017-12-24 DIAGNOSIS — E039 Hypothyroidism, unspecified: Secondary | ICD-10-CM | POA: Insufficient documentation

## 2017-12-24 DIAGNOSIS — I130 Hypertensive heart and chronic kidney disease with heart failure and stage 1 through stage 4 chronic kidney disease, or unspecified chronic kidney disease: Secondary | ICD-10-CM | POA: Insufficient documentation

## 2017-12-24 DIAGNOSIS — I69351 Hemiplegia and hemiparesis following cerebral infarction affecting right dominant side: Secondary | ICD-10-CM | POA: Insufficient documentation

## 2017-12-24 DIAGNOSIS — G629 Polyneuropathy, unspecified: Secondary | ICD-10-CM | POA: Insufficient documentation

## 2017-12-24 DIAGNOSIS — I4891 Unspecified atrial fibrillation: Secondary | ICD-10-CM | POA: Insufficient documentation

## 2017-12-24 DIAGNOSIS — M25461 Effusion, right knee: Secondary | ICD-10-CM | POA: Insufficient documentation

## 2017-12-24 DIAGNOSIS — S7011XD Contusion of right thigh, subsequent encounter: Secondary | ICD-10-CM

## 2017-12-24 DIAGNOSIS — Z8249 Family history of ischemic heart disease and other diseases of the circulatory system: Secondary | ICD-10-CM | POA: Insufficient documentation

## 2017-12-24 DIAGNOSIS — Z87891 Personal history of nicotine dependence: Secondary | ICD-10-CM | POA: Insufficient documentation

## 2017-12-24 DIAGNOSIS — I10 Essential (primary) hypertension: Secondary | ICD-10-CM | POA: Diagnosis present

## 2017-12-24 DIAGNOSIS — D649 Anemia, unspecified: Secondary | ICD-10-CM

## 2017-12-24 DIAGNOSIS — Z6841 Body Mass Index (BMI) 40.0 and over, adult: Secondary | ICD-10-CM | POA: Insufficient documentation

## 2017-12-24 DIAGNOSIS — F329 Major depressive disorder, single episode, unspecified: Secondary | ICD-10-CM | POA: Insufficient documentation

## 2017-12-24 DIAGNOSIS — E66813 Obesity, class 3: Secondary | ICD-10-CM | POA: Diagnosis present

## 2017-12-24 DIAGNOSIS — R609 Edema, unspecified: Secondary | ICD-10-CM | POA: Insufficient documentation

## 2017-12-24 DIAGNOSIS — Z79890 Hormone replacement therapy (postmenopausal): Secondary | ICD-10-CM | POA: Insufficient documentation

## 2017-12-24 DIAGNOSIS — Y92009 Unspecified place in unspecified non-institutional (private) residence as the place of occurrence of the external cause: Secondary | ICD-10-CM

## 2017-12-24 DIAGNOSIS — S7011XA Contusion of right thigh, initial encounter: Secondary | ICD-10-CM | POA: Insufficient documentation

## 2017-12-24 DIAGNOSIS — R1011 Right upper quadrant pain: Secondary | ICD-10-CM

## 2017-12-24 DIAGNOSIS — Z9989 Dependence on other enabling machines and devices: Secondary | ICD-10-CM

## 2017-12-24 DIAGNOSIS — S7010XA Contusion of unspecified thigh, initial encounter: Secondary | ICD-10-CM | POA: Diagnosis present

## 2017-12-24 DIAGNOSIS — I700 Atherosclerosis of aorta: Secondary | ICD-10-CM | POA: Insufficient documentation

## 2017-12-24 DIAGNOSIS — I6930 Unspecified sequelae of cerebral infarction: Secondary | ICD-10-CM

## 2017-12-24 DIAGNOSIS — R269 Unspecified abnormalities of gait and mobility: Principal | ICD-10-CM | POA: Insufficient documentation

## 2017-12-24 DIAGNOSIS — N183 Chronic kidney disease, stage 3 (moderate): Secondary | ICD-10-CM | POA: Insufficient documentation

## 2017-12-24 DIAGNOSIS — G319 Degenerative disease of nervous system, unspecified: Secondary | ICD-10-CM | POA: Insufficient documentation

## 2017-12-24 DIAGNOSIS — I951 Orthostatic hypotension: Secondary | ICD-10-CM | POA: Diagnosis present

## 2017-12-24 DIAGNOSIS — Z7901 Long term (current) use of anticoagulants: Secondary | ICD-10-CM

## 2017-12-24 DIAGNOSIS — I480 Paroxysmal atrial fibrillation: Secondary | ICD-10-CM | POA: Insufficient documentation

## 2017-12-24 DIAGNOSIS — D62 Acute posthemorrhagic anemia: Secondary | ICD-10-CM | POA: Insufficient documentation

## 2017-12-24 DIAGNOSIS — N289 Disorder of kidney and ureter, unspecified: Secondary | ICD-10-CM

## 2017-12-24 LAB — CBC
HCT: 37.2 % — ABNORMAL LOW (ref 39.0–52.0)
HEMOGLOBIN: 12.1 g/dL — AB (ref 13.0–17.0)
MCH: 31.5 pg (ref 26.0–34.0)
MCHC: 32.5 g/dL (ref 30.0–36.0)
MCV: 96.9 fL (ref 78.0–100.0)
PLATELETS: 310 10*3/uL (ref 150–400)
RBC: 3.84 MIL/uL — AB (ref 4.22–5.81)
RDW: 14.1 % (ref 11.5–15.5)
WBC: 13.4 10*3/uL — AB (ref 4.0–10.5)

## 2017-12-24 LAB — COMPREHENSIVE METABOLIC PANEL
ALBUMIN: 4 g/dL (ref 3.5–5.0)
ALK PHOS: 102 U/L (ref 38–126)
ALT: 24 U/L (ref 0–44)
ANION GAP: 14 (ref 5–15)
AST: 24 U/L (ref 15–41)
BILIRUBIN TOTAL: 1.3 mg/dL — AB (ref 0.3–1.2)
BUN: 19 mg/dL (ref 8–23)
CALCIUM: 8.7 mg/dL — AB (ref 8.9–10.3)
CO2: 24 mmol/L (ref 22–32)
CREATININE: 1.51 mg/dL — AB (ref 0.61–1.24)
Chloride: 98 mmol/L (ref 98–111)
GFR calc Af Amer: 51 mL/min — ABNORMAL LOW (ref >60)
GFR, EST NON AFRICAN AMERICAN: 44 mL/min — AB (ref >60)
GLUCOSE: 147 mg/dL — AB (ref 70–99)
Potassium: 4 mmol/L (ref 3.5–5.1)
Sodium: 136 mmol/L (ref 135–145)
TOTAL PROTEIN: 7.7 g/dL (ref 6.5–8.1)

## 2017-12-24 LAB — I-STAT TROPONIN, ED
TROPONIN I, POC: 0 ng/mL (ref 0.00–0.08)
TROPONIN I, POC: 0.02 ng/mL (ref 0.00–0.08)

## 2017-12-24 LAB — LIPASE, BLOOD: Lipase: 39 U/L (ref 11–51)

## 2017-12-24 LAB — PROTIME-INR
INR: 2.02
PROTHROMBIN TIME: 22.7 s — AB (ref 11.4–15.2)

## 2017-12-24 MED ORDER — ONDANSETRON 4 MG PO TBDP
8.0000 mg | ORAL_TABLET | Freq: Once | ORAL | Status: AC
  Administered 2017-12-25: 8 mg via ORAL
  Filled 2017-12-24 (×2): qty 2

## 2017-12-24 MED ORDER — HYDROCODONE-ACETAMINOPHEN 5-325 MG PO TABS
1.0000 | ORAL_TABLET | Freq: Once | ORAL | Status: AC
  Administered 2017-12-25: 1 via ORAL
  Filled 2017-12-24: qty 1

## 2017-12-24 NOTE — ED Notes (Signed)
Report called to [facility]  

## 2017-12-24 NOTE — ED Provider Notes (Signed)
Patient placed in Quick Look pathway, seen and evaluated   Chief Complaint: fall  HPI:   Marygrace DroughtGeorge O Molden is a morbidly obese 72 y.o. male with hx of A-fib, CHF, CVA, HTN and multiple other medical problems who presents to the ED via EMS s/p fall. Patient reports he was walking with his walker and fell. He tried to pull himself up with the walker and fell again. He finally pulled himself up on a chair and called 911. Patient reports pain in the mid upper chest, RUQ of his abdomen and right leg pain. Patient reports he has had several fall and is on blood thinners. He reports knots in his abdomen and chest area after the falls. [facility] and d/c home with hydrocodone.   ROS: M/S: chest wall pain, right leg pain  Skin: bruising  Physical Exam:  BP 108/68 (BP Location: Left Arm)   Pulse 88   Temp 99.3 F (37.4 C) (Oral)   Resp 18   Ht 6\' 2"  (1.88 m)   Wt 105.2 kg   SpO2 100%   BMI 29.79 kg/m    Gen: No distress  Neuro: Awake and Alert  Skin: multiple areas of ecchymosis  Chest: tender with palpation upper mid chest     Initiation of care has begun. The patient has been counseled on the process, plan, and necessity for staying for the completion/evaluation, and the remainder of the medical screening examination    Janne Napoleoneese, Hope M, NP 12/24/17 Brooke Pace1957    Dione BoozeGlick, David, MD 12/24/17 [phone]

## 2017-12-24 NOTE — ED Notes (Signed)
Pt continues to tell everyone in waiting room that he is going to pass out and he needs help. He has been brought back to triage to be re-assesed multiple times.
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
## 2017-12-24 NOTE — ED Triage Notes (Signed)
Pt arrived [facility] from home for c/o multiple falls, RUQ pain, R leg pain. Per [facility] sent [facility].

## 2017-12-24 NOTE — ED Notes (Signed)
EKG taken by NT , reports chest pain while at waiting area , process/delay explained to pt. respirations unlabored . Pt. reevaluated  by NP at triage .

## 2017-12-25 ENCOUNTER — Encounter (HOSPITAL_COMMUNITY): Payer: Self-pay | Admitting: Internal Medicine

## 2017-12-25 ENCOUNTER — Observation Stay (HOSPITAL_COMMUNITY): Payer: Medicare Other

## 2017-12-25 DIAGNOSIS — Z7901 Long term (current) use of anticoagulants: Secondary | ICD-10-CM

## 2017-12-25 DIAGNOSIS — D649 Anemia, unspecified: Secondary | ICD-10-CM

## 2017-12-25 DIAGNOSIS — W19XXXA Unspecified fall, initial encounter: Secondary | ICD-10-CM

## 2017-12-25 DIAGNOSIS — I951 Orthostatic hypotension: Secondary | ICD-10-CM | POA: Diagnosis not present

## 2017-12-25 DIAGNOSIS — M79604 Pain in right leg: Secondary | ICD-10-CM | POA: Diagnosis not present

## 2017-12-25 DIAGNOSIS — Y92009 Unspecified place in unspecified non-institutional (private) residence as the place of occurrence of the external cause: Secondary | ICD-10-CM

## 2017-12-25 DIAGNOSIS — R079 Chest pain, unspecified: Secondary | ICD-10-CM | POA: Diagnosis not present

## 2017-12-25 LAB — TYPE AND SCREEN
ABO/RH(D): O POS
Antibody Screen: NEGATIVE

## 2017-12-25 LAB — URINALYSIS, ROUTINE W REFLEX MICROSCOPIC
BILIRUBIN URINE: NEGATIVE
GLUCOSE, UA: NEGATIVE mg/dL
KETONES UR: 5 mg/dL — AB
LEUKOCYTES UA: NEGATIVE
Nitrite: NEGATIVE
PH: 5 (ref 5.0–8.0)
Protein, ur: NEGATIVE mg/dL
SPECIFIC GRAVITY, URINE: 1.025 (ref 1.005–1.030)

## 2017-12-25 LAB — BASIC METABOLIC PANEL
Anion gap: 14 (ref 5–15)
BUN: 22 mg/dL (ref 8–23)
CO2: 26 mmol/L (ref 22–32)
Calcium: 8.1 mg/dL — ABNORMAL LOW (ref 8.9–10.3)
Chloride: 99 mmol/L (ref 98–111)
Creatinine, Ser: 1.62 mg/dL — ABNORMAL HIGH (ref 0.61–1.24)
GFR calc Af Amer: 47 mL/min — ABNORMAL LOW (ref >60)
GFR calc non Af Amer: 41 mL/min — ABNORMAL LOW (ref >60)
Glucose, Bld: 142 mg/dL — ABNORMAL HIGH (ref 70–99)
Potassium: 3.7 mmol/L (ref 3.5–5.1)
Sodium: 139 mmol/L (ref 135–145)

## 2017-12-25 LAB — HEPATIC FUNCTION PANEL
ALT: 21 U/L (ref 0–44)
AST: 22 U/L (ref 15–41)
Albumin: 3.5 g/dL (ref 3.5–5.0)
Alkaline Phosphatase: 83 U/L (ref 38–126)
BILIRUBIN DIRECT: 0.3 mg/dL — AB (ref 0.0–0.2)
BILIRUBIN TOTAL: 1.4 mg/dL — AB (ref 0.3–1.2)
Indirect Bilirubin: 1.1 mg/dL — ABNORMAL HIGH (ref 0.3–0.9)
Total Protein: 6.5 g/dL (ref 6.5–8.1)

## 2017-12-25 LAB — CBC WITH DIFFERENTIAL/PLATELET
Abs Immature Granulocytes: 0.1 10*3/uL (ref 0.0–0.1)
Basophils Absolute: 0 10*3/uL (ref 0.0–0.1)
Basophils Relative: 0 %
EOS ABS: 0 10*3/uL (ref 0.0–0.7)
EOS PCT: 0 %
HEMATOCRIT: 31.6 % — AB (ref 39.0–52.0)
Hemoglobin: 10.3 g/dL — ABNORMAL LOW (ref 13.0–17.0)
IMMATURE GRANULOCYTES: 1 %
LYMPHS ABS: 1.6 10*3/uL (ref 0.7–4.0)
Lymphocytes Relative: 14 %
MCH: 31.4 pg (ref 26.0–34.0)
MCHC: 32.6 g/dL (ref 30.0–36.0)
MCV: 96.3 fL (ref 78.0–100.0)
Monocytes Absolute: 1.6 10*3/uL — ABNORMAL HIGH (ref 0.1–1.0)
Monocytes Relative: 14 %
Neutro Abs: 7.8 10*3/uL — ABNORMAL HIGH (ref 1.7–7.7)
Neutrophils Relative %: 71 %
Platelets: 242 10*3/uL (ref 150–400)
RBC: 3.28 MIL/uL — ABNORMAL LOW (ref 4.22–5.81)
RDW: 14.1 % (ref 11.5–15.5)
WBC: 11 10*3/uL — ABNORMAL HIGH (ref 4.0–10.5)

## 2017-12-25 LAB — CBC
HEMATOCRIT: 32.1 % — AB (ref 39.0–52.0)
Hemoglobin: 10.4 g/dL — ABNORMAL LOW (ref 13.0–17.0)
MCH: 31.5 pg (ref 26.0–34.0)
MCHC: 32.4 g/dL (ref 30.0–36.0)
MCV: 97.3 fL (ref 78.0–100.0)
Platelets: 249 10*3/uL (ref 150–400)
RBC: 3.3 MIL/uL — ABNORMAL LOW (ref 4.22–5.81)
RDW: 14.2 % (ref 11.5–15.5)
WBC: 11.9 10*3/uL — ABNORMAL HIGH (ref 4.0–10.5)

## 2017-12-25 LAB — TROPONIN I
Troponin I: <0.03 ng/mL (ref <0.03)
Troponin I: <0.03 ng/mL (ref <0.03)
Troponin I: <0.03 ng/mL (ref <0.03)

## 2017-12-25 LAB — MAGNESIUM: Magnesium: 2.9 mg/dL — ABNORMAL HIGH (ref 1.7–2.4)

## 2017-12-25 MED ORDER — LEVOTHYROXINE SODIUM 100 MCG PO TABS
100.0000 ug | ORAL_TABLET | Freq: Every day | ORAL | Status: DC
  Administered 2017-12-25 – 2017-12-30 (×6): 100 ug via ORAL
  Filled 2017-12-25 (×8): qty 1

## 2017-12-25 MED ORDER — GABAPENTIN 400 MG PO CAPS
800.0000 mg | ORAL_CAPSULE | Freq: Every day | ORAL | Status: DC
  Administered 2017-12-25 – 2017-12-29 (×5): 800 mg via ORAL
  Filled 2017-12-25 (×5): qty 2

## 2017-12-25 MED ORDER — WARFARIN - PHARMACIST DOSING INPATIENT
Freq: Every day | Status: DC
  Administered 2017-12-27: 17:00:00

## 2017-12-25 MED ORDER — ONDANSETRON HCL 4 MG PO TABS: 4.0000 mg | ORAL_TABLET | Freq: Four times a day (QID) | ORAL | Status: DC | PRN

## 2017-12-25 MED ORDER — FLUTICASONE PROPIONATE 50 MCG/ACT NA SUSP: 1.0000 | Freq: Every day | NASAL | Status: DC | PRN

## 2017-12-25 MED ORDER — DULOXETINE HCL 60 MG PO CPEP
60.0000 mg | ORAL_CAPSULE | Freq: Every day | ORAL | Status: DC
  Administered 2017-12-25 – 2017-12-30 (×6): 60 mg via ORAL
  Filled 2017-12-25 (×6): qty 1

## 2017-12-25 MED ORDER — ZOLPIDEM TARTRATE 5 MG PO TABS
5.0000 mg | ORAL_TABLET | Freq: Every evening | ORAL | Status: DC | PRN
  Administered 2017-12-28 – 2017-12-29 (×2): 5 mg via ORAL
  Filled 2017-12-25 (×2): qty 1

## 2017-12-25 MED ORDER — ATORVASTATIN CALCIUM 40 MG PO TABS
40.0000 mg | ORAL_TABLET | Freq: Every day | ORAL | Status: DC
  Administered 2017-12-25 – 2017-12-29 (×5): 40 mg via ORAL
  Filled 2017-12-25 (×5): qty 1

## 2017-12-25 MED ORDER — ACETAMINOPHEN 325 MG PO TABS
650.0000 mg | ORAL_TABLET | Freq: Four times a day (QID) | ORAL | Status: DC | PRN
  Administered 2017-12-25 – 2017-12-30 (×5): 650 mg via ORAL
  Filled 2017-12-25 (×5): qty 2

## 2017-12-25 MED ORDER — LIDOCAINE 5 % EX PTCH
1.0000 | MEDICATED_PATCH | CUTANEOUS | Status: DC
  Administered 2017-12-25 – 2017-12-26 (×2): 1 via TRANSDERMAL
  Filled 2017-12-25 (×2): qty 1

## 2017-12-25 MED ORDER — NITROGLYCERIN 0.4 MG SL SUBL: 0.4000 mg | SUBLINGUAL_TABLET | SUBLINGUAL | Status: DC | PRN

## 2017-12-25 MED ORDER — METOPROLOL TARTRATE 25 MG PO TABS
25.0000 mg | ORAL_TABLET | Freq: Two times a day (BID) | ORAL | Status: DC
  Administered 2017-12-25 – 2017-12-30 (×9): 25 mg via ORAL
  Filled 2017-12-25 (×11): qty 1

## 2017-12-25 MED ORDER — METOPROLOL TARTRATE 25 MG PO TABS
25.0000 mg | ORAL_TABLET | Freq: Two times a day (BID) | ORAL | Status: DC
  Administered 2017-12-25: 25 mg via ORAL
  Filled 2017-12-25: qty 1

## 2017-12-25 MED ORDER — AMIODARONE HCL 200 MG PO TABS
200.0000 mg | ORAL_TABLET | Freq: Every day | ORAL | Status: DC
  Administered 2017-12-25 – 2017-12-30 (×6): 200 mg via ORAL
  Filled 2017-12-25 (×6): qty 1

## 2017-12-25 MED ORDER — ALPRAZOLAM 0.25 MG PO TABS
0.2500 mg | ORAL_TABLET | Freq: Three times a day (TID) | ORAL | Status: DC | PRN
  Administered 2017-12-25 – 2017-12-29 (×2): 0.25 mg via ORAL
  Filled 2017-12-25 (×2): qty 1

## 2017-12-25 MED ORDER — BISACODYL 5 MG PO TBEC: 5.0000 mg | DELAYED_RELEASE_TABLET | Freq: Every day | ORAL | Status: DC | PRN

## 2017-12-25 MED ORDER — INFLUENZA VAC SPLIT HIGH-DOSE 0.5 ML IM SUSY
0.5000 mL | PREFILLED_SYRINGE | INTRAMUSCULAR | Status: AC
  Administered 2017-12-26: 0.5 mL via INTRAMUSCULAR
  Filled 2017-12-25: qty 0.5

## 2017-12-25 MED ORDER — ACETAMINOPHEN 650 MG RE SUPP: 650.0000 mg | Freq: Four times a day (QID) | RECTAL | Status: DC | PRN

## 2017-12-25 MED ORDER — HYDROCODONE-ACETAMINOPHEN 5-325 MG PO TABS
1.0000 | ORAL_TABLET | Freq: Four times a day (QID) | ORAL | Status: DC | PRN
  Administered 2017-12-25 – 2017-12-29 (×13): 2 via ORAL
  Filled 2017-12-25 (×15): qty 2

## 2017-12-25 MED ORDER — WARFARIN SODIUM 10 MG PO TABS: 10.0000 mg | ORAL_TABLET | Freq: Once | ORAL | Status: DC

## 2017-12-25 MED ORDER — PANTOPRAZOLE SODIUM 40 MG PO TBEC
40.0000 mg | DELAYED_RELEASE_TABLET | Freq: Every day | ORAL | Status: DC
  Administered 2017-12-25 – 2017-12-30 (×6): 40 mg via ORAL
  Filled 2017-12-25 (×6): qty 1

## 2017-12-25 MED ORDER — ONDANSETRON HCL 4 MG/2ML IJ SOLN
4.0000 mg | Freq: Four times a day (QID) | INTRAMUSCULAR | Status: DC | PRN
  Administered 2017-12-28: 4 mg via INTRAVENOUS
  Filled 2017-12-25: qty 2

## 2017-12-25 NOTE — Progress Notes (Signed)
Per PA, VS are looking good, PRN Xanax for anxiety and follow up CBC.  Jasun Gasparini, RN

## 2017-12-25 NOTE — Progress Notes (Signed)
Contacted pharmacy about Coumadin to confirm that we are skipping dose today. Per pharmacy do not give Cumadin tonight.  Pharmacy will  follow up tomorrow.  Mashayla Lavin, RN

## 2017-12-25 NOTE — Progress Notes (Signed)
ANTICOAGULATION CONSULT NOTE - Initial Consult  Pharmacy Consult for Coumadin Indication: atrial fibrillation  No Known Allergies  Patient Measurements: Height: 6\' 2"  (188 cm) Weight: (!) 333 lb 3.2 oz (151.1 kg) IBW/kg (Calculated) : 82.2  Vital Signs: Temp: 98.2 F (36.8 C) (09/20 0250) Temp Source: Oral (09/20 0250) BP: 111/47 (09/20 0250) Pulse Rate: 75 (09/20 0250)  Labs: Recent Labs    12/23/17 1851 12/24/17 1814 12/24/17 2324  HGB 13.1 12.1*  --   HCT 38.2* 37.2*  --   PLT 350 310  --   LABPROT 26.4*  --  22.7*  INR 2.45  --  2.02  CREATININE 1.40* 1.51*  --   CKTOTAL 190  --   --     Estimated Creatinine Clearance: 68.7 mL/min (A) (by C-G formula based on SCr of 1.51 mg/dL (H)).   Medical History: Past Medical History:  Diagnosis Date  . Atrial fibrillation (HCC)    pt on Eliquis  . Chest pain   . CHF (congestive heart failure) (HCC)   . Depression   . Edema   . HTN (hypertension)   . Hyperlipidemia   . Hypothyroidism   . Neuropathy   . Obesity   . Renal insufficiency 12/04/2017  . SOB (shortness of breath)   . Stroke [facility](HCC)     Medications:  Medications Prior to Admission  Medication Sig Dispense Refill Last Dose  . acetaminophen (TYLENOL) 325 MG tablet Take 2 tablets (650 mg total) by mouth every 4 (four) hours as needed for headache or mild pain.   Past Week at Unknown time  . amiodarone (PACERONE) 200 MG tablet Take 1 tablet (200 mg total) by mouth daily. 90 tablet 1 12/23/2017 at Unknown time  . atorvastatin (LIPITOR) 40 MG tablet Take 1 tablet (40 mg total) by mouth daily at 6 PM. (Patient taking differently: Take 40 mg by mouth at bedtime. ) 30 tablet 0 12/22/2017 at Unknown time  . bisacodyl (DULCOLAX) 5 MG EC tablet Take 5 mg by mouth daily as needed for moderate constipation.   Past Month at Unknown time  . DULoxetine (CYMBALTA) 60 MG capsule Take 60 mg by mouth daily.   12/23/2017 at Unknown time  . fluticasone (FLONASE) 50 MCG/ACT nasal  spray Place 1 spray into both nostrils daily as needed for allergies.   10 Past Week at Unknown time  . furosemide (LASIX) 80 MG tablet Take 120 mg by mouth 2 (two) times daily.   12/23/2017 at Unknown time  . gabapentin (NEURONTIN) 400 MG capsule Take 800 mg by mouth at bedtime.    12/22/2017 at Unknown time  . levothyroxine (SYNTHROID, LEVOTHROID) 100 MCG tablet Take 100 mcg by mouth daily.    12/23/2017 at Unknown time  . metoprolol tartrate (LOPRESSOR) 25 MG tablet Take 25 mg by mouth 2 (two) times daily.    12/23/2017 at 0800  . nitroGLYCERIN (NITROSTAT) 0.4 MG SL tablet Place 1 tablet (0.4 mg total) under the tongue every 5 (five) minutes as needed for chest pain. 20 tablet 1 unknown  . omeprazole (PRILOSEC) 40 MG capsule Take 40 mg by mouth daily as needed (indigestion).    Past Week at Unknown time  . polyethylene glycol powder (GLYCOLAX/MIRALAX) powder Take 17 g by mouth daily. (Patient taking differently: Take 17 g by mouth daily as needed (constipation). ) 255 g 0 Past Week at Unknown time  . traMADol (ULTRAM) 50 MG tablet Take 100 mg by mouth 3 (three) times daily as needed  for moderate pain.    Past Week at Unknown time  . warfarin (COUMADIN) 5 MG tablet Take 1.5 tablets (7.5 mg total) by mouth daily. Take as directed by PCP   12/23/2017 at 0900  . zolpidem (AMBIEN) 5 MG tablet Take 5 mg by mouth at bedtime as needed for sleep.    Past Week at Unknown time  . furosemide (LASIX) 40 MG tablet Take 3 tablets (120 mg total) by mouth daily. (Patient not taking: Reported on 12/23/2017) 90 tablet 11 Not Taking at Unknown time  . HYDROcodone-acetaminophen (NORCO/VICODIN) 5-325 MG tablet Take 1-2 tablets by mouth every 6 (six) hours as needed for severe pain. 10 tablet 0    Scheduled:  . amiodarone  200 mg Oral Daily  . atorvastatin  40 mg Oral QHS  . DULoxetine  60 mg Oral Daily  . gabapentin  800 mg Oral QHS  . [START ON 12/26/2017] Influenza vac split quadrivalent PF  0.5 mL Intramuscular  Tomorrow-1000  . levothyroxine  100 mcg Oral QAC breakfast  . metoprolol tartrate  25 mg Oral BID  . pantoprazole  40 mg Oral Daily    Assessment: 72yo male c/o falling multiple times and resultant RLE and abdominal pain, admitted for further w/u, to continue Coumadin for Afib; current INR at goal w/ last dose of Coumadin taken 9/18.  Goal of Therapy:  INR 2-3 Monitor platelets by anticoagulation protocol: Yes   Plan:  Will give slightly boosted Coumadin dose of 10mg  x1 today since pt missed 9/19 dose and monitor INR daily.  Vernard Gambles, PharmD, BCPS  12/25/2017,5:21 AM
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
## 2017-12-25 NOTE — Plan of Care (Signed)
?  Problem: Clinical Measurements: ?Goal: Cardiovascular complication will be avoided ?Outcome: Progressing ?  ?Problem: Nutrition: ?Goal: Adequate nutrition will be maintained ?Outcome: Progressing ?  ?Problem: Elimination: ?Goal: Will not experience complications related to bowel motility ?Outcome: Progressing ?  ?

## 2017-12-25 NOTE — Progress Notes (Signed)
Patient arrived to [address] from ED. Patient alert and oriented. Admission assessment complete. CCMD notified. Patient updated on plan of care. Will continue to monitor patient.

## 2017-12-25 NOTE — Progress Notes (Signed)
Patient is c/o of SOB. VS stable. Page PA S.Wertman. Awaiting on call back.   Fruma Africa, RN

## 2017-12-25 NOTE — Care Management Note (Signed)
Case Management Note  Patient Details  Name: Jorge [name] MRN: [id number] Date of Birth: 1945-08-29  Subjective/Objective:      Orthostatic Hypotension             Action/Plan: 12/25/2017 - Noted patient was admitted 3 times in 6 months; patient is active with [facility] as prior to admission; CM will continue to follow for progression of care. Abelino DerrickB Naina Sleeper [facility]N,MHA,BSN [phone]  12/14/2017- Action/Plan: Spoke to pt at bedside. [facility]. Pt will need also need HHPT and SW added to orders. Pt has oxygen ([facility]), cane and CPAP at home. Pt requesting 3n1 bedside commode. [facility] and 3n1. Lives alone. Wants to speak to SW about transportation. His sons work out of town. Dtr in law has small kids in home so assistance is limited. CSW referral for transportation. Dixon BoosA Shavis RN  Expected Discharge Date:  12/28/17               Expected Discharge Plan:  Home w Home Health Services  Discharge planning Services  CM Consult  HH Arranged:  RN, Nurse's Aide, [facility]  [facility]  Status of Service:  In process, will continue to follow  Reola MosherChandler, Pookela Sellin L, RN,MHA,BSN [phone] 12/25/2017, 2:48 PM
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
## 2017-12-26 DIAGNOSIS — Z87891 Personal history of nicotine dependence: Secondary | ICD-10-CM

## 2017-12-26 DIAGNOSIS — I951 Orthostatic hypotension: Secondary | ICD-10-CM | POA: Diagnosis not present

## 2017-12-26 DIAGNOSIS — F419 Anxiety disorder, unspecified: Secondary | ICD-10-CM | POA: Diagnosis not present

## 2017-12-26 DIAGNOSIS — R109 Unspecified abdominal pain: Secondary | ICD-10-CM

## 2017-12-26 LAB — CBC
HEMATOCRIT: 32.8 % — AB (ref 39.0–52.0)
Hemoglobin: 10.6 g/dL — ABNORMAL LOW (ref 13.0–17.0)
MCH: 31.7 pg (ref 26.0–34.0)
MCHC: 32.3 g/dL (ref 30.0–36.0)
MCV: 98.2 fL (ref 78.0–100.0)
Platelets: 228 10*3/uL (ref 150–400)
RBC: 3.34 MIL/uL — ABNORMAL LOW (ref 4.22–5.81)
RDW: 14.1 % (ref 11.5–15.5)
WBC: 8.5 10*3/uL (ref 4.0–10.5)

## 2017-12-26 LAB — BASIC METABOLIC PANEL
Anion gap: 11 (ref 5–15)
BUN: 20 mg/dL (ref 8–23)
CHLORIDE: 99 mmol/L (ref 98–111)
CO2: 27 mmol/L (ref 22–32)
Calcium: 8 mg/dL — ABNORMAL LOW (ref 8.9–10.3)
Creatinine, Ser: 1.28 mg/dL — ABNORMAL HIGH (ref 0.61–1.24)
GFR calc Af Amer: >60 mL/min (ref >60)
GFR calc non Af Amer: 54 mL/min — ABNORMAL LOW (ref >60)
Glucose, Bld: 121 mg/dL — ABNORMAL HIGH (ref 70–99)
POTASSIUM: 3.8 mmol/L (ref 3.5–5.1)
Sodium: 137 mmol/L (ref 135–145)

## 2017-12-26 LAB — PROTIME-INR
INR: 1.88
Prothrombin Time: 21.4 seconds — ABNORMAL HIGH (ref 11.4–15.2)

## 2017-12-26 MED ORDER — LIDOCAINE 5 % EX PTCH
1.0000 | MEDICATED_PATCH | Freq: Two times a day (BID) | CUTANEOUS | Status: DC | PRN
  Administered 2017-12-27: 1 via TRANSDERMAL
  Filled 2017-12-26: qty 1
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
## 2017-12-27 DIAGNOSIS — I951 Orthostatic hypotension: Secondary | ICD-10-CM | POA: Diagnosis not present

## 2017-12-27 LAB — PROTIME-INR
INR: 1.56
Prothrombin Time: 18.6 seconds — ABNORMAL HIGH (ref 11.4–15.2)

## 2017-12-27 MED ORDER — NYSTATIN 100000 UNIT/GM EX CREA
TOPICAL_CREAM | Freq: Two times a day (BID) | CUTANEOUS | Status: DC
  Administered 2017-12-27 – 2017-12-29 (×6): via TOPICAL
  Administered 2017-12-30: 1 via TOPICAL
  Filled 2017-12-27: qty 15

## 2017-12-27 MED ORDER — IBUPROFEN 600 MG PO TABS
600.0000 mg | ORAL_TABLET | Freq: Four times a day (QID) | ORAL | Status: DC | PRN
  Administered 2017-12-27: 600 mg via ORAL
  Filled 2017-12-27: qty 1

## 2017-12-27 MED ORDER — WARFARIN SODIUM 2 MG PO TABS
4.0000 mg | ORAL_TABLET | Freq: Once | ORAL | Status: AC
  Administered 2017-12-27: 4 mg via ORAL
  Filled 2017-12-27: qty 2
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
## 2017-12-27 NOTE — Plan of Care (Signed)
  Problem: Clinical Measurements: Goal: Cardiovascular complication will be avoided Outcome: Progressing   Problem: Pain Managment: Goal: General experience of comfort will improve Outcome: Progressing   Problem: Skin Integrity: Goal: Risk for impaired skin integrity will decrease Outcome: Progressing   

## 2017-12-27 NOTE — Progress Notes (Signed)
ANTICOAGULATION CONSULT NOTE - Initial Consult  Pharmacy Consult for warfarin Indication: atrial fibrillation  No Known Allergies  Patient Measurements: Height: 6\' 2"  (188 cm) Weight: (!) 333 lb (151 kg) IBW/kg (Calculated) : 82.2  Vital Signs: BP: 110/70 (09/22 0827) Pulse Rate: 64 (09/22 0827)  Labs: Recent Labs    12/24/17 1814 12/24/17 2324 12/25/17 0630 12/25/17 1128 12/25/17 1517 12/25/17 1904 12/26/17 0602 12/26/17 0642 12/27/17 0506  HGB 12.1*  --  10.3*  --   --  10.4*  --  10.6*  --   HCT 37.2*  --  31.6*  --   --  32.1*  --  32.8*  --   PLT 310  --  242  --   --  249  --  228  --   LABPROT  --  22.7*  --   --   --   --  21.4*  --  18.6*  INR  --  2.02  --   --   --   --  1.88  --  1.56  CREATININE 1.51*  --  1.62*  --   --   --  1.28*  --   --   TROPONINI  --   --  <0.03 <0.03 <0.03  --   --   --   --     Estimated Creatinine Clearance: 80.9 mL/min (A) (by C-G formula based on SCr of 1.28 mg/dL (H)).   Medical History: Past Medical History:  Diagnosis Date  . Atrial fibrillation (HCC)    pt on Eliquis  . Chest pain   . CHF (congestive heart failure) (HCC)   . Depression   . Edema   . HTN (hypertension)   . Hyperlipidemia   . Hypothyroidism   . Neuropathy   . Obesity   . Renal insufficiency 12/04/2017  . SOB (shortness of breath)   . Stroke [facility](HCC)    Assessment: Jorge [name] is a 72yo male [facility] with increasing pain in the right lower extremity. At the time, a CT angio of the leg demonstrated hematoma, patient was discharged. Patient presented again 9/19 after his "leg gave way" and had a fall.   PTA warfarin has been held in the setting of fall and hematoma.  Pharmacy has now been consulted to restart warfarin at reduced dose due to recent fall/hematoma. PTA regimen 7.5mg  daily.   Goal of Therapy:  INR 2-3 Monitor platelets by anticoagulation protocol: Yes   Plan:  Will give warfarin 4mg  tonight x1 Daily INR, CBC,  monitor for s/sx of bleeding  Thank you for involving pharmacy in this patient's care.  Wendelyn Breslowylan Hanna, PharmD PGY1 Pharmacy Resident Phone: [phone] 12/27/2017 12:55 PM

## 2017-12-27 NOTE — Progress Notes (Signed)
ANTICOAGULATION CONSULT NOTE - Initial Consult  Pharmacy Consult for warfarin Indication: atrial fibrillation  No Known Allergies  Patient Measurements: Height: 6\' 2"  (188 cm) Weight: (!) 333 lb (151 kg) IBW/kg (Calculated) : 82.2  Vital Signs: BP: 110/70 (09/22 0827) Pulse Rate: 64 (09/22 0827)  Labs: Recent Labs    12/24/17 1814 12/24/17 2324 12/25/17 0630 12/25/17 1128 12/25/17 1517 12/25/17 1904 12/26/17 0602 12/26/17 0642 12/27/17 0506  HGB 12.1*  --  10.3*  --   --  10.4*  --  10.6*  --   HCT 37.2*  --  31.6*  --   --  32.1*  --  32.8*  --   PLT 310  --  242  --   --  249  --  228  --   LABPROT  --  22.7*  --   --   --   --  21.4*  --  18.6*  INR  --  2.02  --   --   --   --  1.88  --  1.56  CREATININE 1.51*  --  1.62*  --   --   --  1.28*  --   --   TROPONINI  --   --  <0.03 <0.03 <0.03  --   --   --   --     Estimated Creatinine Clearance: 80.9 mL/min (A) (by C-G formula based on SCr of 1.28 mg/dL (H)).   Medical History: Past Medical History:  Diagnosis Date  . Atrial fibrillation (HCC)    pt on Eliquis  . Chest pain   . CHF (congestive heart failure) (HCC)   . Depression   . Edema   . HTN (hypertension)   . Hyperlipidemia   . Hypothyroidism   . Neuropathy   . Obesity   . Renal insufficiency 12/04/2017  . SOB (shortness of breath)   . Stroke [facility](HCC)    Assessment: Jorge [name] is a 72yo [facility] with increasing pain in the right lower extremity. At the time, a CT angio of the leg demonstrated hematoma, patient was discharged. Patient presented again 9/19 after his "leg gave way" and had a fall.   PTA warfarin has been held in the setting of fall and hematoma. INR currently subtherapeutic at 1.56. Pharmacy has now been consulted to restart warfarin at reduced dose due to recent fall/hematoma. PTA regimen 7.5mg  daily.   Goal of Therapy:  INR 2-3 Monitor platelets by anticoagulation protocol: Yes   Plan:  Will give warfarin  4mg  tonight x1 Daily INR, CBC, monitor for s/sx of bleeding  Thank you for involving pharmacy in this patient's care.  Wendelyn Breslowylan Mete Purdum, PharmD PGY1 Pharmacy Resident Phone: [phone] 12/27/2017 1:10 PM

## 2017-12-27 NOTE — Progress Notes (Signed)
Pt said he found big nodule on his right neck, Nurse was able to palpate the findings, MD aware  Lonia Farberekha, RN

## 2017-12-27 NOTE — Progress Notes (Signed)
Pt  is on low bed but refused to have bed alarm activated, pt was educated on safety plan but continued to refused to have bed alarm on. Pt is alert and oriented. Will continue to do intentional rounding.

## 2017-12-28 DIAGNOSIS — I951 Orthostatic hypotension: Secondary | ICD-10-CM | POA: Diagnosis not present

## 2017-12-28 LAB — PROTIME-INR
INR: 1.28
PROTHROMBIN TIME: 15.8 s — AB (ref 11.4–15.2)

## 2017-12-28 LAB — CBC
HCT: 35.7 % — ABNORMAL LOW (ref 39.0–52.0)
HEMOGLOBIN: 11.4 g/dL — AB (ref 13.0–17.0)
MCH: 31.5 pg (ref 26.0–34.0)
MCHC: 31.9 g/dL (ref 30.0–36.0)
MCV: 98.6 fL (ref 78.0–100.0)
Platelets: 292 10*3/uL (ref 150–400)
RBC: 3.62 MIL/uL — AB (ref 4.22–5.81)
RDW: 14.2 % (ref 11.5–15.5)
WBC: 8.1 10*3/uL (ref 4.0–10.5)

## 2017-12-28 LAB — BASIC METABOLIC PANEL
Anion gap: 11 (ref 5–15)
BUN: 20 mg/dL (ref 8–23)
CHLORIDE: 100 mmol/L (ref 98–111)
CO2: 26 mmol/L (ref 22–32)
Calcium: 8.1 mg/dL — ABNORMAL LOW (ref 8.9–10.3)
Creatinine, Ser: 1.3 mg/dL — ABNORMAL HIGH (ref 0.61–1.24)
GFR calc non Af Amer: 53 mL/min — ABNORMAL LOW (ref >60)
Glucose, Bld: 115 mg/dL — ABNORMAL HIGH (ref 70–99)
POTASSIUM: 5.3 mmol/L — AB (ref 3.5–5.1)
SODIUM: 137 mmol/L (ref 135–145)

## 2017-12-28 LAB — GLUCOSE, CAPILLARY: GLUCOSE-CAPILLARY: 96 mg/dL (ref 70–99)

## 2017-12-28 MED ORDER — WARFARIN SODIUM 2 MG PO TABS
4.0000 mg | ORAL_TABLET | Freq: Once | ORAL | Status: AC
  Administered 2017-12-28: 4 mg via ORAL
  Filled 2017-12-28: qty 2
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
## 2017-12-28 NOTE — Clinical Social Work Note (Signed)
Clinical Social Work Assessment  Patient Details  Name: Jorge [name] MRN: [id number] Date of Birth: 1945/10/19  Date of referral:  12/28/17               Reason for consult:  Facility Placement                Permission sought to share information with:  Facility Industrial/product designerContact Representative Permission granted to share information::  Yes, Verbal Permission Granted  Name::        Agency::  SNFs  Relationship::     Contact Information:     Housing/Transportation Living arrangements for the past 2 months:  Apartment Source of Information:  Patient Patient Interpreter Needed:  None Criminal Activity/Legal Involvement Pertinent to Current Situation/Hospitalization:  No - Comment as needed Significant Relationships:  Adult Children Lives with:  Self Do you feel safe going back to the place where you live?  No Need for family participation in patient care:  No (Coment)  Care giving concerns:  CSW received consult for possible SNF placement at time of discharge. CSW spoke with patient regarding PT recommendation of SNF placement at time of discharge. Patient reported that he lives alone in his apartment and cannot manage his own needs at this time. He reports that his son is a Naval architecttruck driver and is not home very often to assist him. Patient expressed understanding of PT recommendation and is agreeable to SNF placement at time of discharge. CSW to continue to follow and assist with discharge planning needs.   Social Worker assessment / plan:  CSW spoke with patient concerning [facility] before returning home.  Employment status:  Retired Database administratornsurance information:  Managed Medicare PT Recommendations:  Skilled Nursing Facility Information / Referral to community resources:  Skilled Nursing Facility  Patient/Family's Response to care:  Patient recognizes need for rehab before returning home and is agree[place]. Patient reported preference for [facility]. CSW also encouraged patient to apply for Medicaid again (he was denied the first time), especially if he is interested in long term care or personal care services. Patient reported that [facility].   Patient/Family's Understanding of and Emotional Response to Diagnosis, Current Treatment, and Prognosis:  Patient/family is realistic regarding therapy needs and expressed being hopeful for SNF placement. Patient expressed understanding of CSW role and discharge process as well as medical condition. No questions/concerns about plan or treatment.    Emotional Assessment Appearance:  Appears stated age Attitude/Demeanor/Rapport:  Engaged Affect (typically observed):  Accepting, Appropriate Orientation:  Oriented to Self, Oriented to Place, Oriented to  Time, Oriented to Situation Alcohol / Substance use:  Not Applicable Psych involvement (Current and /or in the community):  No (Comment)  Discharge Needs  Concerns to be addressed:  Care Coordination Readmission within the last 30 days:  Yes Current discharge risk:  Dependent with Mobility, Lives alone Barriers to Discharge:  Continued Medical Work up   In[employer] Incadia S Hesham Womac, LCSWA 12/28/2017, 11:58 AM

## 2017-12-28 NOTE — Progress Notes (Signed)
ANTICOAGULATION CONSULT NOTE  Pharmacy Consult for warfarin Indication: atrial fibrillation  No Known Allergies  Patient Measurements: Height: 6\' 2"  (188 cm) Weight: (!) 338 lb (153.3 kg)(BED) IBW/kg (Calculated) : 82.2  Vital Signs: Temp: 97.6 F (36.4 C) (09/23 0946) Temp Source: Oral (09/23 0946) BP: 127/44 (09/23 0946) Pulse Rate: 69 (09/23 0946)  Labs: Recent Labs    12/25/17 1128 12/25/17 1517  12/25/17 1904 12/26/17 0602 12/26/17 0642 12/27/17 0506 12/28/17 0621  HGB  --   --    < > 10.4*  --  10.6*  --  11.4*  HCT  --   --   --  32.1*  --  32.8*  --  35.7*  PLT  --   --   --  249  --  228  --  292  LABPROT  --   --   --   --  21.4*  --  18.6* 15.8*  INR  --   --   --   --  1.88  --  1.56 1.28  CREATININE  --   --   --   --  1.28*  --   --  1.30*  TROPONINI <0.03 <0.03  --   --   --   --   --   --    < > = values in this interval not displayed.    Estimated Creatinine Clearance: 80.4 mL/min (A) (by C-G formula based on SCr of 1.3 mg/dL (H)).    Assessment: Jorge [name] is a 72yo male [facility] with increasing pain in the right lower extremity. At the time, a CT angio of the leg demonstrated hematoma, patient was discharged. Patient presented again 9/19 after his "leg gave way" and had a fall.   PTA warfarin has been held in the setting of fall and hematoma. Pharmacy has now been consulted to restart warfarin at reduced dose due to recent fall/hematoma. PTA regimen 7.5mg  daily.   INR today = 1.28  Goal of Therapy:  INR 2-3 Monitor platelets by anticoagulation protocol: Yes   Plan:  Will give warfarin 4mg  tonight x1 Daily INR, CBC, monitor for s/sx of bleeding  Thank you for involving pharmacy in this patient's care. Okey RegalLisa Gurtaj Ruz, PharmD [phone]  12/28/2017 9:47 AM

## 2017-12-28 NOTE — Progress Notes (Signed)
[facility] SNF starting insurance authorization process. CSW will notify MD when authorization is received.   Osborne Cascoadia Reford Olliff LCSW [phone]
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
## 2017-12-28 NOTE — Plan of Care (Signed)
  Problem: Elimination: Goal: Will not experience complications related to bowel motility Outcome: Progressing Goal: Will not experience complications related to urinary retention Outcome: Progressing   Problem: Activity: Goal: Risk for activity intolerance will decrease Outcome: Not Progressing  Problem: Pain Managment: Goal: General experience of comfort will improve Outcome: Not Progressing  PT. States he has constant pain
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
## 2017-12-28 NOTE — Progress Notes (Signed)
CSW received consult regarding SNF placement. CSW can begin SNF search once PT evaluates patient. Patient's insurance will likely take greater than 24 hours for approval.   Cristobal Goldmannadia Reonna Finlayson LCSW [phone]
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
## 2017-12-29 DIAGNOSIS — I10 Essential (primary) hypertension: Secondary | ICD-10-CM | POA: Diagnosis not present

## 2017-12-29 DIAGNOSIS — D649 Anemia, unspecified: Secondary | ICD-10-CM

## 2017-12-29 DIAGNOSIS — N289 Disorder of kidney and ureter, unspecified: Secondary | ICD-10-CM

## 2017-12-29 DIAGNOSIS — S7010XA Contusion of unspecified thigh, initial encounter: Secondary | ICD-10-CM | POA: Diagnosis present

## 2017-12-29 DIAGNOSIS — I6930 Unspecified sequelae of cerebral infarction: Secondary | ICD-10-CM

## 2017-12-29 DIAGNOSIS — Z7901 Long term (current) use of anticoagulants: Secondary | ICD-10-CM

## 2017-12-29 DIAGNOSIS — I951 Orthostatic hypotension: Secondary | ICD-10-CM | POA: Diagnosis not present

## 2017-12-29 DIAGNOSIS — Y92009 Unspecified place in unspecified non-institutional (private) residence as the place of occurrence of the external cause: Secondary | ICD-10-CM

## 2017-12-29 DIAGNOSIS — Z9989 Dependence on other enabling machines and devices: Secondary | ICD-10-CM

## 2017-12-29 DIAGNOSIS — W19XXXA Unspecified fall, initial encounter: Secondary | ICD-10-CM

## 2017-12-29 DIAGNOSIS — G4733 Obstructive sleep apnea (adult) (pediatric): Secondary | ICD-10-CM

## 2017-12-29 LAB — CBC
HEMATOCRIT: 35.6 % — AB (ref 39.0–52.0)
Hemoglobin: 11.2 g/dL — ABNORMAL LOW (ref 13.0–17.0)
MCH: 31.4 pg (ref 26.0–34.0)
MCHC: 31.5 g/dL (ref 30.0–36.0)
MCV: 99.7 fL (ref 78.0–100.0)
Platelets: 310 10*3/uL (ref 150–400)
RBC: 3.57 MIL/uL — ABNORMAL LOW (ref 4.22–5.81)
RDW: 14.6 % (ref 11.5–15.5)
WBC: 8.5 10*3/uL (ref 4.0–10.5)

## 2017-12-29 LAB — BASIC METABOLIC PANEL
Anion gap: 9 (ref 5–15)
BUN: 19 mg/dL (ref 8–23)
CALCIUM: 8.4 mg/dL — AB (ref 8.9–10.3)
CO2: 27 mmol/L (ref 22–32)
CREATININE: 1.26 mg/dL — AB (ref 0.61–1.24)
Chloride: 101 mmol/L (ref 98–111)
GFR calc Af Amer: >60 mL/min (ref >60)
GFR calc non Af Amer: 55 mL/min — ABNORMAL LOW (ref >60)
GLUCOSE: 117 mg/dL — AB (ref 70–99)
Potassium: 4.1 mmol/L (ref 3.5–5.1)
Sodium: 137 mmol/L (ref 135–145)

## 2017-12-29 LAB — PROTIME-INR
INR: 1.25
PROTHROMBIN TIME: 15.6 s — AB (ref 11.4–15.2)

## 2017-12-29 MED ORDER — WARFARIN SODIUM 5 MG PO TABS
5.0000 mg | ORAL_TABLET | Freq: Once | ORAL | Status: AC
  Administered 2017-12-29: 5 mg via ORAL
  Filled 2017-12-29 (×2): qty 1

## 2017-12-29 NOTE — Care Management Important Message (Signed)
Important Message  Patient Details  Name: Jorge [name] MRN: [id number] Date of Birth: 04/30/1945   Medicare Important Message Given:  Yes    Oralia RudMegan P Shealee Yordy 12/29/2017, 3:31 PM
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
## 2017-12-29 NOTE — Progress Notes (Signed)
CSW sent requested PT notes to [employer] for insurance. Still no approval.   Cristobal GoldmannNadia Phyillis Dascoli LCSW [phone]

## 2017-12-29 NOTE — Progress Notes (Signed)
ANTICOAGULATION CONSULT NOTE  Pharmacy Consult for warfarin Indication: atrial fibrillation  No Known Allergies  Patient Measurements: Height: 6\' 2"  (188 cm) Weight: (!) 328 lb 8 oz (149 kg)(Scale B ) IBW/kg (Calculated) : 82.2  Vital Signs: Temp: 97.9 F (36.6 C) (09/24 0420) Temp Source: Oral (09/24 0420) BP: 110/65 (09/24 0420) Pulse Rate: 57 (09/24 0420)  Labs: Recent Labs    12/27/17 0506 12/28/17 0621 12/29/17 0612  HGB  --  11.4* 11.2*  HCT  --  35.7* 35.6*  PLT  --  292 310  LABPROT 18.6* 15.8* 15.6*  INR 1.56 1.28 1.25  CREATININE  --  1.30* 1.26*    Estimated Creatinine Clearance: 81.6 mL/min (A) (by C-G formula based on SCr of 1.26 mg/dL (H)).    Assessment: Jorge [name] is a 72yo male [facility] with increasing pain in the right lower extremity. At the time, a CT angio of the leg demonstrated hematoma, patient was discharged. Patient presented again 9/19 after his "leg gave way" and had a fall.   PTA warfarin has been held in the setting of fall and hematoma. Pharmacy has now been consulted to restart warfarin at reduced dose due to recent fall/hematoma. PTA regimen 7.5mg  daily.   INR today = 1.25  Goal of Therapy:  INR 2-3 Monitor platelets by anticoagulation protocol: Yes   Plan:  Will give warfarin 5 mg tonight x1 Daily INR, CBC, monitor for s/sx of bleeding  Jeanella Caraathy Navy Belay, PharmD, [facility] Clinical Pharmacist Please see AMION for all Pharmacists' Contact Phone Numbers 12/29/2017, 9:38 AM

## 2017-12-29 NOTE — Care Management Note (Signed)
Case Management Note  Patient Details  Name: Jorge [name] MRN: [id number] Date of Birth: [birth year]  Subjective/Objective:  Ambulatory dysfunction, right leg pain, hematoma, orthostatic hypotension, CAD, anemia                  Action/Plan: CSW for SNF placement. Notified [facility] that pt with dc to SNF.   Expected Discharge Date:  12/28/17               Expected Discharge Plan:  Skilled Nursing Facility  In-House Referral:  Clinical Social Work  Discharge planning Services  CM Consult  Post Acute Care Choice:  NA Choice offered to:  NA  DME Arranged:  N/A DME Agency:  NA  HH Arranged:  RN, Nurse's Aide, PT HH Agency:  [facility]  Status of Service:  Completed, signed off  If discussed at Long Length of Stay Meetings, dates discussed:    Additional Comments:  Elliot CousinShavis, Kali Deadwyler Ellen, RN 12/29/2017, 11:25 AM

## 2017-12-29 NOTE — Progress Notes (Signed)
Patient continues to decline bed alarm and to have bed in lowest position.

## 2017-12-30 DIAGNOSIS — I951 Orthostatic hypotension: Secondary | ICD-10-CM | POA: Diagnosis not present

## 2017-12-30 DIAGNOSIS — I10 Essential (primary) hypertension: Secondary | ICD-10-CM | POA: Diagnosis not present

## 2017-12-30 DIAGNOSIS — Z7901 Long term (current) use of anticoagulants: Secondary | ICD-10-CM | POA: Diagnosis not present

## 2017-12-30 DIAGNOSIS — I6930 Unspecified sequelae of cerebral infarction: Secondary | ICD-10-CM | POA: Diagnosis not present

## 2017-12-30 LAB — BASIC METABOLIC PANEL
ANION GAP: 8 (ref 5–15)
BUN: 17 mg/dL (ref 8–23)
CALCIUM: 8.4 mg/dL — AB (ref 8.9–10.3)
CO2: 28 mmol/L (ref 22–32)
Chloride: 102 mmol/L (ref 98–111)
Creatinine, Ser: 1.28 mg/dL — ABNORMAL HIGH (ref 0.61–1.24)
GFR, EST NON AFRICAN AMERICAN: 54 mL/min — AB (ref >60)
Glucose, Bld: 116 mg/dL — ABNORMAL HIGH (ref 70–99)
Potassium: 4.2 mmol/L (ref 3.5–5.1)
SODIUM: 138 mmol/L (ref 135–145)

## 2017-12-30 LAB — CBC
HEMATOCRIT: 34.2 % — AB (ref 39.0–52.0)
Hemoglobin: 10.8 g/dL — ABNORMAL LOW (ref 13.0–17.0)
MCH: 31.5 pg (ref 26.0–34.0)
MCHC: 31.6 g/dL (ref 30.0–36.0)
MCV: 99.7 fL (ref 78.0–100.0)
Platelets: 289 10*3/uL (ref 150–400)
RBC: 3.43 MIL/uL — ABNORMAL LOW (ref 4.22–5.81)
RDW: 14.6 % (ref 11.5–15.5)
WBC: 8.3 10*3/uL (ref 4.0–10.5)

## 2017-12-30 LAB — PROTIME-INR
INR: 1.26
PROTHROMBIN TIME: 15.7 s — AB (ref 11.4–15.2)

## 2017-12-30 MED ORDER — FUROSEMIDE 40 MG PO TABS: 40.0000 mg | ORAL_TABLET | Freq: Every day | ORAL | 0 refills | Status: DC

## 2017-12-30 MED ORDER — WARFARIN SODIUM 5 MG PO TABS: 5.0000 mg | ORAL_TABLET | Freq: Once | ORAL | Status: DC

## 2017-12-30 NOTE — Progress Notes (Signed)
ANTICOAGULATION CONSULT NOTE  Pharmacy Consult for warfarin Indication: atrial fibrillation  No Known Allergies  Patient Measurements: Height: 6\' 2"  (188 cm) Weight: (!) 328 lb 8 oz (149 kg) IBW/kg (Calculated) : 82.2  Vital Signs: Temp: 97.8 F (36.6 C) (09/25 0358) Temp Source: Oral (09/25 0358) BP: 115/64 (09/25 0358) Pulse Rate: 56 (09/25 0358)  Labs: Recent Labs    12/28/17 0621 12/29/17 0612 12/30/17 0448  HGB 11.4* 11.2* 10.8*  HCT 35.7* 35.6* 34.2*  PLT 292 310 289  LABPROT 15.8* 15.6* 15.7*  INR 1.28 1.25 1.26  CREATININE 1.30* 1.26* 1.28*    Estimated Creatinine Clearance: 80.4 mL/min (A) (by C-G formula based on SCr of 1.28 mg/dL (H)).    Assessment: Jorge [name] is a 72yo male presented to [facility] ED 9/18 with increasing pain in the right lower extremity. At the time, a CT angio of the leg demonstrated hematoma, patient was discharged. Patient presented again 9/19 after his "leg gave way" and had a fall.   PTA warfarin has been held in the setting of fall and hematoma. regimen 7.5mg  daily.   INR today = 1.28  Goal of Therapy:  INR 2-3 Monitor platelets by anticoagulation protocol: Yes   Plan:  Coumadin 5 mg x1 tonight Daily INR   Isaac Bliss, PharmD, BCPS, BCCCP Clinical Pharmacist [phone]  Please check AMION for all [facility] Pharmacy numbers  12/30/2017 9:44 AM
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
## 2017-12-30 NOTE — Clinical Social Work Placement (Signed)
   CLINICAL SOCIAL WORK PLACEMENT  NOTE  Date:  12/30/2017  Patient Details  Name: Jorge [name] MRN: [id number] Date of Birth: 29-Jan-1946  Clinical Social Work is seeking post-discharge placement for this patient at the Skilled  Nursing Facility level of care (*CSW will initial, date and re-position this form in  chart as items are completed):  Yes   Patient/family provided with [facility] Clinical Social Work Department's list of facilities offering this level of care within the geographic area requested by the patient (or if unable, by the patient's family).  Yes   Patient/family informed of their freedom to choose among providers that offer the needed level of care, that participate in Medicare, Medicaid or managed care program needed by the patient, have an available bed and are willing to accept the patient.  Yes   Patient/family informed of [facility]'s ownership interest in [facility] and [facility], as well as of the fact that they are under no obligation to receive care at these facilities.  PASRR submitted to EDS on       PASRR number received on       Existing PASRR number confirmed on 12/28/17     FL2 transmitted to all facilities in geographic area requested by pt/family on 12/28/17     FL2 transmitted to all facilities within larger geographic area on       Patient informed that his/her managed care company has contracts with or will negotiate with certain facilities, including the following:        Yes   Patient/family informed of bed offers received.  Patient chooses bed at [facility]     Physician recommends and patient chooses bed at      Patient to be transferred to [facility] on 12/30/17.  Patient to be transferred to facility by ptar     Patient family notified on 12/30/17 of transfer.  Name of family member notified:  Patient alerting family     PHYSICIAN       Additional Comment:     _______________________________________________ Mearl Latin, LCSWA 12/30/2017, 2:36 PM

## 2017-12-30 NOTE — Care Management Obs Status (Signed)
MEDICARE OBSERVATION STATUS NOTIFICATION   Patient Details  Name: Jorge [name] MRN: [id number] Date of Birth: [birth year]   Medicare Observation Status Notification Given:  Yes    Lawerance Sabal, RN 12/30/2017, 9:50 AM
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
## 2017-12-30 NOTE — Progress Notes (Signed)
Patient discharged:  to [facility]  Via: PTAR  IV and telemetry disconnected  Belongings given to patient

## 2017-12-30 NOTE — Progress Notes (Addendum)
Report called to [place], RN to [employer] Barbaraann Cao   [employer], [employer]

## 2017-12-30 NOTE — Progress Notes (Signed)
Patient will DC to: [facility] Anticipated DC date: 12/30/17 Family notified: Patient alerting son Transport by: Sharin Mons   Per MD patient ready for DC to [facility]. RN, patient, patient's family, and facility notified of DC. Discharge Summary sent to facility. RN given number for report [phone]). DC packet on chart. Ambulance transport requested for patient.   CSW signing off.  Cristobal Goldmann, LCSW Clinical Social Worker [phone]

## 2017-12-30 NOTE — Care Management CC44 (Signed)
Condition Code 44 Documentation Completed  Patient Details  Name: Jorge [name] MRN: [id number] Date of Birth: 1945/11/17   Condition Code 44 given:  Yes Patient signature on Condition Code 44 notice:  Yes Documentation of 2 MD's agreement:  Yes Code 44 added to claim:  Yes    Lawerance Sabalebbie Antanisha Mohs, RN 12/30/2017, 9:50 AM

## 2018-01-06 ENCOUNTER — Telehealth: Payer: Self-pay | Admitting: *Deleted

## 2018-01-06 ENCOUNTER — Other Ambulatory Visit: Payer: Self-pay

## 2018-01-06 ENCOUNTER — Encounter: Payer: Self-pay | Admitting: Vascular Surgery

## 2018-01-06 ENCOUNTER — Encounter: Payer: Self-pay | Admitting: Nurse Practitioner

## 2018-01-06 ENCOUNTER — Ambulatory Visit (HOSPITAL_COMMUNITY)
Admission: RE | Admit: 2018-01-06 | Discharge: 2018-01-06 | Disposition: A | Discharge status: Home / Self Care | Payer: Medicare Other | Source: Ambulatory Visit | Attending: Family | Admitting: Family

## 2018-01-06 ENCOUNTER — Ambulatory Visit (INDEPENDENT_AMBULATORY_CARE_PROVIDER_SITE_OTHER): Payer: Medicare Other | Admitting: Vascular Surgery

## 2018-01-06 ENCOUNTER — Ambulatory Visit (INDEPENDENT_AMBULATORY_CARE_PROVIDER_SITE_OTHER): Payer: Medicare Other | Admitting: Nurse Practitioner

## 2018-01-06 VITALS — BP 120/62 | HR 53 | Ht 72.0 in | Wt 328.1 lb

## 2018-01-06 VITALS — BP 135/74 | HR 57 | Resp 18 | Ht 72.0 in | Wt 328.0 lb

## 2018-01-06 DIAGNOSIS — I6523 Occlusion and stenosis of bilateral carotid arteries: Secondary | ICD-10-CM | POA: Insufficient documentation

## 2018-01-06 DIAGNOSIS — E785 Hyperlipidemia, unspecified: Secondary | ICD-10-CM | POA: Insufficient documentation

## 2018-01-06 DIAGNOSIS — R55 Syncope and collapse: Secondary | ICD-10-CM | POA: Insufficient documentation

## 2018-01-06 DIAGNOSIS — Z952 Presence of prosthetic heart valve: Secondary | ICD-10-CM | POA: Diagnosis not present

## 2018-01-06 DIAGNOSIS — Z87891 Personal history of nicotine dependence: Secondary | ICD-10-CM | POA: Insufficient documentation

## 2018-01-06 DIAGNOSIS — I10 Essential (primary) hypertension: Secondary | ICD-10-CM | POA: Diagnosis not present

## 2018-01-06 DIAGNOSIS — I708 Atherosclerosis of other arteries: Secondary | ICD-10-CM | POA: Insufficient documentation

## 2018-01-06 NOTE — Telephone Encounter (Signed)
Faxed Norma Fredrickson, NP ov note @ [phone] phone # is [phone] via Epic to [facility].
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
## 2018-01-06 NOTE — Patient Instructions (Addendum)
We will be checking the following labs today - NONE   Medication Instructions:    Continue with your current medicines.     Testing/Procedures To Be Arranged:  Carotid dopplers ASAP at [facility]   Follow-Up:   See Dr. Eden Emms in 3 to 4 months  Needs an appointment with Dr. Vickey Huger to follow up syncope - he has seen her before.     Other Special Instructions:   N/A    If you need a refill on your cardiac medications before your next appointment, please call your pharmacy.   Call the [facility] office at [phone] if you have any questions, problems or concerns.
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
## 2018-01-07 ENCOUNTER — Encounter: Payer: Self-pay | Admitting: Vascular Surgery

## 2018-01-07 ENCOUNTER — Ambulatory Visit: Payer: Self-pay | Admitting: Neurology

## 2018-01-07 ENCOUNTER — Other Ambulatory Visit: Payer: Self-pay

## 2018-01-07 ENCOUNTER — Ambulatory Visit (HOSPITAL_COMMUNITY)
Admission: RE | Admit: 2018-01-07 | Discharge: 2018-01-07 | Disposition: A | Discharge status: Home / Self Care | Payer: Medicare Other | Source: Ambulatory Visit | Attending: Vascular Surgery | Admitting: Vascular Surgery

## 2018-01-07 ENCOUNTER — Ambulatory Visit (INDEPENDENT_AMBULATORY_CARE_PROVIDER_SITE_OTHER): Payer: Medicare Other | Admitting: Vascular Surgery

## 2018-01-07 VITALS — BP 89/62 | HR 52 | Temp 98.2°F | Resp 18 | Ht 72.0 in | Wt 328.0 lb

## 2018-01-07 DIAGNOSIS — I6523 Occlusion and stenosis of bilateral carotid arteries: Secondary | ICD-10-CM | POA: Insufficient documentation

## 2018-01-07 MED ORDER — IOPAMIDOL (ISOVUE-370) INJECTION 76%
INTRAVENOUS | Status: AC
  Administered 2018-01-07: 50 mL
  Filled 2018-01-07: qty 50

## 2018-01-07 NOTE — H&P (View-Only) (Signed)
Patient is a 72 year old male who returns for follow-up today.  He was seen yesterday for symptomatic carotid stenosis.  He recently had some TIA symptoms where his right upper extremity and right face became weak.  He has not had any episodes over the last 24 hours.  He is on Coumadin for chronic atrial fibrillation.  Is on a statin.  Current Outpatient Medications on File Prior to Visit  Medication Sig Dispense Refill  . acetaminophen (TYLENOL) 325 MG tablet Take 2 tablets (650 mg total) by mouth every 4 (four) hours as needed for headache or mild pain.    Marland Kitchen amiodarone (PACERONE) 200 MG tablet Take 1 tablet (200 mg total) by mouth daily. 90 tablet 1  . atorvastatin (LIPITOR) 40 MG tablet Take 40 mg by mouth daily.    . bisacodyl (DULCOLAX) 5 MG EC tablet Take 5 mg by mouth daily as needed for moderate constipation.    . DULoxetine (CYMBALTA) 60 MG capsule Take 60 mg by mouth daily.    . fluticasone (FLONASE) 50 MCG/ACT nasal spray Place 1 spray into both nostrils daily as needed for allergies.   10  . furosemide (LASIX) 40 MG tablet Take 1 tablet (40 mg total) by mouth daily. 30 tablet 0  . gabapentin (NEURONTIN) 800 MG tablet Take 800 mg by mouth at bedtime.    Marland Kitchen HYDROcodone-acetaminophen (NORCO/VICODIN) 5-325 MG tablet Take 1-2 tablets by mouth every 6 (six) hours as needed for severe pain. 10 tablet 0  . levothyroxine (SYNTHROID, LEVOTHROID) 100 MCG tablet Take 100 mcg by mouth daily.     . Melatonin ER 5 MG TBCR Take 5 mg by mouth at bedtime.    . metoprolol tartrate (LOPRESSOR) 50 MG tablet Take 50 mg by mouth 2 (two) times daily.    . nitroGLYCERIN (NITROSTAT) 0.4 MG SL tablet Place 1 tablet (0.4 mg total) under the tongue every 5 (five) minutes as needed for chest pain. 20 tablet 1  . omeprazole (PRILOSEC) 40 MG capsule Take 40 mg by mouth daily as needed (indigestion).     . polyethylene glycol powder (GLYCOLAX/MIRALAX) powder Take 17 g by mouth daily. (Patient taking differently: Take 17  g by mouth daily as needed (constipation). ) 255 g 0  . traMADol (ULTRAM) 50 MG tablet Take 100 mg by mouth 3 (three) times daily as needed for moderate pain.     Marland Kitchen warfarin (COUMADIN) 5 MG tablet Take 1.5 tablets (7.5 mg total) by mouth daily. Take as directed by PCP     No current facility-administered medications on file prior to visit.      Physical exam:  Vitals:   01/07/18 1115 01/07/18 1118  BP: 115/64 (!) 89/62  Pulse: (!) 52   Resp: 18   Temp: 98.2 F (36.8 C)   TempSrc: Oral   SpO2: 93%   Weight: (!) 328 lb (148.8 kg)   Height: 6' (1.829 m)     Neck: No carotid bruit  Chest: Clear to auscultation  Cardiac: Regular rate and rhythm  Abdomen: Obese  Neuro: Symmetric upper extremity lower extremity motor strength 5/5 no facial asymmetry  Data: I reviewed the patient's CT exam today which shows a 70 to 80% right internal carotid artery stenosis.  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  He has a subtotal occlusion of his left common carotid artery at the proximal aspect of the patch in the left neck.  Patch extends distally to a very  high location making redo carotid endarterectomy very difficult.  Assessment: Symptomatic left internal carotid artery stenosis.  I will review the films for consideration for T CAR procedure on January 15, 2018.  We will add aspirin to his warfarin for stroke prophylaxis.  Plan: Left side T car scheduled for January 15, 2018 with my partner [name].  If the lesion is not amenable to stenting we may need to consider redo operation however this would have a high risk of cranial nerve injury due to the distal extent of the lesion  Risk benefits possible complications of procedure details were discussed with the patient today including not limited to bleeding infection risk of stroke risk of cranial nerve injury.  He understands and wishes to proceed.  Fabienne Bruns, MD [facility] [place] Office: [phone] Pager: [phone]

## 2018-01-07 NOTE — Progress Notes (Signed)
Patient is a 72-year-old male who returns for follow-up today.  He was seen yesterday for symptomatic carotid stenosis.  He recently had some TIA symptoms where his right upper extremity and right face became weak.  He has not had any episodes over the last 24 hours.  He is on Coumadin for chronic atrial fibrillation.  Is on a statin.  Current Outpatient Medications on File Prior to Visit  Medication Sig Dispense Refill  . acetaminophen (TYLENOL) 325 MG tablet Take 2 tablets (650 mg total) by mouth every 4 (four) hours as needed for headache or mild pain.    . amiodarone (PACERONE) 200 MG tablet Take 1 tablet (200 mg total) by mouth daily. 90 tablet 1  . atorvastatin (LIPITOR) 40 MG tablet Take 40 mg by mouth daily.    . bisacodyl (DULCOLAX) 5 MG EC tablet Take 5 mg by mouth daily as needed for moderate constipation.    . DULoxetine (CYMBALTA) 60 MG capsule Take 60 mg by mouth daily.    . fluticasone (FLONASE) 50 MCG/ACT nasal spray Place 1 spray into both nostrils daily as needed for allergies.   10  . furosemide (LASIX) 40 MG tablet Take 1 tablet (40 mg total) by mouth daily. 30 tablet 0  . gabapentin (NEURONTIN) 800 MG tablet Take 800 mg by mouth at bedtime.    . HYDROcodone-acetaminophen (NORCO/VICODIN) 5-325 MG tablet Take 1-2 tablets by mouth every 6 (six) hours as needed for severe pain. 10 tablet 0  . levothyroxine (SYNTHROID, LEVOTHROID) 100 MCG tablet Take 100 mcg by mouth daily.     . Melatonin ER 5 MG TBCR Take 5 mg by mouth at bedtime.    . metoprolol tartrate (LOPRESSOR) 50 MG tablet Take 50 mg by mouth 2 (two) times daily.    . nitroGLYCERIN (NITROSTAT) 0.4 MG SL tablet Place 1 tablet (0.4 mg total) under the tongue every 5 (five) minutes as needed for chest pain. 20 tablet 1  . omeprazole (PRILOSEC) 40 MG capsule Take 40 mg by mouth daily as needed (indigestion).     . polyethylene glycol powder (GLYCOLAX/MIRALAX) powder Take 17 g by mouth daily. (Patient taking differently: Take 17  g by mouth daily as needed (constipation). ) 255 g 0  . traMADol (ULTRAM) 50 MG tablet Take 100 mg by mouth 3 (three) times daily as needed for moderate pain.     . warfarin (COUMADIN) 5 MG tablet Take 1.5 tablets (7.5 mg total) by mouth daily. Take as directed by PCP     No current facility-administered medications on file prior to visit.      Physical exam:  Vitals:   01/07/18 1115 01/07/18 1118  BP: 115/64 (!) 89/62  Pulse: (!) 52   Resp: 18   Temp: 98.2 F (36.8 C)   TempSrc: Oral   SpO2: 93%   Weight: (!) 328 lb (148.8 kg)   Height: 6' (1.829 m)     Neck: No carotid bruit  Chest: Clear to auscultation  Cardiac: Regular rate and rhythm  Abdomen: Obese  Neuro: Symmetric upper extremity lower extremity motor strength 5/5 no facial asymmetry  Data: I reviewed the patient's CT exam today which shows a 70 to 80% right internal carotid artery stenosis.  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  He has a subtotal occlusion of his left common carotid artery at the proximal aspect of the patch in the left neck.  Patch extends distally to a very   high location making redo carotid endarterectomy very difficult.  Assessment: Symptomatic left internal carotid artery stenosis.  I will review the films for consideration for T CAR procedure on January 15, 2018.  We will add aspirin to his warfarin for stroke prophylaxis.  Plan: Left side T car scheduled for January 15, 2018 with my partner Dr. Clark.  If the lesion is not amenable to stenting we may need to consider redo operation however this would have a high risk of cranial nerve injury due to the distal extent of the lesion  Risk benefits possible complications of procedure details were discussed with the patient today including not limited to bleeding infection risk of stroke risk of cranial nerve injury.  He understands and wishes to proceed.  Shalan Neault, MD [facility] [place] Office: [phone] Pager: [phone]  

## 2018-01-08 ENCOUNTER — Other Ambulatory Visit: Payer: Self-pay | Admitting: *Deleted

## 2018-01-08 ENCOUNTER — Telehealth: Payer: Self-pay

## 2018-01-08 DIAGNOSIS — I6523 Occlusion and stenosis of bilateral carotid arteries: Secondary | ICD-10-CM

## 2018-01-08 MED ORDER — CLOPIDOGREL BISULFATE 75 MG PO TABS: 75.0000 mg | ORAL_TABLET | Freq: Every day | ORAL | 11 refills | Status: DC

## 2018-01-08 NOTE — Telephone Encounter (Signed)
This patient is scheduled for trans carotid artery revascularization with Dr. Darrick Penna on 01/15/18. Could you both take a look at this and make recommendations? Wanted to run it past you guys given the pharmacy request to stop the patients coumadin and start Plavix 3 days pre-op per TCAR protocol.   He has a hx of PAF on chronic coumadin, HTN, morbid obesity, OSA on CPAP, carotid artery disease s/p L CEA in 2013 with Dr. Darrick Penna, previous CVA with residual right hemiparesis, COPD, & previously severe AS s/p TAVR (04/2016).  Patient underwent cardiac catheterization in January 2018 prior to TAVR. CT exam on 01/06/18 which showed a 70 to 80% right internal carotid artery stenosis.  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   He was last seen by Lawson Fiscal 01/06/18 who is the one who made the urgent referral to [facility] on 01/06/18>01/07/18  Please forward your responses back to pre-op pool so that we can take care of it.   Thanks

## 2018-01-08 NOTE — Telephone Encounter (Signed)
Agree pt will require Lovenox bridge if he holds warfarin for 3 days prior to procedure due to history of afib and stroke. We do not manage warfarin - would reach out to managing provider to coordinate Lovenox bridge.

## 2018-01-08 NOTE — Telephone Encounter (Signed)
   [facility] Pre-operative Risk Assessment    Request for surgical clearance:  1. What type of surgery is being performed? TCAR   2. When is this surgery scheduled?  01/15/18   3. What type of clearance is required (medical clearance vs. Pharmacy clearance to hold med vs. Both)?  BOTH  4. Are there any medications that need to be held prior to surgery and how long? STOP Coumadin and START Plavix 3 days pre-op; ASA/plavix/statin per TCAR protocal  5. Practice name and name of physician performing surgery?   [facility]/ Dr Oneida Alar  6. What is your office phone number [phone]    7.   What is your office fax number [phone]  8.   Anesthesia type (None, local, MAC, general) ? general   Jorge [name] 01/08/2018, 2:59 PM  _________________________________________________________________   (provider comments below)

## 2018-01-08 NOTE — Telephone Encounter (Signed)
PaF with previous stroke should have lovenox bridge before procedure and not just stop coumadin Pharm D can help with this

## 2018-01-08 NOTE — Progress Notes (Signed)
Call to nurse Theressa Millard at nursing home to review all pr-op instructions. Patient was to start ASA on 01/07/18. Hold Coumadin for 3 days pre-op and start Plavix. To continue and take am of surgery ASA, Plavix and Lipitor. NPO past MN except for sips of water with medications. To be at [facility] admitting at 5:30 am on 01/15/18 for TCAR. Expect a call and follow the detailed instructions received from the hospital pre-admission department for this surgery. RX fox Plavix sent to [facility]. Name and number given to call this office for any questions.

## 2018-01-11 ENCOUNTER — Telehealth: Payer: Self-pay | Admitting: *Deleted

## 2018-01-11 NOTE — Telephone Encounter (Signed)
   Primary Cardiologist: Charlton Haws, MD  Chart reviewed as part of pre-operative protocol coverage. Given past medical history and time since last visit, based on ACC/AHA guidelines, MICKEAL DAWS would be at acceptable risk for the planned procedure without further cardiovascular testing. Pt was seen in the office 10/02 and with non obstructive CAD on recent cath and hx of TAVR pt is stable to proceed with vascular surgery.    I will route this recommendation to the requesting party via Epic fax function and remove from pre-op pool.  Please call with questions.  Nada Boozer, NP 01/11/2018, 4:10 PM

## 2018-01-11 NOTE — Telephone Encounter (Signed)
Left msg. At PCP for call back to this office to discuss procedure, Coumadin hold and recommended Lovenox bridge per Cardiology.

## 2018-01-11 NOTE — Telephone Encounter (Signed)
Spoke with JAN at [name]) office they will fax medication clearance after they see patient on 01/12/18 for Coumadin hold/ Lovenox bridge.

## 2018-01-12 ENCOUNTER — Encounter: Payer: Self-pay | Admitting: Physician Assistant

## 2018-01-14 ENCOUNTER — Encounter (HOSPITAL_COMMUNITY): Payer: Self-pay | Admitting: *Deleted

## 2018-01-14 ENCOUNTER — Other Ambulatory Visit: Payer: Self-pay

## 2018-01-14 MED ORDER — DEXTROSE 5 % IV SOLN
3.0000 g | INTRAVENOUS | Status: AC
  Administered 2018-01-15: 3 g via INTRAVENOUS
  Filled 2018-01-14: qty 3

## 2018-01-14 NOTE — Pre-Procedure Instructions (Addendum)
    Jorge [name]  01/14/2018    Your procedure is scheduled on  Friday, October 11..  Report to [facility] Admitting at 5:30 AM    Call this number if you have problems the morning of surgery: [phone]  This is the number for the Pre- Surgical Desk. Special instructions:  - Send Medication Record with administered medications documented.    Remember:  Do not eat or drink after midnight.    Take these medicines the morning of surgery with A SIP OF WATER : amiodarone (PACERONE) aspirin atorvastatin (LIPITOR)  levothyroxine (SYNTHROID, LEVOTHROID) metoprolol tartrate (LOPRESSOR)  May take if needed: acetaminophen (TYLENOL) fluticasone (FLONASE) nasal spray  HYDROcodone-acetaminophen (NORCO/VICODIN) or traMADol (ULTRAM)  Nitroglycerin             Shower, wear clean clothes., brush teeth.             Do not wear lotions, powders, or perfumes, or deodorant.  Men may shave face and neck.  Do not bring valuables to the hospital.  [facility] is not responsible for any belongings or valuables.  Contacts, dentures or bridgework may not be worn into surgery.  Leave your suitcase in the car.  After surgery it may be brought to your room.
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
## 2018-01-15 ENCOUNTER — Telehealth: Payer: Self-pay | Admitting: Vascular Surgery

## 2018-01-15 ENCOUNTER — Inpatient Hospital Stay (HOSPITAL_COMMUNITY): Payer: Medicare Other | Admitting: Certified Registered"

## 2018-01-15 ENCOUNTER — Encounter (HOSPITAL_COMMUNITY): Payer: Self-pay

## 2018-01-15 ENCOUNTER — Encounter (HOSPITAL_COMMUNITY)
Admission: RE | Disposition: A | Discharge status: Home / Self Care | Payer: Self-pay | Source: Ambulatory Visit | Attending: Vascular Surgery

## 2018-01-15 ENCOUNTER — Inpatient Hospital Stay (HOSPITAL_COMMUNITY)
Admission: RE | Admit: 2018-01-15 | Discharge: 2018-01-16 | DRG: 982 | Disposition: A | Discharge status: Home / Self Care | Payer: Medicare Other | Source: Ambulatory Visit | Attending: Vascular Surgery | Admitting: Vascular Surgery

## 2018-01-15 DIAGNOSIS — G4730 Sleep apnea, unspecified: Secondary | ICD-10-CM | POA: Diagnosis present

## 2018-01-15 DIAGNOSIS — Z8673 Personal history of transient ischemic attack (TIA), and cerebral infarction without residual deficits: Secondary | ICD-10-CM | POA: Diagnosis not present

## 2018-01-15 DIAGNOSIS — I252 Old myocardial infarction: Secondary | ICD-10-CM

## 2018-01-15 DIAGNOSIS — Z79899 Other long term (current) drug therapy: Secondary | ICD-10-CM | POA: Diagnosis not present

## 2018-01-15 DIAGNOSIS — I6523 Occlusion and stenosis of bilateral carotid arteries: Secondary | ICD-10-CM | POA: Diagnosis present

## 2018-01-15 DIAGNOSIS — I2510 Atherosclerotic heart disease of native coronary artery without angina pectoris: Secondary | ICD-10-CM | POA: Diagnosis present

## 2018-01-15 DIAGNOSIS — J45909 Unspecified asthma, uncomplicated: Secondary | ICD-10-CM | POA: Diagnosis present

## 2018-01-15 DIAGNOSIS — Z87891 Personal history of nicotine dependence: Secondary | ICD-10-CM | POA: Diagnosis not present

## 2018-01-15 DIAGNOSIS — Z7901 Long term (current) use of anticoagulants: Secondary | ICD-10-CM

## 2018-01-15 DIAGNOSIS — I10 Essential (primary) hypertension: Secondary | ICD-10-CM | POA: Diagnosis present

## 2018-01-15 DIAGNOSIS — Z6841 Body Mass Index (BMI) 40.0 and over, adult: Secondary | ICD-10-CM | POA: Diagnosis not present

## 2018-01-15 DIAGNOSIS — I482 Chronic atrial fibrillation, unspecified: Secondary | ICD-10-CM | POA: Diagnosis present

## 2018-01-15 DIAGNOSIS — K219 Gastro-esophageal reflux disease without esophagitis: Secondary | ICD-10-CM | POA: Diagnosis present

## 2018-01-15 DIAGNOSIS — I6529 Occlusion and stenosis of unspecified carotid artery: Secondary | ICD-10-CM | POA: Diagnosis present

## 2018-01-15 DIAGNOSIS — E039 Hypothyroidism, unspecified: Secondary | ICD-10-CM | POA: Diagnosis present

## 2018-01-15 DIAGNOSIS — I6522 Occlusion and stenosis of left carotid artery: Secondary | ICD-10-CM

## 2018-01-15 HISTORY — DX: Unspecified asthma, uncomplicated: J45.909

## 2018-01-15 HISTORY — DX: Anxiety disorder, unspecified: F41.9

## 2018-01-15 HISTORY — DX: Dyspnea, unspecified: R06.00

## 2018-01-15 HISTORY — PX: TRANSCAROTID ARTERY REVASCULARIZATION (TCAR): SHX6784

## 2018-01-15 HISTORY — DX: Peripheral vascular disease, unspecified: I73.9

## 2018-01-15 HISTORY — DX: Sleep apnea, unspecified: G47.30

## 2018-01-15 HISTORY — DX: Gastro-esophageal reflux disease without esophagitis: K21.9

## 2018-01-15 HISTORY — PX: TRANSCAROTID ARTERY REVASCULARIZATIONA: SHX6778

## 2018-01-15 HISTORY — DX: Cardiac murmur, unspecified: R01.1

## 2018-01-15 HISTORY — DX: Cardiac arrhythmia, unspecified: I49.9

## 2018-01-15 LAB — CBC
HEMATOCRIT: 38 % — AB (ref 39.0–52.0)
HEMOGLOBIN: 11.7 g/dL — AB (ref 13.0–17.0)
MCH: 30.5 pg (ref 26.0–34.0)
MCHC: 30.8 g/dL (ref 30.0–36.0)
MCV: 99.2 fL (ref 80.0–100.0)
NRBC: 0 % (ref 0.0–0.2)
Platelets: 259 10*3/uL (ref 150–400)
RBC: 3.83 MIL/uL — AB (ref 4.22–5.81)
RDW: 15.5 % (ref 11.5–15.5)
WBC: 5 10*3/uL (ref 4.0–10.5)

## 2018-01-15 LAB — COMPREHENSIVE METABOLIC PANEL
ALT: 26 U/L (ref 0–44)
ANION GAP: 10 (ref 5–15)
AST: 21 U/L (ref 15–41)
Albumin: 3.6 g/dL (ref 3.5–5.0)
Alkaline Phosphatase: 96 U/L (ref 38–126)
BUN: 10 mg/dL (ref 8–23)
CHLORIDE: 106 mmol/L (ref 98–111)
CO2: 23 mmol/L (ref 22–32)
Calcium: 8.4 mg/dL — ABNORMAL LOW (ref 8.9–10.3)
Creatinine, Ser: 1.11 mg/dL (ref 0.61–1.24)
Glucose, Bld: 118 mg/dL — ABNORMAL HIGH (ref 70–99)
POTASSIUM: 3.5 mmol/L (ref 3.5–5.1)
SODIUM: 139 mmol/L (ref 135–145)
Total Bilirubin: 1 mg/dL (ref 0.3–1.2)
Total Protein: 6.4 g/dL — ABNORMAL LOW (ref 6.5–8.1)

## 2018-01-15 LAB — SURGICAL PCR SCREEN
MRSA, PCR: NEGATIVE
STAPHYLOCOCCUS AUREUS: POSITIVE — AB

## 2018-01-15 LAB — PROTIME-INR
INR: 1.26
PROTHROMBIN TIME: 15.7 s — AB (ref 11.4–15.2)

## 2018-01-15 LAB — POCT ACTIVATED CLOTTING TIME
ACTIVATED CLOTTING TIME: 296 s
ACTIVATED CLOTTING TIME: 235 s

## 2018-01-15 LAB — APTT: aPTT: 39 seconds — ABNORMAL HIGH (ref 24–36)

## 2018-01-15 LAB — TYPE AND SCREEN
ABO/RH(D): O POS
ANTIBODY SCREEN: NEGATIVE

## 2018-01-15 SURGERY — TRANSCAROTID ARTERY REVASCULARIZATION (TCAR)
Anesthesia: General | Site: Neck | Laterality: Left

## 2018-01-15 MED ORDER — LEVOTHYROXINE SODIUM 112 MCG PO TABS
112.0000 ug | ORAL_TABLET | Freq: Every day | ORAL | Status: DC
  Administered 2018-01-16: 112 ug via ORAL
  Filled 2018-01-15: qty 1

## 2018-01-15 MED ORDER — ATORVASTATIN CALCIUM 40 MG PO TABS: 40.0000 mg | ORAL_TABLET | Freq: Every day | ORAL | Status: DC

## 2018-01-15 MED ORDER — ACETAMINOPHEN 325 MG PO TABS: 325.0000 mg | ORAL_TABLET | ORAL | Status: DC | PRN

## 2018-01-15 MED ORDER — HYDROCODONE-ACETAMINOPHEN 5-325 MG PO TABS: 1.0000 | ORAL_TABLET | Freq: Four times a day (QID) | ORAL | Status: DC | PRN

## 2018-01-15 MED ORDER — GABAPENTIN 400 MG PO CAPS
800.0000 mg | ORAL_CAPSULE | Freq: Every day | ORAL | Status: DC
  Administered 2018-01-15: 800 mg via ORAL
  Filled 2018-01-15: qty 2

## 2018-01-15 MED ORDER — LIDOCAINE HCL (PF) 1 % IJ SOLN
INTRAMUSCULAR | Status: AC
  Filled 2018-01-15: qty 30

## 2018-01-15 MED ORDER — CHLORHEXIDINE GLUCONATE 4 % EX LIQD: 60.0000 mL | Freq: Once | CUTANEOUS | Status: DC

## 2018-01-15 MED ORDER — PROPOFOL 10 MG/ML IV BOLUS
INTRAVENOUS | Status: DC | PRN
  Administered 2018-01-15: 150 mg via INTRAVENOUS
  Administered 2018-01-15: 20 mg via INTRAVENOUS

## 2018-01-15 MED ORDER — OXYCODONE HCL 5 MG PO TABS: 5.0000 mg | ORAL_TABLET | Freq: Once | ORAL | Status: DC | PRN

## 2018-01-15 MED ORDER — AMIODARONE HCL 200 MG PO TABS
200.0000 mg | ORAL_TABLET | Freq: Every day | ORAL | Status: DC
  Administered 2018-01-15 – 2018-01-16 (×2): 200 mg via ORAL
  Filled 2018-01-15 (×2): qty 1

## 2018-01-15 MED ORDER — PROPOFOL 10 MG/ML IV BOLUS
INTRAVENOUS | Status: AC
  Filled 2018-01-15: qty 20

## 2018-01-15 MED ORDER — PHENOL 1.4 % MT LIQD: 1.0000 | OROMUCOSAL | Status: DC | PRN

## 2018-01-15 MED ORDER — DOCUSATE SODIUM 100 MG PO CAPS
100.0000 mg | ORAL_CAPSULE | Freq: Every day | ORAL | Status: DC
  Administered 2018-01-16: 100 mg via ORAL
  Filled 2018-01-15: qty 1

## 2018-01-15 MED ORDER — LIDOCAINE 2% (20 MG/ML) 5 ML SYRINGE
INTRAMUSCULAR | Status: DC | PRN
  Administered 2018-01-15: 20 mg via INTRAVENOUS
  Administered 2018-01-15: 80 mg via INTRAVENOUS

## 2018-01-15 MED ORDER — FUROSEMIDE 40 MG PO TABS
40.0000 mg | ORAL_TABLET | Freq: Every day | ORAL | Status: DC
  Administered 2018-01-16: 40 mg via ORAL
  Filled 2018-01-15: qty 1

## 2018-01-15 MED ORDER — GLYCOPYRROLATE PF 0.2 MG/ML IJ SOSY
PREFILLED_SYRINGE | INTRAMUSCULAR | Status: DC | PRN
  Administered 2018-01-15 (×2): .1 mg via INTRAVENOUS

## 2018-01-15 MED ORDER — IODIXANOL 320 MG/ML IV SOLN
INTRAVENOUS | Status: DC | PRN
  Administered 2018-01-15: 50 mL via INTRAVENOUS

## 2018-01-15 MED ORDER — 0.9 % SODIUM CHLORIDE (POUR BTL) OPTIME
TOPICAL | Status: DC | PRN
  Administered 2018-01-15: 1000 mL

## 2018-01-15 MED ORDER — FLUTICASONE PROPIONATE 50 MCG/ACT NA SUSP
1.0000 | Freq: Every day | NASAL | Status: DC | PRN
  Filled 2018-01-15: qty 16

## 2018-01-15 MED ORDER — LABETALOL HCL 5 MG/ML IV SOLN: 10.0000 mg | INTRAVENOUS | Status: DC | PRN

## 2018-01-15 MED ORDER — FENTANYL CITRATE (PF) 100 MCG/2ML IJ SOLN: 25.0000 ug | INTRAMUSCULAR | Status: DC | PRN

## 2018-01-15 MED ORDER — FENTANYL CITRATE (PF) 100 MCG/2ML IJ SOLN
INTRAMUSCULAR | Status: DC | PRN
  Administered 2018-01-15: 25 ug via INTRAVENOUS
  Administered 2018-01-15 (×2): 50 ug via INTRAVENOUS

## 2018-01-15 MED ORDER — ROCURONIUM BROMIDE 10 MG/ML (PF) SYRINGE
PREFILLED_SYRINGE | INTRAVENOUS | Status: DC | PRN
  Administered 2018-01-15: 50 mg via INTRAVENOUS
  Administered 2018-01-15 (×2): 20 mg via INTRAVENOUS
  Administered 2018-01-15: 30 mg via INTRAVENOUS

## 2018-01-15 MED ORDER — SUGAMMADEX SODIUM 200 MG/2ML IV SOLN
INTRAVENOUS | Status: DC | PRN
  Administered 2018-01-15: 300 mg via INTRAVENOUS

## 2018-01-15 MED ORDER — ESMOLOL HCL 100 MG/10ML IV SOLN
INTRAVENOUS | Status: DC | PRN
  Administered 2018-01-15 (×2): 20 mg via INTRAVENOUS

## 2018-01-15 MED ORDER — METOPROLOL TARTRATE 50 MG PO TABS
50.0000 mg | ORAL_TABLET | Freq: Two times a day (BID) | ORAL | Status: DC
  Filled 2018-01-15 (×2): qty 1

## 2018-01-15 MED ORDER — MELATONIN 3 MG PO TABS
6.0000 mg | ORAL_TABLET | Freq: Every day | ORAL | Status: DC
  Administered 2018-01-15: 6 mg via ORAL
  Filled 2018-01-15 (×2): qty 2

## 2018-01-15 MED ORDER — GUAIFENESIN-DM 100-10 MG/5ML PO SYRP: 15.0000 mL | ORAL_SOLUTION | ORAL | Status: DC | PRN

## 2018-01-15 MED ORDER — SODIUM CHLORIDE 0.9 % IV SOLN
INTRAVENOUS | Status: DC
  Administered 2018-01-15: 11:00:00 via INTRAVENOUS

## 2018-01-15 MED ORDER — SODIUM CHLORIDE 0.9 % IV SOLN: INTRAVENOUS | Status: DC

## 2018-01-15 MED ORDER — ASPIRIN EC 81 MG PO TBEC
81.0000 mg | DELAYED_RELEASE_TABLET | Freq: Every day | ORAL | Status: DC
  Administered 2018-01-16: 81 mg via ORAL
  Filled 2018-01-15: qty 1

## 2018-01-15 MED ORDER — ROCURONIUM BROMIDE 50 MG/5ML IV SOSY
PREFILLED_SYRINGE | INTRAVENOUS | Status: AC
  Filled 2018-01-15: qty 5

## 2018-01-15 MED ORDER — BISACODYL 5 MG PO TBEC: 5.0000 mg | DELAYED_RELEASE_TABLET | Freq: Every day | ORAL | Status: DC | PRN

## 2018-01-15 MED ORDER — ACETAMINOPHEN 650 MG RE SUPP: 325.0000 mg | RECTAL | Status: DC | PRN

## 2018-01-15 MED ORDER — SODIUM CHLORIDE 0.9 % IV SOLN: 500.0000 mL | Freq: Once | INTRAVENOUS | Status: DC | PRN

## 2018-01-15 MED ORDER — PROTAMINE SULFATE 10 MG/ML IV SOLN
INTRAVENOUS | Status: DC | PRN
  Administered 2018-01-15: 50 mg via INTRAVENOUS

## 2018-01-15 MED ORDER — SODIUM CHLORIDE 0.9 % IV SOLN
INTRAVENOUS | Status: AC
  Filled 2018-01-15: qty 1.2

## 2018-01-15 MED ORDER — SODIUM CHLORIDE 0.9 % IV SOLN
INTRAVENOUS | Status: DC | PRN
  Administered 2018-01-15: 500 mL

## 2018-01-15 MED ORDER — MUPIROCIN 2 % EX OINT
1.0000 "application " | TOPICAL_OINTMENT | Freq: Once | CUTANEOUS | Status: AC
  Administered 2018-01-15: 1 via TOPICAL

## 2018-01-15 MED ORDER — LACTATED RINGERS IV SOLN
INTRAVENOUS | Status: DC | PRN
  Administered 2018-01-15: 08:00:00 via INTRAVENOUS

## 2018-01-15 MED ORDER — MORPHINE SULFATE (PF) 2 MG/ML IV SOLN: 2.0000 mg | INTRAVENOUS | Status: DC | PRN

## 2018-01-15 MED ORDER — MAGNESIUM SULFATE 2 GM/50ML IV SOLN: 2.0000 g | Freq: Every day | INTRAVENOUS | Status: DC | PRN

## 2018-01-15 MED ORDER — METOPROLOL TARTRATE 25 MG PO TABS
50.0000 mg | ORAL_TABLET | Freq: Once | ORAL | Status: DC
  Filled 2018-01-15: qty 2

## 2018-01-15 MED ORDER — PHENYLEPHRINE 40 MCG/ML (10ML) SYRINGE FOR IV PUSH (FOR BLOOD PRESSURE SUPPORT)
PREFILLED_SYRINGE | INTRAVENOUS | Status: DC | PRN
  Administered 2018-01-15 (×2): 80 ug via INTRAVENOUS
  Administered 2018-01-15: 40 ug via INTRAVENOUS

## 2018-01-15 MED ORDER — HEPARIN SODIUM (PORCINE) 1000 UNIT/ML IJ SOLN
INTRAMUSCULAR | Status: DC | PRN
  Administered 2018-01-15: 12000 [IU] via INTRAVENOUS

## 2018-01-15 MED ORDER — ONDANSETRON HCL 4 MG/2ML IJ SOLN: 4.0000 mg | Freq: Four times a day (QID) | INTRAMUSCULAR | Status: DC | PRN

## 2018-01-15 MED ORDER — METOPROLOL TARTRATE 50 MG PO TABS
ORAL_TABLET | ORAL | Status: AC
  Filled 2018-01-15: qty 1

## 2018-01-15 MED ORDER — ALUM & MAG HYDROXIDE-SIMETH 200-200-20 MG/5ML PO SUSP: 15.0000 mL | ORAL | Status: DC | PRN

## 2018-01-15 MED ORDER — NITROGLYCERIN 0.4 MG SL SUBL: 0.4000 mg | SUBLINGUAL_TABLET | SUBLINGUAL | Status: DC | PRN

## 2018-01-15 MED ORDER — DULOXETINE HCL 60 MG PO CPEP
60.0000 mg | ORAL_CAPSULE | Freq: Every day | ORAL | Status: DC
  Administered 2018-01-15: 60 mg via ORAL
  Filled 2018-01-15: qty 1

## 2018-01-15 MED ORDER — FENTANYL CITRATE (PF) 250 MCG/5ML IJ SOLN
INTRAMUSCULAR | Status: AC
  Filled 2018-01-15: qty 5

## 2018-01-15 MED ORDER — METOPROLOL TARTRATE 5 MG/5ML IV SOLN: 2.0000 mg | INTRAVENOUS | Status: DC | PRN

## 2018-01-15 MED ORDER — HYDRALAZINE HCL 20 MG/ML IJ SOLN
5.0000 mg | INTRAMUSCULAR | Status: DC | PRN
  Administered 2018-01-15: 10 mg via INTRAVENOUS
  Filled 2018-01-15: qty 1

## 2018-01-15 MED ORDER — MUPIROCIN 2 % EX OINT
TOPICAL_OINTMENT | CUTANEOUS | Status: AC
  Administered 2018-01-15: 1 via TOPICAL
  Filled 2018-01-15: qty 22

## 2018-01-15 MED ORDER — DEXAMETHASONE SODIUM PHOSPHATE 10 MG/ML IJ SOLN
INTRAMUSCULAR | Status: DC | PRN
  Administered 2018-01-15: 10 mg via INTRAVENOUS

## 2018-01-15 MED ORDER — ONDANSETRON HCL 4 MG/2ML IJ SOLN
INTRAMUSCULAR | Status: DC | PRN
  Administered 2018-01-15: 4 mg via INTRAVENOUS

## 2018-01-15 MED ORDER — POTASSIUM CHLORIDE CRYS ER 20 MEQ PO TBCR: 20.0000 meq | EXTENDED_RELEASE_TABLET | Freq: Every day | ORAL | Status: DC | PRN

## 2018-01-15 MED ORDER — PANTOPRAZOLE SODIUM 40 MG PO TBEC
40.0000 mg | DELAYED_RELEASE_TABLET | Freq: Every day | ORAL | Status: DC
  Administered 2018-01-16: 40 mg via ORAL
  Filled 2018-01-15: qty 1

## 2018-01-15 MED ORDER — CEFAZOLIN SODIUM-DEXTROSE 2-4 GM/100ML-% IV SOLN
2.0000 g | Freq: Three times a day (TID) | INTRAVENOUS | Status: AC
  Administered 2018-01-15 – 2018-01-16 (×2): 2 g via INTRAVENOUS
  Filled 2018-01-15 (×4): qty 100

## 2018-01-15 MED ORDER — ONDANSETRON HCL 4 MG/2ML IJ SOLN: 4.0000 mg | Freq: Once | INTRAMUSCULAR | Status: DC | PRN

## 2018-01-15 MED ORDER — SODIUM CHLORIDE 0.9 % IV SOLN
INTRAVENOUS | Status: DC | PRN
  Administered 2018-01-15: 20 ug/min via INTRAVENOUS

## 2018-01-15 MED ORDER — OXYCODONE HCL 5 MG/5ML PO SOLN: 5.0000 mg | Freq: Once | ORAL | Status: DC | PRN

## 2018-01-15 SURGICAL SUPPLY — 66 items
BAG BANDED W/RUBBER/TAPE 36X54 (MISCELLANEOUS) ×2 IMPLANT
BALLN STERLING RX 5X30X80 (BALLOONS) ×2
BALLOON STERLING RX 5X30X80 (BALLOONS) ×1 IMPLANT
CANISTER SUCT 3000ML PPV (MISCELLANEOUS) ×2 IMPLANT
CANNULA VESSEL 3MM 2 BLNT TIP (CANNULA) ×2 IMPLANT
CATH ROBINSON RED A/P 18FR (CATHETERS) IMPLANT
CLIP VESOCCLUDE MED 6/CT (CLIP) ×2 IMPLANT
CLIP VESOCCLUDE SM WIDE 6/CT (CLIP) ×2 IMPLANT
COVER DOME SNAP 22 D (MISCELLANEOUS) ×2 IMPLANT
COVER PROBE W GEL 5X96 (DRAPES) ×2 IMPLANT
COVER WAND RF STERILE (DRAPES) ×2 IMPLANT
CRADLE DONUT ADULT HEAD (MISCELLANEOUS) ×2 IMPLANT
DECANTER SPIKE VIAL GLASS SM (MISCELLANEOUS) IMPLANT
DERMABOND ADVANCED (GAUZE/BANDAGES/DRESSINGS) ×2
DERMABOND ADVANCED .7 DNX12 (GAUZE/BANDAGES/DRESSINGS) ×2 IMPLANT
DRAIN HEMOVAC 1/8 X 5 (WOUND CARE) IMPLANT
DRAPE INCISE IOBAN 66X45 STRL (DRAPES) ×4 IMPLANT
DRAPE UNIVERSAL PACK (DRAPES) ×2 IMPLANT
ELECT REM PT RETURN 9FT ADLT (ELECTROSURGICAL) ×2
ELECTRODE REM PT RTRN 9FT ADLT (ELECTROSURGICAL) ×1 IMPLANT
EVACUATOR SILICONE 100CC (DRAIN) IMPLANT
GAUZE 4X4 16PLY RFD (DISPOSABLE) ×2 IMPLANT
GLOVE BIO SURGEON STRL SZ7.5 (GLOVE) ×2 IMPLANT
GOWN STRL REUS W/ TWL LRG LVL3 (GOWN DISPOSABLE) ×3 IMPLANT
GOWN STRL REUS W/TWL LRG LVL3 (GOWN DISPOSABLE) ×3
GUIDEWIRE ENROUTE 0.014 (WIRE) ×2 IMPLANT
HEMOSTAT SPONGE AVITENE ULTRA (HEMOSTASIS) IMPLANT
INTRODUCER KIT GALT 7CM (INTRODUCER) ×2
KIT BASIN OR (CUSTOM PROCEDURE TRAY) ×2 IMPLANT
KIT ENCORE 26 ADVANTAGE (KITS) ×2 IMPLANT
KIT INTRODUCER GALT 7 (INTRODUCER) ×2 IMPLANT
KIT TURNOVER KIT B (KITS) ×2 IMPLANT
NEEDLE HYPO 25GX1X1/2 BEV (NEEDLE) IMPLANT
NEEDLE PERC 18GX7CM (NEEDLE) ×2 IMPLANT
NS IRRIG 1000ML POUR BTL (IV SOLUTION) ×4 IMPLANT
PACK CAROTID (CUSTOM PROCEDURE TRAY) ×2 IMPLANT
PAD ARMBOARD 7.5X6 YLW CONV (MISCELLANEOUS) ×4 IMPLANT
PROTECTION STATION PRESSURIZED (MISCELLANEOUS) ×2
SET MICROPUNCTURE 5F STIFF (MISCELLANEOUS) ×2 IMPLANT
SHEATH AVANTI 11CM 5FR (SHEATH) IMPLANT
SHUNT CAROTID BYPASS 10 (VASCULAR PRODUCTS) IMPLANT
SHUNT CAROTID BYPASS 12FRX15.5 (VASCULAR PRODUCTS) IMPLANT
STATION PROTECTION PRESSURIZED (MISCELLANEOUS) ×1 IMPLANT
STENT TRANSCAROTID SYS 10X40 (Permanent Stent) ×2 IMPLANT
STOPCOCK MORSE 400PSI 3WAY (MISCELLANEOUS) ×2 IMPLANT
SUT ETHILON 3 0 PS 1 (SUTURE) IMPLANT
SUT PROLENE 6 0 BV (SUTURE) ×2 IMPLANT
SUT PROLENE 6 0 CC (SUTURE) ×2 IMPLANT
SUT SILK 2 0 PERMA HAND 18 BK (SUTURE) ×2 IMPLANT
SUT SILK 2 0SH CR/8 30 (SUTURE) ×2 IMPLANT
SUT SILK 3 0 TIES 17X18 (SUTURE)
SUT SILK 3-0 18XBRD TIE BLK (SUTURE) IMPLANT
SUT VIC AB 3-0 SH 27 (SUTURE) ×1
SUT VIC AB 3-0 SH 27X BRD (SUTURE) ×1 IMPLANT
SUT VICRYL 4-0 PS2 18IN ABS (SUTURE) ×2 IMPLANT
SYR 10ML LL (SYRINGE) ×6 IMPLANT
SYR 20CC LL (SYRINGE) ×2 IMPLANT
SYR 5ML LL (SYRINGE) ×2 IMPLANT
SYR CONTROL 10ML LL (SYRINGE) IMPLANT
SYSTEM TRANSCAROTID NEUROPRTCT (MISCELLANEOUS) ×1 IMPLANT
TOWEL GREEN STERILE (TOWEL DISPOSABLE) ×2 IMPLANT
TRANSCAROTID NEUROPROTECT SYS (MISCELLANEOUS) ×2
TUBING EXTENTION W/L.L. (IV SETS) ×2 IMPLANT
WATER STERILE IRR 1000ML POUR (IV SOLUTION) ×2 IMPLANT
WIRE AMPLATZ SS-J .035X180CM (WIRE) IMPLANT
WIRE BENTSON .035X145CM (WIRE) ×2 IMPLANT

## 2018-01-15 NOTE — Transfer of Care (Signed)
Immediate Anesthesia Transfer of Care Note  Patient: Jorge [name]  Procedure(s) Performed: Zada Finders ARTERY REVASCULARIZATION LEFT (Left Neck)  Patient Location: PACU  Anesthesia Type:General  Level of Consciousness: awake, alert  and oriented  Airway & Oxygen Therapy: Patient Spontanous Breathing and Patient connected to face mask oxygen  Post-op Assessment: Report given to RN and Post -op Vital signs reviewed and stable  Post vital signs: Reviewed and stable  Last Vitals:  Vitals Value Taken Time  BP 109/62 01/15/2018 10:20 AM  Temp    Pulse 72 01/15/2018 10:21 AM  Resp 14 01/15/2018 10:21 AM  SpO2 99 % 01/15/2018 10:21 AM  Vitals shown include unvalidated device data.  Last Pain:  Vitals:   01/15/18 0649  TempSrc:   PainSc: 0-No pain         Complications: No apparent anesthesia complications

## 2018-01-15 NOTE — Progress Notes (Signed)
[facility] [place]  Subjective  - no complaints   Objective (!) 146/56 (!) 58 97.8 F (36.6 C) (Oral) 15 96%  Intake/Output Summary (Last 24 hours) at 01/15/2018 1418 Last data filed at 01/15/2018 1022 Gross per 24 hour  Intake 1500 ml  Output 50 ml  Net 1450 ml   Awake and alert UE/LE 5/5 motor Neck incision no hematoma Swallow intact  Assessment/Planning: S/p left TCAR Warfarin and aspirin on d/c tomorrow Follow up 2 weeks Most likely d/c am  Fabienne Bruns 01/15/2018 2:18 PM --  Laboratory Lab Results: Recent Labs    01/15/18 0637  WBC 5.0  HGB 11.7*  HCT 38.0*  PLT 259   BMET Recent Labs    01/15/18 0637  NA 139  K 3.5  CL 106  CO2 23  GLUCOSE 118*  BUN 10  CREATININE 1.11  CALCIUM 8.4*    COAG Lab Results  Component Value Date   INR 1.26 01/15/2018   INR 1.26 12/30/2017   INR 1.25 12/29/2017   No results found for: PTT

## 2018-01-15 NOTE — Anesthesia Preprocedure Evaluation (Addendum)
Anesthesia Evaluation  Patient identified by MRN, date of birth, ID band Patient awake    Reviewed: Allergy & Precautions, NPO status , Patient's Chart, lab work & pertinent test results, reviewed documented beta blocker date and time   History of Anesthesia Complications Negative for: history of anesthetic complications  Airway Mallampati: II  TM Distance: >3 FB Neck ROM: Full    Dental  (+) Dental Advisory Given, Missing, Poor Dentition   Pulmonary asthma , sleep apnea (noncompliant) , former smoker,    breath sounds clear to auscultation       Cardiovascular hypertension, Pt. on medications and Pt. on home beta blockers (-) angina+ CAD, + Past MI, + Peripheral Vascular Disease and + Orthopnea  + dysrhythmias Atrial Fibrillation + Valvular Problems/Murmurs  Rhythm:Irregular Rate:Normal   S/p TAVR 2018  '19 Carotid Duplex - Velocities in the right ICA are consistent with a 60-79% stenosis. Velocities in the left ICA are consistent with a 40-59% stenosis.  '19 TTE - Moderate concentric LVH. EF 60% to 65%. Grade 1 diastolic dysfunction. AV prosthesis was present and functioning normally. RV cavity size was mildly dilated.  '18 Cath (pre-TAVR) - Ost Cx to Prox Cx lesion, 40 %stenosed. There is severe aortic valve stenosis.    Neuro/Psych PSYCHIATRIC DISORDERS Anxiety Depression TIACVA (Right sided weakness), Residual Symptoms    GI/Hepatic Neg liver ROS, GERD  Controlled and Medicated,  Endo/Other  Hypothyroidism Morbid obesity  Renal/GU   negative genitourinary   Musculoskeletal negative musculoskeletal ROS (+)   Abdominal (+) + obese,   Peds  Hematology  (+) anemia ,  Warfarin held x 3 days, on lovenox bridge with significant abdominal bruising from injections - surgeon aware     Anesthesia Other Findings   Reproductive/Obstetrics                            Anesthesia  Physical Anesthesia Plan  ASA: IV  Anesthesia Plan: General   Post-op Pain Management:    Induction: Intravenous  PONV Risk Score and Plan: 2 and Treatment may vary due to age or medical condition, Ondansetron and Dexamethasone  Airway Management Planned: Oral ETT  Additional Equipment: Arterial line  Intra-op Plan:   Post-operative Plan: Possible Post-op intubation/ventilation  Informed Consent: I have reviewed the patients History and Physical, chart, labs and discussed the procedure including the risks, benefits and alternatives for the proposed anesthesia with the patient or authorized representative who has indicated his/her understanding and acceptance.   Dental advisory given  Plan Discussed with: CRNA and Anesthesiologist  Anesthesia Plan Comments:        Anesthesia Quick Evaluation

## 2018-01-15 NOTE — Progress Notes (Signed)
Patient has large bruised area on abdomen and the last lovenox injection site is still bleeding.  Patient also has an area on left ear that has been bleeding and has dried blood crusted on it.   Labs are pending.  Dr. Darrick Penna notified.

## 2018-01-15 NOTE — Interval H&P Note (Signed)
History and Physical Interval Note:  01/15/2018 7:23 AM  Jorge [name]  has presented today for surgery, with the diagnosis of BILATERAL CAROTID STENOSIS  The various methods of treatment have been discussed with the patient and family. After consideration of risks, benefits and other options for treatment, the patient has consented to  Procedure(s): TRANSCAROTID ARTERY REVASCULARIZATION LEFT (Left) as a surgical intervention .  The patient's history has been reviewed, patient examined, no change in status, stable for surgery.  I have reviewed the patient's chart and labs.  Questions were answered to the patient's satisfaction.     Fabienne Bruns

## 2018-01-15 NOTE — Anesthesia Postprocedure Evaluation (Signed)
Anesthesia Post Note  Patient: [name]  Procedure(s) Performed: Zada Finders ARTERY REVASCULARIZATION LEFT (Left Neck)     Patient location during evaluation: PACU Anesthesia Type: General Level of consciousness: awake and alert Pain management: pain level controlled Vital Signs Assessment: post-procedure vital signs reviewed and stable Respiratory status: spontaneous breathing, nonlabored ventilation, respiratory function stable and patient connected to nasal cannula oxygen Cardiovascular status: blood pressure returned to baseline and stable Postop Assessment: no apparent nausea or vomiting Anesthetic complications: no    Last Vitals:  Vitals:   01/15/18 1055 01/15/18 1124  BP: (!) 95/58 (!) 146/56  Pulse: (!) 59 (!) 58  Resp: 12 15  Temp:  36.6 C  SpO2: 95% 96%    Last Pain:  Vitals:   01/15/18 1124  TempSrc: Oral  PainSc:                  Beryle Lathe

## 2018-01-15 NOTE — Telephone Encounter (Signed)
sch appt spk to pt mld ltr 01/28/18 11am carotid 1230pm p/o MD

## 2018-01-15 NOTE — Discharge Instructions (Signed)
   [facility]  Discharge Instructions   Carotid Endarterectomy (CEA)  Please refer to the following instructions for your post-procedure care. Your surgeon or physician assistant will discuss any changes with you.  Activity  You are encouraged to walk as much as you can. You can slowly return to normal activities but must avoid strenuous activity and heavy lifting until your doctor tell you it's OK. Avoid activities such as vacuuming or swinging a golf club. You can drive after one week if you are comfortable and you are no longer taking prescription pain medications. It is normal to feel tired for serval weeks after your surgery. It is also normal to have difficulty with sleep habits, eating, and bowel movements after surgery. These will go away with time.  Bathing/Showering  You may shower after you come home. Do not soak in a bathtub, hot tub, or swim until the incision heals completely.  Incision Care  Shower every day. Clean your incision with mild soap and water. Pat the area dry with a clean towel. You do not need a bandage unless otherwise instructed. Do not apply any ointments or creams to your incision. You may have skin glue on your incision. Do not peel it off. It will come off on its own in about one week. Your incision may feel thickened and raised for several weeks after your surgery. This is normal and the skin will soften over time. For Men Only: It's OK to shave around the incision but do not shave the incision itself for 2 weeks. It is common to have numbness under your chin that could last for several months.  Diet  Resume your normal diet. There are no special food restrictions following this procedure. A low fat/low cholesterol diet is recommended for all patients with vascular disease. In order to heal from your surgery, it is CRITICAL to get adequate nutrition. Your body requires vitamins, minerals, and protein. Vegetables are the best  source of vitamins and minerals. Vegetables also provide the perfect balance of protein. Processed food has little nutritional value, so try to avoid this.        Medications  Resume taking all of your medications unless your doctor or physician assistant tells you not to. If your incision is causing pain, you may take over-the- counter pain relievers such as acetaminophen (Tylenol). If you were prescribed a stronger pain medication, please be aware these medications can cause nausea and constipation. Prevent nausea by taking the medication with a snack or meal. Avoid constipation by drinking plenty of fluids and eating foods with a high amount of fiber, such as fruits, vegetables, and grains. Do not take Tylenol if you are taking prescription pain medications.  Follow Up  Our office will schedule a follow up appointment 2-3 weeks following discharge.  Please call us immediately for any of the following conditions  Increased pain, redness, drainage (pus) from your incision site. Fever of 101 degrees or higher. If you should develop stroke (slurred speech, difficulty swallowing, weakness on one side of your body, loss of vision) you should call 911 and go to the nearest emergency room.  Reduce your risk of vascular disease:  Stop smoking. If you would like help call [facility] at 1-800-QUIT-NOW (1-800-784-8669) or [facility] at 336-586-4000. Manage your cholesterol Maintain a desired weight Control your diabetes Keep your blood pressure down  If you have any questions, please call the office at [phone].   

## 2018-01-15 NOTE — Op Note (Signed)
Procedure: Trans carotid artery revascularization (TCAR) left internal carotid  Preoperative diagnosis: Symptomatic right internal carotid artery stenosis  Postoperative diagnosis: Same  Anesthesia: Gen.  Assistant: Fortunato Curling M.D.  Indications: Patient is an 72 year old male who recently sustained a left brain TIA with right arm weakness.  He has a high redo carotid lesion anatomically  Operative findings: #1 90% left internal carotid artery stenosis stented to residual less than 20% stenosis (10 x 40 mm ENROUTE stent) #2 clamp time 9 min 25 sec  #2 right femoral venous access  Operative details: After obtaining informed consent, the patient was taken to the operating room. The patient was placed in supine position upper table. After induction of general anesthesia and endotracheal intubation, Foley catheter was placed. At this point the entire left neck and chest were prepped and draped in usual sterile fashion. The right groin was also prepped and draped in usual sterile fashion. A time out was performed. Next a oblique incision was made between the heads of the sternocleidomastoid muscle on the left side of the neck. Incision was carried down through the platysma to the level of the left internal jugular vein. This was reflected laterally. The vagus nerve was identified and protected. Common carotid artery was dissected free circumferentially at the base of the incision. This was elevated up in the operative field with a vessel loop. Approximately 3-4 cm of the artery was dissected free circumferentially to allow adequate exposure. A pursestring suture was placed in the artery at the area we were planning to puncture using a running 6-0 Prolene suture.At this point femoral venous access was established via the right groin. Ultrasound was used to identify the right common femoral vein. A micropuncture needle was used to cannulate the right common femoral vein and a micropuncture wire advanced  into the vein and the micropuncture sheath placed over this. An 40 Bentsen wire was advanced up into the right femoral venous system. The sheath for the flow reversal system was placed into the right femoral system and thoroughly flushed with heparinized saline. The patient was given 12,000 units of total heparin to establish an ACT greater than 250. At this point a micropuncture needle was used to cannulate the left common carotid artery. The micropuncture wire was advanced just below the level of the carotid bifurcation. The micropuncture sheath was then advanced over this about 3 cm into the common carotid artery. This was thoroughly flushed with heparinized saline.a contrast angiogram was then performed in AP projection to confirm that we were truly intraluminal with the sheath. This was also used to determine the level of the carotid bifurcation.  At this point I decided we were a little bit higher on the common carotid artery than originally intended and I was concerned that the stent would not deploy fully into the common with enough room.  Therefore the micropuncture sheath was removed and the hole was sealed with a pursestring stitch.  I then proceeded to re-puncture the needle with a micropuncture about a centimeter to 2 cm lower on the common carotid artery using a similar technique as above.  Repeat contrast angiogram was performed and confirmed we had enough room at this point.  The carotid Amplatz wire was then placed through the micropuncture sheath and the sheath removed. The 8 French flow reversal arterial sheath was then advanced over the guidewire and into the common carotid artery. Sheath was sutured to the skin with 3 2-0 silk sutures.Contrast angiogram was again performed to make sure  that we were within the true lumen. This was done in 2 views.  The filter device was switched to the high-flow selection. The flow reversal system was then hooked up to the arterial end of the sheath after  everything had been thoroughly flushed and de-aired. Passive flow was used to fill the arterial and of the filter and through the filter and up to the level of the venous sheath. The venous sheath was also fully de-aired and passive flow was established. This was checked by saline infusion in the venous port and noted to flush easily. High flow was then turned on on the filter device. At this point a TCAR timeout was performed to make sure that the patient's blood pressure was reasonable. It was systolic 140 at this point. We again confirmed that the ACT was above 250. The balloon and guidewire insufflator and stent were all selected. These were all prepped. A 5 x 30 mm angioplasty balloon had been selected based on the preoperative CT. Based on the angiogram intraoperatively as well as preoperative CT and 10 x 40 mm Enroute stent was selected.  This was advanced with the balloon over it into the common carotid artery and a guidewire used to selectively catheterize the left internal carotid artery. We confirmed that we were within the internal carotid artery by first establishing that the guidewire advanced into the distal portion of the internal carotid artery and also with contrast angiogram. At this point the 5 x 30 balloon was centered on the lesion and inflated to 8 atm slowly inflating and deflating the balloon. The patient had been given a dose of glycopyrrolate as well as atropine and an additional dose of glycopyrrolate to establish and maintain her blood pressure and heart rate. After predilatation we then brought up the 8 x 40 Enroute stent and advanced and centered this on the lesion.  This was then deployed using a pinch technique after opening the Tuohy Borst valve.  We then gave the patient 3 minutes of additional flow reversal before performing a completion angiogram.  Completion angiogram showed a residual <20% waist.  So a post dilation was not performed. An AP and lateral angiogram were performed  to confirm that the stent was fully deployed after post dilation. At this point the guidewire was removed. The flow reversal system was clamped off at the arterial sheath and disconnected. The remaining blood was returned to the patient. This was then disconnected from the venous sheath. The arterial sheath was removed and the pursestring suture cinched down. The patient was given 50 mg of protamine to achieve hemostasis. The venous sheath was pulled and hemostasis obtained with direct pressure. The carotid was inspected found to be without any area of hematoma or active bleeding.  The platysma muscle was reapproximated using a running 3-0 Vicryl suture. The skin was closed with a 4-0 Vicryl subcuticular stitch. Dermabond was applied to the incision. Patient was awakened in the operating room and moving her upper and lower extremities symmetrically. He was taken to the recovery room in stable condition.  Charles Fields, MD [facility] Office: [phone] Pager: [phone]     

## 2018-01-15 NOTE — Anesthesia Procedure Notes (Addendum)
Procedure Name: Intubation Performed by: Beryle Lathe, MD Pre-anesthesia Checklist: Patient identified, Emergency Drugs available, Suction available and Patient being monitored Patient Re-evaluated:Patient Re-evaluated prior to induction Oxygen Delivery Method: Circle System Utilized Preoxygenation: Pre-oxygenation with 100% oxygen Induction Type: IV induction Ventilation: Mask ventilation without difficulty Laryngoscope Size: Miller and 3 Grade View: Grade II Tube type: Oral Tube size: 7.5 mm Number of attempts: 2 Airway Equipment and Method: Stylet Placement Confirmation: ETT inserted through vocal cords under direct vision,  positive ETCO2 and breath sounds checked- equal and bilateral Secured at: 23 cm Tube secured with: Tape Dental Injury: Teeth and Oropharynx as per pre-operative assessment

## 2018-01-15 NOTE — Anesthesia Procedure Notes (Signed)
Arterial Line Insertion Start/End10/02/2018 7:05 AM, 01/15/2018 7:20 AM Performed by: Beryle Lathe, MD, Rosiland Oz, CRNA, CRNA  Patient location: Pre-op. Preanesthetic checklist: patient identified, IV checked, site marked, risks and benefits discussed, surgical consent, monitors and equipment checked, pre-op evaluation, timeout performed and anesthesia consent Lidocaine 1% used for infiltration Left, radial was placed Catheter size: 20 G Hand hygiene performed , maximum sterile barriers used  and Seldinger technique used Allen's test indicative of satisfactory collateral circulation Attempts: 2 Procedure performed without using ultrasound guided technique. Following insertion, Biopatch. Post procedure assessment: normal and unchanged  Patient tolerated the procedure well with no immediate complications.

## 2018-01-16 LAB — BASIC METABOLIC PANEL
Anion gap: 8 (ref 5–15)
BUN: 10 mg/dL (ref 8–23)
CO2: 23 mmol/L (ref 22–32)
CREATININE: 1.06 mg/dL (ref 0.61–1.24)
Calcium: 8 mg/dL — ABNORMAL LOW (ref 8.9–10.3)
Chloride: 107 mmol/L (ref 98–111)
GFR calc Af Amer: >60 mL/min (ref >60)
GFR calc non Af Amer: >60 mL/min (ref >60)
GLUCOSE: 142 mg/dL — AB (ref 70–99)
Potassium: 3.9 mmol/L (ref 3.5–5.1)
Sodium: 138 mmol/L (ref 135–145)

## 2018-01-16 LAB — CBC
HCT: 34.5 % — ABNORMAL LOW (ref 39.0–52.0)
Hemoglobin: 10.7 g/dL — ABNORMAL LOW (ref 13.0–17.0)
MCH: 31.1 pg (ref 26.0–34.0)
MCHC: 31 g/dL (ref 30.0–36.0)
MCV: 100.3 fL — ABNORMAL HIGH (ref 80.0–100.0)
Platelets: 238 K/uL (ref 150–400)
RBC: 3.44 MIL/uL — ABNORMAL LOW (ref 4.22–5.81)
RDW: 15.4 % (ref 11.5–15.5)
WBC: 10.9 K/uL — ABNORMAL HIGH (ref 4.0–10.5)
nRBC: 0 % (ref 0.0–0.2)

## 2018-01-16 NOTE — Discharge Summary (Signed)
Physician Discharge Summary  Patient ID: Jorge [name] MRN: [id number] DOB/AGE: [id number] 72 y.o.  Admit date: 01/15/2018 Discharge date: 01/16/2018  Admission Diagnosis: Symptomatic recurrent left carotid stenosis  Discharge Diagnoses:  Same  Secondary Diagnoses: Active Problems:   Carotid artery stenosis   Procedures: Left trans-carotid artery revascularization  Discharged Condition: good  Hospital Course: Patient had left trans-carotid artery revascularization.  He did very well from this procedure was discharged on postoperative day 1   Significant Diagnostic Studies: CBC CBC Latest Ref Rng & Units 01/16/2018 01/15/2018 12/30/2017  WBC 4.0 - 10.5 K/uL 10.9(H) 5.0 8.3  Hemoglobin 13.0 - 17.0 g/dL 10.7(L) 11.7(L) 10.8(L)  Hematocrit 39.0 - 52.0 % 34.5(L) 38.0(L) 34.2(L)  Platelets 150 - 400 K/uL 238 259 289      COAG Lab Results  Component Value Date   INR 1.26 01/15/2018   INR 1.26 12/30/2017   INR 1.25 12/29/2017   No results found for: PTT  Disposition: Discharge disposition: 01-Home or Self Care       Discharge Instructions    Call MD for:  redness, tenderness, or signs of infection (pain, swelling, bleeding, redness, odor or green/yellow discharge around incision site)   Complete by:  As directed    Call MD for:  severe or increased pain, loss or decreased feeling  in affected limb(s)   Complete by:  As directed    Call MD for:  temperature >100.5   Complete by:  As directed    Resume previous diet   Complete by:  As directed      Allergies as of 01/16/2018   No Known Allergies     Medication List    TAKE these medications   acetaminophen 325 MG tablet Commonly known as:  TYLENOL Take 2 tablets (650 mg total) by mouth every 4 (four) hours as needed for headache or mild pain.   amiodarone 200 MG tablet Commonly known as:  PACERONE Take 1 tablet (200 mg total) by mouth daily. What changed:  additional instructions   aspirin EC 81  MG tablet Take 81 mg by mouth daily. (0800)   atorvastatin 40 MG tablet Commonly known as:  LIPITOR Take 40 mg by mouth daily at 6 PM.   bisacodyl 5 MG EC tablet Commonly known as:  DULCOLAX Take 5 mg by mouth daily as needed for moderate constipation.   DULoxetine 60 MG capsule Commonly known as:  CYMBALTA Take 60 mg by mouth daily at 6 PM.   fluticasone 50 MCG/ACT nasal spray Commonly known as:  FLONASE Place 1 spray into both nostrils daily as needed for allergies.   furosemide 40 MG tablet Commonly known as:  LASIX Take 1 tablet (40 mg total) by mouth daily. What changed:  additional instructions   gabapentin 800 MG tablet Commonly known as:  NEURONTIN Take 800 mg by mouth at bedtime. (2100)   HYDROcodone-acetaminophen 5-325 MG tablet Commonly known as:  NORCO/VICODIN Take 1-2 tablets by mouth every 6 (six) hours as needed for severe pain.   levothyroxine 112 MCG tablet Commonly known as:  SYNTHROID, LEVOTHROID Take 112 mcg by mouth daily at 6 (six) AM.   LOVENOX 150 MG/ML injection Generic drug:  enoxaparin Inject 150 mg into the skin every 12 (twelve) hours.   Melatonin 5 MG Tabs Take 5 mg by mouth at bedtime. (2100)   metoprolol tartrate 50 MG tablet Commonly known as:  LOPRESSOR Take 50 mg by mouth 2 (two) times daily. (0900 & 1700)   nitroGLYCERIN  0.4 MG SL tablet Commonly known as:  NITROSTAT Place 1 tablet (0.4 mg total) under the tongue every 5 (five) minutes as needed for chest pain.   omeprazole 40 MG capsule Commonly known as:  PRILOSEC Take 40 mg by mouth daily as needed (indigestion).   polyethylene glycol powder powder Commonly known as:  GLYCOLAX/MIRALAX Take 17 g by mouth daily. What changed:  additional instructions   traMADol 50 MG tablet Commonly known as:  ULTRAM Take 100 mg by mouth 3 (three) times daily as needed for moderate pain.   warfarin 3 MG tablet Commonly known as:  COUMADIN Take 9 mg by mouth every other day.  (1700)   warfarin 4 MG tablet Commonly known as:  COUMADIN Take 8 mg by mouth every other day. (1700)   warfarin 5 MG tablet Commonly known as:  COUMADIN Take 1.5 tablets (7.5 mg total) by mouth daily. Take as directed by PCP      Follow-up Information    Sherren Kerns, MD Follow up in 2 week(s).   Specialties:  Vascular Surgery, Cardiology Why:  office will call the patient Contact information: [address] [place]  [address] [phone]           Luanna Salk. Randie Heinz, MD [facility] Office: [phone] Pager: [phone]   01/16/2018, 10:25 AM

## 2018-01-16 NOTE — Progress Notes (Signed)
  Progress Note    01/16/2018 10:18 AM 1 Day Post-Op  Subjective: No overnight issues  Vitals:   01/16/18 0500 01/16/18 0741  BP:  103/63  Pulse:  66  Resp: 13 15  Temp:  97.9 F (36.6 C)  SpO2:  98%    Physical Exam: Awake alert oriented Left neck incision clean dry intact with minimal hematoma Moving all extremities well without limitation  CBC    Component Value Date/Time   WBC 10.9 (H) 01/16/2018 0400   RBC 3.44 (L) 01/16/2018 0400   HGB 10.7 (L) 01/16/2018 0400   HGB 14.2 01/14/2017 0852   HCT 34.5 (L) 01/16/2018 0400   HCT 42.3 01/14/2017 0852   PLT 238 01/16/2018 0400   PLT 242 01/14/2017 0852   MCV 100.3 (H) 01/16/2018 0400   MCV 93 01/14/2017 0852   MCH 31.1 01/16/2018 0400   MCHC 31.0 01/16/2018 0400   RDW 15.4 01/16/2018 0400   RDW 13.6 01/14/2017 0852   LYMPHSABS 1.6 12/25/2017 0630   LYMPHSABS 1.2 01/14/2017 0852   MONOABS 1.6 (H) 12/25/2017 0630   EOSABS 0.0 12/25/2017 0630   EOSABS 0.2 01/14/2017 0852   BASOSABS 0.0 12/25/2017 0630   BASOSABS 0.0 01/14/2017 0852    BMET    Component Value Date/Time   NA 138 01/16/2018 0400   NA 142 01/14/2017 0852   K 3.9 01/16/2018 0400   CL 107 01/16/2018 0400   CO2 23 01/16/2018 0400   GLUCOSE 142 (H) 01/16/2018 0400   BUN 10 01/16/2018 0400   BUN 18 01/14/2017 0852   CREATININE 1.06 01/16/2018 0400   CALCIUM 8.0 (L) 01/16/2018 0400   GFRNONAA >60 01/16/2018 0400   GFRAA >60 01/16/2018 0400    INR    Component Value Date/Time   INR 1.26 01/15/2018 0637     Intake/Output Summary (Last 24 hours) at 01/16/2018 1018 Last data filed at 01/16/2018 0900 Gross per 24 hour  Intake 2236.7 ml  Output 300 ml  Net 1936.7 ml     Assessment:  72 y.o. male is s/p left trans-carotid artery stenting for recurrent disease Plan: Discharge today with plan to follow-up with Dr. Darrick Penna.   Wilman Tucker C. Randie Heinz, MD [facility] Office: [phone] Pager:  [phone]  01/16/2018 10:18 AM

## 2018-01-16 NOTE — Progress Notes (Signed)
Discharged to home with family office visits in place teaching done  

## 2018-01-18 ENCOUNTER — Encounter (HOSPITAL_COMMUNITY): Payer: Self-pay | Admitting: Vascular Surgery

## 2018-01-26 ENCOUNTER — Other Ambulatory Visit: Payer: Self-pay

## 2018-01-26 DIAGNOSIS — I6523 Occlusion and stenosis of bilateral carotid arteries: Secondary | ICD-10-CM

## 2018-01-28 ENCOUNTER — Ambulatory Visit (HOSPITAL_COMMUNITY)
Admission: RE | Admit: 2018-01-28 | Discharge: 2018-01-28 | Disposition: A | Discharge status: Home / Self Care | Payer: Medicare Other | Source: Ambulatory Visit | Attending: Vascular Surgery | Admitting: Vascular Surgery

## 2018-01-28 ENCOUNTER — Other Ambulatory Visit: Payer: Self-pay

## 2018-01-28 ENCOUNTER — Ambulatory Visit (INDEPENDENT_AMBULATORY_CARE_PROVIDER_SITE_OTHER): Payer: Self-pay | Admitting: Vascular Surgery

## 2018-01-28 ENCOUNTER — Encounter: Payer: Self-pay | Admitting: Vascular Surgery

## 2018-01-28 VITALS — BP 102/75 | HR 62 | Temp 97.6°F | Resp 16 | Ht 72.0 in | Wt 315.6 lb

## 2018-01-28 DIAGNOSIS — I6523 Occlusion and stenosis of bilateral carotid arteries: Secondary | ICD-10-CM | POA: Diagnosis present

## 2018-01-28 NOTE — Progress Notes (Signed)
Patient is a 72 year old male who returns for postoperative follow-up today.  He recently underwent TCAR stenting left carotid artery.  This was done for a redo stenosis that was symptomatic with right arm symptoms.  He was also having some dizziness.  He states the dizziness symptoms have completely resolved.  He has had no further TIA events.  He does have a known moderate right internal carotid artery stenosis and right subclavian stenosis which seem to be asymptomatic currently.  He is on warfarin and aspirin.  He is also on a statin.  Current Outpatient Medications on File Prior to Visit  Medication Sig Dispense Refill  . acetaminophen (TYLENOL) 325 MG tablet Take 2 tablets (650 mg total) by mouth every 4 (four) hours as needed for headache or mild pain.    Marland Kitchen amiodarone (PACERONE) 200 MG tablet Take 1 tablet (200 mg total) by mouth daily. (Patient taking differently: Take 200 mg by mouth daily. (0800)) 90 tablet 1  . aspirin EC 81 MG tablet Take 81 mg by mouth daily. (0800)    . atorvastatin (LIPITOR) 40 MG tablet Take 40 mg by mouth daily at 6 PM.     . bisacodyl (DULCOLAX) 5 MG EC tablet Take 5 mg by mouth daily as needed for moderate constipation.    . DULoxetine (CYMBALTA) 60 MG capsule Take 60 mg by mouth daily at 6 PM.     . enoxaparin (LOVENOX) 150 MG/ML injection Inject 150 mg into the skin every 12 (twelve) hours.    . fluticasone (FLONASE) 50 MCG/ACT nasal spray Place 1 spray into both nostrils daily as needed for allergies.   10  . furosemide (LASIX) 40 MG tablet Take 1 tablet (40 mg total) by mouth daily. (Patient taking differently: Take 40 mg by mouth daily. (0800)) 30 tablet 0  . gabapentin (NEURONTIN) 800 MG tablet Take 800 mg by mouth at bedtime. (2100)    . HYDROcodone-acetaminophen (NORCO/VICODIN) 5-325 MG tablet Take 1-2 tablets by mouth every 6 (six) hours as needed for severe pain. 10 tablet 0  . levothyroxine (SYNTHROID, LEVOTHROID) 112 MCG tablet Take 112 mcg by mouth  daily at 6 (six) AM.    . Melatonin 5 MG TABS Take 5 mg by mouth at bedtime. (2100)    . metoprolol tartrate (LOPRESSOR) 50 MG tablet Take 50 mg by mouth 2 (two) times daily. (0900 & 1700)    . nitroGLYCERIN (NITROSTAT) 0.4 MG SL tablet Place 1 tablet (0.4 mg total) under the tongue every 5 (five) minutes as needed for chest pain. 20 tablet 1  . omeprazole (PRILOSEC) 40 MG capsule Take 40 mg by mouth daily as needed (indigestion).     . polyethylene glycol powder (GLYCOLAX/MIRALAX) powder Take 17 g by mouth daily. (Patient taking differently: Take 17 g by mouth daily. (0800)) 255 g 0  . traMADol (ULTRAM) 50 MG tablet Take 100 mg by mouth 3 (three) times daily as needed for moderate pain.     Marland Kitchen warfarin (COUMADIN) 5 MG tablet Take 1.5 tablets (7.5 mg total) by mouth daily. Take as directed by PCP    . warfarin (COUMADIN) 3 MG tablet Take 9 mg by mouth every other day. (1700)    . warfarin (COUMADIN) 4 MG tablet Take 8 mg by mouth every other day. (1700)     No current facility-administered medications on file prior to visit.      Physical exam:  Vitals:   01/28/18 1204 01/28/18 1207  BP: 129/70 102/75  Pulse:  62   Resp: 16   Temp: 97.6 F (36.4 C)   TempSrc: Oral   SpO2: 95%   Weight: (!) 315 lb 9.6 oz (143.2 kg)   Height: 6' (1.829 m)     Neck: Healing left neck incision small hematoma with some ecchymosis nonfluctuant nonerythematous no drainage  Neuro: Symmetric upper extremity lower extremity motor strength which is 5/5 no facial asymmetry  Data: Patient had a carotid duplex exam today which showed the left carotid stent is patent.  There is about a 30% narrowing on grayscale images at the origin of the internal carotid artery.  This is similar to the findings intraoperatively where we left a small waist in the central portion of the stent so that a post dilation would not need to be performed.  At the time of operation I called this a 20% stenosis area I believe the duplex today  is similar.  Assessment: Doing well status post left carotid stent TCAR for symptomatic left internal carotid artery recurrent stenosis.  Moderate right internal carotid and right subclavian stenosis currently asymptomatic.  Plan: Follow-up carotid duplex scan 6 months time and see our nurse practitioner.  He will return sooner if he develops any symptoms related to neurologic findings.  Fabienne Bruns, MD [facility] Office: [phone] Pager: [phone]

## 2018-02-21 ENCOUNTER — Emergency Department (HOSPITAL_COMMUNITY): Payer: Medicare Other

## 2018-02-21 ENCOUNTER — Inpatient Hospital Stay (HOSPITAL_COMMUNITY)
Admission: EM | Admit: 2018-02-21 | Discharge: 2018-02-27 | DRG: 871 | Disposition: A | Discharge status: Home Health | Payer: Medicare Other | Attending: Internal Medicine | Admitting: Internal Medicine

## 2018-02-21 ENCOUNTER — Encounter (HOSPITAL_COMMUNITY): Payer: Self-pay | Admitting: Emergency Medicine

## 2018-02-21 ENCOUNTER — Other Ambulatory Visit: Payer: Self-pay

## 2018-02-21 DIAGNOSIS — I10 Essential (primary) hypertension: Secondary | ICD-10-CM

## 2018-02-21 DIAGNOSIS — E039 Hypothyroidism, unspecified: Secondary | ICD-10-CM | POA: Diagnosis present

## 2018-02-21 DIAGNOSIS — N179 Acute kidney failure, unspecified: Secondary | ICD-10-CM

## 2018-02-21 DIAGNOSIS — D638 Anemia in other chronic diseases classified elsewhere: Secondary | ICD-10-CM | POA: Diagnosis present

## 2018-02-21 DIAGNOSIS — I5033 Acute on chronic diastolic (congestive) heart failure: Secondary | ICD-10-CM | POA: Diagnosis not present

## 2018-02-21 DIAGNOSIS — A4151 Sepsis due to Escherichia coli [E. coli]: Principal | ICD-10-CM | POA: Diagnosis present

## 2018-02-21 DIAGNOSIS — I130 Hypertensive heart and chronic kidney disease with heart failure and stage 1 through stage 4 chronic kidney disease, or unspecified chronic kidney disease: Secondary | ICD-10-CM | POA: Diagnosis present

## 2018-02-21 DIAGNOSIS — I7389 Other specified peripheral vascular diseases: Secondary | ICD-10-CM | POA: Diagnosis present

## 2018-02-21 DIAGNOSIS — N400 Enlarged prostate without lower urinary tract symptoms: Secondary | ICD-10-CM | POA: Diagnosis present

## 2018-02-21 DIAGNOSIS — R7881 Bacteremia: Secondary | ICD-10-CM | POA: Diagnosis not present

## 2018-02-21 DIAGNOSIS — R6521 Severe sepsis with septic shock: Secondary | ICD-10-CM

## 2018-02-21 DIAGNOSIS — J45909 Unspecified asthma, uncomplicated: Secondary | ICD-10-CM | POA: Diagnosis present

## 2018-02-21 DIAGNOSIS — R6520 Severe sepsis without septic shock: Secondary | ICD-10-CM

## 2018-02-21 DIAGNOSIS — E872 Acidosis: Secondary | ICD-10-CM | POA: Diagnosis present

## 2018-02-21 DIAGNOSIS — E785 Hyperlipidemia, unspecified: Secondary | ICD-10-CM | POA: Diagnosis present

## 2018-02-21 DIAGNOSIS — G4734 Idiopathic sleep related nonobstructive alveolar hypoventilation: Secondary | ICD-10-CM | POA: Diagnosis not present

## 2018-02-21 DIAGNOSIS — I480 Paroxysmal atrial fibrillation: Secondary | ICD-10-CM | POA: Diagnosis present

## 2018-02-21 DIAGNOSIS — G4733 Obstructive sleep apnea (adult) (pediatric): Secondary | ICD-10-CM | POA: Diagnosis present

## 2018-02-21 DIAGNOSIS — N12 Tubulo-interstitial nephritis, not specified as acute or chronic: Secondary | ICD-10-CM | POA: Diagnosis not present

## 2018-02-21 DIAGNOSIS — Z9989 Dependence on other enabling machines and devices: Secondary | ICD-10-CM | POA: Diagnosis not present

## 2018-02-21 DIAGNOSIS — G92 Toxic encephalopathy: Secondary | ICD-10-CM | POA: Diagnosis present

## 2018-02-21 DIAGNOSIS — I5032 Chronic diastolic (congestive) heart failure: Secondary | ICD-10-CM | POA: Diagnosis present

## 2018-02-21 DIAGNOSIS — Z953 Presence of xenogenic heart valve: Secondary | ICD-10-CM

## 2018-02-21 DIAGNOSIS — N183 Chronic kidney disease, stage 3 unspecified: Secondary | ICD-10-CM

## 2018-02-21 DIAGNOSIS — N10 Acute tubulo-interstitial nephritis: Secondary | ICD-10-CM | POA: Diagnosis present

## 2018-02-21 DIAGNOSIS — Z8673 Personal history of transient ischemic attack (TIA), and cerebral infarction without residual deficits: Secondary | ICD-10-CM

## 2018-02-21 DIAGNOSIS — I6930 Unspecified sequelae of cerebral infarction: Secondary | ICD-10-CM

## 2018-02-21 DIAGNOSIS — N401 Enlarged prostate with lower urinary tract symptoms: Secondary | ICD-10-CM

## 2018-02-21 DIAGNOSIS — I2510 Atherosclerotic heart disease of native coronary artery without angina pectoris: Secondary | ICD-10-CM

## 2018-02-21 DIAGNOSIS — R319 Hematuria, unspecified: Secondary | ICD-10-CM | POA: Diagnosis not present

## 2018-02-21 DIAGNOSIS — N170 Acute kidney failure with tubular necrosis: Secondary | ICD-10-CM | POA: Diagnosis present

## 2018-02-21 DIAGNOSIS — Z7982 Long term (current) use of aspirin: Secondary | ICD-10-CM

## 2018-02-21 DIAGNOSIS — Z7901 Long term (current) use of anticoagulants: Secondary | ICD-10-CM

## 2018-02-21 DIAGNOSIS — R791 Abnormal coagulation profile: Secondary | ICD-10-CM | POA: Diagnosis not present

## 2018-02-21 DIAGNOSIS — I350 Nonrheumatic aortic (valve) stenosis: Secondary | ICD-10-CM

## 2018-02-21 DIAGNOSIS — Z87891 Personal history of nicotine dependence: Secondary | ICD-10-CM

## 2018-02-21 DIAGNOSIS — R109 Unspecified abdominal pain: Secondary | ICD-10-CM | POA: Diagnosis present

## 2018-02-21 DIAGNOSIS — K219 Gastro-esophageal reflux disease without esophagitis: Secondary | ICD-10-CM | POA: Diagnosis present

## 2018-02-21 DIAGNOSIS — T45515A Adverse effect of anticoagulants, initial encounter: Secondary | ICD-10-CM | POA: Diagnosis not present

## 2018-02-21 DIAGNOSIS — Z79899 Other long term (current) drug therapy: Secondary | ICD-10-CM

## 2018-02-21 DIAGNOSIS — Z6841 Body Mass Index (BMI) 40.0 and over, adult: Secondary | ICD-10-CM

## 2018-02-21 DIAGNOSIS — R0600 Dyspnea, unspecified: Secondary | ICD-10-CM

## 2018-02-21 DIAGNOSIS — A419 Sepsis, unspecified organism: Secondary | ICD-10-CM | POA: Diagnosis not present

## 2018-02-21 DIAGNOSIS — E875 Hyperkalemia: Secondary | ICD-10-CM | POA: Diagnosis not present

## 2018-02-21 LAB — URINALYSIS, ROUTINE W REFLEX MICROSCOPIC
Bilirubin Urine: NEGATIVE
Glucose, UA: NEGATIVE mg/dL
Ketones, ur: NEGATIVE mg/dL
Nitrite: NEGATIVE
PROTEIN: 100 mg/dL — AB
RBC / HPF: >50 RBC/hpf — ABNORMAL HIGH (ref 0–5)
Specific Gravity, Urine: 1.015 (ref 1.005–1.030)
WBC, UA: >50 WBC/hpf — ABNORMAL HIGH (ref 0–5)
pH: 5 (ref 5.0–8.0)

## 2018-02-21 LAB — COMPREHENSIVE METABOLIC PANEL
ALBUMIN: 3 g/dL — AB (ref 3.5–5.0)
ALK PHOS: 132 U/L — AB (ref 38–126)
ALT: 27 U/L (ref 0–44)
ANION GAP: 18 — AB (ref 5–15)
AST: 31 U/L (ref 15–41)
BILIRUBIN TOTAL: 1.1 mg/dL (ref 0.3–1.2)
BUN: 47 mg/dL — AB (ref 8–23)
CALCIUM: 8 mg/dL — AB (ref 8.9–10.3)
CO2: 20 mmol/L — ABNORMAL LOW (ref 22–32)
Chloride: 93 mmol/L — ABNORMAL LOW (ref 98–111)
Creatinine, Ser: 4.23 mg/dL — ABNORMAL HIGH (ref 0.61–1.24)
GFR calc Af Amer: 15 mL/min — ABNORMAL LOW (ref >60)
GFR, EST NON AFRICAN AMERICAN: 13 mL/min — AB (ref >60)
GLUCOSE: 173 mg/dL — AB (ref 70–99)
Potassium: 4 mmol/L (ref 3.5–5.1)
Sodium: 131 mmol/L — ABNORMAL LOW (ref 135–145)
TOTAL PROTEIN: 6.6 g/dL (ref 6.5–8.1)

## 2018-02-21 LAB — CBC WITH DIFFERENTIAL/PLATELET
ABS IMMATURE GRANULOCYTES: 0.48 10*3/uL — AB (ref 0.00–0.07)
BASOS PCT: 0 %
Basophils Absolute: 0 10*3/uL (ref 0.0–0.1)
EOS PCT: 0 %
Eosinophils Absolute: 0.1 10*3/uL (ref 0.0–0.5)
HEMATOCRIT: 42.8 % (ref 39.0–52.0)
Hemoglobin: 13.4 g/dL (ref 13.0–17.0)
Immature Granulocytes: 2 %
Lymphocytes Relative: 2 %
Lymphs Abs: 0.3 10*3/uL — ABNORMAL LOW (ref 0.7–4.0)
MCH: 30.1 pg (ref 26.0–34.0)
MCHC: 31.3 g/dL (ref 30.0–36.0)
MCV: 96.2 fL (ref 80.0–100.0)
MONO ABS: 2.1 10*3/uL — AB (ref 0.1–1.0)
MONOS PCT: 10 %
Neutro Abs: 18.8 10*3/uL — ABNORMAL HIGH (ref 1.7–7.7)
Neutrophils Relative %: 86 %
Platelets: 217 10*3/uL (ref 150–400)
RBC: 4.45 MIL/uL (ref 4.22–5.81)
RDW: 13.8 % (ref 11.5–15.5)
WBC: 21.8 10*3/uL — AB (ref 4.0–10.5)
nRBC: 0 % (ref 0.0–0.2)

## 2018-02-21 LAB — I-STAT TROPONIN, ED: TROPONIN I, POC: 0.03 ng/mL (ref 0.00–0.08)

## 2018-02-21 LAB — POCT I-STAT 3, ART BLOOD GAS (G3+)
ACID-BASE DEFICIT: 6 mmol/L — AB (ref 0.0–2.0)
Bicarbonate: 18.5 mmol/L — ABNORMAL LOW (ref 20.0–28.0)
O2 Saturation: 96 %
PCO2 ART: 32.7 mmHg (ref 32.0–48.0)
PO2 ART: 84 mmHg (ref 83.0–108.0)
Patient temperature: 98
TCO2: 19 mmol/L — ABNORMAL LOW (ref 22–32)
pH, Arterial: 7.358 (ref 7.350–7.450)

## 2018-02-21 LAB — BLOOD CULTURE ID PANEL (REFLEXED)
Acinetobacter baumannii: NOT DETECTED
CANDIDA ALBICANS: NOT DETECTED
CARBAPENEM RESISTANCE: NOT DETECTED
Candida glabrata: NOT DETECTED
Candida krusei: NOT DETECTED
Candida parapsilosis: NOT DETECTED
Candida tropicalis: NOT DETECTED
ENTEROBACTER CLOACAE COMPLEX: NOT DETECTED
ENTEROBACTERIACEAE SPECIES: DETECTED — AB
ENTEROCOCCUS SPECIES: NOT DETECTED
Escherichia coli: DETECTED — AB
Haemophilus influenzae: NOT DETECTED
KLEBSIELLA OXYTOCA: NOT DETECTED
Klebsiella pneumoniae: NOT DETECTED
LISTERIA MONOCYTOGENES: NOT DETECTED
Neisseria meningitidis: NOT DETECTED
PSEUDOMONAS AERUGINOSA: NOT DETECTED
Proteus species: NOT DETECTED
STAPHYLOCOCCUS AUREUS BCID: NOT DETECTED
STREPTOCOCCUS PNEUMONIAE: NOT DETECTED
STREPTOCOCCUS PYOGENES: NOT DETECTED
STREPTOCOCCUS SPECIES: NOT DETECTED
Serratia marcescens: NOT DETECTED
Staphylococcus species: NOT DETECTED
Streptococcus agalactiae: NOT DETECTED

## 2018-02-21 LAB — BASIC METABOLIC PANEL
Anion gap: 10 (ref 5–15)
BUN: 54 mg/dL — ABNORMAL HIGH (ref 8–23)
CALCIUM: 6.7 mg/dL — AB (ref 8.9–10.3)
CO2: 19 mmol/L — ABNORMAL LOW (ref 22–32)
CREATININE: 4.68 mg/dL — AB (ref 0.61–1.24)
Chloride: 104 mmol/L (ref 98–111)
GFR calc Af Amer: 13 mL/min — ABNORMAL LOW (ref >60)
GFR, EST NON AFRICAN AMERICAN: 11 mL/min — AB (ref >60)
GLUCOSE: 141 mg/dL — AB (ref 70–99)
Potassium: 4.6 mmol/L (ref 3.5–5.1)
Sodium: 133 mmol/L — ABNORMAL LOW (ref 135–145)

## 2018-02-21 LAB — I-STAT CG4 LACTIC ACID, ED
LACTIC ACID, VENOUS: 2.53 mmol/L — AB (ref 0.5–1.9)
Lactic Acid, Venous: 1.81 mmol/L (ref 0.5–1.9)

## 2018-02-21 LAB — PROTIME-INR
INR: 2.96
PROTHROMBIN TIME: 30.4 s — AB (ref 11.4–15.2)

## 2018-02-21 LAB — BRAIN NATRIURETIC PEPTIDE: B NATRIURETIC PEPTIDE 5: 119.9 pg/mL — AB (ref 0.0–100.0)

## 2018-02-21 LAB — GLUCOSE, CAPILLARY: Glucose-Capillary: 101 mg/dL — ABNORMAL HIGH (ref 70–99)

## 2018-02-21 LAB — MRSA PCR SCREENING: MRSA by PCR: NEGATIVE

## 2018-02-21 MED ORDER — VANCOMYCIN HCL IN DEXTROSE 1-5 GM/200ML-% IV SOLN: 1000.0000 mg | Freq: Once | INTRAVENOUS | Status: DC

## 2018-02-21 MED ORDER — VANCOMYCIN HCL 10 G IV SOLR: 2000.0000 mg | INTRAVENOUS | Status: DC

## 2018-02-21 MED ORDER — DULOXETINE HCL 60 MG PO CPEP
60.0000 mg | ORAL_CAPSULE | Freq: Every day | ORAL | Status: DC
  Administered 2018-02-21 – 2018-02-26 (×6): 60 mg via ORAL
  Filled 2018-02-21 (×7): qty 1

## 2018-02-21 MED ORDER — SODIUM CHLORIDE 0.9 % IV SOLN
1.0000 g | INTRAVENOUS | Status: DC
  Administered 2018-02-22: 1 g via INTRAVENOUS
  Filled 2018-02-21: qty 1

## 2018-02-21 MED ORDER — PHENTOLAMINE MESYLATE 5 MG IJ SOLR
5.0000 mg | Freq: Once | INTRAMUSCULAR | Status: DC
  Filled 2018-02-21: qty 5

## 2018-02-21 MED ORDER — NOREPINEPHRINE 4 MG/250ML-% IV SOLN
0.0000 ug/min | INTRAVENOUS | Status: DC
  Filled 2018-02-21: qty 250

## 2018-02-21 MED ORDER — METRONIDAZOLE IN NACL 5-0.79 MG/ML-% IV SOLN
500.0000 mg | Freq: Three times a day (TID) | INTRAVENOUS | Status: DC
  Filled 2018-02-21: qty 100

## 2018-02-21 MED ORDER — WARFARIN - PHARMACIST DOSING INPATIENT: Freq: Every day | Status: DC

## 2018-02-21 MED ORDER — VANCOMYCIN HCL 10 G IV SOLR
2500.0000 mg | Freq: Once | INTRAVENOUS | Status: AC
  Administered 2018-02-21: 2500 mg via INTRAVENOUS
  Filled 2018-02-21: qty 2500

## 2018-02-21 MED ORDER — SODIUM CHLORIDE 0.9 % IV SOLN
INTRAVENOUS | Status: DC
  Administered 2018-02-21: 11:00:00 via INTRAVENOUS

## 2018-02-21 MED ORDER — MELATONIN 3 MG PO TABS
3.0000 mg | ORAL_TABLET | Freq: Every day | ORAL | Status: DC
  Administered 2018-02-21 – 2018-02-26 (×6): 3 mg via ORAL
  Filled 2018-02-21 (×6): qty 1

## 2018-02-21 MED ORDER — AMIODARONE HCL 200 MG PO TABS
200.0000 mg | ORAL_TABLET | Freq: Every day | ORAL | Status: DC
  Administered 2018-02-22 – 2018-02-27 (×6): 200 mg via ORAL
  Filled 2018-02-21 (×6): qty 1

## 2018-02-21 MED ORDER — NITROGLYCERIN 2 % TD OINT
1.0000 [in_us] | TOPICAL_OINTMENT | Freq: Three times a day (TID) | TRANSDERMAL | Status: AC
  Filled 2018-02-21: qty 30

## 2018-02-21 MED ORDER — SODIUM CHLORIDE 0.9 % IV BOLUS
1000.0000 mL | Freq: Once | INTRAVENOUS | Status: AC
  Administered 2018-02-21: 1000 mL via INTRAVENOUS

## 2018-02-21 MED ORDER — BISACODYL 5 MG PO TBEC: 5.0000 mg | DELAYED_RELEASE_TABLET | Freq: Every day | ORAL | Status: DC | PRN

## 2018-02-21 MED ORDER — ASPIRIN EC 81 MG PO TBEC
81.0000 mg | DELAYED_RELEASE_TABLET | Freq: Every day | ORAL | Status: DC
  Administered 2018-02-22 – 2018-02-27 (×6): 81 mg via ORAL
  Filled 2018-02-21 (×6): qty 1

## 2018-02-21 MED ORDER — SODIUM CHLORIDE 0.9 % IV BOLUS
500.0000 mL | Freq: Once | INTRAVENOUS | Status: AC
  Administered 2018-02-21: 500 mL via INTRAVENOUS

## 2018-02-21 MED ORDER — TRAMADOL HCL 50 MG PO TABS
100.0000 mg | ORAL_TABLET | Freq: Three times a day (TID) | ORAL | Status: DC | PRN
  Administered 2018-02-21 – 2018-02-26 (×8): 100 mg via ORAL
  Filled 2018-02-21 (×8): qty 2

## 2018-02-21 MED ORDER — ACETAMINOPHEN 325 MG PO TABS
650.0000 mg | ORAL_TABLET | Freq: Four times a day (QID) | ORAL | Status: DC | PRN
  Administered 2018-02-23 (×2): 650 mg via ORAL
  Filled 2018-02-21 (×2): qty 2

## 2018-02-21 MED ORDER — SODIUM CHLORIDE 0.9 % IV SOLN
2.0000 g | Freq: Once | INTRAVENOUS | Status: AC
  Administered 2018-02-21: 2 g via INTRAVENOUS
  Filled 2018-02-21: qty 2

## 2018-02-21 MED ORDER — ATORVASTATIN CALCIUM 40 MG PO TABS
40.0000 mg | ORAL_TABLET | Freq: Every day | ORAL | Status: DC
  Administered 2018-02-21 – 2018-02-26 (×6): 40 mg via ORAL
  Filled 2018-02-21 (×7): qty 1

## 2018-02-21 MED ORDER — FLUTICASONE PROPIONATE 50 MCG/ACT NA SUSP: 1.0000 | Freq: Every day | NASAL | Status: DC | PRN

## 2018-02-21 MED ORDER — WARFARIN SODIUM 5 MG PO TABS
5.0000 mg | ORAL_TABLET | Freq: Once | ORAL | Status: DC
  Filled 2018-02-21: qty 1

## 2018-02-21 MED ORDER — POLYETHYLENE GLYCOL 3350 17 GM/SCOOP PO POWD
17.0000 g | Freq: Every day | ORAL | Status: DC
  Administered 2018-02-21 – 2018-02-22 (×2): 17 g via ORAL
  Filled 2018-02-21 (×2): qty 255

## 2018-02-21 MED ORDER — PANTOPRAZOLE SODIUM 40 MG PO TBEC
40.0000 mg | DELAYED_RELEASE_TABLET | Freq: Every day | ORAL | Status: DC
  Administered 2018-02-22: 40 mg via ORAL
  Filled 2018-02-21: qty 1

## 2018-02-21 MED ORDER — LEVOTHYROXINE SODIUM 112 MCG PO TABS
112.0000 ug | ORAL_TABLET | Freq: Every day | ORAL | Status: DC
  Administered 2018-02-22 – 2018-02-27 (×6): 112 ug via ORAL
  Filled 2018-02-21 (×6): qty 1

## 2018-02-21 MED ORDER — LACTATED RINGERS IV SOLN
INTRAVENOUS | Status: DC
  Administered 2018-02-21 – 2018-02-22 (×2): via INTRAVENOUS

## 2018-02-21 NOTE — Progress Notes (Signed)
Right hand PIV removed d/t +2 edema in arm. IV flushes with no blood return. Arm is cool and dry to touch. Patient states that "it doesn't hurt." Arm elevated. Will continue to monitor.

## 2018-02-21 NOTE — ED Notes (Signed)
Attempted report 

## 2018-02-21 NOTE — Progress Notes (Signed)
ANTICOAGULATION CONSULT NOTE - Initial Consult  Pharmacy Consult for warfarin Indication: atrial fibrillation  No Known Allergies  Patient Measurements: Height: 6' (182.9 cm) Weight: (!) 315 lb 11.2 oz (143.2 kg) IBW/kg (Calculated) : 77.6  Vital Signs: Temp: 97.4 F (36.3 C) (11/17 1520) Temp Source: Oral (11/17 1520) BP: 121/57 (11/17 1800) Pulse Rate: 78 (11/17 1800)  Labs: Recent Labs    02/21/18 0317 02/21/18 1618  HGB 13.4  --   HCT 42.8  --   PLT 217  --   LABPROT 30.4*  --   INR 2.96  --   CREATININE 4.23* 4.68*    Estimated Creatinine Clearance: 20.9 mL/min (A) (by C-G formula based on SCr of 4.68 mg/dL (H)).  Assessment: CC/HPI: Fall x 2 with sepsis with acute renal failure - Hypotension with untreated UTI x 1 wk, Tachypnea, fever   PMH: CHF, Afib, anxiety, asthma, depression, edema, GERD, HTN, HLD, hypothyroid, neuropathy, morbid obeisty, PVD, CRI, OSA, CVA 2016, carotid stenosis, BPH, anemia, CAD, severe AS, USAP  Anticoag: warfarin PTA for Afib with h/o CVA Admit INR 2.96  - PTA Coumadin unknown for now - pt unsure of dose, but took 1.5 tabs (not what current med rec says) Last dose 11/15  Renal: Scr 1.06 (01/16/18)>>4.68  Heme/Onc: H&H 13.4/42.8, Plt 217 Per RN urine is bloody with clots  Goal of Therapy:  INR 2-3 Monitor platelets by anticoagulation protocol: Yes   Plan:  Hold warfarin tonight - f/u plan in am Daily INR  Jorge [name], PharmD, BCPS, BCCCP Clinical Pharmacist [phone]  Please check AMION for all [facility] numbers  02/21/2018 6:18 PM

## 2018-02-21 NOTE — H&P (Signed)
History and Physical    Jorge [name]:[id number] DOB: 1945/10/31 DOA: 02/21/2018  I have briefly reviewed the patient's prior medical records in [facility]  PCP: [name], PA-C  Patient coming from: home  Chief Complaint: Generalized weakness and falls  HPI: Jorge [name] is a 72 y.o. male with medical history significant of atrial fibrillation on anticoagulation, CKD 3, chronic diastolic CHF, hypertension, hyperlipidemia, peripheral vascular disease, OSA, hypothyroidism, who presents to the hospital with chief complaint of generalized weakness and 2 falls at home.  Patient reports that over the last several days he has been having increased dysuria as well as lower abdominal discomfort, was diagnosed with a UTI by PCP, was called in antibiotics however he has not taken them yet as they were mail-in.  He also has been complaining of lightheadedness and a headache.  He reports dyspnea, but denies any frank fevers, denies any chills.  He denies any chest pain, denies any palpitations.  ED Course: He was found to be septic in the emergency room with a fever of 100.5, tachypneic, tachycardic, potentially with initial blood pressure in the 70s that responded to fluids.  Source presumed urine based on urinalysis.  Blood work pertinent for an INR of 2.9, white count of 21.8, creatinine 4.2 from prior values around 1.3.  Initial lactic acid was 2.5 and improved to 1.8 after fluids.  He was cultured and started on broad-spectrum antibiotics and we are asked to admit  Review of Systems: As per HPI otherwise 10 point review of systems negative.   Past Medical History:  Diagnosis Date  . Anxiety    situational -   . Asthma due to seasonal allergies   . Atrial fibrillation (HCC)    pt on Eliquis  . Chest pain   . CHF (congestive heart failure) (HCC)   . Depression    situational  . Dyspnea    when I walk a long distance  . Dysrhythmia   . Edema   . GERD (gastroesophageal  reflux disease)    ocassional  . Heart murmur   . HTN (hypertension)   . Hyperlipidemia   . Hypothyroidism   . Neuropathy   . Obesity   . Peripheral vascular disease (HCC)    carotid   . Renal insufficiency 12/04/2017  . Sleep apnea   . SOB (shortness of breath)   . Stroke [facility])    Right side weakness, using a cane    Past Surgical History:  Procedure Laterality Date  . BACK SURGERY     LUmbar laminectomy  . CARDIAC CATHETERIZATION    . CARDIAC CATHETERIZATION N/A 04/30/2016   Procedure: Right/Left Heart Cath and Coronary Angiography;  Surgeon: Kathleene Hazel, MD;  Location: [facility] INVASIVE CV LAB;  Service: Cardiovascular;  Laterality: N/A;  . CAROTID ANGIOGRAM N/A 05/30/2014   Procedure: Dorise Bullion;  Surgeon: Nada Libman, MD;  Location: [facility] CATH LAB;  Service: Cardiovascular;  Laterality: N/A;  . EYE SURGERY Right    cataract  . EYE SURGERY Left    bioocular lens implanted due injury  . LUMBAR FUSION    . MULTIPLE EXTRACTIONS WITH ALVEOLOPLASTY N/A 05/02/2016   Procedure: Extraction of tooth #'s 7, 10, 23, 24, 25,and 26 with alveoloplasty.;  Surgeon: Charlynne Pander, DDS;  Location: [facility] OR;  Service: Oral Surgery;  Laterality: N/A;  . TEE WITHOUT CARDIOVERSION N/A 05/06/2016   Procedure: TRANSESOPHAGEAL ECHOCARDIOGRAM (TEE);  Surgeon: Kathleene Hazel, MD;  Location[facility] OR;  Service: Open Heart Surgery;  Laterality: N/A;  . TRANSCAROTID ARTERY REVASCULARIZATION (TCAR)  01/15/2018   TRANSCAROTID ARTERY REVASCULARIZATION LEFT   . TRANSCAROTID ARTERY REVASCULARIZATION Left 01/15/2018   Procedure: TRANSCAROTID ARTERY REVASCULARIZATION LEFT;  Surgeon: Sherren Kerns, MD;  Location: [facility] OR;  Service: Vascular;  Laterality: Left;  . TRANSCATHETER AORTIC VALVE REPLACEMENT, TRANSFEMORAL N/A 05/06/2016   Procedure: TRANSCATHETER AORTIC VALVE REPLACEMENT, TRANSFEMORAL using a 23mm Edwards Sapien 3 Transcatheter Heart Valve;  Surgeon: Kathleene Hazel, MD;   Location: [facility] OR;  Service: Open Heart Surgery;  Laterality: N/A;     reports that he has quit smoking. He has never used smokeless tobacco. He reports that he does not drink alcohol or use drugs.  No Known Allergies  Family History  Problem Relation Age of Onset  . Heart attack Mother   . Stroke Father     Prior to Admission medications   Medication Sig Start Date End Date Taking? Authorizing Provider  acetaminophen (TYLENOL) 325 MG tablet Take 2 tablets (650 mg total) by mouth every 4 (four) hours as needed for headache or mild pain. 12/05/17   Kathlen Mody, MD  amiodarone (PACERONE) 200 MG tablet Take 1 tablet (200 mg total) by mouth daily. Patient taking differently: Take 200 mg by mouth daily. (0800) 07/31/17   Wendall Stade, MD  aspirin EC 81 MG tablet Take 81 mg by mouth daily. (0800)    [provider]  atorvastatin (LIPITOR) 40 MG tablet Take 40 mg by mouth daily at 6 PM.     [provider]  bisacodyl (DULCOLAX) 5 MG EC tablet Take 5 mg by mouth daily as needed for moderate constipation.    [provider]  DULoxetine (CYMBALTA) 60 MG capsule Take 60 mg by mouth daily at 6 PM.  08/28/17   [provider]  enoxaparin (LOVENOX) 150 MG/ML injection Inject 150 mg into the skin every 12 (twelve) hours.    [provider]  fluticasone (FLONASE) 50 MCG/ACT nasal spray Place 1 spray into both nostrils daily as needed for allergies.  11/27/17   [provider]  furosemide (LASIX) 40 MG tablet Take 1 tablet (40 mg total) by mouth daily. Patient taking differently: Take 40 mg by mouth daily. (0800) 12/30/17   Danford, Earl Lites, MD  gabapentin (NEURONTIN) 800 MG tablet Take 800 mg by mouth at bedtime. (2100)    [provider]  HYDROcodone-acetaminophen (NORCO/VICODIN) 5-325 MG tablet Take 1-2 tablets by mouth every 6 (six) hours as needed for severe pain. 12/23/17   Terrilee Files, MD  levothyroxine (SYNTHROID, LEVOTHROID)  112 MCG tablet Take 112 mcg by mouth daily at 6 (six) AM.    [provider]  Melatonin 5 MG TABS Take 5 mg by mouth at bedtime. (2100)    [provider]  metoprolol tartrate (LOPRESSOR) 50 MG tablet Take 50 mg by mouth 2 (two) times daily. (0900 & 1700)    [provider]  nitroGLYCERIN (NITROSTAT) 0.4 MG SL tablet Place 1 tablet (0.4 mg total) under the tongue every 5 (five) minutes as needed for chest pain. 12/05/17   Kathlen Mody, MD  omeprazole (PRILOSEC) 40 MG capsule Take 40 mg by mouth daily as needed (indigestion).     [provider]  polyethylene glycol powder (GLYCOLAX/MIRALAX) powder Take 17 g by mouth daily. Patient taking differently: Take 17 g by mouth daily. (0800) 03/01/17   Geoffery Lyons, MD  traMADol (ULTRAM) 50 MG tablet Take 100 mg  by mouth 3 (three) times daily as needed for moderate pain.     [provider]  warfarin (COUMADIN) 3 MG tablet Take 9 mg by mouth every other day. (1700)    [provider]  warfarin (COUMADIN) 4 MG tablet Take 8 mg by mouth every other day. (1700)    [provider]  warfarin (COUMADIN) 5 MG tablet Take 1.5 tablets (7.5 mg total) by mouth daily. Take as directed by PCP 12/07/17   Kathlen Mody, MD    Physical Exam: Vitals:   02/21/18 0600 02/21/18 0615 02/21/18 0635 02/21/18 0704  BP: (!) 80/55 (!) 92/52 (!) 91/46 119/65  Pulse: 82 84 85 (!) 107  Resp: 20 (!) 21 (!) 21 (!) 28  Temp:      TempSrc:      SpO2: 96% 97% 100% 95%  Weight:      Height:       Constitutional: Actively having rigors on my evaluation Eyes: PERRL, lids and conjunctivae normal, no scleral icterus ENMT: Mucous membranes are dry Neck: normal, no masses Respiratory: Shallow breathing, no wheezing or crackles heard, increased respiratory effort and tachypnea noted Cardiovascular: Irregular, no murmurs appreciated, distant heart sounds.  Trace lower extremity edema. 2+ pedal pulses.  Abdomen: Obese, no  tenderness, no masses palpated. Bowel sounds positive.  Musculoskeletal: no clubbing / cyanosis. Normal muscle tone.  Skin: no rashes, lesions, ulcers. No induration Neurologic: CN 2-12 grossly intact. Strength 5/5 in all 4.  Psychiatric:  Alert and oriented x 3.  Mentating well  Labs on Admission: I have personally reviewed following labs and imaging studies  CBC: Recent Labs  Lab 02/21/18 0317  WBC 21.8*  NEUTROABS 18.8*  HGB 13.4  HCT 42.8  MCV 96.2  PLT 217   Basic Metabolic Panel: Recent Labs  Lab 02/21/18 0317  NA 131*  K 4.0  CL 93*  CO2 20*  GLUCOSE 173*  BUN 47*  CREATININE 4.23*  CALCIUM 8.0*   GFR: Estimated Creatinine Clearance: 23.2 mL/min (A) (by C-G formula based on SCr of 4.23 mg/dL (H)). Liver Function Tests: Recent Labs  Lab 02/21/18 0317  AST 31  ALT 27  ALKPHOS 132*  BILITOT 1.1  PROT 6.6  ALBUMIN 3.0*   No results for input(s): LIPASE, AMYLASE in the last 168 hours. No results for input(s): AMMONIA in the last 168 hours. Coagulation Profile: Recent Labs  Lab 02/21/18 0317  INR 2.96   Cardiac Enzymes: No results for input(s): CKTOTAL, CKMB, CKMBINDEX, TROPONINI in the last 168 hours. BNP (last 3 results) No results for input(s): PROBNP in the last 8760 hours. HbA1C: No results for input(s): HGBA1C in the last 72 hours. CBG: No results for input(s): GLUCAP in the last 168 hours. Lipid Profile: No results for input(s): CHOL, HDL, LDLCALC, TRIG, CHOLHDL, LDLDIRECT in the last 72 hours. Thyroid Function Tests: No results for input(s): TSH, T4TOTAL, FREET4, T3FREE, THYROIDAB in the last 72 hours. Anemia Panel: No results for input(s): VITAMINB12, FOLATE, FERRITIN, TIBC, IRON, RETICCTPCT in the last 72 hours. Urine analysis:    Component Value Date/Time   COLORURINE AMBER (A) 02/21/2018 0615   APPEARANCEUR CLOUDY (A) 02/21/2018 0615   LABSPEC 1.015 02/21/2018 0615   PHURINE 5.0 02/21/2018 0615   GLUCOSEU NEGATIVE 02/21/2018 0615     HGBUR LARGE (A) 02/21/2018 0615   BILIRUBINUR NEGATIVE 02/21/2018 0615   KETONESUR NEGATIVE 02/21/2018 0615   PROTEINUR 100 (A) 02/21/2018 0615   NITRITE NEGATIVE 02/21/2018 0615   LEUKOCYTESUR LARGE (  A) 02/21/2018 0615     Radiological Exams on Admission: Ct Head Wo Contrast  Result Date: 02/21/2018 CLINICAL DATA:  Status post fall out of bed, with concern for head injury. Initial encounter. EXAM: CT HEAD WITHOUT CONTRAST TECHNIQUE: Contiguous axial images were obtained from the base of the skull through the vertex without intravenous contrast. COMPARISON:  CT of the head performed 01/07/2018 FINDINGS: Brain: No evidence of acute infarction, hemorrhage, hydrocephalus, extra-axial collection or mass lesion / mass effect. Prominence of the ventricles and sulci reflects mild cortical volume loss. Mild subcortical white matter change likely reflects small vessel ischemic microangiopathy. The brainstem and fourth ventricle are within normal limits. The basal ganglia are unremarkable in appearance. The cerebral hemispheres demonstrate grossly normal gray-white differentiation. No mass effect or midline shift is seen. Vascular: No hyperdense vessel or unexpected calcification. Skull: There is no evidence of fracture; multiple large maxillary dental caries are noted. Sinuses/Orbits: Mild bilateral proptosis is noted. The orbits are otherwise unremarkable. There is mild partial opacification of the mastoid air cells bilaterally. The paranasal sinuses are well-aerated. Other: No significant soft tissue abnormalities are seen. IMPRESSION: 1. No evidence of traumatic intracranial injury or fracture. 2. Mild cortical volume loss and scattered small vessel ischemic microangiopathy. 3. Mild bilateral proptosis noted. 4. Multiple large maxillary dental caries noted. 5. Mild partial opacification of the mastoid air cells bilaterally. Electronically Signed   By: Roanna RaiderJeffery  Chang M.D.   On: 02/21/2018 05:09   Dg Chest  Port 1 View  Result Date: 02/21/2018 CLINICAL DATA:  Status post fall out of bed tonight. Recent urinary tract infection. Shortness of breath. EXAM: PORTABLE CHEST 1 VIEW COMPARISON:  Chest radiograph performed 12/24/2017 FINDINGS: The lungs are mildly hypoexpanded. Vascular congestion is noted. Increased interstitial markings may reflect transient interstitial edema. There is no evidence of pleural effusion or pneumothorax. The cardiomediastinal silhouette is mildly enlarged. No acute osseous abnormalities are seen. IMPRESSION: Lungs mildly hypoexpanded. Vascular congestion and mild cardiomegaly. Increased interstitial markings may reflect transient interstitial edema. No displaced rib fracture seen. Electronically Signed   By: Roanna RaiderJeffery  Chang M.D.   On: 02/21/2018 03:47   Ct Renal Stone Study  Result Date: 02/21/2018 CLINICAL DATA:  Status post fall out of bed, with concern for abdominal injury. Recent urinary tract infection. EXAM: CT ABDOMEN AND PELVIS WITHOUT CONTRAST TECHNIQUE: Multidetector CT imaging of the abdomen and pelvis was performed following the standard protocol without IV contrast. COMPARISON:  CT of the abdomen and pelvis performed 12/25/2017 FINDINGS: Lower chest: Mild bibasilar opacities likely reflect atelectasis. An aortic valve replacement is noted. Hepatobiliary: There is suggestion of small stones within the gallbladder. The liver is unremarkable in appearance. The common bile duct remains normal in caliber. Pancreas: The pancreas is within normal limits. Spleen: The spleen is unremarkable in appearance. Adrenals/Urinary Tract: The adrenal glands are unremarkable in appearance. Nonspecific perinephric stranding is noted bilaterally. Mild soft tissue inflammation tracks about the ureters, raising question for ureteritis. Soft tissue inflammation extends into the lower quadrants. Underlying pyelonephritis cannot be excluded. There is no evidence of hydronephrosis. No renal or ureteral  stones are identified. Stomach/Bowel: The stomach is unremarkable in appearance. The small bowel is within normal limits. The appendix is not visualized; there is no evidence for appendicitis. The colon is unremarkable in appearance. Vascular/Lymphatic: Scattered calcification is seen along the abdominal aorta and its branches. The abdominal aorta is otherwise grossly unremarkable. The inferior vena cava is grossly unremarkable. No retroperitoneal lymphadenopathy is seen. No pelvic sidewall  lymphadenopathy is identified. Reproductive: The bladder is decompressed and not well characterized. The prostate is borderline enlarged, measuring 4.8 cm in transverse dimension. Other: No additional soft tissue abnormalities are seen. Musculoskeletal: No acute osseous abnormalities are identified. The patient is status post interbody fusion at L5-S1. Vacuum phenomenon is noted along the lower lumbar spine. The visualized musculature is unremarkable in appearance. IMPRESSION: 1. Mild soft tissue inflammation tracks about the ureters, raising question for ureteritis. Soft tissue inflammation extends into the lower quadrants. Underlying pyelonephritis cannot be excluded. 2. Borderline enlarged prostate. 3. Mild bibasilar airspace opacities likely reflect atelectasis. 4. Suggestion of small stones within the gallbladder. Gallbladder otherwise grossly unremarkable. Aortic Atherosclerosis (ICD10-I70.0). Electronically Signed   By: Roanna Raider M.D.   On: 02/21/2018 05:14    EKG: Independently reviewed. A fib  Assessment/Plan Active Problems:   Aortic stenosis   CAD (coronary artery disease)   HLD (hyperlipidemia)   Essential hypertension   Paroxysmal atrial fibrillation (HCC)   Abdominal pain   History of cerebrovascular accident (CVA) with residual deficit   BPH (benign prostatic hyperplasia)   Obesity, Class III, BMI 40-49.9 (morbid obesity) (HCC)   Chronic intermittent hypoxia with obstructive sleep apnea   OSA  on CPAP   Acute on chronic diastolic congestive heart failure (HCC)   Anticoagulated on warfarin   Severe sepsis (HCC)   AKI (acute kidney injury) (HCC)   CKD (chronic kidney disease) stage 3, GFR 30-59 ml/min (HCC)    Severe sepsis due to urinary tract infection /pyelonephritis -Admit patient to stepdown, will bolus to complete 4 L fluid resuscitation and then continue on maintenance fluids, blood pressure seems to be responding to fluids, if hypotension persists may need ICU level of care -Placed on broad-spectrum antibiotics with vancomycin and cefepime -CT scan on admission with concern for pyelonephritis and ureteritis  Acute kidney injury on chronic kidney disease stage III -Likely in the setting of hypotension and sepsis, IV fluids as above and monitor renal function.  Baseline around 1.3  Chronic diastolic CHF -most recent 2D echo was done in August 2019 showed normal EF and grade 1 diastolic dysfunction. -Chest x-ray on admission showed slightly increased vascular congestion cardiomegaly, monitor respiratory status closely as he is fluid resuscitated for his severe sepsis  A. fib -Anticoagulated with Coumadin, continue amiodarone now, INR on admission is 2.9 and there were reported of hematuria, hold Coumadin for now, if hematuria improves will resume it tomorrow  Coronary artery disease -No chest pain, continue aspirin and Lipitor  Hypothyroidism -Continue Synthroid  Hypertension -Hold home antihypertensives as he is hypotensive currently, resume as appropriate    DVT prophylaxis: On Coumadin Code Status: Full code Family Communication: No family present at bedside Disposition Plan: Admit to stepdown Consults called: None   Pamella Pert, MD, PhD [facility] Pager [phone]  If 7PM-7AM, please contact night-coverage [email or web address] Password [email or web address]  02/21/2018, 7:15 AM

## 2018-02-21 NOTE — ED Provider Notes (Signed)
Medical screening examination/treatment/procedure(s) were conducted as a shared visit with non-physician practitioner(s) and myself.  I personally evaluated the patient during the encounter.  Soft pressures with untreated UTI for a week.  Also with tachypnea and fever.  Code sepsis initiated.  Suspect likely urinary source likely pyelonephritis but with the tachypnea is less clear so we will cover broadly.  Patient has history of CHF and does not have any evidence of optic shock at this time he does respond well to IV fluids we will continue with that.   Marily MemosMesner, Moneka Mcquinn, MD 02/21/18 [phone]
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
## 2018-02-21 NOTE — ED Notes (Signed)
In and out cath performed with assistance by Clydie BraunKaren. Noticed blood in urine when removing cath.

## 2018-02-21 NOTE — Progress Notes (Signed)
PHARMACY - PHYSICIAN COMMUNICATION CRITICAL VALUE ALERT - BLOOD CULTURE IDENTIFICATION (BCID)  Jorge [name] is an 72 y.o. male who [facility] with a chief complaint of Urosepsis  Assessment:  Blood cx + for [facility]  Name of physician (or Provider) Contacted: Dr Kendrick FriesMcQuaid  Current antibiotics: Cefepime Vanc  Changes to prescribed antibiotics recommended:  DC Cefepime Vanc Ceftriaxone 2 g q24h  Results for orders placed or performed during the hospital encounter of 02/21/18  Blood Culture ID Panel (Reflexed) (Collected: 02/21/2018  3:06 AM)  Result Value Ref Range   Enterococcus species NOT DETECTED NOT DETECTED   Listeria monocytogenes NOT DETECTED NOT DETECTED   Staphylococcus species NOT DETECTED NOT DETECTED   Staphylococcus aureus (BCID) NOT DETECTED NOT DETECTED   Streptococcus species NOT DETECTED NOT DETECTED   Streptococcus agalactiae NOT DETECTED NOT DETECTED   Streptococcus pneumoniae NOT DETECTED NOT DETECTED   Streptococcus pyogenes NOT DETECTED NOT DETECTED   Acinetobacter baumannii NOT DETECTED NOT DETECTED   Enterobacteriaceae species DETECTED (A) NOT DETECTED   Enterobacter cloacae complex NOT DETECTED NOT DETECTED   Escherichia coli DETECTED (A) NOT DETECTED   Klebsiella oxytoca NOT DETECTED NOT DETECTED   Klebsiella pneumoniae NOT DETECTED NOT DETECTED   Proteus species NOT DETECTED NOT DETECTED   Serratia marcescens NOT DETECTED NOT DETECTED   Carbapenem resistance NOT DETECTED NOT DETECTED   Haemophilus influenzae NOT DETECTED NOT DETECTED   Neisseria meningitidis NOT DETECTED NOT DETECTED   Pseudomonas aeruginosa NOT DETECTED NOT DETECTED   Candida albicans NOT DETECTED NOT DETECTED   Candida glabrata NOT DETECTED NOT DETECTED   Candida krusei NOT DETECTED NOT DETECTED   Candida parapsilosis NOT DETECTED NOT DETECTED   Candida tropicalis NOT DETECTED NOT DETECTED   Isaac BlissMichael Knoxx Boeding, PharmD, BCPS, BCCCP Clinical  Pharmacist [phone]  Please check AMION for all [facility] numbers  02/21/2018 6:22 PM

## 2018-02-21 NOTE — Progress Notes (Signed)
Pharmacy Antibiotic Note  Marygrace DroughtGeorge O Cuppett is a 72 y.o. male admitted on 02/21/2018 with sepsis.  Pharmacy has been consulted for vancomycin and cefepime dosing. Vnacomycin 2500 mg and cefepime 2gm ordered in the ED  Plan: Continue cefepime 1gm IV q24 hours Cont vancomycin 2gm IV q48 hours F/u renal function, cultures and clinical course  Height: 6' (182.9 cm) Weight: (!) 315 lb 11.2 oz (143.2 kg) IBW/kg (Calculated) : 77.6  Temp (24hrs), Avg:99.8 F (37.7 C), Min:98.7 F (37.1 C), Max:100.9 F (38.3 C)  Recent Labs  Lab 02/21/18 0317 02/21/18 0318 02/21/18 0531  WBC 21.8*  --   --   CREATININE 4.23*  --   --   LATICACIDVEN  --  2.53* 1.81    Estimated Creatinine Clearance: 23.2 mL/min (A) (by C-G formula based on SCr of 4.23 mg/dL (H)).    No Known Allergies  Thank you for allowing pharmacy to be a part of this patient's care.  Talbert CageSeay, Alvin Diffee Poteet 02/21/2018 6:54 AM

## 2018-02-21 NOTE — ED Triage Notes (Signed)
Per EMS pt has had UTI for about a week. Has not taken any antibiotics thus far.  Has a new onset of immobility. EMS has been to his home twice tonight. Larey SeatFell out of bed tonight. Asleep when this happened and unable to get up. EMS retrieved him from the floor.

## 2018-02-21 NOTE — Progress Notes (Deleted)
ANTICOAGULATION CONSULT NOTE - Initial Consult  Pharmacy Consult for warfarin Indication: atrial fibrillation  No Known Allergies  Patient Measurements: Height: 6' (182.9 cm) Weight: (!) 315 lb 11.2 oz (143.2 kg) IBW/kg (Calculated) : 77.6  Vital Signs: Temp: 97.4 F (36.3 C) (11/17 1520) Temp Source: Oral (11/17 1520) BP: 110/65 (11/17 1700) Pulse Rate: 76 (11/17 1700)  Labs: Recent Labs    02/21/18 0317 02/21/18 1618  HGB 13.4  --   HCT 42.8  --   PLT 217  --   LABPROT 30.4*  --   INR 2.96  --   CREATININE 4.23* 4.68*    Estimated Creatinine Clearance: 20.9 mL/min (A) (by C-G formula based on SCr of 4.68 mg/dL (H)).  Assessment: CC/HPI: Fall x 2 with sepsis with acute renal failure - Hypotension with untreated UTI x 1 wk, Tachypnea, fever   PMH: CHF, Afib, anxiety, asthma, depression, edema, GERD, HTN, HLD, hypothyroid, neuropathy, morbid obeisty, PVD, CRI, OSA, CVA 2016, carotid stenosis, BPH, anemia, CAD, severe AS, USAP  Anticoag: warfarin PTA for Afib with h/o CVA Admit INR 2.96  - PTA Coumadin unknown for now - pt unsure of dose, but took 1.5 tabs (not what current med rec says) Last dose 11/15  Renal: Scr 1.06 (01/16/18)>>4.68  Heme/Onc: H&H 13.4/42.8, Plt 217  Goal of Therapy:  INR 2-3 Monitor platelets by anticoagulation protocol: Yes   Plan:  Warfarin 5 mg x 1 Daily INR  Jorge [name], PharmD, BCPS, BCCCP Clinical Pharmacist [phone]  Please check AMION for all [facility] Pharm[facility] numbers  02/21/2018 6:14 PM

## 2018-02-21 NOTE — Progress Notes (Signed)
ABG ordered for patient.  Results obtained on 2L nasal cannula.  Patient tolerated well.     Ref. Range 02/21/2018 12:16  Sample type Unknown ARTERIAL  pH, Arterial Latest Ref Range: 7.350 - 7.450  7.358  pCO2 arterial Latest Ref Range: 32.0 - 48.0 mmHg 32.7  pO2, Arterial Latest Ref Range: 83.0 - 108.0 mmHg 84.0  TCO2 Latest Ref Range: 22 - 32 mmol/L 19 (L)  Acid-base deficit Latest Ref Range: 0.0 - 2.0 mmol/L 6.0 (H)  Bicarbonate Latest Ref Range: 20.0 - 28.0 mmol/L 18.5 (L)  O2 Saturation Latest Units: % 96.0  Patient temperature Unknown 98.0 F  Collection site Unknown RADIAL, ALLEN'S TEST ACCEPTABLE

## 2018-02-21 NOTE — Consult Note (Signed)
NAME:  Jorge [name], MRN:  [id number], DOB:  Apr 19, 1945, LOS: 0 ADMISSION DATE:  02/21/2018, CONSULTATION DATE:  02/21/2018 REFERRING MD:  Elvera LennoxGherghe, CHIEF COMPLAINT:  Hypotension   Brief History   72 year old male admitted with septic shock from a UTI, pyelonephritis.  History of present illness   72 year old male with a past medical history significant for peripheral vascular disease was admitted on February 21, 2018 with complaints of falls at home and generalized weakness.  He had recently been diagnosed with a urinary tract infection by his primary care physician but was unable to take antibiotics because they had been sent to a mail-in pharmacy.  He presented to our facility with complaints of generalized weakness.  He was found to be hypotensive, have a fever, and evidence on CT scan of his abdomen of stranding around his ureters and likely pyelonephritis.  He was admitted by the general medicine service to stepdown status and given 5 L of normal saline but despite this he remained encephalopathic and had evidence of shock so pulmonary critical care medicine was consulted for further management.  He was unable to provide history because of encephalopathy on my evaluation.  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 fib, obstructive sleep apnea, GERD, hypertension, diastolic heart failure  Significant Hospital Events   11/17 admitted, hypotensive, transfer to ICU  Consults:  PCCM 11/17  Procedures:    Significant Diagnostic Tests:  February 21, 2018 CT renal stone study showed mild tissue inflammation along ureters with extension into the lower quadrants, underlying pyelogram be excluded, enlarged prostate, bibasilar airspace disease due to atelectasis, gallstones but normal-appearing gallbladder otherwise February 21, 2018 CT head mild cortical volume loss, no acute  intracranial abnormality, mastoid cell opacification  Micro Data:  11/17 blood >  11/17 urine >   Antimicrobials:   11/17 cefepime>  11/17 vanc>   Interim history/subjective:  As above  Objective   Blood pressure (!) 85/43, pulse 81, temperature 98 F (36.7 C), temperature source Oral, resp. rate (!) 24, height 6' (1.829 m), weight (!) 143.2 kg, SpO2 98 %.        Intake/Output Summary (Last 24 hours) at 02/21/2018 1116 Last data filed at 02/21/2018 0617 Gross per 24 hour  Intake -  Output 230 ml  Net -230 ml   Filed Weights   02/21/18 0330  Weight: (!) 143.2 kg    Examination:  General:  Sleepy in bed, arouses HENT: NCAT OP clear PULM: Crackles bases B, normal effort CV: RRR, no mgr, radial pulses very faint GI: BS+, soft, nontender Derm: poor cap refill in extremities Neuro: drowsy, opens eyes to sternal rub, moans   Resolved Hospital Problem list     Assessment & Plan:  72 year old male with multiple medical problems admitted with septic shock due to an acute urinary tract infection, likely pyelonephritis.  I worry that his blood pressure readings do not be accurate as his lactic acid has cleared but signs on physical exam are worrisome for shock with decreased cap refill and encephalopathy.  He also has low urine output.  Septic shock: Continue crystalloid infusion as ordered Start levo fed to maintain blood pressure greater than 65 mean arterial pressure Continue cefepime Continue vancomycin Follow-up blood cultures Follow-up urine cultures Low threshold to place arterial line and central line, will evaluate after transfer to ICU Place second IV now  Atrial fibrillation, on warfarin: Monitor for  bleeding Warfarin per pharmacy  Congestive heart failure: Hold home blood pressure medicines Hold home diuretics Telemetry monitoring ICU hemodynamic monitoring  Acute toxic metabolic encephalopathy due to sepsis: Continue resuscitation and antibiotics  as above Close monitoring in the intensive care unit ABG to check for hypercarbia  Acute kidney injury: No evidence of obstruction on CT scan Foley in place Monitor BMET and UOP Replace electrolytes as needed Continue volume resuscitation   Best practice:  Diet: NPO Pain/Anxiety/Delirium protocol (if indicated): n/a VAP protocol (if indicated): nn/a DVT prophylaxis: warfarin per pharm GI prophylaxis: n/a Glucose control: SSI Mobility: advance as able Code Status: full Family Communication: none bedside Disposition: to ICU  Labs   CBC: Recent Labs  Lab 02/21/18 0317  WBC 21.8*  NEUTROABS 18.8*  HGB 13.4  HCT 42.8  MCV 96.2  PLT 217    Basic Metabolic Panel: Recent Labs  Lab 02/21/18 0317  NA 131*  K 4.0  CL 93*  CO2 20*  GLUCOSE 173*  BUN 47*  CREATININE 4.23*  CALCIUM 8.0*   GFR: Estimated Creatinine Clearance: 23.2 mL/min (A) (by C-G formula based on SCr of 4.23 mg/dL (H)). Recent Labs  Lab 02/21/18 0317 02/21/18 0318 02/21/18 0531  WBC 21.8*  --   --   LATICACIDVEN  --  2.53* 1.81    Liver Function Tests: Recent Labs  Lab 02/21/18 0317  AST 31  ALT 27  ALKPHOS 132*  BILITOT 1.1  PROT 6.6  ALBUMIN 3.0*   No results for input(s): LIPASE, AMYLASE in the last 168 hours. No results for input(s): AMMONIA in the last 168 hours.  ABG    Component Value Date/Time   PHART 7.364 05/06/2016 1454   PCO2ART 53.7 (H) 05/06/2016 1454   PO2ART 126.0 (H) 05/06/2016 1454   HCO3 30.8 (H) 05/06/2016 1454   TCO2 30 12/12/2017 1346   O2SAT 99.0 05/06/2016 1454     Coagulation Profile: Recent Labs  Lab 02/21/18 0317  INR 2.96    Cardiac Enzymes: No results for input(s): CKTOTAL, CKMB, CKMBINDEX, TROPONINI in the last 168 hours.  HbA1C: Hgb A1c MFr Bld  Date/Time Value Ref Range Status  12/12/2017 05:25 PM 6.5 (H) 4.8 - 5.6 % Final    Comment:    (NOTE) Pre diabetes:          5.7%-6.4% Diabetes:              >6.4% Glycemic control for    <7.0% adults with diabetes   05/25/2014 08:44 AM 5.7 (H) 4.8 - 5.6 % Final    Comment:    (NOTE)         Pre-diabetes: 5.7 - 6.4         Diabetes: >6.4         Glycemic control for adults with diabetes: <7.0     CBG: No results for input(s): GLUCAP in the last 168 hours.  Review of Systems:   Cannot obtain due to confusion  Past Medical History  He,  has a past medical history of Anxiety, Asthma due to seasonal allergies, Atrial fibrillation (HCC), Chest pain, CHF (congestive heart failure) (HCC), Depression, Dyspnea, Dysrhythmia, Edema, GERD (gastroesophageal reflux disease), Heart murmur, HTN (hypertension), Hyperlipidemia, Hypothyroidism, Neuropathy, Obesity, Peripheral vascular disease (HCC), Renal insufficiency (12/04/2017), Sleep apnea, SOB (shortness of breath), and Stroke (HCC).   Surgical History    Past Surgical History:  Procedure Laterality Date  . BACK SURGERY     LUmbar laminectomy  . CARDIAC CATHETERIZATION    .  CARDIAC CATHETERIZATION N/A 04/30/2016   Procedure: Right/Left Heart Cath and Coronary Angiography;  Surgeon: Kathleene Hazel, MD;  Location: [facility] INVASIVE CV LAB;  Service: Cardiovascular;  Laterality: N/A;  . CAROTID ANGIOGRAM N/A 05/30/2014   Procedure: Dorise Bullion;  Surgeon: Nada Libman, MD;  Location: [facility] CATH LAB;  Service: Cardiovascular;  Laterality: N/A;  . EYE SURGERY Right    cataract  . EYE SURGERY Left    bioocular lens implanted due injury  . LUMBAR FUSION    . MULTIPLE EXTRACTIONS WITH ALVEOLOPLASTY N/A 05/02/2016   Procedure: Extraction of tooth #'s 7, 10, 23, 24, 25,and 26 with alveoloplasty.;  Surgeon: Charlynne Pander, DDS;  Location: [facility] OR;  Service: Oral Surgery;  Laterality: N/A;  . TEE WITHOUT CARDIOVERSION N/A 05/06/2016   Procedure: TRANSESOPHAGEAL ECHOCARDIOGRAM (TEE);  Surgeon: Kathleene Hazel, MD;  Location: [facility] OR;  Service: Open Heart Surgery;  Laterality: N/A;  . TRANSCAROTID ARTERY REVASCULARIZATION  (TCAR)  01/15/2018   TRANSCAROTID ARTERY REVASCULARIZATION LEFT   . TRANSCAROTID ARTERY REVASCULARIZATION Left 01/15/2018   Procedure: TRANSCAROTID ARTERY REVASCULARIZATION LEFT;  Surgeon: Sherren Kerns, MD;  Location: [facility] OR;  Service: Vascular;  Laterality: Left;  . TRANSCATHETER AORTIC VALVE REPLACEMENT, TRANSFEMORAL N/A 05/06/2016   Procedure: TRANSCATHETER AORTIC VALVE REPLACEMENT, TRANSFEMORAL using a 23mm Edwards Sapien 3 Transcatheter Heart Valve;  Surgeon: Kathleene Hazel, MD;  Location: [facility] OR;  Service: Open Heart Surgery;  Laterality: N/A;     Social History   reports that he has quit smoking. He has never used smokeless tobacco. He reports that he does not drink alcohol or use drugs.   Family History   His family history includes Heart attack in his mother; Stroke in his father.   Allergies No Known Allergies   Home Medications  Prior to Admission medications   Medication Sig Start Date End Date Taking? Authorizing Provider  acetaminophen (TYLENOL) 325 MG tablet Take 2 tablets (650 mg total) by mouth every 4 (four) hours as needed for headache or mild pain. 12/05/17   Kathlen Mody, MD  amiodarone (PACERONE) 200 MG tablet Take 1 tablet (200 mg total) by mouth daily. Patient taking differently: Take 200 mg by mouth daily. (0800) 07/31/17   Wendall Stade, MD  aspirin EC 81 MG tablet Take 81 mg by mouth daily. (0800)    [provider]  atorvastatin (LIPITOR) 40 MG tablet Take 40 mg by mouth daily at 6 PM.     [provider]  bisacodyl (DULCOLAX) 5 MG EC tablet Take 5 mg by mouth daily as needed for moderate constipation.    [provider]  DULoxetine (CYMBALTA) 60 MG capsule Take 60 mg by mouth daily at 6 PM.  08/28/17   [provider]  enoxaparin (LOVENOX) 150 MG/ML injection Inject 150 mg into the skin every 12 (twelve) hours.    [provider]  fluticasone (FLONASE) 50 MCG/ACT nasal spray Place 1 spray into both  nostrils daily as needed for allergies.  11/27/17   [provider]  furosemide (LASIX) 40 MG tablet Take 1 tablet (40 mg total) by mouth daily. Patient taking differently: Take 40 mg by mouth daily. (0800) 12/30/17   Danford, Earl Lites, MD  gabapentin (NEURONTIN) 800 MG tablet Take 800 mg by mouth at bedtime. (2100)    [provider]  HYDROcodone-acetaminophen (NORCO/VICODIN) 5-325 MG tablet Take 1-2 tablets by mouth every 6 (six) hours as needed for severe pain. 12/23/17   Meridee Score  C, MD  levothyroxine (SYNTHROID, LEVOTHROID) 112 MCG tablet Take 112 mcg by mouth daily at 6 (six) AM.    [provider]  Melatonin 5 MG TABS Take 5 mg by mouth at bedtime. (2100)    [provider]  metoprolol tartrate (LOPRESSOR) 50 MG tablet Take 50 mg by mouth 2 (two) times daily. (0900 & 1700)    [provider]  nitroGLYCERIN (NITROSTAT) 0.4 MG SL tablet Place 1 tablet (0.4 mg total) under the tongue every 5 (five) minutes as needed for chest pain. 12/05/17   Kathlen Mody, MD  omeprazole (PRILOSEC) 40 MG capsule Take 40 mg by mouth daily as needed (indigestion).     [provider]  polyethylene glycol powder (GLYCOLAX/MIRALAX) powder Take 17 g by mouth daily. Patient taking differently: Take 17 g by mouth daily. (0800) 03/01/17   Geoffery Lyons, MD  traMADol (ULTRAM) 50 MG tablet Take 100 mg by mouth 3 (three) times daily as needed for moderate pain.     [provider]  warfarin (COUMADIN) 3 MG tablet Take 9 mg by mouth every other day. (1700)    [provider]  warfarin (COUMADIN) 4 MG tablet Take 8 mg by mouth every other day. (1700)    [provider]  warfarin (COUMADIN) 5 MG tablet Take 1.5 tablets (7.5 mg total) by mouth daily. Take as directed by PCP 12/07/17   Kathlen Mody, MD     Critical care time: 45 minutes    Heber Plainview, MD [place] [facility] Pager: [phone] Cell: [phone] If no response, call  [phone]

## 2018-02-22 ENCOUNTER — Inpatient Hospital Stay (HOSPITAL_COMMUNITY): Payer: Medicare Other

## 2018-02-22 LAB — COMPREHENSIVE METABOLIC PANEL
ALBUMIN: 2.1 g/dL — AB (ref 3.5–5.0)
ALK PHOS: 98 U/L (ref 38–126)
ALT: 25 U/L (ref 0–44)
ANION GAP: 11 (ref 5–15)
AST: 31 U/L (ref 15–41)
BUN: 58 mg/dL — AB (ref 8–23)
CO2: 20 mmol/L — AB (ref 22–32)
Calcium: 6.7 mg/dL — ABNORMAL LOW (ref 8.9–10.3)
Chloride: 100 mmol/L (ref 98–111)
Creatinine, Ser: 5.03 mg/dL — ABNORMAL HIGH (ref 0.61–1.24)
GFR calc Af Amer: 12 mL/min — ABNORMAL LOW (ref >60)
GFR calc non Af Amer: 10 mL/min — ABNORMAL LOW (ref >60)
GLUCOSE: 157 mg/dL — AB (ref 70–99)
Potassium: 4 mmol/L (ref 3.5–5.1)
Sodium: 131 mmol/L — ABNORMAL LOW (ref 135–145)
Total Bilirubin: 0.8 mg/dL (ref 0.3–1.2)
Total Protein: 5.6 g/dL — ABNORMAL LOW (ref 6.5–8.1)

## 2018-02-22 LAB — PROTIME-INR
INR: 5.03
INR: 5.78
Prothrombin Time: 45.8 s — ABNORMAL HIGH (ref 11.4–15.2)
Prothrombin Time: 51 s — ABNORMAL HIGH (ref 11.4–15.2)

## 2018-02-22 LAB — CBC
HCT: 34.8 % — ABNORMAL LOW (ref 39.0–52.0)
Hemoglobin: 10.9 g/dL — ABNORMAL LOW (ref 13.0–17.0)
MCH: 29.8 pg (ref 26.0–34.0)
MCHC: 31.3 g/dL (ref 30.0–36.0)
MCV: 95.1 fL (ref 80.0–100.0)
PLATELETS: 152 10*3/uL (ref 150–400)
RBC: 3.66 MIL/uL — AB (ref 4.22–5.81)
RDW: 14.2 % (ref 11.5–15.5)
WBC: 19.4 10*3/uL — ABNORMAL HIGH (ref 4.0–10.5)
nRBC: 0 % (ref 0.0–0.2)

## 2018-02-22 LAB — TYPE AND SCREEN
ABO/RH(D): O POS
Antibody Screen: NEGATIVE

## 2018-02-22 LAB — PREPARE FRESH FROZEN PLASMA
Unit division: 0
Unit division: 0

## 2018-02-22 LAB — BPAM FFP
Blood Product Expiration Date: 201911202359
Blood Product Expiration Date: 201911202359
Blood Product Expiration Date: 201911202359
ISSUE DATE / TIME: 201911181147
ISSUE DATE / TIME: 201911181147
Unit Type and Rh: 5100
Unit Type and Rh: 5100
Unit Type and Rh: 9500

## 2018-02-22 MED ORDER — PANTOPRAZOLE SODIUM 40 MG PO TBEC
40.0000 mg | DELAYED_RELEASE_TABLET | Freq: Every day | ORAL | Status: DC
  Administered 2018-02-22 – 2018-02-26 (×5): 40 mg via ORAL
  Filled 2018-02-22 (×5): qty 1

## 2018-02-22 MED ORDER — TAMSULOSIN HCL 0.4 MG PO CAPS
0.4000 mg | ORAL_CAPSULE | Freq: Every day | ORAL | Status: DC
  Administered 2018-02-22 – 2018-02-26 (×5): 0.4 mg via ORAL
  Filled 2018-02-22 (×6): qty 1

## 2018-02-22 MED ORDER — ALBUTEROL SULFATE (2.5 MG/3ML) 0.083% IN NEBU
2.5000 mg | INHALATION_SOLUTION | RESPIRATORY_TRACT | Status: DC | PRN
  Administered 2018-02-22 (×2): 2.5 mg via RESPIRATORY_TRACT
  Filled 2018-02-22 (×2): qty 3

## 2018-02-22 MED ORDER — SODIUM CHLORIDE 0.9% IV SOLUTION: Freq: Once | INTRAVENOUS | Status: DC

## 2018-02-22 MED ORDER — SODIUM CHLORIDE 0.9 % IV SOLN
2.0000 g | INTRAVENOUS | Status: DC
  Administered 2018-02-23: 2 g via INTRAVENOUS
  Filled 2018-02-22: qty 20

## 2018-02-22 MED ORDER — FUROSEMIDE 10 MG/ML IJ SOLN: 40.0000 mg | Freq: Once | INTRAMUSCULAR | Status: DC

## 2018-02-22 MED ORDER — GUAIFENESIN ER 600 MG PO TB12
1200.0000 mg | ORAL_TABLET | Freq: Two times a day (BID) | ORAL | Status: DC
  Administered 2018-02-22 – 2018-02-27 (×11): 1200 mg via ORAL
  Filled 2018-02-22 (×11): qty 2

## 2018-02-22 MED ORDER — PHYTONADIONE 5 MG PO TABS
5.0000 mg | ORAL_TABLET | Freq: Once | ORAL | Status: AC
  Administered 2018-02-22: 5 mg via ORAL
  Filled 2018-02-22: qty 1

## 2018-02-22 MED ORDER — BENZONATATE 100 MG PO CAPS
100.0000 mg | ORAL_CAPSULE | Freq: Three times a day (TID) | ORAL | Status: DC
  Administered 2018-02-22 – 2018-02-27 (×15): 100 mg via ORAL
  Filled 2018-02-22 (×16): qty 1

## 2018-02-22 NOTE — Progress Notes (Addendum)
ANTICOAGULATION CONSULT NOTE - Follow Up Consult  Pharmacy Consult for warfarin Indication: atrial fibrillation  No Known Allergies  Patient Measurements: Height: 6' (182.9 cm) Weight: (!) 315 lb 11.2 oz (143.2 kg) IBW/kg (Calculated) : 77.6  Vital Signs: Temp: 97.6 F (36.4 C) (11/18 0708) Temp Source: Oral (11/18 0708) BP: 130/87 (11/18 0800) Pulse Rate: 87 (11/18 0800)  Labs: Recent Labs    02/21/18 0317 02/21/18 1618 02/22/18 0403  HGB 13.4  --  10.9*  HCT 42.8  --  34.8*  PLT 217  --  152  LABPROT 30.4*  --  45.8*  INR 2.96  --  5.03*  CREATININE 4.23* 4.68* 5.03*    Estimated Creatinine Clearance: 19.5 mL/min (A) (by C-G formula based on SCr of 5.03 mg/dL (H)).   Medications:  Scheduled:  . sodium chloride   Intravenous Once  . amiodarone  200 mg Oral Daily  . aspirin EC  81 mg Oral Daily  . atorvastatin  40 mg Oral q1800  . DULoxetine  60 mg Oral q1800  . levothyroxine  112 mcg Oral Q0600  . Melatonin  3 mg Oral QHS  . nitroGLYCERIN  1 inch Topical Q8H  . pantoprazole  40 mg Oral Daily  . polyethylene glycol powder  17 g Oral Daily  . Warfarin - Pharmacist Dosing Inpatient   Does not apply q1800    Assessment: Patient admitted 11/17 from ED with chief complaint of Fall x 2 with sepsis with acute renal failure, 2/2 untreated UTI x 1 week. Patient takes warfarin for Afib with h/o CVA Admit INR 2.96  - PTA Coumadin unknown for now - pt unsure of dose, but took 1.5 tabs (not what current med rec says) Last dose 11/15  Heme/Onc: H&H 13.4/42.8, Plt 217 Per RN urine is bloody with clots  Patient currently supratherapeutic, with current INR 5.03, 2/2 illness.  Goal of Therapy:  INR 2-3 Monitor platelets by anticoagulation protocol: Yes   Plan:  Continue to hold warfarin and monitor INR daily. F/U 11/19  Jorge  [name] 02/22/2018,8:25 AM    Addenum -I agree with the assessment and plan.  -Hold warfarin today, re-check INR tomorrow  morning   Jorge [name], Jorge [name] M 02/22/2018 8:38 AM

## 2018-02-22 NOTE — Progress Notes (Signed)
eLink Physician-Brief Progress Note Patient Name: Jorge [name] DOB: 1945/05/15 MRN: [id number]   Date of Service  02/22/2018  HPI/Events of Note  Wheezing - Sat = 96% and  RR = 18 on [place] O2.   eICU Interventions  Will order: 1. Albuterol 2.5 mg via neb Q 3 hours PRN wheezing or SOB.     Intervention Category Major Interventions: Other:  Lenell AntuSommer,Shahad Mazurek Eugene 02/22/2018, 4:34 AM

## 2018-02-22 NOTE — Progress Notes (Signed)
[facility] Progress Note  Patient: Jorge [name] ZOX:[id number]   PCP: [name], PA-C DOB: [birth year]   DOA: 02/21/2018   DOS: 02/22/2018   Date of Service: the patient was seen and examined on 02/22/2018  Brief hospital course: Pt. with PMH of TAVR, PVD, chronic kidney disease stage III, paroxysmal A. fib, OSA, GERD, HTN, chronic diastolic CHF; admitted on 02/21/2018, presented with complaint of cough and shortness of breath, was found to have E. coli bacteremia.  Briefly was in the ICU for septic shock and received IV fluids Currently further plan is continue IV hydration and IV antibiotics.  Subjective: Feeling better.  Complains about cough.  No nausea no vomiting no fever no chills.  No chest pain abdominal pain.  No diarrhea as well.  Has not seen a urologist but reports that he has difficulty urinating for long time.  Assessment and Plan: 1.  Septic shock. Acute pyelonephritis. E. coli bacteremia CT renal protocol does not show any evidence of obstruction or stone. Patient does have mildly enlarged bladder suspect that he has bladder outlet obstruction which is leading to infection. We will start the patient on Flomax. Continue IV antibiotics, change to ceftriaxone. E. coli bacteremia will not require any repeat culture clearance. Continue to monitor in the stepdown unit.  2. Acute kidney injury on chronic kidney disease stage III -Likely in the setting of hypotension and sepsis, IV fluids as above and monitor renal function.  Baseline around 1.3  Chronic diastolic CHF -most recent 2D echo was done in August 2019 showed normal EF and grade 1 diastolic dysfunction. -Chest x-ray on admission showed slightly increased vascular congestion cardiomegaly, monitor respiratory status closely as he is fluid resuscitated for his severe sepsis  Paroxysmal A. fib Supratherapeutic INR -Anticoagulated with Coumadin, continue amiodarone now, INR on admission is 2.9 and there  were reported of hematuria, hold Coumadin for now,  Coronary artery disease -No chest pain, continue aspirin and Lipitor  Hypothyroidism -Continue Synthroid  Hypertension -Hold home antihypertensives as he is hypotensive currently, resume as appropriate  Anemia dilutional. No acute bleeding. Drop in hemoglobin is mostly secondary to dilution. We will monitor.     Component Value Date/Time   HGB 10.9 (L) 02/22/2018 0403   HGB 14.2 01/14/2017 0852   HCT 34.8 (L) 02/22/2018 0403   HCT 42.3 01/14/2017 0852    7. Morbid obesity  Body mass index is 42.82 kg/m.  Dietary consulted.  Diet: cardiac diet DVT Prophylaxis: Warfarin  Advance goals of care discussion: full code  Family Communication: no family was present at bedside, at the time of interview.  Disposition:  Discharge to home.  Consultants: PCCM  Procedures: none  Scheduled Meds: . sodium chloride   Intravenous Once  . amiodarone  200 mg Oral Daily  . aspirin EC  81 mg Oral Daily  . atorvastatin  40 mg Oral q1800  . benzonatate  100 mg Oral TID  . DULoxetine  60 mg Oral q1800  . guaiFENesin  1,200 mg Oral BID  . levothyroxine  112 mcg Oral Q0600  . Melatonin  3 mg Oral QHS  . nitroGLYCERIN  1 inch Topical Q8H  . pantoprazole  40 mg Oral Q1200  . polyethylene glycol powder  17 g Oral Daily  . tamsulosin  0.4 mg Oral QPC supper  . Warfarin - Pharmacist Dosing Inpatient   Does not apply q1800   Continuous Infusions: . [START ON 02/23/2018] cefTRIAXone (ROCEPHIN)  IV     PRN  Meds: acetaminophen, albuterol, bisacodyl, fluticasone, traMADol Antibiotics: Anti-infectives (From admission, onward)   Start     Dose/Rate Route Frequency Ordered Stop   02/23/18 0600  vancomycin (VANCOCIN) 2,000 mg in sodium chloride 0.9 % 500 mL IVPB  Status:  Discontinued     2,000 mg 250 mL/hr over 120 Minutes Intravenous Every 48 hours 02/21/18 0658 02/22/18 0753   02/23/18 0300  cefTRIAXone (ROCEPHIN) 2 g in sodium  chloride 0.9 % 100 mL IVPB     2 g 200 mL/hr over 30 Minutes Intravenous Every 24 hours 02/22/18 0824     02/22/18 0400  ceFEPIme (MAXIPIME) 1 g in sodium chloride 0.9 % 100 mL IVPB  Status:  Discontinued     1 g 200 mL/hr over 30 Minutes Intravenous Every 24 hours 02/21/18 0658 02/22/18 0824   02/21/18 0345  ceFEPIme (MAXIPIME) 2 g in sodium chloride 0.9 % 100 mL IVPB     2 g 200 mL/hr over 30 Minutes Intravenous  Once 02/21/18 0334 02/21/18 0430   02/21/18 0345  metroNIDAZOLE (FLAGYL) IVPB 500 mg  Status:  Discontinued     500 mg 100 mL/hr over 60 Minutes Intravenous Every 8 hours 02/21/18 0334 02/21/18 0536   02/21/18 0345  vancomycin (VANCOCIN) IVPB 1000 mg/200 mL premix  Status:  Discontinued     1,000 mg 200 mL/hr over 60 Minutes Intravenous  Once 02/21/18 0334 02/21/18 0336   02/21/18 0345  vancomycin (VANCOCIN) 2,500 mg in sodium chloride 0.9 % 500 mL IVPB     2,500 mg 250 mL/hr over 120 Minutes Intravenous  Once 02/21/18 0336 02/21/18 0740       Objective: Physical Exam: Vitals:   02/22/18 1400 02/22/18 1500 02/22/18 1600 02/22/18 1700  BP: 140/81 105/79  140/85  Pulse: 77 87 (P) 75   Resp: 13 17  14   Temp:  (!) 97.3 F (36.3 C)    TempSrc:  Oral    SpO2: 96% 97%    Weight:      Height:        Intake/Output Summary (Last 24 hours) at 02/22/2018 1720 Last data filed at 02/22/2018 1400 Gross per 24 hour  Intake 2349.62 ml  Output 1825 ml  Net 524.62 ml   Filed Weights   02/21/18 0330  Weight: (!) 143.2 kg   General: Alert, Awake and Oriented to Time, Place and Person. Appear in moderate distress, affect appropriate Eyes: PERRL, Conjunctiva normal ENT: Oral Mucosa clear moist. Neck: no JVD, no Abnormal Mass Or lumps Cardiovascular: S1 and S2 Present, no Murmur, Peripheral Pulses Present Respiratory: increased respiratory effort, Bilateral Air entry equal and Decreased, no use of accessory muscle, no Crackles, bilateral Occasional expiratory  wheezes Abdomen:  Bowel Sound present, Soft and no tenderness, no hernia Skin: no redness, no Rash, no induration Extremities: no Pedal edema, no calf tenderness Neurologic: Grossly no focal neuro deficit. Bilaterally Equal motor strength  Data Reviewed: CBC: Recent Labs  Lab 02/21/18 0317 02/22/18 0403  WBC 21.8* 19.4*  NEUTROABS 18.8*  --   HGB 13.4 10.9*  HCT 42.8 34.8*  MCV 96.2 95.1  PLT 217 152   Basic Metabolic Panel: Recent Labs  Lab 02/21/18 0317 02/21/18 1618 02/22/18 0403  NA 131* 133* 131*  K 4.0 4.6 4.0  CL 93* 104 100  CO2 20* 19* 20*  GLUCOSE 173* 141* 157*  BUN 47* 54* 58*  CREATININE 4.23* 4.68* 5.03*  CALCIUM 8.0* 6.7* 6.7*    Liver Function Tests: Recent Labs  Lab 02/21/18 0317 02/22/18 0403  AST 31 31  ALT 27 25  ALKPHOS 132* 98  BILITOT 1.1 0.8  PROT 6.6 5.6*  ALBUMIN 3.0* 2.1*   No results for input(s): LIPASE, AMYLASE in the last 168 hours. No results for input(s): AMMONIA in the last 168 hours. Coagulation Profile: Recent Labs  Lab 02/21/18 0317 02/22/18 0403 02/22/18 1128  INR 2.96 5.03* 5.78*   Cardiac Enzymes: No results for input(s): CKTOTAL, CKMB, CKMBINDEX, TROPONINI in the last 168 hours. BNP (last 3 results) No results for input(s): PROBNP in the last 8760 hours. CBG: Recent Labs  Lab 02/21/18 1204  GLUCAP 101*   Studies: Dg Chest Port 1 View  Result Date: 02/22/2018 CLINICAL DATA:  Dyspnea, shortness of breath, and upper mid chest discomfort for the past 2 days. Nonproductive cough as well. History of asthma, CHF, trans catheter aortic valve replacement approximately 20 months ago. EXAM: PORTABLE CHEST 1 VIEW COMPARISON:  Portable chest x-ray of February 21, 2018 FINDINGS: The right hemidiaphragm appears higher than the left. Right basilar parenchymal density has developed. The left lung is better inflated. The interstitial markings in the mid and lower left lung are coarse but fairly stable. The left heart border is mildly  prominent. The right heart border is obscured. The pulmonary vascularity is engorged and less distinct with more cephalization. IMPRESSION: CHF with mild interstitial edema. Right basilar atelectasis or pneumonia. Probable small right pleural effusion. Thoracic aortic atherosclerosis. The appearance of the chest has deteriorated since the study of 2 days ago. Electronically Signed   By: David  [place] M.D.   On: 02/22/2018 10:02     Time spent: 35 minutes  Author: Lynden Oxford, MD [facility] Pager: [phone] 02/22/2018 5:20 PM  Between 7PM-7AM, please contact night-coverage at [email or web address], password [facility]

## 2018-02-22 NOTE — Progress Notes (Signed)
NAME:  Jorge [name], MRN:  [id number], DOB:  [birth year], LOS: 1 ADMISSION DATE:  02/21/2018, CONSULTATION DATE:  02/21/2018 REFERRING MD:  Elvera Lennox, CHIEF COMPLAINT:  Hypotension   Brief History   72 year old male admitted with septic shock from a UTI, pyelonephritis.   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 fib, obstructive sleep apnea, GERD, hypertension, diastolic heart failure  Significant Hospital Events   11/17 admitted, hypotensive, transfer to ICU 11/18 hemodynamically stable overnight.  No acute 0.6 L positive.  Urine output remains poor creatinine climbing.  Developed a cough and some shortness of breath overnight.  Blood culture showing gram-negative rods, antibiotics narrowed to mono coverage with ceftriaxone  Consults:  PCCM 11/17  Procedures:    Significant Diagnostic Tests:  February 21, 2018 CT renal stone study showed mild tissue inflammation along ureters with extension into the lower quadrants, underlying pyelogram cannot excluded, enlarged prostate, bibasilar airspace disease due to atelectasis, gallstones but normal-appearing gallbladder otherwise February 21, 2018 CT head mild cortical volume loss, no acute intracranial abnormality, mastoid cell opacification  Micro Data:  11/17 blood > GNR both bottles 11/17 BCID enterobacteriaceae and e coli 11/17 urine >   Antimicrobials:   11/17 cefepime> 11/18 11/17 vanc> 11/18 11/18 rocephin>>>  Interim history/subjective:  C/o cough  Otherwise feels better  Objective   Blood pressure 130/87, pulse 87, temperature 97.6 F (36.4 C), temperature source Oral, resp. rate 19, height 6' (1.829 m), weight (Abnormal) 143.2 kg, SpO2 97 %.        Intake/Output Summary (Last 24 hours) at 02/22/2018 0814 Last data filed at 02/22/2018 0800 Gross per 24 hour  Intake 4290.39 ml  Output 1375 ml  Net 2915.39  ml   Filed Weights   02/21/18 0330  Weight: (Abnormal) 143.2 kg    Examination: General: This is a 72 year old obese white male is currently lying in bed and in no acute distress HEENT normocephalic atraumatic, mild jugular venous distention mucous membranes moist, he has pronounced upper airway wheeze audible over neck Pulmonary: Rhonchorous with expiratory wheeze no accessory use Cardiac: Regular rate and rhythm Abdomen: Soft, not tender no organomegaly Extremities: Warm dry chronic venous stasis lower extremities bilaterally strong pulses Neuro: Awake and oriented no focal deficits today GU: Has Foley catheter in place with minimal clear urine  Resolved Hospital Problem list   Acute metabolic encephalopathy resolved 11/18  Assessment & Plan:   Septic shock in setting of UT source (pyelo): -wbc trending down; no fever spikes -bp stable over night  Anything exciting Normal saline lock Continue telemetry Day #2 antibiotics, changed to Rocephin will complete 8 days therapy Check orthostatics when getting up  Cough with dyspnea -This is occurred overnight, does have a history of diastolic heart dysfunction, however has pronounced upper airway wheezing.  Wonder about volume overload versus just simply occult reflux Plan Increase PPI to twice daily Add flutter and Mucinex (he says he cannot get his mucus expectorated) Mobilize Chest x-ray looking for pulmonary edema, would prefer to give him another day before diuresis however if chest x-ray shows volume overload would resume Lasix today Wean oxygen, check room air saturations  H/o afib Plan Tele the resident seeing him Holding rate control meds Holding home anticoagulation  Coagulopathy (d/t warfarin) -No evidence of active bleeding Plan Vitamin K 5 mg p.o. x1 Repeat INR in the morning see in would prefer to avoid extra volume at  this point  Anemia  Acute kidney injury: No evidence of obstruction on CT  -Foley in  place -scr rising. UOP:  Plan Renal dosing meds Strict I&O MAP > 65 Currently no need for HD but may need renal consult if fxn cont to worsen  Metabolic acidosis.  -NAG Plan Stopping NaCl Trend chemistry Consider bicarb supplementation if acid base worsens   H/o diastolic HF. (grade 1) Plan Holding antihypertensives and diuretics Cont tele monitoring   Best practice:  Diet: NPO Pain/Anxiety/Delirium protocol (if indicated): n/a VAP protocol (if indicated): nn/a DVT prophylaxis: warfarin per pharm GI prophylaxis: n/a Glucose control: SSI Mobility: advance as able Code Status: full Family Communication: none bedside Disposition:    Critical care time:

## 2018-02-22 NOTE — Progress Notes (Signed)
CRITICAL VALUE ALERT  Critical Value:  INR-5.03  Date & Time Notied:  02/22/2018 0610  Provider Notified: Pola CornELINK  Orders Received/Actions taken: AWAITING NEW ORDERS

## 2018-02-22 NOTE — Progress Notes (Signed)
CRITICAL VALUE ALERT  Critical Value: INR 5.78   Date & Time Notied:  02/22/18 1225  Provider Notified: Anders SimmondsPete Babcock NP   Orders Received/Actions taken: No action given at this time

## 2018-02-22 NOTE — Plan of Care (Signed)
  Problem: Clinical Measurements: Goal: Cardiovascular complication will be avoided Outcome: Progressing   Problem: Coping: Goal: Level of anxiety will decrease Outcome: Progressing   

## 2018-02-22 NOTE — Progress Notes (Signed)
eLink Physician-Brief Progress Note Patient Name: Jorge [name] DOB: 09/20/1945 MRN: [id number]   Date of Service  02/22/2018  HPI/Events of Note  Coagulopathy - INR = 5.06. Patient is on Warfarin. Pharmacy dosing.   eICU Interventions  Will order: 1. Transfuse 3 units of FFP. 2. Repeat INR at 12 noon.      Intervention Category Intermediate Interventions: Coagulopathy - evaluation and management  Sommer,Steven Eugene 02/22/2018, 6:23 AM

## 2018-02-23 DIAGNOSIS — R7881 Bacteremia: Secondary | ICD-10-CM

## 2018-02-23 DIAGNOSIS — Z9989 Dependence on other enabling machines and devices: Secondary | ICD-10-CM

## 2018-02-23 DIAGNOSIS — I480 Paroxysmal atrial fibrillation: Secondary | ICD-10-CM

## 2018-02-23 LAB — CBC
HCT: 34.6 % — ABNORMAL LOW (ref 39.0–52.0)
Hemoglobin: 10.8 g/dL — ABNORMAL LOW (ref 13.0–17.0)
MCH: 29.6 pg (ref 26.0–34.0)
MCHC: 31.2 g/dL (ref 30.0–36.0)
MCV: 94.8 fL (ref 80.0–100.0)
NRBC: 0 % (ref 0.0–0.2)
Platelets: 155 10*3/uL (ref 150–400)
RBC: 3.65 MIL/uL — AB (ref 4.22–5.81)
RDW: 14.2 % (ref 11.5–15.5)
WBC: 18.7 10*3/uL — AB (ref 4.0–10.5)

## 2018-02-23 LAB — COMPREHENSIVE METABOLIC PANEL
ALK PHOS: 113 U/L (ref 38–126)
ALT: 25 U/L (ref 0–44)
ANION GAP: 12 (ref 5–15)
AST: 25 U/L (ref 15–41)
Albumin: 2.1 g/dL — ABNORMAL LOW (ref 3.5–5.0)
BILIRUBIN TOTAL: 0.7 mg/dL (ref 0.3–1.2)
BUN: 66 mg/dL — ABNORMAL HIGH (ref 8–23)
CALCIUM: 7.5 mg/dL — AB (ref 8.9–10.3)
CO2: 22 mmol/L (ref 22–32)
CREATININE: 5.24 mg/dL — AB (ref 0.61–1.24)
Chloride: 100 mmol/L (ref 98–111)
GFR, EST AFRICAN AMERICAN: 11 mL/min — AB (ref >60)
GFR, EST NON AFRICAN AMERICAN: 10 mL/min — AB (ref >60)
Glucose, Bld: 141 mg/dL — ABNORMAL HIGH (ref 70–99)
Potassium: 5.3 mmol/L — ABNORMAL HIGH (ref 3.5–5.1)
SODIUM: 134 mmol/L — AB (ref 135–145)
Total Protein: 6.2 g/dL — ABNORMAL LOW (ref 6.5–8.1)

## 2018-02-23 LAB — CULTURE, BLOOD (ROUTINE X 2): Special Requests: ADEQUATE

## 2018-02-23 LAB — PROTIME-INR
INR: 4.67
Prothrombin Time: 43.3 seconds — ABNORMAL HIGH (ref 11.4–15.2)

## 2018-02-23 LAB — MAGNESIUM: MAGNESIUM: 2.5 mg/dL — AB (ref 1.7–2.4)

## 2018-02-23 MED ORDER — POLYETHYLENE GLYCOL 3350 17 G PO PACK
17.0000 g | PACK | Freq: Every day | ORAL | Status: DC
  Administered 2018-02-24 – 2018-02-27 (×4): 17 g via ORAL
  Filled 2018-02-23 (×5): qty 1

## 2018-02-23 MED ORDER — CEFAZOLIN SODIUM-DEXTROSE 1-4 GM/50ML-% IV SOLN
1.0000 g | INTRAVENOUS | Status: DC
  Administered 2018-02-23: 1 g via INTRAVENOUS
  Filled 2018-02-23 (×2): qty 50

## 2018-02-23 NOTE — Progress Notes (Signed)
Blood culture has come back with pan sens e.coli today. D/w with Dr. Caleb PoppNettey and we will de-escalate further to cefazolin. He is still in ARF right now so we will adjust dose for that.  Cefazolin 1g IV q24  Jorge SouthwardMinh [name], PharmD, [place], AAHIVP, CPP Infectious Disease Pharmacist 02/23/2018 10:50 AM

## 2018-02-23 NOTE — Plan of Care (Signed)
VSS. No s&s of infection noted. Pt remains free from falls. Pt tolerating flutter valve. Pt up in bed to chair.

## 2018-02-23 NOTE — Progress Notes (Addendum)
ANTICOAGULATION CONSULT NOTE - Follow Up Consult  Pharmacy Consult for warfarin Indication: atrial fibrillation  No Known Allergies  Patient Measurements: Height: 6' (182.9 cm) Weight: (!) 315 lb 11.2 oz (143.2 kg) IBW/kg (Calculated) : 77.6  Vital Signs: Temp: 97.6 F (36.4 C) (11/19 0728) Temp Source: Oral (11/19 0728) BP: 116/65 (11/19 0700) Pulse Rate: 75 (11/19 0700)  Labs: Recent Labs    02/21/18 0317 02/21/18 1618 02/22/18 0403 02/22/18 1128 02/23/18 0259  HGB 13.4  --  10.9*  --  10.8*  HCT 42.8  --  34.8*  --  34.6*  PLT 217  --  152  --  155  LABPROT 30.4*  --  45.8* 51.0* 43.3*  INR 2.96  --  5.03* 5.78* 4.67*  CREATININE 4.23* 4.68* 5.03*  --  5.24*    Estimated Creatinine Clearance: 18.7 mL/min (A) (by C-G formula based on SCr of 5.24 mg/dL (H)).   Medications:  Scheduled:  . sodium chloride   Intravenous Once  . amiodarone  200 mg Oral Daily  . aspirin EC  81 mg Oral Daily  . atorvastatin  40 mg Oral q1800  . benzonatate  100 mg Oral TID  . DULoxetine  60 mg Oral q1800  . guaiFENesin  1,200 mg Oral BID  . levothyroxine  112 mcg Oral Q0600  . Melatonin  3 mg Oral QHS  . nitroGLYCERIN  1 inch Topical Q8H  . pantoprazole  40 mg Oral Q1200  . polyethylene glycol powder  17 g Oral Daily  . tamsulosin  0.4 mg Oral QPC supper  . Warfarin - Pharmacist Dosing Inpatient   Does not apply q1800    Assessment: Patient admitted 11/17 from ED with chief complaint of Fall x 2 with sepsis with acute renal failure, 2/2 untreated UTI x 1 week. Patient takes warfarin for Afib with h/o CVA Admit INR 2.96  - PTA Coumadin unknown for now - pt unsure of dose, but took 1.5 tabs (not what current med rec says) Last dose 11/15  Patient INR >5 yesterday, received Vitamin K 5 mg x 1 dose.  Patient still supratherapeutic, with current INR 4.67.  CBC stable. Platelets WNL.    Per nurse, no s/sx bleeding.  Goal of Therapy:  INR 2-3 Monitor platelets by  anticoagulation protocol: Yes   Plan:  Continue to hold warfarin and monitor INR daily. Consider additional 5 mg dose of Vitamin K. F/U 11/20  Bret Vanessen 02/23/2018,7:47 AM

## 2018-02-23 NOTE — Progress Notes (Addendum)
PROGRESS NOTE    Jorge [name]  ZOX:[id number] DOB: 15-Aug-1945 DOA: 02/21/2018 PCP: [name], PA-C   Brief Narrative: Jorge [name] is a 72 y.o. male with a history of TAVR, PVD, chronic kidney disease stage III, paroxysmal A. fib, OSA, GERD, HTN, chronic diastolic CHF. Patient presented secondary to cough and dyspnea, found to have E. Coli bacteremia with likely urinary source. Patient transferred to ICU for hypotension/septic shock. On antibiotics, now narrowed to cefazolin for pan-sensitive e. Coli.   Assessment & Plan:   Active Problems:   Aortic stenosis   CAD (coronary artery disease)   HLD (hyperlipidemia)   Essential hypertension   Paroxysmal atrial fibrillation (HCC)   Abdominal pain   History of cerebrovascular accident (CVA) with residual deficit   BPH (benign prostatic hyperplasia)   Obesity, Class III, BMI 40-49.9 (morbid obesity) (HCC)   Chronic intermittent hypoxia with obstructive sleep apnea   OSA on CPAP   Acute on chronic diastolic congestive heart failure (HCC)   Anticoagulated on warfarin   Severe sepsis (HCC)   AKI (acute kidney injury) (HCC)   CKD (chronic kidney disease) stage 3, GFR 30-59 ml/min (HCC)   Septic shock Secondary to acute pyelonephritis/bacteremia. Patient improved with IV fluids. Pressors not started. Resolved.  E. Coli bacteremia Acute pyelonephritis Patient started on Vancomycin/cefepime empirically then transitioned to ceftriaxone. Blood culture significant for pan-sensitive E. Coli. Leukocytosis improving. -Switch to cefazolin -PT eval  Acute kidney injury on CKD stage 3 Secondary to hypotension and ATN. Baseline creatinine of 1-1.3. Up to 5.24 today. Rate of climb decreasing. UOP of 2.7L. -Continue daily BMP. If urine output worsens or other concerns related to kidney injury develop, would consult nephrology  Hyperkalemia In setting of kidney injury. Mild. Asymptomatic.  Chronic diastolic heart  failure Euvolemic. Grade 1 diastolic dysfunction with a normal EF.  Paroxysmal atrial fibrillation Rate controlled -Continue amiodarone and warfarin  CAD No chest pain -Continue aspirin and Lipitor  Hypothyroidism -Continue Synthroid  Anemia Likely secondary to dilution effect. No acute bleeding. Stable.  Rhonchi In setting of mild acute pulmonary edema. Patient currently appears to be diuresing well. -Flutter/albuterol prn -Watch urinary output for adequate diuresis  Morbid obesity Body mass index is 42.82 kg/m.  -----     DVT prophylaxis: Coumadin Code Status:   Code Status: Full Code Family Communication: None at bedside Disposition Plan: Transfer to medical floor   Consultants:   Critical care medicine  Procedures:   None  Antimicrobials:  Vancomycin (11/16)  Cefepime (11/16>>11/17)  Ceftriaxone (11/18)  Cefazolin (11/19>>    Subjective: Having a bowel movement. No other concerns.  Objective: Vitals:   02/23/18 1100 02/23/18 1123 02/23/18 1200 02/23/18 1300  BP: (!) 141/88  135/76 (!) 145/114  Pulse: 79  68 68  Resp: 17  12 11   Temp:  97.8 F (36.6 C)    TempSrc:  Oral    SpO2: 96%  97% 97%  Weight:      Height:        Intake/Output Summary (Last 24 hours) at 02/23/2018 1336 Last data filed at 02/23/2018 1250 Gross per 24 hour  Intake 226.31 ml  Output 2800 ml  Net -2573.69 ml   Filed Weights   02/21/18 0330  Weight: (!) 143.2 kg    Examination:  General exam: Appears calm and comfortable Respiratory system: Rhonchi bilaterally. Respiratory effort normal. Cardiovascular system: S1 & S2 heard, irregular rhythm with normal rate. No murmurs, rubs, gallops or clicks. Gastrointestinal system: Abdomen is  nondistended, soft and nontender. No organomegaly or masses felt. Normal bowel sounds heard. Central nervous system: Alert and oriented. No focal neurological deficits. Extremities: No edema. No calf tenderness Skin: No  cyanosis. No rashes Psychiatry: Judgement and insight appear normal. Mood & affect appropriate.     Data Reviewed: I have personally reviewed following labs and imaging studies  CBC: Recent Labs  Lab 02/21/18 0317 02/22/18 0403 02/23/18 0259  WBC 21.8* 19.4* 18.7*  NEUTROABS 18.8*  --   --   HGB 13.4 10.9* 10.8*  HCT 42.8 34.8* 34.6*  MCV 96.2 95.1 94.8  PLT 217 152 155   Basic Metabolic Panel: Recent Labs  Lab 02/21/18 0317 02/21/18 1618 02/22/18 0403 02/23/18 0259  NA 131* 133* 131* 134*  K 4.0 4.6 4.0 5.3*  CL 93* 104 100 100  CO2 20* 19* 20* 22  GLUCOSE 173* 141* 157* 141*  BUN 47* 54* 58* 66*  CREATININE 4.23* 4.68* 5.03* 5.24*  CALCIUM 8.0* 6.7* 6.7* 7.5*  MG  --   --   --  2.5*   GFR: Estimated Creatinine Clearance: 18.7 mL/min (A) (by C-G formula based on SCr of 5.24 mg/dL (H)). Liver Function Tests: Recent Labs  Lab 02/21/18 0317 02/22/18 0403 02/23/18 0259  AST 31 31 25   ALT 27 25 25   ALKPHOS 132* 98 113  BILITOT 1.1 0.8 0.7  PROT 6.6 5.6* 6.2*  ALBUMIN 3.0* 2.1* 2.1*   No results for input(s): LIPASE, AMYLASE in the last 168 hours. No results for input(s): AMMONIA in the last 168 hours. Coagulation Profile: Recent Labs  Lab 02/21/18 0317 02/22/18 0403 02/22/18 1128 02/23/18 0259  INR 2.96 5.03* 5.78* 4.67*   Cardiac Enzymes: No results for input(s): CKTOTAL, CKMB, CKMBINDEX, TROPONINI in the last 168 hours. BNP (last 3 results) No results for input(s): PROBNP in the last 8760 hours. HbA1C: No results for input(s): HGBA1C in the last 72 hours. CBG: Recent Labs  Lab 02/21/18 1204  GLUCAP 101*   Lipid Profile: No results for input(s): CHOL, HDL, LDLCALC, TRIG, CHOLHDL, LDLDIRECT in the last 72 hours. Thyroid Function Tests: No results for input(s): TSH, T4TOTAL, FREET4, T3FREE, THYROIDAB in the last 72 hours. Anemia Panel: No results for input(s): VITAMINB12, FOLATE, FERRITIN, TIBC, IRON, RETICCTPCT in the last 72 hours. Sepsis  Labs: Recent Labs  Lab 02/21/18 0318 02/21/18 0531  LATICACIDVEN 2.53* 1.81    Recent Results (from the past 240 hour(s))  Blood culture (routine x 2)     Status: Abnormal   Collection Time: 02/21/18  3:06 AM  Result Value Ref Range Status   Specimen Description BLOOD LEFT HAND  Final   Special Requests   Final    BOTTLES DRAWN AEROBIC AND ANAEROBIC Blood Culture adequate volume   Culture  Setup Time   Final    GRAM NEGATIVE RODS IN BOTH AEROBIC AND ANAEROBIC BOTTLES CRITICAL RESULT CALLED TO, READ BACK BY AND VERIFIED WITH: PHARMD K PIRCE [phone] BY GF Performed at [facility], [address], [place], [place] [address]    Culture ESCHERICHIA COLI (A)  Final   Report Status 02/23/2018 FINAL  Final   Organism ID, Bacteria ESCHERICHIA COLI  Final      Susceptibility   Escherichia coli - MIC*    AMPICILLIN <=2 SENSITIVE Sensitive     CEFAZOLIN <=4 SENSITIVE Sensitive     CEFEPIME <=1 SENSITIVE Sensitive     CEFTAZIDIME <=1 SENSITIVE Sensitive     CEFTRIAXONE <=1 SENSITIVE Sensitive  CIPROFLOXACIN <=0.25 SENSITIVE Sensitive     GENTAMICIN <=1 SENSITIVE Sensitive     IMIPENEM <=0.25 SENSITIVE Sensitive     TRIMETH/SULFA <=20 SENSITIVE Sensitive     AMPICILLIN/SULBACTAM <=2 SENSITIVE Sensitive     PIP/TAZO <=4 SENSITIVE Sensitive     Extended ESBL NEGATIVE Sensitive     * ESCHERICHIA COLI  Blood Culture ID Panel (Reflexed)     Status: Abnormal   Collection Time: 02/21/18  3:06 AM  Result Value Ref Range Status   Enterococcus species NOT DETECTED NOT DETECTED Final   Listeria monocytogenes NOT DETECTED NOT DETECTED Final   Staphylococcus species NOT DETECTED NOT DETECTED Final   Staphylococcus aureus (BCID) NOT DETECTED NOT DETECTED Final   Streptococcus species NOT DETECTED NOT DETECTED Final   Streptococcus agalactiae NOT DETECTED NOT DETECTED Final   Streptococcus pneumoniae NOT DETECTED NOT DETECTED Final   Streptococcus pyogenes NOT DETECTED NOT DETECTED  Final   Acinetobacter baumannii NOT DETECTED NOT DETECTED Final   Enterobacteriaceae species DETECTED (A) NOT DETECTED Final    Comment: Enterobacteriaceae represent a large family of gram-negative bacteria, not a single organism. CRITICAL RESULT CALLED TO, READ BACK BY AND VERIFIED WITH: PHARMD K PIRCE [phone] BY GF    Enterobacter cloacae complex NOT DETECTED NOT DETECTED Final   Escherichia coli DETECTED (A) NOT DETECTED Final    Comment: PHARMD K PIRCE [phone] BY GF   Klebsiella oxytoca NOT DETECTED NOT DETECTED Final   Klebsiella pneumoniae NOT DETECTED NOT DETECTED Final   Proteus species NOT DETECTED NOT DETECTED Final   Serratia marcescens NOT DETECTED NOT DETECTED Final   Carbapenem resistance NOT DETECTED NOT DETECTED Final   Haemophilus influenzae NOT DETECTED NOT DETECTED Final   Neisseria meningitidis NOT DETECTED NOT DETECTED Final   Pseudomonas aeruginosa NOT DETECTED NOT DETECTED Final   Candida albicans NOT DETECTED NOT DETECTED Final   Candida glabrata NOT DETECTED NOT DETECTED Final   Candida krusei NOT DETECTED NOT DETECTED Final   Candida parapsilosis NOT DETECTED NOT DETECTED Final   Candida tropicalis NOT DETECTED NOT DETECTED Final    Comment: Performed at [facility], [address] [place], [place] [address]  Blood culture (routine x 2)     Status: None (Preliminary result)   Collection Time: 02/21/18  5:20 AM  Result Value Ref Range Status   Specimen Description BLOOD RIGHT HAND  Final   Special Requests   Final    BOTTLES DRAWN AEROBIC AND ANAEROBIC Blood Culture adequate volume   Culture   Final    NO GROWTH 1 DAY Performed at [facility], [address] [place], [place] [address]    Report Status PENDING  Incomplete  Urine culture     Status: Abnormal (Preliminary result)   Collection Time: 02/21/18  6:15 AM  Result Value Ref Range Status   Specimen Description URINE, CATHETERIZED  Final   Special Requests   Final     NONE Performed at [facility], [address] [place], [place] [address]    Culture 40,000 COLONIES/mL ESCHERICHIA COLI (A)  Final   Report Status PENDING  Incomplete  MRSA PCR Screening     Status: None   Collection Time: 02/21/18  8:53 AM  Result Value Ref Range Status   MRSA by PCR NEGATIVE NEGATIVE Final    Comment:        The GeneXpert MRSA Assay (FDA approved for NASAL specimens only), is one component of a comprehensive  MRSA colonization surveillance program. It is not intended to diagnose MRSA infection nor to guide or monitor treatment for MRSA infections. Performed at [facility], [address] [place], [place] [address]          Radiology Studies: Dg Chest Port 1 View  Result Date: 02/22/2018 CLINICAL DATA:  Dyspnea, shortness of breath, and upper mid chest discomfort for the past 2 days. Nonproductive cough as well. History of asthma, CHF, trans catheter aortic valve replacement approximately 20 months ago. EXAM: PORTABLE CHEST 1 VIEW COMPARISON:  Portable chest x-ray of February 21, 2018 FINDINGS: The right hemidiaphragm appears higher than the left. Right basilar parenchymal density has developed. The left lung is better inflated. The interstitial markings in the mid and lower left lung are coarse but fairly stable. The left heart border is mildly prominent. The right heart border is obscured. The pulmonary vascularity is engorged and less distinct with more cephalization. IMPRESSION: CHF with mild interstitial edema. Right basilar atelectasis or pneumonia. Probable small right pleural effusion. Thoracic aortic atherosclerosis. The appearance of the chest has deteriorated since the study of 2 days ago. Electronically Signed   By: David  [place] M.D.   On: 02/22/2018 10:02        Scheduled Meds: . sodium chloride   Intravenous Once  . amiodarone  200 mg Oral Daily  . aspirin EC  81 mg Oral Daily  . atorvastatin  40 mg Oral q1800  . benzonatate   100 mg Oral TID  . DULoxetine  60 mg Oral q1800  . guaiFENesin  1,200 mg Oral BID  . levothyroxine  112 mcg Oral Q0600  . Melatonin  3 mg Oral QHS  . pantoprazole  40 mg Oral Q1200  . polyethylene glycol  17 g Oral Daily  . tamsulosin  0.4 mg Oral QPC supper  . Warfarin - Pharmacist Dosing Inpatient   Does not apply q1800   Continuous Infusions: .  ceFAZolin (ANCEF) IV       LOS: 2 days     Jacquelin Hawking, MD [facility] 02/23/2018, 1:36 PM  If 7PM-7AM, please contact night-coverage [email or web address]

## 2018-02-23 NOTE — Progress Notes (Signed)
CRITICAL VALUE ALERT  Critical Value:  INR-4.67  Date & Time Notied:  4:50 AM.02/23/2018  Provider Notified: Dr. Arsenio LoaderSommer  Orders Received/Actions taken: awaiting orders

## 2018-02-23 NOTE — Progress Notes (Signed)
[name] is a 72 y.o. male tranfer from ICU awake, alert - oriented  X 4 - no acute distress noted. IV in place, occlusive dsg intact without redness.  Orientation to room, and floor completed with information packet given to patient/family.  Patient declined safety video at this time.  Admission INP armband ID verified with patient/family, and in place.   SR up x 2, fall assessment complete, with patient able to verbalize understanding of risk associated with falls, and verbalized understanding to call nsg before up out of bed.  Call light within reach, patient able to voice, and demonstrate understanding.  Skin, clean-dry- intact without evidence of bruising, or skin tears.   No evidence of skin break down noted on exam.     Will cont to eval and treat per MD orders.  Jeralene PetersVictoria A Dustie Brittle, RN 02/23/2018 4:18 PM

## 2018-02-23 NOTE — Progress Notes (Signed)
Attempted to call report, will try again in a few.  

## 2018-02-24 DIAGNOSIS — R6520 Severe sepsis without septic shock: Secondary | ICD-10-CM

## 2018-02-24 LAB — CBC
HCT: 32.4 % — ABNORMAL LOW (ref 39.0–52.0)
HEMOGLOBIN: 10.7 g/dL — AB (ref 13.0–17.0)
MCH: 30.7 pg (ref 26.0–34.0)
MCHC: 33 g/dL (ref 30.0–36.0)
MCV: 93.1 fL (ref 80.0–100.0)
Platelets: 160 10*3/uL (ref 150–400)
RBC: 3.48 MIL/uL — ABNORMAL LOW (ref 4.22–5.81)
RDW: 14.5 % (ref 11.5–15.5)
WBC: 15.2 10*3/uL — AB (ref 4.0–10.5)
nRBC: 0 % (ref 0.0–0.2)

## 2018-02-24 LAB — FOLATE: FOLATE: 9.5 ng/mL (ref >5.9)

## 2018-02-24 LAB — BASIC METABOLIC PANEL
Anion gap: 11 (ref 5–15)
BUN: 69 mg/dL — ABNORMAL HIGH (ref 8–23)
CALCIUM: 7.8 mg/dL — AB (ref 8.9–10.3)
CO2: 22 mmol/L (ref 22–32)
Chloride: 104 mmol/L (ref 98–111)
Creatinine, Ser: 4.86 mg/dL — ABNORMAL HIGH (ref 0.61–1.24)
GFR, EST AFRICAN AMERICAN: 13 mL/min — AB (ref >60)
GFR, EST NON AFRICAN AMERICAN: 11 mL/min — AB (ref >60)
Glucose, Bld: 116 mg/dL — ABNORMAL HIGH (ref 70–99)
Potassium: 4.5 mmol/L (ref 3.5–5.1)
SODIUM: 137 mmol/L (ref 135–145)

## 2018-02-24 LAB — PROTIME-INR
INR: 3.53
Prothrombin Time: 34.8 seconds — ABNORMAL HIGH (ref 11.4–15.2)

## 2018-02-24 LAB — URINE CULTURE

## 2018-02-24 LAB — IRON AND TIBC
Iron: 25 ug/dL — ABNORMAL LOW (ref 45–182)
Saturation Ratios: 13 % — ABNORMAL LOW (ref 17.9–39.5)
TIBC: 186 ug/dL — ABNORMAL LOW (ref 250–450)
UIBC: 161 ug/dL

## 2018-02-24 LAB — RETICULOCYTES
Immature Retic Fract: 17.8 % — ABNORMAL HIGH (ref 2.3–15.9)
RBC.: 3.55 MIL/uL — AB (ref 4.22–5.81)
RETIC COUNT ABSOLUTE: 18.8 10*3/uL — AB (ref 19.0–186.0)
Retic Ct Pct: 0.5 % (ref 0.4–3.1)

## 2018-02-24 LAB — VITAMIN B12: VITAMIN B 12: 333 pg/mL (ref 180–914)

## 2018-02-24 LAB — FERRITIN: Ferritin: 565 ng/mL — ABNORMAL HIGH (ref 24–336)

## 2018-02-24 MED ORDER — CEFAZOLIN SODIUM-DEXTROSE 1-4 GM/50ML-% IV SOLN
1.0000 g | Freq: Two times a day (BID) | INTRAVENOUS | Status: DC
  Administered 2018-02-24 – 2018-02-26 (×6): 1 g via INTRAVENOUS
  Filled 2018-02-24 (×7): qty 50

## 2018-02-24 MED ORDER — SODIUM CHLORIDE 0.9 % IV SOLN
INTRAVENOUS | Status: DC
  Administered 2018-02-24 – 2018-02-25 (×2): via INTRAVENOUS

## 2018-02-24 MED ORDER — OXYMETAZOLINE HCL 0.05 % NA SOLN
1.0000 | Freq: Two times a day (BID) | NASAL | Status: DC | PRN
  Administered 2018-02-24 (×2): 1 via NASAL
  Filled 2018-02-24: qty 15

## 2018-02-24 NOTE — Progress Notes (Signed)
PT Progress Note for Charges    02/24/18 1600  PT General Charges  $$ ACUTE PT VISIT 1 Visit  PT Evaluation  $PT Eval Moderate Complexity 1 Mod  PT Treatments  $Therapeutic Activity 8-22 mins  Deborah ChalkJennifer Hiedi Touchton, PT, DPT  Acute Rehabilitation Services Pager [phone] Office [phone]

## 2018-02-24 NOTE — Progress Notes (Addendum)
ANTICOAGULATION CONSULT NOTE - Follow Up Consult Pharmacy Antibiotic Note  Jorge [name] is a 72 y.o. male admitted on 02/21/2018 with urosepsis & afib with hx of CVA.  Pharmacy has been consulted for cefazolin and warfarin dosing.   Pt is afebrile with an elevated but decreasing WBC 15.2 with blood & urine cultures growing pan sensitive e coli. Pt's AKI is improving.   Pt was on warfarin PTA 7.5mg  daily for afib & hx of CVA with LD 11/16. Pt's INR was therapeutic on admission, but became elevated. Pt was given Vit K 5mg  x1 on 11/18 and experienced nose bleed 11/20 AM. Hgb 10.7 (low/stable) INR still supratherapeutic at 3.53.  Plan: Increase cefazolin to 1g IV every 12 hours Monitor renal function & clinical status  Hold warfarin Daily INR Monitor s/s bleeding  Goal of Therapy:  INR 2-3 Monitor platelets by anticoagulation protocol: Yes  Height: 6' (182.9 cm) Weight: (!) 315 lb 11.2 oz (143.2 kg) IBW/kg (Calculated) : 77.6  Temp (24hrs), Avg:98 F (36.7 C), Min:97.6 F (36.4 C), Max:98.7 F (37.1 C)  Recent Labs  Lab 02/21/18 0317 02/21/18 0318 02/21/18 0531 02/21/18 1618 02/22/18 0403 02/23/18 0259 02/24/18 0614  WBC 21.8*  --   --   --  19.4* 18.7* 15.2*  CREATININE 4.23*  --   --  4.68* 5.03* 5.24* 4.86*  LATICACIDVEN  --  2.53* 1.81  --   --   --   --      02/22/18 0403 02/22/18 1128 02/23/18 0259 02/24/18 0614  HGB 10.9*  --  10.8* 10.7*  HCT 34.8*  --  34.6* 32.4*  PLT 152  --  155 160  LABPROT 45.8* 51.0* 43.3* 34.8*  INR 5.03* 5.78* 4.67* 3.53  CREATININE 5.03*  --  5.24* 4.86*    Estimated Creatinine Clearance: 20.2 mL/min (A) (by C-G formula based on SCr of 4.86 mg/dL (H)).    No Known Allergies  Antimicrobials this admission: Vanco 11/17 >> 11/18 Cefepime 11/17 >> 11/18 Ceftriaxone 11/19 >11/19 Cefazolin 11/19>>  Microbiology results: 11/17 Bcx (left hand) >> e.coli - pan sens 11/17 Bcx (right hand) >> ngtd 2d 11/17 Ucx >> 40k e coli  - pan sens 11/17 UA >> many bacteria  Thank you for allowing pharmacy to be a part of this patient's care.  Wess Bottsmma  Stinson 02/24/2018 10:49 AM   I discussed / reviewed the pharmacy note by Wess BottsEmma Stinson, Pharm D. Student and I agree with the resident's findings and plans as documented. Scr decreased to 4.86, Crcl ~4920mlmin, thus cefazolin dose adjusted to q12 hour dosing for bacteremia.  Noah Delaineuth Lazlo Tunney, RPh Clinical Pharmacist Please check AMION for all [facility] phone numbers After 10:00 PM, call Main Pharmacy [phone] 02/24/2018  11:12 AM

## 2018-02-24 NOTE — Progress Notes (Signed)
PROGRESS NOTE    Jorge [name]  ZOX:[id number] DOB: 1945-05-28 DOA: 02/21/2018 PCP: [name], PA-C   Brief Narrative:  Jorge [name] is a 72 y.o. male with a history of TAVR, PVD, chronic kidney disease stage III, paroxysmal A. fib, OSA, GERD, HTN, chronic diastolic CHF. Patient presented secondary to cough and dyspnea, found to have E. Coli bacteremia with likely urinary source. Patient transferred to ICU for hypotension/septic shock. On antibiotics, now narrowed to cefazolin for pan-sensitive e. Coli.   work-up significant for acute renal failure reasoning peaked at 5.4  Subjective: Having some nasal bleed overnight, currently resolved  Assessment & Plan:   Active Problems:   Aortic stenosis   CAD (coronary artery disease)   HLD (hyperlipidemia)   Essential hypertension   Paroxysmal atrial fibrillation (HCC)   Abdominal pain   History of cerebrovascular accident (CVA) with residual deficit   BPH (benign prostatic hyperplasia)   Obesity, Class III, BMI 40-49.9 (morbid obesity) (HCC)   Chronic intermittent hypoxia with obstructive sleep apnea   OSA on CPAP   Acute on chronic diastolic congestive heart failure (HCC)   Anticoagulated on warfarin   Severe sepsis (HCC)   AKI (acute kidney injury) (HCC)   CKD (chronic kidney disease) stage 3, GFR 30-59 ml/min (HCC)   Septic shock - Secondary to acute pyelonephritis/bacteremia.  Pandit to IV fluids, no requirement for pressors .  E. Coli bacteremia/Acute pyelonephritis -Sepsis present  on admission, hypotensive, elevated lactic acid, renal failure, leukocytosis -Blood cultures/urine culture growing pansensitive E. coli, currently on cefazolin, was empirically on vancomycin/cefepime. -Continue with IV antibiotics during hospital stay, will transition to oral regimen on discharge.  Acute kidney injury on CKD stage 3 -Plan creatinine is 1, peaked at 5.2 during hospital stay, most likely in the setting of ATN  hypotension/septic shock , continue with IV fluids, avoid nephrotoxic medication . -Good urine output 2.5 L over last 24 hours, will DC Foley catheter.  Hyperkalemia In setting of kidney injury. Mild. Asymptomatic.  Chronic diastolic heart failure Euvolemic. Grade 1 diastolic dysfunction with a normal EF. Monitor volume status as started on IV fluids  Paroxysmal atrial fibrillation Rate controlled -Continue amiodarone and warfarin  CAD No chest pain -Continue aspirin and Lipitor  Hypothyroidism -Continue Synthroid  Normocytic anemia - We will obtain anemia panel   Morbid obesity Body mass index is 42.82 kg/m.  -----     DVT prophylaxis: Coumadin Code Status:   Code Status: Full Code Family Communication: None at bedside Disposition Plan: Transfer to medical floor   Consultants:   Critical care medicine  Procedures:   None  Antimicrobials:  Vancomycin (11/16)  Cefepime (11/16>>11/17)  Ceftriaxone (11/18)  Cefazolin (11/19>>      Objective: Vitals:   02/23/18 1600 02/23/18 2201 02/23/18 2239 02/24/18 0353  BP: 119/72 (!) 148/79 (!) 102/56 136/72  Pulse: 66 83 77 90  Resp: 18 20 18 20   Temp: 98.7 F (37.1 C) 98.2 F (36.8 C)  97.6 F (36.4 C)  TempSrc: Oral Oral  Oral  SpO2: 94% 93% 96% 93%  Weight:      Height:        Intake/Output Summary (Last 24 hours) at 02/24/2018 1045 Last data filed at 02/24/2018 0452 Gross per 24 hour  Intake 250 ml  Output 2025 ml  Net -1775 ml   Filed Weights   02/21/18 0330  Weight: (!) 143.2 kg    Examination:  Awake Alert, Oriented X 3, No new F.N deficits, Normal  affect Symmetrical Chest wall movement, Good air movement bilaterally, CTAB Irregular irregular,No Gallops,Rubs or new Murmurs, No Parasternal Heave +ve B.Sounds, Abd Soft, No tenderness, No rebound - guarding or rigidity. No Cyanosis, Clubbing or edema, No new Rash or bruise       Data Reviewed: I have personally reviewed following  labs and imaging studies  CBC: Recent Labs  Lab 02/21/18 0317 02/22/18 0403 02/23/18 0259 02/24/18 0614  WBC 21.8* 19.4* 18.7* 15.2*  NEUTROABS 18.8*  --   --   --   HGB 13.4 10.9* 10.8* 10.7*  HCT 42.8 34.8* 34.6* 32.4*  MCV 96.2 95.1 94.8 93.1  PLT 217 152 155 160   Basic Metabolic Panel: Recent Labs  Lab 02/21/18 0317 02/21/18 1618 02/22/18 0403 02/23/18 0259 02/24/18 0614  NA 131* 133* 131* 134* 137  K 4.0 4.6 4.0 5.3* 4.5  CL 93* 104 100 100 104  CO2 20* 19* 20* 22 22  GLUCOSE 173* 141* 157* 141* 116*  BUN 47* 54* 58* 66* 69*  CREATININE 4.23* 4.68* 5.03* 5.24* 4.86*  CALCIUM 8.0* 6.7* 6.7* 7.5* 7.8*  MG  --   --   --  2.5*  --    GFR: Estimated Creatinine Clearance: 20.2 mL/min (A) (by C-G formula based on SCr of 4.86 mg/dL (H)). Liver Function Tests: Recent Labs  Lab 02/21/18 0317 02/22/18 0403 02/23/18 0259  AST 31 31 25   ALT 27 25 25   ALKPHOS 132* 98 113  BILITOT 1.1 0.8 0.7  PROT 6.6 5.6* 6.2*  ALBUMIN 3.0* 2.1* 2.1*   No results for input(s): LIPASE, AMYLASE in the last 168 hours. No results for input(s): AMMONIA in the last 168 hours. Coagulation Profile: Recent Labs  Lab 02/21/18 0317 02/22/18 0403 02/22/18 1128 02/23/18 0259 02/24/18 0614  INR 2.96 5.03* 5.78* 4.67* 3.53   Cardiac Enzymes: No results for input(s): CKTOTAL, CKMB, CKMBINDEX, TROPONINI in the last 168 hours. BNP (last 3 results) No results for input(s): PROBNP in the last 8760 hours. HbA1C: No results for input(s): HGBA1C in the last 72 hours. CBG: Recent Labs  Lab 02/21/18 1204  GLUCAP 101*   Lipid Profile: No results for input(s): CHOL, HDL, LDLCALC, TRIG, CHOLHDL, LDLDIRECT in the last 72 hours. Thyroid Function Tests: No results for input(s): TSH, T4TOTAL, FREET4, T3FREE, THYROIDAB in the last 72 hours. Anemia Panel: No results for input(s): VITAMINB12, FOLATE, FERRITIN, TIBC, IRON, RETICCTPCT in the last 72 hours. Sepsis Labs: Recent Labs  Lab  02/21/18 0318 02/21/18 0531  LATICACIDVEN 2.53* 1.81    Recent Results (from the past 240 hour(s))  Blood culture (routine x 2)     Status: Abnormal   Collection Time: 02/21/18  3:06 AM  Result Value Ref Range Status   Specimen Description BLOOD LEFT HAND  Final   Special Requests   Final    BOTTLES DRAWN AEROBIC AND ANAEROBIC Blood Culture adequate volume   Culture  Setup Time   Final    GRAM NEGATIVE RODS IN BOTH AEROBIC AND ANAEROBIC BOTTLES CRITICAL RESULT CALLED TO, READ BACK BY AND VERIFIED WITH: PHARMD K PIRCE [phone] BY GF Performed at [facility] [place], [address], [place], [place] [address]    Culture ESCHERICHIA COLI (A)  Final   Report Status 02/23/2018 FINAL  Final   Organism ID, Bacteria ESCHERICHIA COLI  Final      Susceptibility   Escherichia coli - MIC*    AMPICILLIN <=2 SENSITIVE Sensitive     CEFAZOLIN <=4 SENSITIVE  Sensitive     CEFEPIME <=1 SENSITIVE Sensitive     CEFTAZIDIME <=1 SENSITIVE Sensitive     CEFTRIAXONE <=1 SENSITIVE Sensitive     CIPROFLOXACIN <=0.25 SENSITIVE Sensitive     GENTAMICIN <=1 SENSITIVE Sensitive     IMIPENEM <=0.25 SENSITIVE Sensitive     TRIMETH/SULFA <=20 SENSITIVE Sensitive     AMPICILLIN/SULBACTAM <=2 SENSITIVE Sensitive     PIP/TAZO <=4 SENSITIVE Sensitive     Extended ESBL NEGATIVE Sensitive     * ESCHERICHIA COLI  Blood Culture ID Panel (Reflexed)     Status: Abnormal   Collection Time: 02/21/18  3:06 AM  Result Value Ref Range Status   Enterococcus species NOT DETECTED NOT DETECTED Final   Listeria monocytogenes NOT DETECTED NOT DETECTED Final   Staphylococcus species NOT DETECTED NOT DETECTED Final   Staphylococcus aureus (BCID) NOT DETECTED NOT DETECTED Final   Streptococcus species NOT DETECTED NOT DETECTED Final   Streptococcus agalactiae NOT DETECTED NOT DETECTED Final   Streptococcus pneumoniae NOT DETECTED NOT DETECTED Final   Streptococcus pyogenes NOT DETECTED NOT DETECTED Final   Acinetobacter  baumannii NOT DETECTED NOT DETECTED Final   Enterobacteriaceae species DETECTED (A) NOT DETECTED Final    Comment: Enterobacteriaceae represent a large family of gram-negative bacteria, not a single organism. CRITICAL RESULT CALLED TO, READ BACK BY AND VERIFIED WITH: PHARMD K PIRCE [phone] BY GF    Enterobacter cloacae complex NOT DETECTED NOT DETECTED Final   Escherichia coli DETECTED (A) NOT DETECTED Final    Comment: PHARMD K PIRCE [phone] BY GF   Klebsiella oxytoca NOT DETECTED NOT DETECTED Final   Klebsiella pneumoniae NOT DETECTED NOT DETECTED Final   Proteus species NOT DETECTED NOT DETECTED Final   Serratia marcescens NOT DETECTED NOT DETECTED Final   Carbapenem resistance NOT DETECTED NOT DETECTED Final   Haemophilus influenzae NOT DETECTED NOT DETECTED Final   Neisseria meningitidis NOT DETECTED NOT DETECTED Final   Pseudomonas aeruginosa NOT DETECTED NOT DETECTED Final   Candida albicans NOT DETECTED NOT DETECTED Final   Candida glabrata NOT DETECTED NOT DETECTED Final   Candida krusei NOT DETECTED NOT DETECTED Final   Candida parapsilosis NOT DETECTED NOT DETECTED Final   Candida tropicalis NOT DETECTED NOT DETECTED Final    Comment: Performed at [facility], [address], [place], [place] [address]  Blood culture (routine x 2)     Status: None (Preliminary result)   Collection Time: 02/21/18  5:20 AM  Result Value Ref Range Status   Specimen Description BLOOD RIGHT HAND  Final   Special Requests   Final    BOTTLES DRAWN AEROBIC AND ANAEROBIC Blood Culture adequate volume   Culture   Final    NO GROWTH 2 DAYS Performed at [facility], [address] [place], [place] [address]    Report Status PENDING  Incomplete  Urine culture     Status: Abnormal   Collection Time: 02/21/18  6:15 AM  Result Value Ref Range Status   Specimen Description URINE, CATHETERIZED  Final   Special Requests   Final    NONE Performed at [facility], [address], [place], [place] [address]    Culture 40,000 COLONIES/mL ESCHERICHIA COLI (A)  Final   Report Status 02/24/2018 FINAL  Final   Organism ID, Bacteria ESCHERICHIA COLI (A)  Final      Susceptibility   Escherichia coli - MIC*    AMPICILLIN <=2 SENSITIVE Sensitive  CEFAZOLIN <=4 SENSITIVE Sensitive     CEFTRIAXONE <=1 SENSITIVE Sensitive     CIPROFLOXACIN <=0.25 SENSITIVE Sensitive     GENTAMICIN <=1 SENSITIVE Sensitive     IMIPENEM <=0.25 SENSITIVE Sensitive     NITROFURANTOIN <=16 SENSITIVE Sensitive     TRIMETH/SULFA <=20 SENSITIVE Sensitive     AMPICILLIN/SULBACTAM <=2 SENSITIVE Sensitive     PIP/TAZO <=4 SENSITIVE Sensitive     Extended ESBL NEGATIVE Sensitive     * 40,000 COLONIES/mL ESCHERICHIA COLI  MRSA PCR Screening     Status: None   Collection Time: 02/21/18  8:53 AM  Result Value Ref Range Status   MRSA by PCR NEGATIVE NEGATIVE Final    Comment:        The GeneXpert MRSA Assay (FDA approved for NASAL specimens only), is one component of a comprehensive MRSA colonization surveillance program. It is not intended to diagnose MRSA infection nor to guide or monitor treatment for MRSA infections. Performed at [facility], [address] [place], [place] [address]          Radiology Studies: No results found.      Scheduled Meds: . sodium chloride   Intravenous Once  . amiodarone  200 mg Oral Daily  . aspirin EC  81 mg Oral Daily  . atorvastatin  40 mg Oral q1800  . benzonatate  100 mg Oral TID  . DULoxetine  60 mg Oral q1800  . guaiFENesin  1,200 mg Oral BID  . levothyroxine  112 mcg Oral Q0600  . Melatonin  3 mg Oral QHS  . pantoprazole  40 mg Oral Q1200  . polyethylene glycol  17 g Oral Daily  . tamsulosin  0.4 mg Oral QPC supper  . Warfarin - Pharmacist Dosing Inpatient   Does not apply q1800   Continuous Infusions: . sodium chloride    .  ceFAZolin (ANCEF) IV Stopped (02/23/18 2319)     LOS: 3 days     Huey Bienenstockawood Kayo Zion,  MD [facility] 02/24/2018, 10:45 AM  If 7PM-7AM, please contact night-coverage [email or web address]

## 2018-02-24 NOTE — Evaluation (Signed)
Physical Therapy Evaluation Patient Details Name: Jorge DroughtGeorge O [name] MRN: [id number] DOB: October 12, 1945 Today's Date: 02/24/2018   History of Present Illness  Pt is a 72 y.o. male with PMH consisitng of atrial fibrillation, CKD, CHF, HTN, hyperlipidemia, and dyspnea presents to the ED with 2 recent falls over  the past several days. Pt also complains of lightheadedness, headache, and weakness. Chest x-ray on 02/22/2018 showed CHF with mild interstitial edema and R basilar atelectasis or pneumonia.    Clinical Impression  Pt presents supine, HOB elevated, and alert. Pt states willingness to participate in PT. Prior to admission, pt states independence with all ADL's and functional mobility. Pt now planning on returning home with ex-wife and 12 steps to enter the house. Pt is overall max A x2 during bed mobility. Pt demonstrates increased lethargy during session, occasionally falling asleep throughout. Pt reports increased lightheadedness and dizziness during supine to sit that did not resolve, and requested to lay back down. Pt O2 sats on RA was 81%. Supplemental 2L O2 reapplied, and O2 sats returned to 93%. BP recorded in supine at 120/87 mmHg. Current recommendation of SNF due to increased functional mobility assistance required. Pt would benefit from skilled PT in order to increase functional endurance, mobility, and strength.     Follow Up Recommendations SNF    Equipment Recommendations  None recommended by PT    Recommendations for Other Services       Precautions / Restrictions Precautions Precautions: None Restrictions Weight Bearing Restrictions: No      Mobility  Bed Mobility Overal bed mobility: Needs Assistance Bed Mobility: Rolling;Supine to Sit;Sit to Supine Rolling: Max assist;+2 for physical assistance   Supine to sit: Max assist;+2 for physical assistance Sit to supine: Max assist;+2 for physical assistance   General bed mobility comments: Pt requires BLE and trunk  stability support throughout bed mobility. Pt reports increase in lightheadedness during transitional movements.   Transfers Overall transfer level: (deferred )                  Ambulation/Gait                Stairs            Wheelchair Mobility    Modified Rankin (Stroke Patients Only)       Balance Overall balance assessment: Needs assistance Sitting-balance support: Feet unsupported;Bilateral upper extremity supported Sitting balance-Leahy Scale: Poor Sitting balance - Comments: Pt required BUE support and min A throughout sitting balance to maintain stability. Pt reported increased feeling of lightheadedness and was place in supine. O2 sat on room air was 81%. After 2 L O2, O2 sats back up to 93%. BP in supine also seen at 120/87 mmHg.                                      Pertinent Vitals/Pain Pain Assessment: Faces Faces Pain Scale: Hurts a little bit Pain Location: back Pain Descriptors / Indicators: Aching Pain Intervention(s): Monitored during session    Home Living Family/patient expects to be discharged to:: Private residence Living Arrangements: Spouse/significant other Available Help at Discharge: Family;Available PRN/intermittently Type of Home: House Home Access: Stairs to enter Entrance Stairs-Rails: (unsure ) Entrance Stairs-Number of Steps: 12 Home Layout: Two level Home Equipment: Walker - 2 wheels;Cane - single point      Prior Function Level of Independence: Independent with assistive device(s)  Comments: States no AD use currently. Recently using RW     Hand Dominance   Dominant Hand: Left    Extremity/Trunk Assessment   Upper Extremity Assessment Upper Extremity Assessment: Defer to OT evaluation    Lower Extremity Assessment Lower Extremity Assessment: Generalized weakness       Communication   Communication: No difficulties  Cognition Arousal/Alertness: Lethargic Behavior During  Therapy: Anxious Overall Cognitive Status: Impaired/Different from baseline Area of Impairment: Attention;Following commands;Awareness;Problem solving                   Current Attention Level: Sustained   Following Commands: Follows one step commands with increased time;Follows one step commands consistently;Follows multi-step commands inconsistently   Awareness: Emergent Problem Solving: Slow processing;Decreased initiation;Difficulty sequencing;Requires verbal cues;Requires tactile cues General Comments: Pt lethargic throughout. Would occasionally drift off to sleep.       General Comments      Exercises     Assessment/Plan    PT Assessment Patient needs continued PT services  PT Problem List Decreased range of motion;Decreased strength;Decreased activity tolerance;Decreased coordination;Decreased mobility;Decreased balance;Decreased cognition;Decreased knowledge of use of DME;Cardiopulmonary status limiting activity       PT Treatment Interventions DME instruction;Gait training;Stair training;Therapeutic activities;Functional mobility training;Therapeutic exercise;Balance training;Neuromuscular re-education    PT Goals (Current goals can be found in the Care Plan section)  Acute Rehab PT Goals Patient Stated Goal: To go home and feel better  PT Goal Formulation: With patient Time For Goal Achievement: 03/10/18 Potential to Achieve Goals: Fair    Frequency Min 3X/week   Barriers to discharge        Co-evaluation               AM-PAC PT "6 Clicks" Daily Activity  Outcome Measure Difficulty turning over in bed (including adjusting bedclothes, sheets and blankets)?: Unable Difficulty moving from lying on back to sitting on the side of the bed? : Unable Difficulty sitting down on and standing up from a chair with arms (e.g., wheelchair, bedside commode, etc,.)?: Unable Help needed moving to and from a bed to chair (including a wheelchair)?: Total Help  needed walking in hospital room?: Total Help needed climbing 3-5 steps with a railing? : Total 6 Click Score: 6    End of Session   Activity Tolerance: Patient limited by lethargy;Patient limited by fatigue Patient left: in bed;with call bell/phone within reach Nurse Communication: Mobility status;Other (comment)(Bed alarm malfunction  ) PT Visit Diagnosis: Difficulty in walking, not elsewhere classified (R26.2);Unsteadiness on feet (R26.81)    Time: 1440-1505 PT Time Calculation (min) (ACUTE ONLY): 25 min   Charges:   PT Evaluation $PT Eval Moderate Complexity: 1 Mod PT Treatments $Therapeutic Activity: 8-22 mins        Rolm Bookbinder, SPT Acute Rehab [phone] (pager) [phone] (office)   Yaniv Lage 02/24/2018, 5:06 PM

## 2018-02-24 NOTE — Care Management Important Message (Signed)
Important Message  Patient Details  Name: Jorge [name] MRN: [id number] Date of Birth: [birth year]   Medicare Important Message Given:  Yes    Jade Burright Stefan ChurchBratton 02/24/2018, 3:51 PM

## 2018-02-24 NOTE — Progress Notes (Signed)
Pt found to have dried blood in nares while getting pt off of bed pan. Pt stated that his nose is usually dry, but it doesn't bleed. Pt blew a significant sized blood clot into wet washcloths. The washcloth was left at the bedside sink so that MD can see. MD is made aware. Pt on 3L [place]. Respiratory was called to set up humidified O2. Will continue to monitor.

## 2018-02-25 LAB — CBC
HCT: 33.9 % — ABNORMAL LOW (ref 39.0–52.0)
Hemoglobin: 11 g/dL — ABNORMAL LOW (ref 13.0–17.0)
MCH: 30.1 pg (ref 26.0–34.0)
MCHC: 32.4 g/dL (ref 30.0–36.0)
MCV: 92.9 fL (ref 80.0–100.0)
PLATELETS: 168 10*3/uL (ref 150–400)
RBC: 3.65 MIL/uL — ABNORMAL LOW (ref 4.22–5.81)
RDW: 14.4 % (ref 11.5–15.5)
WBC: 17 10*3/uL — ABNORMAL HIGH (ref 4.0–10.5)
nRBC: 0 % (ref 0.0–0.2)

## 2018-02-25 LAB — TROPONIN I
TROPONIN I: 0.04 ng/mL — AB (ref <0.03)
Troponin I: 0.05 ng/mL (ref <0.03)

## 2018-02-25 LAB — BASIC METABOLIC PANEL
Anion gap: 11 (ref 5–15)
BUN: 69 mg/dL — AB (ref 8–23)
CO2: 19 mmol/L — ABNORMAL LOW (ref 22–32)
CREATININE: 4.32 mg/dL — AB (ref 0.61–1.24)
Calcium: 7.6 mg/dL — ABNORMAL LOW (ref 8.9–10.3)
Chloride: 107 mmol/L (ref 98–111)
GFR calc Af Amer: 14 mL/min — ABNORMAL LOW (ref >60)
GFR, EST NON AFRICAN AMERICAN: 12 mL/min — AB (ref >60)
GLUCOSE: 121 mg/dL — AB (ref 70–99)
Potassium: 4.5 mmol/L (ref 3.5–5.1)
SODIUM: 137 mmol/L (ref 135–145)

## 2018-02-25 LAB — PROTIME-INR
INR: 3.09
Prothrombin Time: 31.4 seconds — ABNORMAL HIGH (ref 11.4–15.2)

## 2018-02-25 MED ORDER — WARFARIN SODIUM 5 MG PO TABS
5.0000 mg | ORAL_TABLET | Freq: Once | ORAL | Status: AC
  Administered 2018-02-25: 5 mg via ORAL
  Filled 2018-02-25: qty 1

## 2018-02-25 MED ORDER — CYANOCOBALAMIN 1000 MCG/ML IJ SOLN
1000.0000 ug | Freq: Every day | INTRAMUSCULAR | Status: AC
  Administered 2018-02-25 – 2018-02-27 (×3): 1000 ug via INTRAMUSCULAR
  Filled 2018-02-25 (×3): qty 1

## 2018-02-25 NOTE — Progress Notes (Signed)
CRITICAL VALUE ALERT  Critical Value:  Troponin 0.05  Date & Time Notied:  02/25/18 @ 1800  Provider Notified: Dr. Randol KernElgergawy  Orders Received/Actions taken: will repeat trop. and continue to monitor for chest pain.

## 2018-02-25 NOTE — Progress Notes (Signed)
CRITICAL VALUE ALERT  Critical Value:  Troponin 0.04  Date & Time Notied:  02/25/18 @ 1434  Provider Notified: Edgardo RoysGreta, RN text paged Dr. Wynona LunaElgeraway  Orders Received/Actions taken: Will repeat in 4 hours.  Espen Bethel Laural BenesJohnson, RN/ [place], RN

## 2018-02-25 NOTE — Progress Notes (Signed)
CSW received consult regarding PT recommendation of SNF at discharge.  CSW met with patient and his ex-wife at bedside. CSW explained that since patient had recently discharged from SNF and has not had his Medicare days start over, he only has 8 days of SNF left at 100%. Patient is refusing SNF and his ex-wife states she will fill in where home health cannot. Will consult RNCM for home health servces.  CSW signing off.   Percell Locus Kayla Deshaies LCSW [phone]

## 2018-02-25 NOTE — Progress Notes (Signed)
PROGRESS NOTE    Jorge [name]  ZOX:[id number] DOB: 27-Jan-1946 DOA: 02/21/2018 PCP: Lonie Peakonroy, Nathan, PA-C   Brief Narrative:  Jorge [name] is a 72 y.o. male with a history of TAVR, PVD, chronic kidney disease stage III, paroxysmal A. fib, OSA, GERD, HTN, chronic diastolic CHF. Patient presented secondary to cough and dyspnea, found to have E. Coli bacteremia with likely urinary source. Patient transferred to ICU for hypotension/septic shock. On antibiotics, now narrowed to cefazolin for pan-sensitive e. Coli.   work-up significant for acute renal failure reasoning peaked at 5.4  Subjective: he does report some chest pain, reproducible overnight .  Assessment & Plan:   Active Problems:   Aortic stenosis   CAD (coronary artery disease)   HLD (hyperlipidemia)   Essential hypertension   Paroxysmal atrial fibrillation (HCC)   Abdominal pain   History of cerebrovascular accident (CVA) with residual deficit   BPH (benign prostatic hyperplasia)   Obesity, Class III, BMI 40-49.9 (morbid obesity) (HCC)   Chronic intermittent hypoxia with obstructive sleep apnea   OSA on CPAP   Acute on chronic diastolic congestive heart failure (HCC)   Anticoagulated on warfarin   Severe sepsis (HCC)   AKI (acute kidney injury) (HCC)   CKD (chronic kidney disease) stage 3, GFR 30-59 ml/min (HCC)   Severe sepsis secondary to E. Coli bacteremia/Acute pyelonephritis -Sepsis present  on admission, hypotensive, elevated lactic acid, renal failure, leukocytosis -Blood cultures/urine culture growing pansensitive E. coli, currently on cefazolin, was empirically on vancomycin/cefepime. -Continue with IV antibiotics during hospital stay, will transition to oral regimen on discharge. -Leukocytosis trending down  Acute kidney injury on CKD stage 3 -Plan creatinine is 1, peaked at 5.2 during hospital stay, most likely in the setting of ATN hypotension/septic shock , continue with IV fluids, avoid  nephrotoxic medication . -Old catheter discontinued yesterday, no evidence of retention, continue to monitor output closely . -Continue trending down, it is 4.3 today  Hyperkalemia In setting of kidney injury.  Resolved  Chronic diastolic heart failure Euvolemic. Grade 1 diastolic dysfunction with a normal EF. Monitor volume status as started on IV fluids  Paroxysmal atrial fibrillation Rate controlled -Continue amiodarone and warfarin  CAD -Continue aspirin and Lipitor -Does report some chest pain today, appears to be reproducible by palpation, I will obtain EKG and troponin  Hypothyroidism -Continue Synthroid  Anemia of chronic disease -Anemia panel significant for anemia of chronic disease, and borderline B12, will be repeating  History of CVA, nonhemorrhagic -No new focal deficits, continue with warfarin  Morbid obesity Body mass index is 42.82 kg/m.  -----     DVT prophylaxis: Coumadin Code Status:   Code Status: Full Code Family Communication: None at bedside Disposition Plan: Transfer to medical floor   Consultants:   Critical care medicine  Procedures:   None  Antimicrobials:  Vancomycin (11/16)  Cefepime (11/16>>11/17)  Ceftriaxone (11/18)  Cefazolin (11/19>>      Objective: Vitals:   02/24/18 1605 02/24/18 1700 02/24/18 2108 02/25/18 0600  BP: (!) 125/108 135/78 (!) 118/92 128/83  Pulse: (!) 101  96 82  Resp: 18  19 18   Temp: 98.5 F (36.9 C)  97.7 F (36.5 C) 97.7 F (36.5 C)  TempSrc:   Oral Oral  SpO2: 94%  95% 93%  Weight:      Height:        Intake/Output Summary (Last 24 hours) at 02/25/2018 1226 Last data filed at 02/25/2018 1220 Gross per 24 hour  Intake 490 ml  Output 1000 ml  Net -510 ml   Filed Weights   02/21/18 0330  Weight: (!) 143.2 kg    Examination:  Awake Alert, Oriented X 3, No new F.N deficits, Normal affect Symmetrical Chest wall movement, Good air movement bilaterally, CTAB Irregularly  irregular,No Gallops,Rubs or new Murmurs, No Parasternal Heave, has reproducible chest pain on palpation +ve B.Sounds, Abd Soft, No tenderness, No rebound - guarding or rigidity. No Cyanosis, Clubbing or edema, No new Rash or bruise       Data Reviewed: I have personally reviewed following labs and imaging studies  CBC: Recent Labs  Lab 02/21/18 0317 02/22/18 0403 02/23/18 0259 02/24/18 0614 02/25/18 0311  WBC 21.8* 19.4* 18.7* 15.2* 17.0*  NEUTROABS 18.8*  --   --   --   --   HGB 13.4 10.9* 10.8* 10.7* 11.0*  HCT 42.8 34.8* 34.6* 32.4* 33.9*  MCV 96.2 95.1 94.8 93.1 92.9  PLT 217 152 155 160 168   Basic Metabolic Panel: Recent Labs  Lab 02/21/18 1618 02/22/18 0403 02/23/18 0259 02/24/18 0614 02/25/18 0311  NA 133* 131* 134* 137 137  K 4.6 4.0 5.3* 4.5 4.5  CL 104 100 100 104 107  CO2 19* 20* 22 22 19*  GLUCOSE 141* 157* 141* 116* 121*  BUN 54* 58* 66* 69* 69*  CREATININE 4.68* 5.03* 5.24* 4.86* 4.32*  CALCIUM 6.7* 6.7* 7.5* 7.8* 7.6*  MG  --   --  2.5*  --   --    GFR: Estimated Creatinine Clearance: 22.7 mL/min (A) (by C-G formula based on SCr of 4.32 mg/dL (H)). Liver Function Tests: Recent Labs  Lab 02/21/18 0317 02/22/18 0403 02/23/18 0259  AST 31 31 25   ALT 27 25 25   ALKPHOS 132* 98 113  BILITOT 1.1 0.8 0.7  PROT 6.6 5.6* 6.2*  ALBUMIN 3.0* 2.1* 2.1*   No results for input(s): LIPASE, AMYLASE in the last 168 hours. No results for input(s): AMMONIA in the last 168 hours. Coagulation Profile: Recent Labs  Lab 02/22/18 0403 02/22/18 1128 02/23/18 0259 02/24/18 0614 02/25/18 0311  INR 5.03* 5.78* 4.67* 3.53 3.09   Cardiac Enzymes: No results for input(s): CKTOTAL, CKMB, CKMBINDEX, TROPONINI in the last 168 hours. BNP (last 3 results) No results for input(s): PROBNP in the last 8760 hours. HbA1C: No results for input(s): HGBA1C in the last 72 hours. CBG: Recent Labs  Lab 02/21/18 1204  GLUCAP 101*   Lipid Profile: No results for  input(s): CHOL, HDL, LDLCALC, TRIG, CHOLHDL, LDLDIRECT in the last 72 hours. Thyroid Function Tests: No results for input(s): TSH, T4TOTAL, FREET4, T3FREE, THYROIDAB in the last 72 hours. Anemia Panel: Recent Labs    02/24/18 1114  VITAMINB12 333  FOLATE 9.5  FERRITIN 565*  TIBC 186*  IRON 25*  RETICCTPCT 0.5   Sepsis Labs: Recent Labs  Lab 02/21/18 0318 02/21/18 0531  LATICACIDVEN 2.53* 1.81    Recent Results (from the past 240 hour(s))  Blood culture (routine x 2)     Status: Abnormal   Collection Time: 02/21/18  3:06 AM  Result Value Ref Range Status   Specimen Description BLOOD LEFT HAND  Final   Special Requests   Final    BOTTLES DRAWN AEROBIC AND ANAEROBIC Blood Culture adequate volume   Culture  Setup Time   Final    GRAM NEGATIVE RODS IN BOTH AEROBIC AND ANAEROBIC BOTTLES CRITICAL RESULT CALLED TO, READ BACK BY AND VERIFIED WITH: PHARMD K PIRCE [phone] BY GF Performed at  [facility], [address] [place], [place] [address]    Culture ESCHERICHIA COLI (A)  Final   Report Status 02/23/2018 FINAL  Final   Organism ID, Bacteria ESCHERICHIA COLI  Final      Susceptibility   Escherichia coli - MIC*    AMPICILLIN <=2 SENSITIVE Sensitive     CEFAZOLIN <=4 SENSITIVE Sensitive     CEFEPIME <=1 SENSITIVE Sensitive     CEFTAZIDIME <=1 SENSITIVE Sensitive     CEFTRIAXONE <=1 SENSITIVE Sensitive     CIPROFLOXACIN <=0.25 SENSITIVE Sensitive     GENTAMICIN <=1 SENSITIVE Sensitive     IMIPENEM <=0.25 SENSITIVE Sensitive     TRIMETH/SULFA <=20 SENSITIVE Sensitive     AMPICILLIN/SULBACTAM <=2 SENSITIVE Sensitive     PIP/TAZO <=4 SENSITIVE Sensitive     Extended ESBL NEGATIVE Sensitive     * ESCHERICHIA COLI  Blood Culture ID Panel (Reflexed)     Status: Abnormal   Collection Time: 02/21/18  3:06 AM  Result Value Ref Range Status   Enterococcus species NOT DETECTED NOT DETECTED Final   Listeria monocytogenes NOT DETECTED NOT DETECTED Final   Staphylococcus  species NOT DETECTED NOT DETECTED Final   Staphylococcus aureus (BCID) NOT DETECTED NOT DETECTED Final   Streptococcus species NOT DETECTED NOT DETECTED Final   Streptococcus agalactiae NOT DETECTED NOT DETECTED Final   Streptococcus pneumoniae NOT DETECTED NOT DETECTED Final   Streptococcus pyogenes NOT DETECTED NOT DETECTED Final   Acinetobacter baumannii NOT DETECTED NOT DETECTED Final   Enterobacteriaceae species DETECTED (A) NOT DETECTED Final    Comment: Enterobacteriaceae represent a large family of gram-negative bacteria, not a single organism. CRITICAL RESULT CALLED TO, READ BACK BY AND VERIFIED WITH: PHARMD K PIRCE [phone] BY GF    Enterobacter cloacae complex NOT DETECTED NOT DETECTED Final   Escherichia coli DETECTED (A) NOT DETECTED Final    Comment: PHARMD K PIRCE [phone] BY GF   Klebsiella oxytoca NOT DETECTED NOT DETECTED Final   Klebsiella pneumoniae NOT DETECTED NOT DETECTED Final   Proteus species NOT DETECTED NOT DETECTED Final   Serratia marcescens NOT DETECTED NOT DETECTED Final   Carbapenem resistance NOT DETECTED NOT DETECTED Final   Haemophilus influenzae NOT DETECTED NOT DETECTED Final   Neisseria meningitidis NOT DETECTED NOT DETECTED Final   Pseudomonas aeruginosa NOT DETECTED NOT DETECTED Final   Candida albicans NOT DETECTED NOT DETECTED Final   Candida glabrata NOT DETECTED NOT DETECTED Final   Candida krusei NOT DETECTED NOT DETECTED Final   Candida parapsilosis NOT DETECTED NOT DETECTED Final   Candida tropicalis NOT DETECTED NOT DETECTED Final    Comment: Performed at [facility], [address] [place], [place] [address]  Blood culture (routine x 2)     Status: None (Preliminary result)   Collection Time: 02/21/18  5:20 AM  Result Value Ref Range Status   Specimen Description BLOOD RIGHT HAND  Final   Special Requests   Final    BOTTLES DRAWN AEROBIC AND ANAEROBIC Blood Culture adequate volume   Culture   Final    NO GROWTH 3  DAYS Performed at [facility], [address], [place], [place] [address]    Report Status PENDING  Incomplete  Urine culture     Status: Abnormal   Collection Time: 02/21/18  6:15 AM  Result Value Ref Range Status   Specimen Description URINE, CATHETERIZED  Final   Special Requests   Final    NONE  Performed at [facility], [address], [place], [place] [address]    Culture 40,000 COLONIES/mL ESCHERICHIA COLI (A)  Final   Report Status 02/24/2018 FINAL  Final   Organism ID, Bacteria ESCHERICHIA COLI (A)  Final      Susceptibility   Escherichia coli - MIC*    AMPICILLIN <=2 SENSITIVE Sensitive     CEFAZOLIN <=4 SENSITIVE Sensitive     CEFTRIAXONE <=1 SENSITIVE Sensitive     CIPROFLOXACIN <=0.25 SENSITIVE Sensitive     GENTAMICIN <=1 SENSITIVE Sensitive     IMIPENEM <=0.25 SENSITIVE Sensitive     NITROFURANTOIN <=16 SENSITIVE Sensitive     TRIMETH/SULFA <=20 SENSITIVE Sensitive     AMPICILLIN/SULBACTAM <=2 SENSITIVE Sensitive     PIP/TAZO <=4 SENSITIVE Sensitive     Extended ESBL NEGATIVE Sensitive     * 40,000 COLONIES/mL ESCHERICHIA COLI  MRSA PCR Screening     Status: None   Collection Time: 02/21/18  8:53 AM  Result Value Ref Range Status   MRSA by PCR NEGATIVE NEGATIVE Final    Comment:        The GeneXpert MRSA Assay (FDA approved for NASAL specimens only), is one component of a comprehensive MRSA colonization surveillance program. It is not intended to diagnose MRSA infection nor to guide or monitor treatment for MRSA infections. Performed at [facility], [address], [place], [place] [address]          Radiology Studies: No results found.      Scheduled Meds: . sodium chloride   Intravenous Once  . amiodarone  200 mg Oral Daily  . aspirin EC  81 mg Oral Daily  . atorvastatin  40 mg Oral q1800  . benzonatate  100 mg Oral TID  . DULoxetine  60 mg Oral q1800  . guaiFENesin  1,200 mg Oral BID  . levothyroxine  112 mcg Oral  Q0600  . Melatonin  3 mg Oral QHS  . pantoprazole  40 mg Oral Q1200  . polyethylene glycol  17 g Oral Daily  . tamsulosin  0.4 mg Oral QPC supper  . warfarin  5 mg Oral ONCE-1800  . Warfarin - Pharmacist Dosing Inpatient   Does not apply q1800   Continuous Infusions: . sodium chloride 50 mL/hr at 02/25/18 0648  .  ceFAZolin (ANCEF) IV 1 g (02/25/18 0954)     LOS: 4 days     Huey Bienenstock, MD [facility] 02/25/2018, 12:26 PM  If 7PM-7AM, please contact night-coverage [email or web address]

## 2018-02-25 NOTE — Progress Notes (Addendum)
ANTICOAGULATION CONSULT NOTE - Follow Up Consult  Pharmacy Consult for warfarin Indication: atrial fibrillation and history of CVA  No Known Allergies  Patient Measurements: Height: 6' (182.9 cm) Weight: (!) 315 lb 11.2 oz (143.2 kg) IBW/kg (Calculated) : 77.6  Vital Signs: Temp: 97.7 F (36.5 C) (11/21 0600) Temp Source: Oral (11/21 0600) BP: 128/83 (11/21 0600) Pulse Rate: 82 (11/21 0600)  Labs: Recent Labs    02/23/18 0259 02/24/18 0614 02/25/18 0311  HGB 10.8* 10.7* 11.0*  HCT 34.6* 32.4* 33.9*  PLT 155 160 168  LABPROT 43.3* 34.8* 31.4*  INR 4.67* 3.53 3.09  CREATININE 5.24* 4.86* 4.32*    Estimated Creatinine Clearance: 22.7 mL/min (A) (by C-G formula based on SCr of 4.32 mg/dL (H)).  Assessment: Pt's was on warfarin PTA 7.5mg  daily for afib & hx of CVA. Pt's INR was therapeutic on admission, but became elevated. Warfarin has been on hold with LD 11/16. Pt was given Vit K 5mg  x1 on 11/18 and experienced nose bleed 11/20 AM with no subsequent bleeding noted. Hgb 11.0 (low/stable) INR elevated at 3.09  Goal of Therapy:  INR 2-3 Monitor platelets by anticoagulation protocol: Yes   Plan:   Warfarin 7.5mg  x1  (see new plan below) Daily INR Monitor for s/s bleeding  Wess BottsEmma  Stinson 02/25/2018,11:48 AM    I discussed / reviewed the pharmacy note by Wess BottsEmma Stinson, Pharm D Student.   I  have  reviewed the student's findings. INR down to 3.09, just slightly above therapeutic after warfarin held x 4 days and post Vitamin K, INR trending down.  We will give  Warfarin 5 mg po today x1.  Noah Delaineuth Rykar Lebleu, RPh Clinical Pharmacist [phone] Please check AMION for all [facility] phone numbers After 10:00 PM, call Main Pharmacy [phone] 02/25/2018 12:26 PM

## 2018-02-26 LAB — CULTURE, BLOOD (ROUTINE X 2)
Culture: NO GROWTH
SPECIAL REQUESTS: ADEQUATE

## 2018-02-26 LAB — BASIC METABOLIC PANEL
ANION GAP: 11 (ref 5–15)
BUN: 63 mg/dL — ABNORMAL HIGH (ref 8–23)
CALCIUM: 8 mg/dL — AB (ref 8.9–10.3)
CO2: 21 mmol/L — AB (ref 22–32)
CREATININE: 3.72 mg/dL — AB (ref 0.61–1.24)
Chloride: 108 mmol/L (ref 98–111)
GFR, EST AFRICAN AMERICAN: 17 mL/min — AB (ref >60)
GFR, EST NON AFRICAN AMERICAN: 15 mL/min — AB (ref >60)
Glucose, Bld: 118 mg/dL — ABNORMAL HIGH (ref 70–99)
Potassium: 4.2 mmol/L (ref 3.5–5.1)
SODIUM: 140 mmol/L (ref 135–145)

## 2018-02-26 LAB — PROTIME-INR
INR: 2.87
PROTHROMBIN TIME: 29.6 s — AB (ref 11.4–15.2)

## 2018-02-26 LAB — CBC
HEMATOCRIT: 36.1 % — AB (ref 39.0–52.0)
Hemoglobin: 11.8 g/dL — ABNORMAL LOW (ref 13.0–17.0)
MCH: 30.4 pg (ref 26.0–34.0)
MCHC: 32.7 g/dL (ref 30.0–36.0)
MCV: 93 fL (ref 80.0–100.0)
NRBC: 0 % (ref 0.0–0.2)
PLATELETS: 196 10*3/uL (ref 150–400)
RBC: 3.88 MIL/uL — ABNORMAL LOW (ref 4.22–5.81)
RDW: 14.8 % (ref 11.5–15.5)
WBC: 15.9 10*3/uL — AB (ref 4.0–10.5)

## 2018-02-26 LAB — TROPONIN I: Troponin I: <0.03 ng/mL (ref <0.03)

## 2018-02-26 MED ORDER — METOPROLOL TARTRATE 25 MG PO TABS
25.0000 mg | ORAL_TABLET | Freq: Two times a day (BID) | ORAL | Status: DC
  Administered 2018-02-26 – 2018-02-27 (×3): 25 mg via ORAL
  Filled 2018-02-26 (×3): qty 1

## 2018-02-26 MED ORDER — WARFARIN SODIUM 7.5 MG PO TABS
7.5000 mg | ORAL_TABLET | Freq: Once | ORAL | Status: AC
  Administered 2018-02-26: 7.5 mg via ORAL
  Filled 2018-02-26: qty 1

## 2018-02-26 NOTE — Progress Notes (Signed)
MD was informed about patient having moderate amount of sputum with blood in it. Patient on Coumadin. Will continue to monitor.

## 2018-02-26 NOTE — Progress Notes (Signed)
Patient was helped by staff out of bed in a chair. No acute distress noted, no complaints. Will continue to monitor.

## 2018-02-26 NOTE — Progress Notes (Signed)
PROGRESS NOTE    Jorge [name]  UEA:[id number] DOB: October 28, 1945 DOA: 02/21/2018 PCP: [name], PA-C   Brief Narrative:  Jorge [name] is a 72 y.o. male with a history of TAVR, PVD, chronic kidney disease stage III, paroxysmal A. fib, OSA, GERD, HTN, chronic diastolic CHF. Patient presented secondary to cough and dyspnea, found to have E. Coli bacteremia with likely urinary source. Patient transferred to ICU for hypotension/septic shock. On antibiotics, now narrowed to cefazolin for pan-sensitive e. Coli.   work-up significant for acute renal failure, peaked at 5.4, Improving.  Subjective: No dyspnea, no chest pain, does report cough, productive with minimal amount of blood-tinged sputum.  Assessment & Plan:   Active Problems:   Aortic stenosis   CAD (coronary artery disease)   HLD (hyperlipidemia)   Essential hypertension   Paroxysmal atrial fibrillation (HCC)   Abdominal pain   History of cerebrovascular accident (CVA) with residual deficit   BPH (benign prostatic hyperplasia)   Obesity, Class III, BMI 40-49.9 (morbid obesity) (HCC)   Chronic intermittent hypoxia with obstructive sleep apnea   OSA on CPAP   Acute on chronic diastolic congestive heart failure (HCC)   Anticoagulated on warfarin   Severe sepsis (HCC)   AKI (acute kidney injury) (HCC)   CKD (chronic kidney disease) stage 3, GFR 30-59 ml/min (HCC)   Severe sepsis secondary to E. Coli bacteremia/Acute pyelonephritis -Sepsis present  on admission, hypotensive, elevated lactic acid, renal failure, leukocytosis -Blood cultures/urine culture growing pansensitive E. coli, currently on cefazolin, was empirically on vancomycin/cefepime. -Continue with IV antibiotics during hospital stay, will transition to oral regimen on discharge. -Leukocytosis trending down  Acute kidney injury on CKD stage 3 -Baseline creatinine is 1, peaked at 5.2 during hospital stay, this is most likely in the setting of ATN due to  sepsis/hypotension , it is improving with gentle hydration, it is 3.7 today, will continue with current measures, avoid nephrotoxic medications .  Hyperkalemia In setting of kidney injury.  Resolved  Chronic diastolic heart failure Euvolemic. Grade 1 diastolic dysfunction with a normal EF. Monitor volume status as started on IV fluids  Paroxysmal atrial fibrillation Rate controlled -Continue amiodarone and warfarin, INR is therapeutic is within normal range  CAD -Continue aspirin and Lipitor -Patient with nontypical/reproducible chest pain 02/25/2018, EKG nonacute, troponins non-ACS pattern.  Hypothyroidism -Continue Synthroid  Anemia of chronic disease -Anemia panel significant for anemia of chronic disease, and borderline B12, will be repeating  History of CVA, nonhemorrhagic -No new focal deficits, continue with warfarin  She did report small amount of blood-tinged sputum, and warfarin and aspirin, looked at his serum, very minimal amount of blood-tinged sputum, will continue to monitor.  Morbid obesity Body mass index is 42.82 kg/m.  -----     DVT prophylaxis: Coumadin Code Status:   Code Status: Full Code Family Communication: None at bedside Disposition Plan: Home renal function is stable   Consultants:   Critical care medicine  Procedures:   None  Antimicrobials:  Vancomycin (11/16)  Cefepime (11/16>>11/17)  Ceftriaxone (11/18)  Cefazolin (11/19>>      Objective: Vitals:   02/25/18 1402 02/25/18 2050 02/26/18 0417 02/26/18 1351  BP: 119/60 (!) 171/97 (!) 165/89 138/68  Pulse: 92 95 82 88  Resp: 18 18 18    Temp: 98.3 F (36.8 C) 98.5 F (36.9 C) 99.7 F (37.6 C) 98.2 F (36.8 C)  TempSrc: Oral     SpO2: 97% 90% 97% 96%  Weight:      Height:  Intake/Output Summary (Last 24 hours) at 02/26/2018 1533 Last data filed at 02/26/2018 1419 Gross per 24 hour  Intake 1308 ml  Output 2175 ml  Net -867 ml   Filed Weights    02/21/18 0330  Weight: (!) 143.2 kg    Examination:  Awake Alert, Oriented X 3, No new F.N deficits, Normal affect Symmetrical Chest wall movement, Good air movement bilaterally, CTAB Irregular irregular,No Gallops,Rubs or new Murmurs, No Parasternal Heave +ve B.Sounds, Abd Soft, No tenderness, No rebound - guarding or rigidity. No Cyanosis, Clubbing or edema, No new Rash or bruise        Data Reviewed: I have personally reviewed following labs and imaging studies  CBC: Recent Labs  Lab 02/21/18 0317 02/22/18 0403 02/23/18 0259 02/24/18 0614 02/25/18 0311 02/26/18 0337  WBC 21.8* 19.4* 18.7* 15.2* 17.0* 15.9*  NEUTROABS 18.8*  --   --   --   --   --   HGB 13.4 10.9* 10.8* 10.7* 11.0* 11.8*  HCT 42.8 34.8* 34.6* 32.4* 33.9* 36.1*  MCV 96.2 95.1 94.8 93.1 92.9 93.0  PLT 217 152 155 160 168 196   Basic Metabolic Panel: Recent Labs  Lab 02/22/18 0403 02/23/18 0259 02/24/18 0614 02/25/18 0311 02/26/18 0337  NA 131* 134* 137 137 140  K 4.0 5.3* 4.5 4.5 4.2  CL 100 100 104 107 108  CO2 20* 22 22 19* 21*  GLUCOSE 157* 141* 116* 121* 118*  BUN 58* 66* 69* 69* 63*  CREATININE 5.03* 5.24* 4.86* 4.32* 3.72*  CALCIUM 6.7* 7.5* 7.8* 7.6* 8.0*  MG  --  2.5*  --   --   --    GFR: Estimated Creatinine Clearance: 26.4 mL/min (A) (by C-G formula based on SCr of 3.72 mg/dL (H)). Liver Function Tests: Recent Labs  Lab 02/21/18 0317 02/22/18 0403 02/23/18 0259  AST 31 31 25   ALT 27 25 25   ALKPHOS 132* 98 113  BILITOT 1.1 0.8 0.7  PROT 6.6 5.6* 6.2*  ALBUMIN 3.0* 2.1* 2.1*   No results for input(s): LIPASE, AMYLASE in the last 168 hours. No results for input(s): AMMONIA in the last 168 hours. Coagulation Profile: Recent Labs  Lab 02/22/18 1128 02/23/18 0259 02/24/18 0614 02/25/18 0311 02/26/18 0337  INR 5.78* 4.67* 3.53 3.09 2.87   Cardiac Enzymes: Recent Labs  Lab 02/25/18 1229 02/25/18 1612 02/26/18 0709  TROPONINI 0.04* 0.05* <0.03   BNP (last 3  results) No results for input(s): PROBNP in the last 8760 hours. HbA1C: No results for input(s): HGBA1C in the last 72 hours. CBG: Recent Labs  Lab 02/21/18 1204  GLUCAP 101*   Lipid Profile: No results for input(s): CHOL, HDL, LDLCALC, TRIG, CHOLHDL, LDLDIRECT in the last 72 hours. Thyroid Function Tests: No results for input(s): TSH, T4TOTAL, FREET4, T3FREE, THYROIDAB in the last 72 hours. Anemia Panel: Recent Labs    02/24/18 1114  VITAMINB12 333  FOLATE 9.5  FERRITIN 565*  TIBC 186*  IRON 25*  RETICCTPCT 0.5   Sepsis Labs: Recent Labs  Lab 02/21/18 0318 02/21/18 0531  LATICACIDVEN 2.53* 1.81    Recent Results (from the past 240 hour(s))  Blood culture (routine x 2)     Status: Abnormal   Collection Time: 02/21/18  3:06 AM  Result Value Ref Range Status   Specimen Description BLOOD LEFT HAND  Final   Special Requests   Final    BOTTLES DRAWN AEROBIC AND ANAEROBIC Blood Culture adequate volume   Culture  Setup Time  Final    GRAM NEGATIVE RODS IN BOTH AEROBIC AND ANAEROBIC BOTTLES CRITICAL RESULT CALLED TO, READ BACK BY AND VERIFIED WITH: PHARMD K PIRCE [phone] BY GF Performed at [facility], [address] [place], [place] [address]    Culture ESCHERICHIA COLI (A)  Final   Report Status 02/23/2018 FINAL  Final   Organism ID, Bacteria ESCHERICHIA COLI  Final      Susceptibility   Escherichia coli - MIC*    AMPICILLIN <=2 SENSITIVE Sensitive     CEFAZOLIN <=4 SENSITIVE Sensitive     CEFEPIME <=1 SENSITIVE Sensitive     CEFTAZIDIME <=1 SENSITIVE Sensitive     CEFTRIAXONE <=1 SENSITIVE Sensitive     CIPROFLOXACIN <=0.25 SENSITIVE Sensitive     GENTAMICIN <=1 SENSITIVE Sensitive     IMIPENEM <=0.25 SENSITIVE Sensitive     TRIMETH/SULFA <=20 SENSITIVE Sensitive     AMPICILLIN/SULBACTAM <=2 SENSITIVE Sensitive     PIP/TAZO <=4 SENSITIVE Sensitive     Extended ESBL NEGATIVE Sensitive     * ESCHERICHIA COLI  Blood Culture ID Panel (Reflexed)      Status: Abnormal   Collection Time: 02/21/18  3:06 AM  Result Value Ref Range Status   Enterococcus species NOT DETECTED NOT DETECTED Final   Listeria monocytogenes NOT DETECTED NOT DETECTED Final   Staphylococcus species NOT DETECTED NOT DETECTED Final   Staphylococcus aureus (BCID) NOT DETECTED NOT DETECTED Final   Streptococcus species NOT DETECTED NOT DETECTED Final   Streptococcus agalactiae NOT DETECTED NOT DETECTED Final   Streptococcus pneumoniae NOT DETECTED NOT DETECTED Final   Streptococcus pyogenes NOT DETECTED NOT DETECTED Final   Acinetobacter baumannii NOT DETECTED NOT DETECTED Final   Enterobacteriaceae species DETECTED (A) NOT DETECTED Final    Comment: Enterobacteriaceae represent a large family of gram-negative bacteria, not a single organism. CRITICAL RESULT CALLED TO, READ BACK BY AND VERIFIED WITH: PHARMD K PIRCE [phone] BY GF    Enterobacter cloacae complex NOT DETECTED NOT DETECTED Final   Escherichia coli DETECTED (A) NOT DETECTED Final    Comment: PHARMD K PIRCE [phone] BY GF   Klebsiella oxytoca NOT DETECTED NOT DETECTED Final   Klebsiella pneumoniae NOT DETECTED NOT DETECTED Final   Proteus species NOT DETECTED NOT DETECTED Final   Serratia marcescens NOT DETECTED NOT DETECTED Final   Carbapenem resistance NOT DETECTED NOT DETECTED Final   Haemophilus influenzae NOT DETECTED NOT DETECTED Final   Neisseria meningitidis NOT DETECTED NOT DETECTED Final   Pseudomonas aeruginosa NOT DETECTED NOT DETECTED Final   Candida albicans NOT DETECTED NOT DETECTED Final   Candida glabrata NOT DETECTED NOT DETECTED Final   Candida krusei NOT DETECTED NOT DETECTED Final   Candida parapsilosis NOT DETECTED NOT DETECTED Final   Candida tropicalis NOT DETECTED NOT DETECTED Final    Comment: Performed at [facility], [address], [place], [place] [address]  Blood culture (routine x 2)     Status: None   Collection Time: 02/21/18  5:20 AM  Result Value Ref  Range Status   Specimen Description BLOOD RIGHT HAND  Final   Special Requests   Final    BOTTLES DRAWN AEROBIC AND ANAEROBIC Blood Culture adequate volume   Culture   Final    NO GROWTH 5 DAYS Performed at [facility], [address], [place], [place] [address]    Report Status 02/26/2018 FINAL  Final  Urine culture     Status: Abnormal  Collection Time: 02/21/18  6:15 AM  Result Value Ref Range Status   Specimen Description URINE, CATHETERIZED  Final   Special Requests   Final    NONE Performed at [facility], [address] [place], [place] [address]    Culture 40,000 COLONIES/mL ESCHERICHIA COLI (A)  Final   Report Status 02/24/2018 FINAL  Final   Organism ID, Bacteria ESCHERICHIA COLI (A)  Final      Susceptibility   Escherichia coli - MIC*    AMPICILLIN <=2 SENSITIVE Sensitive     CEFAZOLIN <=4 SENSITIVE Sensitive     CEFTRIAXONE <=1 SENSITIVE Sensitive     CIPROFLOXACIN <=0.25 SENSITIVE Sensitive     GENTAMICIN <=1 SENSITIVE Sensitive     IMIPENEM <=0.25 SENSITIVE Sensitive     NITROFURANTOIN <=16 SENSITIVE Sensitive     TRIMETH/SULFA <=20 SENSITIVE Sensitive     AMPICILLIN/SULBACTAM <=2 SENSITIVE Sensitive     PIP/TAZO <=4 SENSITIVE Sensitive     Extended ESBL NEGATIVE Sensitive     * 40,000 COLONIES/mL ESCHERICHIA COLI  MRSA PCR Screening     Status: None   Collection Time: 02/21/18  8:53 AM  Result Value Ref Range Status   MRSA by PCR NEGATIVE NEGATIVE Final    Comment:        The GeneXpert MRSA Assay (FDA approved for NASAL specimens only), is one component of a comprehensive MRSA colonization surveillance program. It is not intended to diagnose MRSA infection nor to guide or monitor treatment for MRSA infections. Performed at [facility], [address] [place], [place] [address]          Radiology Studies: No results found.      Scheduled Meds: . sodium chloride   Intravenous Once  . amiodarone  200 mg Oral Daily    . aspirin EC  81 mg Oral Daily  . atorvastatin  40 mg Oral q1800  . benzonatate  100 mg Oral TID  . cyanocobalamin  1,000 mcg Intramuscular Daily  . DULoxetine  60 mg Oral q1800  . guaiFENesin  1,200 mg Oral BID  . levothyroxine  112 mcg Oral Q0600  . Melatonin  3 mg Oral QHS  . metoprolol tartrate  25 mg Oral BID  . pantoprazole  40 mg Oral Q1200  . polyethylene glycol  17 g Oral Daily  . tamsulosin  0.4 mg Oral QPC supper  . warfarin  7.5 mg Oral ONCE-1800  . Warfarin - Pharmacist Dosing Inpatient   Does not apply q1800   Continuous Infusions: . sodium chloride 50 mL/hr at 02/25/18 0648  .  ceFAZolin (ANCEF) IV 1 g (02/26/18 0917)     LOS: 5 days     Huey Bienenstockawood Myrissa Chipley, MD [facility] 02/26/2018, 3:33 PM  If 7PM-7AM, please contact night-coverage [email or web address]

## 2018-02-26 NOTE — Progress Notes (Signed)
Patient refused physical therapy this afternoon. After MD discussed with him, he accepted to work with PT. Writer called PT and informed them. Will continue to monitor.

## 2018-02-26 NOTE — Progress Notes (Signed)
PT Cancellation Note  Patient Details Name: Jorge [name] MRN: [id number] DOB: 1946-01-07   Cancelled Treatment:    Reason Eval/Treat Not Completed: Patient declined, no reason specified. Pt coughing up blood and reported headache. Pt declining therapy at this time. PT will continue to follow acutely as available.    Alessandra BevelsJennifer M Keyandra Swenson 02/26/2018, 3:10 PM

## 2018-02-27 LAB — BASIC METABOLIC PANEL
Anion gap: 10 (ref 5–15)
BUN: 60 mg/dL — AB (ref 8–23)
CO2: 20 mmol/L — ABNORMAL LOW (ref 22–32)
CREATININE: 3.1 mg/dL — AB (ref 0.61–1.24)
Calcium: 7.8 mg/dL — ABNORMAL LOW (ref 8.9–10.3)
Chloride: 105 mmol/L (ref 98–111)
GFR, EST AFRICAN AMERICAN: 22 mL/min — AB (ref >60)
GFR, EST NON AFRICAN AMERICAN: 19 mL/min — AB (ref >60)
Glucose, Bld: 115 mg/dL — ABNORMAL HIGH (ref 70–99)
Potassium: 4.6 mmol/L (ref 3.5–5.1)
SODIUM: 135 mmol/L (ref 135–145)

## 2018-02-27 LAB — CBC
HCT: 35.6 % — ABNORMAL LOW (ref 39.0–52.0)
Hemoglobin: 11.6 g/dL — ABNORMAL LOW (ref 13.0–17.0)
MCH: 30.3 pg (ref 26.0–34.0)
MCHC: 32.6 g/dL (ref 30.0–36.0)
MCV: 93 fL (ref 80.0–100.0)
Platelets: 210 10*3/uL (ref 150–400)
RBC: 3.83 MIL/uL — AB (ref 4.22–5.81)
RDW: 14.8 % (ref 11.5–15.5)
WBC: 15.5 10*3/uL — ABNORMAL HIGH (ref 4.0–10.5)
nRBC: 0 % (ref 0.0–0.2)

## 2018-02-27 LAB — PROTIME-INR
INR: 3.37
PROTHROMBIN TIME: 33.6 s — AB (ref 11.4–15.2)

## 2018-02-27 MED ORDER — TAMSULOSIN HCL 0.4 MG PO CAPS: 0.4000 mg | ORAL_CAPSULE | Freq: Every day | ORAL | 0 refills | Status: DC

## 2018-02-27 MED ORDER — CEPHALEXIN 500 MG PO CAPS
500.0000 mg | ORAL_CAPSULE | Freq: Three times a day (TID) | ORAL | Status: DC
  Administered 2018-02-27: 500 mg via ORAL
  Filled 2018-02-27: qty 1

## 2018-02-27 MED ORDER — CEPHALEXIN 500 MG PO CAPS: 500.0000 mg | ORAL_CAPSULE | Freq: Three times a day (TID) | ORAL | 0 refills | Status: DC

## 2018-02-27 NOTE — Care Management Note (Signed)
Case Management Note  Patient Details  Name: Jorge DroughtGeorge O Barthold MRN: [id number] Date of Birth: May 12, 1945  Subjective/Objective:                    Action/Plan:  Patient states that he has used [facility] in the past and would like to use them again. [facility]. Patient states that he has several walkers at home, deferred one at [place]. He states that he has support from family at home, and they will provide transportation at [place] today. No other CM needs identified.    Expected Discharge Date:  02/27/18               Expected Discharge Plan:  Home w Home Health Services  In-House Referral:  Clinical Social Work  Discharge planning Services  CM Consult  Post Acute Care Choice:  Home Health Choice offered to:  Patient  DME Arranged:    DME Agency:     HH Arranged:  RN, PT, Nurse's Aide, Social Work [employer] Agency:  [facility]  Status of Service:  Completed, signed off  If discussed at [employer] of [employer] Meetings, dates discussed:    Additional Comments:  Lawerance SabalDebbie Meer Reindl, RN 02/27/2018, 10:00 AM

## 2018-02-27 NOTE — Progress Notes (Signed)
CSW was notified that patient needs transportation via [facility] to home. CSW notified RNCM of need, CSW signing off.   McKeeAshley Leeon [place]ar, [place] [phone]

## 2018-02-27 NOTE — Discharge Instructions (Signed)
Follow with Primary MD Lonie Peakonroy, Nathan, PA-C in 5 days   Get CBC, CMP, 2 view Chest X ray -  checked  by Primary MD in 5 days   Activity: As tolerated with Full fall precautions use walker/cane & assistance as needed  Disposition Home   Diet: Heart Healthy    For Heart failure patients - Check your Weight same time everyday, if you gain over 2 pounds, or you develop in leg swelling, experience more shortness of breath or chest pain, call your Primary MD immediately. Follow Cardiac Low Salt Diet and 1.5 lit/day fluid restriction.  Special Instructions: If you have smoked or chewed Tobacco  in the last 2 yrs please stop smoking, stop any regular Alcohol  and or any Recreational drug use.  On your next visit with your primary care physician please Get Medicines reviewed and adjusted.  Please request your Prim.MD to go over all Hospital Tests and Procedure/Radiological results at the follow up, please get all Hospital records sent to your Prim MD by signing hospital release before you go home.  If you experience worsening of your admission symptoms, develop shortness of breath, life threatening emergency, suicidal or homicidal thoughts you must seek medical attention immediately by calling 911 or calling your MD immediately  if symptoms less severe.  You Must read complete instructions/literature along with all the possible adverse reactions/side effects for all the Medicines you take and that have been prescribed to you. Take any new Medicines after you have completely understood and accpet all the possible adverse reactions/side effects.

## 2018-02-27 NOTE — Progress Notes (Signed)
[facility] arranged for transport 

## 2018-02-27 NOTE — Discharge Summary (Signed)
Jorge [name] WUJ:[id number] DOB: Jun 16, 1945 DOA: 02/21/2018  PCP: [name], PA-C  Admit date: 02/21/2018  Discharge date: 02/27/2018  Admitted From: Home   Disposition:  Home   Recommendations for Outpatient Follow-up:   Follow up with PCP in 1-2 weeks  PCP Please obtain BMP/CBC, 2 view CXR in 1week,  (see Discharge instructions)   PCP Please follow up on the following pending results: Monitor weight, BMP and diuretic need closely.   Home Health: PT,RN, S Work   Equipment/Devices: Agricultural consultant  Consultations: None Discharge Condition: Stable   CODE STATUS: Full   Diet Recommendation: Heart Healthy     Chief Complaint  Patient presents with  . Urinary Tract Infection     Brief history of present illness from the day of admission and additional interim summary    Jorge [name] is a 72 y.o. male with a history of TAVR, PVD, chronic kidney disease stage III, paroxysmal A. fib, OSA, GERD, HTN, chronic diastolic CHF. Patient presented secondary to cough and dyspnea, found to have E. Coli bacteremia with likely urinary source. Patient transferred to ICU for hypotension/septic shock. On antibiotics, now narrowed to cefazolin for pan-sensitive e. Coli. Work-up significant for acute renal failure, peaked at 5.4, Improving.                                                                 Hospital Course   Severe sepsis secondary to E. Coli bacteremia/Acute pyelonephritis -sepsis was present on admission and he was hypotensive with acute renal failure and elevated lactate.  His cultures grew pansensitive E. coli, he was kept on IV antibiotics and will be transitioned to 7 more days of oral Keflex.  Blood pressure and renal function have improved, sepsis pathophysiology has resolved.  Will follow with PCP in  4 to 5 days.  Considerable weakness and deconditioning for which he will get home PT and RN, he refused SNF for which she qualified.   Acute kidney injury on CKD stage 3 - Baseline creatinine is 1, peaked at 5.2 during hospital stay, this is most likely in the setting of ATN due to sepsis/hypotension , his home dose diuretics were held he was hydrated, renal function has usually improved with good urine output, will be discharged, will continue to hold his home diuretics with PCP to monitor closely his BMP and diuretic need, one-time outpatient nephrology follow-up could be considered by PCP .   Chronic diastolic heart failure EF of 60% on echocardiogram done few months ago.  Currently stable continue home dose beta-blocker, diuretics held as above.  PCP to monitor closely.  Currently euvolemic.   Paroxysmal atrial fibrillation [place] vas 2 score of at least 3.  On amiodarone Coumadin and beta-blocker combination which will be continued PCP to monitor INR.  Lab  Results  Component Value Date   INR 3.37 02/27/2018   INR 2.87 02/26/2018   INR 3.09 02/25/2018     CAD  - Continue aspirin and Lipitor, Patient with nontypical/reproducible chest pain 02/25/2018, EKG nonacute, troponins non-ACS pattern.  Pain has resolved outpatient follow-up with his primary cardiologist recommended in 1 to 2 weeks.  Currently symptom-free.  Hypothyroidism -Continue Synthroid  Anemia of chronic disease -Anemia panel significant for anemia of chronic disease, and borderline B12, will be repeating  History of CVA, nonhemorrhagic -No new focal deficits, continue with warfarin  She did report small amount of blood-tinged sputum, and warfarin and aspirin, looked at his serum, very minimal amount of blood-tinged sputum, will continue to monitor.  Morbid obesity Body mass index is 42.82 kg/m.  Deconditioning and weakness.  Qualified for SNF but refused as he does not want to pay out-of-pocket for it, home  health PT etc. have been ordered along with rolling walker.   Discharge diagnosis     Active Problems:   Aortic stenosis   CAD (coronary artery disease)   HLD (hyperlipidemia)   Essential hypertension   Paroxysmal atrial fibrillation (HCC)   Abdominal pain   History of cerebrovascular accident (CVA) with residual deficit   BPH (benign prostatic hyperplasia)   Obesity, Class III, BMI 40-49.9 (morbid obesity) (HCC)   Chronic intermittent hypoxia with obstructive sleep apnea   OSA on CPAP   Acute on chronic diastolic congestive heart failure (HCC)   Anticoagulated on warfarin   Severe sepsis (HCC)   AKI (acute kidney injury) (HCC)   CKD (chronic kidney disease) stage 3, GFR 30-59 ml/min [facility])    Discharge instructions    Discharge Instructions    Discharge instructions   Complete by:  As directed    Follow with Primary MD [name], PA-C in 5 days   Get CBC, CMP, 2 view Chest X ray -  checked  by Primary MD in 5 days   Activity: As tolerated with Full fall precautions use walker/cane & assistance as needed  Disposition Home   Diet: Heart Healthy    For Heart failure patients - Check your Weight same time everyday, if you gain over 2 pounds, or you develop in leg swelling, experience more shortness of breath or chest pain, call your Primary MD immediately. Follow Cardiac Low Salt Diet and 1.5 lit/day fluid restriction.  Special Instructions: If you have smoked or chewed Tobacco  in the last 2 yrs please stop smoking, stop any regular Alcohol  and or any Recreational drug use.  On your next visit with your primary care physician please Get Medicines reviewed and adjusted.  Please request your Prim.MD to go over all Hospital Tests and Procedure/Radiological results at the follow up, please get all Hospital records sent to your Prim MD by signing hospital release before you go home.  If you experience worsening of your admission symptoms, develop shortness of breath,  life threatening emergency, suicidal or homicidal thoughts you must seek medical attention immediately by calling 911 or calling your MD immediately  if symptoms less severe.  You Must read complete instructions/literature along with all the possible adverse reactions/side effects for all the Medicines you take and that have been prescribed to you. Take any new Medicines after you have completely understood and accpet all the possible adverse reactions/side effects.   Increase activity slowly   Complete by:  As directed    Increase activity slowly   Complete by:  As directed  Discharge Medications   Allergies as of 02/27/2018   No Known Allergies     Medication List    STOP taking these medications   furosemide 40 MG tablet Commonly known as:  LASIX   gabapentin 400 MG capsule Commonly known as:  NEURONTIN   HYDROcodone-acetaminophen 5-325 MG tablet Commonly known as:  NORCO/VICODIN   LOVENOX 150 MG/ML injection Generic drug:  enoxaparin   spironolactone 50 MG tablet Commonly known as:  ALDACTONE     TAKE these medications   acetaminophen 325 MG tablet Commonly known as:  TYLENOL Take 2 tablets (650 mg total) by mouth every 4 (four) hours as needed for headache or mild pain.   amiodarone 200 MG tablet Commonly known as:  PACERONE Take 1 tablet (200 mg total) by mouth daily.   aspirin EC 81 MG tablet Take 81 mg by mouth daily. (0800)   atorvastatin 40 MG tablet Commonly known as:  LIPITOR Take 40 mg by mouth daily at 6 PM.   bisacodyl 5 MG EC tablet Commonly known as:  DULCOLAX Take 5 mg by mouth daily as needed for moderate constipation.   cephALEXin 500 MG capsule Commonly known as:  KEFLEX Take 1 capsule (500 mg total) by mouth every 8 (eight) hours.   DULoxetine 60 MG capsule Commonly known as:  CYMBALTA Take 60 mg by mouth daily at 6 PM.   fluticasone 50 MCG/ACT nasal spray Commonly known as:  FLONASE Place 1 spray into both nostrils daily as  needed for allergies.   levothyroxine 100 MCG tablet Commonly known as:  SYNTHROID, LEVOTHROID Take 100 mcg by mouth daily at 6 (six) AM.   Melatonin 5 MG Tabs Take 5 mg by mouth at bedtime. (2100)   metoprolol tartrate 25 MG tablet Commonly known as:  LOPRESSOR Take 25 mg by mouth 2 (two) times daily. (0900 & 1700)   nitroGLYCERIN 0.4 MG SL tablet Commonly known as:  NITROSTAT Place 1 tablet (0.4 mg total) under the tongue every 5 (five) minutes as needed for chest pain.   omeprazole 40 MG capsule Commonly known as:  PRILOSEC Take 40 mg by mouth daily as needed (indigestion).   polyethylene glycol powder powder Commonly known as:  GLYCOLAX/MIRALAX Take 17 g by mouth daily. What changed:  additional instructions   tamsulosin 0.4 MG Caps capsule Commonly known as:  FLOMAX Take 1 capsule (0.4 mg total) by mouth daily after supper.   traMADol 50 MG tablet Commonly known as:  ULTRAM Take 100 mg by mouth 3 (three) times daily as needed for moderate pain.   warfarin 5 MG tablet Commonly known as:  COUMADIN Take 1.5 tablets (7.5 mg total) by mouth daily. Take as directed by PCP            Durable Medical Equipment  (From admission, onward)         Start     Ordered   02/27/18 0912  For home use only DME Walker rolling  [facility])  Once    Question:  Patient needs a walker to treat with the following condition  Answer:  Weakness   02/27/18 0911          Follow-up Information    [name], PA-C. Schedule an appointment as soon as possible for a visit in 1 week(s).   Specialty:  Physician Assistant Contact information: [address] [place] [address] [phone]        Wendall Stade, MD. Schedule an appointment as soon as  possible for a visit in 1 week(s).   Specialty:  Cardiology Contact information: [address] [place] [address] [phone]        Zetta Bills, MD. Schedule an appointment as soon as possible for a  visit in 1 week(s).   Specialty:  Nephrology Why:  ARF Contact information: [address] [place] [address] [phone]           Major procedures and Radiology Reports - PLEASE review detailed and final reports thoroughly  -        Ct Head Wo Contrast  Result Date: 02/21/2018 CLINICAL DATA:  Status post fall out of bed, with concern for head injury. Initial encounter. EXAM: CT HEAD WITHOUT CONTRAST TECHNIQUE: Contiguous axial images were obtained from the base of the skull through the vertex without intravenous contrast. COMPARISON:  CT of the head performed 01/07/2018 FINDINGS: Brain: No evidence of acute infarction, hemorrhage, hydrocephalus, extra-axial collection or mass lesion / mass effect. Prominence of the ventricles and sulci reflects mild cortical volume loss. Mild subcortical white matter change likely reflects small vessel ischemic microangiopathy. The brainstem and fourth ventricle are within normal limits. The basal ganglia are unremarkable in appearance. The cerebral hemispheres demonstrate grossly normal gray-white differentiation. No mass effect or midline shift is seen. Vascular: No hyperdense vessel or unexpected calcification. Skull: There is no evidence of fracture; multiple large maxillary dental caries are noted. Sinuses/Orbits: Mild bilateral proptosis is noted. The orbits are otherwise unremarkable. There is mild partial opacification of the mastoid air cells bilaterally. The paranasal sinuses are well-aerated. Other: No significant soft tissue abnormalities are seen. IMPRESSION: 1. No evidence of traumatic intracranial injury or fracture. 2. Mild cortical volume loss and scattered small vessel ischemic microangiopathy. 3. Mild bilateral proptosis noted. 4. Multiple large maxillary dental caries noted. 5. Mild partial opacification of the mastoid air cells bilaterally. Electronically Signed   By: Roanna Raider M.D.   On: 02/21/2018 05:09   Dg Chest Port 1  View  Result Date: 02/22/2018 CLINICAL DATA:  Dyspnea, shortness of breath, and upper mid chest discomfort for the past 2 days. Nonproductive cough as well. History of asthma, CHF, trans catheter aortic valve replacement approximately 20 months ago. EXAM: PORTABLE CHEST 1 VIEW COMPARISON:  Portable chest x-ray of February 21, 2018 FINDINGS: The right hemidiaphragm appears higher than the left. Right basilar parenchymal density has developed. The left lung is better inflated. The interstitial markings in the mid and lower left lung are coarse but fairly stable. The left heart border is mildly prominent. The right heart border is obscured. The pulmonary vascularity is engorged and less distinct with more cephalization. IMPRESSION: CHF with mild interstitial edema. Right basilar atelectasis or pneumonia. Probable small right pleural effusion. Thoracic aortic atherosclerosis. The appearance of the chest has deteriorated since the study of 2 days ago. Electronically Signed   By: David  [place] M.D.   On: 02/22/2018 10:02   Dg Chest Port 1 View  Result Date: 02/21/2018 CLINICAL DATA:  Status post fall out of bed tonight. Recent urinary tract infection. Shortness of breath. EXAM: PORTABLE CHEST 1 VIEW COMPARISON:  Chest radiograph performed 12/24/2017 FINDINGS: The lungs are mildly hypoexpanded. Vascular congestion is noted. Increased interstitial markings may reflect transient interstitial edema. There is no evidence of pleural effusion or pneumothorax. The cardiomediastinal silhouette is mildly enlarged. No acute osseous abnormalities are seen. IMPRESSION: Lungs mildly hypoexpanded. Vascular congestion and mild cardiomegaly. Increased interstitial markings may reflect transient interstitial edema. No displaced  rib fracture seen. Electronically Signed   By: Roanna Raider M.D.   On: 02/21/2018 03:47   Ct Renal Stone Study  Result Date: 02/21/2018 CLINICAL DATA:  Status post fall out of bed, with concern for  abdominal injury. Recent urinary tract infection. EXAM: CT ABDOMEN AND PELVIS WITHOUT CONTRAST TECHNIQUE: Multidetector CT imaging of the abdomen and pelvis was performed following the standard protocol without IV contrast. COMPARISON:  CT of the abdomen and pelvis performed 12/25/2017 FINDINGS: Lower chest: Mild bibasilar opacities likely reflect atelectasis. An aortic valve replacement is noted. Hepatobiliary: There is suggestion of small stones within the gallbladder. The liver is unremarkable in appearance. The common bile duct remains normal in caliber. Pancreas: The pancreas is within normal limits. Spleen: The spleen is unremarkable in appearance. Adrenals/Urinary Tract: The adrenal glands are unremarkable in appearance. Nonspecific perinephric stranding is noted bilaterally. Mild soft tissue inflammation tracks about the ureters, raising question for ureteritis. Soft tissue inflammation extends into the lower quadrants. Underlying pyelonephritis cannot be excluded. There is no evidence of hydronephrosis. No renal or ureteral stones are identified. Stomach/Bowel: The stomach is unremarkable in appearance. The small bowel is within normal limits. The appendix is not visualized; there is no evidence for appendicitis. The colon is unremarkable in appearance. Vascular/Lymphatic: Scattered calcification is seen along the abdominal aorta and its branches. The abdominal aorta is otherwise grossly unremarkable. The inferior vena cava is grossly unremarkable. No retroperitoneal lymphadenopathy is seen. No pelvic sidewall lymphadenopathy is identified. Reproductive: The bladder is decompressed and not well characterized. The prostate is borderline enlarged, measuring 4.8 cm in transverse dimension. Other: No additional soft tissue abnormalities are seen. Musculoskeletal: No acute osseous abnormalities are identified. The patient is status post interbody fusion at L5-S1. Vacuum phenomenon is noted along the lower lumbar  spine. The visualized musculature is unremarkable in appearance. IMPRESSION: 1. Mild soft tissue inflammation tracks about the ureters, raising question for ureteritis. Soft tissue inflammation extends into the lower quadrants. Underlying pyelonephritis cannot be excluded. 2. Borderline enlarged prostate. 3. Mild bibasilar airspace opacities likely reflect atelectasis. 4. Suggestion of small stones within the gallbladder. Gallbladder otherwise grossly unremarkable. Aortic Atherosclerosis (ICD10-I70.0). Electronically Signed   By: Roanna Raider M.D.   On: 02/21/2018 05:14   Vas US Carotid  Result Date: 01/28/2018 Carotid Arterial Duplex Study Indications:  TIA, Speech disturbance and 01/15/2018 Trans carotid               revascularization. Risk Factors: Hypertension, hyperlipidemia, past history of smoking, coronary               artery disease. Performing Technologist: Hardie Lora RVT  Examination Guidelines: A complete evaluation includes B-mode imaging, spectral Doppler, color Doppler, and power Doppler as needed of all accessible portions of each vessel. Bilateral testing is considered an integral part of a complete examination. Limited examinations for reoccurring indications may be performed as noted.  Right Carotid Findings: +----------+--------+--------+--------+------------+--------+           PSV cm/sEDV cm/sStenosisDescribe    Comments +----------+--------+--------+--------+------------+--------+ CCA Prox  46      12                                   +----------+--------+--------+--------+------------+--------+ CCA Mid   79      18                                   +----------+--------+--------+--------+------------+--------+  CCA Distal55      16                                   +----------+--------+--------+--------+------------+--------+ ICA Prox  274     76      60-79%  heterogenous         +----------+--------+--------+--------+------------+--------+ ICA  Mid   131     41                                   +----------+--------+--------+--------+------------+--------+ ICA Distal98      34                                   +----------+--------+--------+--------+------------+--------+ ECA       170     25      >50%    heterogenous         +----------+--------+--------+--------+------------+--------+ +----------+--------+-------+--------+-------------------+           PSV cm/sEDV cmsDescribeArm Pressure (mmHG) +----------+--------+-------+--------+-------------------+ [id number]             Stenotic                    +----------+--------+-------+--------+-------------------+ +---------+--------+--+--------+---------------+ VertebralPSV cm/s32EDV cm/sBi- directional +---------+--------+--+--------+---------------+  Left Carotid Findings: +----------+--------+--------+--------+--------+--------+           PSV cm/sEDV cm/sStenosisDescribeComments +----------+--------+--------+--------+--------+--------+ CCA Prox  124     35                               +----------+--------+--------+--------+--------+--------+ CCA Mid   94      23                               +----------+--------+--------+--------+--------+--------+ CCA Distal97      20                               +----------+--------+--------+--------+--------+--------+ ICA Prox  119     23      1-39%                    +----------+--------+--------+--------+--------+--------+ ICA Mid   80      31                               +----------+--------+--------+--------+--------+--------+ ICA Distal84      31                               +----------+--------+--------+--------+--------+--------+ ECA       99      20                               +----------+--------+--------+--------+--------+--------+ +----------+--------+--------+----------------+-------------------+ SubclavianPSV cm/sEDV cm/sDescribe        Arm Pressure  (mmHG) +----------+--------+--------+----------------+-------------------+           82              Multiphasic, WNL                    +----------+--------+--------+----------------+-------------------+ +---------+--------+--+--------+--+---------+  VertebralPSV cm/s61EDV cm/s17Antegrade +---------+--------+--+--------+--+---------+ Left ECA is no longer bi directional as compared to pre intervention duplex.  Left Stent(s): +---------------+--------+--------+--------+--------+--------------+ common to ICA  PSV cm/sEDV cm/sStenosisWaveformComments       +---------------+--------+--------+--------+--------+--------------+ Prox to Stent  124                                            +---------------+--------+--------+--------+--------+--------------+ Proximal Stent 114                                            +---------------+--------+--------+--------+--------+--------------+ Mid Stent      91                              Turbulent flow +---------------+--------+--------+--------+--------+--------------+ Distal Stent   60                              Turbulent flow +---------------+--------+--------+--------+--------+--------------+ Distal to Stent119                                            +---------------+--------+--------+--------+--------+--------------+ Peak systolic velocities increase from 69.1 cm/sec in the proximal stent to 173 cm/sec at the "waist" suggest >50% residual stenosis. Stent diameter transverse proximally 1.0 cm, 0.60 cm at the "waist".  Summary: Right Carotid: Velocities in the right ICA are consistent with a 60-79%                stenosis. The ECA appears >50% stenosed. Left Carotid: Velocities in the left ICA are consistent with a 1-39% stenosis.               There is a waist mid stent at the origin of the ICA consistent               with operative findings with 30% narrowing comparted to the distal               ICA. Vertebrals:   Left vertebral artery demonstrates antegrade flow. Right vertebral              artery demonstrates bidirectional flow. Subclavians: Right subclavian artery was stenotic. Normal flow hemodynamics were              seen in the left subclavian artery. *See table(s) above for measurements and observations.  Electronically signed by Fabienne Bruns MD on 01/28/2018 at 12:39:13 PM.    Final     Micro Results    Recent Results (from the past 240 hour(s))  Blood culture (routine x 2)     Status: Abnormal   Collection Time: 02/21/18  3:06 AM  Result Value Ref Range Status   Specimen Description BLOOD LEFT HAND  Final   Special Requests   Final    BOTTLES DRAWN AEROBIC AND ANAEROBIC Blood Culture adequate volume   Culture  Setup Time   Final    GRAM NEGATIVE RODS IN BOTH AEROBIC AND ANAEROBIC BOTTLES CRITICAL RESULT CALLED TO, READ BACK BY AND VERIFIED WITH: PHARMD K PIRCE [phone] BY GF Performed at [facility], [address], [place],  [place] [address]    Culture ESCHERICHIA COLI (A)  Final   Report Status 02/23/2018 FINAL  Final   Organism ID, Bacteria ESCHERICHIA COLI  Final      Susceptibility   Escherichia coli - MIC*    AMPICILLIN <=2 SENSITIVE Sensitive     CEFAZOLIN <=4 SENSITIVE Sensitive     CEFEPIME <=1 SENSITIVE Sensitive     CEFTAZIDIME <=1 SENSITIVE Sensitive     CEFTRIAXONE <=1 SENSITIVE Sensitive     CIPROFLOXACIN <=0.25 SENSITIVE Sensitive     GENTAMICIN <=1 SENSITIVE Sensitive     IMIPENEM <=0.25 SENSITIVE Sensitive     TRIMETH/SULFA <=20 SENSITIVE Sensitive     AMPICILLIN/SULBACTAM <=2 SENSITIVE Sensitive     PIP/TAZO <=4 SENSITIVE Sensitive     Extended ESBL NEGATIVE Sensitive     * ESCHERICHIA COLI  Blood Culture ID Panel (Reflexed)     Status: Abnormal   Collection Time: 02/21/18  3:06 AM  Result Value Ref Range Status   Enterococcus species NOT DETECTED NOT DETECTED Final   Listeria monocytogenes NOT DETECTED NOT DETECTED Final   Staphylococcus  species NOT DETECTED NOT DETECTED Final   Staphylococcus aureus (BCID) NOT DETECTED NOT DETECTED Final   Streptococcus species NOT DETECTED NOT DETECTED Final   Streptococcus agalactiae NOT DETECTED NOT DETECTED Final   Streptococcus pneumoniae NOT DETECTED NOT DETECTED Final   Streptococcus pyogenes NOT DETECTED NOT DETECTED Final   Acinetobacter baumannii NOT DETECTED NOT DETECTED Final   Enterobacteriaceae species DETECTED (A) NOT DETECTED Final    Comment: Enterobacteriaceae represent a large family of gram-negative bacteria, not a single organism. CRITICAL RESULT CALLED TO, READ BACK BY AND VERIFIED WITH: PHARMD K PIRCE [phone] BY GF    Enterobacter cloacae complex NOT DETECTED NOT DETECTED Final   Escherichia coli DETECTED (A) NOT DETECTED Final    Comment: PHARMD K PIRCE [phone] BY GF   Klebsiella oxytoca NOT DETECTED NOT DETECTED Final   Klebsiella pneumoniae NOT DETECTED NOT DETECTED Final   Proteus species NOT DETECTED NOT DETECTED Final   Serratia marcescens NOT DETECTED NOT DETECTED Final   Carbapenem resistance NOT DETECTED NOT DETECTED Final   Haemophilus influenzae NOT DETECTED NOT DETECTED Final   Neisseria meningitidis NOT DETECTED NOT DETECTED Final   Pseudomonas aeruginosa NOT DETECTED NOT DETECTED Final   Candida albicans NOT DETECTED NOT DETECTED Final   Candida glabrata NOT DETECTED NOT DETECTED Final   Candida krusei NOT DETECTED NOT DETECTED Final   Candida parapsilosis NOT DETECTED NOT DETECTED Final   Candida tropicalis NOT DETECTED NOT DETECTED Final    Comment: Performed at [facility], [address], [place], [place] [address]  Blood culture (routine x 2)     Status: None   Collection Time: 02/21/18  5:20 AM  Result Value Ref Range Status   Specimen Description BLOOD RIGHT HAND  Final   Special Requests   Final    BOTTLES DRAWN AEROBIC AND ANAEROBIC Blood Culture adequate volume   Culture   Final    NO GROWTH 5 DAYS Performed at [facility], [address] [place], [place] [address]    Report Status 02/26/2018 FINAL  Final  Urine culture     Status: Abnormal   Collection Time: 02/21/18  6:15 AM  Result Value Ref Range Status   Specimen Description URINE, CATHETERIZED  Final   Special Requests   Final    NONE Performed at [facility], [address]  [place], [place] [address]    Culture 40,000 COLONIES/mL ESCHERICHIA COLI (A)  Final   Report Status 02/24/2018 FINAL  Final   Organism ID, Bacteria ESCHERICHIA COLI (A)  Final      Susceptibility   Escherichia coli - MIC*    AMPICILLIN <=2 SENSITIVE Sensitive     CEFAZOLIN <=4 SENSITIVE Sensitive     CEFTRIAXONE <=1 SENSITIVE Sensitive     CIPROFLOXACIN <=0.25 SENSITIVE Sensitive     GENTAMICIN <=1 SENSITIVE Sensitive     IMIPENEM <=0.25 SENSITIVE Sensitive     NITROFURANTOIN <=16 SENSITIVE Sensitive     TRIMETH/SULFA <=20 SENSITIVE Sensitive     AMPICILLIN/SULBACTAM <=2 SENSITIVE Sensitive     PIP/TAZO <=4 SENSITIVE Sensitive     Extended ESBL NEGATIVE Sensitive     * 40,000 COLONIES/mL ESCHERICHIA COLI  MRSA PCR Screening     Status: None   Collection Time: 02/21/18  8:53 AM  Result Value Ref Range Status   MRSA by PCR NEGATIVE NEGATIVE Final    Comment:        The GeneXpert MRSA Assay (FDA approved for NASAL specimens only), is one component of a comprehensive MRSA colonization surveillance program. It is not intended to diagnose MRSA infection nor to guide or monitor treatment for MRSA infections. Performed at [facility], [address] [place], [place] [address]     Today   Subjective    Chay Mazzoni today has no headache,no chest abdominal pain,no new weakness tingling or numbness, feels much better wants to go home today.    Objective   Blood pressure (!) 155/77, pulse 81, temperature 98.5 F (36.9 C), resp. rate 16, height 6' (1.829 m), weight (!) 143.2 kg, SpO2 96 %.   Intake/Output Summary (Last 24 hours)  at 02/27/2018 0912 Last data filed at 02/27/2018 0222 Gross per 24 hour  Intake 940 ml  Output 2800 ml  Net -1860 ml    Exam Awake Alert, Oriented x 3, No new F.N deficits, Normal affect [place].AT,PERRAL Supple Neck,No JVD, No cervical lymphadenopathy appriciated.  Symmetrical Chest wall movement, Good air movement bilaterally, CTAB RRR,No Gallops,Rubs or new Murmurs, No Parasternal Heave +ve B.Sounds, Abd Soft, Non tender, No organomegaly appriciated, No rebound -guarding or rigidity. No Cyanosis, Clubbing or edema, No new Rash or bruise   Data Review   CBC w Diff:  Lab Results  Component Value Date   WBC 15.5 (H) 02/27/2018   HGB 11.6 (L) 02/27/2018   HGB 14.2 01/14/2017   HCT 35.6 (L) 02/27/2018   HCT 42.3 01/14/2017   PLT 210 02/27/2018   PLT 242 01/14/2017   LYMPHOPCT 2 02/21/2018   MONOPCT 10 02/21/2018   EOSPCT 0 02/21/2018   BASOPCT 0 02/21/2018    CMP:  Lab Results  Component Value Date   NA 135 02/27/2018   NA 142 01/14/2017   K 4.6 02/27/2018   CL 105 02/27/2018   CO2 20 (L) 02/27/2018   BUN 60 (H) 02/27/2018   BUN 18 01/14/2017   CREATININE 3.10 (H) 02/27/2018   PROT 6.2 (L) 02/23/2018   ALBUMIN 2.1 (L) 02/23/2018   BILITOT 0.7 02/23/2018   ALKPHOS 113 02/23/2018   AST 25 02/23/2018   ALT 25 02/23/2018  .   Total Time in preparing paper work, data evaluation and todays exam - 35 minutes  Susa Raring M.D on 02/27/2018 at 9:12 AM  [facility]   Office  [phone]

## 2018-02-27 NOTE — Progress Notes (Addendum)
Nsg Discharge Note  Admit Date:  02/21/2018 Discharge date: 02/27/2018   Jorge DroughtGeorge O [name] to be D/C'd Home per MD order.  AVS completed.  Copy for chart, and copy for patient signed, and dated. Patient/caregiver able to verbalize understanding.  Discharge Medication: Allergies as of 02/27/2018   No Known Allergies     Medication List    STOP taking these medications   furosemide 40 MG tablet Commonly known as:  LASIX   gabapentin 400 MG capsule Commonly known as:  NEURONTIN   HYDROcodone-acetaminophen 5-325 MG tablet Commonly known as:  NORCO/VICODIN   LOVENOX 150 MG/ML injection Generic drug:  enoxaparin   spironolactone 50 MG tablet Commonly known as:  ALDACTONE     TAKE these medications   acetaminophen 325 MG tablet Commonly known as:  TYLENOL Take 2 tablets (650 mg total) by mouth every 4 (four) hours as needed for headache or mild pain.   amiodarone 200 MG tablet Commonly known as:  PACERONE Take 1 tablet (200 mg total) by mouth daily.   aspirin EC 81 MG tablet Take 81 mg by mouth daily. (0800)   atorvastatin 40 MG tablet Commonly known as:  LIPITOR Take 40 mg by mouth daily at 6 PM.   bisacodyl 5 MG EC tablet Commonly known as:  DULCOLAX Take 5 mg by mouth daily as needed for moderate constipation.   cephALEXin 500 MG capsule Commonly known as:  KEFLEX Take 1 capsule (500 mg total) by mouth every 8 (eight) hours.   DULoxetine 60 MG capsule Commonly known as:  CYMBALTA Take 60 mg by mouth daily at 6 PM.   fluticasone 50 MCG/ACT nasal spray Commonly known as:  FLONASE Place 1 spray into both nostrils daily as needed for allergies.   levothyroxine 100 MCG tablet Commonly known as:  SYNTHROID, LEVOTHROID Take 100 mcg by mouth daily at 6 (six) AM.   Melatonin 5 MG Tabs Take 5 mg by mouth at bedtime. (2100)   metoprolol tartrate 25 MG tablet Commonly known as:  LOPRESSOR Take 25 mg by mouth 2 (two) times daily. (0900 & 1700)   nitroGLYCERIN  0.4 MG SL tablet Commonly known as:  NITROSTAT Place 1 tablet (0.4 mg total) under the tongue every 5 (five) minutes as needed for chest pain.   omeprazole 40 MG capsule Commonly known as:  PRILOSEC Take 40 mg by mouth daily as needed (indigestion).   polyethylene glycol powder powder Commonly known as:  GLYCOLAX/MIRALAX Take 17 g by mouth daily. What changed:  additional instructions   tamsulosin 0.4 MG Caps capsule Commonly known as:  FLOMAX Take 1 capsule (0.4 mg total) by mouth daily after supper.   traMADol 50 MG tablet Commonly known as:  ULTRAM Take 100 mg by mouth 3 (three) times daily as needed for moderate pain.   warfarin 5 MG tablet Commonly known as:  COUMADIN Take 1.5 tablets (7.5 mg total) by mouth daily. Take as directed by PCP            Durable Medical Equipment  (From admission, onward)         Start     Ordered   02/27/18 0912  For home use only DME Walker rolling  [facility](Walkers)  Once    Question:  Patient needs a walker to treat with the following condition  Answer:  Weakness   02/27/18 0911          Discharge Assessment: Vitals:   02/27/18 0542 02/27/18 0822  BP: (!) 149/69 Marland Kitchen(!)  155/77  Pulse: 76 81  Resp: 16   Temp: 98.5 F (36.9 C)   SpO2: 96%    Skin clean, dry and intact without evidence of skin break down, no evidence of skin tears noted. IV catheter discontinued intact. Site without signs and symptoms of complications - no redness or edema noted at insertion site, patient denies c/o pain - only slight tenderness at site.  Dressing with slight pressure applied.  D/c Instructions-Education: Discharge instructions given to patient/family with verbalized understanding. D/c education completed with patient/family including follow up instructions, medication list, d/c activities limitations if indicated, with other d/c instructions as indicated by MD - patient able to verbalize understanding, all questions fully answered. Patient instructed  to return to ED, call 911, or call MD for any changes in condition.  Patient escorted via stretcher, and D/C home via [facility].  Lyndal Pulley, RN 02/27/2018 9:33 AM

## 2018-03-06 ENCOUNTER — Emergency Department (HOSPITAL_COMMUNITY): Payer: Medicare Other

## 2018-03-06 ENCOUNTER — Inpatient Hospital Stay (HOSPITAL_COMMUNITY)
Admission: EM | Admit: 2018-03-06 | Discharge: 2018-03-12 | DRG: 083 | Disposition: A | Discharge status: Skilled Nursing Facility | Payer: Medicare Other | Attending: Internal Medicine | Admitting: Internal Medicine

## 2018-03-06 ENCOUNTER — Encounter (HOSPITAL_COMMUNITY): Payer: Self-pay | Admitting: Emergency Medicine

## 2018-03-06 DIAGNOSIS — S065X9A Traumatic subdural hemorrhage with loss of consciousness of unspecified duration, initial encounter: Secondary | ICD-10-CM | POA: Diagnosis not present

## 2018-03-06 DIAGNOSIS — D638 Anemia in other chronic diseases classified elsewhere: Secondary | ICD-10-CM | POA: Diagnosis present

## 2018-03-06 DIAGNOSIS — N400 Enlarged prostate without lower urinary tract symptoms: Secondary | ICD-10-CM | POA: Diagnosis present

## 2018-03-06 DIAGNOSIS — I2510 Atherosclerotic heart disease of native coronary artery without angina pectoris: Secondary | ICD-10-CM | POA: Diagnosis present

## 2018-03-06 DIAGNOSIS — G4734 Idiopathic sleep related nonobstructive alveolar hypoventilation: Secondary | ICD-10-CM

## 2018-03-06 DIAGNOSIS — D689 Coagulation defect, unspecified: Secondary | ICD-10-CM | POA: Diagnosis not present

## 2018-03-06 DIAGNOSIS — S065XAA Traumatic subdural hemorrhage with loss of consciousness status unknown, initial encounter: Secondary | ICD-10-CM

## 2018-03-06 DIAGNOSIS — Z87891 Personal history of nicotine dependence: Secondary | ICD-10-CM

## 2018-03-06 DIAGNOSIS — Z8249 Family history of ischemic heart disease and other diseases of the circulatory system: Secondary | ICD-10-CM

## 2018-03-06 DIAGNOSIS — I6521 Occlusion and stenosis of right carotid artery: Secondary | ICD-10-CM

## 2018-03-06 DIAGNOSIS — D6832 Hemorrhagic disorder due to extrinsic circulating anticoagulants: Secondary | ICD-10-CM | POA: Diagnosis present

## 2018-03-06 DIAGNOSIS — K219 Gastro-esophageal reflux disease without esophagitis: Secondary | ICD-10-CM | POA: Diagnosis present

## 2018-03-06 DIAGNOSIS — R0902 Hypoxemia: Secondary | ICD-10-CM | POA: Diagnosis present

## 2018-03-06 DIAGNOSIS — S20219A Contusion of unspecified front wall of thorax, initial encounter: Secondary | ICD-10-CM | POA: Diagnosis present

## 2018-03-06 DIAGNOSIS — G4733 Obstructive sleep apnea (adult) (pediatric): Secondary | ICD-10-CM | POA: Diagnosis present

## 2018-03-06 DIAGNOSIS — R627 Adult failure to thrive: Secondary | ICD-10-CM | POA: Diagnosis present

## 2018-03-06 DIAGNOSIS — Z6841 Body Mass Index (BMI) 40.0 and over, adult: Secondary | ICD-10-CM

## 2018-03-06 DIAGNOSIS — Z66 Do not resuscitate: Secondary | ICD-10-CM | POA: Diagnosis present

## 2018-03-06 DIAGNOSIS — Z823 Family history of stroke: Secondary | ICD-10-CM

## 2018-03-06 DIAGNOSIS — Z515 Encounter for palliative care: Secondary | ICD-10-CM | POA: Diagnosis not present

## 2018-03-06 DIAGNOSIS — E782 Mixed hyperlipidemia: Secondary | ICD-10-CM | POA: Diagnosis present

## 2018-03-06 DIAGNOSIS — E785 Hyperlipidemia, unspecified: Secondary | ICD-10-CM | POA: Diagnosis present

## 2018-03-06 DIAGNOSIS — Z7901 Long term (current) use of anticoagulants: Secondary | ICD-10-CM

## 2018-03-06 DIAGNOSIS — Z981 Arthrodesis status: Secondary | ICD-10-CM

## 2018-03-06 DIAGNOSIS — G629 Polyneuropathy, unspecified: Secondary | ICD-10-CM | POA: Diagnosis present

## 2018-03-06 DIAGNOSIS — Z7951 Long term (current) use of inhaled steroids: Secondary | ICD-10-CM

## 2018-03-06 DIAGNOSIS — T45515A Adverse effect of anticoagulants, initial encounter: Secondary | ICD-10-CM | POA: Diagnosis present

## 2018-03-06 DIAGNOSIS — R011 Cardiac murmur, unspecified: Secondary | ICD-10-CM | POA: Diagnosis present

## 2018-03-06 DIAGNOSIS — I69351 Hemiplegia and hemiparesis following cerebral infarction affecting right dominant side: Secondary | ICD-10-CM

## 2018-03-06 DIAGNOSIS — Z7982 Long term (current) use of aspirin: Secondary | ICD-10-CM

## 2018-03-06 DIAGNOSIS — E039 Hypothyroidism, unspecified: Secondary | ICD-10-CM | POA: Diagnosis present

## 2018-03-06 DIAGNOSIS — Z953 Presence of xenogenic heart valve: Secondary | ICD-10-CM

## 2018-03-06 DIAGNOSIS — I5032 Chronic diastolic (congestive) heart failure: Secondary | ICD-10-CM | POA: Diagnosis present

## 2018-03-06 DIAGNOSIS — I482 Chronic atrial fibrillation, unspecified: Secondary | ICD-10-CM | POA: Diagnosis present

## 2018-03-06 DIAGNOSIS — N183 Chronic kidney disease, stage 3 unspecified: Secondary | ICD-10-CM | POA: Diagnosis present

## 2018-03-06 DIAGNOSIS — I480 Paroxysmal atrial fibrillation: Secondary | ICD-10-CM | POA: Diagnosis present

## 2018-03-06 DIAGNOSIS — Z79890 Hormone replacement therapy (postmenopausal): Secondary | ICD-10-CM

## 2018-03-06 DIAGNOSIS — W1830XA Fall on same level, unspecified, initial encounter: Secondary | ICD-10-CM | POA: Diagnosis present

## 2018-03-06 DIAGNOSIS — I10 Essential (primary) hypertension: Secondary | ICD-10-CM | POA: Diagnosis present

## 2018-03-06 DIAGNOSIS — N179 Acute kidney failure, unspecified: Secondary | ICD-10-CM | POA: Diagnosis present

## 2018-03-06 DIAGNOSIS — I130 Hypertensive heart and chronic kidney disease with heart failure and stage 1 through stage 4 chronic kidney disease, or unspecified chronic kidney disease: Secondary | ICD-10-CM | POA: Diagnosis present

## 2018-03-06 DIAGNOSIS — S20211A Contusion of right front wall of thorax, initial encounter: Secondary | ICD-10-CM | POA: Diagnosis present

## 2018-03-06 DIAGNOSIS — Z7189 Other specified counseling: Secondary | ICD-10-CM

## 2018-03-06 DIAGNOSIS — G8929 Other chronic pain: Secondary | ICD-10-CM | POA: Diagnosis present

## 2018-03-06 DIAGNOSIS — I252 Old myocardial infarction: Secondary | ICD-10-CM

## 2018-03-06 LAB — COMPREHENSIVE METABOLIC PANEL
ALT: 19 U/L (ref 0–44)
AST: 19 U/L (ref 15–41)
Albumin: 2.9 g/dL — ABNORMAL LOW (ref 3.5–5.0)
Alkaline Phosphatase: 92 U/L (ref 38–126)
Anion gap: 14 (ref 5–15)
BUN: 22 mg/dL (ref 8–23)
CO2: 21 mmol/L — ABNORMAL LOW (ref 22–32)
Calcium: 8.4 mg/dL — ABNORMAL LOW (ref 8.9–10.3)
Chloride: 100 mmol/L (ref 98–111)
Creatinine, Ser: 2.07 mg/dL — ABNORMAL HIGH (ref 0.61–1.24)
GFR calc Af Amer: 36 mL/min — ABNORMAL LOW (ref >60)
GFR, EST NON AFRICAN AMERICAN: 31 mL/min — AB (ref >60)
Glucose, Bld: 122 mg/dL — ABNORMAL HIGH (ref 70–99)
Potassium: 4.3 mmol/L (ref 3.5–5.1)
Sodium: 135 mmol/L (ref 135–145)
Total Bilirubin: 0.9 mg/dL (ref 0.3–1.2)
Total Protein: 7.4 g/dL (ref 6.5–8.1)

## 2018-03-06 LAB — PROTIME-INR
INR: 5.04
INR: 1.11
INR: 1.14
PROTHROMBIN TIME: 45.9 s — AB (ref 11.4–15.2)
Prothrombin Time: 14.2 seconds (ref 11.4–15.2)
Prothrombin Time: 14.5 seconds (ref 11.4–15.2)

## 2018-03-06 LAB — CBC WITH DIFFERENTIAL/PLATELET
Abs Immature Granulocytes: 0.09 10*3/uL — ABNORMAL HIGH (ref 0.00–0.07)
Basophils Absolute: 0 10*3/uL (ref 0.0–0.1)
Basophils Relative: 0 %
Eosinophils Absolute: 0.1 10*3/uL (ref 0.0–0.5)
Eosinophils Relative: 1 %
HCT: 34.8 % — ABNORMAL LOW (ref 39.0–52.0)
Hemoglobin: 11 g/dL — ABNORMAL LOW (ref 13.0–17.0)
Immature Granulocytes: 1 %
Lymphocytes Relative: 8 %
Lymphs Abs: 1.2 10*3/uL (ref 0.7–4.0)
MCH: 30 pg (ref 26.0–34.0)
MCHC: 31.6 g/dL (ref 30.0–36.0)
MCV: 94.8 fL (ref 80.0–100.0)
Monocytes Absolute: 1.3 10*3/uL — ABNORMAL HIGH (ref 0.1–1.0)
Monocytes Relative: 8 %
Neutro Abs: 13.2 10*3/uL — ABNORMAL HIGH (ref 1.7–7.7)
Neutrophils Relative %: 82 %
Platelets: 449 10*3/uL — ABNORMAL HIGH (ref 150–400)
RBC: 3.67 MIL/uL — ABNORMAL LOW (ref 4.22–5.81)
RDW: 14.6 % (ref 11.5–15.5)
WBC: 15.9 10*3/uL — ABNORMAL HIGH (ref 4.0–10.5)
nRBC: 0 % (ref 0.0–0.2)

## 2018-03-06 LAB — URINALYSIS, ROUTINE W REFLEX MICROSCOPIC
BACTERIA UA: NONE SEEN
Bilirubin Urine: NEGATIVE
Glucose, UA: NEGATIVE mg/dL
Ketones, ur: 5 mg/dL — AB
Nitrite: NEGATIVE
Protein, ur: NEGATIVE mg/dL
SPECIFIC GRAVITY, URINE: 1.006 (ref 1.005–1.030)
pH: 6 (ref 5.0–8.0)

## 2018-03-06 LAB — CBC
HEMATOCRIT: 30.1 % — AB (ref 39.0–52.0)
Hemoglobin: 9.5 g/dL — ABNORMAL LOW (ref 13.0–17.0)
MCH: 30.4 pg (ref 26.0–34.0)
MCHC: 31.6 g/dL (ref 30.0–36.0)
MCV: 96.2 fL (ref 80.0–100.0)
Platelets: 351 10*3/uL (ref 150–400)
RBC: 3.13 MIL/uL — ABNORMAL LOW (ref 4.22–5.81)
RDW: 14.6 % (ref 11.5–15.5)
WBC: 18.6 10*3/uL — AB (ref 4.0–10.5)
nRBC: 0 % (ref 0.0–0.2)

## 2018-03-06 LAB — MRSA PCR SCREENING: MRSA by PCR: NEGATIVE

## 2018-03-06 LAB — I-STAT CG4 LACTIC ACID, ED
LACTIC ACID, VENOUS: 1.63 mmol/L (ref 0.5–1.9)
LACTIC ACID, VENOUS: 1.91 mmol/L — AB (ref 0.5–1.9)

## 2018-03-06 LAB — BRAIN NATRIURETIC PEPTIDE: B Natriuretic Peptide: 28.6 pg/mL (ref 0.0–100.0)

## 2018-03-06 LAB — TROPONIN I

## 2018-03-06 MED ORDER — ACETAMINOPHEN 325 MG PO TABS
650.0000 mg | ORAL_TABLET | Freq: Three times a day (TID) | ORAL | Status: DC
  Administered 2018-03-06 – 2018-03-12 (×6): 650 mg via ORAL
  Filled 2018-03-06 (×7): qty 2

## 2018-03-06 MED ORDER — ACETAMINOPHEN 325 MG PO TABS
650.0000 mg | ORAL_TABLET | Freq: Once | ORAL | Status: AC
  Administered 2018-03-06: 650 mg via ORAL
  Filled 2018-03-06: qty 2

## 2018-03-06 MED ORDER — DULOXETINE HCL 60 MG PO CPEP
60.0000 mg | ORAL_CAPSULE | Freq: Every day | ORAL | Status: DC
  Administered 2018-03-06: 60 mg via ORAL
  Filled 2018-03-06: qty 1

## 2018-03-06 MED ORDER — SODIUM CHLORIDE 0.9 % IV BOLUS
500.0000 mL | Freq: Once | INTRAVENOUS | Status: AC
  Administered 2018-03-06: 500 mL via INTRAVENOUS

## 2018-03-06 MED ORDER — CIPROFLOXACIN HCL 500 MG PO TABS
500.0000 mg | ORAL_TABLET | Freq: Two times a day (BID) | ORAL | Status: DC
  Administered 2018-03-06 – 2018-03-07 (×3): 500 mg via ORAL
  Filled 2018-03-06 (×3): qty 1

## 2018-03-06 MED ORDER — SODIUM CHLORIDE 0.9 % IV BOLUS
250.0000 mL | Freq: Once | INTRAVENOUS | Status: AC
  Administered 2018-03-06: 250 mL via INTRAVENOUS

## 2018-03-06 MED ORDER — BISACODYL 5 MG PO TBEC: 5.0000 mg | DELAYED_RELEASE_TABLET | Freq: Every day | ORAL | Status: DC | PRN

## 2018-03-06 MED ORDER — ATORVASTATIN CALCIUM 40 MG PO TABS
40.0000 mg | ORAL_TABLET | Freq: Every day | ORAL | Status: DC
  Administered 2018-03-06: 40 mg via ORAL
  Filled 2018-03-06: qty 1

## 2018-03-06 MED ORDER — FLUTICASONE PROPIONATE 50 MCG/ACT NA SUSP
1.0000 | Freq: Every day | NASAL | Status: DC | PRN
  Filled 2018-03-06: qty 16

## 2018-03-06 MED ORDER — MELATONIN 3 MG PO TABS
4.5000 mg | ORAL_TABLET | Freq: Every day | ORAL | Status: DC
  Administered 2018-03-06 – 2018-03-11 (×4): 4.5 mg via ORAL
  Filled 2018-03-06 (×6): qty 1.5

## 2018-03-06 MED ORDER — MORPHINE SULFATE (PF) 2 MG/ML IV SOLN
2.0000 mg | INTRAVENOUS | Status: DC | PRN
  Administered 2018-03-06 – 2018-03-11 (×7): 2 mg via INTRAVENOUS
  Filled 2018-03-06 (×8): qty 1

## 2018-03-06 MED ORDER — MELATONIN 5 MG PO TABS
5.0000 mg | ORAL_TABLET | Freq: Every day | ORAL | Status: DC
  Filled 2018-03-06: qty 1

## 2018-03-06 MED ORDER — ONDANSETRON HCL 4 MG/2ML IJ SOLN: 4.0000 mg | Freq: Four times a day (QID) | INTRAMUSCULAR | Status: DC | PRN

## 2018-03-06 MED ORDER — VITAMIN K1 10 MG/ML IJ SOLN
10.0000 mg | INTRAVENOUS | Status: AC
  Administered 2018-03-06: 10 mg via INTRAVENOUS
  Filled 2018-03-06: qty 1

## 2018-03-06 MED ORDER — ACETAMINOPHEN 650 MG RE SUPP: 650.0000 mg | Freq: Four times a day (QID) | RECTAL | Status: DC | PRN

## 2018-03-06 MED ORDER — TRAZODONE HCL 50 MG PO TABS
50.0000 mg | ORAL_TABLET | Freq: Every day | ORAL | Status: DC
  Administered 2018-03-06 – 2018-03-11 (×4): 50 mg via ORAL
  Filled 2018-03-06 (×4): qty 1

## 2018-03-06 MED ORDER — PROTHROMBIN COMPLEX CONC HUMAN 500 UNITS IV KIT
3829.0000 [IU] | PACK | Status: AC
  Administered 2018-03-06: 3829 [IU] via INTRAVENOUS
  Filled 2018-03-06: qty 829

## 2018-03-06 MED ORDER — HYDROCODONE-ACETAMINOPHEN 5-325 MG PO TABS
1.0000 | ORAL_TABLET | ORAL | Status: DC | PRN
  Administered 2018-03-06 – 2018-03-12 (×14): 2 via ORAL
  Filled 2018-03-06 (×15): qty 2

## 2018-03-06 MED ORDER — SODIUM CHLORIDE 0.9 % IV SOLN
1.0000 g | Freq: Once | INTRAVENOUS | Status: AC
  Administered 2018-03-06: 1 g via INTRAVENOUS
  Filled 2018-03-06: qty 10

## 2018-03-06 MED ORDER — LEVOTHYROXINE SODIUM 100 MCG PO TABS
100.0000 ug | ORAL_TABLET | Freq: Every day | ORAL | Status: DC
  Administered 2018-03-06 – 2018-03-07 (×2): 100 ug via ORAL
  Filled 2018-03-06 (×2): qty 1

## 2018-03-06 MED ORDER — MORPHINE SULFATE (PF) 2 MG/ML IV SOLN: 2.0000 mg | INTRAVENOUS | Status: DC | PRN

## 2018-03-06 MED ORDER — METOPROLOL TARTRATE 25 MG PO TABS
25.0000 mg | ORAL_TABLET | Freq: Two times a day (BID) | ORAL | Status: DC
  Administered 2018-03-06: 25 mg via ORAL
  Filled 2018-03-06: qty 1

## 2018-03-06 MED ORDER — LACTATED RINGERS IV BOLUS
250.0000 mL | Freq: Once | INTRAVENOUS | Status: AC
  Administered 2018-03-06: 250 mL via INTRAVENOUS

## 2018-03-06 MED ORDER — SODIUM CHLORIDE 0.9 % IV SOLN
INTRAVENOUS | Status: DC | PRN
  Administered 2018-03-06: 500 mL via INTRAVENOUS

## 2018-03-06 MED ORDER — PANTOPRAZOLE SODIUM 40 MG PO TBEC
40.0000 mg | DELAYED_RELEASE_TABLET | Freq: Every day | ORAL | Status: DC
  Administered 2018-03-07: 40 mg via ORAL
  Filled 2018-03-06: qty 1

## 2018-03-06 MED ORDER — ACETAMINOPHEN 325 MG PO TABS
650.0000 mg | ORAL_TABLET | Freq: Four times a day (QID) | ORAL | Status: DC | PRN
  Administered 2018-03-06: 650 mg via ORAL
  Filled 2018-03-06: qty 2

## 2018-03-06 MED ORDER — SODIUM CHLORIDE 0.9% FLUSH
3.0000 mL | Freq: Two times a day (BID) | INTRAVENOUS | Status: DC
  Administered 2018-03-06 – 2018-03-12 (×10): 3 mL via INTRAVENOUS

## 2018-03-06 MED ORDER — POLYETHYLENE GLYCOL 3350 17 GM/SCOOP PO POWD
17.0000 g | Freq: Every day | ORAL | Status: DC
  Administered 2018-03-07 – 2018-03-10 (×2): 17 g via ORAL
  Filled 2018-03-06: qty 255

## 2018-03-06 MED ORDER — LACTATED RINGERS IV SOLN
INTRAVENOUS | Status: DC
  Administered 2018-03-06: 11:00:00 via INTRAVENOUS

## 2018-03-06 MED ORDER — ONDANSETRON HCL 4 MG PO TABS: 4.0000 mg | ORAL_TABLET | Freq: Four times a day (QID) | ORAL | Status: DC | PRN

## 2018-03-06 MED ORDER — TAMSULOSIN HCL 0.4 MG PO CAPS
0.4000 mg | ORAL_CAPSULE | Freq: Every day | ORAL | Status: DC
  Administered 2018-03-06: 0.4 mg via ORAL
  Filled 2018-03-06: qty 1

## 2018-03-06 NOTE — H&P (Signed)
History and Physical    Jorge [name] XBJ:[id number] DOB: [birth year] DOA: 03/06/2018  PCP: [name], PA-C Consultants:  Darrick Penna - vascular; Eden Emms - cardiology; Dohmeier 0 neurology Patient coming from:  Home - lives alone; NOK: Peterson Mathey, son, [phone]  Chief Complaint: Weakness  HPI: Jorge [name] is a 72 y.o. male with medical history significant of TAVR; stage 3 CKD; CVA; OSA; PVD; morbid obesity; HTN: HLD; hypothyroidism; afib on Coumadin; and chronic diastolic CHF presenting with weakness.  He was discharged on 11/23 and he can tell he has been going downhill.  He thinks he will need to go to SNF or Hospice when he leaves the hospital this time.  "If the [facility] wants to call me home, I'm ready to go."  He does not know if he has bene falling.  He was sitting in the recliner and doesn't know if he was taking his medicine properly.  He was losing his strength and was too weak.  He just knows he can't make it by himself anymore.  He has a terrible right-sided headache.  He also has chest wall pain on the right side.  His feet feel funny and his right arm keeps wanting to go to sleep on him and he has some pressure in the right side of his neck.  He was hospitalized from 11/17-23 for severe sepsis secondary to E coli bacteremia with acute pyelonephritis.  He was recommended to discharge to SNF and he refused.  He had acute renal failure during the hospitalization with peak creatinine of 5.2, thought to be related to ATN in the setting of sepsis with hypotension.  ED Course: Recent admission for septic shock requiring ICU care, discharged a week ago.  Today with weakness and decreased UOP.  Unremarkable evaluation other than SDH 1.5 cm and INR 5.  Also with chest wall hematoma.  Neurosurgery consulting.  PCCM evaluated the patient and thinks he does not need ICU care.  Review of Systems: As per HPI; otherwise review of systems reviewed and negative.   Ambulatory Status:   Ambulates with a cane, more recently started using a walker  Past Medical History:  Diagnosis Date  . Anxiety    situational -   . Asthma due to seasonal allergies   . Atrial fibrillation (HCC)    pt on Eliquis  . Chest pain   . CHF (congestive heart failure) (HCC)   . Depression    situational  . Dyspnea    when I walk a long distance  . Dysrhythmia   . Edema   . GERD (gastroesophageal reflux disease)    ocassional  . Heart murmur   . HTN (hypertension)   . Hyperlipidemia   . Hypothyroidism   . Neuropathy   . Obesity   . Peripheral vascular disease (HCC)    carotid   . Renal insufficiency 12/04/2017  . Sleep apnea   . SOB (shortness of breath)   . Stroke [facility])    Right side weakness, using a cane    Past Surgical History:  Procedure Laterality Date  . BACK SURGERY     LUmbar laminectomy  . CARDIAC CATHETERIZATION    . CARDIAC CATHETERIZATION N/A 04/30/2016   Procedure: Right/Left Heart Cath and Coronary Angiography;  Surgeon: Kathleene Hazel, MD;  Location: [facility] INVASIVE CV LAB;  Service: Cardiovascular;  Laterality: N/A;  . CAROTID ANGIOGRAM N/A 05/30/2014   Procedure: Dorise Bullion;  Surgeon: Nada Libman, MD;  Location: [facility] CATH LAB;  Service: Cardiovascular;  Laterality: N/A;  . EYE SURGERY Right    cataract  . EYE SURGERY Left    bioocular lens implanted due injury  . LUMBAR FUSION    . MULTIPLE EXTRACTIONS WITH ALVEOLOPLASTY N/A 05/02/2016   Procedure: Extraction of tooth #'s 7, 10, 23, 24, 25,and 26 with alveoloplasty.;  Surgeon: Charlynne Pander, DDS;  Location: [facility] OR;  Service: Oral Surgery;  Laterality: N/A;  . TEE WITHOUT CARDIOVERSION N/A 05/06/2016   Procedure: TRANSESOPHAGEAL ECHOCARDIOGRAM (TEE);  Surgeon: Kathleene Hazel, MD;  Location: [facility] OR;  Service: Open Heart Surgery;  Laterality: N/A;  . TRANSCAROTID ARTERY REVASCULARIZATION (TCAR)  01/15/2018   TRANSCAROTID ARTERY REVASCULARIZATION LEFT   . TRANSCAROTID ARTERY  REVASCULARIZATION Left 01/15/2018   Procedure: TRANSCAROTID ARTERY REVASCULARIZATION LEFT;  Surgeon: Sherren Kerns, MD;  Location: [facility] OR;  Service: Vascular;  Laterality: Left;  . TRANSCATHETER AORTIC VALVE REPLACEMENT, TRANSFEMORAL N/A 05/06/2016   Procedure: TRANSCATHETER AORTIC VALVE REPLACEMENT, TRANSFEMORAL using a 23mm Edwards Sapien 3 Transcatheter Heart Valve;  Surgeon: Kathleene Hazel, MD;  Location: [facility] OR;  Service: Open Heart Surgery;  Laterality: N/A;    Social History   Socioeconomic History  . Marital status: Divorced    Spouse name: Not on file  . Number of children: Not on file  . Years of education: Not on file  . Highest education level: Not on file  Occupational History  . Not on file  Social Needs  . Financial resource strain: Not on file  . Food insecurity:    Worry: Not on file    Inability: Not on file  . Transportation needs:    Medical: Not on file    Non-medical: Not on file  Tobacco Use  . Smoking status: Former Games developer  . Smokeless tobacco: Never Used  . Tobacco comment: quit early 1980's   Substance and Sexual Activity  . Alcohol use: No  . Drug use: No  . Sexual activity: Not on file  Lifestyle  . Physical activity:    Days per week: Not on file    Minutes per session: Not on file  . Stress: Not on file  Relationships  . Social connections:    Talks on phone: Not on file    Gets together: Not on file    Attends religious service: Not on file    Active member of club or organization: Not on file    Attends meetings of clubs or organizations: Not on file    Relationship status: Not on file  . Intimate partner violence:    Fear of current or ex partner: Not on file    Emotionally abused: Not on file    Physically abused: Not on file    Forced sexual activity: Not on file  Other Topics Concern  . Not on file  Social History Narrative  . Not on file    No Known Allergies  Family History  Problem Relation Age of Onset  .  Heart attack Mother   . Stroke Father     Prior to Admission medications   Medication Sig Start Date End Date Taking? Authorizing Provider  acetaminophen (TYLENOL) 325 MG tablet Take 2 tablets (650 mg total) by mouth every 4 (four) hours as needed for headache or mild pain. 12/05/17  Yes Kathlen Mody, MD  aspirin EC 81 MG tablet Take 81 mg by mouth daily. (0800)   Yes [provider]  atorvastatin (LIPITOR) 40 MG tablet Take 40 mg by mouth  daily at 6 PM.    Yes [provider]  bisacodyl (DULCOLAX) 5 MG EC tablet Take 5 mg by mouth daily as needed for moderate constipation.   Yes [provider]  ciprofloxacin (CIPRO) 500 MG tablet Take 500 mg by mouth 2 (two) times daily. 03/01/18  Yes [provider]  DULoxetine (CYMBALTA) 60 MG capsule Take 60 mg by mouth daily at 6 PM.  08/28/17  Yes [provider]  fluticasone (FLONASE) 50 MCG/ACT nasal spray Place 1 spray into both nostrils daily as needed for allergies.  11/27/17  Yes [provider]  levothyroxine (SYNTHROID, LEVOTHROID) 100 MCG tablet Take 100 mcg by mouth daily at 6 (six) AM.    Yes [provider]  Melatonin 5 MG TABS Take 5 mg by mouth at bedtime. (2100)   Yes [provider]  metoprolol tartrate (LOPRESSOR) 25 MG tablet Take 25 mg by mouth 2 (two) times daily. (0900 & 1700)   Yes [provider]  nitroGLYCERIN (NITROSTAT) 0.4 MG SL tablet Place 1 tablet (0.4 mg total) under the tongue every 5 (five) minutes as needed for chest pain. 12/05/17  Yes Kathlen Mody, MD  omeprazole (PRILOSEC) 40 MG capsule Take 40 mg by mouth daily as needed (indigestion).    Yes [provider]  polyethylene glycol powder (GLYCOLAX/MIRALAX) powder Take 17 g by mouth daily. Patient taking differently: Take 17 g by mouth daily. (0800) 03/01/17  Yes Delo, Riley Lam, MD  tamsulosin (FLOMAX) 0.4 MG CAPS capsule Take 1 capsule (0.4 mg total) by mouth daily after supper.  02/27/18  Yes Leroy Sea, MD  traMADol (ULTRAM) 50 MG tablet Take 100 mg by mouth 3 (three) times daily as needed for moderate pain.    Yes [provider]  warfarin (COUMADIN) 5 MG tablet Take 1.5 tablets (7.5 mg total) by mouth daily. Take as directed by PCP 12/07/17  Yes Kathlen Mody, MD  amiodarone (PACERONE) 200 MG tablet Take 1 tablet (200 mg total) by mouth daily. Patient not taking: Reported on 02/22/2018 07/31/17   Wendall Stade, MD  cephALEXin (KEFLEX) 500 MG capsule Take 1 capsule (500 mg total) by mouth every 8 (eight) hours. Patient not taking: Reported on 03/06/2018 02/27/18   Leroy Sea, MD    Physical Exam: Vitals:   03/06/18 0745 03/06/18 0800 03/06/18 0815 03/06/18 0915  BP: (!) 125/54 (!) 122/51 (!) 125/59 (!) 127/55  Pulse: 79 75 79 78  Resp: (!) 22 (!) 24 20 (!) 22  Temp:      TempSrc:      SpO2: 100% 100% 100% 100%     General:   Appears calm and comfortable and is NAD; he is emotionally labile Eyes:  PERRL on R (has h/o pupillary trauma to L), EOMI, normal lids, iris ENT:  grossly normal hearing, lips & tongue, mmm Neck:  no LAD, masses or thyromegaly Cardiovascular:  RRR, no m/r/g. No LE edema.  Respiratory:   CTA bilaterally with no wheezes/rales/rhonchi.  Normal respiratory effort.  Large R-sided chest wall hematoma.     Abdomen:  soft, mildly diffusely TTP, ND, NABS Skin:  no rash or induration seen on limited exam; hematoma as seen above Musculoskeletal:  grossly normal tone BUE/BLE, good ROM, no bony abnormality Psychiatric:  depressed mood and affect, speech fluent and appropriate, AOx3, emotionally labile Neurologic:  CN 2-12 grossly intact, moves all extremities in coordinated fashion    Radiological Exams on Admission: Ct Abdomen Pelvis Wo Contrast  Result Date: 03/06/2018 CLINICAL DATA:  Initial evaluation for acute blunt trauma. EXAM: CT CHEST, ABDOMEN AND PELVIS WITHOUT CONTRAST TECHNIQUE: Multidetector CT imaging  of the chest, abdomen and pelvis was performed following the standard protocol without IV contrast. COMPARISON:  Prior radiograph from earlier the same day. FINDINGS: CT CHEST FINDINGS Cardiovascular: Limited noncontrast evaluation of the aorta demonstrates no evidence for aneurysm or other obvious acute abnormality. Moderate atheromatous plaque within the aortic arch. Partially visualized great vessels normal in caliber without obvious abnormality. Heart size normal. Prosthetic aortic valve noted. Prominent coronary artery calcifications within the LAD. No pericardial effusion. Main pulmonary artery of normal caliber. Mediastinum/Nodes: Thyroid within normal limits. No enlarged mediastinal, hilar, or axillary lymph nodes identified. No mediastinal hematoma. Esophagus within normal limits. Lungs/Pleura: Tracheobronchial tree intact and patent. Chronic elevation of the right hemidiaphragm. Scattered atelectatic changes seen dependently within the lung bases bilaterally, right greater than left. Patchy multifocal ground-glass opacities involving the peripheral subpleural aspect of the lateral right upper lobe likely reflect pulmonary contusion (series 5, image 38). Additional mild scattered patchy and ground-glass opacities within the peripheral left upper lobe (series 5, image 39), and lingula (series 5, image 84) could reflect atelectasis or possibly small infiltrates. No pneumothorax. No worrisome pulmonary nodule or mass. No pulmonary edema or pleural effusion. Musculoskeletal: Question irregularity with focal angulation at the right scapular wing (series 5, image 16), not entirely certain, as there is motion artifact through this region. No other acute osseous abnormality identified within the thorax. Remotely healed fractures of the right anterolateral eighth and ninth ribs noted. Chronic proximal left humeral fracture partially visualize, grossly similar. Soft tissue hematoma within the subpectoral right  anterolateral chest measures 6.3 x 12.2 x 13.6 cm (series 3, image 22). Additional soft tissue stranding/contusion present within the partially visualized right chest wall. Probable mild contusion within the subcutaneous fat of the mid anterior chest wall superficial to the sternum as well (series 3, image 26). CT ABDOMEN PELVIS FINDINGS Hepatobiliary: Liver demonstrates a normal unenhanced appearance. Gallbladder normal. No biliary dilatation. Pancreas: Pancreas within normal limits. Spleen: Spleen within normal limits. Adrenals/Urinary Tract: Adrenal glands are normal. Kidneys equal in size without nephrolithiasis or hydronephrosis. No appreciable hydroureter. Partially distended bladder within normal limits. Stomach/Bowel: Stomach within normal limits. Small 2.6 cm lipoma noted at the proximal duodenum. No evidence for bowel obstruction or acute bowel injury. No acute inflammatory changes seen about the bowels. Laxity of the right abdominal wall noted. Vascular/Lymphatic: Moderate aorto bi-iliac atherosclerotic disease. No aneurysm. No adenopathy. Reproductive: Prostate within normal limits. Other: Small bilateral fat containing inguinal hernias noted, right greater than left. No free air or fluid no mesenteric or retroperitoneal hematoma. Musculoskeletal: Visualized external soft tissues demonstrate no acute finding. No acute osseous abnormality. Prior interbody fusion at L5-S1. IMPRESSION: 1. Soft tissue hematoma within the subpectoral right anterolateral chest, measuring 6.3 x 12.2 x 13.6 cm, with additional soft tissue stranding/contusion within the partially visualized right chest wall. 2. Probable mild contusion within the subcutaneous fat of the mid anterior chest wall superficial to the sternum. No acute fracture within the thorax. 3. Patchy multifocal ground-glass opacities within the peripheral right upper lobe, most consistent with mild opacities within the left upper lobe and lingula, indeterminate,  but could reflect atelectasis or infiltrates. 4. Additional patchy multifocal 5. Question focal angulation of the right scapular wing, not entirely certain, as there is motion artifact through this region. Correlation with physical exam recommended. Additionally, further assessment with plain film radiography may  be helpful for further evaluation. Electronically Signed   By: Rise MuBenjamin  McClintock M.D.   On: 03/06/2018 06:44   Ct Head Wo Contrast  Result Date: 03/06/2018 CLINICAL DATA:  Head trauma, minor, GCS>=13, high clinical risk, initial exam. Patient called EMS because he was too weak to get out of his chair. EXAM: CT HEAD WITHOUT CONTRAST TECHNIQUE: Contiguous axial images were obtained from the base of the skull through the vertex without intravenous contrast. COMPARISON:  Head CT 02/21/2018 FINDINGS: Brain: Acute undulating right frontoparietal subdural hematoma with internal hypodensities possibly representing clot. Greatest dimension is 13 mm in the frontal region. Lesser degree of hemorrhage tracks into the right temporal lobe. There is no midline shift or mass effect. Generalized atrophy and chronic small vessel ischemia again seen. Vascular: Atherosclerosis of skullbase vasculature without hyperdense vessel or abnormal calcification. Skull: No fracture or focal lesion. Sinuses/Orbits: Paranasal sinuses are clear. Unchanged opacification of lower mastoid air cells bilaterally. No acute orbital abnormality. Other: None. IMPRESSION: 1. Acute moderate-sized right frontoparietal subdural hematoma measuring up to 13 mm in maximal dimension. No midline shift or mass effect. 2. Generalized atrophy and chronic small vessel ischemia. Critical Value/emergent results were called by telephone at the time of interpretation on 03/06/2018 at 6:17 am to Dr. Erma HeritageIsaacs, who verbally acknowledged these results and will relay these findings to Dr. Manus Gunningancour. Electronically Signed   By: Narda RutherfordMelanie  Sanford M.D.   On: 03/06/2018  06:18   Ct Chest Wo Contrast  Result Date: 03/06/2018 CLINICAL DATA:  Initial evaluation for acute blunt trauma. EXAM: CT CHEST, ABDOMEN AND PELVIS WITHOUT CONTRAST TECHNIQUE: Multidetector CT imaging of the chest, abdomen and pelvis was performed following the standard protocol without IV contrast. COMPARISON:  Prior radiograph from earlier the same day. FINDINGS: CT CHEST FINDINGS Cardiovascular: Limited noncontrast evaluation of the aorta demonstrates no evidence for aneurysm or other obvious acute abnormality. Moderate atheromatous plaque within the aortic arch. Partially visualized great vessels normal in caliber without obvious abnormality. Heart size normal. Prosthetic aortic valve noted. Prominent coronary artery calcifications within the LAD. No pericardial effusion. Main pulmonary artery of normal caliber. Mediastinum/Nodes: Thyroid within normal limits. No enlarged mediastinal, hilar, or axillary lymph nodes identified. No mediastinal hematoma. Esophagus within normal limits. Lungs/Pleura: Tracheobronchial tree intact and patent. Chronic elevation of the right hemidiaphragm. Scattered atelectatic changes seen dependently within the lung bases bilaterally, right greater than left. Patchy multifocal ground-glass opacities involving the peripheral subpleural aspect of the lateral right upper lobe likely reflect pulmonary contusion (series 5, image 38). Additional mild scattered patchy and ground-glass opacities within the peripheral left upper lobe (series 5, image 39), and lingula (series 5, image 84) could reflect atelectasis or possibly small infiltrates. No pneumothorax. No worrisome pulmonary nodule or mass. No pulmonary edema or pleural effusion. Musculoskeletal: Question irregularity with focal angulation at the right scapular wing (series 5, image 16), not entirely certain, as there is motion artifact through this region. No other acute osseous abnormality identified within the thorax. Remotely  healed fractures of the right anterolateral eighth and ninth ribs noted. Chronic proximal left humeral fracture partially visualize, grossly similar. Soft tissue hematoma within the subpectoral right anterolateral chest measures 6.3 x 12.2 x 13.6 cm (series 3, image 22). Additional soft tissue stranding/contusion present within the partially visualized right chest wall. Probable mild contusion within the subcutaneous fat of the mid anterior chest wall superficial to the sternum as well (series 3, image 26). CT ABDOMEN PELVIS FINDINGS Hepatobiliary: Liver demonstrates a normal unenhanced appearance.  Gallbladder normal. No biliary dilatation. Pancreas: Pancreas within normal limits. Spleen: Spleen within normal limits. Adrenals/Urinary Tract: Adrenal glands are normal. Kidneys equal in size without nephrolithiasis or hydronephrosis. No appreciable hydroureter. Partially distended bladder within normal limits. Stomach/Bowel: Stomach within normal limits. Small 2.6 cm lipoma noted at the proximal duodenum. No evidence for bowel obstruction or acute bowel injury. No acute inflammatory changes seen about the bowels. Laxity of the right abdominal wall noted. Vascular/Lymphatic: Moderate aorto bi-iliac atherosclerotic disease. No aneurysm. No adenopathy. Reproductive: Prostate within normal limits. Other: Small bilateral fat containing inguinal hernias noted, right greater than left. No free air or fluid no mesenteric or retroperitoneal hematoma. Musculoskeletal: Visualized external soft tissues demonstrate no acute finding. No acute osseous abnormality. Prior interbody fusion at L5-S1. IMPRESSION: 1. Soft tissue hematoma within the subpectoral right anterolateral chest, measuring 6.3 x 12.2 x 13.6 cm, with additional soft tissue stranding/contusion within the partially visualized right chest wall. 2. Probable mild contusion within the subcutaneous fat of the mid anterior chest wall superficial to the sternum. No acute  fracture within the thorax. 3. Patchy multifocal ground-glass opacities within the peripheral right upper lobe, most consistent with mild opacities within the left upper lobe and lingula, indeterminate, but could reflect atelectasis or infiltrates. 4. Additional patchy multifocal 5. Question focal angulation of the right scapular wing, not entirely certain, as there is motion artifact through this region. Correlation with physical exam recommended. Additionally, further assessment with plain film radiography may be helpful for further evaluation. Electronically Signed   By: Rise Mu M.D.   On: 03/06/2018 06:44   Dg Chest Portable 1 View  Result Date: 03/06/2018 CLINICAL DATA:  Shortness of breath. EXAM: PORTABLE CHEST 1 VIEW COMPARISON:  Radiographs 02/22/2018 FINDINGS: Chronic elevation of right hemidiaphragm. Improved pulmonary edema from prior exam. Decreased blunting of right costophrenic angle likely improving pleural effusion. Unchanged heart size and mediastinal contours. No pneumothorax. Unchanged osseous structures. IMPRESSION: Improved pulmonary edema from prior exam. Decreased right pleural effusion. Chronic elevation of right hemidiaphragm. Electronically Signed   By: Narda Rutherford M.D.   On: 03/06/2018 05:29    EKG: Independently reviewed.  NSR with rate 68; LVH; nonspecific ST changes; prolonged Qtc 522   Labs on Admission: I have personally reviewed the available labs and imaging studies at the time of the admission.  Pertinent labs:   CO2 21 Glucose 122 BUN 22/Creatinine 2.07/GFR 31 - improved from 11/23 but still above baseline of 1.2-1.3 Albumin 2.9 BNP 28.6 Troponin <0.03 Lactate 1.63 WBC 15.9 - stable Hgb 11.0 - stable Platelets 449 INR 5.04 -> 1.11 UA: large Hgb, 5 ketones, small LE Blood and urine cultures pending  Assessment/Plan Principal Problem:   SDH (subdural hematoma) (HCC) Active Problems:   HLD (hyperlipidemia)   Essential hypertension    Paroxysmal atrial fibrillation (HCC)   Chronic intermittent hypoxia with obstructive sleep apnea   Anticoagulated on warfarin   Acute renal failure superimposed on stage 3 chronic kidney disease (HCC)   Chest wall hematoma, right, initial encounter   Goals of care, counseling/discussion   Subdural hematoma/chest wall hematoma  -Patient with recent hospitalization, recommended to go to SNF at the time of d/c -He is quite weak and has been failing to effectively care for himself at home, including medication misadventures -He is uncertain whether he has fallen, although this seems likely -He does not have any obvious scalp injuries but CT head showed a moderate subdural hematoma -He has been on ASA and is supratherapeutic on  his Coumadin -He needs Coumadin for h/o TAVR, recent TCAR stenting of the L carotid artery (01/23/18), and afib -Rapid reversal protocol was utilized and his INR has normalized but will continue to be followed as per the protocol -Will observe overnight in neuro SDU -Repeat CT at 12 hours, as per Dr. Wynetta Emery -If any evolution of bleed, needs repeat neurosurgery consult vs. Comfort care measures -If no evolution, will monitor overnight and anticipate d/c to home tomorrow - pending other issues including placement  Afib, on Coumadin, supratherapeutic -He is rate controlled with Lopressor, will continue -Hold Coumadin and will need to carefully consider whether to resume - pending discussions with palliative care  Acute renal failure on stage 3 CKD -This is actually improved from prior hospitalization and so may be reflective of ongoing resolution of prior insult or could be recurrence -Will trend with gentle IVF hydration at this time  HTN -Continue Lopressor for now  HLD -Continue Lipitor for now  OSA -Hold CPAP in the setting of SDH  Goals of care -The patient is currently very sad, as he is coping with loss of independence - he was recommended to go to SNF  after last hospitalization but opted not to go and yet has been unsuccessful at home -He now recognizes that he will need SNF placement and instead currently states that he is ready to go be with the [facility] -He is planning to call in his family and his preacher -He has requested a hospice consult so that he can go to residential hospice, but it is not clear that he is actively dying and so this may not be appropriate -For now, palliative care consultation is probably more appropriate than hospice and so will order   DVT prophylaxis:  SCDs Code Status:  DNR - confirmed with patient Family Communication: None present Disposition Plan: To be determined Consults called: Neurosurgery; PCCM; palliative care; SW  Admission status: It is my clinical opinion that referral for OBSERVATION is reasonable and necessary in this patient based on the above information provided. The aforementioned taken together are felt to place the patient at high risk for further clinical deterioration. However it is anticipated that the patient may be medically stable for discharge from the hospital within 24 to 48 hours.    Jonah Blue MD [facility]  If note is complete, please contact covering daytime or nighttime physician. [email or web address] Password [email or web address]  03/06/2018, 9:50 AM

## 2018-03-06 NOTE — Consult Note (Signed)
NAME:  Jorge [name], MRN:  [id number], DOB:  December 29, 1945, LOS: 0 ADMISSION DATE:  03/06/2018, CONSULTATION DATE:  11/30 REFERRING MD:  Rancour, CHIEF COMPLAINT:  SDH    History of present illness   72 year old male patient with history of TAVR back in 2018, also with chronic atrial fibrillation on Coumadin.  He was just recently discharged from the hospital following sepsis in setting of urinary tract source on 11/23.  He reported progressive weakness since the time of discharge, also urinary hesitancy.  He reports he had spoken with his primary provider who felt was likely still infected, was restarted on another antibiotic he does not know what.  He called EMS this a.m. on 11/30 as he could not get out of his chair, also had a headache and right arm discomfort in addition to the progressive weakness mentioned above.  He did not recall falling however had a large area of ecchymosis on his right chest.  Diagnostic evaluation in the emergency room demonstrated a large hematoma on his right chest as well as a new acute right frontal parietal subdural hematoma.  His INR was 5.04 at the time.  He was seen by neurosurgery, he was deemed not an operative candidate, he was given Kcentra and vitamin K, critical care asked to evaluate.  During discussion the patient shared with me his frustration about his continued decline.  He had recently refused going to a nursing home but now lysing he can no longer take care of himself.  He is interested in hospice.  He is been very clear he does not want escalation of care, CPR, or any advanced interventions as he is certain this would further his need for assistance with all ADLs   Significant Hospital Events   CODE STATUS verified, DO NOT RESUSCITATE DO NOT INTUBATE  Consults:  Critical care Neurosurgery  Procedures:    Significant Diagnostic Tests:  CT head: Acute moderate sized frontoparietal subdural hematoma, generalized atrophy CT chest  ; Tissue  hematoma within the subpectoral right anterolateral chest.  Probable mild contusion within the subcu fat on the mid anterior chest wall patchy multifocal groundglass opacities right upper lobe question atelectasis versus infiltrate  Micro Data:  Urine culture 11/30  Antimicrobials:  Ceftriaxone 11/30 Interim history/subjective:  And move right arm well due to pain  Objective   Blood pressure (Abnormal) 125/59, pulse 79, temperature 98.2 F (36.8 C), temperature source Oral, resp. rate 20, SpO2 100 %.       No intake or output data in the 24 hours ending 03/06/18 0910 There were no vitals filed for this visit.  Examination: General: Debilitated 72 year old white male currently resting in bed HENT: Normal cephalic atraumatic mucous membranes dry Lungs: Diminished bases no accessory use Cardiovascular: Regular irregular, atrial fibrillation on telemetry, large ecchymotic area on right anterior, lateral chest wall Abdomen: Soft, nontender, positive bowel sounds Extremities: Dry warm, no edema Neuro: Awake, oriented, no focal deficits, complains of right-sided headache GU: Due to void  Assessment & Plan:   Acute likely traumatic right subdural hematoma Right anterior chest wall hematoma Coumadin induced coagulopathy Possible urinary tract infection History of TAVR History of diastolic heart failure History of atrial fibrillation  Anemia of chronic disease AKI Failure to thrive Obstructive sleep apnea Severe deconditioning  Discussion 72 year old male with multiple medical problems.  Just discharged after an admission for sepsis in setting of urinary tract source.  He now presents with what is likely a traumatic subdural hematoma, also anterior chest  hematoma.  He does not recall fall but thinks this could be a possibility.  He confided in me that he has difficulty keeping up with his medications, often times thinks he does not take them correctly.  He does not think he can care  for himself at home anymore although his desire was to not go to a nursing home.  He is most keenly interested in hospice at this point.  We talked extensively about goals of care, he had been a DNR in the past, I confirm this once again.  Plan/recommendation Continue to hold anticoagulation Follow-up coagulopathy studies following Kcentra and vitamin K Admit  under internal medicine Repeat imaging per neurosurgery Urine culture Antibiotics per internal medicine Recommend palliative care consult  Critical care will sign off no indication for intensive care services in the setting   Labs   CBC: Recent Labs  Lab 03/06/18 0447  WBC 15.9*  NEUTROABS 13.2*  HGB 11.0*  HCT 34.8*  MCV 94.8  PLT 449*    Basic Metabolic Panel: Recent Labs  Lab 03/06/18 0447  NA 135  K 4.3  CL 100  CO2 21*  GLUCOSE 122*  BUN 22  CREATININE 2.07*  CALCIUM 8.4*   GFR: Estimated Creatinine Clearance: 47.4 mL/min (A) (by C-G formula based on SCr of 2.07 mg/dL (H)). Recent Labs  Lab 03/06/18 0447 03/06/18 0448 03/06/18 0857  WBC 15.9*  --   --   LATICACIDVEN  --  1.63 1.91*    Liver Function Tests: Recent Labs  Lab 03/06/18 0447  AST 19  ALT 19  ALKPHOS 92  BILITOT 0.9  PROT 7.4  ALBUMIN 2.9*   No results for input(s): LIPASE, AMYLASE in the last 168 hours. No results for input(s): AMMONIA in the last 168 hours.  ABG    Component Value Date/Time   PHART 7.358 02/21/2018 1216   PCO2ART 32.7 02/21/2018 1216   PO2ART 84.0 02/21/2018 1216   HCO3 18.5 (L) 02/21/2018 1216   TCO2 19 (L) 02/21/2018 1216   ACIDBASEDEF 6.0 (H) 02/21/2018 1216   O2SAT 96.0 02/21/2018 1216     Coagulation Profile: Recent Labs  Lab 03/06/18 0447  INR 5.04*    Cardiac Enzymes: Recent Labs  Lab 03/06/18 0447  TROPONINI <0.03    HbA1C: Hgb A1c MFr Bld  Date/Time Value Ref Range Status  12/12/2017 05:25 PM 6.5 (H) 4.8 - 5.6 % Final    Comment:    (NOTE) Pre diabetes:           5.7%-6.4% Diabetes:              >6.4% Glycemic control for   <7.0% adults with diabetes   05/25/2014 08:44 AM 5.7 (H) 4.8 - 5.6 % Final    Comment:    (NOTE)         Pre-diabetes: 5.7 - 6.4         Diabetes: >6.4         Glycemic control for adults with diabetes: <7.0     CBG: No results for input(s): GLUCAP in the last 168 hours.  Review of Systems:   Review of Systems  Constitutional: Positive for malaise/fatigue. Negative for chills, diaphoresis, fever and weight loss.  HENT: Positive for ear pain. Negative for ear discharge, hearing loss and tinnitus.   Eyes: Negative.   Respiratory: Negative.   Cardiovascular: Negative.   Gastrointestinal: Negative.   Genitourinary: Positive for dysuria, flank pain and urgency.  Musculoskeletal: Positive for falls.  Neurological: Positive  for sensory change, focal weakness and headaches.  Endo/Heme/Allergies: Bruises/bleeds easily.  Psychiatric/Behavioral: Negative.      Past Medical History  He,  has a past medical history of Anxiety, Asthma due to seasonal allergies, Atrial fibrillation (HCC), Chest pain, CHF (congestive heart failure) (HCC), Depression, Dyspnea, Dysrhythmia, Edema, GERD (gastroesophageal reflux disease), Heart murmur, HTN (hypertension), Hyperlipidemia, Hypothyroidism, Neuropathy, Obesity, Peripheral vascular disease (HCC), Renal insufficiency (12/04/2017), Sleep apnea, SOB (shortness of breath), and Stroke (HCC).   Surgical History    Past Surgical History:  Procedure Laterality Date  . BACK SURGERY     LUmbar laminectomy  . CARDIAC CATHETERIZATION    . CARDIAC CATHETERIZATION N/A 04/30/2016   Procedure: Right/Left Heart Cath and Coronary Angiography;  Surgeon: Kathleene Hazel, MD;  Location: [facility] INVASIVE CV LAB;  Service: Cardiovascular;  Laterality: N/A;  . CAROTID ANGIOGRAM N/A 05/30/2014   Procedure: Dorise Bullion;  Surgeon: Nada Libman, MD;  Location: [facility] CATH LAB;  Service: Cardiovascular;   Laterality: N/A;  . EYE SURGERY Right    cataract  . EYE SURGERY Left    bioocular lens implanted due injury  . LUMBAR FUSION    . MULTIPLE EXTRACTIONS WITH ALVEOLOPLASTY N/A 05/02/2016   Procedure: Extraction of tooth #'s 7, 10, 23, 24, 25,and 26 with alveoloplasty.;  Surgeon: Charlynne Pander, DDS;  Location: [facility] OR;  Service: Oral Surgery;  Laterality: N/A;  . TEE WITHOUT CARDIOVERSION N/A 05/06/2016   Procedure: TRANSESOPHAGEAL ECHOCARDIOGRAM (TEE);  Surgeon: Kathleene Hazel, MD;  Location: [facility] OR;  Service: Open Heart Surgery;  Laterality: N/A;  . TRANSCAROTID ARTERY REVASCULARIZATION (TCAR)  01/15/2018   TRANSCAROTID ARTERY REVASCULARIZATION LEFT   . TRANSCAROTID ARTERY REVASCULARIZATION Left 01/15/2018   Procedure: TRANSCAROTID ARTERY REVASCULARIZATION LEFT;  Surgeon: Sherren Kerns, MD;  Location: [facility] OR;  Service: Vascular;  Laterality: Left;  . TRANSCATHETER AORTIC VALVE REPLACEMENT, TRANSFEMORAL N/A 05/06/2016   Procedure: TRANSCATHETER AORTIC VALVE REPLACEMENT, TRANSFEMORAL using a 23mm Edwards Sapien 3 Transcatheter Heart Valve;  Surgeon: Kathleene Hazel, MD;  Location: [facility] OR;  Service: Open Heart Surgery;  Laterality: N/A;     Social History   reports that he has quit smoking. He has never used smokeless tobacco. He reports that he does not drink alcohol or use drugs.   Family History   His family history includes Heart attack in his mother; Stroke in his father.   Allergies No Known Allergies   Home Medications  Prior to Admission medications   Medication Sig Start Date End Date Taking? Authorizing Provider  acetaminophen (TYLENOL) 325 MG tablet Take 2 tablets (650 mg total) by mouth every 4 (four) hours as needed for headache or mild pain. 12/05/17  Yes Kathlen Mody, MD  aspirin EC 81 MG tablet Take 81 mg by mouth daily. (0800)   Yes [provider]  atorvastatin (LIPITOR) 40 MG tablet Take 40 mg by mouth daily at 6 PM.    Yes [provider]  bisacodyl (DULCOLAX) 5 MG EC tablet Take 5 mg by mouth daily as needed for moderate constipation.   Yes [provider]  ciprofloxacin (CIPRO) 500 MG tablet Take 500 mg by mouth 2 (two) times daily. 03/01/18  Yes [provider]  DULoxetine (CYMBALTA) 60 MG capsule Take 60 mg by mouth daily at 6 PM.  08/28/17  Yes [provider]  fluticasone (FLONASE) 50 MCG/ACT nasal spray Place 1 spray into both nostrils daily as needed for allergies.  11/27/17  Yes [provider]  levothyroxine (SYNTHROID, LEVOTHROID) 100 MCG tablet Take 100 mcg by mouth daily at 6 (six) AM.    Yes [provider]  Melatonin 5 MG TABS Take 5 mg by mouth at bedtime. (2100)   Yes [provider]  metoprolol tartrate (LOPRESSOR) 25 MG tablet Take 25 mg by mouth 2 (two) times daily. (0900 & 1700)   Yes [provider]  nitroGLYCERIN (NITROSTAT) 0.4 MG SL tablet Place 1 tablet (0.4 mg total) under the tongue every 5 (five) minutes as needed for chest pain. 12/05/17  Yes Kathlen Mody, MD  omeprazole (PRILOSEC) 40 MG capsule Take 40 mg by mouth daily as needed (indigestion).    Yes [provider]  polyethylene glycol powder (GLYCOLAX/MIRALAX) powder Take 17 g by mouth daily. Patient taking differently: Take 17 g by mouth daily. (0800) 03/01/17  Yes Delo, Riley Lam, MD  tamsulosin (FLOMAX) 0.4 MG CAPS capsule Take 1 capsule (0.4 mg total) by mouth daily after supper. 02/27/18  Yes Leroy Sea, MD  traMADol (ULTRAM) 50 MG tablet Take 100 mg by mouth 3 (three) times daily as needed for moderate pain.    Yes [provider]  warfarin (COUMADIN) 5 MG tablet Take 1.5 tablets (7.5 mg total) by mouth daily. Take as directed by PCP 12/07/17  Yes Kathlen Mody, MD  amiodarone (PACERONE) 200 MG tablet Take 1 tablet (200 mg total) by mouth daily. Patient not taking: Reported on 02/22/2018 07/31/17   Wendall Stade, MD  cephALEXin (KEFLEX) 500 MG  capsule Take 1 capsule (500 mg total) by mouth every 8 (eight) hours. Patient not taking: Reported on 03/06/2018 02/27/18   Leroy Sea, MD      Simonne Martinet ACNP-BC [facility] Pulmonary/Critical Care Pager # [phone] OR # [phone] if no answer

## 2018-03-06 NOTE — ED Triage Notes (Addendum)
Pt called EMS b/c he was too weak to get out of chair.  States he hurt his right arm getting out of the chair.  Pt reports continued pain w/ urination and decreased urine output.  CBG 150.  Pt has a large purple bruise on his abdomen on the right side.

## 2018-03-06 NOTE — Social Work (Signed)
CSW acknowledging consult for SNF placement.  Will follow for therapy recommendations. Per last admission CSW note: "CSW met with patient and his ex-wife at bedside. CSW explained that since patient had recently discharged from SNF and has not had his Medicare days start over, he only has 8 days of SNF left at 100%. Patient is refusing SNF and his ex-wife states she will fill in where home health cannot. Will consult RNCM for home health servces."   Westley Hummer, MSW, [place] [facility] [phone]

## 2018-03-06 NOTE — ED Notes (Signed)
Attempted to give report, receiving unit stated that Charge RN reports their 2 rooms are closed, give charge a few minutes to see if they will be accepting the patient.

## 2018-03-06 NOTE — Progress Notes (Signed)
Patient arrived from ED, vitals stable, oriented x4, oriented to unit. Belongings at bedside.  DNR bracelet placed on pt. Continue to monitor.

## 2018-03-06 NOTE — ED Notes (Signed)
Will collect INR x1 hour

## 2018-03-06 NOTE — Consult Note (Signed)
Reason for Consult:subdural hematoma Referring Physician: emergency department  Jorge [name] is an 72 y.o. male.  HPI: 72 year old Jorge [name] with a long, located past medical history remarkable for chronic kidney disease peripheral vascular disease coronary artery disease status post aortic valve replacement on Coumadin presents with being found in his chair with weakness CT scan performed on arrival hospital shows a relatively small focal right frontal acute subdural hematoma with minimal to no mass effect. Patient's INR is 5.0. Currently the patient is complaining of headache but denies any numbness tingling in his arms or his legs reports generalized weakness but was recently discharged hospital less than a week ago for management of sepsis with primary kidney and urinary tract infection.  Past Medical History:  Diagnosis Date  . Anxiety    situational -   . Asthma due to seasonal allergies   . Atrial fibrillation (HCC)    pt on Eliquis  . Chest pain   . CHF (congestive heart failure) (HCC)   . Depression    situational  . Dyspnea    when I walk a long distance  . Dysrhythmia   . Edema   . GERD (gastroesophageal reflux disease)    ocassional  . Heart murmur   . HTN (hypertension)   . Hyperlipidemia   . Hypothyroidism   . Neuropathy   . Obesity   . Peripheral vascular disease (HCC)    carotid   . Renal insufficiency 12/04/2017  . Sleep apnea   . SOB (shortness of breath)   . Stroke [facility])    Right side weakness, using a cane    Past Surgical History:  Procedure Laterality Date  . BACK SURGERY     LUmbar laminectomy  . CARDIAC CATHETERIZATION    . CARDIAC CATHETERIZATION N/A 04/30/2016   Procedure: Right/Left Heart Cath and Coronary Angiography;  Surgeon: Kathleene Hazel, MD;  Location: [facility] INVASIVE CV LAB;  Service: Cardiovascular;  Laterality: N/A;  . CAROTID ANGIOGRAM N/A 05/30/2014   Procedure: Dorise Bullion;  Surgeon: Nada Libman, MD;  Location[facility]  CATH LAB;  Service: Cardiovascular;  Laterality: N/A;  . EYE SURGERY Right    cataract  . EYE SURGERY Left    bioocular lens implanted due injury  . LUMBAR FUSION    . MULTIPLE EXTRACTIONS WITH ALVEOLOPLASTY N/A 05/02/2016   Procedure: Extraction of tooth #'s 7, 10, 23, 24, 25,and 26 with alveoloplasty.;  Surgeon: Charlynne Pander, DDS;  Location: [facility] OR;  Service: Oral Surgery;  Laterality: N/A;  . TEE WITHOUT CARDIOVERSION N/A 05/06/2016   Procedure: TRANSESOPHAGEAL ECHOCARDIOGRAM (TEE);  Surgeon: Kathleene Hazel, MD;  Location: [facility] OR;  Service: Open Heart Surgery;  Laterality: N/A;  . TRANSCAROTID ARTERY REVASCULARIZATION (TCAR)  01/15/2018   TRANSCAROTID ARTERY REVASCULARIZATION LEFT   . TRANSCAROTID ARTERY REVASCULARIZATION Left 01/15/2018   Procedure: TRANSCAROTID ARTERY REVASCULARIZATION LEFT;  Surgeon: Sherren Kerns, MD;  Location: [facility] OR;  Service: Vascular;  Laterality: Left;  . TRANSCATHETER AORTIC VALVE REPLACEMENT, TRANSFEMORAL N/A 05/06/2016   Procedure: TRANSCATHETER AORTIC VALVE REPLACEMENT, TRANSFEMORAL using a 23mm Edwards Sapien 3 Transcatheter Heart Valve;  Surgeon: Kathleene Hazel, MD;  Location: [facility] OR;  Service: Open Heart Surgery;  Laterality: N/A;    Family History  Problem Relation Age of Onset  . Heart attack Mother   . Stroke Father     Social History:  reports that he has quit smoking. He has never used smokeless tobacco. He reports that he does not drink alcohol  or use drugs.  Allergies: No Known Allergies  Medications: I have reviewed the patient's current medications.  Results for orders placed or performed during the hospital encounter of 03/06/18 (from the past 48 hour(s))  Blood culture (routine x 2)     Status: None (Preliminary result)   Collection Time: 03/06/18  4:30 AM  Result Value Ref Range   Specimen Description BLOOD RIGHT ARM    Special Requests      BOTTLES DRAWN AEROBIC ONLY Blood Culture results may not be optimal due  to an excessive volume of blood received in culture bottles   Culture      NO GROWTH <12 HOURS Performed at [facility], [address] [place], [place] [address]    Report Status PENDING   Blood culture (routine x 2)     Status: None (Preliminary result)   Collection Time: 03/06/18  4:45 AM  Result Value Ref Range   Specimen Description BLOOD LEFT ARM    Special Requests      BOTTLES DRAWN AEROBIC AND ANAEROBIC Blood Culture results may not be optimal due to an excessive volume of blood received in culture bottles   Culture      NO GROWTH <12 HOURS Performed at [facility], [address] [place], [place] [address]    Report Status PENDING   CBC with Differential/Platelet     Status: Abnormal   Collection Time: 03/06/18  4:47 AM  Result Value Ref Range   WBC 15.9 (H) 4.0 - 10.5 K/uL   RBC 3.67 (L) 4.22 - 5.81 MIL/uL   Hemoglobin 11.0 (L) 13.0 - 17.0 g/dL   HCT 09.8 (L) 11.9 - 14.7 %   MCV 94.8 80.0 - 100.0 fL   MCH 30.0 26.0 - 34.0 pg   MCHC 31.6 30.0 - 36.0 g/dL   RDW 82.9 56.2 - 13.0 %   Platelets 449 (H) 150 - 400 K/uL   nRBC 0.0 0.0 - 0.2 %   Neutrophils Relative % 82 %   Neutro Abs 13.2 (H) 1.7 - 7.7 K/uL   Lymphocytes Relative 8 %   Lymphs Abs 1.2 0.7 - 4.0 K/uL   Monocytes Relative 8 %   Monocytes Absolute 1.3 (H) 0.1 - 1.0 K/uL   Eosinophils Relative 1 %   Eosinophils Absolute 0.1 0.0 - 0.5 K/uL   Basophils Relative 0 %   Basophils Absolute 0.0 0.0 - 0.1 K/uL   Immature Granulocytes 1 %   Abs Immature Granulocytes 0.09 (H) 0.00 - 0.07 K/uL    Comment: Performed at [facility], [address], [place], [place] [address]  Comprehensive metabolic panel     Status: Abnormal   Collection Time: 03/06/18  4:47 AM  Result Value Ref Range   Sodium 135 135 - 145 mmol/L   Potassium 4.3 3.5 - 5.1 mmol/L   Chloride 100 98 - 111 mmol/L   CO2 21 (L) 22 - 32 mmol/L   Glucose, Bld 122 (H) 70 - 99 mg/dL   BUN 22 8 - 23 mg/dL   Creatinine, Ser 4.69 (H)  0.61 - 1.24 mg/dL   Calcium 8.4 (L) 8.9 - 10.3 mg/dL   Total Protein 7.4 6.5 - 8.1 g/dL   Albumin 2.9 (L) 3.5 - 5.0 g/dL   AST 19 15 - 41 U/L   ALT 19 0 - 44 U/L   Alkaline Phosphatase 92 38 - 126 U/L   Total Bilirubin 0.9 0.3 - 1.2 mg/dL  GFR calc non Af Amer 31 (L) >60 mL/min   GFR calc Af Amer 36 (L) >60 mL/min   Anion gap 14 5 - 15    Comment: Performed at [facility] Lab, [address], [place], [place] [address]  Troponin I - ONCE - STAT     Status: None   Collection Time: 03/06/18  4:47 AM  Result Value Ref Range   Troponin I <0.03 <0.03 ng/mL    Comment: Performed at [facility], [address] [place], [place] [address]  Brain natriuretic peptide     Status: None   Collection Time: 03/06/18  4:47 AM  Result Value Ref Range   B Natriuretic Peptide 28.6 0.0 - 100.0 pg/mL    Comment: Performed at [facility], [address] [place], [place] [address]  Protime-INR     Status: Abnormal   Collection Time: 03/06/18  4:47 AM  Result Value Ref Range   Prothrombin Time 45.9 (H) 11.4 - 15.2 seconds   INR 5.04 (HH)     Comment: REPEATED TO VERIFY CRITICAL RESULT CALLED TO, READ BACK BY AND VERIFIED WITHFaythe Dingwall RN [phone] [id number] SHORTT Performed at [facility], [address], [place], [place] [address]   I-Stat CG4 Lactic Acid, ED     Status: None   Collection Time: 03/06/18  4:48 AM  Result Value Ref Range   Lactic Acid, Venous 1.63 0.5 - 1.9 mmol/L    Ct Abdomen Pelvis Wo Contrast  Result Date: 03/06/2018 CLINICAL DATA:  Initial evaluation for acute blunt trauma. EXAM: CT CHEST, ABDOMEN AND PELVIS WITHOUT CONTRAST TECHNIQUE: Multidetector CT imaging of the chest, abdomen and pelvis was performed following the standard protocol without IV contrast. COMPARISON:  Prior radiograph from earlier the same day. FINDINGS: CT CHEST FINDINGS Cardiovascular: Limited noncontrast evaluation of the aorta demonstrates no evidence for aneurysm or other obvious  acute abnormality. Moderate atheromatous plaque within the aortic arch. Partially visualized great vessels normal in caliber without obvious abnormality. Heart size normal. Prosthetic aortic valve noted. Prominent coronary artery calcifications within the LAD. No pericardial effusion. Main pulmonary artery of normal caliber. Mediastinum/Nodes: Thyroid within normal limits. No enlarged mediastinal, hilar, or axillary lymph nodes identified. No mediastinal hematoma. Esophagus within normal limits. Lungs/Pleura: Tracheobronchial tree intact and patent. Chronic elevation of the right hemidiaphragm. Scattered atelectatic changes seen dependently within the lung bases bilaterally, right greater than left. Patchy multifocal ground-glass opacities involving the peripheral subpleural aspect of the lateral right upper lobe likely reflect pulmonary contusion (series 5, image 38). Additional mild scattered patchy and ground-glass opacities within the peripheral left upper lobe (series 5, image 39), and lingula (series 5, image 84) could reflect atelectasis or possibly small infiltrates. No pneumothorax. No worrisome pulmonary nodule or mass. No pulmonary edema or pleural effusion. Musculoskeletal: Question irregularity with focal angulation at the right scapular wing (series 5, image 16), not entirely certain, as there is motion artifact through this region. No other acute osseous abnormality identified within the thorax. Remotely healed fractures of the right anterolateral eighth and ninth ribs noted. Chronic proximal left humeral fracture partially visualize, grossly similar. Soft tissue hematoma within the subpectoral right anterolateral chest measures 6.3 x 12.2 x 13.6 cm (series 3, image 22). Additional soft tissue stranding/contusion present within the partially visualized right chest wall. Probable mild contusion within the subcutaneous fat of the mid anterior chest wall superficial to the sternum as well (series 3,  image 26). CT ABDOMEN  PELVIS FINDINGS Hepatobiliary: Liver demonstrates a normal unenhanced appearance. Gallbladder normal. No biliary dilatation. Pancreas: Pancreas within normal limits. Spleen: Spleen within normal limits. Adrenals/Urinary Tract: Adrenal glands are normal. Kidneys equal in size without nephrolithiasis or hydronephrosis. No appreciable hydroureter. Partially distended bladder within normal limits. Stomach/Bowel: Stomach within normal limits. Small 2.6 cm lipoma noted at the proximal duodenum. No evidence for bowel obstruction or acute bowel injury. No acute inflammatory changes seen about the bowels. Laxity of the right abdominal wall noted. Vascular/Lymphatic: Moderate aorto bi-iliac atherosclerotic disease. No aneurysm. No adenopathy. Reproductive: Prostate within normal limits. Other: Small bilateral fat containing inguinal hernias noted, right greater than left. No free air or fluid no mesenteric or retroperitoneal hematoma. Musculoskeletal: Visualized external soft tissues demonstrate no acute finding. No acute osseous abnormality. Prior interbody fusion at L5-S1. IMPRESSION: 1. Soft tissue hematoma within the subpectoral right anterolateral chest, measuring 6.3 x 12.2 x 13.6 cm, with additional soft tissue stranding/contusion within the partially visualized right chest wall. 2. Probable mild contusion within the subcutaneous fat of the mid anterior chest wall superficial to the sternum. No acute fracture within the thorax. 3. Patchy multifocal ground-glass opacities within the peripheral right upper lobe, most consistent with mild opacities within the left upper lobe and lingula, indeterminate, but could reflect atelectasis or infiltrates. 4. Additional patchy multifocal 5. Question focal angulation of the right scapular wing, not entirely certain, as there is motion artifact through this region. Correlation with physical exam recommended. Additionally, further assessment with plain film  radiography may be helpful for further evaluation. Electronically Signed   By: Rise Mu M.D.   On: 03/06/2018 06:44   Ct Head Wo Contrast  Result Date: 03/06/2018 CLINICAL DATA:  Head trauma, minor, GCS>=13, high clinical risk, initial exam. Patient called EMS because he was too weak to get out of his chair. EXAM: CT HEAD WITHOUT CONTRAST TECHNIQUE: Contiguous axial images were obtained from the base of the skull through the vertex without intravenous contrast. COMPARISON:  Head CT 02/21/2018 FINDINGS: Brain: Acute undulating right frontoparietal subdural hematoma with internal hypodensities possibly representing clot. Greatest dimension is 13 mm in the frontal region. Lesser degree of hemorrhage tracks into the right temporal lobe. There is no midline shift or mass effect. Generalized atrophy and chronic small vessel ischemia again seen. Vascular: Atherosclerosis of skullbase vasculature without hyperdense vessel or abnormal calcification. Skull: No fracture or focal lesion. Sinuses/Orbits: Paranasal sinuses are clear. Unchanged opacification of lower mastoid air cells bilaterally. No acute orbital abnormality. Other: None. IMPRESSION: 1. Acute moderate-sized right frontoparietal subdural hematoma measuring up to 13 mm in maximal dimension. No midline shift or mass effect. 2. Generalized atrophy and chronic small vessel ischemia. Critical Value/emergent results were called by telephone at the time of interpretation on 03/06/2018 at 6:17 am to Dr. Erma Heritage, who verbally acknowledged these results and will relay these findings to Dr. Manus Gunning. Electronically Signed   By: Narda Rutherford M.D.   On: 03/06/2018 06:18   Ct Chest Wo Contrast  Result Date: 03/06/2018 CLINICAL DATA:  Initial evaluation for acute blunt trauma. EXAM: CT CHEST, ABDOMEN AND PELVIS WITHOUT CONTRAST TECHNIQUE: Multidetector CT imaging of the chest, abdomen and pelvis was performed following the standard protocol without IV  contrast. COMPARISON:  Prior radiograph from earlier the same day. FINDINGS: CT CHEST FINDINGS Cardiovascular: Limited noncontrast evaluation of the aorta demonstrates no evidence for aneurysm or other obvious acute abnormality. Moderate atheromatous plaque within the aortic arch. Partially visualized great vessels normal in caliber without  obvious abnormality. Heart size normal. Prosthetic aortic valve noted. Prominent coronary artery calcifications within the LAD. No pericardial effusion. Main pulmonary artery of normal caliber. Mediastinum/Nodes: Thyroid within normal limits. No enlarged mediastinal, hilar, or axillary lymph nodes identified. No mediastinal hematoma. Esophagus within normal limits. Lungs/Pleura: Tracheobronchial tree intact and patent. Chronic elevation of the right hemidiaphragm. Scattered atelectatic changes seen dependently within the lung bases bilaterally, right greater than left. Patchy multifocal ground-glass opacities involving the peripheral subpleural aspect of the lateral right upper lobe likely reflect pulmonary contusion (series 5, image 38). Additional mild scattered patchy and ground-glass opacities within the peripheral left upper lobe (series 5, image 39), and lingula (series 5, image 84) could reflect atelectasis or possibly small infiltrates. No pneumothorax. No worrisome pulmonary nodule or mass. No pulmonary edema or pleural effusion. Musculoskeletal: Question irregularity with focal angulation at the right scapular wing (series 5, image 16), not entirely certain, as there is motion artifact through this region. No other acute osseous abnormality identified within the thorax. Remotely healed fractures of the right anterolateral eighth and ninth ribs noted. Chronic proximal left humeral fracture partially visualize, grossly similar. Soft tissue hematoma within the subpectoral right anterolateral chest measures 6.3 x 12.2 x 13.6 cm (series 3, image 22). Additional soft tissue  stranding/contusion present within the partially visualized right chest wall. Probable mild contusion within the subcutaneous fat of the mid anterior chest wall superficial to the sternum as well (series 3, image 26). CT ABDOMEN PELVIS FINDINGS Hepatobiliary: Liver demonstrates a normal unenhanced appearance. Gallbladder normal. No biliary dilatation. Pancreas: Pancreas within normal limits. Spleen: Spleen within normal limits. Adrenals/Urinary Tract: Adrenal glands are normal. Kidneys equal in size without nephrolithiasis or hydronephrosis. No appreciable hydroureter. Partially distended bladder within normal limits. Stomach/Bowel: Stomach within normal limits. Small 2.6 cm lipoma noted at the proximal duodenum. No evidence for bowel obstruction or acute bowel injury. No acute inflammatory changes seen about the bowels. Laxity of the right abdominal wall noted. Vascular/Lymphatic: Moderate aorto bi-iliac atherosclerotic disease. No aneurysm. No adenopathy. Reproductive: Prostate within normal limits. Other: Small bilateral fat containing inguinal hernias noted, right greater than left. No free air or fluid no mesenteric or retroperitoneal hematoma. Musculoskeletal: Visualized external soft tissues demonstrate no acute finding. No acute osseous abnormality. Prior interbody fusion at L5-S1. IMPRESSION: 1. Soft tissue hematoma within the subpectoral right anterolateral chest, measuring 6.3 x 12.2 x 13.6 cm, with additional soft tissue stranding/contusion within the partially visualized right chest wall. 2. Probable mild contusion within the subcutaneous fat of the mid anterior chest wall superficial to the sternum. No acute fracture within the thorax. 3. Patchy multifocal ground-glass opacities within the peripheral right upper lobe, most consistent with mild opacities within the left upper lobe and lingula, indeterminate, but could reflect atelectasis or infiltrates. 4. Additional patchy multifocal 5. Question focal  angulation of the right scapular wing, not entirely certain, as there is motion artifact through this region. Correlation with physical exam recommended. Additionally, further assessment with plain film radiography may be helpful for further evaluation. Electronically Signed   By: Rise Mu M.D.   On: 03/06/2018 06:44   Dg Chest Portable 1 View  Result Date: 03/06/2018 CLINICAL DATA:  Shortness of breath. EXAM: PORTABLE CHEST 1 VIEW COMPARISON:  Radiographs 02/22/2018 FINDINGS: Chronic elevation of right hemidiaphragm. Improved pulmonary edema from prior exam. Decreased blunting of right costophrenic angle likely improving pleural effusion. Unchanged heart size and mediastinal contours. No pneumothorax. Unchanged osseous structures. IMPRESSION: Improved pulmonary edema from prior exam.  Decreased right pleural effusion. Chronic elevation of right hemidiaphragm. Electronically Signed   By: Narda RutherfordMelanie  Sanford M.D.   On: 03/06/2018 05:29    ROS Blood pressure (!) 143/72, pulse 69, temperature 98.2 F (36.8 C), temperature source Oral, resp. rate (!) 22, SpO2 100 %. Physical Exam  Assessment/Plan: 72 year old Jorge [name] with small-to-moderate right frontal focal subdural hematoma. This is nonsurgical but would necessitate rapid reversal of his coagulation observation and repeat CT scan. Recommend ICU or stepdown admission reversal of his coagulopathy repeat CT scan 12 hours.  Collins Dimaria P 03/06/2018, 7:12 AM

## 2018-03-06 NOTE — ED Provider Notes (Signed)
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  Bladder scan 105 cc.  Vitals stable.  Afebrile.  EKG unchanged  Discharge summary reviewed.  Patient discharged November 23 after admission for septic shock with E. coli bacteremia and was to complete a 7-day course of Keflex.  Admission complicated by refractory shock necessitating ICU transfer, AKI, refusal of patient to go to nursing home.   Generalized weakness work-up pursued.  Patient with evidence of recent falls including large ecchymosis across chest.  He is anticoagulated.  Uncertain if he hit his head.  Labs show stable hemoglobin and chronic leukocytosis.  Creatinine is improving.  Chest x-ray appears improved.  CT head shows large subdural hematoma.  Patient is neurologically intact and awake.  INR is 5.  Plan emergent reversal with vitamin K and Kcentra.  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 Discussed with Dr. Wynetta Emery of neurosurgery who agrees with emergent reversal and medicine  admission.  CT chest/abdomen/pelvis with large hematoma and contusion without intrathoracic or intraabdominal injury.  UA concerning for infection again. Culture sent and rocephin started. Leukocytosis appears chronic. Gently hydration given.  Remains awake and alert with stable vitals. Admission dw Dr. Ophelia Charter who requests critical care consult. D/w Dr. Kearney Hard who will consult. Dr. Wynetta Emery seeing patient as well and states SDH will be nonoperative.   CRITICAL CARE Performed by: Glynn Octave Total critical care time: 60 minutes Critical care time was exclusive of separately billable procedures and treating other patients. Critical care was necessary to treat or prevent imminent or life-threatening deterioration. Critical care was time spent  personally by me on the following activities: development of treatment plan with patient and/or surrogate as well as nursing, discussions with consultants, evaluation of patient's response to treatment, examination of patient, obtaining history from patient or surrogate, ordering and performing treatments and interventions, ordering and review of laboratory studies, ordering and review of radiographic studies, pulse oximetry and re-evaluation of patient's condition.   Final Clinical Impressions(s) / ED Diagnoses   Final diagnoses:  Subdural hematoma (HCC)  Coagulopathy [facility])    ED Discharge Orders    None       Glynn Octave, MD 03/06/18 [phone]

## 2018-03-06 NOTE — ED Notes (Signed)
IV team at bedside attempting second IV and blood draw

## 2018-03-06 NOTE — ED Notes (Signed)
Critical care MD bedside at this time

## 2018-03-07 ENCOUNTER — Observation Stay (HOSPITAL_COMMUNITY): Payer: Medicare Other

## 2018-03-07 DIAGNOSIS — S065X9A Traumatic subdural hemorrhage with loss of consciousness of unspecified duration, initial encounter: Secondary | ICD-10-CM | POA: Diagnosis not present

## 2018-03-07 LAB — BASIC METABOLIC PANEL
Anion gap: 13 (ref 5–15)
BUN: 21 mg/dL (ref 8–23)
CO2: 21 mmol/L — ABNORMAL LOW (ref 22–32)
Calcium: 7.6 mg/dL — ABNORMAL LOW (ref 8.9–10.3)
Chloride: 103 mmol/L (ref 98–111)
Creatinine, Ser: 1.88 mg/dL — ABNORMAL HIGH (ref 0.61–1.24)
GFR calc Af Amer: 40 mL/min — ABNORMAL LOW (ref >60)
GFR, EST NON AFRICAN AMERICAN: 35 mL/min — AB (ref >60)
Glucose, Bld: 124 mg/dL — ABNORMAL HIGH (ref 70–99)
Potassium: 3.9 mmol/L (ref 3.5–5.1)
Sodium: 137 mmol/L (ref 135–145)

## 2018-03-07 LAB — CBC
HCT: 30 % — ABNORMAL LOW (ref 39.0–52.0)
Hemoglobin: 9.1 g/dL — ABNORMAL LOW (ref 13.0–17.0)
MCH: 29.5 pg (ref 26.0–34.0)
MCHC: 30.3 g/dL (ref 30.0–36.0)
MCV: 97.4 fL (ref 80.0–100.0)
Platelets: 372 10*3/uL (ref 150–400)
RBC: 3.08 MIL/uL — ABNORMAL LOW (ref 4.22–5.81)
RDW: 14.6 % (ref 11.5–15.5)
WBC: 15.9 10*3/uL — AB (ref 4.0–10.5)
nRBC: 0 % (ref 0.0–0.2)

## 2018-03-07 LAB — URINE CULTURE: Culture: NO GROWTH

## 2018-03-07 NOTE — Progress Notes (Addendum)
PROGRESS NOTE    Jorge [name]  ZOX:[id number] DOB: 11-Jan-1946 DOA: 03/06/2018 PCP: [name], PA-C  Brief Narrative: 72 year old male with multiple chronic medical problems, severe aortic stenosis, TAVR in 1/201 8, chronic A. fib, CVA, morbid obesity, chronic diastolic CHF, OSA OHS, recent hospitalization with sepsis acute kidney injury, bacteremia. -Patient has been declining over all, feels like his quality of life is terrible, is unable to care for himself   Assessment & Plan:     SDH (subdural hematoma) (HCC) -Likely following mechanical fall -Coumadin discontinued, given Kcentra and vitamin K yesterday -INR down to 1.1 -Appreciate neurosurgery input, repeat CT head stable with moderate sized subdural  Chest wall hematoma  History of severe aortic stenosis, TAVR, chronic atrial fibrillation  History of chronic diastolic CHF  Morbid obesity/OSA/OHS  Adult failure to thrive with multiple recent hospitalizations  History of CVA with mild residual right hemiplegia  Goals of care -Patient is alert awake oriented x4,  -He feels like he is chronically ill and declining over the last 6 to 9 months as a result of his multiple cardiac morbidities, namely severe aortic stenosis, chronic diastolic CHF, chronic A. fib, sleep apnea, history of stroke, frequent hospitalizations, 6 admissions in 6 months, ongoing debility and failure to thrive. -Patient wants to stop all medications -He understands that he is high risk of hemodynamic instability in the absence of his cardiac medications -He is DNR, he denies depression or suicidal intent or ideation -He feels that despite all the procedures and hospitalizations he has had in the last 6 months he is continued to decline -And is adamant about comfort focused care only, reports that he has expressed this wish to his family as well -We will discontinue telemetry -Comfort measures, appreciate palliative care input  DVT prophylaxis:  SCds Code Status: DNR Family Communication: No family at bedside Disposition Plan: To be determined  Consultants:   Neurosurgery   Procedures:   Antimicrobials:    Subjective: -In bed, uncomfortable -Reports that he is declining, feels that his time is near and wishes to stop all medications and not prolong his suffering -Denies depression, suicidal intent or ideation  Objective: Vitals:   03/07/18 0000 03/07/18 0300 03/07/18 0414 03/07/18 0700  BP: (!) 114/49  (!) 143/56 (!) 128/50  Pulse: 67 77  79  Resp: 19     Temp: 97.6 F (36.4 C) 97.9 F (36.6 C)  98.2 F (36.8 C)  TempSrc: Axillary Oral  Oral  SpO2: 99% 100%  98%    Intake/Output Summary (Last 24 hours) at 03/07/2018 1120 Last data filed at 03/07/2018 0400 Gross per 24 hour  Intake 1456.86 ml  Output 800 ml  Net 656.86 ml   There were no vitals filed for this visit.  Examination:  General exam: Obese elderly, chronically ill male laying in bed, AAOx4 Respiratory system: His breath sounds at both bases Right chest wall with hematoma Cardiovascular system: S1-S2/regular rate rhythm, systolic ejection murmur  gastrointestinal system: Abdomen is nondistended, soft and nontender.Normal bowel sounds heard. Central nervous system: Alert and oriented. No focal neurological deficits. Extremities: Ace edema Skin: No rashes, lesions or ulcers Psychiatry: Judgement and insight appear normal. Mood & affect appropriate.     Data Reviewed:   CBC: Recent Labs  Lab 03/06/18 0447 03/06/18 1513 03/07/18 0558  WBC 15.9* 18.6* 15.9*  NEUTROABS 13.2*  --   --   HGB 11.0* 9.5* 9.1*  HCT 34.8* 30.1* 30.0*  MCV 94.8 96.2 97.4  PLT 449*  351 372   Basic Metabolic Panel: Recent Labs  Lab 03/06/18 0447 03/07/18 0558  NA 135 137  K 4.3 3.9  CL 100 103  CO2 21* 21*  GLUCOSE 122* 124*  BUN 22 21  CREATININE 2.07* 1.88*  CALCIUM 8.4* 7.6*   GFR: CrCl cannot be calculated (Unknown ideal weight.). Liver  Function Tests: Recent Labs  Lab 03/06/18 0447  AST 19  ALT 19  ALKPHOS 92  BILITOT 0.9  PROT 7.4  ALBUMIN 2.9*   No results for input(s): LIPASE, AMYLASE in the last 168 hours. No results for input(s): AMMONIA in the last 168 hours. Coagulation Profile: Recent Labs  Lab 03/06/18 0447 03/06/18 0850 03/06/18 1543  INR 5.04* 1.11 1.14   Cardiac Enzymes: Recent Labs  Lab 03/06/18 0447  TROPONINI <0.03   BNP (last 3 results) No results for input(s): PROBNP in the last 8760 hours. HbA1C: No results for input(s): HGBA1C in the last 72 hours. CBG: No results for input(s): GLUCAP in the last 168 hours. Lipid Profile: No results for input(s): CHOL, HDL, LDLCALC, TRIG, CHOLHDL, LDLDIRECT in the last 72 hours. Thyroid Function Tests: No results for input(s): TSH, T4TOTAL, FREET4, T3FREE, THYROIDAB in the last 72 hours. Anemia Panel: No results for input(s): VITAMINB12, FOLATE, FERRITIN, TIBC, IRON, RETICCTPCT in the last 72 hours. Urine analysis:    Component Value Date/Time   COLORURINE STRAW (A) 03/06/2018 0636   APPEARANCEUR CLEAR 03/06/2018 0636   LABSPEC 1.006 03/06/2018 0636   PHURINE 6.0 03/06/2018 0636   GLUCOSEU NEGATIVE 03/06/2018 0636   HGBUR LARGE (A) 03/06/2018 0636   BILIRUBINUR NEGATIVE 03/06/2018 0636   KETONESUR 5 (A) 03/06/2018 0636   PROTEINUR NEGATIVE 03/06/2018 0636   NITRITE NEGATIVE 03/06/2018 0636   LEUKOCYTESUR SMALL (A) 03/06/2018 0636   Sepsis Labs: @LABRCNTIP (procalcitonin:4,lacticidven:4)  ) Recent Results (from the past 240 hour(s))  Blood culture (routine x 2)     Status: None (Preliminary result)   Collection Time: 03/06/18  4:30 AM  Result Value Ref Range Status   Specimen Description BLOOD RIGHT ARM  Final   Special Requests   Final    BOTTLES DRAWN AEROBIC ONLY Blood Culture results may not be optimal due to an excessive volume of blood received in culture bottles   Culture   Final    NO GROWTH 1 DAY Performed at [facility], [address] [place], [place] [address]    Report Status PENDING  Incomplete  Blood culture (routine x 2)     Status: None (Preliminary result)   Collection Time: 03/06/18  4:45 AM  Result Value Ref Range Status   Specimen Description BLOOD LEFT ARM  Final   Special Requests   Final    BOTTLES DRAWN AEROBIC AND ANAEROBIC Blood Culture results may not be optimal due to an excessive volume of blood received in culture bottles   Culture   Final    NO GROWTH 1 DAY Performed at [facility], [address], [place], [place] [address]    Report Status PENDING  Incomplete  Urine Culture     Status: None   Collection Time: 03/06/18  6:36 AM  Result Value Ref Range Status   Specimen Description URINE, RANDOM  Final   Special Requests NONE  Final   Culture   Final    NO GROWTH Performed at [facility], [address] [place], [place] [address]    Report Status 03/07/2018 FINAL  Final  MRSA  PCR Screening     Status: None   Collection Time: 03/06/18 10:16 AM  Result Value Ref Range Status   MRSA by PCR NEGATIVE NEGATIVE Final    Comment:        The GeneXpert MRSA Assay (FDA approved for NASAL specimens only), is one component of a comprehensive MRSA colonization surveillance program. It is not intended to diagnose MRSA infection nor to guide or monitor treatment for MRSA infections. Performed at [facility], [address] [place], [place] [id number]          Radiology Studies: Ct Abdomen Pelvis Wo Contrast  Result Date: 03/06/2018 CLINICAL DATA:  Initial evaluation for acute blunt trauma. EXAM: CT CHEST, ABDOMEN AND PELVIS WITHOUT CONTRAST TECHNIQUE: Multidetector CT imaging of the chest, abdomen and pelvis was performed following the standard protocol without IV contrast. COMPARISON:  Prior radiograph from earlier the same day. FINDINGS: CT CHEST FINDINGS Cardiovascular: Limited noncontrast evaluation of the aorta demonstrates no evidence for  aneurysm or other obvious acute abnormality. Moderate atheromatous plaque within the aortic arch. Partially visualized great vessels normal in caliber without obvious abnormality. Heart size normal. Prosthetic aortic valve noted. Prominent coronary artery calcifications within the LAD. No pericardial effusion. Main pulmonary artery of normal caliber. Mediastinum/Nodes: Thyroid within normal limits. No enlarged mediastinal, hilar, or axillary lymph nodes identified. No mediastinal hematoma. Esophagus within normal limits. Lungs/Pleura: Tracheobronchial tree intact and patent. Chronic elevation of the right hemidiaphragm. Scattered atelectatic changes seen dependently within the lung bases bilaterally, right greater than left. Patchy multifocal ground-glass opacities involving the peripheral subpleural aspect of the lateral right upper lobe likely reflect pulmonary contusion (series 5, image 38). Additional mild scattered patchy and ground-glass opacities within the peripheral left upper lobe (series 5, image 39), and lingula (series 5, image 84) could reflect atelectasis or possibly small infiltrates. No pneumothorax. No worrisome pulmonary nodule or mass. No pulmonary edema or pleural effusion. Musculoskeletal: Question irregularity with focal angulation at the right scapular wing (series 5, image 16), not entirely certain, as there is motion artifact through this region. No other acute osseous abnormality identified within the thorax. Remotely healed fractures of the right anterolateral eighth and ninth ribs noted. Chronic proximal left humeral fracture partially visualize, grossly similar. Soft tissue hematoma within the subpectoral right anterolateral chest measures 6.3 x 12.2 x 13.6 cm (series 3, image 22). Additional soft tissue stranding/contusion present within the partially visualized right chest wall. Probable mild contusion within the subcutaneous fat of the mid anterior chest wall superficial to the  sternum as well (series 3, image 26). CT ABDOMEN PELVIS FINDINGS Hepatobiliary: Liver demonstrates a normal unenhanced appearance. Gallbladder normal. No biliary dilatation. Pancreas: Pancreas within normal limits. Spleen: Spleen within normal limits. Adrenals/Urinary Tract: Adrenal glands are normal. Kidneys equal in size without nephrolithiasis or hydronephrosis. No appreciable hydroureter. Partially distended bladder within normal limits. Stomach/Bowel: Stomach within normal limits. Small 2.6 cm lipoma noted at the proximal duodenum. No evidence for bowel obstruction or acute bowel injury. No acute inflammatory changes seen about the bowels. Laxity of the right abdominal wall noted. Vascular/Lymphatic: Moderate aorto bi-iliac atherosclerotic disease. No aneurysm. No adenopathy. Reproductive: Prostate within normal limits. Other: Small bilateral fat containing inguinal hernias noted, right greater than left. No free air or fluid no mesenteric or retroperitoneal hematoma. Musculoskeletal: Visualized external soft tissues demonstrate no acute finding. No acute osseous abnormality. Prior interbody fusion at L5-S1. IMPRESSION: 1. Soft tissue hematoma within the subpectoral right anterolateral chest, measuring 6.3 x 12.2  x 13.6 cm, with additional soft tissue stranding/contusion within the partially visualized right chest wall. 2. Probable mild contusion within the subcutaneous fat of the mid anterior chest wall superficial to the sternum. No acute fracture within the thorax. 3. Patchy multifocal ground-glass opacities within the peripheral right upper lobe, most consistent with mild opacities within the left upper lobe and lingula, indeterminate, but could reflect atelectasis or infiltrates. 4. Additional patchy multifocal 5. Question focal angulation of the right scapular wing, not entirely certain, as there is motion artifact through this region. Correlation with physical exam recommended. Additionally, further  assessment with plain film radiography may be helpful for further evaluation. Electronically Signed   By: Rise Mu M.D.   On: 03/06/2018 06:44   Ct Head Wo Contrast  Result Date: 03/07/2018 CLINICAL DATA:  Follow-up subdural hemorrhage. EXAM: CT HEAD WITHOUT CONTRAST TECHNIQUE: Contiguous axial images were obtained from the base of the skull through the vertex without intravenous contrast. COMPARISON:  03/06/2018 FINDINGS: Brain: Acute right for frontoparietal convexity subdural hematoma has not significantly changed in size with maximal thickness of 12 mm. The hematoma remains predominantly hyperattenuating with scattered areas of low density internally. There is trace acute left parasagittal subdural hematoma at the vertex measuring 2 mm in thickness, better demonstrated on today's study though not substantially enlarged. There is no midline shift or other significant intracranial mass effect. No acute large territory infarct is evident. Patchy cerebral white matter hypodensities are unchanged and nonspecific but compatible with mild-to-moderate chronic small vessel ischemic disease. There is mild cerebral atrophy. Vascular: Calcified atherosclerosis at the skull base. No hyperdense vessel. Skull: No fracture or focal osseous lesion. Sinuses/Orbits: Minimal mucosal thickening in the maxillary sinuses. Trace mastoid effusions. Bilateral cataract extraction. Other: None. IMPRESSION: 1. Unchanged right frontoparietal subdural hematoma and trace left parasagittal subdural hematoma at the vertex. 2. No mass effect. 3. Mild-to-moderate chronic small vessel ischemic disease. Electronically Signed   By: Sebastian Ache M.D.   On: 03/07/2018 09:10   Ct Head Wo Contrast  Result Date: 03/06/2018 CLINICAL DATA:  Head trauma, minor, GCS>=13, high clinical risk, initial exam. Patient called EMS because he was too weak to get out of his chair. EXAM: CT HEAD WITHOUT CONTRAST TECHNIQUE: Contiguous axial images  were obtained from the base of the skull through the vertex without intravenous contrast. COMPARISON:  Head CT 02/21/2018 FINDINGS: Brain: Acute undulating right frontoparietal subdural hematoma with internal hypodensities possibly representing clot. Greatest dimension is 13 mm in the frontal region. Lesser degree of hemorrhage tracks into the right temporal lobe. There is no midline shift or mass effect. Generalized atrophy and chronic small vessel ischemia again seen. Vascular: Atherosclerosis of skullbase vasculature without hyperdense vessel or abnormal calcification. Skull: No fracture or focal lesion. Sinuses/Orbits: Paranasal sinuses are clear. Unchanged opacification of lower mastoid air cells bilaterally. No acute orbital abnormality. Other: None. IMPRESSION: 1. Acute moderate-sized right frontoparietal subdural hematoma measuring up to 13 mm in maximal dimension. No midline shift or mass effect. 2. Generalized atrophy and chronic small vessel ischemia. Critical Value/emergent results were called by telephone at the time of interpretation on 03/06/2018 at 6:17 am to Dr. Erma Heritage, who verbally acknowledged these results and will relay these findings to Dr. Manus Gunning. Electronically Signed   By: Narda Rutherford M.D.   On: 03/06/2018 06:18   Ct Chest Wo Contrast  Result Date: 03/06/2018 CLINICAL DATA:  Initial evaluation for acute blunt trauma. EXAM: CT CHEST, ABDOMEN AND PELVIS WITHOUT CONTRAST TECHNIQUE: Multidetector CT imaging  of the chest, abdomen and pelvis was performed following the standard protocol without IV contrast. COMPARISON:  Prior radiograph from earlier the same day. FINDINGS: CT CHEST FINDINGS Cardiovascular: Limited noncontrast evaluation of the aorta demonstrates no evidence for aneurysm or other obvious acute abnormality. Moderate atheromatous plaque within the aortic arch. Partially visualized great vessels normal in caliber without obvious abnormality. Heart size normal. Prosthetic  aortic valve noted. Prominent coronary artery calcifications within the LAD. No pericardial effusion. Main pulmonary artery of normal caliber. Mediastinum/Nodes: Thyroid within normal limits. No enlarged mediastinal, hilar, or axillary lymph nodes identified. No mediastinal hematoma. Esophagus within normal limits. Lungs/Pleura: Tracheobronchial tree intact and patent. Chronic elevation of the right hemidiaphragm. Scattered atelectatic changes seen dependently within the lung bases bilaterally, right greater than left. Patchy multifocal ground-glass opacities involving the peripheral subpleural aspect of the lateral right upper lobe likely reflect pulmonary contusion (series 5, image 38). Additional mild scattered patchy and ground-glass opacities within the peripheral left upper lobe (series 5, image 39), and lingula (series 5, image 84) could reflect atelectasis or possibly small infiltrates. No pneumothorax. No worrisome pulmonary nodule or mass. No pulmonary edema or pleural effusion. Musculoskeletal: Question irregularity with focal angulation at the right scapular wing (series 5, image 16), not entirely certain, as there is motion artifact through this region. No other acute osseous abnormality identified within the thorax. Remotely healed fractures of the right anterolateral eighth and ninth ribs noted. Chronic proximal left humeral fracture partially visualize, grossly similar. Soft tissue hematoma within the subpectoral right anterolateral chest measures 6.3 x 12.2 x 13.6 cm (series 3, image 22). Additional soft tissue stranding/contusion present within the partially visualized right chest wall. Probable mild contusion within the subcutaneous fat of the mid anterior chest wall superficial to the sternum as well (series 3, image 26). CT ABDOMEN PELVIS FINDINGS Hepatobiliary: Liver demonstrates a normal unenhanced appearance. Gallbladder normal. No biliary dilatation. Pancreas: Pancreas within normal limits.  Spleen: Spleen within normal limits. Adrenals/Urinary Tract: Adrenal glands are normal. Kidneys equal in size without nephrolithiasis or hydronephrosis. No appreciable hydroureter. Partially distended bladder within normal limits. Stomach/Bowel: Stomach within normal limits. Small 2.6 cm lipoma noted at the proximal duodenum. No evidence for bowel obstruction or acute bowel injury. No acute inflammatory changes seen about the bowels. Laxity of the right abdominal wall noted. Vascular/Lymphatic: Moderate aorto bi-iliac atherosclerotic disease. No aneurysm. No adenopathy. Reproductive: Prostate within normal limits. Other: Small bilateral fat containing inguinal hernias noted, right greater than left. No free air or fluid no mesenteric or retroperitoneal hematoma. Musculoskeletal: Visualized external soft tissues demonstrate no acute finding. No acute osseous abnormality. Prior interbody fusion at L5-S1. IMPRESSION: 1. Soft tissue hematoma within the subpectoral right anterolateral chest, measuring 6.3 x 12.2 x 13.6 cm, with additional soft tissue stranding/contusion within the partially visualized right chest wall. 2. Probable mild contusion within the subcutaneous fat of the mid anterior chest wall superficial to the sternum. No acute fracture within the thorax. 3. Patchy multifocal ground-glass opacities within the peripheral right upper lobe, most consistent with mild opacities within the left upper lobe and lingula, indeterminate, but could reflect atelectasis or infiltrates. 4. Additional patchy multifocal 5. Question focal angulation of the right scapular wing, not entirely certain, as there is motion artifact through this region. Correlation with physical exam recommended. Additionally, further assessment with plain film radiography may be helpful for further evaluation. Electronically Signed   By: Rise Mu M.D.   On: 03/06/2018 06:44   Dg Chest Portable 1  View  Result Date:  03/06/2018 CLINICAL DATA:  Shortness of breath. EXAM: PORTABLE CHEST 1 VIEW COMPARISON:  Radiographs 02/22/2018 FINDINGS: Chronic elevation of right hemidiaphragm. Improved pulmonary edema from prior exam. Decreased blunting of right costophrenic angle likely improving pleural effusion. Unchanged heart size and mediastinal contours. No pneumothorax. Unchanged osseous structures. IMPRESSION: Improved pulmonary edema from prior exam. Decreased right pleural effusion. Chronic elevation of right hemidiaphragm. Electronically Signed   By: Narda Rutherford M.D.   On: 03/06/2018 05:29        Scheduled Meds: . acetaminophen  650 mg Oral TID  . atorvastatin  40 mg Oral q1800  . ciprofloxacin  500 mg Oral BID  . DULoxetine  60 mg Oral q1800  . levothyroxine  100 mcg Oral Q0600  . Melatonin  4.5 mg Oral QHS  . metoprolol tartrate  25 mg Oral BID  . pantoprazole  40 mg Oral Daily  . polyethylene glycol powder  17 g Oral Daily  . sodium chloride flush  3 mL Intravenous Q12H  . tamsulosin  0.4 mg Oral QPC supper  . traZODone  50 mg Oral QHS   Continuous Infusions: . sodium chloride 500 mL (03/06/18 0733)     LOS: 0 days    Time spent:    Zannie Cove, MD [facility] Page via [email or web address], password [email or web address] After 7PM please contact night-coverage  03/07/2018, 11:20 AM

## 2018-03-07 NOTE — Progress Notes (Addendum)
3:01PM: CSW received call [facility]. [facility] stated they had availability but would need to speak with his son and would not be able to come up to meet with patient until Monday. Weekend CSW will hand off to weekday floor CSW for follow-up.   2:43PM: CSW received notification from on-call hospice liaison that there were no beds available but there was availability for home hospice or LTC hospice. CSW spoke with patient and informed him that there is no availability and discussed reaching out to [facility] [place] in [place], home hospice at [place], or staying [place] a [place] and receiving home hospice. CSW noted barriers to home hospice at patient's home due to no family or other people living with him. CSW encouraged patient to speak with family and see their availability to stay with him or for him to stay with them and receive home hospice care. CSW received verbal consent to see availability with [facility] although notes patient request to have time to think about it. CSW will follow up as updates are available.   12:05PM: CSW spoke with patient. CSW notes patient was requesting hospice facility in the [place] area. CSW called on-call hospice liaison who is currently checking on [facility] bed availability [facility]. CSW will continue to follow.  Tenna DelaineJoey Mariama Saintvil, LCSW, LCAS-A Clinical Social Worker II [phone]

## 2018-03-07 NOTE — Progress Notes (Signed)
[facility])  Received request from [facility], for patient interest in [facility]. Chart reviewed and spoke with patient to acknowledge.  Pt advises that he would like to discontinue all of his medications and pass peacefully.  Explained that [facility] is [facility] for the last two weeks of life.  Pt thought it was more of a long term care facility, as in months.  Unfortunately [facility] not able to offer a room today.    CSW aware.    [facility] liaison will follow up with CSW and should a room becomes available. Please do not hesitate to call with questions.    Thank you for this referral,  Wallis BambergJennifer Woody BSN, RN [facility] Hospital Liaison (listed in [place]) [phone]
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
## 2018-03-07 NOTE — Progress Notes (Addendum)
Patient is alert and oriented X4. He is aware of his medical needs. Patient is refusing to take his metoprolol. Patient's heart rate is 195. Patient states he does not want any of his heart medications. Patient said he is tired of taking medications and wants to focus on his comfort only. Spoke with provider, Dr. Jomarie LongsJoseph and she is aware of patient request.

## 2018-03-07 NOTE — Clinical Social Work Note (Signed)
Clinical Social Work Assessment  Patient Details  Name: Jorge [name] MRN: [id number] Date of Birth: 1946-02-24  Date of referral:  03/07/18               Reason for consult:  End of Life/Hospice                Permission sought to share information with:    Permission granted to share information::     Name::        Agency::     Relationship::     Contact Information:     Housing/Transportation Living arrangements for the past 2 months:  Single Family Home Source of Information:  Patient Patient Interpreter Needed:  None Criminal Activity/Legal Involvement Pertinent to Current Situation/Hospitalization:  No - Comment as needed Significant Relationships:  Adult Children, [place] Lives with:  Self Do you feel safe going back to the place where you live?  No Need for family participation in patient care:  Yes (Comment)  Care giving concerns:  CSW consulted for hospice placement.    Social Worker assessment / plan:  CSW met with pt at pt's beside. Pt. Alert and oriented x4. CSW notes pt requested hospice facility placement. CSW notes patient reports he wants to stay in the [place] area as his family is in the area and can visit him. CSW notes pt's report of talking with his family and his son (power of attorney) will be visiting this afternoon. CSW noted patient reported he has turned to his faith and wants to go home to the [place]. CSW will reach out to hospice liaison.  Employment status:  Retired Nurse, adult PT Recommendations:  Not assessed at this time Information / Referral to community resources:  Other (Comment Required)(Hospice)  Patient/Family's Response to care: Patient requesting withdrawal of life supporting medication and is seeking comfort care. CSW notes patient reports being aware of the results of ceasing his medications may have on him.  Patient/Family's Understanding of and Emotional Response to Diagnosis, Current Treatment, and  Prognosis:  CSW notes patient presented as accepting his condition and results of ceasing medications. CSW notes patient discussed his religious/spiritual beliefs and support he receives from them. CSW notes patient was open to hospice care and specifically had requested it as he wants to seek comfort care.  Emotional Assessment Appearance:  Appears stated age Attitude/Demeanor/Rapport:    Affect (typically observed):  Accepting, Calm, Pleasant Orientation:  Oriented to Self, Oriented to Situation, Oriented to Place, Oriented to  Time Alcohol / Substance use:    Psych involvement (Current and /or in the community):     Discharge Needs  Concerns to be addressed:  Discharge Planning Concerns Readmission within the last 30 days:  Yes Current discharge risk:  Terminally ill, Chronically ill, Lives alone Barriers to Discharge:  Other(Waiting for hospice consult)   Oretha Milch, LCSW 03/07/2018, 11:59 AM

## 2018-03-07 NOTE — Progress Notes (Signed)
Patient's son is visiting his father. He has requested to speak with provider. Text paged and called Dr. Jomarie LongsJoseph to forward patient's son's request.

## 2018-03-07 NOTE — Progress Notes (Signed)
   03/07/18 1100  Clinical Encounter Type  Visited With Patient  Visit Type Spiritual support  Referral From Nurse  Consult/Referral To Chaplain  Spiritual Encounters  Spiritual Needs Sacred text  Responded to page from nurse. Patient request a large print Bible. Provided a [employer] Large Print and patient greatly appreciated it. Per nurse the patient is a nice man. Patient was alert and sitting up in ed. Patient indicated that he request to be taken off his meds and he is ready to make peace with God. He states that his body can not endure much longer. Patient said he has told his family of his prognosis and his decision for his current medical care. He said the his family is very supportive. Provided spiritual and emotional support. Will follow-up as needed.

## 2018-03-07 NOTE — Progress Notes (Signed)
Patient ID: Jorge [name], male   DOB: 1945/11/16, 72 y.o.   MRN: [id number] Patient complains of headache however his level of consciousness is good motor strength appears symmetric without any evidence of a drift though he notes he has some chronic right upper extremity weakness from a previous stroke At this point I believe his subdural is stable we can observe him clinically.  No need to repeat a scan at this time.  Patient noted to me that he would like to come off of Coumadin but I noted that this is a discussion that he needs to have with his cardiologist and his primary care doctor

## 2018-03-08 ENCOUNTER — Encounter (HOSPITAL_COMMUNITY): Payer: Self-pay | Admitting: *Deleted

## 2018-03-08 ENCOUNTER — Other Ambulatory Visit: Payer: Self-pay

## 2018-03-08 DIAGNOSIS — S065X9A Traumatic subdural hemorrhage with loss of consciousness of unspecified duration, initial encounter: Secondary | ICD-10-CM | POA: Diagnosis not present

## 2018-03-08 MED ORDER — ALPRAZOLAM 0.5 MG PO TABS
1.0000 mg | ORAL_TABLET | Freq: Every evening | ORAL | Status: DC | PRN
  Filled 2018-03-08: qty 2

## 2018-03-08 MED ORDER — ALPRAZOLAM 0.5 MG PO TABS: 0.5000 mg | ORAL_TABLET | Freq: Every evening | ORAL | Status: DC | PRN

## 2018-03-08 MED ORDER — ALPRAZOLAM 0.5 MG PO TABS
1.0000 mg | ORAL_TABLET | ORAL | Status: DC | PRN
  Administered 2018-03-11 – 2018-03-12 (×2): 1 mg via ORAL
  Filled 2018-03-08 (×2): qty 2

## 2018-03-08 NOTE — Progress Notes (Signed)
1615 Bedside shift report. Pt opened eyes to RN verbal commands, but didn't speak, closed his eyes again to rest. RN attempted to assess pt, but patient stated, "can you please just leave me alone!". Fall precautions [facility], [facility].

## 2018-03-08 NOTE — Plan of Care (Signed)
Patient wishes to be a DNR, refusing medications, skin precautions, hospice consulted per patient request

## 2018-03-08 NOTE — Progress Notes (Signed)
Palliative Care Progress Note  Continuing to monitor Jorge [name]'s progression off his "critical medications". He is remarkably alert oriented and clearly stating his wishes to be allowed to have a natural death when that time occurs and to be cared for with comfort and dignity as his goal.He has received all available PO doses of PRN hydrocodone, minimal IV morphine has been administered. He very anxious at baseline -will increase his alprazolam to q4prn.   Difficult to place in his current condition-but I expect this to change over the next few days and that he will progress to rapid Afib, CHF decompensation and off anticoagulation is at risk for a thromboembolic event.  He should meet hospice criteria in general and could be best served in a hospice facility at the point at which he begins to decline or needs increasing symptom management intervention for respiratory and cardiovascular failure.  Will continue to follow.  Anderson MaltaElizabeth Rayme Bui, DO Palliative Medicine [phone]  Time: 35 min Greater than 50%  of this time was spent counseling and coordinating care related to the above assessment and plan.

## 2018-03-08 NOTE — Progress Notes (Signed)
NEUROSURGERY PROGRESS NOTE  Doing well. Denies any HA N/V. He did express that he "is ready to be with the [place]" and wants to go to hospice. He does not feel like his body does well being on all of his medication.   Temp:  [98 F (36.7 C)-98.3 F (36.8 C)] 98.3 F (36.8 C) (12/02 0344) Pulse Rate:  [72-75] 72 (12/02 0344) Resp:  [16-19] 16 (12/02 0344) BP: (131-139)/(55-63) 139/63 (12/02 0344) SpO2:  [97 %-98 %] 98 % (12/02 0344)  Plan: CT head stable. Would recommend staying off coumadin for atleast 72 hours at the minimum but would prefer 2 weeks. No new recom.   Sherryl MangesKimberly Hannah Bentlie Catanzaro, NP 03/08/2018 8:13 AM

## 2018-03-08 NOTE — Social Work (Signed)
CSW sent referral to [facility]. Liaison [place] aware.  Octavio GravesIsabel Oluwatimileyin Vivier, MSW, [facility] [place] Clinical Social Work [phone]

## 2018-03-08 NOTE — Progress Notes (Signed)
PROGRESS NOTE    Jorge [name]  UEA:[id number] DOB: [birth year] DOA: 03/06/2018 PCP: Lonie Peakonroy, Nathan, PA-C  Brief Narrative: 72 year old male with multiple chronic medical problems, severe aortic stenosis, TAVR in 1/201 8, chronic A. fib, CVA, morbid obesity, chronic diastolic CHF, OSA OHS, recent hospitalization with sepsis acute kidney injury, bacteremia. -Patient has been declining overall, feels like his quality of life is terrible, is unable to care for himself -Wants to focus on comfort care only and is hoping for hospice type facility  Assessment & Plan:     SDH (subdural hematoma) (HCC) -Likely following mechanical fall -Coumadin discontinued, given Kcentra and vitamin K on admission, INR down to 1.1 -Appreciate neurosurgery input, repeat CT head stable with moderate sized SDH  Chest wall hematoma  History of severe aortic stenosis, TAVR, chronic atrial fibrillation  History of chronic diastolic CHF  Morbid obesity/OSA/OHS  Adult failure to thrive with multiple recent hospitalizations  History of CVA with mild residual right hemiplegia  Goals of care -Long discussion with patient at bedside, palliative MD, staff and later with son -Patient is alert awake oriented x4 denies depression, any suicidal intent or ideation -He is chronically ill, with declining health, multiple frequent hospitalizations secondary to severe aortic stenosis, chronic diastolic CHF, chronic atrial fibrillation, sleep apnea, history of stroke, ongoing debility and failure to thrive -He has discontinued all his medications and wants to focus on comfort focused care only understanding risk of hemodynamic instability in the absence of cardiac medications -His vital signs are surprisingly stable today -He is DNR, hoping for hospice facility, unless his vital signs significantly deteriorate in the next 24-48 hrs he would not be a candidate for residential hospice -Called and discussed with son [name] who  acknowledges patient's wishes -Social work, palliative following -Continue comfort focused care  DVT prophylaxis: SCds Code Status: DNR Family Communication: No family at bedside, called and discussed with son [name] Disposition Plan: To be determined  Consultants:   Neurosurgery   Procedures:   Antimicrobials:    Subjective: -Relatively comfortable, -Mild cough and congestion  Objective: Vitals:   03/07/18 1955 03/07/18 2327 03/08/18 0344 03/08/18 0800  BP: (!) 131/55  139/63 127/88  Pulse: 75  72 (!) 117  Resp: 19  16 15   Temp: 98.2 F (36.8 C) 98 F (36.7 C) 98.3 F (36.8 C) 98.2 F (36.8 C)  TempSrc: Oral Oral Oral   SpO2: 97%  98% (!) 88%    Intake/Output Summary (Last 24 hours) at 03/08/2018 1243 Last data filed at 03/08/2018 1100 Gross per 24 hour  Intake 240 ml  Output 3600 ml  Net -3360 ml   There were no vitals filed for this visit.  Examination:  Gen: Awake, Alert, Oriented X 4, is chronically ill male HEENT: PERRLA, Neck supple, no JVD Lungs: Decreased breath sounds at both bases CVS: S1-S2/regular rate rhythm, systolic ejection murmur Abd: soft, Non tender, non distended, BS present Extremities: No edema Skin: no new rashes Psychiatry: Judgement and insight appear normal. Mood & affect appropriate.     Data Reviewed:   CBC: Recent Labs  Lab 03/06/18 0447 03/06/18 1513 03/07/18 0558  WBC 15.9* 18.6* 15.9*  NEUTROABS 13.2*  --   --   HGB 11.0* 9.5* 9.1*  HCT 34.8* 30.1* 30.0*  MCV 94.8 96.2 97.4  PLT 449* 351 372   Basic Metabolic Panel: Recent Labs  Lab 03/06/18 0447 03/07/18 0558  NA 135 137  K 4.3 3.9  CL 100 103  CO2 21*  21*  GLUCOSE 122* 124*  BUN 22 21  CREATININE 2.07* 1.88*  CALCIUM 8.4* 7.6*   GFR: CrCl cannot be calculated (Unknown ideal weight.). Liver Function Tests: Recent Labs  Lab 03/06/18 0447  AST 19  ALT 19  ALKPHOS 92  BILITOT 0.9  PROT 7.4  ALBUMIN 2.9*   No results for input(s): LIPASE,  AMYLASE in the last 168 hours. No results for input(s): AMMONIA in the last 168 hours. Coagulation Profile: Recent Labs  Lab 03/06/18 0447 03/06/18 0850 03/06/18 1543  INR 5.04* 1.11 1.14   Cardiac Enzymes: Recent Labs  Lab 03/06/18 0447  TROPONINI <0.03   BNP (last 3 results) No results for input(s): PROBNP in the last 8760 hours. HbA1C: No results for input(s): HGBA1C in the last 72 hours. CBG: No results for input(s): GLUCAP in the last 168 hours. Lipid Profile: No results for input(s): CHOL, HDL, LDLCALC, TRIG, CHOLHDL, LDLDIRECT in the last 72 hours. Thyroid Function Tests: No results for input(s): TSH, T4TOTAL, FREET4, T3FREE, THYROIDAB in the last 72 hours. Anemia Panel: No results for input(s): VITAMINB12, FOLATE, FERRITIN, TIBC, IRON, RETICCTPCT in the last 72 hours. Urine analysis:    Component Value Date/Time   COLORURINE STRAW (A) 03/06/2018 0636   APPEARANCEUR CLEAR 03/06/2018 0636   LABSPEC 1.006 03/06/2018 0636   PHURINE 6.0 03/06/2018 0636   GLUCOSEU NEGATIVE 03/06/2018 0636   HGBUR LARGE (A) 03/06/2018 0636   BILIRUBINUR NEGATIVE 03/06/2018 0636   KETONESUR 5 (A) 03/06/2018 0636   PROTEINUR NEGATIVE 03/06/2018 0636   NITRITE NEGATIVE 03/06/2018 0636   LEUKOCYTESUR SMALL (A) 03/06/2018 0636   Sepsis Labs: @LABRCNTIP (procalcitonin:4,lacticidven:4)  ) Recent Results (from the past 240 hour(s))  Blood culture (routine x 2)     Status: None (Preliminary result)   Collection Time: 03/06/18  4:30 AM  Result Value Ref Range Status   Specimen Description BLOOD RIGHT ARM  Final   Special Requests   Final    BOTTLES DRAWN AEROBIC ONLY Blood Culture results may not be optimal due to an excessive volume of blood received in culture bottles   Culture   Final    NO GROWTH 2 DAYS Performed at [facility], [address] [place], [place] [address]    Report Status PENDING  Incomplete  Blood culture (routine x 2)     Status: None (Preliminary result)     Collection Time: 03/06/18  4:45 AM  Result Value Ref Range Status   Specimen Description BLOOD LEFT ARM  Final   Special Requests   Final    BOTTLES DRAWN AEROBIC AND ANAEROBIC Blood Culture results may not be optimal due to an excessive volume of blood received in culture bottles   Culture   Final    NO GROWTH 2 DAYS Performed at [facility], [address] [place], [place] [address]    Report Status PENDING  Incomplete  Urine Culture     Status: None   Collection Time: 03/06/18  6:36 AM  Result Value Ref Range Status   Specimen Description URINE, RANDOM  Final   Special Requests NONE  Final   Culture   Final    NO GROWTH Performed at [facility], [address], [place], [place] [address]    Report Status 03/07/2018 FINAL  Final  MRSA PCR Screening     Status: None   Collection Time: 03/06/18 10:16 AM  Result Value Ref Range Status   MRSA by PCR NEGATIVE NEGATIVE Final  Comment:        The GeneXpert MRSA Assay (FDA approved for NASAL specimens only), is one component of a comprehensive MRSA colonization surveillance program. It is not intended to diagnose MRSA infection nor to guide or monitor treatment for MRSA infections. Performed at [facility], [address] [place], [place] [address]          Radiology Studies: Ct Head Wo Contrast  Result Date: 03/07/2018 CLINICAL DATA:  Follow-up subdural hemorrhage. EXAM: CT HEAD WITHOUT CONTRAST TECHNIQUE: Contiguous axial images were obtained from the base of the skull through the vertex without intravenous contrast. COMPARISON:  03/06/2018 FINDINGS: Brain: Acute right for frontoparietal convexity subdural hematoma has not significantly changed in size with maximal thickness of 12 mm. The hematoma remains predominantly hyperattenuating with scattered areas of low density internally. There is trace acute left parasagittal subdural hematoma at the vertex measuring 2 mm in thickness, better  demonstrated on today's study though not substantially enlarged. There is no midline shift or other significant intracranial mass effect. No acute large territory infarct is evident. Patchy cerebral white matter hypodensities are unchanged and nonspecific but compatible with mild-to-moderate chronic small vessel ischemic disease. There is mild cerebral atrophy. Vascular: Calcified atherosclerosis at the skull base. No hyperdense vessel. Skull: No fracture or focal osseous lesion. Sinuses/Orbits: Minimal mucosal thickening in the maxillary sinuses. Trace mastoid effusions. Bilateral cataract extraction. Other: None. IMPRESSION: 1. Unchanged right frontoparietal subdural hematoma and trace left parasagittal subdural hematoma at the vertex. 2. No mass effect. 3. Mild-to-moderate chronic small vessel ischemic disease. Electronically Signed   By: Sebastian Ache M.D.   On: 03/07/2018 09:10        Scheduled Meds: . acetaminophen  650 mg Oral TID  . Melatonin  4.5 mg Oral QHS  . polyethylene glycol powder  17 g Oral Daily  . sodium chloride flush  3 mL Intravenous Q12H  . traZODone  50 mg Oral QHS   Continuous Infusions: . sodium chloride 500 mL (03/06/18 0733)     LOS: 0 days    Time spent:    Zannie Cove, MD [facility] Page via [email or web address], password [email or web address] After 7PM please contact night-coverage  03/08/2018, 12:43 PM

## 2018-03-08 NOTE — Progress Notes (Signed)
[facility])  Visited with pt, son and granddaughter at bedside to explain hospice services, and [facility] criteria.  Explained that he does not currently meet criteria for [facility] residential hospice.  Explained that the SW will provide referrals to other residential hospices to see if he would be eligible for placement there.  Pt is adamant that he is ready to "be with the Lord".  He states he has stopped all of his "heart medications", and feels like he is not as hungry or thirsty.  He indicates poor quality of life prior to this hospitalization.  Educated him on home hospice services and what that could help him with at home.  Offered support and listened.   [facility] is available for continued discussion if needed.  Thank you, Wallis BambergJennifer Woody BSN, RN [facility] Hospital Liaison (listed in [place]) [phone]

## 2018-03-08 NOTE — Social Work (Addendum)
CSW f/u with [name].   [facility] is unable to offer placement, they state that they may be able to evaluate hospice at home services due to prognosis. Jorge [name] will come by and discuss this with pt and then also discuss with pt son, Jorge Postlex.   Received a call from Jorge [name] with [facility] She states that after talking with pt and pt son that if [facility] not able to offer then they would like pt referred to [facility] [place].   Octavio GravesIsabel Shawnetta [name], MSW, [facility] [place] Clinical Social Work [phone]

## 2018-03-09 DIAGNOSIS — S20211A Contusion of right front wall of thorax, initial encounter: Secondary | ICD-10-CM | POA: Diagnosis not present

## 2018-03-09 DIAGNOSIS — I10 Essential (primary) hypertension: Secondary | ICD-10-CM | POA: Diagnosis not present

## 2018-03-09 DIAGNOSIS — W1830XA Fall on same level, unspecified, initial encounter: Secondary | ICD-10-CM | POA: Diagnosis present

## 2018-03-09 DIAGNOSIS — Z953 Presence of xenogenic heart valve: Secondary | ICD-10-CM | POA: Diagnosis not present

## 2018-03-09 DIAGNOSIS — K219 Gastro-esophageal reflux disease without esophagitis: Secondary | ICD-10-CM | POA: Diagnosis present

## 2018-03-09 DIAGNOSIS — I130 Hypertensive heart and chronic kidney disease with heart failure and stage 1 through stage 4 chronic kidney disease, or unspecified chronic kidney disease: Secondary | ICD-10-CM | POA: Diagnosis present

## 2018-03-09 DIAGNOSIS — I252 Old myocardial infarction: Secondary | ICD-10-CM | POA: Diagnosis not present

## 2018-03-09 DIAGNOSIS — Z7901 Long term (current) use of anticoagulants: Secondary | ICD-10-CM | POA: Diagnosis not present

## 2018-03-09 DIAGNOSIS — G4733 Obstructive sleep apnea (adult) (pediatric): Secondary | ICD-10-CM | POA: Diagnosis present

## 2018-03-09 DIAGNOSIS — N179 Acute kidney failure, unspecified: Secondary | ICD-10-CM | POA: Diagnosis not present

## 2018-03-09 DIAGNOSIS — Z66 Do not resuscitate: Secondary | ICD-10-CM | POA: Diagnosis present

## 2018-03-09 DIAGNOSIS — R011 Cardiac murmur, unspecified: Secondary | ICD-10-CM | POA: Diagnosis present

## 2018-03-09 DIAGNOSIS — I5032 Chronic diastolic (congestive) heart failure: Secondary | ICD-10-CM | POA: Diagnosis present

## 2018-03-09 DIAGNOSIS — Z515 Encounter for palliative care: Secondary | ICD-10-CM | POA: Diagnosis not present

## 2018-03-09 DIAGNOSIS — Z981 Arthrodesis status: Secondary | ICD-10-CM | POA: Diagnosis not present

## 2018-03-09 DIAGNOSIS — G629 Polyneuropathy, unspecified: Secondary | ICD-10-CM | POA: Diagnosis present

## 2018-03-09 DIAGNOSIS — I2510 Atherosclerotic heart disease of native coronary artery without angina pectoris: Secondary | ICD-10-CM | POA: Diagnosis present

## 2018-03-09 DIAGNOSIS — Z7982 Long term (current) use of aspirin: Secondary | ICD-10-CM | POA: Diagnosis not present

## 2018-03-09 DIAGNOSIS — E039 Hypothyroidism, unspecified: Secondary | ICD-10-CM | POA: Diagnosis present

## 2018-03-09 DIAGNOSIS — N400 Enlarged prostate without lower urinary tract symptoms: Secondary | ICD-10-CM | POA: Diagnosis present

## 2018-03-09 DIAGNOSIS — D6832 Hemorrhagic disorder due to extrinsic circulating anticoagulants: Secondary | ICD-10-CM | POA: Diagnosis present

## 2018-03-09 DIAGNOSIS — I69351 Hemiplegia and hemiparesis following cerebral infarction affecting right dominant side: Secondary | ICD-10-CM | POA: Diagnosis not present

## 2018-03-09 DIAGNOSIS — D689 Coagulation defect, unspecified: Secondary | ICD-10-CM | POA: Diagnosis present

## 2018-03-09 DIAGNOSIS — E782 Mixed hyperlipidemia: Secondary | ICD-10-CM | POA: Diagnosis present

## 2018-03-09 DIAGNOSIS — Z6841 Body Mass Index (BMI) 40.0 and over, adult: Secondary | ICD-10-CM | POA: Diagnosis not present

## 2018-03-09 DIAGNOSIS — I482 Chronic atrial fibrillation, unspecified: Secondary | ICD-10-CM | POA: Diagnosis present

## 2018-03-09 DIAGNOSIS — I480 Paroxysmal atrial fibrillation: Secondary | ICD-10-CM | POA: Diagnosis present

## 2018-03-09 DIAGNOSIS — G4734 Idiopathic sleep related nonobstructive alveolar hypoventilation: Secondary | ICD-10-CM | POA: Diagnosis not present

## 2018-03-09 DIAGNOSIS — S065X9A Traumatic subdural hemorrhage with loss of consciousness of unspecified duration, initial encounter: Secondary | ICD-10-CM | POA: Diagnosis present

## 2018-03-09 DIAGNOSIS — N183 Chronic kidney disease, stage 3 (moderate): Secondary | ICD-10-CM | POA: Diagnosis present

## 2018-03-09 NOTE — Progress Notes (Signed)
[facility] [employer] Met with pt at hospital spoke to pt's son over phone. He is a Administrator and is driving back in from [place] today. He plans to be at hospital tomorrow to help with a discharge plan. I explained to him that he did not meet the criteria for Hospice House in [facility] He verbalized understanding. We would be able to serve him in a different setting for hospice care at home, SNF or ALF.  Webb Silversmith RN

## 2018-03-09 NOTE — Progress Notes (Addendum)
PROGRESS NOTE    Jorge [name]  ZOX:[id number] DOB: 04-Jun-1945 DOA: 03/06/2018 PCP: Lonie Peakonroy, Nathan, PA-C  Brief Narrative: 72 year old male with multiple chronic medical problems, severe aortic stenosis, TAVR in 1/201 8, chronic A. fib, CVA, morbid obesity, chronic diastolic CHF, OSA OHS, recent hospitalization with sepsis acute kidney injury, bacteremia. -Patient has been declining overall, feels like his quality of life is terrible, is unable to care for himself -Wants to focus on comfort care only and is hoping for hospice type facility  Assessment & Plan:   Moderate SDH (subdural hematoma) (HCC) -Likely following mechanical fall -Coumadin discontinued, given Kcentra and vitamin K on admission, INR down to 1.1 -Appreciate neurosurgery input, repeat CT head stable with moderate sized SDH -Stable  Chest wall hematoma   History of severe aortic stenosis, TAVR, chronic atrial fibrillation -All Meds discontinued  History of chronic diastolic CHF -Comfort care, meds discontinued  Morbid obesity/OSA/OHS  Adult failure to thrive with multiple recent hospitalizations  History of CVA with mild residual right hemiplegia -Comfort care as noted above  Goals of care -Status post multiple extensive discussions with patient, palliative MD, and discussed with son [name] as well  -patient is chronically ill with declining health,  multiple frequent hospitalizations, severe aortic stenosis, chronic diastolic CHF, chronic atrial fibrillation, stroke, ongoing debility failure to thrive, morbid obesity  -DNR, he wishes for comfort focused care only,  -discontinued all other medications per patient's wishes -Continue IV morphine as needed for comfort, has worsening of his chronic pain in the setting of hypoperfusion -Disposition yet to be determined not a candidate for residential hospice unless his vital signs deteriorate in the absence of cardiac meds -Social work, palliative  following -Continue comfort focused care  DVT prophylaxis: SCds Code Status: DNR Family Communication: No family at bedside, called and discussed with son [name] Disposition Plan: To be determined  Consultants:   Neurosurgery   Procedures:   Antimicrobials:    Subjective: -Complains of mild dyspnea  Objective: Vitals:   03/08/18 0344 03/08/18 0800 03/08/18 1930 03/08/18 2320  BP: 139/63 127/88 129/62 129/62  Pulse: 72 (!) 117 81 81  Resp: 16 15 14 14   Temp: 98.3 F (36.8 C) 98.2 F (36.8 C) 98.4 F (36.9 C) 98.4 F (36.9 C)  TempSrc: Oral  Oral Oral  SpO2: 98% (!) 88% 96%   Weight:    (!) 136.8 kg  Height:    6' (1.829 m)    Intake/Output Summary (Last 24 hours) at 03/09/2018 1346 Last data filed at 03/09/2018 0900 Gross per 24 hour  Intake 342 ml  Output 1750 ml  Net -1408 ml   Filed Weights   03/08/18 2320  Weight: (!) 136.8 kg    Examination:  Gen: Awake, Alert, Oriented X 3, chronically ill male, uncomfortable appearing HEENT: PERRLA, Neck supple, no JVD Lungs: Good air movement bilaterally, CTAB CVS: S2/regular rate rhythm, systolic ejection murmur Abd: soft, Non tender, non distended, BS present Extremities: No Cyanosis, Clubbing or edema Skin: no new rashes Psychiatry: Judgement and insight appear normal. Mood & affect appropriate.     Data Reviewed:   CBC: Recent Labs  Lab 03/06/18 0447 03/06/18 1513 03/07/18 0558  WBC 15.9* 18.6* 15.9*  NEUTROABS 13.2*  --   --   HGB 11.0* 9.5* 9.1*  HCT 34.8* 30.1* 30.0*  MCV 94.8 96.2 97.4  PLT 449* 351 372   Basic Metabolic Panel: Recent Labs  Lab 03/06/18 0447 03/07/18 0558  NA 135 137  K 4.3  3.9  CL 100 103  CO2 21* 21*  GLUCOSE 122* 124*  BUN 22 21  CREATININE 2.07* 1.88*  CALCIUM 8.4* 7.6*   GFR: Estimated Creatinine Clearance: 50.9 mL/min (A) (by C-G formula based on SCr of 1.88 mg/dL (H)). Liver Function Tests: Recent Labs  Lab 03/06/18 0447  AST 19  ALT 19  ALKPHOS 92   BILITOT 0.9  PROT 7.4  ALBUMIN 2.9*   No results for input(s): LIPASE, AMYLASE in the last 168 hours. No results for input(s): AMMONIA in the last 168 hours. Coagulation Profile: Recent Labs  Lab 03/06/18 0447 03/06/18 0850 03/06/18 1543  INR 5.04* 1.11 1.14   Cardiac Enzymes: Recent Labs  Lab 03/06/18 0447  TROPONINI <0.03   BNP (last 3 results) No results for input(s): PROBNP in the last 8760 hours. HbA1C: No results for input(s): HGBA1C in the last 72 hours. CBG: No results for input(s): GLUCAP in the last 168 hours. Lipid Profile: No results for input(s): CHOL, HDL, LDLCALC, TRIG, CHOLHDL, LDLDIRECT in the last 72 hours. Thyroid Function Tests: No results for input(s): TSH, T4TOTAL, FREET4, T3FREE, THYROIDAB in the last 72 hours. Anemia Panel: No results for input(s): VITAMINB12, FOLATE, FERRITIN, TIBC, IRON, RETICCTPCT in the last 72 hours. Urine analysis:    Component Value Date/Time   COLORURINE STRAW (A) 03/06/2018 0636   APPEARANCEUR CLEAR 03/06/2018 0636   LABSPEC 1.006 03/06/2018 0636   PHURINE 6.0 03/06/2018 0636   GLUCOSEU NEGATIVE 03/06/2018 0636   HGBUR LARGE (A) 03/06/2018 0636   BILIRUBINUR NEGATIVE 03/06/2018 0636   KETONESUR 5 (A) 03/06/2018 0636   PROTEINUR NEGATIVE 03/06/2018 0636   NITRITE NEGATIVE 03/06/2018 0636   LEUKOCYTESUR SMALL (A) 03/06/2018 0636   Sepsis Labs: @LABRCNTIP (procalcitonin:4,lacticidven:4)  ) Recent Results (from the past 240 hour(s))  Blood culture (routine x 2)     Status: None (Preliminary result)   Collection Time: 03/06/18  4:30 AM  Result Value Ref Range Status   Specimen Description BLOOD RIGHT ARM  Final   Special Requests   Final    BOTTLES DRAWN AEROBIC ONLY Blood Culture results may not be optimal due to an excessive volume of blood received in culture bottles   Culture   Final    NO GROWTH 2 DAYS Performed at [facility], [address], [place], [place] [address]    Report Status PENDING   Incomplete  Blood culture (routine x 2)     Status: None (Preliminary result)   Collection Time: 03/06/18  4:45 AM  Result Value Ref Range Status   Specimen Description BLOOD LEFT ARM  Final   Special Requests   Final    BOTTLES DRAWN AEROBIC AND ANAEROBIC Blood Culture results may not be optimal due to an excessive volume of blood received in culture bottles   Culture   Final    NO GROWTH 2 DAYS Performed at [facility], [address] [place], [place] [address]    Report Status PENDING  Incomplete  Urine Culture     Status: None   Collection Time: 03/06/18  6:36 AM  Result Value Ref Range Status   Specimen Description URINE, RANDOM  Final   Special Requests NONE  Final   Culture   Final    NO GROWTH Performed at [facility], [address], [place], [place] [address]    Report Status 03/07/2018 FINAL  Final  MRSA PCR Screening     Status: None   Collection Time: 03/06/18  10:16 AM  Result Value Ref Range Status   MRSA by PCR NEGATIVE NEGATIVE Final    Comment:        The GeneXpert MRSA Assay (FDA approved for NASAL specimens only), is one component of a comprehensive MRSA colonization surveillance program. It is not intended to diagnose MRSA infection nor to guide or monitor treatment for MRSA infections. Performed at [facility], [address], [place], [place] [address]          Radiology Studies: No results found.      Scheduled Meds: . acetaminophen  650 mg Oral TID  . Melatonin  4.5 mg Oral QHS  . polyethylene glycol powder  17 g Oral Daily  . sodium chloride flush  3 mL Intravenous Q12H  . traZODone  50 mg Oral QHS   Continuous Infusions: . sodium chloride 500 mL (03/06/18 0733)     LOS: 0 days    Time spent:    Zannie Cove, MD [facility] Page via [email or web address], password [email or web address] After 7PM please contact night-coverage  03/09/2018, 1:46 PM

## 2018-03-09 NOTE — Social Work (Addendum)
3:45pm- Spoke with Dennard NipCheri, liaison for [facility]. Pt screened as not appropriate for inpatient hospice home by [facility] MD. He may qualify for in home services or private pay SNF with hospice should he not wish to pursue therapies and active treatment. Cheri will reach out to pt's son [name].  1:19pm- No beds available at [facility]. Referral given to pt family other choice, [facility] to see if there is any [facility] [address] [facility] [address] [facility]. If pt not able to go to inpatient hospice home will need to consider going home with hospice services or private pay for SNF with hospice.   Will continue to follow.  Octavio GravesIsabel Alyscia Carmon, MSW, [facility] [place] Clinical Social Work [phone]

## 2018-03-09 NOTE — Care Management Note (Addendum)
Case Management Note  Patient Details  Name: Marygrace DroughtGeorge O Base MRN: [id number] Date of Birth: 1945/12/06  Subjective/Objective:    Pt admitted on 03/06/18 with SDH following fall; PMH of multiple chronic medical problems.  Pt feels he has been declining overall and that his quality of life is poor.  He has stopped all of his heart medications and desires to go to residential Hospice facility for placement, if possible.  PTA, pt lives at home alone and [facility], PT, HH aide and CSW.                 Action/Plan: CSW following for possible Hospice Home placement, but so far pt has not been accepted at any of the area facilities, as he does not meet admitting criteria.  Per notes, pt's son should arrive in AM to assist with disposition.  May need SNF with Hospice f/u if unable to go home with Hospice care.   Expected Discharge Date:                  Expected Discharge Plan:  Hospice Medical Facility  In-House Referral:  Clinical Social Work  Discharge planning Services  CM Consult  Post Acute Care Choice:    Choice offered to:     DME Arranged:    DME Agency:     HH Arranged:    HH Agency:     Status of Service:  In process, will continue to follow  If discussed at Long Length of Stay Meetings, dates discussed:    Additional Comments:  Quintella BatonJulie W. Kit Brubacher, RN, BSN  Trauma/Neuro ICU Case Manager [phone]

## 2018-03-09 NOTE — Progress Notes (Signed)
NEUROSURGERY PROGRESS NOTE  Patient frustrated and "just wants to go home to be with the [place]." Palliative care met with him yesterday and recommends hospice once he begins to decline when off his medications.   Temp:  [98.4 F (36.9 C)] 98.4 F (36.9 C) (12/02 2320) Pulse Rate:  [81] 81 (12/02 2320) Resp:  [14] 14 (12/02 2320) BP: (129)/(62) 129/62 (12/02 2320) SpO2:  [96 %] 96 % (12/02 1930) Weight:  [136.8 kg] 136.8 kg (12/02 2320)  Plan: No nsgy recom at this time.   Eleonore Chiquito, NP 03/09/2018 8:24 AM

## 2018-03-10 DIAGNOSIS — S065X9A Traumatic subdural hemorrhage with loss of consciousness of unspecified duration, initial encounter: Principal | ICD-10-CM

## 2018-03-10 NOTE — Progress Notes (Signed)
I spoke with son [name] by phone regarding taking pt home with Hospice care.  He states he has spoken with Hospice representative, and they told him that pt would need 24h care at home, provided by family. [name] states that there is no way that he or the rest of his family can provide 24h care at discharge due to work schedules, and pt would be left alone for periods of time during the day and night.  May be able to do short term SNF placement, if pt willing to work with therapies.  Will discuss with CSW.    Quintella BatonJulie W. Oma Marzan, RN, BSN  Trauma/Neuro ICU Case Manager [phone]

## 2018-03-10 NOTE — Social Work (Addendum)
12:14pm- Re[facility] call [facility], per pt son they cannot provide level of assistance needed for pt to have hospice services. Requested PT/OT orders for pt to assess level of need and to place pt at SNF with palliative services for the time being.   11:36am- CSW spoke with pt son, he states that pt has been hoping for a hospice home but understands that pt does not qualify at this time. Discussed the pt's options: home with hospice services given that pt has family to check on him and provide care OR SNF with hospice services which would be private pay given pt does not want therapies or further intensive care (of note- if pt had participated with therapies then he would only have 8 days of SNF coverage through insurance if they would approve however pt presents as more custodial in nature).   Pt son states that he is the 'only one in the family that works' and that his wife is available to assist as is he because 'we only live 5 minutes down the road.'  When CSW discussed SNF placement under private pay pt son stated 'he doesn't have the money to pay for that. We talked about him rehabbing and see if he gets better that would be good, but he doesn't want to.'  CSW inquired about which hospice organization they would want and pt son requests [facility]. Hospice liaison Cheri alerted.  Octavio GravesIsabel Astou Lada, MSW, [facility]WA [place] Clinical Social Work [phone]

## 2018-03-10 NOTE — Consult Note (Signed)
[facility]--Met at bedside with pt and son Cristie Hem per SW request to assess pt for Hospice care in his home. Pt and son verbalize concerns about pt returning home due to pt's functional decline and forgetfulness.  Pt and son state that pt does not have a 24 hr caregiver.  Both feel that placement at a SNF would be the best option for the immediate future.  Staff RN Ranelle present at the bedside for this conversation.  SW--Isabel Chasse updated.  Will sign off.  Please reach out to [place] of we can assist in the future.  [phone]  Wynetta Fines, RN

## 2018-03-10 NOTE — Plan of Care (Signed)
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
## 2018-03-10 NOTE — Progress Notes (Signed)
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 fib, CVA, morbid obesity, chronic diastolic CHF, OSA OHS, recent hospitalization with sepsis acute kidney injury, bacteremia. -Patient has been declining overall, feels like his quality of life is terrible, is unable to care for himself -Wants to focus on comfort care only and is hoping for hospice type facility Assessment & Plan:   Principal Problem:   SDH (subdural hematoma) (HCC) Active Problems:   HLD (hyperlipidemia)   Essential hypertension   Paroxysmal atrial fibrillation (HCC)   Chronic intermittent hypoxia with obstructive sleep apnea   Anticoagulated on warfarin   Acute renal failure superimposed on stage 3 chronic kidney disease (HCC)   Chest wall hematoma, right, initial encounter   Goals of care, counseling/discussion   Moderate subdural hematoma Secondary to mechanical fall. Patient was on Coumadin which was discontinued. Neurosurgery consulted recommended repeating CT head which is stable with moderate sized SDH. No further intervention at this time.   History of severe aortic stenosis, TAVR, chronic atrial fibrillation All meds discontinued and he wanted to be transitioned to comfort care.   History of chronic diastolic heart failure He appears to be compensated patient is on comfort care and all meds discontinued at this time.   Adult failure to thrive due to recent multiple hospitalizations Comfort care at this time.   Goals of care patient would like to consider residential hospice.  He does not want to go home by himself, he feels he cannot take care of himself.   DVT prophylaxis: SCDs Code Status: DNR Family Communication: None at bedside  disposition Plan: Pending social worker   Consultants:   Palliative  care   Procedures: None  Antimicrobials: None  Subjective: Patient does not want to go home by himself.  He is open to residential hospice.  Objective: Vitals:   03/09/18 2000 03/10/18 0001 03/10/18 0400 03/10/18 0843  BP: (!) 137/57 (!) 119/54 (!) 92/47 111/68  Pulse: 83 97 70 66  Resp:  18 18   Temp: 98.7 F (37.1 C) 98.2 F (36.8 C) 98.5 F (36.9 C) 98 F (36.7 C)  TempSrc: Oral Oral Oral Oral  SpO2:  99% 97% 100%  Weight:      Height:        Intake/Output Summary (Last 24 hours) at 03/10/2018 1056 Last data filed at 03/10/2018 0600 Gross per 24 hour  Intake 360 ml  Output 1625 ml  Net -1265 ml   Filed Weights   03/08/18 2320  Weight: (!) 136.8 kg    Examination:  General exam: Chronically ill-appearing but does not appear to be any distress at this time Respiratory system: Clear to auscultation. Respiratory effort normal. Cardiovascular system: S1 & S2 heard, RRR. No JVD, Gastrointestinal system: Abdomen is nondistended, soft and nontender. No organomegaly or masses felt. Normal bowel sounds heard. Central nervous system: Alert and oriented.  Nonfocal Extremities: Symmetric 5 x 5 power. Skin: No rashes, lesions or ulcers Psychiatry:  Mood & affect appropriate.     Data Reviewed: I have personally reviewed following labs and imaging studies  CBC: Recent Labs  Lab 03/06/18 0447 03/06/18 1513 03/07/18 0558  WBC 15.9* 18.6* 15.9*  NEUTROABS 13.2*  --   --   HGB 11.0* 9.5* 9.1*  HCT 34.8* 30.1* 30.0*  MCV 94.8 96.2 97.4  PLT  449* 351 372   Basic Metabolic Panel: Recent Labs  Lab 03/06/18 0447 03/07/18 0558  NA 135 137  K 4.3 3.9  CL 100 103  CO2 21* 21*  GLUCOSE 122* 124*  BUN 22 21  CREATININE 2.07* 1.88*  CALCIUM 8.4* 7.6*   GFR: Estimated Creatinine Clearance: 50.9 mL/min (A) (by C-G formula based on SCr of 1.88 mg/dL (H)). Liver Function Tests: Recent Labs  Lab 03/06/18 0447  AST 19  ALT 19  ALKPHOS 92  BILITOT 0.9  PROT 7.4    ALBUMIN 2.9*   No results for input(s): LIPASE, AMYLASE in the last 168 hours. No results for input(s): AMMONIA in the last 168 hours. Coagulation Profile: Recent Labs  Lab 03/06/18 0447 03/06/18 0850 03/06/18 1543  INR 5.04* 1.11 1.14   Cardiac Enzymes: Recent Labs  Lab 03/06/18 0447  TROPONINI <0.03   BNP (last 3 results) No results for input(s): PROBNP in the last 8760 hours. HbA1C: No results for input(s): HGBA1C in the last 72 hours. CBG: No results for input(s): GLUCAP in the last 168 hours. Lipid Profile: No results for input(s): CHOL, HDL, LDLCALC, TRIG, CHOLHDL, LDLDIRECT in the last 72 hours. Thyroid Function Tests: No results for input(s): TSH, T4TOTAL, FREET4, T3FREE, THYROIDAB in the last 72 hours. Anemia Panel: No results for input(s): VITAMINB12, FOLATE, FERRITIN, TIBC, IRON, RETICCTPCT in the last 72 hours. Sepsis Labs: Recent Labs  Lab 03/06/18 0448 03/06/18 0857  LATICACIDVEN 1.63 1.91*    Recent Results (from the past 240 hour(s))  Blood culture (routine x 2)     Status: None (Preliminary result)   Collection Time: 03/06/18  4:30 AM  Result Value Ref Range Status   Specimen Description BLOOD RIGHT ARM  Final   Special Requests   Final    BOTTLES DRAWN AEROBIC ONLY Blood Culture results may not be optimal due to an excessive volume of blood received in culture bottles   Culture   Final    NO GROWTH 4 DAYS Performed at [facility], [address], [place], [place] [address]    Report Status PENDING  Incomplete  Blood culture (routine x 2)     Status: None (Preliminary result)   Collection Time: 03/06/18  4:45 AM  Result Value Ref Range Status   Specimen Description BLOOD LEFT ARM  Final   Special Requests   Final    BOTTLES DRAWN AEROBIC AND ANAEROBIC Blood Culture results may not be optimal due to an excessive volume of blood received in culture bottles   Culture   Final    NO GROWTH 4 DAYS Performed at [facility], [address], [place], [place] [address]    Report Status PENDING  Incomplete  Urine Culture     Status: None   Collection Time: 03/06/18  6:36 AM  Result Value Ref Range Status   Specimen Description URINE, RANDOM  Final   Special Requests NONE  Final   Culture   Final    NO GROWTH Performed at [facility], [address] [place], [place] [address]    Report Status 03/07/2018 FINAL  Final  MRSA PCR Screening     Status: None   Collection Time: 03/06/18 10:16 AM  Result Value Ref Range Status   MRSA by PCR NEGATIVE NEGATIVE Final    Comment:        The GeneXpert MRSA Assay (FDA approved for NASAL specimens only), is one component of a comprehensive MRSA  colonization surveillance program. It is not intended to diagnose MRSA infection nor to guide or monitor treatment for MRSA infections. Performed at [facility], [address] [place], [place] [address]          Radiology Studies: No results found.      Scheduled Meds: . acetaminophen  650 mg Oral TID  . Melatonin  4.5 mg Oral QHS  . polyethylene glycol powder  17 g Oral Daily  . sodium chloride flush  3 mL Intravenous Q12H  . traZODone  50 mg Oral QHS   Continuous Infusions: . sodium chloride 500 mL (03/06/18 0733)     LOS: 1 day    Time spent: Needs    Kathlen ModyVijaya Zineb Glade, MD [facility] Pager [phone]  If 7PM-7AM, please contact night-coverage [email or web address] Password [email or web address] 03/10/2018, 10:56 AM

## 2018-03-11 DIAGNOSIS — S20211A Contusion of right front wall of thorax, initial encounter: Secondary | ICD-10-CM

## 2018-03-11 DIAGNOSIS — G4734 Idiopathic sleep related nonobstructive alveolar hypoventilation: Secondary | ICD-10-CM

## 2018-03-11 DIAGNOSIS — I480 Paroxysmal atrial fibrillation: Secondary | ICD-10-CM

## 2018-03-11 DIAGNOSIS — I10 Essential (primary) hypertension: Secondary | ICD-10-CM

## 2018-03-11 DIAGNOSIS — G4733 Obstructive sleep apnea (adult) (pediatric): Secondary | ICD-10-CM

## 2018-03-11 LAB — CULTURE, BLOOD (ROUTINE X 2)
Culture: NO GROWTH
Culture: NO GROWTH

## 2018-03-11 MED ORDER — LORAZEPAM 2 MG/ML IJ SOLN
1.0000 mg | Freq: Four times a day (QID) | INTRAMUSCULAR | Status: DC | PRN
  Administered 2018-03-11: 1 mg via INTRAVENOUS
  Administered 2018-03-11: 2 mg via INTRAVENOUS
  Administered 2018-03-12: 1 mg via INTRAVENOUS
  Filled 2018-03-11 (×3): qty 1

## 2018-03-11 MED ORDER — KETOROLAC TROMETHAMINE 15 MG/ML IJ SOLN
15.0000 mg | Freq: Once | INTRAMUSCULAR | Status: AC
  Administered 2018-03-11: 15 mg via INTRAVENOUS
  Filled 2018-03-11: qty 1

## 2018-03-11 MED ORDER — NITROGLYCERIN 0.4 MG SL SUBL
SUBLINGUAL_TABLET | SUBLINGUAL | Status: AC
  Administered 2018-03-11: 0.4 mg
  Filled 2018-03-11: qty 1

## 2018-03-11 MED ORDER — NITROGLYCERIN 0.4 MG SL SUBL
0.4000 mg | SUBLINGUAL_TABLET | SUBLINGUAL | Status: DC | PRN
  Administered 2018-03-11: 0.4 mg via SUBLINGUAL
  Filled 2018-03-11: qty 1

## 2018-03-11 MED ORDER — LORAZEPAM 2 MG/ML IJ SOLN: 1.0000 mg | Freq: Four times a day (QID) | INTRAMUSCULAR | Status: DC

## 2018-03-11 MED ORDER — HYDROMORPHONE HCL 1 MG/ML IJ SOLN
1.0000 mg | INTRAMUSCULAR | Status: DC | PRN
  Administered 2018-03-12: 1 mg via INTRAVENOUS
  Filled 2018-03-11: qty 1

## 2018-03-11 NOTE — Progress Notes (Signed)
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 fib, CVA, morbid obesity, chronic diastolic CHF, OSA OHS, recent hospitalization with sepsis acute kidney injury, bacteremia. -Patient has been declining overall, feels like his quality of life is terrible, is unable to care for himself -Wants to focus on comfort care only and is hoping for hospice type facility Assessment & Plan:   Principal Problem:   SDH (subdural hematoma) (HCC) Active Problems:   HLD (hyperlipidemia)   Essential hypertension   Paroxysmal atrial fibrillation (HCC)   Chronic intermittent hypoxia with obstructive sleep apnea   Anticoagulated on warfarin   Acute renal failure superimposed on stage 3 chronic kidney disease (HCC)   Chest wall hematoma, right, initial encounter   Goals of care, counseling/discussion   Moderate subdural hematoma Secondary to mechanical fall. Patient was on Coumadin which was discontinued. Neurosurgery consulted recommended repeating CT head which is stable with moderate sized SDH. No further intervention at this time.   History of severe aortic stenosis, TAVR, chronic atrial fibrillation All meds discontinued and he wanted to be transitioned to comfort care.   History of chronic diastolic heart failure He appears to be compensated patient is on comfort care and all meds discontinued at this time.   Adult failure to thrive due to recent multiple hospitalizations Comfort care at this time.  Chest pain substernal not associated with any activity Sublingual nitroglycerin given twice.  IV morphine given.  EKG shows sinus rhythm   Severe right-sided shoulder pain unable to move right shoulder Pain inadequately controlled with IV morphine, will change it to IV Dilaudid for better pain control.   Patient at this time does not want any kind of interventions imaging to evaluate right shoulder pain.   In view of his 2+ maximum assistant for bed mobility and transfers patient is not safe to be discharged home as he lives by himself and he will need 24-hour assistance at this point.  He will be discharged to SNF when bed is available.  DVT prophylaxis: SCDs Code Status: DNR Family Communication: None at bedside  disposition Plan: Plan for discharge to SNF when bed is available   Consultants:   Palliative care   Procedures: None  Antimicrobials: None  Subjective: Patient reports substernal chest pain minimum relief with IV morphine and right shoulder pain and unable to move the right shoulder.  He is adamantly refusing any intervention and imaging at this time.  Objective: Vitals:   03/11/18 0000 03/11/18 0400 03/11/18 0815 03/11/18 1153  BP: (!) 129/49 (!) 109/42 (!) 124/53 (!) 129/54  Pulse:   76 63  Resp:    17  Temp: 98 F (36.7 C) 99.1 F (37.3 C) 97.8 F (36.6 C) 97.8 F (36.6 C)  TempSrc: Oral Oral Oral Oral  SpO2: 95% 97% 100% 100%  Weight:      Height:        Intake/Output Summary (Last 24 hours) at 03/11/2018 1502 Last data filed at 03/11/2018 0813 Gross per 24 hour  Intake 684 ml  Output 2700 ml  Net -2016 ml   Filed Weights   03/08/18 2320  Weight: (!) 136.8 kg    Examination:  General exam: Chronically ill-appearing in mild to moderate distress from right shoulder pain Respiratory system: Clear to auscultation. Respiratory effort normal.  No wheezing or rhonchi  cardiovascular system: S1 & S2 heard, tachycardic, regular Gastrointestinal system: Abdomen is soft, nontender, nondistended, bowel sounds are good Central nervous system: Alert and oriented.  Right arm weakness suspect limiting movement due to pain rather than actual weakness.   Extremities: Pedal edema present Skin: No rashes, lesions or ulcers Psychiatry: Very anxious    Data  Reviewed: I have personally reviewed following labs and imaging studies  CBC: Recent Labs  Lab 03/06/18 0447 03/06/18 1513 03/07/18 0558  WBC 15.9* 18.6* 15.9*  NEUTROABS 13.2*  --   --   HGB 11.0* 9.5* 9.1*  HCT 34.8* 30.1* 30.0*  MCV 94.8 96.2 97.4  PLT 449* 351 372   Basic Metabolic Panel: Recent Labs  Lab 03/06/18 0447 03/07/18 0558  NA 135 137  K 4.3 3.9  CL 100 103  CO2 21* 21*  GLUCOSE 122* 124*  BUN 22 21  CREATININE 2.07* 1.88*  CALCIUM 8.4* 7.6*   GFR: Estimated Creatinine Clearance: 50.9 mL/min (A) (by C-G formula based on SCr of 1.88 mg/dL (H)). Liver Function Tests: Recent Labs  Lab 03/06/18 0447  AST 19  ALT 19  ALKPHOS 92  BILITOT 0.9  PROT 7.4  ALBUMIN 2.9*   No results for input(s): LIPASE, AMYLASE in the last 168 hours. No results for input(s): AMMONIA in the last 168 hours. Coagulation Profile: Recent Labs  Lab 03/06/18 0447 03/06/18 0850 03/06/18 1543  INR 5.04* 1.11 1.14   Cardiac Enzymes: Recent Labs  Lab 03/06/18 0447  TROPONINI <0.03   BNP (last 3 results) No results for input(s): PROBNP in the last 8760 hours. HbA1C: No results for input(s): HGBA1C in the last 72 hours. CBG: No results for input(s): GLUCAP in the last 168 hours. Lipid Profile: No results for input(s): CHOL, HDL, LDLCALC, TRIG, CHOLHDL, LDLDIRECT in the last 72 hours. Thyroid Function Tests: No results for input(s): TSH, T4TOTAL, FREET4, T3FREE, THYROIDAB in the last 72 hours. Anemia Panel: No results for input(s): VITAMINB12, FOLATE, FERRITIN, TIBC, IRON, RETICCTPCT in the last 72 hours. Sepsis Labs: Recent Labs  Lab 03/06/18 0448 03/06/18 0857  LATICACIDVEN 1.63 1.91*    Recent Results (from the past 240 hour(s))  Blood culture (routine x 2)     Status: None   Collection Time: 03/06/18  4:30 AM  Result Value Ref Range Status   Specimen Description BLOOD RIGHT ARM  Final   Special Requests   Final    BOTTLES DRAWN AEROBIC ONLY Blood Culture  results may not be optimal due to an excessive volume of blood received in culture bottles   Culture   Final    NO GROWTH 5 DAYS Performed at [facility], [address] [place], [place] [address]    Report Status 03/11/2018 FINAL  Final  Blood culture (routine x 2)     Status: None   Collection Time: 03/06/18  4:45 AM  Result Value Ref Range Status   Specimen Description BLOOD LEFT ARM  Final   Special Requests   Final    BOTTLES DRAWN AEROBIC AND ANAEROBIC Blood Culture results may not be optimal due to an excessive volume of blood received in culture bottles   Culture   Final    NO GROWTH 5 DAYS Performed at [facility], [address], [place], [place] [address]    Report Status 03/11/2018 FINAL  Final  Urine Culture     Status: None   Collection Time: 03/06/18  6:36 AM  Result Value Ref Range  Status   Specimen Description URINE, RANDOM  Final   Special Requests NONE  Final   Culture   Final    NO GROWTH Performed at [facility], [address] [place], [place] [address]    Report Status 03/07/2018 FINAL  Final  MRSA PCR Screening     Status: None   Collection Time: 03/06/18 10:16 AM  Result Value Ref Range Status   MRSA by PCR NEGATIVE NEGATIVE Final    Comment:        The GeneXpert MRSA Assay (FDA approved for NASAL specimens only), is one component of a comprehensive MRSA colonization surveillance program. It is not intended to diagnose MRSA infection nor to guide or monitor treatment for MRSA infections. Performed at [facility], [address] [place], [place] [address]          Radiology Studies: No results found.      Scheduled Meds: . acetaminophen  650 mg Oral TID  . Melatonin  4.5 mg Oral QHS  . polyethylene glycol powder  17 g Oral Daily  . sodium chloride flush  3 mL Intravenous Q12H  . traZODone  50 mg Oral QHS   Continuous Infusions: . sodium chloride 500 mL (03/06/18 0733)     LOS: 2 days    Time  spent: Needs    Kathlen ModyVijaya Keiandre Cygan, MD [facility] Pager [phone]  If 7PM-7AM, please contact night-coverage [email or web address] Password [email or web address] 03/11/2018, 3:02 PM

## 2018-03-11 NOTE — Progress Notes (Signed)
OT Evaluation  Pt requires encouragement to participate but will benefit from rehab at SNF. Will follow acutely.    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 fib, CVA, morbid obesity, chronic diastolic CHF, OSA OHS, recent hospitalization with sepsis acute kidney injury, bacteremia.  Precautions  Precautions Fall  Home Living  Family/patient expects to be discharged to: Skilled nursing facility  Prior Function  Level of Independence Independent with assistive device(s);Needs assistance  Gait / Transfers Assistance Needed Using RW  ADL's / Homemaking Assistance Needed Aide tue/thurs for bathing and IADLs  Comments Pt states he has been at [facility] for [facility]  Communication  Communication No difficulties  Pain Assessment  Pain Assessment 0-10  Pain Score 7  Pain Location head  Pain Descriptors / Indicators Aching  Pain Intervention(s) Limited activity within patient's tolerance  Cognition  Arousal/Alertness Awake/alert  Behavior During Therapy Flat affect  Overall Cognitive Status No family/caregiver present to determine baseline cognitive functioning  Current Attention Level Selective  Following Commands Follows one step commands consistently  Awareness Emergent  Problem Solving Slow processing  Upper Extremity Assessment  Upper Extremity Assessment Generalized weakness;RUE deficits/detail  RUE Deficits / Details limited shoulder strength/ use from precious CVA   Lower Extremity Assessment  Lower Extremity Assessment Defer to PT evaluation  Cervical / Trunk Assessment  Cervical / Trunk Assessment Kyphotic;Other exceptions (R bias)  ADL  Overall ADL's  Needs assistance/impaired  Eating/Feeding Modified independent  Grooming Set up;Supervision/safety;Sitting  Upper Body Bathing Minimal assistance;Sitting  Lower Body Bathing Maximal assistance;Sit to/from stand  Upper Body Dressing  Moderate assistance;Sitting  Lower Body Dressing Maximal assistance;Sit to/from stand  Toileting- IT trainerClothing Manipulation and Hygiene Total assistance  Toileting - Clothing Manipulation Details (indicate cue type and reason) foley  Functional mobility during ADLs Maximal assistance;+2 for physical assistance (with use of Stedy)  Bed Mobility  Overal bed mobility Needs Assistance  Bed Mobility Supine to Sit  Supine to sit Max assist  Transfers  Overall transfer level Needs assistance  Transfer via Lift Equipment Stedy  Transfers Sit to/from Stand  Sit to Stand Max assist;+2 physical assistance  General transfer comment Pt was able to stand enough to get Stedy pads under buttocks.  moved pt to chair with [facility] and he stood again to get into chair. Pt fearful but was able to complete task.   Balance  Overall balance assessment Needs assistance  Sitting-balance support Feet supported;Bilateral upper extremity supported  Sitting balance-Leahy Scale Poor  Sitting balance - Comments relies on Ue support on EOB.  VSS  Standing balance support Bilateral upper extremity supported;During functional activity  Standing balance-Leahy Scale Poor  Standing balance comment relies on External support and equipment  OT - End of Session  Equipment Utilized During Treatment Gait belt  Activity Tolerance Patient tolerated treatment well  Patient left in chair;with call bell/phone within reach;with chair alarm set  Nurse Communication Mobility status;Need for lift equipment  OT  Assessment  OT Recommendation/Assessment Patient needs continued OT Services  OT Visit Diagnosis Unsteadiness on feet (R26.81);Other abnormalities of gait and mobility (R26.89);Muscle weakness (generalized) (M62.81);Repeated falls (  R29.6);Other symptoms and signs involving cognitive function;Pain  Pain - Right/Left Right (and left)  Pain - part of body  (all over; R shoulder; chest)  OT Problem List Decreased strength;Decreased range of motion;Decreased activity tolerance;Impaired balance (sitting and/or standing);Decreased coordination;Decreased cognition;Decreased safety awareness;Decreased knowledge of use of DME or AE;Decreased knowledge of precautions;Cardiopulmonary status limiting activity;Obesity;Impaired UE functional use;Pain  OT Plan  OT Frequency (ACUTE ONLY) Min 2X/week  OT Treatment/Interventions (ACUTE ONLY) Self-care/ADL training;Therapeutic exercise;Energy conservation;DME and/or AE instruction;Therapeutic activities;Cognitive remediation/compensation;Patient/family education;Balance training  AM-PAC OT "6 Clicks" Daily Activity Outcome Measure (Version 2)  Help from another person eating meals? 4  Help from another person taking care of personal grooming? 3  Help from another person toileting, which includes using toliet, bedpan, or urinal? 2  Help from another person bathing (including washing, rinsing, drying)? 2  Help from another person to put on and taking off regular upper body clothing? 2  Help from another person to put on and taking off regular lower body clothing? 1  6 Click Score 14  OT Recommendation  Follow Up Recommendations SNF;Supervision/Assistance - 24 hour  OT Equipment 3 in 1 bedside commode  Individuals Consulted  Consulted and Agree with Results and Recommendations Patient  Acute Rehab OT Goals  Patient Stated Goal to go to SNF  OT Goal Formulation With patient  Time For Goal Achievement 03/25/18  Potential to Achieve Goals Fair  OT Time  Calculation  OT Start Time (ACUTE ONLY) 1230  OT Stop Time (ACUTE ONLY) 1250  OT Time Calculation (min) 20 min  OT General Charges  $OT Visit 1 Visit  OT Evaluation  $OT Eval Moderate Complexity 1 Mod  Written Expression  Dominant Hand Left  Luisa Dago, OT/L   Acute OT Clinical Specialist Acute Rehabilitation Services Pager [phone] Office [phone]

## 2018-03-11 NOTE — Social Work (Signed)
CSW met with pt at bedside, pt aware of choices and was given CMS packet, pt son also aware of packet. Pt son has selected [facility] for SNF. Therapy notes sent and insurance approval initiated.  Westley Hummer, MSW, [place] Work [phone]

## 2018-03-11 NOTE — Progress Notes (Signed)
Patient has not been as tearful this evening , has complained with a headache and medicated once for this.  Patient stated that now he is going to a facility because he does not think he can take care of himself alone.  Support and encouragement given to patient.

## 2018-03-11 NOTE — Evaluation (Signed)
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 fib, CVA, morbid obesity, chronic diastolic CHF, OSA OHS, recent hospitalization with sepsis acute kidney injury, bacteremia.  Clinical Impression  Pt admitted with above diagnosis. Pt currently with functional limitations due to the deficits listed below (see PT Problem List). Pt was +2 max assist for bed mobility and transfers with Stedy.  Will need SNF.  Will follow acutely.  Pt will benefit from skilled PT to increase their independence and safety with mobility to allow discharge to the venue listed below.    Follow Up Recommendations SNF;Supervision/Assistance - 24 hour    Equipment Recommendations  Other (comment)(TBA)    Recommendations for Other Services       Precautions / Restrictions Precautions Precautions: Fall Restrictions Weight Bearing Restrictions: No      Mobility  Bed Mobility Overal bed mobility: Needs Assistance Bed Mobility: Supine to Sit Rolling: +2 for physical assistance;Max assist   Supine to sit: Max assist     General bed mobility comments: Pt requires BLE and trunk stability support throughout bed mobility. Pt reports increase in lightheadedness during transitional movements.   Transfers Overall transfer level: Needs assistance Equipment used: Ambulation equipment used Transfers: (P) Sit to/from Stand Sit to Stand: (P) Max assist;+2 physical assistance         General transfer comment: (P) Pt was able to stand enough to get Stedy pads under buttocks.  moved pt to chair with [facility] and he stood again to get into chair. Pt fearful but was able to complete task.   Ambulation/Gait             General Gait Details: unable  Stairs            Wheelchair Mobility     Modified Rankin (Stroke Patients Only) Modified Rankin (Stroke Patients Only) Pre-Morbid Rankin Score: Moderately severe disability Modified Rankin: Severe disability     Balance Overall balance assessment: (P) Needs assistance Sitting-balance support: (P) Feet supported;Bilateral upper extremity supported Sitting balance-Leahy Scale: (P) Poor Sitting balance - Comments: (P) relies on Ue support on EOB.  VSS   Standing balance support: (P) Bilateral upper extremity supported;During functional activity Standing balance-Leahy Scale: (P) Poor Standing balance comment: (P) relies on External support and equipment                             Pertinent Vitals/Pain Pain Assessment: 0-10 Pain Score: 7  Pain Location: head Pain Descriptors / Indicators: Aching Pain Intervention(s): Limited activity within patient's tolerance    Home Living Family/patient expects to be discharged to:: Skilled nursing facility Living Arrangements: Alone Available Help at Discharge: Family;Available PRN/intermittently Type of Home: Independent living facility([facility]) Home Access: Level entry       Home Equipment: Grab bars - tub/shower;Shower seat;Grab bars - toilet;Walker - 2 wheels;Walker - 4 wheels;Cane - single point Additional Comments: Has been falling with RW for last 3 months    Prior Function Level of Independence: Independent with assistive device(s);Needs assistance   Gait / Transfers Assistance Needed: Using RW  ADL's / Homemaking Assistance Needed: Aide tue/thurs for bathing and IADLs  Comments: Pt states he has been at I living for 16 years     Hand Dominance   Dominant Hand: Left  Extremity/Trunk Assessment   Upper Extremity Assessment Upper Extremity Assessment: Generalized weakness;RUE deficits/detail RUE Deficits / Details: limited shoulder strength/ use from precious CVA    Lower Extremity Assessment Lower Extremity Assessment: Defer to PT  evaluation    Cervical / Trunk Assessment Cervical / Trunk Assessment: Kyphotic;Other exceptions(R bias)  Communication   Communication: No difficulties  Cognition Arousal/Alertness: Awake/alert Behavior During Therapy: Flat affect Overall Cognitive Status: No family/caregiver present to determine baseline cognitive functioning Area of Impairment: Attention;Following commands;Awareness;Problem solving                   Current Attention Level: Selective   Following Commands: Follows one step commands consistently   Awareness: Emergent Problem Solving: Slow processing General Comments: Pt gets distracted easily.  Needed redirection frequently.       General Comments      Exercises     Assessment/Plan    PT Assessment Patient needs continued PT services  PT Problem List Decreased range of motion;Decreased strength;Decreased activity tolerance;Decreased coordination;Decreased mobility;Decreased balance;Decreased cognition;Decreased knowledge of use of DME;Cardiopulmonary status limiting activity       PT Treatment Interventions DME instruction;Gait training;Therapeutic activities;Functional mobility training;Therapeutic exercise;Balance training;Neuromuscular re-education;Patient/family education    PT Goals (Current goals can be found in the Care Plan section)  Acute Rehab PT Goals Patient Stated Goal: to go to SNF PT Goal Formulation: With patient Time For Goal Achievement: 03/25/18 Potential to Achieve Goals: Fair    Frequency Min 2X/week   Barriers to discharge Decreased caregiver support      Co-evaluation PT/OT/SLP Co-Evaluation/Treatment: Yes Reason for Co-Treatment: Complexity of the patient's impairments (multi-system involvement);Necessary to address cognition/behavior during functional activity;For patient/therapist safety;To address functional/ADL transfers PT goals addressed during session: Mobility/safety with mobility OT goals addressed during  session: ADL's and self-care       AM-PAC PT "6 Clicks" Mobility  Outcome Measure Help needed turning from your back to your side while in a flat bed without using bedrails?: A Lot Help needed moving from lying on your back to sitting on the side of a flat bed without using bedrails?: A Lot Help needed moving to and from a bed to a chair (including a wheelchair)?: Total Help needed standing up from a chair using your arms (e.g., wheelchair or bedside chair)?: A Lot Help needed to walk in hospital room?: Total Help needed climbing 3-5 steps with a railing? : Total 6 Click Score: 9    End of Session Equipment Utilized During Treatment: Gait belt Activity Tolerance: Patient limited by fatigue Patient left: in chair;with call bell/phone within reach;with chair alarm set Nurse Communication: Mobility status;Need for lift equipment(Stedy) PT Visit Diagnosis: Unsteadiness on feet (R26.81);Muscle weakness (generalized) (M62.81)    Time: [id number] PT Time Calculation (min) (ACUTE ONLY): 24 min   Charges:   PT Evaluation $PT Eval Moderate Complexity: 1 Mod          Asees Manfredi,PT Acute Rehabilitation Services Pager:  [phone]  Office:  [phone]    Berline LopesDawn F Shravan Salahuddin 03/11/2018, 2:13 PM

## 2018-03-12 DIAGNOSIS — N179 Acute kidney failure, unspecified: Secondary | ICD-10-CM

## 2018-03-12 DIAGNOSIS — N183 Chronic kidney disease, stage 3 (moderate): Secondary | ICD-10-CM

## 2018-03-12 MED ORDER — HYDROCODONE-ACETAMINOPHEN 5-325 MG PO TABS: 1.0000 | ORAL_TABLET | ORAL | 0 refills | Status: DC | PRN

## 2018-03-12 MED ORDER — ALPRAZOLAM 1 MG PO TABS: 1.0000 mg | ORAL_TABLET | Freq: Every evening | ORAL | 0 refills | Status: DC | PRN

## 2018-03-12 NOTE — Care Management Important Message (Signed)
Important Message  Patient Details  Name: Jorge [name] MRN: [id number] Date of Birth: 1946/02/21   Medicare Important Message Given:  Yes    Dorena BodoIris Fredia Chittenden 03/12/2018, 4:22 PM

## 2018-03-12 NOTE — Progress Notes (Signed)
Patient is set to [facility] today. Patient & son, [name], aware. Discharge packet given to RN, Crystal. [facility] called for transport.    Please call report to [phone]   Stacy GardnerErin Hayslee Casebolt, LCSW Clinical Social Worker [phone]

## 2018-03-12 NOTE — Progress Notes (Signed)
   03/12/18 1057  Clinical Encounter Type  Visited With Patient  Visit Type Follow-up  Referral From Chaplain  Consult/Referral To Chaplain  The chaplain followed up with Pt. spiritual care.  The chaplain observed the Pt.'s decline in the spiritual and physical energy present at the initial visit.  The Pt. was feeding himself, while continuously communicating his desire and willingness to have his son [name] help him make healthcare decisions.  The Pt. hoped to see his son today.  The Pt. prayers with the chaplain included strength to make the right decisions.  The chaplain will follow up with spiritual care as needed.

## 2018-03-12 NOTE — Care Management Important Message (Signed)
Important Message  Patient Details  Name: Jorge DroughtGeorge O Malicoat MRN: [id number] Date of Birth: [birth year]   Medicare Important Message Given:  Yes    Dorena BodoIris Elvera Almario 03/12/2018, 4:25 PM

## 2018-03-12 NOTE — Discharge Summary (Addendum)
Physician Discharge Summary  Jorge MENDELL ZOX:[id number] DOB: Sep 16, 1945 DOA: 03/06/2018  PCP: [name], PA-C  Admit date: 03/06/2018 Discharge date: 03/12/2018  Admitted From: Home.  Disposition:  SNF with palliative care services.   Recommendations for Outpatient Follow-up:  Follow up with hospice MD as needed.  Please follow up with palliative care services.   Discharge Condition: palliativecare CODE STATUS:DNR Diet recommendation: regular.    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 fib, CVA, morbid obesity, chronic diastolic CHF, OSA OHS, recent hospitalization with sepsis acute kidney injury, bacteremia. -Patient has been declining overall, feels like his quality of life is terrible, is unable to care for himself -Wants to focus on comfort care only   Discharge Diagnoses:  Principal Problem:   SDH (subdural hematoma) (HCC) Active Problems:   HLD (hyperlipidemia)   Essential hypertension   Paroxysmal atrial fibrillation (HCC)   Chronic intermittent hypoxia with obstructive sleep apnea   Anticoagulated on warfarin   Acute renal failure superimposed on stage 3 chronic kidney disease (HCC)   Chest wall hematoma, right, initial encounter   Goals of care, counseling/discussion  Moderate subdural hematoma Secondary to mechanical fall. Patient was on Coumadin which was discontinued. Neurosurgery consulted recommended repeating CT head which is stable with moderate sized SDH. No further intervention at this time.   History of severe aortic stenosis, TAVR, chronic atrial fibrillation All meds discontinued and he wanted to be transitioned to comfort care.   History of chronic diastolic heart failure He appears to be compensated patient is on comfort care and all meds discontinued at this time.   Adult failure to thrive due to recent multiple hospitalizations Comfort  diet.   Chest pain substernal not associated with any activity Resolved.  EKG shows sinus rhythm   Severe right-sided shoulder pain unable to move right shoulder   Patient at this time does not want any kind of interventions imaging to evaluate right shoulder pain.pain control.    In view of his 2+ maximum assistant for bed mobility and transfers patient is not safe to be discharged home as he lives by himself and he will need 24-hour assistance at this point.  He will be discharged to SNF when bed is available.  Discharge Instructions  Discharge Instructions    Diet - low sodium heart healthy   Complete by:  As directed    Discharge instructions   Complete by:  As directed    Please follow up with Hospice MD as needed.     Allergies as of 03/12/2018   No Known Allergies     Medication List    STOP taking these medications   aspirin EC 81 MG tablet   atorvastatin 40 MG tablet Commonly known as:  LIPITOR   ciprofloxacin 500 MG tablet Commonly known as:  CIPRO   levothyroxine 100 MCG tablet Commonly known as:  SYNTHROID, LEVOTHROID   metoprolol tartrate 25 MG tablet Commonly known as:  LOPRESSOR   tamsulosin 0.4 MG Caps capsule Commonly known as:  FLOMAX   traMADol 50 MG tablet Commonly known as:  ULTRAM   warfarin 5 MG tablet Commonly known as:  COUMADIN     TAKE these medications   acetaminophen 325 MG tablet Commonly known as:  TYLENOL Take 2 tablets (650 mg total) by mouth every 4 (four) hours as needed for headache or mild pain.   ALPRAZolam 1 MG tablet Commonly known as:  XANAX Take  1 tablet (1 mg total) by mouth at bedtime as needed for anxiety or sleep.   bisacodyl 5 MG EC tablet Commonly known as:  DULCOLAX Take 5 mg by mouth daily as needed for moderate constipation.   DULoxetine 60 MG capsule Commonly known as:  CYMBALTA Take 60 mg by mouth daily at 6 PM.   fluticasone 50 MCG/ACT nasal spray Commonly known as:  FLONASE Place 1 spray  into both nostrils daily as needed for allergies.   HYDROcodone-acetaminophen 5-325 MG tablet Commonly known as:  NORCO/VICODIN Take 1-2 tablets by mouth every 4 (four) hours as needed for moderate pain.   Melatonin 5 MG Tabs Take 5 mg by mouth at bedtime. (2100)   nitroGLYCERIN 0.4 MG SL tablet Commonly known as:  NITROSTAT Place 1 tablet (0.4 mg total) under the tongue every 5 (five) minutes as needed for chest pain.   omeprazole 40 MG capsule Commonly known as:  PRILOSEC Take 40 mg by mouth daily as needed (indigestion).   polyethylene glycol powder powder Commonly known as:  GLYCOLAX/MIRALAX Take 17 g by mouth daily. What changed:  additional instructions       Contact information for follow-up providers    [facility] .   Contact information: [address] [place] [address] [phone]            Contact information for after-discharge care    Destination    [facility] .   Service:  Skilled Nursing Contact information: [address] [place] [address] [phone]                 No Known Allergies  Consultations:  Palliative care.    Procedures/Studies: Ct Abdomen Pelvis Wo Contrast  Result Date: 03/06/2018 CLINICAL DATA:  Initial evaluation for acute blunt trauma. EXAM: CT CHEST, ABDOMEN AND PELVIS WITHOUT CONTRAST TECHNIQUE: Multidetector CT imaging of the chest, abdomen and pelvis was performed following the standard protocol without IV contrast. COMPARISON:  Prior radiograph from earlier the same day. FINDINGS: CT CHEST FINDINGS Cardiovascular: Limited noncontrast evaluation of the aorta demonstrates no evidence for aneurysm or other obvious acute abnormality. Moderate atheromatous plaque within the aortic arch. Partially visualized great vessels normal in caliber without obvious abnormality. Heart size normal. Prosthetic aortic valve noted. Prominent coronary artery calcifications  within the LAD. No pericardial effusion. Main pulmonary artery of normal caliber. Mediastinum/Nodes: Thyroid within normal limits. No enlarged mediastinal, hilar, or axillary lymph nodes identified. No mediastinal hematoma. Esophagus within normal limits. Lungs/Pleura: Tracheobronchial tree intact and patent. Chronic elevation of the right hemidiaphragm. Scattered atelectatic changes seen dependently within the lung bases bilaterally, right greater than left. Patchy multifocal ground-glass opacities involving the peripheral subpleural aspect of the lateral right upper lobe likely reflect pulmonary contusion (series 5, image 38). Additional mild scattered patchy and ground-glass opacities within the peripheral left upper lobe (series 5, image 39), and lingula (series 5, image 84) could reflect atelectasis or possibly small infiltrates. No pneumothorax. No worrisome pulmonary nodule or mass. No pulmonary edema or pleural effusion. Musculoskeletal: Question irregularity with focal angulation at the right scapular wing (series 5, image 16), not entirely certain, as there is motion artifact through this region. No other acute osseous abnormality identified within the thorax. Remotely healed fractures of the right anterolateral eighth and ninth ribs noted. Chronic proximal left humeral fracture partially visualize, grossly similar. Soft tissue hematoma within the subpectoral right anterolateral chest measures 6.3 x 12.2 x 13.6 cm (  series 3, image 22). Additional soft tissue stranding/contusion present within the partially visualized right chest wall. Probable mild contusion within the subcutaneous fat of the mid anterior chest wall superficial to the sternum as well (series 3, image 26). CT ABDOMEN PELVIS FINDINGS Hepatobiliary: Liver demonstrates a normal unenhanced appearance. Gallbladder normal. No biliary dilatation. Pancreas: Pancreas within normal limits. Spleen: Spleen within normal limits. Adrenals/Urinary Tract:  Adrenal glands are normal. Kidneys equal in size without nephrolithiasis or hydronephrosis. No appreciable hydroureter. Partially distended bladder within normal limits. Stomach/Bowel: Stomach within normal limits. Small 2.6 cm lipoma noted at the proximal duodenum. No evidence for bowel obstruction or acute bowel injury. No acute inflammatory changes seen about the bowels. Laxity of the right abdominal wall noted. Vascular/Lymphatic: Moderate aorto bi-iliac atherosclerotic disease. No aneurysm. No adenopathy. Reproductive: Prostate within normal limits. Other: Small bilateral fat containing inguinal hernias noted, right greater than left. No free air or fluid no mesenteric or retroperitoneal hematoma. Musculoskeletal: Visualized external soft tissues demonstrate no acute finding. No acute osseous abnormality. Prior interbody fusion at L5-S1. IMPRESSION: 1. Soft tissue hematoma within the subpectoral right anterolateral chest, measuring 6.3 x 12.2 x 13.6 cm, with additional soft tissue stranding/contusion within the partially visualized right chest wall. 2. Probable mild contusion within the subcutaneous fat of the mid anterior chest wall superficial to the sternum. No acute fracture within the thorax. 3. Patchy multifocal ground-glass opacities within the peripheral right upper lobe, most consistent with mild opacities within the left upper lobe and lingula, indeterminate, but could reflect atelectasis or infiltrates. 4. Additional patchy multifocal 5. Question focal angulation of the right scapular wing, not entirely certain, as there is motion artifact through this region. Correlation with physical exam recommended. Additionally, further assessment with plain film radiography may be helpful for further evaluation. Electronically Signed   By: Rise Mu M.D.   On: 03/06/2018 06:44   Ct Head Wo Contrast  Result Date: 03/07/2018 CLINICAL DATA:  Follow-up subdural hemorrhage. EXAM: CT HEAD WITHOUT  CONTRAST TECHNIQUE: Contiguous axial images were obtained from the base of the skull through the vertex without intravenous contrast. COMPARISON:  03/06/2018 FINDINGS: Brain: Acute right for frontoparietal convexity subdural hematoma has not significantly changed in size with maximal thickness of 12 mm. The hematoma remains predominantly hyperattenuating with scattered areas of low density internally. There is trace acute left parasagittal subdural hematoma at the vertex measuring 2 mm in thickness, better demonstrated on today's study though not substantially enlarged. There is no midline shift or other significant intracranial mass effect. No acute large territory infarct is evident. Patchy cerebral white matter hypodensities are unchanged and nonspecific but compatible with mild-to-moderate chronic small vessel ischemic disease. There is mild cerebral atrophy. Vascular: Calcified atherosclerosis at the skull base. No hyperdense vessel. Skull: No fracture or focal osseous lesion. Sinuses/Orbits: Minimal mucosal thickening in the maxillary sinuses. Trace mastoid effusions. Bilateral cataract extraction. Other: None. IMPRESSION: 1. Unchanged right frontoparietal subdural hematoma and trace left parasagittal subdural hematoma at the vertex. 2. No mass effect. 3. Mild-to-moderate chronic small vessel ischemic disease. Electronically Signed   By: Sebastian Ache M.D.   On: 03/07/2018 09:10   Ct Head Wo Contrast  Result Date: 03/06/2018 CLINICAL DATA:  Head trauma, minor, GCS>=13, high clinical risk, initial exam. Patient called EMS because he was too weak to get out of his chair. EXAM: CT HEAD WITHOUT CONTRAST TECHNIQUE: Contiguous axial images were obtained from the base of the skull through the vertex without intravenous contrast. COMPARISON:  Head  CT 02/21/2018 FINDINGS: Brain: Acute undulating right frontoparietal subdural hematoma with internal hypodensities possibly representing clot. Greatest dimension is 13  mm in the frontal region. Lesser degree of hemorrhage tracks into the right temporal lobe. There is no midline shift or mass effect. Generalized atrophy and chronic small vessel ischemia again seen. Vascular: Atherosclerosis of skullbase vasculature without hyperdense vessel or abnormal calcification. Skull: No fracture or focal lesion. Sinuses/Orbits: Paranasal sinuses are clear. Unchanged opacification of lower mastoid air cells bilaterally. No acute orbital abnormality. Other: None. IMPRESSION: 1. Acute moderate-sized right frontoparietal subdural hematoma measuring up to 13 mm in maximal dimension. No midline shift or mass effect. 2. Generalized atrophy and chronic small vessel ischemia. Critical Value/emergent results were called by telephone at the time of interpretation on 03/06/2018 at 6:17 am to Dr. Erma HeritageIsaacs, who verbally acknowledged these results and will relay these findings to Dr. Manus Gunningancour. Electronically Signed   By: Narda RutherfordMelanie  Sanford M.D.   On: 03/06/2018 06:18   Ct Head Wo Contrast  Result Date: 02/21/2018 CLINICAL DATA:  Status post fall out of bed, with concern for head injury. Initial encounter. EXAM: CT HEAD WITHOUT CONTRAST TECHNIQUE: Contiguous axial images were obtained from the base of the skull through the vertex without intravenous contrast. COMPARISON:  CT of the head performed 01/07/2018 FINDINGS: Brain: No evidence of acute infarction, hemorrhage, hydrocephalus, extra-axial collection or mass lesion / mass effect. Prominence of the ventricles and sulci reflects mild cortical volume loss. Mild subcortical white matter change likely reflects small vessel ischemic microangiopathy. The brainstem and fourth ventricle are within normal limits. The basal ganglia are unremarkable in appearance. The cerebral hemispheres demonstrate grossly normal gray-white differentiation. No mass effect or midline shift is seen. Vascular: No hyperdense vessel or unexpected calcification. Skull: There is no  evidence of fracture; multiple large maxillary dental caries are noted. Sinuses/Orbits: Mild bilateral proptosis is noted. The orbits are otherwise unremarkable. There is mild partial opacification of the mastoid air cells bilaterally. The paranasal sinuses are well-aerated. Other: No significant soft tissue abnormalities are seen. IMPRESSION: 1. No evidence of traumatic intracranial injury or fracture. 2. Mild cortical volume loss and scattered small vessel ischemic microangiopathy. 3. Mild bilateral proptosis noted. 4. Multiple large maxillary dental caries noted. 5. Mild partial opacification of the mastoid air cells bilaterally. Electronically Signed   By: Roanna RaiderJeffery  Chang M.D.   On: 02/21/2018 05:09   Ct Chest Wo Contrast  Result Date: 03/06/2018 CLINICAL DATA:  Initial evaluation for acute blunt trauma. EXAM: CT CHEST, ABDOMEN AND PELVIS WITHOUT CONTRAST TECHNIQUE: Multidetector CT imaging of the chest, abdomen and pelvis was performed following the standard protocol without IV contrast. COMPARISON:  Prior radiograph from earlier the same day. FINDINGS: CT CHEST FINDINGS Cardiovascular: Limited noncontrast evaluation of the aorta demonstrates no evidence for aneurysm or other obvious acute abnormality. Moderate atheromatous plaque within the aortic arch. Partially visualized great vessels normal in caliber without obvious abnormality. Heart size normal. Prosthetic aortic valve noted. Prominent coronary artery calcifications within the LAD. No pericardial effusion. Main pulmonary artery of normal caliber. Mediastinum/Nodes: Thyroid within normal limits. No enlarged mediastinal, hilar, or axillary lymph nodes identified. No mediastinal hematoma. Esophagus within normal limits. Lungs/Pleura: Tracheobronchial tree intact and patent. Chronic elevation of the right hemidiaphragm. Scattered atelectatic changes seen dependently within the lung bases bilaterally, right greater than left. Patchy multifocal  ground-glass opacities involving the peripheral subpleural aspect of the lateral right upper lobe likely reflect pulmonary contusion (series 5, image 38). Additional mild scattered patchy and  ground-glass opacities within the peripheral left upper lobe (series 5, image 39), and lingula (series 5, image 84) could reflect atelectasis or possibly small infiltrates. No pneumothorax. No worrisome pulmonary nodule or mass. No pulmonary edema or pleural effusion. Musculoskeletal: Question irregularity with focal angulation at the right scapular wing (series 5, image 16), not entirely certain, as there is motion artifact through this region. No other acute osseous abnormality identified within the thorax. Remotely healed fractures of the right anterolateral eighth and ninth ribs noted. Chronic proximal left humeral fracture partially visualize, grossly similar. Soft tissue hematoma within the subpectoral right anterolateral chest measures 6.3 x 12.2 x 13.6 cm (series 3, image 22). Additional soft tissue stranding/contusion present within the partially visualized right chest wall. Probable mild contusion within the subcutaneous fat of the mid anterior chest wall superficial to the sternum as well (series 3, image 26). CT ABDOMEN PELVIS FINDINGS Hepatobiliary: Liver demonstrates a normal unenhanced appearance. Gallbladder normal. No biliary dilatation. Pancreas: Pancreas within normal limits. Spleen: Spleen within normal limits. Adrenals/Urinary Tract: Adrenal glands are normal. Kidneys equal in size without nephrolithiasis or hydronephrosis. No appreciable hydroureter. Partially distended bladder within normal limits. Stomach/Bowel: Stomach within normal limits. Small 2.6 cm lipoma noted at the proximal duodenum. No evidence for bowel obstruction or acute bowel injury. No acute inflammatory changes seen about the bowels. Laxity of the right abdominal wall noted. Vascular/Lymphatic: Moderate aorto bi-iliac atherosclerotic  disease. No aneurysm. No adenopathy. Reproductive: Prostate within normal limits. Other: Small bilateral fat containing inguinal hernias noted, right greater than left. No free air or fluid no mesenteric or retroperitoneal hematoma. Musculoskeletal: Visualized external soft tissues demonstrate no acute finding. No acute osseous abnormality. Prior interbody fusion at L5-S1. IMPRESSION: 1. Soft tissue hematoma within the subpectoral right anterolateral chest, measuring 6.3 x 12.2 x 13.6 cm, with additional soft tissue stranding/contusion within the partially visualized right chest wall. 2. Probable mild contusion within the subcutaneous fat of the mid anterior chest wall superficial to the sternum. No acute fracture within the thorax. 3. Patchy multifocal ground-glass opacities within the peripheral right upper lobe, most consistent with mild opacities within the left upper lobe and lingula, indeterminate, but could reflect atelectasis or infiltrates. 4. Additional patchy multifocal 5. Question focal angulation of the right scapular wing, not entirely certain, as there is motion artifact through this region. Correlation with physical exam recommended. Additionally, further assessment with plain film radiography may be helpful for further evaluation. Electronically Signed   By: Rise Mu M.D.   On: 03/06/2018 06:44   Dg Chest Portable 1 View  Result Date: 03/06/2018 CLINICAL DATA:  Shortness of breath. EXAM: PORTABLE CHEST 1 VIEW COMPARISON:  Radiographs 02/22/2018 FINDINGS: Chronic elevation of right hemidiaphragm. Improved pulmonary edema from prior exam. Decreased blunting of right costophrenic angle likely improving pleural effusion. Unchanged heart size and mediastinal contours. No pneumothorax. Unchanged osseous structures. IMPRESSION: Improved pulmonary edema from prior exam. Decreased right pleural effusion. Chronic elevation of right hemidiaphragm. Electronically Signed   By: Narda Rutherford  M.D.   On: 03/06/2018 05:29   Dg Chest Port 1 View  Result Date: 02/22/2018 CLINICAL DATA:  Dyspnea, shortness of breath, and upper mid chest discomfort for the past 2 days. Nonproductive cough as well. History of asthma, CHF, trans catheter aortic valve replacement approximately 20 months ago. EXAM: PORTABLE CHEST 1 VIEW COMPARISON:  Portable chest x-ray of February 21, 2018 FINDINGS: The right hemidiaphragm appears higher than the left. Right basilar parenchymal density has developed. The left lung  is better inflated. The interstitial markings in the mid and lower left lung are coarse but fairly stable. The left heart border is mildly prominent. The right heart border is obscured. The pulmonary vascularity is engorged and less distinct with more cephalization. IMPRESSION: CHF with mild interstitial edema. Right basilar atelectasis or pneumonia. Probable small right pleural effusion. Thoracic aortic atherosclerosis. The appearance of the chest has deteriorated since the study of 2 days ago. Electronically Signed   By: David  [place] M.D.   On: 02/22/2018 10:02   Dg Chest Port 1 View  Result Date: 02/21/2018 CLINICAL DATA:  Status post fall out of bed tonight. Recent urinary tract infection. Shortness of breath. EXAM: PORTABLE CHEST 1 VIEW COMPARISON:  Chest radiograph performed 12/24/2017 FINDINGS: The lungs are mildly hypoexpanded. Vascular congestion is noted. Increased interstitial markings may reflect transient interstitial edema. There is no evidence of pleural effusion or pneumothorax. The cardiomediastinal silhouette is mildly enlarged. No acute osseous abnormalities are seen. IMPRESSION: Lungs mildly hypoexpanded. Vascular congestion and mild cardiomegaly. Increased interstitial markings may reflect transient interstitial edema. No displaced rib fracture seen. Electronically Signed   By: Roanna Raider M.D.   On: 02/21/2018 03:47   Ct Renal Stone Study  Result Date: 02/21/2018 CLINICAL DATA:   Status post fall out of bed, with concern for abdominal injury. Recent urinary tract infection. EXAM: CT ABDOMEN AND PELVIS WITHOUT CONTRAST TECHNIQUE: Multidetector CT imaging of the abdomen and pelvis was performed following the standard protocol without IV contrast. COMPARISON:  CT of the abdomen and pelvis performed 12/25/2017 FINDINGS: Lower chest: Mild bibasilar opacities likely reflect atelectasis. An aortic valve replacement is noted. Hepatobiliary: There is suggestion of small stones within the gallbladder. The liver is unremarkable in appearance. The common bile duct remains normal in caliber. Pancreas: The pancreas is within normal limits. Spleen: The spleen is unremarkable in appearance. Adrenals/Urinary Tract: The adrenal glands are unremarkable in appearance. Nonspecific perinephric stranding is noted bilaterally. Mild soft tissue inflammation tracks about the ureters, raising question for ureteritis. Soft tissue inflammation extends into the lower quadrants. Underlying pyelonephritis cannot be excluded. There is no evidence of hydronephrosis. No renal or ureteral stones are identified. Stomach/Bowel: The stomach is unremarkable in appearance. The small bowel is within normal limits. The appendix is not visualized; there is no evidence for appendicitis. The colon is unremarkable in appearance. Vascular/Lymphatic: Scattered calcification is seen along the abdominal aorta and its branches. The abdominal aorta is otherwise grossly unremarkable. The inferior vena cava is grossly unremarkable. No retroperitoneal lymphadenopathy is seen. No pelvic sidewall lymphadenopathy is identified. Reproductive: The bladder is decompressed and not well characterized. The prostate is borderline enlarged, measuring 4.8 cm in transverse dimension. Other: No additional soft tissue abnormalities are seen. Musculoskeletal: No acute osseous abnormalities are identified. The patient is status post interbody fusion at L5-S1.  Vacuum phenomenon is noted along the lower lumbar spine. The visualized musculature is unremarkable in appearance. IMPRESSION: 1. Mild soft tissue inflammation tracks about the ureters, raising question for ureteritis. Soft tissue inflammation extends into the lower quadrants. Underlying pyelonephritis cannot be excluded. 2. Borderline enlarged prostate. 3. Mild bibasilar airspace opacities likely reflect atelectasis. 4. Suggestion of small stones within the gallbladder. Gallbladder otherwise grossly unremarkable. Aortic Atherosclerosis (ICD10-I70.0). Electronically Signed   By: Roanna Raider M.D.   On: 02/21/2018 05:14       Subjective: No chest pain or sob.   Discharge Exam: Vitals:   03/12/18 0500 03/12/18 0819  BP: (!) 113/44 (!) 101/51  Pulse:  (!) 59  Resp:    Temp: 97.8 F (36.6 C) 98.2 F (36.8 C)  SpO2:  100%   Vitals:   03/11/18 2000 03/11/18 2300 03/12/18 0500 03/12/18 0819  BP: (!) 120/48 (!) 119/46 (!) 113/44 (!) 101/51  Pulse:    (!) 59  Resp:      Temp: (!) 97.5 F (36.4 C) 97.7 F (36.5 C) 97.8 F (36.6 C) 98.2 F (36.8 C)  TempSrc: Oral Oral Oral Axillary  SpO2:  100%  100%  Weight:      Height:        General: Pt is alert, awake, not in acute distress Cardiovascular: RRR, S1/S2 +, no rubs, no gallops Respiratory: diminished air entry at bases.  Abdominal: Soft, NT, ND, bowel sounds + Extremities: no edema, no cyanosis    The results of significant diagnostics from this hospitalization (including imaging, microbiology, ancillary and laboratory) are listed below for reference.     Microbiology: Recent Results (from the past 240 hour(s))  Blood culture (routine x 2)     Status: None   Collection Time: 03/06/18  4:30 AM  Result Value Ref Range Status   Specimen Description BLOOD RIGHT ARM  Final   Special Requests   Final    BOTTLES DRAWN AEROBIC ONLY Blood Culture results may not be optimal due to an excessive volume of blood received in culture  bottles   Culture   Final    NO GROWTH 5 DAYS Performed at [facility], [address] [place], [place] [address]    Report Status 03/11/2018 FINAL  Final  Blood culture (routine x 2)     Status: None   Collection Time: 03/06/18  4:45 AM  Result Value Ref Range Status   Specimen Description BLOOD LEFT ARM  Final   Special Requests   Final    BOTTLES DRAWN AEROBIC AND ANAEROBIC Blood Culture results may not be optimal due to an excessive volume of blood received in culture bottles   Culture   Final    NO GROWTH 5 DAYS Performed at [facility], [address], [place], [place] [address]    Report Status 03/11/2018 FINAL  Final  Urine Culture     Status: None   Collection Time: 03/06/18  6:36 AM  Result Value Ref Range Status   Specimen Description URINE, RANDOM  Final   Special Requests NONE  Final   Culture   Final    NO GROWTH Performed at [facility], [address], [place], [place] [address]    Report Status 03/07/2018 FINAL  Final  MRSA PCR Screening     Status: None   Collection Time: 03/06/18 10:16 AM  Result Value Ref Range Status   MRSA by PCR NEGATIVE NEGATIVE Final    Comment:        The GeneXpert MRSA Assay (FDA approved for NASAL specimens only), is one component of a comprehensive MRSA colonization surveillance program. It is not intended to diagnose MRSA infection nor to guide or monitor treatment for MRSA infections. Performed at [facility], [address], [place], [place] [address]      Labs: BNP (last 3 results) Recent Labs    12/12/17 1725 02/21/18 0349 03/06/18 0447  BNP 18.2 119.9* 28.6   Basic Metabolic Panel: Recent Labs  Lab 03/06/18 0447 03/07/18 0558  NA 135 137  K 4.3 3.9  CL 100 103  CO2 21* 21*  GLUCOSE  122* 124*  BUN 22 21  CREATININE 2.07* 1.88*  CALCIUM 8.4* 7.6*   Liver Function Tests: Recent Labs  Lab 03/06/18 0447  AST 19  ALT 19  ALKPHOS 92  BILITOT 0.9  PROT 7.4  ALBUMIN  2.9*   No results for input(s): LIPASE, AMYLASE in the last 168 hours. No results for input(s): AMMONIA in the last 168 hours. CBC: Recent Labs  Lab 03/06/18 0447 03/06/18 1513 03/07/18 0558  WBC 15.9* 18.6* 15.9*  NEUTROABS 13.2*  --   --   HGB 11.0* 9.5* 9.1*  HCT 34.8* 30.1* 30.0*  MCV 94.8 96.2 97.4  PLT 449* 351 372   Cardiac Enzymes: Recent Labs  Lab 03/06/18 0447  TROPONINI <0.03   BNP: Invalid input(s): POCBNP CBG: No results for input(s): GLUCAP in the last 168 hours. D-Dimer No results for input(s): DDIMER in the last 72 hours. Hgb A1c No results for input(s): HGBA1C in the last 72 hours. Lipid Profile No results for input(s): CHOL, HDL, LDLCALC, TRIG, CHOLHDL, LDLDIRECT in the last 72 hours. Thyroid function studies No results for input(s): TSH, T4TOTAL, T3FREE, THYROIDAB in the last 72 hours.  Invalid input(s): FREET3 Anemia work up No results for input(s): VITAMINB12, FOLATE, FERRITIN, TIBC, IRON, RETICCTPCT in the last 72 hours. Urinalysis    Component Value Date/Time   COLORURINE STRAW (A) 03/06/2018 0636   APPEARANCEUR CLEAR 03/06/2018 0636   LABSPEC 1.006 03/06/2018 0636   PHURINE 6.0 03/06/2018 0636   GLUCOSEU NEGATIVE 03/06/2018 0636   HGBUR LARGE (A) 03/06/2018 0636   BILIRUBINUR NEGATIVE 03/06/2018 0636   KETONESUR 5 (A) 03/06/2018 0636   PROTEINUR NEGATIVE 03/06/2018 0636   NITRITE NEGATIVE 03/06/2018 0636   LEUKOCYTESUR SMALL (A) 03/06/2018 0636   Sepsis Labs Invalid input(s): PROCALCITONIN,  WBC,  LACTICIDVEN Microbiology Recent Results (from the past 240 hour(s))  Blood culture (routine x 2)     Status: None   Collection Time: 03/06/18  4:30 AM  Result Value Ref Range Status   Specimen Description BLOOD RIGHT ARM  Final   Special Requests   Final    BOTTLES DRAWN AEROBIC ONLY Blood Culture results may not be optimal due to an excessive volume of blood received in culture bottles   Culture   Final    NO GROWTH 5 DAYS Performed  at [facility], [address] [place], [place] [address]    Report Status 03/11/2018 FINAL  Final  Blood culture (routine x 2)     Status: None   Collection Time: 03/06/18  4:45 AM  Result Value Ref Range Status   Specimen Description BLOOD LEFT ARM  Final   Special Requests   Final    BOTTLES DRAWN AEROBIC AND ANAEROBIC Blood Culture results may not be optimal due to an excessive volume of blood received in culture bottles   Culture   Final    NO GROWTH 5 DAYS Performed at [facility], [address] [place], [place] [address]    Report Status 03/11/2018 FINAL  Final  Urine Culture     Status: None   Collection Time: 03/06/18  6:36 AM  Result Value Ref Range Status   Specimen Description URINE, RANDOM  Final   Special Requests NONE  Final   Culture   Final    NO GROWTH Performed at [facility], [address], [place], [place] [address]    Report Status 03/07/2018 FINAL  Final  MRSA PCR Screening  Status: None   Collection Time: 03/06/18 10:16 AM  Result Value Ref Range Status   MRSA by PCR NEGATIVE NEGATIVE Final    Comment:        The GeneXpert MRSA Assay (FDA approved for NASAL specimens only), is one component of a comprehensive MRSA colonization surveillance program. It is not intended to diagnose MRSA infection nor to guide or monitor treatment for MRSA infections. Performed at [facility], [address], [place], [place] [address]      Time coordinating discharge: 35  minutes  SIGNED:   Kathlen Mody, MD  [facility] 03/12/2018, 11:03 AM Pager   If 7PM-7AM, please contact night-coverage [email or web address] Password [email or web address]

## 2018-03-12 NOTE — Care Management Note (Signed)
Case Management Note  Patient Details  Name: Jorge DroughtGeorge O Cassel MRN: [id number] Date of Birth: [birth year]  Subjective/Objective:    Pt admitted on 03/06/18 with SDH following fall; PMH of multiple chronic medical problems.  Pt feels he has been declining overall and that his quality of life is poor.  He has stopped all of his heart medications and desires to go to residential Hospice facility for placement, if possible.  PTA, pt lives at home alone and [facility] aide and [facility]W.                 Action/Plan: CSW following for possible Hospice Home placement, but so far pt has not been accepted at any of the area facilities, as he does not meet admitting criteria.  Per notes, pt's son should arrive in AM to assist with disposition.  May need SNF with Hospice f/u if unable to go home with Hospice care.   Expected Discharge Date:  03/12/18               Expected Discharge Plan:  Skilled Nursing Facility  In-House Referral:  Clinical Social Work  Discharge planning Services  CM Consult  Post Acute Care Choice:    Choice offered to:     DME Arranged:    DME Agency:     HH Arranged:    HH Agency:     Status of Service:  Completed, signed off  If discussed at [employer] Meetings, dates discussed:    Additional Comments:  03/12/18 J. Leonie Amacher, RN, BSN Pt medically stable for discharge today, and auth received for discharge to SNF, per CSW arrangements.  [facility].  Quintella BatonJulie W. Jalesia Loudenslager, RN, BSN  Trauma/Neuro ICU Case Manager [phone]

## 2018-04-22 ENCOUNTER — Ambulatory Visit: Payer: Self-pay | Admitting: Cardiovascular Disease

## 2018-04-27 NOTE — Progress Notes (Signed)
CARDIOLOGY OFFICE NOTE  Date:  04/30/2018    Jorge [name] Date of Birth: 10-08-1945 Medical Record #[id number]  PCP:  [name], PA-C  Cardiologist:  Eden Emms  No chief complaint on file.   History of Present Illness: Jorge [name] is a 73 y.o. 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 He has had failure to thrive with orthostasis D/C from hospital 03/12/18 after Rx for sepsis and azotemia With bacteremia. Indicated wanting comfort care only going forward and DNR  [facility]   In talking to patient now he is much better Feels more hope Wants to get out of [facility] and live on his own again Still having some hematuria and needs f/u with urology   Past Medical History:  Diagnosis Date  . Anxiety    situational -   . Asthma due to seasonal allergies   . Atrial fibrillation (HCC)    pt on Eliquis  . Chest pain   . CHF (congestive heart failure) (HCC)   . Depression    situational  . Dyspnea    when I walk a long distance  . Dysrhythmia   . Edema   . GERD (gastroesophageal reflux disease)    ocassional  . Heart murmur   . HTN (hypertension)   . Hyperlipidemia   . Hypothyroidism   . Neuropathy   . Obesity   . Peripheral vascular disease (HCC)    carotid   . Renal insufficiency 12/04/2017  . Sleep apnea   . SOB (shortness of breath)   . Stroke [facility])    Right side weakness, using a cane    Past Surgical History:  Procedure Laterality Date  . BACK SURGERY     LUmbar laminectomy  . CARDIAC CATHETERIZATION    . CARDIAC CATHETERIZATION N/A 04/30/2016   Procedure: Right/Left Heart Cath and Coronary Angiography;  Surgeon: Kathleene Hazel, MD;  Location: [facility] INVASIVE CV LAB;  Service: Cardiovascular;  Laterality: N/A;  . CAROTID ANGIOGRAM N/A 05/30/2014   Procedure: Dorise Bullion;  Surgeon: Nada Libman, MD;  Location: [facility] CATH LAB;  Service: Cardiovascular;   Laterality: N/A;  . EYE SURGERY Right    cataract  . EYE SURGERY Left    bioocular lens implanted due injury  . LUMBAR FUSION    . MULTIPLE EXTRACTIONS WITH ALVEOLOPLASTY N/A 05/02/2016   Procedure: Extraction of tooth #'s 7, 10, 23, 24, 25,and 26 with alveoloplasty.;  Surgeon: Charlynne Pander, DDS;  Location: [facility] OR;  Service: Oral Surgery;  Laterality: N/A;  . TEE WITHOUT CARDIOVERSION N/A 05/06/2016   Procedure: TRANSESOPHAGEAL ECHOCARDIOGRAM (TEE);  Surgeon: Kathleene Hazel, MD;  Location: [facility] OR;  Service: Open Heart Surgery;  Laterality: N/A;  . TRANSCAROTID ARTERY REVASCULARIZATION (TCAR)  01/15/2018   TRANSCAROTID ARTERY REVASCULARIZATION LEFT   . TRANSCAROTID ARTERY REVASCULARIZATION Left 01/15/2018   Procedure: TRANSCAROTID ARTERY REVASCULARIZATION LEFT;  Surgeon: Sherren Kerns, MD;  Location: [facility] OR;  Service: Vascular;  Laterality: Left;  . TRANSCATHETER AORTIC VALVE REPLACEMENT, TRANSFEMORAL N/A 05/06/2016   Procedure: TRANSCATHETER AORTIC VALVE REPLACEMENT, TRANSFEMORAL using a 23mm Edwards Sapien 3 Transcatheter Heart Valve;  Surgeon: Kathleene Hazel, MD;  Location: [facility] OR;  Service: Open Heart Surgery;  Laterality: N/A;     Medications: Current Meds  Medication Sig  . acetaminophen (TYLENOL) 325 MG tablet Take 2 tablets (650 mg total) by mouth every  4 (four) hours as needed for headache or mild pain.  Marland Kitchen ALPRAZolam (XANAX) 1 MG tablet Take 1 tablet (1 mg total) by mouth at bedtime as needed for anxiety or sleep.  . bisacodyl (DULCOLAX) 5 MG EC tablet Take 5 mg by mouth daily as needed for moderate constipation.  . DULoxetine (CYMBALTA) 60 MG capsule Take 60 mg by mouth daily at 6 PM.   . fluticasone (FLONASE) 50 MCG/ACT nasal spray Place 1 spray into both nostrils daily as needed for allergies.   Marland Kitchen HYDROcodone-acetaminophen (NORCO/VICODIN) 5-325 MG tablet Take 1-2 tablets by mouth every 4 (four) hours as needed for moderate pain.  . Melatonin 5 MG TABS Take 5  mg by mouth at bedtime. (2100)  . nitroGLYCERIN (NITROSTAT) 0.4 MG SL tablet Place 1 tablet (0.4 mg total) under the tongue every 5 (five) minutes as needed for chest pain.  Marland Kitchen omeprazole (PRILOSEC) 40 MG capsule Take 40 mg by mouth daily as needed (indigestion).   . polyethylene glycol powder (GLYCOLAX/MIRALAX) powder Take 17 g by mouth daily. (Patient taking differently: Take 17 g by mouth daily. (0800))     Allergies: No Known Allergies  Social History: The patient  reports that he has quit smoking. He has never used smokeless tobacco. He reports that he does not drink alcohol or use drugs.   Family History: The patient's family history includes Heart attack in his mother; Stroke in his father.   Review of Systems: Please see the history of present illness.   Otherwise, the review of systems is positive for none.   All other systems are reviewed and negative.   Physical Exam: VS:  BP 140/72   Pulse 65   Ht 6' (1.829 m)   Wt 233 lb 1.9 oz (105.7 kg)   SpO2 95%   BMI 31.62 kg/m  .  BMI Body mass index is 31.62 kg/m.  Wt Readings from Last 3 Encounters:  04/30/18 233 lb 1.9 oz (105.7 kg)  03/08/18 (!) 301 lb 9.4 oz (136.8 kg)  02/21/18 (!) 315 lb 11.2 oz (143.2 kg)   Depressed  Chronically ill obese male  HEENT: normal Neck supple with no adenopathy JVP normal post left CEA  Lungs clear with no wheezing and good diaphragmatic motion Heart:  S1/S2 no murmur, no rub, gallop or click PMI normal Abdomen: benighn, BS positve, no tenderness, no AAA no bruit.  No HSM or HJR Distal pulses intact with no bruits No edema Neuro non-focal Skin warm and dry No muscular weakness    LABORATORY DATA:  EKG:   03/11/18 SB rate 64 nonspecific ST changes   Lab Results  Component Value Date   WBC 15.9 (H) 03/07/2018   HGB 9.1 (L) 03/07/2018   HCT 30.0 (L) 03/07/2018   PLT 372 03/07/2018   GLUCOSE 124 (H) 03/07/2018   CHOL 206 (H) 12/04/2017   TRIG 198 (H) 12/04/2017   HDL 44  12/04/2017   LDLCALC 122 (H) 12/04/2017   ALT 19 03/06/2018   AST 19 03/06/2018   NA 137 03/07/2018   K 3.9 03/07/2018   CL 103 03/07/2018   CREATININE 1.88 (H) 03/07/2018   BUN 21 03/07/2018   CO2 21 (L) 03/07/2018   TSH 2.726 12/12/2017   INR 1.14 03/06/2018   HGBA1C 6.5 (H) 12/12/2017     BNP (last 3 results) Recent Labs    12/12/17 1725 02/21/18 0349 03/06/18 0447  BNP 18.2 119.9* 28.6    ProBNP (last 3 results) No results  for input(s): PROBNP in the last 8760 hours.   Other Studies Reviewed Today:  Echo 12/04/17 Left ventricle: The cavity size was normal. There was moderate concentric hypertrophy. Systolic function was normal. The estimated ejection fraction was in the range of 60% to 65%. Wall motion was normal; there were no regional wall motion abnormalities. Doppler parameters are consistent with abnormal left ventricular relaxation (grade 1 diastolic dysfunction). - Aortic valve: A prosthesis was present and functioning normally. The prosthesis had a normal range of motion. The sewing ring appeared normal, had no rocking motion, and showed no evidence of dehiscence. Mean gradient (S): 4 mm Hg. Peak gradient (S): 7 mm Hg. Valve area (VTI): 2.12 cm^2. Valve area (Vmax): 2.2 cm^2. Valve area (Vmean): 2.54 cm^2. - Mitral valve: Valve area by pressure half-time: 2.06 cm^2. - Right ventricle: The cavity size was mildly dilated. Wall thickness was normal. - Tricuspid valve: There was no significant regurgitation. - Pulmonic valve: There was no significant regurgitation.  Impressions:  - Normal LV EF without wall motion abnormalities. Echo contrast (definity) used to enhance wall borders. S/P TAVR, without obvious insufficiency and normal gradients.  Myoview IMPRESSION 11/2017: 1. No reversible ischemia or infarction.  2. Normal left ventricular wall motion.  3. Left ventricular ejection fraction 64%  4. Non invasive risk  stratification*: Low  Electronically Signed   By: Genevive BiStewart  Edmunds M.D.   On: 12/05/2017 11:18   Right/Left Heart Cath and Coronary Angiography 04/2016  Conclusion     Ost Cx to Prox Cx lesion, 40 %stenosed.  There is severe aortic valve stenosis.  Hemodynamic findings consistent with aortic valve stenosis.   1. Mild non-obstructive CAD 2. Severe aortic stenosis (mean gradient 33.377mmHg, AVA 1.02 cm2).   Recommendations: It is likely that his syncope is due to his aortic stenosis. I will ask the primary team to call CT surgery in the am. He may not be a good candidate for surgical AVR. Dr. Cornelius Moraswen or Dr. Laneta SimmersBartle should be asked to see him so they can discuss AVR vs TAVR. If he is not felt to be a surgical candidate, I will be glad to see him in consultation as well to discuss TAVR.      Assessment/Plan:  1. CAD - nonobstructive by  Cath 04/30/16   Non ischemic myovue 12/05/17   2. TAVR:  TTE done 12/04/17 mean gradient 4 peak 7 mmHg no PVL continue SBE prophylaxis   3. PAF - on amiodarone and coumadin. Unclear if the facility is checking INR. Requires lovenox bridging    4. Chronic diastolic HF  5. Morbid obesity  6. OSA - tells me he is not able to wear CPAP  7. Carotid:  Duplex done 01/28/18 60-79% RICA stenosis patent left sided stent  F/u duplex in a year   Current medicines are reviewed with the patient today.  The patient does not have concerns regarding medicines other than what has been noted above.  The following changes have been made:  See above.  Labs/ tests ordered today include:    No orders of the defined types were placed in this encounter.    Disposition:   FU in a year with carotid and TTE   Patient is agreeable to this plan and will call if any problems develop in the interim.   Signed: Charlton HawsPeter Shelda Truby, MD  04/30/2018 11:08 AM  [facility] [address] [place], [place]  [address] Phone: [phone]  [phone] Fax: [phone]) [phone]

## 2018-04-30 ENCOUNTER — Ambulatory Visit (INDEPENDENT_AMBULATORY_CARE_PROVIDER_SITE_OTHER): Payer: Medicare Other | Admitting: Cardiovascular Disease

## 2018-04-30 ENCOUNTER — Encounter (INDEPENDENT_AMBULATORY_CARE_PROVIDER_SITE_OTHER): Payer: Self-pay

## 2018-04-30 ENCOUNTER — Encounter: Payer: Self-pay | Admitting: Cardiovascular Disease

## 2018-04-30 VITALS — BP 140/72 | HR 65 | Ht 72.0 in | Wt 233.1 lb

## 2018-04-30 DIAGNOSIS — I739 Peripheral vascular disease, unspecified: Secondary | ICD-10-CM

## 2018-04-30 DIAGNOSIS — I779 Disorder of arteries and arterioles, unspecified: Secondary | ICD-10-CM

## 2018-04-30 DIAGNOSIS — Z952 Presence of prosthetic heart valve: Secondary | ICD-10-CM

## 2018-04-30 NOTE — Patient Instructions (Signed)
Medication Instructions:   If you need a refill on your cardiac medications before your next appointment, please call your pharmacy.   Lab work:  If you have labs (blood work) drawn today and your tests are completely normal, you will receive your results only by: Marland Kitchen MyChart Message (if you have MyChart) OR . A paper copy in the mail If you have any lab test that is abnormal or we need to change your treatment, we will call you to review the results.  Testing/Procedures: Your physician has requested that you have a carotid duplex in one year. This test is an ultrasound of the carotid arteries in your neck. It looks at blood flow through these arteries that supply the brain with blood. Allow one hour for this exam. There are no restrictions or special instructions.  Your physician has requested that you have an echocardiogram in one year. Echocardiography is a painless test that uses sound waves to create images of your heart. It provides your doctor with information about the size and shape of your heart and how well your heart's chambers and valves are working. This procedure takes approximately one hour. There are no restrictions for this procedure.  Follow-Up: At [facility], you and your health needs are our priority.  As part of our continuing mission to provide you with exceptional heart care, we have created designated Provider Care Teams.  These Care Teams include your primary Cardiologist (physician) and Advanced Practice Providers (APPs -  Physician Assistants and Nurse Practitioners) who all work together to provide you with the care you need, when you need it. You will need a follow up appointment in 1 years.  Please call our office 2 months in advance to schedule this appointment.  You may see Charlton Haws, MD or one of the following Advanced Practice Providers on your designated Care Team:   Norma Fredrickson, NP Nada Boozer, NP . Georgie Chard, NP

## 2018-07-30 ENCOUNTER — Encounter (HOSPITAL_COMMUNITY): Payer: Self-pay

## 2018-07-30 ENCOUNTER — Ambulatory Visit: Payer: Self-pay | Admitting: Family

## 2018-08-19 ENCOUNTER — Ambulatory Visit: Payer: Self-pay | Admitting: Adult Health

## 2019-04-25 ENCOUNTER — Other Ambulatory Visit: Payer: Self-pay

## 2019-04-25 ENCOUNTER — Ambulatory Visit (HOSPITAL_COMMUNITY): Payer: Medicare Other | Attending: Cardiovascular Disease

## 2019-04-25 DIAGNOSIS — Z952 Presence of prosthetic heart valve: Secondary | ICD-10-CM | POA: Diagnosis present

## 2019-04-25 MED ORDER — PERFLUTREN LIPID MICROSPHERE
1.0000 mL | INTRAVENOUS | Status: AC | PRN
  Administered 2019-04-25: 2 mL via INTRAVENOUS

## 2019-04-26 ENCOUNTER — Telehealth: Payer: Self-pay | Admitting: Cardiovascular Disease

## 2019-04-26 ENCOUNTER — Encounter: Payer: Self-pay | Admitting: Cardiovascular Disease

## 2019-04-26 NOTE — Telephone Encounter (Signed)
Follow up    Pt is returning call for Jorge [name]   Please call back

## 2019-04-26 NOTE — Telephone Encounter (Signed)
Left message for patient to call back  

## 2019-04-26 NOTE — Telephone Encounter (Signed)
Patient is returning call stating that he is requesting his results from echocardiogram on 04/25/19. Please call.

## 2019-04-26 NOTE — Telephone Encounter (Signed)
Patient aware of echo results.

## 2019-04-26 NOTE — Telephone Encounter (Signed)
error 

## 2019-05-02 ENCOUNTER — Ambulatory Visit (HOSPITAL_COMMUNITY)
Admission: RE | Admit: 2019-05-02 | Discharge: 2019-05-02 | Disposition: A | Discharge status: Home / Self Care | Payer: Medicare Other | Source: Ambulatory Visit | Attending: Cardiology | Admitting: Cardiology

## 2019-05-02 ENCOUNTER — Other Ambulatory Visit (HOSPITAL_COMMUNITY): Payer: Self-pay | Admitting: Cardiovascular Disease

## 2019-05-02 ENCOUNTER — Telehealth: Payer: Self-pay

## 2019-05-02 ENCOUNTER — Other Ambulatory Visit: Payer: Self-pay

## 2019-05-02 DIAGNOSIS — Z9889 Other specified postprocedural states: Secondary | ICD-10-CM

## 2019-05-02 DIAGNOSIS — Z95828 Presence of other vascular implants and grafts: Secondary | ICD-10-CM

## 2019-05-02 DIAGNOSIS — I779 Disorder of arteries and arterioles, unspecified: Secondary | ICD-10-CM | POA: Diagnosis present

## 2019-05-02 DIAGNOSIS — I6521 Occlusion and stenosis of right carotid artery: Secondary | ICD-10-CM

## 2019-05-02 DIAGNOSIS — I6523 Occlusion and stenosis of bilateral carotid arteries: Secondary | ICD-10-CM

## 2019-05-02 NOTE — Telephone Encounter (Signed)
Patient aware of results. Will order carotid for 6 months.

## 2019-05-02 NOTE — Telephone Encounter (Signed)
-----   Message from Wendall Stade, MD sent at 05/02/2019  3:10 PM EST ----- 60-79% RICA stenosis.  F/U carotid duplex in 6 months Also has right subclavian steal should f/u with Dr Darrick Penna who  Did his left carotid stent

## 2019-05-04 ENCOUNTER — Telehealth (HOSPITAL_COMMUNITY): Payer: Self-pay

## 2019-05-04 NOTE — Telephone Encounter (Signed)

## 2019-05-05 ENCOUNTER — Other Ambulatory Visit: Payer: Self-pay

## 2019-05-05 ENCOUNTER — Encounter: Payer: Self-pay | Admitting: Vascular Surgery

## 2019-05-05 ENCOUNTER — Ambulatory Visit (INDEPENDENT_AMBULATORY_CARE_PROVIDER_SITE_OTHER): Payer: Medicare Other | Admitting: Vascular Surgery

## 2019-05-05 VITALS — BP 114/70 | HR 81 | Temp 97.7°F | Resp 20 | Ht 72.0 in | Wt 309.0 lb

## 2019-05-05 DIAGNOSIS — I771 Stricture of artery: Secondary | ICD-10-CM

## 2019-05-05 DIAGNOSIS — I6522 Occlusion and stenosis of left carotid artery: Secondary | ICD-10-CM

## 2019-05-05 MED ORDER — ASPIRIN EC 81 MG PO TBEC: 81.0000 mg | DELAYED_RELEASE_TABLET | Freq: Every day | ORAL | 2 refills | Status: DC

## 2019-05-05 MED ORDER — ROSUVASTATIN CALCIUM 10 MG PO TABS: 10.0000 mg | ORAL_TABLET | Freq: Every day | ORAL | 11 refills | Status: DC
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
## 2019-09-05 ENCOUNTER — Emergency Department (HOSPITAL_COMMUNITY): Payer: Medicare Other

## 2019-09-05 ENCOUNTER — Encounter (HOSPITAL_COMMUNITY): Payer: Self-pay

## 2019-09-05 ENCOUNTER — Other Ambulatory Visit: Payer: Self-pay

## 2019-09-05 ENCOUNTER — Emergency Department (HOSPITAL_COMMUNITY)
Admission: EM | Admit: 2019-09-05 | Discharge: 2019-09-05 | Disposition: A | Discharge status: Home / Self Care | Payer: Medicare Other | Attending: Emergency Medicine | Admitting: Emergency Medicine

## 2019-09-05 DIAGNOSIS — N183 Chronic kidney disease, stage 3 unspecified: Secondary | ICD-10-CM | POA: Insufficient documentation

## 2019-09-05 DIAGNOSIS — I130 Hypertensive heart and chronic kidney disease with heart failure and stage 1 through stage 4 chronic kidney disease, or unspecified chronic kidney disease: Secondary | ICD-10-CM | POA: Insufficient documentation

## 2019-09-05 DIAGNOSIS — Z87891 Personal history of nicotine dependence: Secondary | ICD-10-CM | POA: Insufficient documentation

## 2019-09-05 DIAGNOSIS — Z8673 Personal history of transient ischemic attack (TIA), and cerebral infarction without residual deficits: Secondary | ICD-10-CM | POA: Insufficient documentation

## 2019-09-05 DIAGNOSIS — R109 Unspecified abdominal pain: Secondary | ICD-10-CM | POA: Diagnosis not present

## 2019-09-05 DIAGNOSIS — I5032 Chronic diastolic (congestive) heart failure: Secondary | ICD-10-CM | POA: Diagnosis not present

## 2019-09-05 DIAGNOSIS — N390 Urinary tract infection, site not specified: Secondary | ICD-10-CM

## 2019-09-05 DIAGNOSIS — Z7982 Long term (current) use of aspirin: Secondary | ICD-10-CM | POA: Insufficient documentation

## 2019-09-05 DIAGNOSIS — R300 Dysuria: Secondary | ICD-10-CM

## 2019-09-05 DIAGNOSIS — Z79899 Other long term (current) drug therapy: Secondary | ICD-10-CM | POA: Insufficient documentation

## 2019-09-05 DIAGNOSIS — N50819 Testicular pain, unspecified: Secondary | ICD-10-CM | POA: Diagnosis not present

## 2019-09-05 DIAGNOSIS — I2510 Atherosclerotic heart disease of native coronary artery without angina pectoris: Secondary | ICD-10-CM | POA: Insufficient documentation

## 2019-09-05 LAB — CBC WITH DIFFERENTIAL/PLATELET
Abs Immature Granulocytes: 0.05 10*3/uL (ref 0.00–0.07)
Basophils Absolute: 0 10*3/uL (ref 0.0–0.1)
Basophils Relative: 1 %
Eosinophils Absolute: 0.3 10*3/uL (ref 0.0–0.5)
Eosinophils Relative: 3 %
HCT: 48 % (ref 39.0–52.0)
Hemoglobin: 15.7 g/dL (ref 13.0–17.0)
Immature Granulocytes: 1 %
Lymphocytes Relative: 19 %
Lymphs Abs: 1.5 10*3/uL (ref 0.7–4.0)
MCH: 30.4 pg (ref 26.0–34.0)
MCHC: 32.7 g/dL (ref 30.0–36.0)
MCV: 93 fL (ref 80.0–100.0)
Monocytes Absolute: 1.2 10*3/uL — ABNORMAL HIGH (ref 0.1–1.0)
Monocytes Relative: 15 %
Neutro Abs: 4.9 10*3/uL (ref 1.7–7.7)
Neutrophils Relative %: 61 %
Platelets: 243 10*3/uL (ref 150–400)
RBC: 5.16 MIL/uL (ref 4.22–5.81)
RDW: 13.2 % (ref 11.5–15.5)
WBC: 8 10*3/uL (ref 4.0–10.5)
nRBC: 0 % (ref 0.0–0.2)

## 2019-09-05 LAB — URINALYSIS, ROUTINE W REFLEX MICROSCOPIC
Bilirubin Urine: NEGATIVE
Glucose, UA: NEGATIVE mg/dL
Ketones, ur: NEGATIVE mg/dL
Nitrite: NEGATIVE
Protein, ur: NEGATIVE mg/dL
Specific Gravity, Urine: 1.006 (ref 1.005–1.030)
WBC, UA: >50 WBC/hpf — ABNORMAL HIGH (ref 0–5)
pH: 5 (ref 5.0–8.0)

## 2019-09-05 LAB — LACTIC ACID, PLASMA: Lactic Acid, Venous: 1.3 mmol/L (ref 0.5–1.9)

## 2019-09-05 LAB — COMPREHENSIVE METABOLIC PANEL
ALT: 22 U/L (ref 0–44)
AST: 21 U/L (ref 15–41)
Albumin: 3.5 g/dL (ref 3.5–5.0)
Alkaline Phosphatase: 83 U/L (ref 38–126)
Anion gap: 11 (ref 5–15)
BUN: 27 mg/dL — ABNORMAL HIGH (ref 8–23)
CO2: 21 mmol/L — ABNORMAL LOW (ref 22–32)
Calcium: 8.8 mg/dL — ABNORMAL LOW (ref 8.9–10.3)
Chloride: 103 mmol/L (ref 98–111)
Creatinine, Ser: 1.76 mg/dL — ABNORMAL HIGH (ref 0.61–1.24)
GFR calc Af Amer: 43 mL/min — ABNORMAL LOW (ref >60)
GFR calc non Af Amer: 37 mL/min — ABNORMAL LOW (ref >60)
Glucose, Bld: 123 mg/dL — ABNORMAL HIGH (ref 70–99)
Potassium: 4.1 mmol/L (ref 3.5–5.1)
Sodium: 135 mmol/L (ref 135–145)
Total Bilirubin: 0.6 mg/dL (ref 0.3–1.2)
Total Protein: 7.5 g/dL (ref 6.5–8.1)

## 2019-09-05 MED ORDER — SODIUM CHLORIDE 0.9% FLUSH
3.0000 mL | Freq: Once | INTRAVENOUS | Status: AC
  Administered 2019-09-05: 3 mL via INTRAVENOUS

## 2019-09-05 MED ORDER — SULFAMETHOXAZOLE-TRIMETHOPRIM 800-160 MG PO TABS: 1.0000 | ORAL_TABLET | Freq: Two times a day (BID) | ORAL | 0 refills | Status: AC

## 2019-09-05 NOTE — ED Notes (Signed)
Pt transported to ultrasound.
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
## 2019-09-05 NOTE — ED Notes (Signed)
Pt to CT at this time.

## 2019-09-05 NOTE — ED Provider Notes (Signed)
Signout received from outgoing provider, agree with history and physical.  Stable.  Dysuria with flank pain.  CT stone study ordered.  If positive consider changing antibiotics.  Physical Exam  BP (!) 107/94 (BP Location: Left Wrist)   Pulse 62   Temp 98.2 F (36.8 C) (Oral)   Resp 18   Ht 6' (1.829 m)   Wt (!) 141.5 kg   SpO2 98%   BMI 42.31 kg/m   Physical Exam Vitals and nursing note reviewed.  Constitutional:      Appearance: He is well-developed.  HENT:     Head: Normocephalic and atraumatic.  Eyes:     Conjunctiva/sclera: Conjunctivae normal.  Cardiovascular:     Rate and Rhythm: Normal rate.     Heart sounds: No murmur.  Pulmonary:     Effort: Pulmonary effort is normal. No respiratory distress.  Abdominal:     General: There is no distension.  Musculoskeletal:     Cervical back: Neck supple.  Skin:    General: Skin is warm and dry.  Neurological:     General: No focal deficit present.     Mental Status: He is alert.  Psychiatric:        Mood and Affect: Mood normal.        Behavior: Behavior normal.     ED Course/Procedures      Procedures  MDM   Differential diagnosis: Kidney stone, urinary tract infection, dependent edema of the scrotum, hydrocele  ED physician interpretation of imaging: No signs of kidney stone on CT scan.  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  Mild elevation of creatinine, at baseline.  Results for orders placed or performed during the hospital encounter of 09/05/19  Lactic acid, plasma  Result Value Ref Range   Lactic Acid, Venous 1.3 0.5 - 1.9 mmol/L  Comprehensive metabolic panel  Result Value Ref Range   Sodium 135 135 - 145 mmol/L   Potassium 4.1 3.5 - 5.1 mmol/L   Chloride 103 98 - 111 mmol/L   CO2 21 (L) 22 - 32 mmol/L   Glucose, Bld 123 (H) 70 - 99 mg/dL   BUN 27 (H) 8 - 23 mg/dL   Creatinine, Ser 1.76 (H) 0.61  - 1.24 mg/dL   Calcium 8.8 (L) 8.9 - 10.3 mg/dL   Total Protein 7.5 6.5 - 8.1 g/dL   Albumin 3.5 3.5 - 5.0 g/dL   AST 21 15 - 41 U/L   ALT 22 0 - 44 U/L   Alkaline Phosphatase 83 38 - 126 U/L   Total Bilirubin 0.6 0.3 - 1.2 mg/dL   GFR calc non Af Amer 37 (L) >60 mL/min   GFR calc Af Amer 43 (L) >60 mL/min   Anion gap 11 5 - 15  CBC with Differential  Result Value Ref Range   WBC 8.0 4.0 - 10.5 K/uL   RBC 5.16 4.22 - 5.81 MIL/uL   Hemoglobin 15.7 13.0 - 17.0 g/dL   HCT 48.0 39.0 - 52.0 %   MCV 93.0 80.0 - 100.0 fL   MCH 30.4 26.0 - 34.0 pg   MCHC 32.7 30.0 - 36.0 g/dL   RDW 13.2 11.5 - 15.5 %   Platelets 243 150 - 400 K/uL   nRBC 0.0 0.0 - 0.2 %   Neutrophils Relative % 61 %   Neutro Abs 4.9 1.7 - 7.7 K/uL   Lymphocytes Relative 19 %  Lymphs Abs 1.5 0.7 - 4.0 K/uL   Monocytes Relative 15 %   Monocytes Absolute 1.2 (H) 0.1 - 1.0 K/uL   Eosinophils Relative 3 %   Eosinophils Absolute 0.3 0.0 - 0.5 K/uL   Basophils Relative 1 %   Basophils Absolute 0.0 0.0 - 0.1 K/uL   Immature Granulocytes 1 %   Abs Immature Granulocytes 0.05 0.00 - 0.07 K/uL  Urinalysis, Routine w reflex microscopic  Result Value Ref Range   Color, Urine YELLOW YELLOW   APPearance CLEAR CLEAR   Specific Gravity, Urine 1.006 1.005 - 1.030   pH 5.0 5.0 - 8.0   Glucose, UA NEGATIVE NEGATIVE mg/dL   Hgb urine dipstick MODERATE (A) NEGATIVE   Bilirubin Urine NEGATIVE NEGATIVE   Ketones, ur NEGATIVE NEGATIVE mg/dL   Protein, ur NEGATIVE NEGATIVE mg/dL   Nitrite NEGATIVE NEGATIVE   Leukocytes,Ua LARGE (A) NEGATIVE   RBC / HPF 0-5 0 - 5 RBC/hpf   WBC, UA >50 (H) 0 - 5 WBC/hpf   Bacteria, UA RARE (A) NONE SEEN   Squamous Epithelial / LPF 0-5 0 - 5   Mucus PRESENT    CT Renal Stone Study  Result Date: 09/05/2019 CLINICAL DATA:  Suprapubic abdominal pain nonshadowing up to the abdomen. EXAM: CT ABDOMEN AND PELVIS WITHOUT CONTRAST TECHNIQUE: Multidetector CT imaging of the abdomen and pelvis was performed  following the standard protocol without IV contrast. COMPARISON:  05/06/2017 FINDINGS: Lower chest: Right basilar airspace disease likely reflecting atelectasis. Hepatobiliary: No focal liver abnormality is seen. No gallstones, gallbladder wall thickening, or biliary dilatation. Pancreas: Unremarkable. No pancreatic ductal dilatation or surrounding inflammatory changes. Spleen: Normal in size without focal abnormality. Adrenals/Urinary Tract: No adrenal hemorrhage or renal injury identified. Bladder is unremarkable. 12 mm hypodense exophytic indeterminate right interpolar renal mass similar in appearance to the prior examination of 03/06/2018. Normal bladder. Stomach/Bowel: Stomach is within normal limits. Appendix appears normal. No evidence of bowel wall thickening, distention, or inflammatory changes. Vascular/Lymphatic: Normal caliber abdominal aorta with mild atherosclerosis. No lymphadenopathy. Reproductive: Prostate is unremarkable. Other: Small right inguinal fat containing hernia.  No ascites. Musculoskeletal: No acute osseous abnormality. No aggressive osseous lesion. Degenerative disease with disc height loss at L2-3, L3-4, L4-5 with bilateral facet arthropathy. Interbody fusion at L5-S1 with severe bilateral facet arthropathy. Bilateral foraminal stenosis at L2-3, L3-4, L4-5 and L5-S1. IMPRESSION: 1. No acute abdominal or pelvic pathology. 2. Aortic Atherosclerosis (ICD10-I70.0). 3. Indeterminate 12 mm right interpolar renal mass. Recommend a renal mass protocol MRI or CT for further characterization. 4. Lumbar spine spondylosis as described above. Electronically Signed   By: Elige Ko   On: 09/05/2019 16:28   US SCROTUM W/DOPPLER  Result Date: 09/05/2019 CLINICAL DATA:  Scrotal pain and swelling for 1 week. EXAM: SCROTAL ULTRASOUND DOPPLER ULTRASOUND OF THE TESTICLES TECHNIQUE: Complete ultrasound examination of the testicles, epididymis, and other scrotal structures was performed. Color and  spectral Doppler ultrasound were also utilized to evaluate blood flow to the testicles. COMPARISON:  None. FINDINGS: Right testicle Measurements: 3.7 x 2.0 x 2.4 cm. No mass or microlithiasis visualized. Internal blood flow seen on color Doppler ultrasound. Left testicle Measurements: 4.1 x 2.2 x 2.2 cm. No mass or microlithiasis visualized. Internal blood flow seen on color Doppler ultrasound. Right epididymis: 6 mm cyst or spermatocele is seen in the epididymal head. Left epididymis: A 1.7 cm cyst or spermatocele is seen in the epididymal head. Hydrocele:  Large right hydrocele and small left hydrocele noted. Varicocele:  None  visualized. Pulsed Doppler interrogation of both testes demonstrates normal low resistance arterial and venous waveforms bilaterally. IMPRESSION: No evidence of testicular mass or torsion. Large right hydrocele and small left hydrocele. Bilateral epididymal cysts or spermatoceles, left side larger than right. Electronically Signed   By: Danae Orleans M.D.   On: 09/05/2019 12:38    MDM: Patient CT with no signs of kidney stone.  Patient updated on 12 mm mass that has been present for at least 2 years, patient unaware of this finding but instructed to follow-up with PCP.  Doubt this is causing patient's pain or urinary symptoms.  Patient questions about swelling in his testicles and penis, likely dependent edema based on patient's sedentary lifestyle.  Recommend close follow-up with PCP for further evaluation.  As patient still has symptoms antibiotics were changed to Bactrim.  Key discharge instructions: Your CT showed no signs of stone but there is a 32mm right renal mass that has been there for several please follow-up with your primary care physician for further evaluation.  Start taking Bactrim, discontinue your current antibiotic.  You likely have dependent edema in your scrotal region, recommend close follow-up with PCP for further evaluation.        Janeece Fitting,  MD 09/06/19 1223    Raeford Razor, MD 09/06/19 1444

## 2019-09-05 NOTE — Discharge Instructions (Addendum)
Your CT showed no signs of stone but there is a 55mm right renal mass that has been there for several please follow-up with your primary care physician for further evaluation.  Start taking Bactrim, discontinue your current antibiotic.

## 2019-09-05 NOTE — ED Notes (Signed)
Urine culture sent with urine sample. 

## 2019-09-05 NOTE — ED Triage Notes (Signed)
Patient complains of dysuria and right flank pain for several days. Taking antibiotics with no relief. Patient states that he hs urgency with same and testicle swelling

## 2019-09-08 LAB — URINE CULTURE: Culture: >=100000 — AB

## 2019-09-09 ENCOUNTER — Telehealth: Payer: Self-pay

## 2019-09-09 NOTE — Progress Notes (Signed)
ED Antimicrobial Stewardship Positive Culture Follow Up   Jorge [name] is an 74 y.o. male who presented to [facility] on 09/05/2019 with a chief complaint of urinary tract infection.   Chief Complaint  Patient presents with  . Urinary Tract Infection    Recent Results (from the past 720 hour(s))  Urine culture     Status: Abnormal   Collection Time: 09/05/19  1:43 PM   Specimen: Urine, Random  Result Value Ref Range Status   Specimen Description URINE, RANDOM  Final   Special Requests   Final    NONE Performed at [facility], [address], [place], [place] [address]    Culture (A)  Final    >=100,000 COLONIES/mL ESCHERICHIA COLI Confirmed Extended Spectrum Beta-Lactamase Producer (ESBL).  In bloodstream infections from ESBL organisms, carbapenems are preferred over piperacillin/tazobactam. They are shown to have a lower risk of mortality.    Report Status 09/08/2019 FINAL  Final   Organism ID, Bacteria ESCHERICHIA COLI (A)  Final      Susceptibility   Escherichia coli - MIC*    AMPICILLIN >=32 RESISTANT Resistant     CEFAZOLIN >=64 RESISTANT Resistant     CEFTRIAXONE >=64 RESISTANT Resistant     CIPROFLOXACIN >=4 RESISTANT Resistant     GENTAMICIN <=1 SENSITIVE Sensitive     IMIPENEM <=0.25 SENSITIVE Sensitive     NITROFURANTOIN <=16 SENSITIVE Sensitive     TRIMETH/SULFA >=320 RESISTANT Resistant     AMPICILLIN/SULBACTAM 4 SENSITIVE Sensitive     PIP/TAZO <=4 SENSITIVE Sensitive     * >=100,000 COLONIES/mL ESCHERICHIA COLI    [x]  Treated with Bactrim, organism resistant to prescribed antimicrobial  New antibiotic prescription: Augmentin 875 mg twice daily for 10 days.   ED Provider: , MD   Meridee Score, PharmD PGY1 Ambulatory Care Resident Monday - Friday phone -  [phone] Saturday - Sunday phone - [phone]

## 2019-09-09 NOTE — Telephone Encounter (Signed)
Recommendation per Dr Charm Barges to stop Jorge [name] and start Augmnetin for UC ED 09/05/19  PEr PT he has seen PCP and started on Amoxicillcin for this UC at [facility]

## 2020-03-27 ENCOUNTER — Telehealth: Payer: Self-pay

## 2020-04-23 ENCOUNTER — Encounter (HOSPITAL_COMMUNITY): Payer: Self-pay

## 2020-04-23 ENCOUNTER — Inpatient Hospital Stay (HOSPITAL_COMMUNITY)
Admission: EM | Admit: 2020-04-23 | Discharge: 2020-04-26 | DRG: 252 | Disposition: A | Discharge status: Home Health | Payer: Medicare Other | Attending: Internal Medicine | Admitting: Internal Medicine

## 2020-04-23 ENCOUNTER — Emergency Department (HOSPITAL_COMMUNITY): Payer: Medicare Other

## 2020-04-23 ENCOUNTER — Observation Stay (HOSPITAL_COMMUNITY): Payer: Medicare Other

## 2020-04-23 DIAGNOSIS — F32A Depression, unspecified: Secondary | ICD-10-CM | POA: Diagnosis present

## 2020-04-23 DIAGNOSIS — R4781 Slurred speech: Secondary | ICD-10-CM | POA: Diagnosis present

## 2020-04-23 DIAGNOSIS — Y929 Unspecified place or not applicable: Secondary | ICD-10-CM

## 2020-04-23 DIAGNOSIS — T82855A Stenosis of coronary artery stent, initial encounter: Secondary | ICD-10-CM | POA: Diagnosis not present

## 2020-04-23 DIAGNOSIS — I69351 Hemiplegia and hemiparesis following cerebral infarction affecting right dominant side: Secondary | ICD-10-CM

## 2020-04-23 DIAGNOSIS — I6523 Occlusion and stenosis of bilateral carotid arteries: Secondary | ICD-10-CM | POA: Diagnosis present

## 2020-04-23 DIAGNOSIS — F419 Anxiety disorder, unspecified: Secondary | ICD-10-CM | POA: Diagnosis present

## 2020-04-23 DIAGNOSIS — N1832 Chronic kidney disease, stage 3b: Secondary | ICD-10-CM | POA: Diagnosis present

## 2020-04-23 DIAGNOSIS — I6522 Occlusion and stenosis of left carotid artery: Secondary | ICD-10-CM | POA: Diagnosis present

## 2020-04-23 DIAGNOSIS — Z79890 Hormone replacement therapy (postmenopausal): Secondary | ICD-10-CM

## 2020-04-23 DIAGNOSIS — K219 Gastro-esophageal reflux disease without esophagitis: Secondary | ICD-10-CM | POA: Diagnosis present

## 2020-04-23 DIAGNOSIS — I739 Peripheral vascular disease, unspecified: Secondary | ICD-10-CM | POA: Diagnosis present

## 2020-04-23 DIAGNOSIS — I639 Cerebral infarction, unspecified: Secondary | ICD-10-CM | POA: Diagnosis present

## 2020-04-23 DIAGNOSIS — G459 Transient cerebral ischemic attack, unspecified: Secondary | ICD-10-CM | POA: Diagnosis not present

## 2020-04-23 DIAGNOSIS — Z8249 Family history of ischemic heart disease and other diseases of the circulatory system: Secondary | ICD-10-CM

## 2020-04-23 DIAGNOSIS — Z6841 Body Mass Index (BMI) 40.0 and over, adult: Secondary | ICD-10-CM

## 2020-04-23 DIAGNOSIS — R29810 Facial weakness: Secondary | ICD-10-CM | POA: Diagnosis present

## 2020-04-23 DIAGNOSIS — Z9889 Other specified postprocedural states: Secondary | ICD-10-CM | POA: Diagnosis not present

## 2020-04-23 DIAGNOSIS — E785 Hyperlipidemia, unspecified: Secondary | ICD-10-CM | POA: Diagnosis present

## 2020-04-23 DIAGNOSIS — I2510 Atherosclerotic heart disease of native coronary artery without angina pectoris: Secondary | ICD-10-CM | POA: Diagnosis present

## 2020-04-23 DIAGNOSIS — E875 Hyperkalemia: Secondary | ICD-10-CM | POA: Diagnosis present

## 2020-04-23 DIAGNOSIS — I10 Essential (primary) hypertension: Secondary | ICD-10-CM | POA: Diagnosis present

## 2020-04-23 DIAGNOSIS — J45909 Unspecified asthma, uncomplicated: Secondary | ICD-10-CM | POA: Diagnosis present

## 2020-04-23 DIAGNOSIS — E039 Hypothyroidism, unspecified: Secondary | ICD-10-CM | POA: Diagnosis present

## 2020-04-23 DIAGNOSIS — I480 Paroxysmal atrial fibrillation: Secondary | ICD-10-CM | POA: Diagnosis present

## 2020-04-23 DIAGNOSIS — R29704 NIHSS score 4: Secondary | ICD-10-CM | POA: Diagnosis present

## 2020-04-23 DIAGNOSIS — Z79899 Other long term (current) drug therapy: Secondary | ICD-10-CM

## 2020-04-23 DIAGNOSIS — Z952 Presence of prosthetic heart valve: Secondary | ICD-10-CM

## 2020-04-23 DIAGNOSIS — Z7982 Long term (current) use of aspirin: Secondary | ICD-10-CM

## 2020-04-23 DIAGNOSIS — Z66 Do not resuscitate: Secondary | ICD-10-CM | POA: Diagnosis present

## 2020-04-23 DIAGNOSIS — I130 Hypertensive heart and chronic kidney disease with heart failure and stage 1 through stage 4 chronic kidney disease, or unspecified chronic kidney disease: Secondary | ICD-10-CM | POA: Diagnosis present

## 2020-04-23 DIAGNOSIS — I5032 Chronic diastolic (congestive) heart failure: Secondary | ICD-10-CM

## 2020-04-23 DIAGNOSIS — Z823 Family history of stroke: Secondary | ICD-10-CM

## 2020-04-23 DIAGNOSIS — Y713 Surgical instruments, materials and cardiovascular devices (including sutures) associated with adverse incidents: Secondary | ICD-10-CM | POA: Diagnosis present

## 2020-04-23 DIAGNOSIS — Z87891 Personal history of nicotine dependence: Secondary | ICD-10-CM

## 2020-04-23 DIAGNOSIS — Z20822 Contact with and (suspected) exposure to covid-19: Secondary | ICD-10-CM | POA: Diagnosis present

## 2020-04-23 DIAGNOSIS — Z8679 Personal history of other diseases of the circulatory system: Secondary | ICD-10-CM

## 2020-04-23 LAB — CREATININE, SERUM
Creatinine, Ser: 1.67 mg/dL — ABNORMAL HIGH (ref 0.61–1.24)
GFR, Estimated: 43 mL/min — ABNORMAL LOW (ref >60)

## 2020-04-23 LAB — COMPREHENSIVE METABOLIC PANEL
ALT: 17 U/L (ref 0–44)
AST: 18 U/L (ref 15–41)
Albumin: 3.9 g/dL (ref 3.5–5.0)
Alkaline Phosphatase: 110 U/L (ref 38–126)
Anion gap: 10 (ref 5–15)
BUN: 20 mg/dL (ref 8–23)
CO2: 25 mmol/L (ref 22–32)
Calcium: 8.9 mg/dL (ref 8.9–10.3)
Chloride: 100 mmol/L (ref 98–111)
Creatinine, Ser: 1.86 mg/dL — ABNORMAL HIGH (ref 0.61–1.24)
GFR, Estimated: 38 mL/min — ABNORMAL LOW (ref >60)
Glucose, Bld: 137 mg/dL — ABNORMAL HIGH (ref 70–99)
Potassium: 5.1 mmol/L (ref 3.5–5.1)
Sodium: 135 mmol/L (ref 135–145)
Total Bilirubin: 1.1 mg/dL (ref 0.3–1.2)
Total Protein: 7.6 g/dL (ref 6.5–8.1)

## 2020-04-23 LAB — I-STAT CHEM 8, ED
BUN: 25 mg/dL — ABNORMAL HIGH (ref 8–23)
Calcium, Ion: 1.1 mmol/L — ABNORMAL LOW (ref 1.15–1.40)
Chloride: 101 mmol/L (ref 98–111)
Creatinine, Ser: 1.8 mg/dL — ABNORMAL HIGH (ref 0.61–1.24)
Glucose, Bld: 132 mg/dL — ABNORMAL HIGH (ref 70–99)
HCT: 49 % (ref 39.0–52.0)
Hemoglobin: 16.7 g/dL (ref 13.0–17.0)
Potassium: 5.2 mmol/L — ABNORMAL HIGH (ref 3.5–5.1)
Sodium: 136 mmol/L (ref 135–145)
TCO2: 29 mmol/L (ref 22–32)

## 2020-04-23 LAB — CBC
HCT: 50.3 % (ref 39.0–52.0)
Hemoglobin: 16.1 g/dL (ref 13.0–17.0)
MCH: 31.1 pg (ref 26.0–34.0)
MCHC: 32 g/dL (ref 30.0–36.0)
MCV: 97.1 fL (ref 80.0–100.0)
Platelets: 233 10*3/uL (ref 150–400)
RBC: 5.18 MIL/uL (ref 4.22–5.81)
RDW: 13.3 % (ref 11.5–15.5)
WBC: 8.2 10*3/uL (ref 4.0–10.5)
nRBC: 0 % (ref 0.0–0.2)

## 2020-04-23 LAB — DIFFERENTIAL
Abs Immature Granulocytes: 0.05 10*3/uL (ref 0.00–0.07)
Basophils Absolute: 0 10*3/uL (ref 0.0–0.1)
Basophils Relative: 0 %
Eosinophils Absolute: 0.1 10*3/uL (ref 0.0–0.5)
Eosinophils Relative: 2 %
Immature Granulocytes: 1 %
Lymphocytes Relative: 18 %
Lymphs Abs: 1.5 10*3/uL (ref 0.7–4.0)
Monocytes Absolute: 0.8 10*3/uL (ref 0.1–1.0)
Monocytes Relative: 10 %
Neutro Abs: 5.8 10*3/uL (ref 1.7–7.7)
Neutrophils Relative %: 69 %

## 2020-04-23 LAB — RESP PANEL BY RT-PCR (FLU A&B, COVID) ARPGX2
Influenza A by PCR: NEGATIVE
Influenza B by PCR: NEGATIVE
SARS Coronavirus 2 by RT PCR: NEGATIVE

## 2020-04-23 LAB — APTT: aPTT: 36 seconds (ref 24–36)

## 2020-04-23 LAB — PROTIME-INR
INR: 1.1 (ref 0.8–1.2)
Prothrombin Time: 13.4 seconds (ref 11.4–15.2)

## 2020-04-23 MED ORDER — METOPROLOL TARTRATE 25 MG PO TABS
25.0000 mg | ORAL_TABLET | Freq: Two times a day (BID) | ORAL | Status: DC
  Administered 2020-04-24 – 2020-04-26 (×4): 25 mg via ORAL
  Filled 2020-04-23 (×6): qty 1

## 2020-04-23 MED ORDER — ACETAMINOPHEN 325 MG PO TABS
650.0000 mg | ORAL_TABLET | ORAL | Status: DC | PRN
  Administered 2020-04-23 – 2020-04-24 (×2): 650 mg via ORAL
  Filled 2020-04-23 (×2): qty 2

## 2020-04-23 MED ORDER — NITROGLYCERIN 0.4 MG SL SUBL: 0.4000 mg | SUBLINGUAL_TABLET | SUBLINGUAL | Status: DC | PRN

## 2020-04-23 MED ORDER — IOHEXOL 350 MG/ML SOLN
50.0000 mL | Freq: Once | INTRAVENOUS | Status: AC | PRN
  Administered 2020-04-23: 50 mL via INTRAVENOUS

## 2020-04-23 MED ORDER — STROKE: EARLY STAGES OF RECOVERY BOOK
Freq: Once | Status: AC
  Filled 2020-04-23: qty 1

## 2020-04-23 MED ORDER — SENNOSIDES-DOCUSATE SODIUM 8.6-50 MG PO TABS: 1.0000 | ORAL_TABLET | Freq: Every evening | ORAL | Status: DC | PRN

## 2020-04-23 MED ORDER — SODIUM CHLORIDE 0.9% FLUSH
3.0000 mL | Freq: Once | INTRAVENOUS | Status: AC
  Administered 2020-04-23: 3 mL via INTRAVENOUS

## 2020-04-23 MED ORDER — OXYBUTYNIN CHLORIDE ER 10 MG PO TB24
10.0000 mg | ORAL_TABLET | Freq: Every day | ORAL | Status: DC
  Administered 2020-04-24 – 2020-04-26 (×3): 10 mg via ORAL
  Filled 2020-04-23: qty 1
  Filled 2020-04-23 (×2): qty 2

## 2020-04-23 MED ORDER — ASPIRIN EC 81 MG PO TBEC
81.0000 mg | DELAYED_RELEASE_TABLET | Freq: Every day | ORAL | Status: DC
  Administered 2020-04-23 – 2020-04-26 (×4): 81 mg via ORAL
  Filled 2020-04-23 (×4): qty 1

## 2020-04-23 MED ORDER — SODIUM ZIRCONIUM CYCLOSILICATE 10 G PO PACK
10.0000 g | PACK | Freq: Once | ORAL | Status: AC
  Administered 2020-04-23: 10 g via ORAL
  Filled 2020-04-23: qty 1

## 2020-04-23 MED ORDER — ATORVASTATIN CALCIUM 40 MG PO TABS
40.0000 mg | ORAL_TABLET | Freq: Every day | ORAL | Status: DC
  Administered 2020-04-23 – 2020-04-25 (×3): 40 mg via ORAL
  Filled 2020-04-23 (×3): qty 1

## 2020-04-23 MED ORDER — CLOPIDOGREL BISULFATE 75 MG PO TABS
75.0000 mg | ORAL_TABLET | Freq: Every day | ORAL | Status: DC
  Administered 2020-04-24 – 2020-04-26 (×3): 75 mg via ORAL
  Filled 2020-04-23 (×3): qty 1

## 2020-04-23 MED ORDER — ACETAMINOPHEN 650 MG RE SUPP: 650.0000 mg | RECTAL | Status: DC | PRN

## 2020-04-23 MED ORDER — SODIUM CHLORIDE 0.9 % IV SOLN: INTRAVENOUS | Status: DC

## 2020-04-23 MED ORDER — LEVOTHYROXINE SODIUM 75 MCG PO TABS: 75.0000 ug | ORAL_TABLET | ORAL | Status: DC

## 2020-04-23 MED ORDER — LEVOTHYROXINE SODIUM 50 MCG PO TABS
50.0000 ug | ORAL_TABLET | ORAL | Status: DC
  Administered 2020-04-24 – 2020-04-26 (×2): 50 ug via ORAL
  Filled 2020-04-23 (×2): qty 1

## 2020-04-23 MED ORDER — HEPARIN SODIUM (PORCINE) 5000 UNIT/ML IJ SOLN
5000.0000 [IU] | Freq: Three times a day (TID) | INTRAMUSCULAR | Status: DC
  Administered 2020-04-23 – 2020-04-24 (×2): 5000 [IU] via SUBCUTANEOUS
  Filled 2020-04-23 (×2): qty 1

## 2020-04-23 MED ORDER — GABAPENTIN 600 MG PO TABS
600.0000 mg | ORAL_TABLET | Freq: Two times a day (BID) | ORAL | Status: DC
  Administered 2020-04-23 – 2020-04-26 (×6): 600 mg via ORAL
  Filled 2020-04-23 (×6): qty 1

## 2020-04-23 MED ORDER — PANTOPRAZOLE SODIUM 40 MG PO TBEC
40.0000 mg | DELAYED_RELEASE_TABLET | Freq: Every day | ORAL | Status: DC
  Administered 2020-04-23 – 2020-04-26 (×4): 40 mg via ORAL
  Filled 2020-04-23 (×4): qty 1

## 2020-04-23 MED ORDER — CLOPIDOGREL BISULFATE 300 MG PO TABS
300.0000 mg | ORAL_TABLET | Freq: Once | ORAL | Status: AC
  Administered 2020-04-23: 300 mg via ORAL
  Filled 2020-04-23: qty 1

## 2020-04-23 MED ORDER — ACETAMINOPHEN 160 MG/5ML PO SOLN: 650.0000 mg | ORAL | Status: DC | PRN

## 2020-04-23 NOTE — Plan of Care (Signed)
  Problem: Education: Goal: Knowledge of disease or condition will improve Outcome: Progressing Goal: Knowledge of secondary prevention will improve Outcome: Progressing Goal: Knowledge of patient specific risk factors addressed and post discharge goals established will improve Outcome: Progressing Goal: Individualized Educational Video(s) Outcome: Progressing   Problem: Coping: Goal: Will verbalize positive feelings about self Outcome: Progressing Goal: Will identify appropriate support needs Outcome: Progressing   Problem: Health Behavior/Discharge Planning: Goal: Ability to manage health-related needs will improve Outcome: Progressing   Problem: Health Behavior/Discharge Planning: Goal: Ability to manage health-related needs will improve Outcome: Progressing   Problem: Self-Care: Goal: Ability to participate in self-care as condition permits will improve Outcome: Progressing Goal: Verbalization of feelings and concerns over difficulty with self-care will improve Outcome: Progressing Goal: Ability to communicate needs accurately will improve Outcome: Progressing   Problem: Nutrition: Goal: Risk of aspiration will decrease Outcome: Progressing Goal: Dietary intake will improve Outcome: Progressing   Problem: Education: Goal: Knowledge of General Education information will improve Description: Including pain rating scale, medication(s)/side effects and non-pharmacologic comfort measures Outcome: Progressing   Problem: Health Behavior/Discharge Planning: Goal: Ability to manage health-related needs will improve Outcome: Progressing   Problem: Health Behavior/Discharge Planning: Goal: Ability to manage health-related needs will improve Outcome: Progressing   Problem: Education: Goal: Knowledge of General Education information will improve Description: Including pain rating scale, medication(s)/side effects and non-pharmacologic comfort measures Outcome:  Progressing   Problem: Clinical Measurements: Goal: Ability to maintain clinical measurements within normal limits will improve Outcome: Progressing Goal: Will remain free from infection Outcome: Progressing

## 2020-04-23 NOTE — Progress Notes (Signed)
Patient arrived to 3W via stretcher to room 16.

## 2020-04-23 NOTE — ED Triage Notes (Signed)
Pt arrived via [place] for c/o right sided weakness, right sided facial droop and slurred speech. Pt's LKW 02/20/2021 at 2330. Pt A-fib on monitor. Pt A&Ox4.

## 2020-04-23 NOTE — Progress Notes (Signed)
HOSPITAL MEDICINE OVERNIGHT EVENT NOTE    Notified by nursing that patient is complaining of intermittent blurry vision and intermittent slurring of his speech.  This has been ongoing for several hours.  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  Patient is already on aspirin, Plavix and statin therapy.  Patient is already being followed by neurology and vascular surgery and is to undergo a repeat left TCAR procedure in the next few days.  I have advised nursing to continue with neurologic checks and continue to document progression of patient's symptoms.  If patient develops any further deficits I have asked nursing to notify me immediately.  Marinda Elk  MD [facility]
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
## 2020-04-23 NOTE — Consult Note (Signed)
Neurology Consultation Reason for Consult: Right sided weakness Referring Physician: Rubin Payor, N  CC: Right-sided weakness  History is obtained from: Patient  HPI: Jorge [name] is a 75 y.o. male who awoke this morning in his normal state of health.  Around 6:30 AM, he took his shoe while out for a walk at which point he noticed that he started having some trouble with his right side.  Over the course of the morning, he has progressively had increasing slurred speech. Due to these deficits, he presents via EMS who activated code stroke.  On evaluation in the emergency department, he still had right-sided weakness.  He was taken emergently for a CT/CT angio which demonstrated kinking of his stent, but no occlusion.  He was taken for MRI which was negative, and after the MRI was obtained, he was reevaluated with some improvement in his symptoms.  LKW: 6:30 AM tpa given?: no, outside of window  ROS: A 14 point ROS was performed and is negative except as noted in the HPI.   Past Medical History:  Diagnosis Date   Anxiety    situational -    Asthma due to seasonal allergies    Atrial fibrillation (HCC)    pt on Eliquis   Chest pain    CHF (congestive heart failure) (HCC)    Depression    situational   Dyspnea    when I walk a long distance   Dysrhythmia    Edema    GERD (gastroesophageal reflux disease)    ocassional   Heart murmur    HTN (hypertension)    Hyperlipidemia    Hypothyroidism    Neuropathy    Obesity    Peripheral vascular disease (HCC)    carotid    Renal insufficiency 12/04/2017   Sleep apnea    SOB (shortness of breath)    Stroke (HCC)    Right side weakness, using a cane    Family History  Problem Relation Age of Onset   Heart attack Mother    Stroke Father      Social History:  reports that he has quit smoking. He has never used smokeless tobacco. He reports that he does not drink alcohol and does not use  drugs.   Exam: Current vital signs:   Vital signs in last 24 hours: BP: ()/()  Arterial Line BP: ()/()    Physical Exam  Constitutional: Appears obese Psych: Affect appropriate to situation Eyes: No scleral injection HENT: No OP obstrucion MSK: no joint deformities.  Cardiovascular: Normal rate and regular rhythm.  Respiratory: Effort normal, non-labored breathing GI: Soft.  No distension. There is no tenderness.  Skin: WDI  Neuro: Mental Status: Patient is awake, alert, oriented to person, place, month, year, and situation. Patient is able to give a clear and coherent history. No signs of aphasia or neglect Cranial Nerves: II: Visual Fields are full. Pupils are equal, round, and reactive to light.   III,IV, VI: EOMI without ptosis or diploplia.  V: Facial sensation is mildly diminished on the right VII: Facial movement is weak on the right side, though interestingly he has no air leak when puffing out his cheeks VIII: hearing is intact to voice X: Uvula elevates symmetrically XI: Shoulder shrug is symmetric. XII: tongue is midline without atrophy or fasciculations.  Motor: Tone is normal. Bulk is normal. 5/5 strength was present on the left, he has 4/5 weakness of the right arm and leg Sensory: Sensation is diminished on the right Cerebellar:  He has some horizontal tremor bilaterally without past-pointing.    I have reviewed labs in epic and the results pertinent to this consultation are: Creatinine 1.8, appears to be at baseline  I have reviewed the images obtained: CT head-negative, MRI brain-negative  Impression: 75 year old male with a history of carotid stenosis who presents with transient right arm and leg weakness which is currently markedly improved.  He does have significant stenosis associated with the stent and the concern has been raised of possible transient positional hypoperfusion.  I would favor having this evaluated by vascular surgery to  originally placed the stent.  Recommendations: 1) A1c, lipid panel 2) echo 3) vascular surgery consult regarding his left carotid stent,?  Need for angiogram 4) continue antiplatelet therapy with aspirin for now, though if stent is not felt to be the culprit, then may need anticoagulation given his history of atrial fibrillation. 5) stroke team to follow   Ritta Slot, MD [facility] [phone]  If 7pm- 7am, please page neurology on call as listed in AMION.
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
## 2020-04-23 NOTE — ED Notes (Signed)
Report was given to 3W RN & transport request has been submitted.

## 2020-04-23 NOTE — Code Documentation (Signed)
Stroke Response Nurse Documentation Code Documentation  Jorge [name] is a 75 y.o. male arriving to [facility] ED via [facility] on 04/23/2020 with past medical hx of Afib not on anticoagulation. Code stroke was activated by EMS. Patient from home where he was LKW at 2330 when he went to bed. PT reports waking up this morning and having trouble putting his dog on the leash with his right hand.   Stroke team at the bedside on patient arrival. Labs drawn and patient cleared for CT by Dr. Rubin Payor. Patient to CT with team. NIHSS 4 , see documentation for details and code stroke times. Patient with right facial droop, right arm weakness, right leg weakness and right decreased sensation on exam. The following imaging was completed:  CT, CTA head and neck. Patient is not a candidate for tPA due to being outside the window. Care/Plan: q2 mNIHSS/VS. Bedside handoff with ED RN Idalia Needle.    Lucila Maine  Stroke Response RN

## 2020-04-24 ENCOUNTER — Observation Stay (HOSPITAL_COMMUNITY): Payer: Medicare Other

## 2020-04-24 DIAGNOSIS — E7849 Other hyperlipidemia: Secondary | ICD-10-CM | POA: Diagnosis not present

## 2020-04-24 DIAGNOSIS — I6523 Occlusion and stenosis of bilateral carotid arteries: Secondary | ICD-10-CM | POA: Diagnosis present

## 2020-04-24 DIAGNOSIS — Y713 Surgical instruments, materials and cardiovascular devices (including sutures) associated with adverse incidents: Secondary | ICD-10-CM | POA: Diagnosis present

## 2020-04-24 DIAGNOSIS — I6389 Other cerebral infarction: Secondary | ICD-10-CM

## 2020-04-24 DIAGNOSIS — Z952 Presence of prosthetic heart valve: Secondary | ICD-10-CM | POA: Diagnosis not present

## 2020-04-24 DIAGNOSIS — Z20822 Contact with and (suspected) exposure to covid-19: Secondary | ICD-10-CM | POA: Diagnosis present

## 2020-04-24 DIAGNOSIS — Z66 Do not resuscitate: Secondary | ICD-10-CM | POA: Diagnosis present

## 2020-04-24 DIAGNOSIS — Z9889 Other specified postprocedural states: Secondary | ICD-10-CM | POA: Diagnosis not present

## 2020-04-24 DIAGNOSIS — I10 Essential (primary) hypertension: Secondary | ICD-10-CM | POA: Diagnosis not present

## 2020-04-24 DIAGNOSIS — E875 Hyperkalemia: Secondary | ICD-10-CM | POA: Diagnosis present

## 2020-04-24 DIAGNOSIS — I69351 Hemiplegia and hemiparesis following cerebral infarction affecting right dominant side: Secondary | ICD-10-CM | POA: Diagnosis not present

## 2020-04-24 DIAGNOSIS — T82855A Stenosis of coronary artery stent, initial encounter: Secondary | ICD-10-CM | POA: Diagnosis present

## 2020-04-24 DIAGNOSIS — I639 Cerebral infarction, unspecified: Secondary | ICD-10-CM | POA: Diagnosis present

## 2020-04-24 DIAGNOSIS — G459 Transient cerebral ischemic attack, unspecified: Secondary | ICD-10-CM | POA: Diagnosis present

## 2020-04-24 DIAGNOSIS — I739 Peripheral vascular disease, unspecified: Secondary | ICD-10-CM | POA: Diagnosis present

## 2020-04-24 DIAGNOSIS — I480 Paroxysmal atrial fibrillation: Secondary | ICD-10-CM | POA: Diagnosis present

## 2020-04-24 DIAGNOSIS — K219 Gastro-esophageal reflux disease without esophagitis: Secondary | ICD-10-CM | POA: Diagnosis present

## 2020-04-24 DIAGNOSIS — I6522 Occlusion and stenosis of left carotid artery: Secondary | ICD-10-CM | POA: Diagnosis not present

## 2020-04-24 DIAGNOSIS — N1832 Chronic kidney disease, stage 3b: Secondary | ICD-10-CM | POA: Diagnosis present

## 2020-04-24 DIAGNOSIS — R29810 Facial weakness: Secondary | ICD-10-CM | POA: Diagnosis present

## 2020-04-24 DIAGNOSIS — R4781 Slurred speech: Secondary | ICD-10-CM | POA: Diagnosis present

## 2020-04-24 DIAGNOSIS — I5032 Chronic diastolic (congestive) heart failure: Secondary | ICD-10-CM | POA: Diagnosis present

## 2020-04-24 DIAGNOSIS — J45909 Unspecified asthma, uncomplicated: Secondary | ICD-10-CM | POA: Diagnosis present

## 2020-04-24 DIAGNOSIS — E785 Hyperlipidemia, unspecified: Secondary | ICD-10-CM | POA: Diagnosis present

## 2020-04-24 DIAGNOSIS — Z6841 Body Mass Index (BMI) 40.0 and over, adult: Secondary | ICD-10-CM | POA: Diagnosis not present

## 2020-04-24 DIAGNOSIS — I130 Hypertensive heart and chronic kidney disease with heart failure and stage 1 through stage 4 chronic kidney disease, or unspecified chronic kidney disease: Secondary | ICD-10-CM | POA: Diagnosis present

## 2020-04-24 DIAGNOSIS — Z7982 Long term (current) use of aspirin: Secondary | ICD-10-CM | POA: Diagnosis not present

## 2020-04-24 DIAGNOSIS — R29704 NIHSS score 4: Secondary | ICD-10-CM | POA: Diagnosis present

## 2020-04-24 DIAGNOSIS — I2510 Atherosclerotic heart disease of native coronary artery without angina pectoris: Secondary | ICD-10-CM | POA: Diagnosis present

## 2020-04-24 DIAGNOSIS — E039 Hypothyroidism, unspecified: Secondary | ICD-10-CM | POA: Diagnosis present

## 2020-04-24 DIAGNOSIS — Y929 Unspecified place or not applicable: Secondary | ICD-10-CM | POA: Diagnosis not present

## 2020-04-24 LAB — COMPREHENSIVE METABOLIC PANEL
ALT: 15 U/L (ref 0–44)
AST: 14 U/L — ABNORMAL LOW (ref 15–41)
Albumin: 3.3 g/dL — ABNORMAL LOW (ref 3.5–5.0)
Alkaline Phosphatase: 93 U/L (ref 38–126)
Anion gap: 10 (ref 5–15)
BUN: 17 mg/dL (ref 8–23)
CO2: 25 mmol/L (ref 22–32)
Calcium: 8.5 mg/dL — ABNORMAL LOW (ref 8.9–10.3)
Chloride: 105 mmol/L (ref 98–111)
Creatinine, Ser: 1.65 mg/dL — ABNORMAL HIGH (ref 0.61–1.24)
GFR, Estimated: 43 mL/min — ABNORMAL LOW (ref >60)
Glucose, Bld: 101 mg/dL — ABNORMAL HIGH (ref 70–99)
Potassium: 3.8 mmol/L (ref 3.5–5.1)
Sodium: 140 mmol/L (ref 135–145)
Total Bilirubin: 1.1 mg/dL (ref 0.3–1.2)
Total Protein: 6.5 g/dL (ref 6.5–8.1)

## 2020-04-24 LAB — LIPID PANEL
Cholesterol: 112 mg/dL (ref 0–200)
HDL: 30 mg/dL — ABNORMAL LOW (ref >40)
LDL Cholesterol: 62 mg/dL (ref 0–99)
Total CHOL/HDL Ratio: 3.7 RATIO
Triglycerides: 98 mg/dL (ref <150)
VLDL: 20 mg/dL (ref 0–40)

## 2020-04-24 LAB — CBC
HCT: 45.3 % (ref 39.0–52.0)
Hemoglobin: 15.2 g/dL (ref 13.0–17.0)
MCH: 31.6 pg (ref 26.0–34.0)
MCHC: 33.6 g/dL (ref 30.0–36.0)
MCV: 94.2 fL (ref 80.0–100.0)
Platelets: 208 10*3/uL (ref 150–400)
RBC: 4.81 MIL/uL (ref 4.22–5.81)
RDW: 13.5 % (ref 11.5–15.5)
WBC: 6.7 10*3/uL (ref 4.0–10.5)
nRBC: 0 % (ref 0.0–0.2)

## 2020-04-24 LAB — ECHOCARDIOGRAM COMPLETE
AR max vel: 1.36 cm2
AV Area VTI: 1.56 cm2
AV Area mean vel: 1.57 cm2
AV Mean grad: 13 mmHg
AV Peak grad: 24.6 mmHg
Ao pk vel: 2.48 m/s
Area-P 1/2: 2.62 cm2
Height: 72 in
S' Lateral: 2.7 cm
Weight: 4992 oz

## 2020-04-24 LAB — HEMOGLOBIN A1C
Hgb A1c MFr Bld: 5.8 % — ABNORMAL HIGH (ref 4.8–5.6)
Mean Plasma Glucose: 119.76 mg/dL

## 2020-04-24 LAB — HEPARIN LEVEL (UNFRACTIONATED): Heparin Unfractionated: 0.22 IU/mL — ABNORMAL LOW (ref 0.30–0.70)

## 2020-04-24 MED ORDER — SODIUM CHLORIDE 0.9 % IV BOLUS: 250.0000 mL | Freq: Once | INTRAVENOUS | Status: DC

## 2020-04-24 MED ORDER — HEPARIN BOLUS VIA INFUSION
3000.0000 [IU] | Freq: Once | INTRAVENOUS | Status: DC
  Filled 2020-04-24: qty 3000

## 2020-04-24 MED ORDER — HEPARIN (PORCINE) 25000 UT/250ML-% IV SOLN
1750.0000 [IU]/h | INTRAVENOUS | Status: DC
  Administered 2020-04-24: 1600 [IU]/h via INTRAVENOUS
  Administered 2020-04-25: 1750 [IU]/h via INTRAVENOUS
  Filled 2020-04-24 (×4): qty 250
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
## 2020-04-24 NOTE — Progress Notes (Signed)
ANTICOAGULATION CONSULT NOTE   Pharmacy Consult for heparin Indication: atrial fibrillation and TIA  No Known Allergies  Patient Measurements: Height: 6' (182.9 cm) Weight: (!) 141.5 kg (312 lb) IBW/kg (Calculated) : 77.6 Heparin Dosing Weight: 110.4 kg  Vital Signs: Temp: 98.1 F (36.7 C) (01/18 2343) Temp Source: Oral (01/18 2343) BP: 114/45 (01/18 2343) Pulse Rate: 58 (01/18 2343)  Labs: Recent Labs    04/23/20 1418 04/23/20 1423 04/23/20 1931 04/24/20 0458 04/24/20 2247  HGB 16.1 16.7  --  15.2  --   HCT 50.3 49.0  --  45.3  --   PLT 233  --   --  208  --   APTT 36  --   --   --   --   LABPROT 13.4  --   --   --   --   INR 1.1  --   --   --   --   HEPARINUNFRC  --   --   --   --  0.22*  CREATININE 1.86* 1.80* 1.67* 1.65*  --     Estimated Creatinine Clearance: 57.3 mL/min (A) (by C-G formula based on SCr of 1.65 mg/dL (H)).   Medical History: Past Medical History:  Diagnosis Date  . Anxiety    situational -   . Asthma due to seasonal allergies   . Atrial fibrillation (HCC)    pt on Eliquis  . Chest pain   . CHF (congestive heart failure) (HCC)   . Depression    situational  . Dyspnea    when I walk a long distance  . Dysrhythmia   . Edema   . GERD (gastroesophageal reflux disease)    ocassional  . Heart murmur   . HTN (hypertension)   . Hyperlipidemia   . Hypothyroidism   . Neuropathy   . Obesity   . Peripheral vascular disease (HCC)    carotid   . Renal insufficiency 12/04/2017  . Sleep apnea   . SOB (shortness of breath)   . Stroke [facility])    Right side weakness, using a cane    Medications:  Medications Prior to Admission  Medication Sig Dispense Refill Last Dose  . acetaminophen (TYLENOL) 650 MG CR tablet Take 1,300 mg by mouth every 8 (eight) hours as needed for pain.   04/18/2020  . aspirin EC 81 MG tablet Take 1 tablet (81 mg total) by mouth daily. 150 tablet 2 week ago  . atorvastatin (LIPITOR) 40 MG tablet Take 40 mg by mouth at  bedtime.   04/22/2020 at pm  . furosemide (LASIX) 20 MG tablet Take 20 mg by mouth daily.   04/23/2020 at am  . gabapentin (NEURONTIN) 600 MG tablet Take 600 mg by mouth in the morning and at bedtime.   04/23/2020 at am  . levothyroxine (SYNTHROID) 50 MCG tablet Take 50 mcg by mouth See admin instructions. Take one tablet (50 mcg) by mouth on Saturday, Sunday, Tuesday, Thursday nights (take 75 mcg on the other days)   04/22/2020 at pm  . levothyroxine (SYNTHROID) 75 MCG tablet Take 75 mcg by mouth See admin instructions. Take one tablet (75 mcg) by mouth Monday, Wednesday, Friday nights (take 50 mcg on the other days)   04/20/2020  . lisinopril (ZESTRIL) 10 MG tablet Take 10 mg by mouth daily.   04/23/2020 at am  . metoprolol tartrate (LOPRESSOR) 25 MG tablet Take 25 mg by mouth 2 (two) times daily.   04/23/2020 at 6:30am  . nitroGLYCERIN (  NITROSTAT) 0.4 MG SL tablet Place 1 tablet (0.4 mg total) under the tongue every 5 (five) minutes as needed for chest pain. 20 tablet 1 couple years ago  . omeprazole (PRILOSEC) 40 MG capsule Take 40 mg by mouth at bedtime.   04/22/2020 at pm  . oxybutynin (DITROPAN-XL) 10 MG 24 hr tablet Take 10 mg by mouth daily.   04/23/2020 at am  . polyethylene glycol powder (GLYCOLAX/MIRALAX) powder Take 17 g by mouth daily. (Patient taking differently: Take 17 g by mouth daily as needed (constipation).) 255 g 0 3 weeks ago    Assessment: Jorge [name] presented w/ TIA. No tPA d/t outside window. 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 Pt also has history of Afib, not currently on anticoagulation. Pharmacy consulted to start heparin drip.  PMH: Atrial fibrillation on warfarin until traumatic intracerebral hemorrhage. Was restarted on DOAC for 18 months until a year ago when son stopped all of patients meds, has since only been on asa. He does not remember having any bleeding complications on DOAC and is willing to restart.  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 In 2020, Dr. Roda Shutters recommended DOAC after carotid procedures done.  The patient has had 2 recurrent transient episodes of blurred vision and speech difficulties since hospital arrival.   Baseline aPTT/PT/INR/CBC all within normal limits. Last dose of subQ heparin this morning at 0534  1/18 PM update: Heparin level below goal No issues per RN  Goal of Therapy:  Heparin level 0.3-0.7 units/ml Monitor platelets by anticoagulation protocol: Yes   Plan:  Inc heparin to 1750 units/hr 0800 heparin level  Abran Duke, PharmD, BCPS Clinical Pharmacist Phone: [phone]

## 2020-04-24 NOTE — Progress Notes (Signed)
Echocardiogram 2D Echocardiogram has been performed.  Jorge [name] 04/24/2020, 4:51 PM
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
## 2020-04-24 NOTE — Progress Notes (Signed)
Echo attempted at 1:30, therapy in room. Will re-attempt as schedule allows.
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
## 2020-04-24 NOTE — Progress Notes (Addendum)
  Progress Note    04/24/2020 9:10 AM  Subjective:  Patient experienced an episode of slurred speech and vision changes lasting less than a minute last night. Back to baseline this morning.   Vitals:   04/24/20 0727 04/24/20 0733  BP: (!) 122/45 123/81  Pulse: 63   Resp: 18   Temp: 98.1 F (36.7 C)   SpO2: 96%    Physical Exam: Lungs:  Non labored Extremities:  R hand well perfused despite no palpable radial pulse; palpable L radial pulse Abdomen:  soft Neurologic: A&O  CBC    Component Value Date/Time   WBC 6.7 04/24/2020 0458   RBC 4.81 04/24/2020 0458   HGB 15.2 04/24/2020 0458   HGB 14.2 01/14/2017 0852   HCT 45.3 04/24/2020 0458   HCT 42.3 01/14/2017 0852   PLT 208 04/24/2020 0458   PLT 242 01/14/2017 0852   MCV 94.2 04/24/2020 0458   MCV 93 01/14/2017 0852   MCH 31.6 04/24/2020 0458   MCHC 33.6 04/24/2020 0458   RDW 13.5 04/24/2020 0458   RDW 13.6 01/14/2017 0852   LYMPHSABS 1.5 04/23/2020 1418   LYMPHSABS 1.2 01/14/2017 0852   MONOABS 0.8 04/23/2020 1418   EOSABS 0.1 04/23/2020 1418   EOSABS 0.2 01/14/2017 0852   BASOSABS 0.0 04/23/2020 1418   BASOSABS 0.0 01/14/2017 0852    BMET    Component Value Date/Time   NA 140 04/24/2020 0458   NA 142 01/14/2017 0852   K 3.8 04/24/2020 0458   CL 105 04/24/2020 0458   CO2 25 04/24/2020 0458   GLUCOSE 101 (H) 04/24/2020 0458   BUN 17 04/24/2020 0458   BUN 18 01/14/2017 0852   CREATININE 1.65 (H) 04/24/2020 0458   CALCIUM 8.5 (L) 04/24/2020 0458   GFRNONAA 43 (L) 04/24/2020 0458   GFRAA 43 (L) 09/05/2019 1031    INR    Component Value Date/Time   INR 1.1 04/23/2020 1418     Intake/Output Summary (Last 24 hours) at 04/24/2020 0910 Last data filed at 04/24/2020 0500 Gross per 24 hour  Intake 71.36 ml  Output 1250 ml  Net -1178.64 ml     Assessment/Plan:  75 y.o. male with symptomatic recurrent L ICA stenosis  Another episode of vision changes and slurred speech overnight; neuro exam back to  baseline this morning Plan is for redo L TCAR tomorrow 04/25/20 with [name] Continue aspirin, plavix, statin NPO past midnight Consent   Emilie Rutter, PA-C [facility] [phone] 04/24/2020 9:10 AM  Pt for redo left TCAR by Dr Chestine Spore tomorrow D/w pt he wishes to proceed. Continue plavix aspirin statin  Fabienne Bruns, MD [facility] [place] Office: [phone]

## 2020-04-24 NOTE — Progress Notes (Addendum)
ANTICOAGULATION CONSULT NOTE - Initial Consult  Pharmacy Consult for heparin Indication: atrial fibrillation and TIA  No Known Allergies  Patient Measurements: Height: 6' (182.9 cm) Weight: (!) 141.5 kg (312 lb) IBW/kg (Calculated) : 77.6 Heparin Dosing Weight: 110.4 kg  Vital Signs: Temp: 98.2 F (36.8 C) (01/18 1255) Temp Source: Oral (01/18 1255) BP: 108/64 (01/18 1300) Pulse Rate: 64 (01/18 1255)  Labs: Recent Labs    04/23/20 1418 04/23/20 1423 04/23/20 1931 04/24/20 0458  HGB 16.1 16.7  --  15.2  HCT 50.3 49.0  --  45.3  PLT 233  --   --  208  APTT 36  --   --   --   LABPROT 13.4  --   --   --   INR 1.1  --   --   --   CREATININE 1.86* 1.80* 1.67* 1.65*    Estimated Creatinine Clearance: 57.3 mL/min (A) (by C-G formula based on SCr of 1.65 mg/dL (H)).   Medical History: Past Medical History:  Diagnosis Date  . Anxiety    situational -   . Asthma due to seasonal allergies   . Atrial fibrillation (HCC)    pt on Eliquis  . Chest pain   . CHF (congestive heart failure) (HCC)   . Depression    situational  . Dyspnea    when I walk a long distance  . Dysrhythmia   . Edema   . GERD (gastroesophageal reflux disease)    ocassional  . Heart murmur   . HTN (hypertension)   . Hyperlipidemia   . Hypothyroidism   . Neuropathy   . Obesity   . Peripheral vascular disease (HCC)    carotid   . Renal insufficiency 12/04/2017  . Sleep apnea   . SOB (shortness of breath)   . Stroke [facility])    Right side weakness, using a cane    Medications:  Medications Prior to Admission  Medication Sig Dispense Refill Last Dose  . acetaminophen (TYLENOL) 650 MG CR tablet Take 1,300 mg by mouth every 8 (eight) hours as needed for pain.   04/18/2020  . aspirin EC 81 MG tablet Take 1 tablet (81 mg total) by mouth daily. 150 tablet 2 week ago  . atorvastatin (LIPITOR) 40 MG tablet Take 40 mg by mouth at bedtime.   04/22/2020 at pm  . furosemide (LASIX) 20 MG tablet Take 20 mg  by mouth daily.   04/23/2020 at am  . gabapentin (NEURONTIN) 600 MG tablet Take 600 mg by mouth in the morning and at bedtime.   04/23/2020 at am  . levothyroxine (SYNTHROID) 50 MCG tablet Take 50 mcg by mouth See admin instructions. Take one tablet (50 mcg) by mouth on Saturday, Sunday, Tuesday, Thursday nights (take 75 mcg on the other days)   04/22/2020 at pm  . levothyroxine (SYNTHROID) 75 MCG tablet Take 75 mcg by mouth See admin instructions. Take one tablet (75 mcg) by mouth Monday, Wednesday, Friday nights (take 50 mcg on the other days)   04/20/2020  . lisinopril (ZESTRIL) 10 MG tablet Take 10 mg by mouth daily.   04/23/2020 at am  . metoprolol tartrate (LOPRESSOR) 25 MG tablet Take 25 mg by mouth 2 (two) times daily.   04/23/2020 at 6:30am  . nitroGLYCERIN (NITROSTAT) 0.4 MG SL tablet Place 1 tablet (0.4 mg total) under the tongue every 5 (five) minutes as needed for chest pain. 20 tablet 1 couple years ago  . omeprazole (PRILOSEC) 40 MG capsule  Take 40 mg by mouth at bedtime.   04/22/2020 at pm  . oxybutynin (DITROPAN-XL) 10 MG 24 hr tablet Take 10 mg by mouth daily.   04/23/2020 at am  . polyethylene glycol powder (GLYCOLAX/MIRALAX) powder Take 17 g by mouth daily. (Patient taking differently: Take 17 g by mouth daily as needed (constipation).) 255 g 0 3 weeks ago    Assessment: Jorge [name] presented w/ TIA. No tPA d/t outside window. 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 Pt also has history of Afib, not currently on anticoagulation. Pharmacy consulted to start heparin drip.  PMH: Atrial fibrillation on warfarin until traumatic intracerebral hemorrhage. Was restarted on DOAC for 18 months until a year ago when son stopped all of patients meds, has since only been on asa. He does not remember having any bleeding complications on DOAC and is willing to restart.  L CEA 2004 following stroke, TAVR in 2018, and L TCAR in 2019 for recurrent stenosis.  In 2020, Dr. Roda Shutters recommended DOAC after carotid procedures done.  The patient has had 2 recurrent transient episodes of blurred vision and speech difficulties since hospital arrival.   Baseline aPTT/PT/INR/CBC all within normal limits. Last dose of subQ heparin this morning at 0534  Goal of Therapy:  Heparin level 0.3-0.7 units/ml Monitor platelets by anticoagulation protocol: Yes   Plan:  Give heparin 3000 unit bolus Start heparin infusion at 1600 units/hr Check anti-Xa level in 8 hours and daily while on heparin Continue to monitor H&H and platelets    Thank you for allowing [place] to participate in this patients care.   Signe Colt, PharmD Please see amion for complete clinical pharmacist phone list. 04/24/2020 1:41 PM

## 2020-04-25 ENCOUNTER — Encounter (HOSPITAL_COMMUNITY): Payer: Self-pay | Admitting: Internal Medicine

## 2020-04-25 ENCOUNTER — Inpatient Hospital Stay (HOSPITAL_COMMUNITY): Payer: Medicare Other

## 2020-04-25 ENCOUNTER — Inpatient Hospital Stay (HOSPITAL_COMMUNITY): Payer: Medicare Other | Admitting: Anesthesiology

## 2020-04-25 ENCOUNTER — Encounter (HOSPITAL_COMMUNITY)
Admission: EM | Disposition: A | Discharge status: Home Health | Payer: Self-pay | Source: Home / Self Care | Attending: Internal Medicine

## 2020-04-25 DIAGNOSIS — I5032 Chronic diastolic (congestive) heart failure: Secondary | ICD-10-CM

## 2020-04-25 DIAGNOSIS — N1832 Chronic kidney disease, stage 3b: Secondary | ICD-10-CM

## 2020-04-25 DIAGNOSIS — I10 Essential (primary) hypertension: Secondary | ICD-10-CM

## 2020-04-25 DIAGNOSIS — E7849 Other hyperlipidemia: Secondary | ICD-10-CM

## 2020-04-25 DIAGNOSIS — I6522 Occlusion and stenosis of left carotid artery: Secondary | ICD-10-CM

## 2020-04-25 DIAGNOSIS — I2510 Atherosclerotic heart disease of native coronary artery without angina pectoris: Secondary | ICD-10-CM

## 2020-04-25 DIAGNOSIS — Z8679 Personal history of other diseases of the circulatory system: Secondary | ICD-10-CM

## 2020-04-25 HISTORY — PX: ULTRASOUND GUIDANCE FOR VASCULAR ACCESS: SHX6516

## 2020-04-25 HISTORY — PX: TRANSCAROTID ARTERY REVASCULARIZATIONÂ: SHX6778

## 2020-04-25 LAB — CBC
HCT: 42.6 % (ref 39.0–52.0)
Hemoglobin: 13.7 g/dL (ref 13.0–17.0)
MCH: 30.7 pg (ref 26.0–34.0)
MCHC: 32.2 g/dL (ref 30.0–36.0)
MCV: 95.5 fL (ref 80.0–100.0)
Platelets: 197 10*3/uL (ref 150–400)
RBC: 4.46 MIL/uL (ref 4.22–5.81)
RDW: 13.4 % (ref 11.5–15.5)
WBC: 6.2 10*3/uL (ref 4.0–10.5)
nRBC: 0 % (ref 0.0–0.2)

## 2020-04-25 LAB — BASIC METABOLIC PANEL
Anion gap: 9 (ref 5–15)
BUN: 20 mg/dL (ref 8–23)
CO2: 25 mmol/L (ref 22–32)
Calcium: 8.3 mg/dL — ABNORMAL LOW (ref 8.9–10.3)
Chloride: 105 mmol/L (ref 98–111)
Creatinine, Ser: 1.74 mg/dL — ABNORMAL HIGH (ref 0.61–1.24)
GFR, Estimated: 41 mL/min — ABNORMAL LOW (ref >60)
Glucose, Bld: 116 mg/dL — ABNORMAL HIGH (ref 70–99)
Potassium: 4 mmol/L (ref 3.5–5.1)
Sodium: 139 mmol/L (ref 135–145)

## 2020-04-25 LAB — MRSA PCR SCREENING: MRSA by PCR: NEGATIVE

## 2020-04-25 LAB — HEPARIN LEVEL (UNFRACTIONATED): Heparin Unfractionated: 0.62 IU/mL (ref 0.30–0.70)

## 2020-04-25 SURGERY — TRANSCAROTID ARTERY REVASCULARIZATION (TCAR)
Anesthesia: General | Site: Neck | Laterality: Right

## 2020-04-25 MED ORDER — OXYCODONE HCL 5 MG/5ML PO SOLN
5.0000 mg | Freq: Once | ORAL | Status: DC | PRN
Start: 2020-04-25 — End: 2020-04-25

## 2020-04-25 MED ORDER — GLYCOPYRROLATE 0.2 MG/ML IJ SOLN
INTRAMUSCULAR | Status: DC | PRN
  Administered 2020-04-25: .2 mg via INTRAVENOUS

## 2020-04-25 MED ORDER — HEPARIN SODIUM (PORCINE) 1000 UNIT/ML IJ SOLN
INTRAMUSCULAR | Status: DC | PRN
  Administered 2020-04-25: 14000 [IU] via INTRAVENOUS

## 2020-04-25 MED ORDER — ONDANSETRON HCL 4 MG/2ML IJ SOLN: 4.0000 mg | Freq: Once | INTRAMUSCULAR | Status: DC | PRN

## 2020-04-25 MED ORDER — PHENYLEPHRINE HCL (PRESSORS) 10 MG/ML IV SOLN
INTRAVENOUS | Status: DC | PRN
  Administered 2020-04-25: 100 ug via INTRAVENOUS

## 2020-04-25 MED ORDER — PHENYLEPHRINE HCL-NACL 10-0.9 MG/250ML-% IV SOLN
INTRAVENOUS | Status: DC | PRN
  Administered 2020-04-25: 100 ug/min via INTRAVENOUS
  Administered 2020-04-25: 50 ug/min via INTRAVENOUS

## 2020-04-25 MED ORDER — SUGAMMADEX SODIUM 500 MG/5ML IV SOLN
INTRAVENOUS | Status: DC | PRN
  Administered 2020-04-25: 300 mg via INTRAVENOUS

## 2020-04-25 MED ORDER — DILTIAZEM HCL 25 MG/5ML IV SOLN
10.0000 mg | Freq: Once | INTRAVENOUS | Status: AC
  Administered 2020-04-25: 10 mg via INTRAVENOUS
  Filled 2020-04-25: qty 5

## 2020-04-25 MED ORDER — SUGAMMADEX SODIUM 500 MG/5ML IV SOLN
INTRAVENOUS | Status: AC
  Filled 2020-04-25: qty 5

## 2020-04-25 MED ORDER — OXYCODONE HCL 5 MG PO TABS: 5.0000 mg | ORAL_TABLET | Freq: Once | ORAL | Status: DC | PRN

## 2020-04-25 MED ORDER — DEXTROSE 5 % IV SOLN
INTRAVENOUS | Status: DC | PRN
  Administered 2020-04-25: 3 g via INTRAVENOUS

## 2020-04-25 MED ORDER — KETOROLAC TROMETHAMINE 0.5 % OP SOLN
OPHTHALMIC | Status: AC
  Filled 2020-04-25: qty 5

## 2020-04-25 MED ORDER — SODIUM CHLORIDE 0.9 % IV SOLN: 500.0000 mL | Freq: Once | INTRAVENOUS | Status: DC | PRN

## 2020-04-25 MED ORDER — PROTAMINE SULFATE 10 MG/ML IV SOLN
INTRAVENOUS | Status: DC | PRN
  Administered 2020-04-25 (×2): 20 mg via INTRAVENOUS
  Administered 2020-04-25: 10 mg via INTRAVENOUS

## 2020-04-25 MED ORDER — MIDAZOLAM HCL 5 MG/5ML IJ SOLN
INTRAMUSCULAR | Status: DC | PRN
  Administered 2020-04-25: 1 mg via INTRAVENOUS

## 2020-04-25 MED ORDER — METOPROLOL TARTRATE 5 MG/5ML IV SOLN
INTRAVENOUS | Status: AC
  Filled 2020-04-25: qty 5

## 2020-04-25 MED ORDER — HEMOSTATIC AGENTS (NO CHARGE) OPTIME
TOPICAL | Status: DC | PRN
  Administered 2020-04-25: 1 via TOPICAL

## 2020-04-25 MED ORDER — PROPOFOL 10 MG/ML IV BOLUS
INTRAVENOUS | Status: DC | PRN
  Administered 2020-04-25: 150 mg via INTRAVENOUS
  Administered 2020-04-25: 20 mg via INTRAVENOUS

## 2020-04-25 MED ORDER — ORAL CARE MOUTH RINSE: 15.0000 mL | Freq: Once | OROMUCOSAL | Status: AC

## 2020-04-25 MED ORDER — SODIUM CHLORIDE 0.9 % IV SOLN: INTRAVENOUS | Status: DC

## 2020-04-25 MED ORDER — DILTIAZEM LOAD VIA INFUSION: 10.0000 mg | Freq: Once | INTRAVENOUS | Status: DC

## 2020-04-25 MED ORDER — GUAIFENESIN-DM 100-10 MG/5ML PO SYRP: 15.0000 mL | ORAL_SOLUTION | ORAL | Status: DC | PRN

## 2020-04-25 MED ORDER — CHLORHEXIDINE GLUCONATE 0.12 % MT SOLN
OROMUCOSAL | Status: AC
  Filled 2020-04-25: qty 15

## 2020-04-25 MED ORDER — ROCURONIUM BROMIDE 10 MG/ML (PF) SYRINGE
PREFILLED_SYRINGE | INTRAVENOUS | Status: DC | PRN
  Administered 2020-04-25: 30 mg via INTRAVENOUS
  Administered 2020-04-25: 100 mg via INTRAVENOUS

## 2020-04-25 MED ORDER — ONDANSETRON HCL 4 MG/2ML IJ SOLN
INTRAMUSCULAR | Status: DC | PRN
  Administered 2020-04-25: 4 mg via INTRAVENOUS

## 2020-04-25 MED ORDER — LABETALOL HCL 5 MG/ML IV SOLN: 10.0000 mg | INTRAVENOUS | Status: DC | PRN

## 2020-04-25 MED ORDER — MIDAZOLAM HCL 2 MG/2ML IJ SOLN
INTRAMUSCULAR | Status: AC
  Filled 2020-04-25: qty 2

## 2020-04-25 MED ORDER — ONDANSETRON HCL 4 MG/2ML IJ SOLN: 4.0000 mg | Freq: Four times a day (QID) | INTRAMUSCULAR | Status: DC | PRN

## 2020-04-25 MED ORDER — BSS IO SOLN
15.0000 mL | Freq: Once | INTRAOCULAR | Status: DC
  Filled 2020-04-25 (×2): qty 15

## 2020-04-25 MED ORDER — SODIUM CHLORIDE 0.9 % IV SOLN
INTRAVENOUS | Status: DC | PRN
  Administered 2020-04-25: 500 mL

## 2020-04-25 MED ORDER — MORPHINE SULFATE (PF) 2 MG/ML IV SOLN: 2.0000 mg | INTRAVENOUS | Status: AC | PRN

## 2020-04-25 MED ORDER — FENTANYL CITRATE (PF) 250 MCG/5ML IJ SOLN
INTRAMUSCULAR | Status: AC
  Filled 2020-04-25: qty 5

## 2020-04-25 MED ORDER — CEFAZOLIN SODIUM-DEXTROSE 2-4 GM/100ML-% IV SOLN
INTRAVENOUS | Status: AC
  Administered 2020-04-25: 2000 mg
  Filled 2020-04-25: qty 100

## 2020-04-25 MED ORDER — FENTANYL CITRATE (PF) 100 MCG/2ML IJ SOLN
INTRAMUSCULAR | Status: AC
  Filled 2020-04-25: qty 2

## 2020-04-25 MED ORDER — HYDRALAZINE HCL 20 MG/ML IJ SOLN: 5.0000 mg | INTRAMUSCULAR | Status: DC | PRN

## 2020-04-25 MED ORDER — KETOROLAC TROMETHAMINE 0.5 % OP SOLN
1.0000 [drp] | Freq: Three times a day (TID) | OPHTHALMIC | Status: AC | PRN
  Filled 2020-04-25: qty 5

## 2020-04-25 MED ORDER — LACTATED RINGERS IV SOLN: INTRAVENOUS | Status: DC

## 2020-04-25 MED ORDER — ALBUTEROL SULFATE HFA 108 (90 BASE) MCG/ACT IN AERS
INHALATION_SPRAY | RESPIRATORY_TRACT | Status: DC | PRN
  Administered 2020-04-25: 3 via RESPIRATORY_TRACT

## 2020-04-25 MED ORDER — FENTANYL CITRATE (PF) 100 MCG/2ML IJ SOLN
25.0000 ug | INTRAMUSCULAR | Status: DC | PRN
  Administered 2020-04-25: 25 ug via INTRAVENOUS

## 2020-04-25 MED ORDER — LIDOCAINE HCL (CARDIAC) PF 100 MG/5ML IV SOSY
PREFILLED_SYRINGE | INTRAVENOUS | Status: DC | PRN
  Administered 2020-04-25: 30 mg via INTRAVENOUS

## 2020-04-25 MED ORDER — CHLORHEXIDINE GLUCONATE 0.12 % MT SOLN
15.0000 mL | Freq: Once | OROMUCOSAL | Status: AC
  Administered 2020-04-25: 15 mL via OROMUCOSAL

## 2020-04-25 MED ORDER — 0.9 % SODIUM CHLORIDE (POUR BTL) OPTIME
TOPICAL | Status: DC | PRN
  Administered 2020-04-25: 1000 mL

## 2020-04-25 MED ORDER — DEXAMETHASONE SODIUM PHOSPHATE 10 MG/ML IJ SOLN
INTRAMUSCULAR | Status: DC | PRN
  Administered 2020-04-25: 5 mg via INTRAVENOUS

## 2020-04-25 MED ORDER — FENTANYL CITRATE (PF) 100 MCG/2ML IJ SOLN
INTRAMUSCULAR | Status: DC | PRN
  Administered 2020-04-25: 100 ug via INTRAVENOUS
  Administered 2020-04-25: 50 ug via INTRAVENOUS

## 2020-04-25 MED ORDER — METOPROLOL TARTRATE 5 MG/5ML IV SOLN
2.0000 mg | INTRAVENOUS | Status: DC | PRN
Start: 2020-04-25 — End: 2020-04-26
  Administered 2020-04-25: 4 mg via INTRAVENOUS
  Filled 2020-04-25: qty 5

## 2020-04-25 MED ORDER — CEFAZOLIN SODIUM-DEXTROSE 2-4 GM/100ML-% IV SOLN
2.0000 g | Freq: Three times a day (TID) | INTRAVENOUS | Status: AC
Start: 2020-04-25 — End: 2020-04-26
  Administered 2020-04-26: 2 g via INTRAVENOUS
  Filled 2020-04-25 (×2): qty 100

## 2020-04-25 MED ORDER — LACTATED RINGERS IV SOLN: INTRAVENOUS | Status: DC | PRN

## 2020-04-25 MED ORDER — IODIXANOL 320 MG/ML IV SOLN
INTRAVENOUS | Status: DC | PRN
  Administered 2020-04-25: 40 mL via INTRA_ARTERIAL

## 2020-04-25 MED ORDER — OXYCODONE-ACETAMINOPHEN 5-325 MG PO TABS
1.0000 | ORAL_TABLET | ORAL | Status: DC | PRN
Start: 2020-04-25 — End: 2020-04-26

## 2020-04-25 MED ORDER — PHENOL 1.4 % MT LIQD: 1.0000 | OROMUCOSAL | Status: DC | PRN

## 2020-04-25 SURGICAL SUPPLY — 61 items
BAG BANDED W/RUBBER/TAPE 36X54 (MISCELLANEOUS) ×3 IMPLANT
BALLN STERLING RX 6X30X80 (BALLOONS) ×3
BALLOON STERLING RX 6X30X80 (BALLOONS) ×2 IMPLANT
CANISTER SUCT 3000ML PPV (MISCELLANEOUS) ×3 IMPLANT
CATH BEACON 5 .035 40 KMP TP (CATHETERS) ×2 IMPLANT
CATH BEACON 5 .038 40 KMP TP (CATHETERS) ×3
CATH QUICKCROSS SUPP .035X90CM (MICROCATHETER) ×3 IMPLANT
CATH ROBINSON RED A/P 18FR (CATHETERS) IMPLANT
CLIP VESOCCLUDE MED 6/CT (CLIP) ×3 IMPLANT
CLIP VESOCCLUDE SM WIDE 6/CT (CLIP) ×3 IMPLANT
COVER DOME SNAP 22 D (MISCELLANEOUS) ×3 IMPLANT
COVER PROBE W GEL 5X96 (DRAPES) ×3 IMPLANT
DERMABOND ADVANCED (GAUZE/BANDAGES/DRESSINGS) ×2
DERMABOND ADVANCED .7 DNX12 (GAUZE/BANDAGES/DRESSINGS) ×4 IMPLANT
DEVICE TORQUE KENDALL .025-038 (MISCELLANEOUS) ×3 IMPLANT
DRAPE FEMORAL ANGIO 80X135IN (DRAPES) ×3 IMPLANT
ELECT REM PT RETURN 9FT ADLT (ELECTROSURGICAL) ×3
ELECTRODE REM PT RTRN 9FT ADLT (ELECTROSURGICAL) ×2 IMPLANT
GLOVE BIO SURGEON STRL SZ7.5 (GLOVE) ×3 IMPLANT
GOWN STRL REUS W/ TWL LRG LVL3 (GOWN DISPOSABLE) ×4 IMPLANT
GOWN STRL REUS W/ TWL XL LVL3 (GOWN DISPOSABLE) ×2 IMPLANT
GOWN STRL REUS W/TWL LRG LVL3 (GOWN DISPOSABLE) ×6
GOWN STRL REUS W/TWL XL LVL3 (GOWN DISPOSABLE) ×3
GUIDEWIRE ANGLED .035X150CM (WIRE) ×3 IMPLANT
GUIDEWIRE ENROUTE 0.014 (WIRE) ×9 IMPLANT
HEMOSTAT SNOW SURGICEL 2X4 (HEMOSTASIS) ×3 IMPLANT
INTRODUCER KIT GALT 7CM (INTRODUCER) ×3
KIT BASIN OR (CUSTOM PROCEDURE TRAY) ×3 IMPLANT
KIT ENCORE 26 ADVANTAGE (KITS) ×3 IMPLANT
KIT INTRODUCER GALT 7 (INTRODUCER) ×2 IMPLANT
KIT TURNOVER KIT B (KITS) ×3 IMPLANT
NEEDLE HYPO 25GX1X1/2 BEV (NEEDLE) IMPLANT
PACK CAROTID (CUSTOM PROCEDURE TRAY) ×3 IMPLANT
POSITIONER HEAD DONUT 9IN (MISCELLANEOUS) ×3 IMPLANT
PROTECTION STATION PRESSURIZED (MISCELLANEOUS) ×3
SET MICROPUNCTURE 5F STIFF (MISCELLANEOUS) ×3 IMPLANT
SHEATH PINNACLE 5FR (SHEATH) IMPLANT
SHUNT CAROTID BYPASS 10 (VASCULAR PRODUCTS) IMPLANT
SHUNT CAROTID BYPASS 12FRX15.5 (VASCULAR PRODUCTS) IMPLANT
STATION PROTECTION PRESSURIZED (MISCELLANEOUS) ×2 IMPLANT
STENT TRANSCAROTID SYS 10X30 (Permanent Stent) ×3 IMPLANT
SUT MNCRL AB 4-0 PS2 18 (SUTURE) ×3 IMPLANT
SUT PROLENE 5 0 C 1 24 (SUTURE) ×6 IMPLANT
SUT PROLENE 6 0 BV (SUTURE) IMPLANT
SUT PROLENE 7 0 BV 1 (SUTURE) IMPLANT
SUT SILK 2 0 PERMA HAND 18 BK (SUTURE) IMPLANT
SUT SILK 2 0 SH CR/8 (SUTURE) ×3 IMPLANT
SUT SILK 3 0 (SUTURE)
SUT SILK 3-0 18XBRD TIE 12 (SUTURE) IMPLANT
SUT VIC AB 3-0 SH 27 (SUTURE) ×3
SUT VIC AB 3-0 SH 27X BRD (SUTURE) ×2 IMPLANT
SYR 10ML LL (SYRINGE) ×9 IMPLANT
SYR 20ML LL LF (SYRINGE) ×3 IMPLANT
SYR CONTROL 10ML LL (SYRINGE) IMPLANT
TOWEL GREEN STERILE (TOWEL DISPOSABLE) ×3 IMPLANT
WATER STERILE IRR 1000ML POUR (IV SOLUTION) ×3 IMPLANT
WIRE BENTSON .035X145CM (WIRE) ×3 IMPLANT
WIRE EMERALD 3MM-J .035X150CM (WIRE) ×3 IMPLANT
WIRE G V18X300CM (WIRE) ×3 IMPLANT
WIRE SPARTACORE .014X190CM (WIRE) ×3 IMPLANT
WIRE TORQFLEX AUST .018X40CM (WIRE) ×3 IMPLANT
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
## 2020-04-25 NOTE — Op Note (Signed)
Date: April 25, 2020  Preoperative diagnosis: Left brain TIA with suspected high-grade symptomatic stenosis in distal left carotid stent after previous TCAR  Postoperative diagnosis: Same  Procedure: 1.  Ultrasound-guided access of right common femoral vein for delivery of TCAR flow reversal sheath 2.  Redo exposure of left common carotid artery 3.  Redo transcatheter placement of left carotid stent including angioplasty with reverse flow embolic neuro protection (redo left TCAR with 10 mm x 30 mm Enroute stent)  Surgeon: Dr. Cephus Shelling, MD  Assistant: Aggie Moats, PA  Indications: Patient is a 75 year old male who previously had a left carotid endarterectomy and also a previous left TCAR.  He was seen earlier this week with a likely TIA and left brain event thought to be from restenosis of his carotid stent previously placed in 2019 during TCAR.  He presents today after risk benefits discussed.  An assistant was needed for exposure and to expedite the case.  Findings: Transverse incision over the left clavicle and ultimately cutdown and re-do exposure of the left common carotid artery.  After percutaneous access he had what look like a high-grade recurrent stenosis with thrombus in the distal carotid stent.  Ultimately after flow reversal was established we had significant difficulty crossing the stent distally without crossing a stent strut.  Finally after multiple attempts with different wires and catheters we were finally able to cross the stent distally in the true lumen of the ICA and ultimately this lesion was predilated with a 6 mm x 30 mm angioplasty balloon and stented with a 10 mm x 30 mm Enroute stent that was widely patent with no residual stenosis at completion and good filling distally in the ICA with no overt dissection.  Flow reversal time was 37 minutes  Anesthesia: General  Details: Patient was taken to the operating room after informed consent was obtained.   Placed on the operative table in supine position.  General endotracheal anesthesia was induced.  I did use ultrasound to mark the left common carotid artery just above the clavicle.  Ultimately the right groin as well as the left neck were prepped and draped in sterile fashion.  A timeout was performed to identify patient procedure and site.  Ultimately we initially started with a sterile ultrasound probe and evaluated the right common femoral vein which was patent and image was saved.  Under ultrasound guidance this was accessed with a micro access needle placed a microwire and a micro sheath.  I then advanced a Bentson wire and then the TCAR flow reversal venous sheath in the right groin was placed with good venous return. I then turned my attention to the left neck and a transverse incision was made one fingerbreadth above the left clavicle.  Dissected down through the platysma and ultimately found a plane between the heads of the sternocleidomastoid to where we could then dissect down and bluntly were able to identify the internal jugular vein and common carotid artery.  Internal jugular vein was dissected laterally and we visualized the vagus nerve posteriorly surrounded by scar tissue that was preserved.  Ultimately I was able to get a vessel loop for proximal vascular control and an umbilical tape around the proximal carotid artery.  Patient was given 100 units/kg heparin IV and ACT was checked to maintain greater than 250.  I then placed a 5-0 Prolene U stitch in the anterior wall of the common carotid artery.  This was then accessed with micro access needle, placed a microwire  and then a micro sheath.  We then obtained carotid angiogram in multiple views to mark the bifurcation as well as the distal extent of his existing carotid stent where there appeared to be both thrombus and residual stenosis.  I then placed a J-wire and the micro sheath was exchanged for the TCAR sheath into the common carotid  artery.  This was secured with multiple 3-0 silk ties.  I then connected the filter and this was connected to the venous sheath in the groin with good return.  We then performed a TCAR timeout and went on active clamp of the common carotid artery with good flow reversal.  We then initially used a KMP catheter with an 014 wire to try and cross the lesion in the distal carotid stent.  Ultimately I could not get my wire to cross and we had to make multiple wire exchanges for other V 14 and V 18 wires.  Again I could not get the wire to cross the distal stent, so I tried using both the J-wire and a Glidewire unsuccessful.  We then elected to downsize to a smaller system and I used an 014 quick cross and ultimately I thought I finally got my wire to cross the lesion up into the ICA distally.  Unfortunately I could not get the quick cross to advance over the wire past the distal stent suggesting that we were through a strut.  We made multiple additional attempts and every time we would get a wire across distally we could not get the quick cross to advance again suggesting more likely the wire was through a strut although dissection was certainly a possibility.  Finally after a multitude of attempts while being on flow reversal we found what we thought was a different plane across the distal extent of the carotid stent up into the ICA.  The quick cross easily crossed with no resistance.  I then confirmed with a hand-injection of the catheter in the distal ICA that was in the true lumen distally.  We predilated the lesion with a 6 mm x 30 mm balloon and then primarily placed a 10 mm x 30 mm Enroute stent.  Total flow reversal time was 37 minutes.  I was very satisfied with the stent with inline flow no dissection and good filling distally in the intracranial ICA.  That point time we removed our wire and delivery systems.  The sheath was removed from the common carotid artery tying down her U stitch.  I did have to place an  additional 5-0 Prolene.  Ultimately umbilical tape and Vesseloops were removed.  We had excellent Doppler flow in the common carotid and protamine was given.  The sheath in the right groin was removed and manual pressure was held for 8 minutes.  I irrigated out the neck incision until the effluent was all clear.  Surgicel snow was placed.  The platsyma was closed with running 3-0 Vicryl and skni closed with 4-0 Monocryl Dermabond  Complication: None  Condition: Stable  Cephus Shelling, MD [facility] Office: [phone]   Cephus Shelling

## 2020-04-25 NOTE — Anesthesia Procedure Notes (Signed)
Arterial Line Insertion Start/End1/19/2022 12:54 PM, 04/25/2020 12:58 PM Performed by: Beryle Lathe, MD, anesthesiologist  Patient location: Pre-op. Preanesthetic checklist: patient identified, IV checked, risks and benefits discussed, surgical consent, monitors and equipment checked, pre-op evaluation, timeout performed and anesthesia consent Lidocaine 1% used for infiltration and patient sedated Right, radial was placed Catheter size: 20 G Hand hygiene performed   Attempts: 2 (Previous attempts by CRNA unsuccessful) Procedure performed using ultrasound guided technique. Ultrasound Notes:anatomy identified, needle tip was noted to be adjacent to the nerve/plexus identified, no ultrasound evidence of intravascular and/or intraneural injection and image(s) printed for medical record Following insertion, dressing applied and Biopatch. Post procedure assessment: unchanged and normal  Patient tolerated the procedure well with no immediate complications.

## 2020-04-25 NOTE — Anesthesia Procedure Notes (Signed)
Procedure Name: Intubation Date/Time: 04/25/2020 1:25 PM Performed by: Gwenyth Allegra, CRNA Pre-anesthesia Checklist: Patient identified, Emergency Drugs available, Suction available, Patient being monitored and Timeout performed Patient Re-evaluated:Patient Re-evaluated prior to induction Oxygen Delivery Method: Circle system utilized Preoxygenation: Pre-oxygenation with 100% oxygen Induction Type: IV induction Ventilation: Mask ventilation without difficulty and Oral airway inserted - appropriate to patient size Laryngoscope Size: Glidescope Grade View: Grade I Tube type: Oral Tube size: 7.5 mm Number of attempts: 1 Airway Equipment and Method: Stylet and Video-laryngoscopy Secured at: 23 cm Tube secured with: Tape Dental Injury: Teeth and Oropharynx as per pre-operative assessment

## 2020-04-25 NOTE — Transfer of Care (Signed)
Immediate Anesthesia Transfer of Care Note  Patient: Jorge [name]  Procedure(s) Performed: Zada Finders ARTERY REVASCULARIZATION (Left Neck) ULTRASOUND GUIDANCE FOR VASCULAR ACCESS (Right Groin)  Patient Location: PACU  Anesthesia Type:General  Level of Consciousness: awake, alert  and oriented  Airway & Oxygen Therapy: Patient Spontanous Breathing, Patient connected to nasal cannula oxygen and Patient connected to face mask oxygen  Post-op Assessment: Report given to RN, Post -op Vital signs reviewed and stable and Patient moving all extremities X 4  Post vital signs: Reviewed and stable  Last Vitals:  Vitals Value Taken Time  BP 162/80 04/25/20 1547  Temp    Pulse 33 04/25/20 1549  Resp 14 04/25/20 1544  SpO2 79 % 04/25/20 1549  Vitals shown include unvalidated device data.  Last Pain:  Vitals:   04/25/20 1222  TempSrc:   PainSc: 3       Patients Stated Pain Goal: 1 (04/25/20 1222)  Complications: No complications documented.

## 2020-04-25 NOTE — Progress Notes (Addendum)
[facility]  Subjective  - no additional neurologic events overnight.   Objective (!) 134/58 62 (!) 97.4 F (36.3 C) (Oral) 19 97%  Intake/Output Summary (Last 24 hours) at 04/25/2020 1216 Last data filed at 04/25/2020 0800 Gross per 24 hour  Intake 240 ml  Output 700 ml  Net -460 ml    CN II-XII grossly intact No appreciable deficits on exam  Laboratory Lab Results: Recent Labs    04/24/20 0458 04/25/20 0916  WBC 6.7 6.2  HGB 15.2 13.7  HCT 45.3 42.6  PLT 208 197   BMET Recent Labs    04/24/20 0458 04/25/20 0916  NA 140 139  K 3.8 4.0  CL 105 105  CO2 25 25  GLUCOSE 101* 116*  BUN 17 20  CREATININE 1.65* 1.74*  CALCIUM 8.5* 8.3*    COAG Lab Results  Component Value Date   INR 1.1 04/23/2020   INR 1.14 03/06/2018   INR 1.11 03/06/2018   No results found for: PTT  Assessment/Planning:  Plan re-do left TCAR for high grade stenosis in distal portion of previous carotid stent with suspected symptomatic lesion.  Discussed risks and benefits with him.  Discussed my concern for CN injury and intra-operative stroke among other risks.    Jorge [name] 04/25/2020 12:16 PM --

## 2020-04-25 NOTE — Anesthesia Preprocedure Evaluation (Addendum)
Anesthesia Evaluation  Patient identified by MRN, date of birth, ID band Patient awake    Reviewed: Allergy & Precautions, NPO status , Patient's Chart, lab work & pertinent test results  History of Anesthesia Complications Negative for: history of anesthetic complications  Airway Mallampati: III  TM Distance: >3 FB Neck ROM: Full    Dental  (+) Dental Advisory Given   Pulmonary asthma , sleep apnea , former smoker,    Pulmonary exam normal        Cardiovascular hypertension, Pt. on medications and Pt. 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 Mild LVH. Grade II diastolic dysfunction (pseudonormalization). LA was mildly dilated.  There is a Sapien prosthetic (TAVR) valve present in the aortic position. No AI/AS.    Neuro/Psych PSYCHIATRIC DISORDERS Anxiety Depression CVA, Residual Symptoms    GI/Hepatic Neg liver ROS, GERD  Medicated and Controlled,  Endo/Other  Hypothyroidism Morbid obesity  Renal/GU CRFRenal disease     Musculoskeletal negative musculoskeletal ROS (+)   Abdominal   Peds  Hematology negative hematology ROS (+)   Anesthesia Other Findings Covid test negative   Reproductive/Obstetrics                            Anesthesia Physical Anesthesia Plan  ASA: III  Anesthesia Plan: General   Post-op Pain Management:    Induction: Intravenous  PONV Risk Score and Plan: 2 and Treatment may vary due to age or medical condition and Ondansetron  Airway Management Planned: Oral ETT  Additional Equipment: Arterial line  Intra-op Plan:   Post-operative Plan: Extubation in OR  Informed Consent: I have reviewed the patients History and Physical, chart, labs and discussed the procedure including the risks, benefits and alternatives for  the proposed anesthesia with the patient or authorized representative who has indicated his/her understanding and acceptance.   Patient has DNR.   Dental advisory given  Plan Discussed with: CRNA and Anesthesiologist  Anesthesia Plan Comments:        Anesthesia Quick Evaluation

## 2020-04-25 NOTE — Anesthesia Postprocedure Evaluation (Signed)
Anesthesia Post Note  Patient: Jorge [name]  Procedure(s) Performed: Zada Finders ARTERY REVASCULARIZATION (Left Neck) ULTRASOUND GUIDANCE FOR VASCULAR ACCESS (Right Groin)     Patient location during evaluation: PACU Anesthesia Type: General Level of consciousness: awake and alert Pain management: pain level controlled Vital Signs Assessment: post-procedure vital signs reviewed and stable Respiratory status: spontaneous breathing, nonlabored ventilation, respiratory function stable and patient connected to nasal cannula oxygen Cardiovascular status: blood pressure returned to baseline and stable Postop Assessment: no apparent nausea or vomiting Anesthetic complications: yes (Right eye corneal abrasion)   No complications documented.  Last Vitals:  Vitals:   04/25/20 1630 04/25/20 1645  BP: (!) 153/74 (!) 153/72  Pulse: 68 77  Resp: (!) 22 (!) 22  Temp:  (!) 36.1 C  SpO2: 100% 95%    Last Pain:  Vitals:   04/25/20 1645  TempSrc:   PainSc: 2                  Beryle Lathe

## 2020-04-26 ENCOUNTER — Encounter (HOSPITAL_COMMUNITY): Payer: Self-pay | Admitting: Vascular Surgery

## 2020-04-26 DIAGNOSIS — I639 Cerebral infarction, unspecified: Secondary | ICD-10-CM

## 2020-04-26 LAB — BASIC METABOLIC PANEL
Anion gap: 9 (ref 5–15)
BUN: 17 mg/dL (ref 8–23)
CO2: 24 mmol/L (ref 22–32)
Calcium: 8.3 mg/dL — ABNORMAL LOW (ref 8.9–10.3)
Chloride: 105 mmol/L (ref 98–111)
Creatinine, Ser: 1.79 mg/dL — ABNORMAL HIGH (ref 0.61–1.24)
GFR, Estimated: 39 mL/min — ABNORMAL LOW (ref >60)
Glucose, Bld: 159 mg/dL — ABNORMAL HIGH (ref 70–99)
Potassium: 4.6 mmol/L (ref 3.5–5.1)
Sodium: 138 mmol/L (ref 135–145)

## 2020-04-26 LAB — CBC
HCT: 41.1 % (ref 39.0–52.0)
Hemoglobin: 13.9 g/dL (ref 13.0–17.0)
MCH: 32 pg (ref 26.0–34.0)
MCHC: 33.8 g/dL (ref 30.0–36.0)
MCV: 94.5 fL (ref 80.0–100.0)
Platelets: 213 10*3/uL (ref 150–400)
RBC: 4.35 MIL/uL (ref 4.22–5.81)
RDW: 13.3 % (ref 11.5–15.5)
WBC: 10.8 10*3/uL — ABNORMAL HIGH (ref 4.0–10.5)
nRBC: 0 % (ref 0.0–0.2)

## 2020-04-26 LAB — POCT ACTIVATED CLOTTING TIME: Activated Clotting Time: 315 seconds

## 2020-04-26 LAB — MAGNESIUM: Magnesium: 2.3 mg/dL (ref 1.7–2.4)

## 2020-04-26 MED ORDER — APIXABAN 5 MG PO TABS: 5.0000 mg | ORAL_TABLET | Freq: Two times a day (BID) | ORAL | 3 refills | Status: AC

## 2020-04-26 MED ORDER — ASPIRIN EC 81 MG PO TBEC: 81.0000 mg | DELAYED_RELEASE_TABLET | Freq: Every day | ORAL | 2 refills | Status: DC

## 2020-04-26 MED ORDER — APIXABAN 5 MG PO TABS
5.0000 mg | ORAL_TABLET | Freq: Two times a day (BID) | ORAL | Status: DC
  Administered 2020-04-26: 5 mg via ORAL

## 2020-04-26 MED ORDER — CLOPIDOGREL BISULFATE 75 MG PO TABS: 75.0000 mg | ORAL_TABLET | Freq: Every day | ORAL | 0 refills | Status: AC

## 2020-04-26 NOTE — Progress Notes (Signed)
ANTICOAGULATION CONSULT NOTE - Initial Consult  Pharmacy Consult for Apixaban Indication: atrial fibrillation  No Known Allergies  Patient Measurements: Height: 6' (182.9 cm) Weight: (!) 141.5 kg (312 lb) IBW/kg (Calculated) : 77.6  Vital Signs: Temp: 99 F (37.2 C) (01/20 1134) Temp Source: Oral (01/20 1134) BP: 138/60 (01/20 1134) Pulse Rate: 66 (01/20 1134)  Labs: Recent Labs    04/23/20 1418 04/23/20 1423 04/24/20 0458 04/24/20 2247 04/25/20 0916 04/26/20 0105  HGB 16.1   < > 15.2  --  13.7 13.9  HCT 50.3   < > 45.3  --  42.6 41.1  PLT 233  --  208  --  197 213  APTT 36  --   --   --   --   --   LABPROT 13.4  --   --   --   --   --   INR 1.1  --   --   --   --   --   HEPARINUNFRC  --   --   --  0.22* 0.62  --   CREATININE 1.86*   < > 1.65*  --  1.74* 1.79*   < > = values in this interval not displayed.    Estimated Creatinine Clearance: 52.8 mL/min (A) (by C-G formula based on SCr of 1.79 mg/dL (H)).   Medical History: Past Medical History:  Diagnosis Date  . Anxiety    situational -   . Asthma due to seasonal allergies   . Atrial fibrillation (HCC)    pt on Eliquis  . Chest pain   . CHF (congestive heart failure) (HCC)   . Depression    situational  . Dyspnea    when I walk a long distance  . Dysrhythmia   . Edema   . GERD (gastroesophageal reflux disease)    ocassional  . Heart murmur   . HTN (hypertension)   . Hyperlipidemia   . Hypothyroidism   . Neuropathy   . Obesity   . Peripheral vascular disease (HCC)    carotid   . Renal insufficiency 12/04/2017  . Sleep apnea   . SOB (shortness of breath)   . Stroke [facility])    Right side weakness, using a cane    Assessment: 75 years of age male with history of atrial fibrillation (not on anticoagulation prior to admission- see PMH notes below) and TIA who was initially treated with IV Heparin. IV Heparin was stopped for re-do left TCAR due to high grade stenosis performed on 04/25/2020. Pharmacy  consulted post-operatively day #1 to start Apixaban therapy. CBC has remained stable. SCr up at 1.79 - will need to monitor. Patient is also on ASA and Plavix. Neurology notes state to continue ASA + Apixaban + Plavix x1 month then ASA + Apixaban only. No overt bleeding noted.   PMH: Atrial fibrillation on warfarin until traumatic intracerebral hemorrhage. Was restarted on DOAC for 18 months until a year ago when son stopped all of patients meds, has since only been on asa. He does not remember having any bleeding complications on DOAC and is willing to restart.    Goal of Therapy:  Monitor platelets by anticoagulation protocol: Yes   Plan:  Continue Apixaban 5 mg po BID order placed by Dr. Isidoro Donning Monitor CBC, renal function, liver function, and clinical status.    Neurology notes state to continue ASA + Apixaban + Plavix x1 month then ASA + Apixaban only.   Link Snuffer, PharmD, BCPS, BCCCP Clinical Pharmacist  Please refer to [facility] for [facility] Pharmacy numbers 04/26/2020,1:35 PM

## 2020-04-26 NOTE — Progress Notes (Signed)
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 bilateral maxillary sinuses. Other: None. ASPECTS [facility] Stroke Program Early CT Score) - Ganglionic level infarction (caudate, lentiform nuclei, internal capsule, insula, M1-M3 cortex): 7 - Supraganglionic infarction (M4-M6 cortex): 3 Total score (0-10 with 10 being normal): 10 IMPRESSION: 1. No acute intracranial findings. 2. ASPECTS is 10. These results were called by telephone at the time of interpretation on 04/23/2020 at 2:30 pm to Dr. Kemper Durie via [facility] paging system. Electronically Signed   By: [name] Lenis M.D.   On: 04/23/2020 14:33   Structural Heart Procedure  Result Date: 04/25/2020 See surgical note for result.  HYBRID OR IMAGING ([facility] ONLY)  Result Date: 04/25/2020 There is no interpretation for this exam.  This order is for images obtained during a surgical procedure.  Please See "Surgeries" Tab for more information regarding the  procedure.    Lab Data:  CBC: Recent Labs  Lab 04/23/20 1418 04/23/20 1423 04/24/20 0458 04/25/20 0916 04/26/20 0105  WBC 8.2  --  6.7 6.2 10.8*  NEUTROABS 5.8  --   --   --   --   HGB 16.1 16.7 15.2 13.7 13.9  HCT 50.3 49.0 45.3 42.6 41.1  MCV 97.1  --  94.2 95.5 94.5  PLT 233  --  208 197 213   Basic Metabolic Panel: Recent Labs  Lab 04/23/20 1418 04/23/20 1423 04/23/20 1931 04/24/20 0458 04/25/20 0916 04/26/20 0105  NA 135 136  --  140 139 138  K 5.1 5.2*  --  3.8 4.0 4.6  CL 100 101  --  105 105 105  CO2 25  --   --  25 25 24   GLUCOSE 137* 132*  --  101* 116* 159*  BUN 20 25*  --  17 20 17   CREATININE 1.86* 1.80* 1.67* 1.65* 1.74* 1.79*  CALCIUM 8.9  --   --  8.5* 8.3* 8.3*  MG  --   --   --   --   --  2.3   GFR: Estimated Creatinine Clearance: 52.8 mL/min (A) (by C-G formula based on SCr of 1.79 mg/dL (H)). Liver Function Tests: Recent Labs  Lab 04/23/20 1418 04/24/20 0458  AST 18 14*  ALT 17 15  ALKPHOS 110 93  BILITOT 1.1 1.1  PROT 7.6 6.5  ALBUMIN 3.9 3.3*   No results for input(s): LIPASE, AMYLASE in the last 168 hours. No results for input(s): AMMONIA in the last 168 hours. Coagulation Profile: Recent Labs  Lab 04/23/20 1418  INR 1.1   Cardiac Enzymes: No results for input(s): CKTOTAL, CKMB, CKMBINDEX, TROPONINI in the last 168 hours. BNP (last 3 results) No results for input(s): PROBNP  in the last 8760 hours. HbA1C: Recent Labs    04/24/20 0458  HGBA1C 5.8*   CBG: No results for input(s): GLUCAP in the last 168 hours. Lipid Profile: Recent Labs    04/24/20 0458  CHOL 112  HDL 30*  LDLCALC 62  TRIG 98  CHOLHDL 3.7   Thyroid Function Tests: No results for input(s): TSH, T4TOTAL, FREET4, T3FREE, THYROIDAB in the last 72 hours. Anemia Panel: No results for input(s): VITAMINB12, FOLATE, FERRITIN, TIBC, IRON, RETICCTPCT in the last 72 hours. Urine analysis:    Component Value Date/Time   COLORURINE YELLOW 09/05/2019 1343    APPEARANCEUR CLEAR 09/05/2019 1343   LABSPEC 1.006 09/05/2019 1343   PHURINE 5.0 09/05/2019 1343   GLUCOSEU NEGATIVE 09/05/2019 1343   HGBUR MODERATE (A) 09/05/2019 1343   BILIRUBINUR NEGATIVE 09/05/2019 1343   KETONESUR NEGATIVE 09/05/2019 1343   PROTEINUR NEGATIVE 09/05/2019 1343   NITRITE NEGATIVE 09/05/2019 1343   LEUKOCYTESUR LARGE (A) 09/05/2019 1343     Ripudeep Rai M.D. [facility] 04/26/2020, 2:13 PM   Call night coverage person covering after 7pm

## 2020-04-26 NOTE — Discharge Instructions (Signed)
[facility]  Discharge Instructions   Carotid Surgery  Please refer to the following instructions for your post-procedure care. Your surgeon or physician assistant will discuss any changes with you.  Activity  You are encouraged to walk as much as you can. You can slowly return to normal activities but must avoid strenuous activity and heavy lifting until your doctor tell you it's okay. Avoid activities such as vacuuming or swinging a golf club. You can drive after one week if you are comfortable and you are no longer taking prescription pain medications. It is normal to feel tired for serval weeks after your surgery. It is also normal to have difficulty with sleep habits, eating, and bowel movements after surgery. These will go away with time.  Bathing/Showering  Shower daily after you go home. Do not soak in a bathtub, hot tub, or swim until the incision heals completely.  Incision Care  Shower every day. Clean your incision with mild soap and water. Pat the area dry with a clean towel. You do not need a bandage unless otherwise instructed. Do not apply any ointments or creams to your incision. You may have skin glue on your incision. Do not peel it off. It will come off on its own in about one week. Your incision may feel thickened and raised for several weeks after your surgery. This is normal and the skin will soften over time.   For Men Only: It's okay to shave around the incision but do not shave the incision itself for 2 weeks. It is common to have numbness under your chin that could last for several months.  Diet  Resume your normal diet. There are no special food restrictions following this procedure. A low fat/low cholesterol diet is recommended for all patients with vascular disease. In order to heal from your surgery, it is CRITICAL to get adequate nutrition. Your body requires vitamins, minerals, and protein. Vegetables are the best source of  vitamins and minerals. Vegetables also provide the perfect balance of protein. Processed food has little nutritional value, so try to avoid this.  Medications  Resume taking all of your medications unless your doctor or physician assistant tells you not to. If your incision is causing pain, you may take over-the- counter pain relievers such as acetaminophen (Tylenol). If you were prescribed a stronger pain medication, please be aware these medications can cause nausea and constipation. Prevent nausea by taking the medication with a snack or meal. Avoid constipation by drinking plenty of fluids and eating foods with a high amount of fiber, such as fruits, vegetables, and grains.  Do not take Tylenol if you are taking prescription pain medications.  Follow Up  Our office will schedule a follow up appointment 2-3 weeks following discharge.  Please call us immediately for any of the following conditions  . Increased pain, redness, drainage (pus) from your incision site. . Fever of 101 degrees or higher. . If you should develop stroke (slurred speech, difficulty swallowing, weakness on one side of your body, loss of vision) you should call 911 and go to the nearest emergency room. .  Reduce your risk of vascular disease:  . Stop smoking. If you would like help call [facility] at 1-800-QUIT-NOW (564-514-6300) or [place] at [phone]. . Manage your cholesterol . Maintain a desired weight . Control your diabetes . Keep your blood pressure down .  If you have any questions, please call the office at [phone].   Information  on my medicine - ELIQUIS (apixaban)  This medication education was reviewed with me or my healthcare representative as part of my discharge preparation.  The pharmacist that spoke with me during my hospital stay was:  Fayne Norrie, [facility]  Why was Eliquis prescribed for you? Eliquis was prescribed for you to reduce the risk of a blood clot forming that  can cause a stroke if you have a medical condition called atrial fibrillation (a type of irregular heartbeat).  What do You need to know about Eliquis ? Take your Eliquis TWICE DAILY - one tablet in the morning and one tablet in the evening with or without food. If you have difficulty swallowing the tablet whole please discuss with your pharmacist how to take the medication safely.  Take Eliquis exactly as prescribed by your doctor and DO NOT stop taking Eliquis without talking to the doctor who prescribed the medication.  Stopping may increase your risk of developing a stroke.  Refill your prescription before you run out.  After discharge, you should have regular check-up appointments with your healthcare provider that is prescribing your Eliquis.  In the future your dose may need to be changed if your kidney function or weight changes by a significant amount or as you get older.  What do you do if you miss a dose? If you miss a dose, take it as soon as you remember on the same day and resume taking twice daily.  Do not take more than one dose of ELIQUIS at the same time to make up a missed dose.  Important Safety Information A possible side effect of Eliquis is bleeding. You should call your healthcare provider right away if you experience any of the following: ? Bleeding from an injury or your nose that does not stop. ? Unusual colored urine (red or dark brown) or unusual colored stools (red or black). ? Unusual bruising for unknown reasons. ? A serious fall or if you hit your head (even if there is no bleeding).  Some medicines may interact with Eliquis and might increase your risk of bleeding or clotting while on Eliquis. To help avoid this, consult your healthcare provider or pharmacist prior to using any new prescription or non-prescription medications, including herbals, vitamins, non-steroidal anti-inflammatory drugs (NSAIDs) and supplements.  This website has more information  on Eliquis (apixaban): http://[email or web address].com/eliquis/home

## 2020-04-26 NOTE — Discharge Summary (Signed)
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 during a surgical procedure.  Please See "Surgeries" Tab for more information regarding the procedure.      LAB RESULTS: Basic Metabolic Panel: Recent Labs  Lab 04/25/20 0916 04/26/20 0105  NA 139 138  K 4.0 4.6  CL 105 105  CO2 25 24  GLUCOSE 116* 159*  BUN 20 17  CREATININE 1.74* 1.79*  CALCIUM 8.3* 8.3*  MG  --  2.3   Liver Function Tests: Recent Labs  Lab 04/23/20 1418 04/24/20 0458  AST 18 14*  ALT 17 15  ALKPHOS 110 93  BILITOT 1.1 1.1  PROT 7.6 6.5  ALBUMIN 3.9 3.3*   No results for input(s): LIPASE, AMYLASE in the last 168 hours. No results for input(s): AMMONIA in the last 168 hours. CBC: Recent Labs  Lab 04/23/20 1418 04/23/20 1423 04/25/20 0916 04/26/20 0105  WBC 8.2   < > 6.2 10.8*  NEUTROABS 5.8  --   --   --   HGB 16.1   < > 13.7 13.9  HCT 50.3   < > 42.6 41.1  MCV 97.1   < > 95.5 94.5  PLT 233   < > 197 213   < > = values in this interval not displayed.   Cardiac Enzymes: No results for input(s): CKTOTAL, CKMB, CKMBINDEX, TROPONINI in the last 168 hours. BNP: Invalid input(s): POCBNP CBG: No results for input(s): GLUCAP in the last 168 hours.     Disposition and  Follow-up: Discharge Instructions    Diet - low sodium heart healthy   Complete by: As directed    Discharge instructions   Complete by: As directed    Please continue aspirin 81 mg daily, Plavix 75 mg daily, Eliquis (blood thinner) 5 mg twice a day for 30 days.  Then continue only aspirin and Eliquis thereafter.   If the dressing is still on your incision site when you go home, remove it on the third day after your surgery date. Remove dressing if it begins to fall off, or if it is dirty or damaged before the third day.   Complete by: As directed    Increase activity slowly   Complete by: As directed        DISPOSITION: home with HHPT, OT   DISCHARGE FOLLOW-UP  Follow-up Information    Cephus Shelling, MD In 4 weeks.   Specialty: Vascular Surgery Why: Office will call you to arrange your appt (sent) Contact information: [address] [place] [address] [phone]        [name], PA-C. Schedule an appointment as soon as possible for a visit in 2 week(s).   Specialty: Physician Assistant Contact information: [address] [place] [address] [phone]        Wendall Stade, MD .   Specialty: Cardiology Contact information: [address] [place] [address] [phone]        Micki Riley, MD Follow up in 4 week(s).   Specialties: Neurology, Radiology Contact information: [address] [place] [address] [phone]                Time coordinating discharge:    Signed:   Thad Ranger M.D. [facility] 04/26/2020, 2:50 PM

## 2020-04-26 NOTE — TOC Transition Note (Signed)
Transition of Care (TOC) - CM/SW Discharge Note Jorge Pierini RN, BSN Transitions of Care Unit 4E- RN Case Manager See Treatment Team for direct phone #    Patient Details  Name: Jorge [name] MRN: [id number] Date of Birth: 05-Jul-1945  Transition of Care [facility]) CM/SW Contact:  Jorge Span, RN Phone Number: 04/26/2020, 4:06 PM   Clinical Narrative:    Pt stable for transition home today s/p TACR, orders for HHRN/PT/OT have been placed. CM spoke with pt at bedside- pt reporting that he can not return to his home until he can drive- needs someone to call his son Jorge [name] who is his POA to confirm plans to where he is going to go stay. Patient believes he is going to go stay with ex-wife- Jorge [name]. Pt also concerned about having a cane which he states is still at his home.  Call made to son- Jorge [name]- who confirmed that arrangements have been made for pt to stay with his ex-wife and son Jorge [name] who also lives there. Family is prepared for pt to discharge today and they have gone and gotten some clothes and his cane from his home. Also per Jorge [name]- his brother Jorge [name] will come transport him home when he is ready- Jorge [name] will plan to call Jorge [name] once staff call to let him know everything is ready and pt is ready to be picked up- Jorge [name] is about 15-20 min away.  Choice offered for [facility] needs to Jorge [name]- (and to pt at bedside)- Per CMS guidelines from medicare.gov website with star ratings (copy placed in shadow chart)- per Jorge [name] he does not have a preference for [facility] agency- patient states he just would like one with 4 stars if possible.   Jorge [name] confirmed address for [place] as  [address] [place] [address] Phone: [phone]- (may also call Jorge [name]- [phone])  This writer let patient know that I have spoken with Jorge [name] and that everything is in place, his family has taken care of everything. Pt seems relieved, and agreeable now to transition home with family. Bedside RN updated and will contact Jorge [name] when  she has patient ready to be picked up for transport home. Have also updated MD that pt can transition home today.   Call made to Jorge [name] with [facility] for HHRN/PT/OT referral- pending return call to confirm they can accept.    Final next level of care: Home w Home Health Services Barriers to Discharge: No Barriers Identified   Patient Goals and CMS Choice Patient states their goals for this hospitalization and ongoing recovery are:: going home with family- want to be able to drive again CMS Medicare.gov Compare [name] Acute Care list provided to:: Patient Choice offered to / list presented to : Patient,Adult Children  Discharge Placement               Home with [facility]        Discharge Plan and Services   Discharge Planning Services: CM Consult [name] Acute Care Choice: Home Health          DME Arranged: N/A DME Agency: NA       HH Arranged: RN,PT,OT   Date HH Agency Contacted: 04/26/20 Time HH Agency Contacted: 1524    Social Determinants of Health (SDOH) Interventions     Readmission Risk Interventions Readmission Risk Prevention Plan 04/26/2020  [name] Dischage Appt Complete  Medication Screening Complete  Transportation Screening Complete  Some recent data might be hidden
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
## 2020-04-26 NOTE — Progress Notes (Signed)
Pt went intoi a-fib and RVR at beginning of shift. Pt. Has history of chronic a fib. 4mg  of PRN Metoprolol given with no effect. HR remained in 110-130s. MD notified. Order received for 10mg  of IV cardizem. Medication administered with effective results. Modified stoke scale completed x2. No s/s of stroke. Pt states he can feel the strength coming back in right hand/arm. Grips are strong and equal. HR currently 56. , RN

## 2020-04-26 NOTE — Progress Notes (Signed)
Discharge printed instructions including follow up appointments and medication details given and explained to patient by CN Dahlia Client with emphasis on signs and symptoms of stroke and infection at the surgical site. Patient verbalized and demonstrated understanding. Transferred out via wheelchair. Alert, oriented, and conversant at baseline. Son Kai Levins provided ride home. Mikal Plane, BSN, RN

## 2020-04-26 NOTE — Progress Notes (Addendum)
  Progress Note    04/26/2020 7:52 AM 1 Day Post-Op  Subjective:  Says he feels like he is getting stronger and he is not dropping things with his right hand.   No trouble swallowing.  Says he has some weakness in the right leg from previous stroke.   Afebrile HR 50's-130's afib/NSR 130's systolic 96% RA  Vitals:   04/26/20 0600 04/26/20 0615  BP:    Pulse:  67  Resp: 14   Temp:  98.3 F (36.8 C)  SpO2:  97%     Physical Exam: Neuro:  Bilateral hand grips are equal; 4/5 RLE 5/5 LLE; tongue is midline Lungs:  Non labored Incision:  Left neck incision is clean and dry with mild ecchymosis.  Right groin is soft without hematoma.   CBC    Component Value Date/Time   WBC 10.8 (H) 04/26/2020 0105   RBC 4.35 04/26/2020 0105   HGB 13.9 04/26/2020 0105   HGB 14.2 01/14/2017 0852   HCT 41.1 04/26/2020 0105   HCT 42.3 01/14/2017 0852   PLT 213 04/26/2020 0105   PLT 242 01/14/2017 0852   MCV 94.5 04/26/2020 0105   MCV 93 01/14/2017 0852   MCH 32.0 04/26/2020 0105   MCHC 33.8 04/26/2020 0105   RDW 13.3 04/26/2020 0105   RDW 13.6 01/14/2017 0852   LYMPHSABS 1.5 04/23/2020 1418   LYMPHSABS 1.2 01/14/2017 0852   MONOABS 0.8 04/23/2020 1418   EOSABS 0.1 04/23/2020 1418   EOSABS 0.2 01/14/2017 0852   BASOSABS 0.0 04/23/2020 1418   BASOSABS 0.0 01/14/2017 0852    BMET    Component Value Date/Time   NA 138 04/26/2020 0105   NA 142 01/14/2017 0852   K 4.6 04/26/2020 0105   CL 105 04/26/2020 0105   CO2 24 04/26/2020 0105   GLUCOSE 159 (H) 04/26/2020 0105   BUN 17 04/26/2020 0105   BUN 18 01/14/2017 0852   CREATININE 1.79 (H) 04/26/2020 0105   CALCIUM 8.3 (L) 04/26/2020 0105   GFRNONAA 39 (L) 04/26/2020 0105   GFRAA 43 (L) 09/05/2019 1031     Intake/Output Summary (Last 24 hours) at 04/26/2020 0752 Last data filed at 04/26/2020 0320 Gross per 24 hour  Intake 5340.08 ml  Output 1100 ml  Net 4240.08 ml     Assessment/Plan:  This is a 75 y.o. male who is s/p redo  left TCAR 1 Day Post-Op  -pt is doing well this am. -pt neuro exam with equal bilateral hand grips.  4/5 RLE 5/5 LLE; tongue is midline -pt with hx of afib-went into afib last evening and converted back to NSR with a dose of IV Cardizem.  -continue Plavix/asa -f/u with [name] in 4 weeks with carotid duplex.  Our office will arrange this appt.    Doreatha Massed, PA-C [facility] [phone]  Agree with above no hematoma left neck overall feels well.  He should be stable for discharge from our standpoint today.  Fabienne Bruns, MD [facility] [place] Office: [phone]
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
## 2020-04-27 ENCOUNTER — Telehealth: Payer: Self-pay | Admitting: *Deleted

## 2020-04-27 NOTE — Telephone Encounter (Signed)
Spoke with liz from [facility]. Pt had questions about which blood thinners to take and how long. Discussed pt with Dr. Darrick Penna. Pt is to continue taking aspirin, plavix and eliquis until follow up appt with [name]. Marisue Ivan will communicate this to the pt.

## 2020-05-02 ENCOUNTER — Other Ambulatory Visit: Payer: Self-pay

## 2020-05-02 ENCOUNTER — Ambulatory Visit (HOSPITAL_COMMUNITY)
Admission: RE | Admit: 2020-05-02 | Discharge: 2020-05-02 | Disposition: A | Discharge status: Home / Self Care | Payer: Medicare Other | Source: Ambulatory Visit | Attending: Cardiovascular Disease | Admitting: Cardiovascular Disease

## 2020-05-02 ENCOUNTER — Other Ambulatory Visit (HOSPITAL_COMMUNITY): Payer: Self-pay | Admitting: Cardiovascular Disease

## 2020-05-02 DIAGNOSIS — G458 Other transient cerebral ischemic attacks and related syndromes: Secondary | ICD-10-CM

## 2020-05-02 DIAGNOSIS — I6521 Occlusion and stenosis of right carotid artery: Secondary | ICD-10-CM | POA: Insufficient documentation

## 2020-05-22 ENCOUNTER — Other Ambulatory Visit: Payer: Self-pay

## 2020-05-22 ENCOUNTER — Encounter: Payer: Self-pay | Admitting: Vascular Surgery

## 2020-05-22 ENCOUNTER — Encounter (HOSPITAL_COMMUNITY): Payer: Medicare Other

## 2020-05-22 ENCOUNTER — Ambulatory Visit (INDEPENDENT_AMBULATORY_CARE_PROVIDER_SITE_OTHER): Payer: Self-pay | Admitting: Vascular Surgery

## 2020-05-22 VITALS — BP 106/73 | HR 78 | Temp 97.9°F | Resp 20 | Ht 72.0 in | Wt 306.0 lb

## 2020-05-22 DIAGNOSIS — I6523 Occlusion and stenosis of bilateral carotid arteries: Secondary | ICD-10-CM

## 2020-05-22 NOTE — Progress Notes (Signed)
Patient name: Jorge [name] MRN: [id number] DOB: 03-Jul-1945 Sex: male  REASON FOR CONSULT: Postop check after redo left TCAR  HPI: Jorge [name] is a 75 y.o. male, who presents for postop check after redo left TCAR.  He previously had a left carotid endarterectomy and also a previous left TCAR with Dr. Darrick Penna.  He was recently seen in the hospital with a TIA left brain event thought to be from restenosis of his carotid stent placed in 2019 during TCAR.  Ultimately underwent a redo cutdown above the left clavicle with placement of an additional carotid stent to treat the distal stenosis within his existing stent.  He ultimately did well and was discharged home.  He states that he is on aspirin at this time but only took Plavix for 30 days.  He is also on Eliquis for his A. fib.  He has no new concerns today.  Past Medical History:  Diagnosis Date  . Anxiety    situational -   . Asthma due to seasonal allergies   . Atrial fibrillation (HCC)    pt on Eliquis  . Chest pain   . CHF (congestive heart failure) (HCC)   . Depression    situational  . Dyspnea    when I walk a long distance  . Dysrhythmia   . Edema   . GERD (gastroesophageal reflux disease)    ocassional  . Heart murmur   . HTN (hypertension)   . Hyperlipidemia   . Hypothyroidism   . Neuropathy   . Obesity   . Peripheral vascular disease (HCC)    carotid   . Renal insufficiency 12/04/2017  . Sleep apnea   . SOB (shortness of breath)   . Stroke [facility])    Right side weakness, using a cane    Past Surgical History:  Procedure Laterality Date  . BACK SURGERY     LUmbar laminectomy  . CARDIAC CATHETERIZATION    . CARDIAC CATHETERIZATION N/A 04/30/2016   Procedure: Right/Left Heart Cath and Coronary Angiography;  Surgeon: Kathleene Hazel, MD;  Location: [facility] INVASIVE CV LAB;  Service: Cardiovascular;  Laterality: N/A;  . CAROTID ANGIOGRAM N/A 05/30/2014   Procedure: Dorise Bullion;  Surgeon: Nada Libman, MD;  Location: [facility] CATH LAB;  Service: Cardiovascular;  Laterality: N/A;  . EYE SURGERY Right    cataract  . EYE SURGERY Left    bioocular lens implanted due injury  . LUMBAR FUSION    . MULTIPLE EXTRACTIONS WITH ALVEOLOPLASTY N/A 05/02/2016   Procedure: Extraction of tooth #'s 7, 10, 23, 24, 25,and 26 with alveoloplasty.;  Surgeon: Charlynne Pander, DDS;  Location: [facility] OR;  Service: Oral Surgery;  Laterality: N/A;  . TEE WITHOUT CARDIOVERSION N/A 05/06/2016   Procedure: TRANSESOPHAGEAL ECHOCARDIOGRAM (TEE);  Surgeon: Kathleene Hazel, MD;  Location: [facility] OR;  Service: Open Heart Surgery;  Laterality: N/A;  . TRANSCAROTID ARTERY REVASCULARIZATION (TCAR)  01/15/2018   TRANSCAROTID ARTERY REVASCULARIZATION LEFT   . TRANSCAROTID ARTERY REVASCULARIZATION Left 01/15/2018   Procedure: TRANSCAROTID ARTERY REVASCULARIZATION LEFT;  Surgeon: Sherren Kerns, MD;  Location: [facility] OR;  Service: Vascular;  Laterality: Left;  . TRANSCAROTID ARTERY REVASCULARIZATION Left 04/25/2020   Procedure: TRANSCAROTID ARTERY REVASCULARIZATION;  Surgeon: Cephus Shelling, MD;  Location: [facility] OR;  Service: Vascular;  Laterality: Left;  . TRANSCATHETER AORTIC VALVE REPLACEMENT, TRANSFEMORAL N/A 05/06/2016   Procedure: TRANSCATHETER AORTIC VALVE REPLACEMENT, TRANSFEMORAL using a 52mm Edwards Sapien 3 Transcatheter Heart Valve;  Surgeon: Cristal Deer  Ellene Route, MD;  Location: [facility] OR;  Service: Open Heart Surgery;  Laterality: N/A;  . ULTRASOUND GUIDANCE FOR VASCULAR ACCESS Right 04/25/2020   Procedure: ULTRASOUND GUIDANCE FOR VASCULAR ACCESS;  Surgeon: Cephus Shelling, MD;  Location: [facility] OR;  Service: Vascular;  Laterality: Right;    Family History  Problem Relation Age of Onset  . Heart attack Mother   . Stroke Father     SOCIAL HISTORY: Social History   Socioeconomic History  . Marital status: Divorced    Spouse name: Not on file  . Number of children: 3  . Years of education: Not on file  .  Highest education level: Not on file  Occupational History  . Not on file  Tobacco Use  . Smoking status: Former Games developer  . Smokeless tobacco: Never Used  . Tobacco comment: quit early 1980's   Vaping Use  . Vaping Use: Never used  Substance and Sexual Activity  . Alcohol use: No  . Drug use: No  . Sexual activity: Not on file  Other Topics Concern  . Not on file  Social History Narrative  . Not on file   Social Determinants of Health   Financial Resource Strain: Not on file  Food Insecurity: Not on file  Transportation Needs: Not on file  Physical Activity: Not on file  Stress: Not on file  Social Connections: Not on file  Intimate Partner Violence: Not on file    No Known Allergies  Current Outpatient Medications  Medication Sig Dispense Refill  . acetaminophen (TYLENOL) 650 MG CR tablet Take 1,300 mg by mouth every 8 (eight) hours as needed for pain.    Marland Kitchen apixaban (ELIQUIS) 5 MG TABS tablet Take 1 tablet (5 mg total) by mouth 2 (two) times daily. 60 tablet 3  . aspirin EC 81 MG tablet Take 1 tablet (81 mg total) by mouth daily. 150 tablet 2  . atorvastatin (LIPITOR) 40 MG tablet Take 40 mg by mouth at bedtime.    . furosemide (LASIX) 20 MG tablet Take 20 mg by mouth daily.    Marland Kitchen gabapentin (NEURONTIN) 600 MG tablet Take 600 mg by mouth in the morning and at bedtime.    Marland Kitchen levothyroxine (SYNTHROID) 50 MCG tablet Take 50 mcg by mouth See admin instructions. Take one tablet (50 mcg) by mouth on Saturday, Sunday, Tuesday, Thursday nights (take 75 mcg on the other days)    . levothyroxine (SYNTHROID) 75 MCG tablet Take 75 mcg by mouth See admin instructions. Take one tablet (75 mcg) by mouth Monday, Wednesday, Friday nights (take 50 mcg on the other days)    . lisinopril (ZESTRIL) 10 MG tablet Take 10 mg by mouth daily.    . metoprolol tartrate (LOPRESSOR) 25 MG tablet Take 25 mg by mouth 2 (two) times daily.    Marland Kitchen omeprazole (PRILOSEC) 40 MG capsule Take 40 mg by mouth at  bedtime.    Marland Kitchen oxybutynin (DITROPAN-XL) 10 MG 24 hr tablet Take 10 mg by mouth daily.    . polyethylene glycol powder (GLYCOLAX/MIRALAX) powder Take 17 g by mouth daily. (Patient taking differently: Take 17 g by mouth daily as needed (constipation).) 255 g 0  . clopidogrel (PLAVIX) 75 MG tablet Take 1 tablet (75 mg total) by mouth daily. For 30days, then stop. (Patient not taking: Reported on 05/22/2020) 30 tablet 0  . nitroGLYCERIN (NITROSTAT) 0.4 MG SL tablet Place 1 tablet (0.4 mg total) under the tongue every 5 (five) minutes as needed for chest  pain. (Patient not taking: Reported on 05/22/2020) 20 tablet 1   No current facility-administered medications for this visit.    REVIEW OF SYSTEMS:  [X]  denotes positive finding, [ ]  denotes negative finding Cardiac  Comments:  Chest pain or chest pressure:    Shortness of breath upon exertion:    Short of breath when lying flat:    Irregular heart rhythm:        Vascular    Pain in calf, thigh, or hip brought on by ambulation:    Pain in feet at night that wakes you up from your sleep:     Blood clot in your veins:    Leg swelling:         Pulmonary    Oxygen at home:    Productive cough:     Wheezing:         Neurologic    Sudden weakness in arms or legs:     Sudden numbness in arms or legs:     Sudden onset of difficulty speaking or slurred speech:    Temporary loss of vision in one eye:     Problems with dizziness:         Gastrointestinal    Blood in stool:     Vomited blood:         Genitourinary    Burning when urinating:     Blood in urine:        Psychiatric    Major depression:         Hematologic    Bleeding problems:    Problems with blood clotting too easily:        Skin    Rashes or ulcers:        Constitutional    Fever or chills:      PHYSICAL EXAM: Vitals:   05/22/20 1548 05/22/20 1553  BP: 110/75 106/73  Pulse: 66 78  Resp: 20   Temp: 97.9 F (36.6 C)   TempSrc: Temporal   SpO2: 94%    Weight: (!) 306 lb (138.8 kg)   Height: 6' (1.829 m)     GENERAL: The patient is a well-nourished male, in no acute distress. The vital signs are documented above. CARDIAC: There is a regular rate and rhythm.  VASCULAR:  Left neck incision above clavicle healing NEUROLOGIC: No focal weakness or paresthesias are detected.  CN II-XII grossly intact.   DATA:   Carotid duplex 05/02/2020 at his cardiology office shows patent left carotid stent with no in-stent restenosis  Assessment/Plan:  75 year old male status post redo left TCAR on 04/25/2020 for left brain TIA thought to be from restenosis of his existing carotid stent from 2019.  Duplex on 05/02/2020 shows a widely patent left carotid stent after redo TCAR.  Very pleased with his progress.  Appears neurologically intact today.  Discussed I will see him in 6 months for carotid duplex for surveillance of his left TCAR stent but also for surveillance of his asymptomatic moderate 60-79% right ICA stenosis.   2020, MD [facility] Office: [phone]

## 2020-05-23 ENCOUNTER — Other Ambulatory Visit: Payer: Self-pay

## 2020-05-23 DIAGNOSIS — I6523 Occlusion and stenosis of bilateral carotid arteries: Secondary | ICD-10-CM

## 2020-06-05 ENCOUNTER — Encounter (HOSPITAL_COMMUNITY): Payer: Medicare Other

## 2020-06-05 ENCOUNTER — Encounter: Payer: Medicare Other | Admitting: Vascular Surgery

## 2020-07-19 NOTE — Telephone Encounter (Signed)
Open in error

## 2020-09-05 ENCOUNTER — Inpatient Hospital Stay (HOSPITAL_COMMUNITY)
Admission: EM | Admit: 2020-09-05 | Discharge: 2020-09-13 | DRG: 308 | Disposition: A | Discharge status: Home Health | Payer: Medicare Other | Attending: Internal Medicine | Admitting: Internal Medicine

## 2020-09-05 ENCOUNTER — Other Ambulatory Visit: Payer: Self-pay

## 2020-09-05 ENCOUNTER — Emergency Department (HOSPITAL_COMMUNITY): Payer: Medicare Other

## 2020-09-05 ENCOUNTER — Encounter (HOSPITAL_COMMUNITY): Payer: Self-pay | Admitting: Emergency Medicine

## 2020-09-05 DIAGNOSIS — Z792 Long term (current) use of antibiotics: Secondary | ICD-10-CM

## 2020-09-05 DIAGNOSIS — Z9841 Cataract extraction status, right eye: Secondary | ICD-10-CM | POA: Diagnosis not present

## 2020-09-05 DIAGNOSIS — I6529 Occlusion and stenosis of unspecified carotid artery: Secondary | ICD-10-CM | POA: Diagnosis present

## 2020-09-05 DIAGNOSIS — I2510 Atherosclerotic heart disease of native coronary artery without angina pectoris: Secondary | ICD-10-CM | POA: Diagnosis present

## 2020-09-05 DIAGNOSIS — I130 Hypertensive heart and chronic kidney disease with heart failure and stage 1 through stage 4 chronic kidney disease, or unspecified chronic kidney disease: Secondary | ICD-10-CM | POA: Diagnosis present

## 2020-09-05 DIAGNOSIS — I6523 Occlusion and stenosis of bilateral carotid arteries: Secondary | ICD-10-CM | POA: Diagnosis not present

## 2020-09-05 DIAGNOSIS — I480 Paroxysmal atrial fibrillation: Secondary | ICD-10-CM | POA: Diagnosis present

## 2020-09-05 DIAGNOSIS — Z66 Do not resuscitate: Secondary | ICD-10-CM | POA: Diagnosis present

## 2020-09-05 DIAGNOSIS — I252 Old myocardial infarction: Secondary | ICD-10-CM | POA: Diagnosis not present

## 2020-09-05 DIAGNOSIS — U071 COVID-19: Secondary | ICD-10-CM | POA: Diagnosis not present

## 2020-09-05 DIAGNOSIS — I739 Peripheral vascular disease, unspecified: Secondary | ICD-10-CM | POA: Diagnosis present

## 2020-09-05 DIAGNOSIS — Z8249 Family history of ischemic heart disease and other diseases of the circulatory system: Secondary | ICD-10-CM

## 2020-09-05 DIAGNOSIS — I4891 Unspecified atrial fibrillation: Secondary | ICD-10-CM

## 2020-09-05 DIAGNOSIS — R0602 Shortness of breath: Secondary | ICD-10-CM | POA: Diagnosis present

## 2020-09-05 DIAGNOSIS — G4733 Obstructive sleep apnea (adult) (pediatric): Secondary | ICD-10-CM | POA: Diagnosis present

## 2020-09-05 DIAGNOSIS — N183 Chronic kidney disease, stage 3 unspecified: Secondary | ICD-10-CM | POA: Diagnosis present

## 2020-09-05 DIAGNOSIS — Z952 Presence of prosthetic heart valve: Secondary | ICD-10-CM | POA: Diagnosis not present

## 2020-09-05 DIAGNOSIS — Z79890 Hormone replacement therapy (postmenopausal): Secondary | ICD-10-CM

## 2020-09-05 DIAGNOSIS — J069 Acute upper respiratory infection, unspecified: Secondary | ICD-10-CM

## 2020-09-05 DIAGNOSIS — I4892 Unspecified atrial flutter: Secondary | ICD-10-CM | POA: Diagnosis not present

## 2020-09-05 DIAGNOSIS — Z961 Presence of intraocular lens: Secondary | ICD-10-CM | POA: Diagnosis present

## 2020-09-05 DIAGNOSIS — N1832 Chronic kidney disease, stage 3b: Secondary | ICD-10-CM | POA: Diagnosis present

## 2020-09-05 DIAGNOSIS — Z823 Family history of stroke: Secondary | ICD-10-CM | POA: Diagnosis not present

## 2020-09-05 DIAGNOSIS — I10 Essential (primary) hypertension: Secondary | ICD-10-CM | POA: Diagnosis present

## 2020-09-05 DIAGNOSIS — I350 Nonrheumatic aortic (valve) stenosis: Secondary | ICD-10-CM

## 2020-09-05 DIAGNOSIS — I444 Left anterior fascicular block: Secondary | ICD-10-CM | POA: Diagnosis present

## 2020-09-05 DIAGNOSIS — E785 Hyperlipidemia, unspecified: Secondary | ICD-10-CM | POA: Diagnosis present

## 2020-09-05 DIAGNOSIS — I959 Hypotension, unspecified: Secondary | ICD-10-CM | POA: Diagnosis present

## 2020-09-05 DIAGNOSIS — Z6841 Body Mass Index (BMI) 40.0 and over, adult: Secondary | ICD-10-CM

## 2020-09-05 DIAGNOSIS — E039 Hypothyroidism, unspecified: Secondary | ICD-10-CM | POA: Diagnosis present

## 2020-09-05 DIAGNOSIS — N179 Acute kidney failure, unspecified: Secondary | ICD-10-CM | POA: Diagnosis present

## 2020-09-05 DIAGNOSIS — Z87891 Personal history of nicotine dependence: Secondary | ICD-10-CM

## 2020-09-05 DIAGNOSIS — I5033 Acute on chronic diastolic (congestive) heart failure: Secondary | ICD-10-CM | POA: Diagnosis present

## 2020-09-05 DIAGNOSIS — Z9842 Cataract extraction status, left eye: Secondary | ICD-10-CM | POA: Diagnosis not present

## 2020-09-05 DIAGNOSIS — R739 Hyperglycemia, unspecified: Secondary | ICD-10-CM | POA: Diagnosis not present

## 2020-09-05 DIAGNOSIS — Z79899 Other long term (current) drug therapy: Secondary | ICD-10-CM

## 2020-09-05 DIAGNOSIS — Z7982 Long term (current) use of aspirin: Secondary | ICD-10-CM

## 2020-09-05 DIAGNOSIS — R079 Chest pain, unspecified: Secondary | ICD-10-CM | POA: Diagnosis not present

## 2020-09-05 DIAGNOSIS — I6930 Unspecified sequelae of cerebral infarction: Secondary | ICD-10-CM

## 2020-09-05 DIAGNOSIS — I447 Left bundle-branch block, unspecified: Secondary | ICD-10-CM | POA: Diagnosis present

## 2020-09-05 DIAGNOSIS — Z8679 Personal history of other diseases of the circulatory system: Secondary | ICD-10-CM

## 2020-09-05 LAB — BRAIN NATRIURETIC PEPTIDE: B Natriuretic Peptide: 130.4 pg/mL — ABNORMAL HIGH (ref 0.0–100.0)

## 2020-09-05 LAB — CBC
HCT: 46.4 % (ref 39.0–52.0)
Hemoglobin: 15 g/dL (ref 13.0–17.0)
MCH: 31.2 pg (ref 26.0–34.0)
MCHC: 32.3 g/dL (ref 30.0–36.0)
MCV: 96.5 fL (ref 80.0–100.0)
Platelets: 184 10*3/uL (ref 150–400)
RBC: 4.81 MIL/uL (ref 4.22–5.81)
RDW: 14.3 % (ref 11.5–15.5)
WBC: 7.8 10*3/uL (ref 4.0–10.5)
nRBC: 0 % (ref 0.0–0.2)

## 2020-09-05 LAB — BASIC METABOLIC PANEL
Anion gap: 8 (ref 5–15)
BUN: 25 mg/dL — ABNORMAL HIGH (ref 8–23)
CO2: 26 mmol/L (ref 22–32)
Calcium: 8.6 mg/dL — ABNORMAL LOW (ref 8.9–10.3)
Chloride: 103 mmol/L (ref 98–111)
Creatinine, Ser: 1.8 mg/dL — ABNORMAL HIGH (ref 0.61–1.24)
GFR, Estimated: 39 mL/min — ABNORMAL LOW (ref >60)
Glucose, Bld: 102 mg/dL — ABNORMAL HIGH (ref 70–99)
Potassium: 4 mmol/L (ref 3.5–5.1)
Sodium: 137 mmol/L (ref 135–145)

## 2020-09-05 LAB — MAGNESIUM: Magnesium: 2.3 mg/dL (ref 1.7–2.4)

## 2020-09-05 LAB — TROPONIN I (HIGH SENSITIVITY)
Troponin I (High Sensitivity): 23 ng/L — ABNORMAL HIGH (ref <18)
Troponin I (High Sensitivity): 21 ng/L — ABNORMAL HIGH (ref <18)

## 2020-09-05 MED ORDER — SODIUM CHLORIDE 0.9 % IV BOLUS
500.0000 mL | Freq: Once | INTRAVENOUS | Status: AC
  Administered 2020-09-05: 500 mL via INTRAVENOUS

## 2020-09-05 MED ORDER — METOPROLOL TARTRATE 5 MG/5ML IV SOLN: 5.0000 mg | Freq: Once | INTRAVENOUS | Status: DC

## 2020-09-05 MED ORDER — METHYLPREDNISOLONE SODIUM SUCC 125 MG IJ SOLR
125.0000 mg | Freq: Once | INTRAMUSCULAR | Status: AC
  Administered 2020-09-05: 125 mg via INTRAVENOUS
  Filled 2020-09-05: qty 2

## 2020-09-05 MED ORDER — AMIODARONE HCL IN DEXTROSE 360-4.14 MG/200ML-% IV SOLN
60.0000 mg/h | INTRAVENOUS | Status: AC
  Administered 2020-09-05: 60 mg/h via INTRAVENOUS
  Filled 2020-09-05 (×2): qty 200

## 2020-09-05 MED ORDER — ETOMIDATE 2 MG/ML IV SOLN
20.0000 mg | Freq: Once | INTRAVENOUS | Status: AC
  Administered 2020-09-05: 20 mg via INTRAVENOUS

## 2020-09-05 MED ORDER — AMIODARONE HCL IN DEXTROSE 360-4.14 MG/200ML-% IV SOLN
30.0000 mg/h | INTRAVENOUS | Status: DC
  Administered 2020-09-06 – 2020-09-11 (×12): 30 mg/h via INTRAVENOUS
  Filled 2020-09-05 (×10): qty 200

## 2020-09-05 MED ORDER — ETOMIDATE 2 MG/ML IV SOLN
INTRAVENOUS | Status: AC | PRN
  Administered 2020-09-05: 20 mg via INTRAVENOUS

## 2020-09-05 NOTE — ED Notes (Signed)
Pt alert talking to cardiology

## 2020-09-05 NOTE — Progress Notes (Signed)
RT NOTE:  RT at bedside for Cardioversion. AMBU bag available at bedside. Pt wearing ETCO2 cannula @ 2L. Pt stable throughout. Jaw thrust and oral suction provided.

## 2020-09-05 NOTE — ED Triage Notes (Signed)
Pt with four days of chest and flank pain. Hx of problems with R kidney but never left. Reports he doesn't feel as if he's emptying his bladder/the flow is not good. 324 ASA given by EMS. Endorses shob with the cp.

## 2020-09-05 NOTE — H&P (Signed)
History and Physical   [name] ZOX:[id number] DOB: 1946-03-21 DOA: 09/05/2020  Referring MD/NP/PA: Dr. Wilkie Aye  PCP: [name], PA-C   Outpatient Specialists: None  Patient coming from: Home  Chief Complaint: Shortness of breath cough and palpitation   HPI: Jorge [name] is a 75 y.o. male with medical history significant ofparoxysmal atrial fibrillation, chronic kidney disease stage III, diastolic heart failure, peripheral vascular disease, essential hypertension, history of due to aortic valve replacement, anxiety disorder, history of asthma who presented to the ER initially with 3 days of shortness of breath chest discomfort as well as weakness.  Patient has been having this upper respiratory infection for the last 3 days that has bothered him.  Has not taking any medications for that.  He has had some night sweats.  Also fatigue and some chills.  He denied any sick contacts.  Patient has been vaccinated against COVID-19.  In the ER patient was found to be in atrial fibrillation with rapid ventricular response.  He was having marginal blood pressure.  Was seen by cardiologist in the ER with attempted cardioversion as patient is already chronically anticoagulated.  This has failed to convert patient to sinus rhythm.  His blood pressure has slowly improved.  Recommendation is to admit to medical service with cardiology follow-up.  While waiting for admission patient is COVID-19 is now back positive.  He is probably therefore having A. fib with RVR triggered by COVID-19 infection..   .  ED Course: Temperature 98.8 initial blood pressure 79/47, pulse 154 respiratory 28 oxygen sat 91% on room air.  Chemistry showed creatinine 1.80 which is baseline glucose 102 and BUN 25.  Troponin 23 and 21.  BNP 130.  Magnesium 2.3.  CBC entirely within normal.  Respiratory screen showed COVID-19 positive.  Chest x-ray is clear.  Head CT without contrast is negative.  Patient therefore admitted to the  hospital with A. fib with RVR in the setting of COVID-19 infection.  Review of Systems: As per HPI otherwise 10 point review of systems negative.    Past Medical History:  Diagnosis Date  . Anxiety    situational -   . Asthma due to seasonal allergies   . Atrial fibrillation (HCC)    pt on Eliquis  . Chest pain   . CHF (congestive heart failure) (HCC)   . Depression    situational  . Dyspnea    when I walk a long distance  . Dysrhythmia   . Edema   . GERD (gastroesophageal reflux disease)    ocassional  . Heart murmur   . HTN (hypertension)   . Hyperlipidemia   . Hypothyroidism   . Neuropathy   . Obesity   . Peripheral vascular disease (HCC)    carotid   . Renal insufficiency 12/04/2017  . Sleep apnea   . SOB (shortness of breath)   . Stroke [facility])    Right side weakness, using a cane    Past Surgical History:  Procedure Laterality Date  . BACK SURGERY     LUmbar laminectomy  . CARDIAC CATHETERIZATION    . CARDIAC CATHETERIZATION N/A 04/30/2016   Procedure: Right/Left Heart Cath and Coronary Angiography;  Surgeon: Kathleene Hazel, MD;  Location: [facility] INVASIVE CV LAB;  Service: Cardiovascular;  Laterality: N/A;  . CAROTID ANGIOGRAM N/A 05/30/2014   Procedure: Dorise Bullion;  Surgeon: Nada Libman, MD;  Location: [facility] CATH LAB;  Service: Cardiovascular;  Laterality: N/A;  . EYE SURGERY Right  cataract  . EYE SURGERY Left    bioocular lens implanted due injury  . LUMBAR FUSION    . MULTIPLE EXTRACTIONS WITH ALVEOLOPLASTY N/A 05/02/2016   Procedure: Extraction of tooth #'s 7, 10, 23, 24, 25,and 26 with alveoloplasty.;  Surgeon: Charlynne Pander, DDS;  Location: [facility] OR;  Service: Oral Surgery;  Laterality: N/A;  . TEE WITHOUT CARDIOVERSION N/A 05/06/2016   Procedure: TRANSESOPHAGEAL ECHOCARDIOGRAM (TEE);  Surgeon: Kathleene Hazel, MD;  Location: [facility] OR;  Service: Open Heart Surgery;  Laterality: N/A;  . TRANSCAROTID ARTERY REVASCULARIZATION (TCAR)   01/15/2018   TRANSCAROTID ARTERY REVASCULARIZATION LEFT   . TRANSCAROTID ARTERY REVASCULARIZATION Left 01/15/2018   Procedure: TRANSCAROTID ARTERY REVASCULARIZATION LEFT;  Surgeon: Sherren Kerns, MD;  Location: [facility] OR;  Service: Vascular;  Laterality: Left;  . TRANSCAROTID ARTERY REVASCULARIZATION Left 04/25/2020   Procedure: TRANSCAROTID ARTERY REVASCULARIZATION;  Surgeon: Cephus Shelling, MD;  Location: [facility] OR;  Service: Vascular;  Laterality: Left;  . TRANSCATHETER AORTIC VALVE REPLACEMENT, TRANSFEMORAL N/A 05/06/2016   Procedure: TRANSCATHETER AORTIC VALVE REPLACEMENT, TRANSFEMORAL using a 73mm Edwards Sapien 3 Transcatheter Heart Valve;  Surgeon: Kathleene Hazel, MD;  Location: [facility] OR;  Service: Open Heart Surgery;  Laterality: N/A;  . ULTRASOUND GUIDANCE FOR VASCULAR ACCESS Right 04/25/2020   Procedure: ULTRASOUND GUIDANCE FOR VASCULAR ACCESS;  Surgeon: Cephus Shelling, MD;  Location: [facility] OR;  Service: Vascular;  Laterality: Right;     reports that he has quit smoking. He has never used smokeless tobacco. He reports that he does not drink alcohol and does not use drugs.  No Known Allergies  Family History  Problem Relation Age of Onset  . Heart attack Mother   . Stroke Father      Prior to Admission medications   Medication Sig Start Date End Date Taking? Authorizing Provider  acetaminophen (TYLENOL) 650 MG CR tablet Take 1,300 mg by mouth every 8 (eight) hours as needed for pain.    [provider]  apixaban (ELIQUIS) 5 MG TABS tablet Take 1 tablet (5 mg total) by mouth 2 (two) times daily. 04/26/20   Rai, Delene Ruffini, MD  aspirin EC 81 MG tablet Take 1 tablet (81 mg total) by mouth daily. 04/26/20   Rai, Delene Ruffini, MD  atorvastatin (LIPITOR) 40 MG tablet Take 40 mg by mouth at bedtime. 04/29/19   [provider]  furosemide (LASIX) 20 MG tablet Take 20 mg by mouth daily. 03/26/20   [provider]  gabapentin (NEURONTIN) 600 MG tablet  Take 600 mg by mouth in the morning and at bedtime. 07/29/19   [provider]  levothyroxine (SYNTHROID) 50 MCG tablet Take 50 mcg by mouth See admin instructions. Take one tablet (50 mcg) by mouth on Saturday, Sunday, Tuesday, Thursday nights (take 75 mcg on the other days) 08/01/19   [provider]  levothyroxine (SYNTHROID) 75 MCG tablet Take 75 mcg by mouth See admin instructions. Take one tablet (75 mcg) by mouth Monday, Wednesday, Friday nights (take 50 mcg on the other days)    [provider]  lisinopril (ZESTRIL) 10 MG tablet Take 10 mg by mouth daily. 03/15/19   [provider]  metoprolol tartrate (LOPRESSOR) 25 MG tablet Take 25 mg by mouth 2 (two) times daily. 04/09/19   [provider]  nitroGLYCERIN (NITROSTAT) 0.4 MG SL tablet Place 1 tablet (0.4 mg total) under the tongue every 5 (five) minutes as needed for chest pain. Patient not taking: Reported on 05/22/2020 12/05/17  Kathlen ModyAkula, Vijaya, MD  omeprazole (PRILOSEC) 40 MG capsule Take 40 mg by mouth at bedtime.    [provider]  oxybutynin (DITROPAN-XL) 10 MG 24 hr tablet Take 10 mg by mouth daily. 04/27/19   [provider]  polyethylene glycol powder (GLYCOLAX/MIRALAX) powder Take 17 g by mouth daily. Patient taking differently: Take 17 g by mouth daily as needed (constipation). 03/01/17   Geoffery Lyonselo, Douglas, MD    Physical Exam: Vitals:   09/05/20 2150 09/05/20 2155 09/05/20 2215 09/05/20 2230  BP: 108/75 99/88 102/86 (!) 105/51  Pulse: (!) 126 (!) 106 (!) 143 (!) 154  Resp: (!) 21 (!) 28 (!) 26 (!) 22  Temp:      TempSrc:      SpO2: 100% 100% 100% 98%      Constitutional: Morbidly obese, acutely ill looking, no distress Vitals:   09/05/20 2150 09/05/20 2155 09/05/20 2215 09/05/20 2230  BP: 108/75 99/88 102/86 (!) 105/51  Pulse: (!) 126 (!) 106 (!) 143 (!) 154  Resp: (!) 21 (!) 28 (!) 26 (!) 22  Temp:      TempSrc:      SpO2: 100% 100% 100% 98%   Eyes: PERRL,  lids and conjunctivae normal ENMT: Mucous membranes are dry. Posterior pharynx clear of any exudate or lesions.Normal dentition.  Neck: normal, supple, no masses, no thyromegaly Respiratory: clear to auscultation bilaterally, no wheezing, no crackles.  Increased respiratory effort with some rales no accessory muscle use.  Cardiovascular: Irregularly irregular rhythm with tachycardia no murmurs / rubs / gallops. No extremity edema. 2+ pedal pulses. No carotid bruits.  Abdomen: no tenderness, no masses palpated. No hepatosplenomegaly. Bowel sounds positive.  Musculoskeletal: no clubbing / cyanosis. No joint deformity upper and lower extremities. Good ROM, no contractures. Normal muscle tone.  Skin: no rashes, lesions, ulcers. No induration Neurologic: CN 2-12 grossly intact. Sensation intact, DTR normal. Strength 5/5 in all 4.  Psychiatric: Normal judgment and insight. Alert and oriented x 3. Normal mood.     Labs on Admission: I have personally reviewed following labs and imaging studies  CBC: Recent Labs  Lab 09/05/20 1501  WBC 7.8  HGB 15.0  HCT 46.4  MCV 96.5  PLT 184   Basic Metabolic Panel: Recent Labs  Lab 09/05/20 1501 09/05/20 1944  NA 137  --   K 4.0  --   CL 103  --   CO2 26  --   GLUCOSE 102*  --   BUN 25*  --   CREATININE 1.80*  --   CALCIUM 8.6*  --   MG  --  2.3   GFR: CrCl cannot be calculated (Unknown ideal weight.). Liver Function Tests: No results for input(s): AST, ALT, ALKPHOS, BILITOT, PROT, ALBUMIN in the last 168 hours. No results for input(s): LIPASE, AMYLASE in the last 168 hours. No results for input(s): AMMONIA in the last 168 hours. Coagulation Profile: No results for input(s): INR, PROTIME in the last 168 hours. Cardiac Enzymes: No results for input(s): CKTOTAL, CKMB, CKMBINDEX, TROPONINI in the last 168 hours. BNP (last 3 results) No results for input(s): PROBNP in the last 8760 hours. HbA1C: No results for input(s): HGBA1C in the last  72 hours. CBG: No results for input(s): GLUCAP in the last 168 hours. Lipid Profile: No results for input(s): CHOL, HDL, LDLCALC, TRIG, CHOLHDL, LDLDIRECT in the last 72 hours. Thyroid Function Tests: No results for input(s): TSH, T4TOTAL, FREET4, T3FREE, THYROIDAB in the last 72 hours. Anemia Panel: No results  for input(s): VITAMINB12, FOLATE, FERRITIN, TIBC, IRON, RETICCTPCT in the last 72 hours. Urine analysis:    Component Value Date/Time   COLORURINE YELLOW 09/05/2019 1343   APPEARANCEUR CLEAR 09/05/2019 1343   LABSPEC 1.006 09/05/2019 1343   PHURINE 5.0 09/05/2019 1343   GLUCOSEU NEGATIVE 09/05/2019 1343   HGBUR MODERATE (A) 09/05/2019 1343   BILIRUBINUR NEGATIVE 09/05/2019 1343   KETONESUR NEGATIVE 09/05/2019 1343   PROTEINUR NEGATIVE 09/05/2019 1343   NITRITE NEGATIVE 09/05/2019 1343   LEUKOCYTESUR LARGE (A) 09/05/2019 1343   Sepsis Labs: @LABRCNTIP (procalcitonin:4,lacticidven:4) ) Recent Results (from the past 240 hour(s))  Resp Panel by RT-PCR (Flu A&B, Covid) Nasopharyngeal Swab     Status: Abnormal   Collection Time: 09/05/20 10:46 PM   Specimen: Nasopharyngeal Swab; Nasopharyngeal(NP) swabs in vial transport medium  Result Value Ref Range Status   SARS Coronavirus 2 by RT PCR POSITIVE (A) NEGATIVE Final    Comment: RESULT CALLED TO, READ BACK BY AND VERIFIED WITH: RN CHRIS K. 11/05/20 0030 FCP (NOTE) SARS-CoV-2 target nucleic acids are DETECTED.  The SARS-CoV-2 RNA is generally detectable in upper respiratory specimens during the acute phase of infection. Positive results are indicative of the presence of the identified virus, but do not rule out bacterial infection or co-infection with other pathogens not detected by the test. Clinical correlation with patient history and other diagnostic information is necessary to determine patient infection status. The expected result is Negative.  Fact Sheet for Patients: E9598085  Fact  Sheet for Healthcare Providers: BloggerCourse.com  This test is not yet approved or cleared by the [employer] FDA and  has been authorized for detection and/or diagnosis of SARS-CoV-2 by FDA under an Emergency Use Authorization (EUA).  This EUA will remain in effect (meaning this test can be used)  for the duration of  the COVID-19 declaration under Section 564(b)(1) of the Act, 21 U.S.C. section 360bbb-3(b)(1), unless the authorization is terminated or revoked sooner.     Influenza A by PCR NEGATIVE NEGATIVE Final   Influenza B by PCR NEGATIVE NEGATIVE Final    Comment: (NOTE) The Xpert Xpress SARS-CoV-2/FLU/RSV plus assay is intended as an aid in the diagnosis of influenza from Nasopharyngeal swab specimens and should not be used as a sole basis for treatment. Nasal washings and aspirates are unacceptable for Xpert Xpress SARS-CoV-2/FLU/RSV testing.  Fact Sheet for Patients: [place]  Fact Sheet for Healthcare Providers: BloggerCourse.com  This test is not yet approved or cleared by the [employer]ious[employer]ker.it FDA and has been authorized for detection and/or diagnosis of SARS-CoV-2 by FDA under an Emergency Use Authorization (EUA). This EUA will remain in effect (meaning this test can be used) for the duration of the COVID-19 declaration under Section 564(b)(1) of the Act, 21 U.S.C. section 360bbb-3(b)(1), unless the authorization is terminated or revoked.  Performed at [facility], [address], [place], [place]      Radiological Exams on Admission: DG Chest 1 View  Result Date: 09/05/2020 CLINICAL DATA:  Chest pain EXAM: CHEST  1 VIEW COMPARISON:  April 23, 2020 FINDINGS: Mild elevation of right hemidiaphragm is stable. There is no edema or airspace opacity. Heart is upper normal in size with pulmonary vascularity normal. Patient is status post aortic valve replacement. No  evident adenopathy. No pneumothorax. No bone lesions. IMPRESSION: Stable elevation of right hemidiaphragm. No edema or airspace opacity. Stable cardiac silhouette. Status post aortic valve replacement. Electronically Signed   By: April 25, 2020 III M.D.   On:  09/05/2020 16:01   CT Head Wo Contrast  Result Date: 09/05/2020 CLINICAL DATA:  Left-sided headache and dizziness EXAM: CT HEAD WITHOUT CONTRAST TECHNIQUE: Contiguous axial images were obtained from the base of the skull through the vertex without intravenous contrast. COMPARISON:  MRI head 04/23/2020 FINDINGS: Brain: Generalized atrophy most prominent the frontal lobes. Enlargement of the frontal horns due to atrophy. Mild patchy white matter hypodensity. Negative for acute infarct, hemorrhage, or mass lesion. Vascular: Negative for hyperdense vessel Skull: Negative Sinuses/Orbits: Mild mucosal edema left maxillary sinus. No orbital mass. Other: None IMPRESSION: Frontal lobe atrophy and mild white matter changes. No acute abnormality. Electronically Signed   By: Marlan Palau M.D.   On: 09/05/2020 18:56    EKG: Independently reviewed.  It shows atrial fibrillation with a rate in the 150s.  Assessment/Plan Principal Problem:   Rapid atrial fibrillation [facility]) Active Problems:   Aortic stenosis   CAD (coronary artery disease)   HLD (hyperlipidemia)   Essential hypertension   History of cerebrovascular accident (CVA) with residual deficit   OSA on CPAP   Acute on chronic diastolic congestive heart failure (HCC)   Carotid artery stenosis   CKD (chronic kidney disease) stage 3, GFR 30-59 ml/min (HCC)   COVID-19 virus infection     #1 A. fib with RVR: Patient has received Cardizem initially but blood pressure is marginal.  Attempted cardioversion has failed.  Patient is to continue on amiodarone at the moment.  Admit to progressive care.  Continue per cardiology.  Treat underlying trigger including COVID-19.  #2 COVID-19 infection:  Incidental finding.  Patient has been having upper respite tract infection symptoms.  No evidence of pneumonia.  Will consider initiation of dexamethasone.  May be a candidate for remdesivir early one-time dose.  #3 coronary artery disease: Continue monitoring on telemetry Will confirm home regimen and continue.  Currently on getting beta-blockade and amiodarone.  #4 morbid obesity: Dietary counseling.  #5 chronic kidney disease stage III: At baseline.  Continue to monitor  #6 chronic diastolic heart failure: At baseline.  Holding medications due to hypotension.  #7 essential hypertension: Blood pressure on the low side.  Continue monitoring  #8 hyperlipidemia: Continue statin   DVT prophylaxis: Lovenox Code Status: Full code Family Communication: No family at bedside Disposition Plan: To be determined Consults called: Cardiology Dr. Hyacinth Meeker Admission status: Inpatient  Severity of Illness: The appropriate patient status for this patient is INPATIENT. Inpatient status is judged to be reasonable and necessary in order to provide the required intensity of service to ensure the patient's safety. The patient's presenting symptoms, physical exam findings, and initial radiographic and laboratory data in the context of their chronic comorbidities is felt to place them at high risk for further clinical deterioration. Furthermore, it is not anticipated that the patient will be medically stable for discharge from the hospital within 2 midnights of admission. The following factors support the patient status of inpatient.   " The patient's presenting symptoms include cough with shortness of breath and tachycardia. " The worrisome physical exam findings include persistent tachycardia with irregular rhythm. " The initial radiographic and laboratory data are worrisome because of evidence of COVID-19 infection. " The chronic co-morbidities include paroxysmal atrial fibrillation .   * I certify that  at the point of admission it is my clinical judgment that the patient will require inpatient hospital care spanning beyond 2 midnights from the point of admission due to high intensity of service, high risk for further deterioration and  high frequency of surveillance required.Lonia Blood MD [facility] Pager [phone]  If 7PM-7AM, please contact night-coverage [email or web address] Password [facility]  09/06/2020, 12:59 AM

## 2020-09-05 NOTE — ED Notes (Signed)
bp low  Pt reminded to keep lt arm straight , help elevate the bp  Lopressor held for now

## 2020-09-05 NOTE — Consult Note (Signed)
Cardiology Consultation:   Patient ID: Jorge [name] MRN: [id number]; DOB: 07-16-1945  Admit date: 09/05/2020 Date of Consult: 09/05/2020  Primary Care Provider: [name], PA-C Primary Cardiologist: Charlton Haws, MD  Primary Electrophysiologist:  None    Patient Profile:   Jorge [name] is a 75 y.o. male with a hx of pAfib, HTN, PVD, TAVR, CKD3, diastolic heart failure who is being seen today for the evaluation of shortness of breath, chest discomfort at the request of emergency department.    History of Present Illness:   [name] is a 75 year old man with a history of paroxysmal atrial fibrillation, hypertension, vascular disease, TAVR, chronic kidney disease, diastolic heart failure who is presenting with 3 days of worsening shortness of breath, cough, night sweats, chest discomfort.  Notably, on arrival to the emergency department he was in atrial fibrillation with RVR with heart rates in the 140s 150s.  He does not note any medication changes and has been compliant with his anticoagulation and other meds.  Does note that over the last 3 to 4 days has developed a persistent cough and night sweats.  Has felt fatigued and some chills.  No known sick contacts, however has been going out without a mask.  Been having some sneezing itchy watery eyes and other viral symptoms.  Is COVID vaccinated.  On arrival to the ED, cardiology was called and given that he did not miss any anticoagulation and hypotension with RVR, synchronized cardioversion was attempted twice with reversion to normal sinus rhythm.  However, he quickly reverted back to atrial fibrillation with RVR.  When I saw him in the emergency department, he was complaining of palpitations and some chest discomfort.  His heart rate was 155 and irregular.  Past Medical History:  Diagnosis Date  . Anxiety    situational -   . Asthma due to seasonal allergies   . Atrial fibrillation (HCC)    pt on Eliquis  . Chest pain    . CHF (congestive heart failure) (HCC)   . Depression    situational  . Dyspnea    when I walk a long distance  . Dysrhythmia   . Edema   . GERD (gastroesophageal reflux disease)    ocassional  . Heart murmur   . HTN (hypertension)   . Hyperlipidemia   . Hypothyroidism   . Neuropathy   . Obesity   . Peripheral vascular disease (HCC)    carotid   . Renal insufficiency 12/04/2017  . Sleep apnea   . SOB (shortness of breath)   . Stroke [facility])    Right side weakness, using a cane    Past Surgical History:  Procedure Laterality Date  . BACK SURGERY     LUmbar laminectomy  . CARDIAC CATHETERIZATION    . CARDIAC CATHETERIZATION N/A 04/30/2016   Procedure: Right/Left Heart Cath and Coronary Angiography;  Surgeon: Kathleene Hazel, MD;  Location: [facility] INVASIVE CV LAB;  Service: Cardiovascular;  Laterality: N/A;  . CAROTID ANGIOGRAM N/A 05/30/2014   Procedure: Dorise Bullion;  Surgeon: Nada Libman, MD;  Location: [facility] CATH LAB;  Service: Cardiovascular;  Laterality: N/A;  . EYE SURGERY Right    cataract  . EYE SURGERY Left    bioocular lens implanted due injury  . LUMBAR FUSION    . MULTIPLE EXTRACTIONS WITH ALVEOLOPLASTY N/A 05/02/2016   Procedure: Extraction of tooth #'s 7, 10, 23, 24, 25,and 26 with alveoloplasty.;  Surgeon: Charlynne Pander, DDS;  Location: [facility] OR;  Service: Oral Surgery;  Laterality: N/A;  . TEE WITHOUT CARDIOVERSION N/A 05/06/2016   Procedure: TRANSESOPHAGEAL ECHOCARDIOGRAM (TEE);  Surgeon: Kathleene Hazel, MD;  Location: [facility] OR;  Service: Open Heart Surgery;  Laterality: N/A;  . TRANSCAROTID ARTERY REVASCULARIZATION (TCAR)  01/15/2018   TRANSCAROTID ARTERY REVASCULARIZATION LEFT   . TRANSCAROTID ARTERY REVASCULARIZATION Left 01/15/2018   Procedure: TRANSCAROTID ARTERY REVASCULARIZATION LEFT;  Surgeon: Sherren Kerns, MD;  Location: [facility] OR;  Service: Vascular;  Laterality: Left;  . TRANSCAROTID ARTERY REVASCULARIZATION Left 04/25/2020    Procedure: TRANSCAROTID ARTERY REVASCULARIZATION;  Surgeon: Cephus Shelling, MD;  Location: [facility] OR;  Service: Vascular;  Laterality: Left;  . TRANSCATHETER AORTIC VALVE REPLACEMENT, TRANSFEMORAL N/A 05/06/2016   Procedure: TRANSCATHETER AORTIC VALVE REPLACEMENT, TRANSFEMORAL using a 6mm Edwards Sapien 3 Transcatheter Heart Valve;  Surgeon: Kathleene Hazel, MD;  Location: [facility] OR;  Service: Open Heart Surgery;  Laterality: N/A;  . ULTRASOUND GUIDANCE FOR VASCULAR ACCESS Right 04/25/2020   Procedure: ULTRASOUND GUIDANCE FOR VASCULAR ACCESS;  Surgeon: Cephus Shelling, MD;  Location: [facility] OR;  Service: Vascular;  Laterality: Right;     Home Medications:  Prior to Admission medications   Medication Sig Start Date End Date Taking? Authorizing Provider  acetaminophen (TYLENOL) 650 MG CR tablet Take 1,300 mg by mouth every 8 (eight) hours as needed for pain.    [provider]  apixaban (ELIQUIS) 5 MG TABS tablet Take 1 tablet (5 mg total) by mouth 2 (two) times daily. 04/26/20   Rai, Delene Ruffini, MD  aspirin EC 81 MG tablet Take 1 tablet (81 mg total) by mouth daily. 04/26/20   Rai, Delene Ruffini, MD  atorvastatin (LIPITOR) 40 MG tablet Take 40 mg by mouth at bedtime. 04/29/19   [provider]  furosemide (LASIX) 20 MG tablet Take 20 mg by mouth daily. 03/26/20   [provider]  gabapentin (NEURONTIN) 600 MG tablet Take 600 mg by mouth in the morning and at bedtime. 07/29/19   [provider]  levothyroxine (SYNTHROID) 50 MCG tablet Take 50 mcg by mouth See admin instructions. Take one tablet (50 mcg) by mouth on Saturday, Sunday, Tuesday, Thursday nights (take 75 mcg on the other days) 08/01/19   [provider]  levothyroxine (SYNTHROID) 75 MCG tablet Take 75 mcg by mouth See admin instructions. Take one tablet (75 mcg) by mouth Monday, Wednesday, Friday nights (take 50 mcg on the other days)    [provider]  lisinopril (ZESTRIL) 10 MG tablet  Take 10 mg by mouth daily. 03/15/19   [provider]  metoprolol tartrate (LOPRESSOR) 25 MG tablet Take 25 mg by mouth 2 (two) times daily. 04/09/19   [provider]  nitroGLYCERIN (NITROSTAT) 0.4 MG SL tablet Place 1 tablet (0.4 mg total) under the tongue every 5 (five) minutes as needed for chest pain. Patient not taking: Reported on 05/22/2020 12/05/17   Kathlen Mody, MD  omeprazole (PRILOSEC) 40 MG capsule Take 40 mg by mouth at bedtime.    [provider]  oxybutynin (DITROPAN-XL) 10 MG 24 hr tablet Take 10 mg by mouth daily. 04/27/19   [provider]  polyethylene glycol powder (GLYCOLAX/MIRALAX) powder Take 17 g by mouth daily. Patient taking differently: Take 17 g by mouth daily as needed (constipation). 03/01/17   Geoffery Lyons, MD    Inpatient Medications: Scheduled Meds: . metoprolol tartrate  5 mg Intravenous Once   Continuous Infusions: . amiodarone 60 mg/hr (09/05/20 2238)  . [START ON 09/06/2020]  amiodarone     PRN Meds:   Allergies:   No Known Allergies  Social History:   Social History   Socioeconomic History  . Marital status: Divorced    Spouse name: Not on file  . Number of children: 3  . Years of education: Not on file  . Highest education level: Not on file  Occupational History  . Not on file  Tobacco Use  . Smoking status: Former Games developer  . Smokeless tobacco: Never Used  . Tobacco comment: quit early 1980's   Vaping Use  . Vaping Use: Never used  Substance and Sexual Activity  . Alcohol use: No  . Drug use: No  . Sexual activity: Not on file  Other Topics Concern  . Not on file  Social History Narrative  . Not on file   Social Determinants of Health   Financial Resource Strain: Not on file  Food Insecurity: Not on file  Transportation Needs: Not on file  Physical Activity: Not on file  Stress: Not on file  Social Connections: Not on file  Intimate Partner Violence: Not on file    Family History:     Family History  Problem Relation Age of Onset  . Heart attack Mother   . Stroke Father      Review of Systems: [y] = yes,  = no     General: Weight gain ; Weight loss ; Anorexia ; Fatigue ; Fever ; Chills ; Weakness    Cardiac: Chest pain/pressure ; Resting SOB ; Exertional SOB ; Orthopnea ; Pedal Edema ; Palpitations ; Syncope ; Presyncope ; Paroxysmal nocturnal dyspnea[ ]    Pulmonary: Cough ; Wheezing[ ] ; Hemoptysis[ ] ; Sputum ; Snoring    GI: Vomiting[ ] ; Dysphagia[ ] ; Melena[ ] ; Hematochezia ; Heartburn[ ] ; Abdominal pain ; Constipation ; Diarrhea ; BRBPR    GU: Hematuria[ ] ; Dysuria ; Nocturia[ ]    Vascular: Pain in legs with walking ; Pain in feet with lying flat ; Non-healing sores ; Stroke ; TIA ; Slurred speech ;   Neuro: Headaches[ ] ; Vertigo[ ] ; Seizures[ ] ; Paresthesias[ ] ;Blurred vision ; Diplopia ; Vision changes    Ortho/Skin: Arthritis ; Joint pain ; Muscle pain ; Joint swelling ; Back Pain ; Rash    Psych: Depression[ ] ; Anxiety[ ]    Heme: Bleeding problems ; Clotting disorders ; Anemia    Endocrine: Diabetes ; Thyroid dysfunction[ ]   Physical Exam/Data:   Vitals:   09/05/20 2150 09/05/20 2155 09/05/20 2215 09/05/20 2230  BP: 108/75 99/88 102/86 (!) 105/51  Pulse: (!) 126 (!) 106 (!) 143 (!) 154  Resp: (!) 21 (!) 28 (!) 26 (!) 22  Temp:      TempSrc:      SpO2: 100% 100% 100% 98%    Intake/Output Summary (Last 24 hours) at 09/05/2020 2246 Last data filed at 09/05/2020 1907 Gross per 24 hour  Intake 500 ml  Output 700 ml  Net -200 ml   There were no vitals filed for this visit. There is no height or weight on file to calculate BMI.  General:  mild  HEENT: normal Lymph: no adenopathy Neck: no JVD Endocrine:  No thryomegaly Vascular: No carotid bruits; FA pulses 2+ bilaterally without bruits  Cardiac:  normal S1,  S2; tachycardic and irregular; no murmur  Lungs:  clear to auscultation bilaterally, no wheezing, rhonchi or rales  Abd: soft, nontender, no hepatomegaly, protuberant abdomen  Ext: 1+ edema bilaterally Musculoskeletal:  No deformities, BUE and BLE strength normal and equal Skin: warm and dry  Neuro:  CNs 2-12 intact, no focal abnormalities noted Psych:  Normal affect   EKG:  The EKG was personally reviewed and demonstrates:  afib vs aflutter with 2:1 AVB  Telemetry:  Telemetry was personally reviewed and demonstrates:  afib vs AFL  Relevant CV Studies: none  Laboratory Data:  Chemistry Recent Labs  Lab 09/05/20 1501  NA 137  K 4.0  CL 103  CO2 26  GLUCOSE 102*  BUN 25*  CREATININE 1.80*  CALCIUM 8.6*  GFRNONAA 39*  ANIONGAP 8    No results for input(s): PROT, ALBUMIN, AST, ALT, ALKPHOS, BILITOT in the last 168 hours. Hematology Recent Labs  Lab 09/05/20 1501  WBC 7.8  RBC 4.81  HGB 15.0  HCT 46.4  MCV 96.5  MCH 31.2  MCHC 32.3  RDW 14.3  PLT 184   Cardiac EnzymesNo results for input(s): TROPONINI in the last 168 hours. No results for input(s): TROPIPOC in the last 168 hours.  BNP Recent Labs  Lab 09/05/20 1944  BNP 130.4*    DDimer No results for input(s): DDIMER in the last 168 hours.  Radiology/Studies:  DG Chest 1 View  Result Date: 09/05/2020 CLINICAL DATA:  Chest pain EXAM: CHEST  1 VIEW COMPARISON:  April 23, 2020 FINDINGS: Mild elevation of right hemidiaphragm is stable. There is no edema or airspace opacity. Heart is upper normal in size with pulmonary vascularity normal. Patient is status post aortic valve replacement. No evident adenopathy. No pneumothorax. No bone lesions. IMPRESSION: Stable elevation of right hemidiaphragm. No edema or airspace opacity. Stable cardiac silhouette. Status post aortic valve replacement. Electronically Signed   By: Bretta Bang III M.D.   On: 09/05/2020 16:01   CT Head Wo Contrast  Result Date:  09/05/2020 CLINICAL DATA:  Left-sided headache and dizziness EXAM: CT HEAD WITHOUT CONTRAST TECHNIQUE: Contiguous axial images were obtained from the base of the skull through the vertex without intravenous contrast. COMPARISON:  MRI head 04/23/2020 FINDINGS: Brain: Generalized atrophy most prominent the frontal lobes. Enlargement of the frontal horns due to atrophy. Mild patchy white matter hypodensity. Negative for acute infarct, hemorrhage, or mass lesion. Vascular: Negative for hyperdense vessel Skull: Negative Sinuses/Orbits: Mild mucosal edema left maxillary sinus. No orbital mass. Other: None IMPRESSION: Frontal lobe atrophy and mild white matter changes. No acute abnormality. Electronically Signed   By: Marlan Palau M.D.   On: 09/05/2020 18:56    Assessment and Plan:   1. Atrial fib vs AFL with variable AV Block with RVR.  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  This would also explain why he was unable to remain in sinus rhythm when performed synchronized cardioversion.  Given that he is symptomatic and mildly hypotensive with RVR, recommend starting IV amiodarone for attempted rate and rhythm control.  He will not tolerate AV nodal blockade.  Would also hold other antihypertensives.  Would look for other drivers of tachycardia including COVID, extended respiratory viral panel, blood cultures.  Continue anticoagulation.  Check TSH.  If no clear trigger for A. fib versus a flutter, then can consider repeat  attempted cardioversion after he has received some amiodarone.      For questions or updates, please contact [facility] Please consult [email or web address] for contact info under     Signed, Joellen Jerseyennis Narcisse Jr, MD  09/05/2020 10:46 PM

## 2020-09-05 NOTE — ED Notes (Signed)
MD aware of SBP in 80s. Orders received for NS bolus.

## 2020-09-05 NOTE — ED Provider Notes (Signed)
Emergency Medicine Provider Triage Evaluation Note  Jorge [name] , a 75 y.o. male  was evaluated in triage.  Pt complains of CP.  Review of Systems  Positive: Cp, cough, chest congestion, headache, sob Negative: Fever, n/v/d, burning on urination  Physical Exam  BP (!) 108/50 (BP Location: Right Arm)   Pulse 67   Resp 18   SpO2 97%  Gen:   Awake, no distress   Resp:  Normal effort  MSK:   Moves extremities without difficulty  Other:    Medical Decision Making  Medically screening exam initiated at 2:54 PM.  Appropriate orders placed.  Jorge [name] was informed that the remainder of the evaluation will be completed by another provider, this initial triage assessment does not replace that evaluation, and the importance of remaining in the ED until their evaluation is complete.  Recurrent midsternal CP x 2-3 days.  Notice some discomfort with urinating without burning on urination.  Occasional cough.    Fayrene Helper, PA-C 09/05/20 1459    Milagros Loll, MD 09/06/20 [phone]

## 2020-09-05 NOTE — ED Notes (Signed)
Coughing more now .  The pt has no pain  Still drowsy  Still in af
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
## 2020-09-05 NOTE — ED Notes (Signed)
Cardiology at  The bedside 

## 2020-09-05 NOTE — Sedation Documentation (Signed)
Cardioverted for the second time  Still in af

## 2020-09-06 DIAGNOSIS — I10 Essential (primary) hypertension: Secondary | ICD-10-CM

## 2020-09-06 DIAGNOSIS — U071 COVID-19: Secondary | ICD-10-CM

## 2020-09-06 DIAGNOSIS — N1832 Chronic kidney disease, stage 3b: Secondary | ICD-10-CM

## 2020-09-06 LAB — BASIC METABOLIC PANEL
Anion gap: 14 (ref 5–15)
BUN: 23 mg/dL (ref 8–23)
CO2: 20 mmol/L — ABNORMAL LOW (ref 22–32)
Calcium: 8.2 mg/dL — ABNORMAL LOW (ref 8.9–10.3)
Chloride: 103 mmol/L (ref 98–111)
Creatinine, Ser: 1.5 mg/dL — ABNORMAL HIGH (ref 0.61–1.24)
GFR, Estimated: 48 mL/min — ABNORMAL LOW (ref >60)
Glucose, Bld: 203 mg/dL — ABNORMAL HIGH (ref 70–99)
Potassium: 4.3 mmol/L (ref 3.5–5.1)
Sodium: 137 mmol/L (ref 135–145)

## 2020-09-06 LAB — RESP PANEL BY RT-PCR (FLU A&B, COVID) ARPGX2
Influenza A by PCR: NEGATIVE
Influenza B by PCR: NEGATIVE
SARS Coronavirus 2 by RT PCR: POSITIVE — AB

## 2020-09-06 LAB — CBC
HCT: 45.2 % (ref 39.0–52.0)
Hemoglobin: 14.8 g/dL (ref 13.0–17.0)
MCH: 31.2 pg (ref 26.0–34.0)
MCHC: 32.7 g/dL (ref 30.0–36.0)
MCV: 95.4 fL (ref 80.0–100.0)
Platelets: 187 10*3/uL (ref 150–400)
RBC: 4.74 MIL/uL (ref 4.22–5.81)
RDW: 14.1 % (ref 11.5–15.5)
WBC: 7 10*3/uL (ref 4.0–10.5)
nRBC: 0 % (ref 0.0–0.2)

## 2020-09-06 LAB — LIPID PANEL
Cholesterol: 102 mg/dL (ref 0–200)
HDL: 37 mg/dL — ABNORMAL LOW (ref >40)
LDL Cholesterol: 52 mg/dL (ref 0–99)
Total CHOL/HDL Ratio: 2.8 RATIO
Triglycerides: 67 mg/dL (ref <150)
VLDL: 13 mg/dL (ref 0–40)

## 2020-09-06 MED ORDER — ACETAMINOPHEN 325 MG PO TABS
650.0000 mg | ORAL_TABLET | ORAL | Status: DC | PRN
  Administered 2020-09-07 (×2): 650 mg via ORAL
  Filled 2020-09-06 (×2): qty 2

## 2020-09-06 MED ORDER — MENTHOL 3 MG MT LOZG
1.0000 | LOZENGE | OROMUCOSAL | Status: DC | PRN
  Administered 2020-09-06 – 2020-09-09 (×6): 3 mg via ORAL
  Filled 2020-09-06 (×4): qty 9

## 2020-09-06 MED ORDER — ALBUTEROL SULFATE HFA 108 (90 BASE) MCG/ACT IN AERS
2.0000 | INHALATION_SPRAY | Freq: Once | RESPIRATORY_TRACT | Status: DC | PRN
  Filled 2020-09-06: qty 6.7

## 2020-09-06 MED ORDER — LEVOTHYROXINE SODIUM 50 MCG PO TABS
50.0000 ug | ORAL_TABLET | Freq: Every day | ORAL | Status: DC
  Administered 2020-09-07 – 2020-09-13 (×7): 50 ug via ORAL
  Filled 2020-09-06 (×7): qty 1

## 2020-09-06 MED ORDER — DIPHENHYDRAMINE HCL 50 MG/ML IJ SOLN: 50.0000 mg | Freq: Once | INTRAMUSCULAR | Status: DC | PRN

## 2020-09-06 MED ORDER — FAMOTIDINE IN NACL 20-0.9 MG/50ML-% IV SOLN
20.0000 mg | Freq: Once | INTRAVENOUS | Status: DC | PRN
  Filled 2020-09-06: qty 50

## 2020-09-06 MED ORDER — METHYLPREDNISOLONE SODIUM SUCC 125 MG IJ SOLR: 125.0000 mg | Freq: Once | INTRAMUSCULAR | Status: DC | PRN

## 2020-09-06 MED ORDER — EPINEPHRINE 0.3 MG/0.3ML IJ SOAJ
0.3000 mg | Freq: Once | INTRAMUSCULAR | Status: DC | PRN
  Filled 2020-09-06: qty 0.3

## 2020-09-06 MED ORDER — APIXABAN 5 MG PO TABS
5.0000 mg | ORAL_TABLET | Freq: Two times a day (BID) | ORAL | Status: DC
  Administered 2020-09-06 – 2020-09-13 (×15): 5 mg via ORAL
  Filled 2020-09-06 (×15): qty 1

## 2020-09-06 MED ORDER — ATORVASTATIN CALCIUM 40 MG PO TABS
40.0000 mg | ORAL_TABLET | Freq: Every day | ORAL | Status: DC
  Administered 2020-09-06 – 2020-09-13 (×8): 40 mg via ORAL
  Filled 2020-09-06 (×8): qty 1

## 2020-09-06 MED ORDER — BEBTELOVIMAB 175 MG/2 ML IV (EUA)
175.0000 mg | Freq: Once | INTRAMUSCULAR | Status: AC
  Administered 2020-09-06: 175 mg via INTRAVENOUS
  Filled 2020-09-06: qty 2

## 2020-09-06 MED ORDER — ONDANSETRON HCL 4 MG/2ML IJ SOLN: 4.0000 mg | Freq: Four times a day (QID) | INTRAMUSCULAR | Status: DC | PRN

## 2020-09-06 MED ORDER — SODIUM CHLORIDE 0.9 % IV SOLN: INTRAVENOUS | Status: AC | PRN

## 2020-09-06 MED ORDER — ASPIRIN 81 MG PO CHEW
81.0000 mg | CHEWABLE_TABLET | Freq: Every day | ORAL | Status: DC
  Administered 2020-09-06 – 2020-09-13 (×8): 81 mg via ORAL
  Filled 2020-09-06 (×8): qty 1

## 2020-09-06 NOTE — Progress Notes (Signed)
   09/06/20 0408  Assess: MEWS Score  Temp 98.8 F (37.1 C)  BP 95/79  Pulse Rate 95  ECG Heart Rate (!) 106  Resp 16  SpO2 96 %  O2 Device Nasal Cannula  O2 Flow Rate (L/min) 3 L/min  Assess: MEWS Score  MEWS Temp 0  MEWS Systolic 1  MEWS Pulse 1  MEWS RR 0  MEWS LOC 0  MEWS Score 2  MEWS Score Color Yellow  Assess: if the MEWS score is Yellow or Red  Were vital signs taken at a resting state? Yes  Focused Assessment No change from prior assessment  Early Detection of Sepsis Score *See Row Information* Low  MEWS guidelines implemented *See Row Information* Yes  Treat  Pain Scale 0-10  Pain Score 0  Take Vital Signs  Increase Vital Sign Frequency  Yellow: Q 2hr X 2 then Q 4hr X 2, if remains yellow, continue Q 4hrs  Escalate  MEWS: Escalate Yellow: discuss with charge nurse/RN and consider discussing with provider and RRT  Notify: Charge Nurse/RN  Name of Charge Nurse/RN Notified Shanell, RN  Date Charge Nurse/RN Notified 09/06/20  Time Charge Nurse/RN Notified [phone]  Document  Patient Outcome Other (Comment) (On Amio gtt, admitted for Afib RVR)  Progress note created (see row info) Yes

## 2020-09-06 NOTE — ED Notes (Signed)
C.o being hot  Bed linens changed pt reports  Intermittent chest pains

## 2020-09-06 NOTE — Progress Notes (Signed)
Progress Note  Patient Name: Jorge [name] Date of Encounter: 09/06/2020  [facility] Cardiologist: Charlton Haws, MD  Subjective   Mild dyspnea; chest pain only with cough  Inpatient Medications    Scheduled Meds: . metoprolol tartrate  5 mg Intravenous Once   Continuous Infusions: . amiodarone 30 mg/hr (09/06/20 0653)   PRN Meds: acetaminophen, ondansetron (ZOFRAN) IV   Vital Signs    Vitals:   09/06/20 0408 09/06/20 0435 09/06/20 0628 09/06/20 0700  BP: 95/79 92/75 93/67  (!) 89/70  Pulse: 95 (!) 102 (!) 111 (!) 140  Resp: 16 17 17 17   Temp: 98.8 F (37.1 C)  98 F (36.7 C) 97.9 F (36.6 C)  TempSrc: Tympanic  Oral Oral  SpO2: 96% 97% 97% 96%  Weight: (!) 140.7 kg     Height: 6\' 1"  (1.854 m)       Intake/Output Summary (Last 24 hours) at 09/06/2020 0851 Last data filed at 09/06/2020 0653 Gross per 24 hour  Intake 1734.22 ml  Output 700 ml  Net 1034.22 ml   Last 3 Weights 09/06/2020 05/22/2020 04/23/2020  Weight (lbs) 310 lb 3 oz 306 lb 312 lb  Weight (kg) 140.7 kg 138.801 kg 141.522 kg      Telemetry    Atrial fibrillation with elevated rate- Personally Reviewed  Physical Exam   GEN: No acute distress.   Neck: No JVD Cardiac:  Irregular and tachycardic Respiratory: Clear to auscultation bilaterally. GI: Soft, nontender, non-distended  MS: No edema Neuro:  Nonfocal  Psych: Normal affect   Labs    High Sensitivity Troponin:   Recent Labs  Lab 09/05/20 1501 09/05/20 1658  TROPONINIHS 23* 21*      Chemistry Recent Labs  Lab 09/05/20 1501 09/06/20 0504  NA 137 137  K 4.0 4.3  CL 103 103  CO2 26 20*  GLUCOSE 102* 203*  BUN 25* 23  CREATININE 1.80* 1.50*  CALCIUM 8.6* 8.2*  GFRNONAA 39* 48*  ANIONGAP 8 14     Hematology Recent Labs  Lab 09/05/20 1501 09/06/20 0504  WBC 7.8 7.0  RBC 4.81 4.74  HGB 15.0 14.8  HCT 46.4 45.2  MCV 96.5 95.4  MCH 31.2 31.2  MCHC 32.3 32.7  RDW 14.3 14.1  PLT 184 187    BNP Recent Labs   Lab 09/05/20 1944  BNP 130.4*     Radiology    DG Chest 1 View  Result Date: 09/05/2020 CLINICAL DATA:  Chest pain EXAM: CHEST  1 VIEW COMPARISON:  April 23, 2020 FINDINGS: Mild elevation of right hemidiaphragm is stable. There is no edema or airspace opacity. Heart is upper normal in size with pulmonary vascularity normal. Patient is status post aortic valve replacement. No evident adenopathy. No pneumothorax. No bone lesions. IMPRESSION: Stable elevation of right hemidiaphragm. No edema or airspace opacity. Stable cardiac silhouette. Status post aortic valve replacement. Electronically Signed   By: 11/05/20 III M.D.   On: 09/05/2020 16:01   CT Head Wo Contrast  Result Date: 09/05/2020 CLINICAL DATA:  Left-sided headache and dizziness EXAM: CT HEAD WITHOUT CONTRAST TECHNIQUE: Contiguous axial images were obtained from the base of the skull through the vertex without intravenous contrast. COMPARISON:  MRI head 04/23/2020 FINDINGS: Brain: Generalized atrophy most prominent the frontal lobes. Enlargement of the frontal horns due to atrophy. Mild patchy white matter hypodensity. Negative for acute infarct, hemorrhage, or mass lesion. Vascular: Negative for hyperdense vessel Skull: Negative Sinuses/Orbits: Mild mucosal edema left maxillary sinus. No  orbital mass. Other: None IMPRESSION: Frontal lobe atrophy and mild white matter changes. No acute abnormality. Electronically Signed   By: Marlan Palau M.D.   On: 09/05/2020 18:56    Patient Profile     75 y.o. male with past medical history of paroxysmal atrial fibrillation, hypertension, prior TAVR, peripheral vascular disease, chronic stage III kidney disease, chronic diastolic congestive heart failure with atrial fibrillation.  Patient was cardioverted in the emergency room due to hypotension.  However atrial fibrillation recurred.  Also found to be COVID-positive.  Echocardiogram January 2022 showed normal LV function, grade 2 diastolic  dysfunction, mild left atrial enlargement, status post TAVR with mean gradient 13 mmHg and no aortic insufficiency.  Assessment & Plan    1 paroxysmal atrial fibrillation-patient remains in atrial fibrillation this morning.  He underwent cardioversion twice in the emergency room resulting in sinus rhythm but atrial fibrillation recurred.  Continue IV amiodarone as blood pressure is borderline.  Continue apixaban.  Atrial fibrillation may be driven by COVID infection.  Echocardiogram January 2022 showed normal LV function, grade 2 diastolic dysfunction, mild left atrial enlargement, status post TAVR with mean gradient 13 mmHg and no aortic insufficiency.  2 history of TAVR-most recent echocardiogram showed normally functioning valve.  3 coronary artery disease-aspirin.  Resume Lipitor 40 mg daily.  4 hypertension-blood pressure is borderline.  Hold Lasix, lisinopril and metoprolol for now.  Follow blood pressure.  5 hyperlipidemia-continue statin.  6 previous TCAR-resume ASA.  For questions or updates, please contact [facility] Please consult [email or web address] for contact info under        Signed, Olga Millers, MD  09/06/2020, 8:51 AM  '

## 2020-09-06 NOTE — Progress Notes (Signed)
PROGRESS NOTE    Jorge [name]  WHQ:[id number] DOB: November 12, 1945 DOA: 09/05/2020 PCP: [name], PA-C    Chief Complaint  Patient presents with  . Chest Pain    Brief Narrative:   Jorge [name] is a 75 y.o. male with medical history significant ofparoxysmal atrial fibrillation, chronic kidney disease stage III, diastolic heart failure, peripheral vascular disease, essential hypertension, history of due to aortic valve replacement, anxiety disorder, history of asthma who presented to the ER initially with 3 days of shortness of breath chest discomfort as well as weakness. In the ER patient was found to be in atrial fibrillation with rapid ventricular response, cardioversion not succesful. He was also found to be COVID positive, .   Assessment & Plan:   Principal Problem:   Rapid atrial fibrillation (HCC) Active Problems:   Aortic stenosis   CAD (coronary artery disease)   HLD (hyperlipidemia)   Essential hypertension   History of cerebrovascular accident (CVA) with residual deficit   OSA on CPAP chronic diastolic congestive heart failure (HCC)   Carotid artery stenosis   CKD (chronic kidney disease) stage 3, GFR 30-59 ml/min (HCC)   COVID-19 virus infection   COVID 19 Virus infection: Currently on 2 lit of [place] oxygen put in for comfort as per RN. Sats were in upper 90's on 2 lit, plan to wean his oxygen in the next 24 hours.  Pt reports he is unable to cough.  No chest pain.  Monoclonal antibodies were ordered.    Atrial fib with RVR  Failed cardioversion in ED.  Converted to sinus today with IV amiodarone.  Continue with aspirin,  And on eliquis for anti coagulation.  Cardiology on board.  Echocardiogram from 1/22 showed preserved LVEF, without any regional wall abn, left ventricular diastolic parameters are consistent with grade II diastolic dysfunction.   Chronic diastolic heart failure:  He appears to be compensated. CXR shows No edema or airspace opacity.  Stable cardiac silhouette. Status post aortic valve replacement. Continue with strict intake and output.    H/o CVA , carotid artery stenosis:  S/p redo transcatheter placement of left carotid stent with angioplasty.  Continue with aspirin and eliquis as per neurology recommendations.    Stage 3b CKD:  Monitor renal parameters.    H/o TAVR;  Cardiology on board. Outpatient follow up.   Body mass index is 40.92 kg/m. Obesity:  Outpatient follow up with PCP.    Essential hypertension  BP parameters are optimal.    OSA  On CPAP At home.   Hyperglycemia:  ? From solumedrol, check A1c    DVT prophylaxis: Eliquis Code Status:  DNR Family Communication: NONE AT BEDSIDE.  Disposition:   Status is: Inpatient  Remains inpatient appropriate because:Ongoing diagnostic testing needed not appropriate for outpatient work up, IV treatments appropriate due to intensity of illness or inability to take PO and Inpatient level of care appropriate due to severity of illness   Dispo: The patient is from: Home              Anticipated d/c is to: pending.               Patient currently is not medically stable to d/c.   Difficult to place patient No       Consultants:   cardiology  Procedures: none  Antimicrobials:  Antibiotics Given (last 72 hours)    None      Subjective: Breathing improved.   Objective: Vitals:   09/06/20 1100  09/06/20 1105 09/06/20 1238 09/06/20 1500  BP: (!) 110/51 (!) 110/51 98/64 134/60  Pulse: 99  73 77  Resp: (!) 21 20 15 14   Temp: 98.2 F (36.8 C)   97.9 F (36.6 C)  TempSrc: Oral   Oral  SpO2: 93%  97% 93%  Weight:      Height:        Intake/Output Summary (Last 24 hours) at 09/06/2020 1611 Last data filed at 09/06/2020 1501 Gross per 24 hour  Intake 2318.54 ml  Output 700 ml  Net 1618.54 ml   Filed Weights   09/06/20 0408  Weight: (!) 140.7 kg    Examination:  General exam: Appears calm and comfortable  Respiratory  system: Clear to auscultation. Respiratory effort normal. Cardiovascular system: S1 & S2 heard, RRR. No JVD, murmurs, rubs, gallops or clicks. No pedal edema. Gastrointestinal system: Abdomen is nondistended, soft and nontender. No organomegaly or masses felt. Normal bowel sounds heard. Central nervous system: Alert and oriented. No focal neurological deficits. Extremities: Symmetric 5 x 5 power. Skin: No rashes, lesions or ulcers Psychiatry:  Mood & affect appropriate.     Data Reviewed: I have personally reviewed following labs and imaging studies  CBC: Recent Labs  Lab 09/05/20 1501 09/06/20 0504  WBC 7.8 7.0  HGB 15.0 14.8  HCT 46.4 45.2  MCV 96.5 95.4  PLT 184 187    Basic Metabolic Panel: Recent Labs  Lab 09/05/20 1501 09/05/20 1944 09/06/20 0504  NA 137  --  137  K 4.0  --  4.3  CL 103  --  103  CO2 26  --  20*  GLUCOSE 102*  --  203*  BUN 25*  --  23  CREATININE 1.80*  --  1.50*  CALCIUM 8.6*  --  8.2*  MG  --  2.3  --     GFR: Estimated Creatinine Clearance: 62.7 mL/min (A) (by C-G formula based on SCr of 1.5 mg/dL (H)).  Liver Function Tests: No results for input(s): AST, ALT, ALKPHOS, BILITOT, PROT, ALBUMIN in the last 168 hours.  CBG: No results for input(s): GLUCAP in the last 168 hours.   Recent Results (from the past 240 hour(s))  Resp Panel by RT-PCR (Flu A&B, Covid) Nasopharyngeal Swab     Status: Abnormal   Collection Time: 09/05/20 10:46 PM   Specimen: Nasopharyngeal Swab; Nasopharyngeal(NP) swabs in vial transport medium  Result Value Ref Range Status   SARS Coronavirus 2 by RT PCR POSITIVE (A) NEGATIVE Final    Comment: RESULT CALLED TO, READ BACK BY AND VERIFIED WITH: RN CHRIS K. 11/05/20 0030 FCP (NOTE) SARS-CoV-2 target nucleic acids are DETECTED.  The SARS-CoV-2 RNA is generally detectable in upper respiratory specimens during the acute phase of infection. Positive results are indicative of the presence of the identified virus,  but do not rule out bacterial infection or co-infection with other pathogens not detected by the test. Clinical correlation with patient history and other diagnostic information is necessary to determine patient infection status. The expected result is Negative.  Fact Sheet for Patients: E9598085  Fact Sheet for Healthcare Providers: BloggerCourse.com  This test is not yet approved or cleared by the [place] FDA and  has been authorized for detection and/or diagnosis of SARS-CoV-2 by FDA under an Emergency Use Authorization (EUA).  This EUA will remain in effect (meaning this test can be used)  for the duration of  the COVID-19 declaration under Section 564(b)(1) of the Act, 21 U.S.C. section  360bbb-3(b)(1), unless the authorization is terminated or revoked sooner.     Influenza A by PCR NEGATIVE NEGATIVE Final   Influenza B by PCR NEGATIVE NEGATIVE Final    Comment: (NOTE) The Xpert Xpress SARS-CoV-2/FLU/RSV plus assay is intended as an aid in the diagnosis of influenza from Nasopharyngeal swab specimens and should not be used as a sole basis for treatment. Nasal washings and aspirates are unacceptable for Xpert Xpress SARS-CoV-2/FLU/RSV testing.  Fact Sheet for Patients: BloggerCourse.com  Fact Sheet for Healthcare Providers: SeriousBroker.it  This test is not yet approved or cleared by the [place] FDA and has been authorized for detection and/or diagnosis of SARS-CoV-2 by FDA under an Emergency Use Authorization (EUA). This EUA will remain in effect (meaning this test can be used) for the duration of the COVID-19 declaration under Section 564(b)(1) of the Act, 21 U.S.C. section 360bbb-3(b)(1), unless the authorization is terminated or revoked.  Performed at [facility], [address] [place], [place] [address]          Radiology  Studies: DG Chest 1 View  Result Date: 09/05/2020 CLINICAL DATA:  Chest pain EXAM: CHEST  1 VIEW COMPARISON:  April 23, 2020 FINDINGS: Mild elevation of right hemidiaphragm is stable. There is no edema or airspace opacity. Heart is upper normal in size with pulmonary vascularity normal. Patient is status post aortic valve replacement. No evident adenopathy. No pneumothorax. No bone lesions. IMPRESSION: Stable elevation of right hemidiaphragm. No edema or airspace opacity. Stable cardiac silhouette. Status post aortic valve replacement. Electronically Signed   By: Bretta Bang III M.D.   On: 09/05/2020 16:01   CT Head Wo Contrast  Result Date: 09/05/2020 CLINICAL DATA:  Left-sided headache and dizziness EXAM: CT HEAD WITHOUT CONTRAST TECHNIQUE: Contiguous axial images were obtained from the base of the skull through the vertex without intravenous contrast. COMPARISON:  MRI head 04/23/2020 FINDINGS: Brain: Generalized atrophy most prominent the frontal lobes. Enlargement of the frontal horns due to atrophy. Mild patchy white matter hypodensity. Negative for acute infarct, hemorrhage, or mass lesion. Vascular: Negative for hyperdense vessel Skull: Negative Sinuses/Orbits: Mild mucosal edema left maxillary sinus. No orbital mass. Other: None IMPRESSION: Frontal lobe atrophy and mild white matter changes. No acute abnormality. Electronically Signed   By: Marlan Palau M.D.   On: 09/05/2020 18:56        Scheduled Meds: . apixaban  5 mg Oral BID  . aspirin  81 mg Oral Daily  . atorvastatin  40 mg Oral Daily  . metoprolol tartrate  5 mg Intravenous Once   Continuous Infusions: . amiodarone 30 mg/hr (09/06/20 1600)     LOS: 1 day        Kathlen Mody, MD [facility]   To contact the attending provider between 7A-7P or the covering provider during after hours 7P-7A, please log into the web site [email or web address] and access using universal [place] password for that web site. If  you do not have the password, please call the hospital operator.  09/06/2020, 4:11 PM

## 2020-09-06 NOTE — ED Notes (Signed)
Report called to [facility] rn on 6e

## 2020-09-07 MED ORDER — LISINOPRIL 10 MG PO TABS
10.0000 mg | ORAL_TABLET | Freq: Every day | ORAL | Status: DC
  Administered 2020-09-07 – 2020-09-11 (×4): 10 mg via ORAL
  Filled 2020-09-07 (×4): qty 1

## 2020-09-07 MED ORDER — LORATADINE 10 MG PO TABS
10.0000 mg | ORAL_TABLET | Freq: Every day | ORAL | Status: DC
  Administered 2020-09-07 – 2020-09-13 (×7): 10 mg via ORAL
  Filled 2020-09-07 (×7): qty 1

## 2020-09-07 MED ORDER — NAPHAZOLINE-GLYCERIN 0.012-0.2 % OP SOLN: 1.0000 [drp] | Freq: Four times a day (QID) | OPHTHALMIC | Status: DC | PRN

## 2020-09-07 MED ORDER — GUAIFENESIN-DM 100-10 MG/5ML PO SYRP
5.0000 mL | ORAL_SOLUTION | ORAL | Status: DC | PRN
  Administered 2020-09-07 – 2020-09-08 (×2): 5 mL via ORAL
  Filled 2020-09-07 (×2): qty 5

## 2020-09-07 MED ORDER — METOPROLOL TARTRATE 12.5 MG HALF TABLET
12.5000 mg | ORAL_TABLET | Freq: Two times a day (BID) | ORAL | Status: DC
  Administered 2020-09-07 – 2020-09-13 (×12): 12.5 mg via ORAL
  Filled 2020-09-07 (×12): qty 1

## 2020-09-07 MED ORDER — TRAMADOL HCL 50 MG PO TABS
50.0000 mg | ORAL_TABLET | Freq: Four times a day (QID) | ORAL | Status: DC | PRN
  Administered 2020-09-07 – 2020-09-09 (×4): 50 mg via ORAL
  Filled 2020-09-07 (×6): qty 1

## 2020-09-07 MED ORDER — POLYVINYL ALCOHOL 1.4 % OP SOLN
1.0000 [drp] | Freq: Four times a day (QID) | OPHTHALMIC | Status: DC | PRN
  Administered 2020-09-08 – 2020-09-09 (×3): 1 [drp] via OPHTHALMIC
  Filled 2020-09-07: qty 15

## 2020-09-07 NOTE — Evaluation (Signed)
Physical Therapy Evaluation Patient Details Name: Jorge [name] MRN: [id number] DOB: 24-Aug-1945 Today's Date: 09/07/2020   History of Present Illness  75 y.o. male presenting to ER on 09/05/2020 initally with 3 days of SOB, chest discomfort, and weakness. In the ER, pt found to be in a fib with RVR. While waiting for admission pt is COVID positive, likely triggering a fib with RVR. PMH includes paroxysmal atrial fibrillation, chronic kidney disease stage III, diastolic heart failure, peripheral vascular disease, essential hypertension, history of due to aortic valve replacement, anxiety disorder, history of asthma.  Clinical Impression  Pt presents to PT with deficits in activity tolerance, gait, balance, power. Pt's greatest deficits is activity tolerance at this time, reporting fatigue with mobility compared to baseline. Pt also demonstrates deficits in gait and balance, however pt reports these to be more chronic in nature from prior CVA and back injuries. Pt will benefit from acute PT POC to improve activity tolerance and to further challenge gait and balance. PT recommends discharge home with HHPT.    Follow Up Recommendations Home health PT    Equipment Recommendations  None recommended by PT    Recommendations for Other Services       Precautions / Restrictions Precautions Precautions: Fall Restrictions Weight Bearing Restrictions: No      Mobility  Bed Mobility Overal bed mobility: Modified Independent             General bed mobility comments: increased time, HOB elevated, pt owns adjustable bed    Transfers Overall transfer level: Needs assistance Equipment used: Straight cane Transfers: Sit to/from Stand Sit to Stand: Supervision            Ambulation/Gait Ambulation/Gait assistance: Supervision Gait Distance (Feet): 60 Feet Assistive device: Straight cane Gait Pattern/deviations: Step-to pattern Gait velocity: reduced Gait velocity interpretation:  <1.31 ft/sec, indicative of household ambulator General Gait Details: pt with slowed step-to gait  reduced step length and gait speed  Stairs            Wheelchair Mobility    Modified Rankin (Stroke Patients Only)       Balance Overall balance assessment: Needs assistance Sitting-balance support: No upper extremity supported;Feet supported Sitting balance-Leahy Scale: Good     Standing balance support: Single extremity supported Standing balance-Leahy Scale: Poor Standing balance comment: reliant on unilateral UE support of cane                             Pertinent Vitals/Pain Pain Assessment: Faces Faces Pain Scale: Hurts a little bit Pain Location: chest with deep breathing Pain Descriptors / Indicators: Grimacing Pain Intervention(s): Monitored during session    Home Living Family/patient expects to be discharged to:: Private residence Living Arrangements: Alone Available Help at Discharge: Family;Available PRN/intermittently (son available via phone) Type of Home: Apartment Home Access: Ramped entrance     Home Layout: One level Home Equipment: Cane - single point;Shower seat;Grab bars - toilet;Grab bars - tub/shower;Hand held shower head;Wheelchair - IT trainer;Other (comment)      Prior Function Level of Independence: Independent with assistive device(s)         Comments: pt ambulates in the home independently with intermittent UE support of furniture/walls. Pt utilizes cane for community obility indoors and utilizes a power wheelchair for outdoor mobility     Hand Dominance   Dominant Hand: Left    Extremity/Trunk Assessment   Upper Extremity Assessment Upper Extremity Assessment: Overall WFL for  tasks assessed    Lower Extremity Assessment Lower Extremity Assessment: Generalized weakness (pt reports LLE weakness from prior CVA)    Cervical / Trunk Assessment Cervical / Trunk Assessment: Normal  Communication    Communication: No difficulties  Cognition Arousal/Alertness: Awake/alert Behavior During Therapy: WFL for tasks assessed/performed Overall Cognitive Status: Within Functional Limits for tasks assessed                                        General Comments General comments (skin integrity, edema, etc.): pt tachy up to 125 with mobility, reports fatigue but sats stable on RA. HR recovers well when resting    Exercises     Assessment/Plan    PT Assessment Patient needs continued PT services  PT Problem List Decreased strength;Decreased activity tolerance;Decreased balance;Decreased mobility;Cardiopulmonary status limiting activity       PT Treatment Interventions DME instruction;Gait training;Functional mobility training;Therapeutic activities;Balance training;Therapeutic exercise;Patient/family education    PT Goals (Current goals can be found in the Care Plan section)  Acute Rehab PT Goals Patient Stated Goal: to go home and return to baseline PT Goal Formulation: With patient Time For Goal Achievement:  (t+14) Potential to Achieve Goals: Good    Frequency Min 3X/week   Barriers to discharge        Co-evaluation               AM-PAC PT "6 Clicks" Mobility  Outcome Measure Help needed turning from your back to your side while in a flat bed without using bedrails?: None Help needed moving from lying on your back to sitting on the side of a flat bed without using bedrails?: None Help needed moving to and from a bed to a chair (including a wheelchair)?: A Little Help needed standing up from a chair using your arms (e.g., wheelchair or bedside chair)?: A Little Help needed to walk in hospital room?: A Little Help needed climbing 3-5 steps with a railing? : A Lot 6 Click Score: 19    End of Session   Activity Tolerance: Patient tolerated treatment well Patient left: in chair;with call bell/phone within reach;with chair alarm set Nurse Communication:  Mobility status PT Visit Diagnosis: Unsteadiness on feet (R26.81)    Time: 8502-7741 PT Time Calculation (min) (ACUTE ONLY): 33 min   Charges:   PT Evaluation $PT Eval Low Complexity: 1 Low PT Treatments $Therapeutic Activity: 8-22 mins        Arlyss Gandy, PT, DPT Acute Rehabilitation Pager: [phone]   Arlyss Gandy 09/07/2020, 12:00 PM

## 2020-09-07 NOTE — Progress Notes (Signed)
PROGRESS NOTE    NAVY BELAY  IOX:[id number] DOB: 05-14-1945 DOA: 09/05/2020 PCP: [name], PA-C    Chief Complaint  Patient presents with  . Chest Pain    Brief Narrative:   Jorge [name] is a 75 y.o. male with medical history significant ofparoxysmal atrial fibrillation, chronic kidney disease stage III, diastolic heart failure, peripheral vascular disease, essential hypertension, history of due to aortic valve replacement, anxiety disorder, history of asthma who presented to the ER initially with 3 days of shortness of breath chest discomfort as well as weakness. In the ER patient was found to be in atrial fibrillation with rapid ventricular response, cardioversion not succesful. He was also found to be COVID positive, .   Assessment & Plan:   Principal Problem:   Rapid atrial fibrillation (HCC) Active Problems:   Aortic stenosis   CAD (coronary artery disease)   HLD (hyperlipidemia)   Essential hypertension   History of cerebrovascular accident (CVA) with residual deficit   OSA on CPAP chronic diastolic congestive heart failure (HCC)   Carotid artery stenosis   CKD (chronic kidney disease) stage 3, GFR 30-59 ml/min (HCC)   COVID-19 virus infection   COVID 19 Virus infection: MAB infusion ordered .  Breathing has improved.  Anti tussives as needed.  [place] oxygen to keep sats greater than 100%   Atrial fib with RVR  Failed cardioversion in ED.  Recurrent atrial fibrillation. Continue with IV amiodarone. If he does not convert , schedule for cardioversion.  Continue with aspirin,  And on eliquis for anti coagulation.  Cardiology on board.  Echocardiogram from 1/22 showed preserved LVEF, without any regional wall abn, left ventricular diastolic parameters are consistent with grade II diastolic dysfunction.   Chronic diastolic heart failure:  He appears to be compensated. CXR shows No edema or airspace opacity. Stable cardiac silhouette. Status post aortic  valve replacement. Continue with strict intake and output.    H/o CVA , carotid artery stenosis:  S/p redo transcatheter placement of left carotid stent with angioplasty.  Continue with aspirin and eliquis as per neurology recommendations.    Stage 3b CKD:  Monitor renal parameters.    H/o TAVR;  Cardiology on board. Outpatient follow up.   Body mass index is 40.92 kg/m. Obesity:  Outpatient follow up with PCP.    Essential hypertension  BP parameters are optimal. Continue with lisinopril, metoprolol and lasix.    OSA  On CPAP At home.   Hyperglycemia:  ? From solumedrol, check A1c    Hypothyroidism:  Resume synthroid.    DVT prophylaxis: Eliquis Code Status:  DNR Family Communication: NONE AT BEDSIDE.  Disposition:   Status is: Inpatient  Remains inpatient appropriate because:Ongoing diagnostic testing needed not appropriate for outpatient work up, IV treatments appropriate due to intensity of illness or inability to take PO and Inpatient level of care appropriate due to severity of illness   Dispo: The patient is from: Home              Anticipated d/c is to: pending.               Patient currently is not medically stable to d/c.   Difficult to place patient No       Consultants:   cardiology  Procedures: none  Antimicrobials:  Antibiotics Given (last 72 hours)    None      Subjective: Breathing has improved. No chest pain today.  Objective: Vitals:   09/06/20 2033 09/06/20 2334  09/07/20 0433 09/07/20 0700  BP: 133/63 (!) 127/57 (!) 145/67 139/72  Pulse: 65 67 64   Resp: 16 17 20    Temp: 98 F (36.7 C) 98 F (36.7 C) 97.8 F (36.6 C) 97.8 F (36.6 C)  TempSrc: Oral Oral Oral Oral  SpO2: 95% 92% 100%   Weight:      Height:        Intake/Output Summary (Last 24 hours) at 09/07/2020 1448 Last data filed at 09/07/2020 0900 Gross per 24 hour  Intake 1277.35 ml  Output 850 ml  Net 427.35 ml   Filed Weights   09/06/20 0408   Weight: (!) 140.7 kg    Examination:  General exam: Alert and comfortable.  Respiratory system: Air entry fair, no wheezing heard,  Cardiovascular system: S1 & S2 heard, irregularly irregular, no JVD no pedal edema.  Gastrointestinal system: Abdomen is soft, non tender non distended bowel sounds good.  Central nervous system: Alert and oriented, non focal Extremities: no cyanosis.  Skin: No rashes seen.  Psychiatry:  Mood is appropriate.     Data Reviewed: I have personally reviewed following labs and imaging studies  CBC: Recent Labs  Lab 09/05/20 1501 09/06/20 0504  WBC 7.8 7.0  HGB 15.0 14.8  HCT 46.4 45.2  MCV 96.5 95.4  PLT 184 187    Basic Metabolic Panel: Recent Labs  Lab 09/05/20 1501 09/05/20 1944 09/06/20 0504  NA 137  --  137  K 4.0  --  4.3  CL 103  --  103  CO2 26  --  20*  GLUCOSE 102*  --  203*  BUN 25*  --  23  CREATININE 1.80*  --  1.50*  CALCIUM 8.6*  --  8.2*  MG  --  2.3  --     GFR: Estimated Creatinine Clearance: 62.7 mL/min (A) (by C-G formula based on SCr of 1.5 mg/dL (H)).  Liver Function Tests: No results for input(s): AST, ALT, ALKPHOS, BILITOT, PROT, ALBUMIN in the last 168 hours.  CBG: No results for input(s): GLUCAP in the last 168 hours.   Recent Results (from the past 240 hour(s))  Resp Panel by RT-PCR (Flu A&B, Covid) Nasopharyngeal Swab     Status: Abnormal   Collection Time: 09/05/20 10:46 PM   Specimen: Nasopharyngeal Swab; Nasopharyngeal(NP) swabs in vial transport medium  Result Value Ref Range Status   SARS Coronavirus 2 by RT PCR POSITIVE (A) NEGATIVE Final    Comment: RESULT CALLED TO, READ BACK BY AND VERIFIED WITH: RN CHRIS K. 11/05/20 0030 FCP (NOTE) SARS-CoV-2 target nucleic acids are DETECTED.  The SARS-CoV-2 RNA is generally detectable in upper respiratory specimens during the acute phase of infection. Positive results are indicative of the presence of the identified virus, but do not rule out  bacterial infection or co-infection with other pathogens not detected by the test. Clinical correlation with patient history and other diagnostic information is necessary to determine patient infection status. The expected result is Negative.  Fact Sheet for Patients: E9598085  Fact Sheet for Healthcare Providers: BloggerCourse.com  This test is not yet approved or cleared by the [place] FDA and  has been authorized for detection and/or diagnosis of SARS-CoV-2 by FDA under an Emergency Use Authorization (EUA).  This EUA will remain in effect (meaning this test can be used)  for the duration of  the COVID-19 declaration under Section 564(b)(1) of the Act, 21 U.S.C. section 360bbb-3(b)(1), unless the authorization is terminated or revoked sooner.  Influenza A by PCR NEGATIVE NEGATIVE Final   Influenza B by PCR NEGATIVE NEGATIVE Final    Comment: (NOTE) The Xpert Xpress SARS-CoV-2/FLU/RSV plus assay is intended as an aid in the diagnosis of influenza from Nasopharyngeal swab specimens and should not be used as a sole basis for treatment. Nasal washings and aspirates are unacceptable for Xpert Xpress SARS-CoV-2/FLU/RSV testing.  Fact Sheet for Patients: BloggerCourse.com  Fact Sheet for Healthcare Providers: SeriousBroker.it  This test is not yet approved or cleared by the [place] FDA and has been authorized for detection and/or diagnosis of SARS-CoV-2 by FDA under an Emergency Use Authorization (EUA). This EUA will remain in effect (meaning this test can be used) for the duration of the COVID-19 declaration under Section 564(b)(1) of the Act, 21 U.S.C. section 360bbb-3(b)(1), unless the authorization is terminated or revoked.  Performed at [facility], [address], [place], [place] [address]          Radiology Studies: DG Chest 1  View  Result Date: 09/05/2020 CLINICAL DATA:  Chest pain EXAM: CHEST  1 VIEW COMPARISON:  April 23, 2020 FINDINGS: Mild elevation of right hemidiaphragm is stable. There is no edema or airspace opacity. Heart is upper normal in size with pulmonary vascularity normal. Patient is status post aortic valve replacement. No evident adenopathy. No pneumothorax. No bone lesions. IMPRESSION: Stable elevation of right hemidiaphragm. No edema or airspace opacity. Stable cardiac silhouette. Status post aortic valve replacement. Electronically Signed   By: Bretta Bang III M.D.   On: 09/05/2020 16:01   CT Head Wo Contrast  Result Date: 09/05/2020 CLINICAL DATA:  Left-sided headache and dizziness EXAM: CT HEAD WITHOUT CONTRAST TECHNIQUE: Contiguous axial images were obtained from the base of the skull through the vertex without intravenous contrast. COMPARISON:  MRI head 04/23/2020 FINDINGS: Brain: Generalized atrophy most prominent the frontal lobes. Enlargement of the frontal horns due to atrophy. Mild patchy white matter hypodensity. Negative for acute infarct, hemorrhage, or mass lesion. Vascular: Negative for hyperdense vessel Skull: Negative Sinuses/Orbits: Mild mucosal edema left maxillary sinus. No orbital mass. Other: None IMPRESSION: Frontal lobe atrophy and mild white matter changes. No acute abnormality. Electronically Signed   By: Marlan Palau M.D.   On: 09/05/2020 18:56        Scheduled Meds: . apixaban  5 mg Oral BID  . aspirin  81 mg Oral Daily  . atorvastatin  40 mg Oral Daily  . levothyroxine  50 mcg Oral Q0600  . lisinopril  10 mg Oral Daily  . metoprolol tartrate  12.5 mg Oral BID   Continuous Infusions: . sodium chloride    . amiodarone 30 mg/hr (09/07/20 0505)     LOS: 2 days        Kathlen Mody, MD [facility]   To contact the attending provider between 7A-7P or the covering provider during after hours 7P-7A, please log into the web site [email or web address] and  access using universal [place] password for that web site. If you do not have the password, please call the hospital operator.  09/07/2020, 2:48 PM

## 2020-09-07 NOTE — Progress Notes (Signed)
Mobility Specialist: Progress Note   09/07/20 1800  Mobility  Activity Ambulated in room  Level of Assistance Modified independent, requires aide device or extra time  Assistive Device [facility] Ambulated (ft) 60 ft  Mobility Ambulated independently in room  Mobility Response Tolerated well  Mobility performed by Mobility specialist  $Mobility charge 1 Mobility   Pt asx during ambulation. Pt was able to stand up on his own using cane. Pt back to bed after walk, RN present in room.   [facility] Han Lysne Mobility Specialist Mobility Specialist Phone: [phone]

## 2020-09-07 NOTE — Progress Notes (Signed)
Progress Note  Patient Name: Jorge [name] Date of Encounter: 09/07/2020  [facility] Cardiologist: Charlton Haws, MD  Subjective   Dyspnea improving.  Chest pain with cough.  Inpatient Medications    Scheduled Meds: . apixaban  5 mg Oral BID  . aspirin  81 mg Oral Daily  . atorvastatin  40 mg Oral Daily  . levothyroxine  50 mcg Oral Q0600  . metoprolol tartrate  5 mg Intravenous Once   Continuous Infusions: . sodium chloride    . amiodarone 30 mg/hr (09/07/20 0505)  . famotidine (PEPCID) IV     PRN Meds: sodium chloride, acetaminophen, albuterol, diphenhydrAMINE, EPINEPHrine, famotidine (PEPCID) IV, menthol-cetylpyridinium, methylPREDNISolone (SOLU-MEDROL) injection, ondansetron (ZOFRAN) IV   Vital Signs    Vitals:   09/06/20 1727 09/06/20 2033 09/06/20 2334 09/07/20 0433  BP:  133/63 (!) 127/57 (!) 145/67  Pulse: 66 65 67 64  Resp: 17 16 17 20   Temp:  98 F (36.7 C) 98 F (36.7 C) 97.8 F (36.6 C)  TempSrc:  Oral Oral Oral  SpO2: 95% 95% 92% 100%  Weight:      Height:        Intake/Output Summary (Last 24 hours) at 09/07/2020 0751 Last data filed at 09/07/2020 0505 Gross per 24 hour  Intake 1457.35 ml  Output 450 ml  Net 1007.35 ml   Last 3 Weights 09/06/2020 05/22/2020 04/23/2020  Weight (lbs) 310 lb 3 oz 306 lb 312 lb  Weight (kg) 140.7 kg 138.801 kg 141.522 kg      Telemetry    Sinus bradycardia converting back to atrial fibrillation.- Personally Reviewed  Physical Exam   GEN: No acute distress. WD WN   Neck: supple Cardiac: irregular Respiratory: CTA GI: Soft, NT, ND  MS: No edema Neuro:  Grossly intact Psych: Normal affect   Labs    High Sensitivity Troponin:   Recent Labs  Lab 09/05/20 1501 09/05/20 1658  TROPONINIHS 23* 21*      Chemistry Recent Labs  Lab 09/05/20 1501 09/06/20 0504  NA 137 137  K 4.0 4.3  CL 103 103  CO2 26 20*  GLUCOSE 102* 203*  BUN 25* 23  CREATININE 1.80* 1.50*  CALCIUM 8.6* 8.2*  GFRNONAA 39*  48*  ANIONGAP 8 14     Hematology Recent Labs  Lab 09/05/20 1501 09/06/20 0504  WBC 7.8 7.0  RBC 4.81 4.74  HGB 15.0 14.8  HCT 46.4 45.2  MCV 96.5 95.4  MCH 31.2 31.2  MCHC 32.3 32.7  RDW 14.3 14.1  PLT 184 187    BNP Recent Labs  Lab 09/05/20 1944  BNP 130.4*     Radiology    DG Chest 1 View  Result Date: 09/05/2020 CLINICAL DATA:  Chest pain EXAM: CHEST  1 VIEW COMPARISON:  April 23, 2020 FINDINGS: Mild elevation of right hemidiaphragm is stable. There is no edema or airspace opacity. Heart is upper normal in size with pulmonary vascularity normal. Patient is status post aortic valve replacement. No evident adenopathy. No pneumothorax. No bone lesions. IMPRESSION: Stable elevation of right hemidiaphragm. No edema or airspace opacity. Stable cardiac silhouette. Status post aortic valve replacement. Electronically Signed   By: April 25, 2020 III M.D.   On: 09/05/2020 16:01   CT Head Wo Contrast  Result Date: 09/05/2020 CLINICAL DATA:  Left-sided headache and dizziness EXAM: CT HEAD WITHOUT CONTRAST TECHNIQUE: Contiguous axial images were obtained from the base of the skull through the vertex without intravenous contrast. COMPARISON:  MRI  head 04/23/2020 FINDINGS: Brain: Generalized atrophy most prominent the frontal lobes. Enlargement of the frontal horns due to atrophy. Mild patchy white matter hypodensity. Negative for acute infarct, hemorrhage, or mass lesion. Vascular: Negative for hyperdense vessel Skull: Negative Sinuses/Orbits: Mild mucosal edema left maxillary sinus. No orbital mass. Other: None IMPRESSION: Frontal lobe atrophy and mild white matter changes. No acute abnormality. Electronically Signed   By: Marlan Palau M.D.   On: 09/05/2020 18:56    Patient Profile     75 y.o. male with past medical history of paroxysmal atrial fibrillation, hypertension, prior TAVR, peripheral vascular disease, chronic stage III kidney disease, chronic diastolic congestive heart  failure with atrial fibrillation.  Patient was cardioverted in the emergency room due to hypotension.  However atrial fibrillation recurred.  Also found to be COVID-positive.  Echocardiogram January 2022 showed normal LV function, grade 2 diastolic dysfunction, mild left atrial enlargement, status post TAVR with mean gradient 13 mmHg and no aortic insufficiency.  Assessment & Plan    1 paroxysmal atrial fibrillation-patient converted transiently to sinus yesterday but is back in atrial fibrillation this morning.  We will continue IV amiodarone.  Continue apixaban.  Atrial fibrillation likely being driven by COVID infection.  If he does not convert we will likely plan cardioversion after he recovers from COVID. Echocardiogram January 2022 showed normal LV function, grade 2 diastolic dysfunction, mild left atrial enlargement, status post TAVR with mean gradient 13 mmHg and no aortic insufficiency.  2 history of TAVR-most recent echocardiogram showed normally functioning valve.  3 coronary artery disease-continue aspirin and statin.  4 hypertension-blood pressure has improved.  Resume lisinopril 10 mg daily and metoprolol 12.5 mg twice daily.  Resume Lasix at discharge.  5 hyperlipidemia-continue statin.  6 previous TCAR-continue aspirin and statin.  For questions or updates, please contact [facility] Please consult [email or web address] for contact info under        Signed, Olga Millers, MD  09/07/2020, 7:51 AM  '

## 2020-09-08 ENCOUNTER — Inpatient Hospital Stay (HOSPITAL_COMMUNITY): Payer: Medicare Other

## 2020-09-08 LAB — CBC WITH DIFFERENTIAL/PLATELET
Abs Immature Granulocytes: 0.03 10*3/uL (ref 0.00–0.07)
Basophils Absolute: 0 10*3/uL (ref 0.0–0.1)
Basophils Relative: 0 %
Eosinophils Absolute: 0.2 10*3/uL (ref 0.0–0.5)
Eosinophils Relative: 3 %
HCT: 45.6 % (ref 39.0–52.0)
Hemoglobin: 15 g/dL (ref 13.0–17.0)
Immature Granulocytes: 0 %
Lymphocytes Relative: 22 %
Lymphs Abs: 1.5 10*3/uL (ref 0.7–4.0)
MCH: 30.8 pg (ref 26.0–34.0)
MCHC: 32.9 g/dL (ref 30.0–36.0)
MCV: 93.6 fL (ref 80.0–100.0)
Monocytes Absolute: 0.7 10*3/uL (ref 0.1–1.0)
Monocytes Relative: 10 %
Neutro Abs: 4.3 10*3/uL (ref 1.7–7.7)
Neutrophils Relative %: 65 %
Platelets: 235 10*3/uL (ref 150–400)
RBC: 4.87 MIL/uL (ref 4.22–5.81)
RDW: 13.6 % (ref 11.5–15.5)
WBC: 6.8 10*3/uL (ref 4.0–10.5)
nRBC: 0 % (ref 0.0–0.2)

## 2020-09-08 LAB — BASIC METABOLIC PANEL
Anion gap: 9 (ref 5–15)
BUN: 30 mg/dL — ABNORMAL HIGH (ref 8–23)
CO2: 25 mmol/L (ref 22–32)
Calcium: 8.8 mg/dL — ABNORMAL LOW (ref 8.9–10.3)
Chloride: 102 mmol/L (ref 98–111)
Creatinine, Ser: 1.56 mg/dL — ABNORMAL HIGH (ref 0.61–1.24)
GFR, Estimated: 46 mL/min — ABNORMAL LOW (ref >60)
Glucose, Bld: 110 mg/dL — ABNORMAL HIGH (ref 70–99)
Potassium: 4.1 mmol/L (ref 3.5–5.1)
Sodium: 136 mmol/L (ref 135–145)

## 2020-09-08 MED ORDER — FLUTICASONE PROPIONATE 50 MCG/ACT NA SUSP
2.0000 | Freq: Every day | NASAL | Status: DC
  Administered 2020-09-09 – 2020-09-13 (×4): 2 via NASAL
  Filled 2020-09-08: qty 16

## 2020-09-08 MED ORDER — MELATONIN 5 MG PO TABS
10.0000 mg | ORAL_TABLET | Freq: Every evening | ORAL | Status: DC | PRN
  Administered 2020-09-08 – 2020-09-12 (×5): 10 mg via ORAL
  Filled 2020-09-08 (×5): qty 2

## 2020-09-08 MED ORDER — DM-GUAIFENESIN ER 30-600 MG PO TB12
1.0000 | ORAL_TABLET | Freq: Two times a day (BID) | ORAL | Status: DC
  Administered 2020-09-08 – 2020-09-13 (×11): 1 via ORAL
  Filled 2020-09-08 (×12): qty 1

## 2020-09-08 NOTE — Progress Notes (Signed)
PROGRESS NOTE    [name]  NOM:[id number] DOB: [birth year] DOA: 09/05/2020 PCP: [name], PA-C    Chief Complaint  Patient presents with  . Chest Pain    Brief Narrative:   Jorge [name] is a 75 y.o. male with medical history significant ofparoxysmal atrial fibrillation, chronic kidney disease stage III, diastolic heart failure, peripheral vascular disease, essential hypertension, history of due to aortic valve replacement, anxiety disorder, history of asthma who presented to the ER initially with 3 days of shortness of breath chest discomfort as well as weakness. In the ER patient was found to be in atrial fibrillation with rapid ventricular response, cardioversion not succesful. He was also found to be COVID positive, .  Pt seen and examined , he converted to sinus today. He reports congestion today. Will repeat CXR today.   Assessment & Plan:   Principal Problem:   Rapid atrial fibrillation (HCC) Active Problems:   Aortic stenosis   CAD (coronary artery disease)   HLD (hyperlipidemia)   Essential hypertension   History of cerebrovascular accident (CVA) with residual deficit   OSA on CPAP chronic diastolic congestive heart failure (HCC)   Carotid artery stenosis   CKD (chronic kidney disease) stage 3, GFR 30-59 ml/min (HCC)   COVID-19 virus infection   COVID 19 Virus infection: MAB infusion given.  Breathing has improved. He is currently on RA. Anti tussives as needed.  Reports feeling congested.    Atrial fib with RVR  Failed cardioversion in ED.  Converted to sinus with IV amiodarone. If he remains in sinus by tomorrow, will transition to oral amiodarone and discharge him.  Continue with aspirin,  And on eliquis for anti coagulation.  Cardiology on board.  Echocardiogram from 1/22 showed preserved LVEF, without any regional wall abn, left ventricular diastolic parameters are consistent with grade II diastolic dysfunction.   Chronic diastolic heart  failure:  He appears to be compensated. CXR shows No edema or airspace opacity. Stable cardiac silhouette. Status post aortic valve replacement. Continue with strict intake and output.  Repeat CXR today. Check labs today.    H/o CVA , carotid artery stenosis:  S/p redo transcatheter placement of left carotid stent with angioplasty.  Continue with aspirin and eliquis as per neurology recommendations.    Stage 3b CKD:  . Creatinine at 1.5 , better than baseline.  Recheck renal parameters today.    H/o TAVR;  Cardiology on board. Outpatient follow up.   Body mass index is 40.92 kg/m. Obesity:  Outpatient follow up with PCP.    Essential hypertension  Well controlled bp parameters.    OSA  On CPAP At home.   Hyperglycemia:  Last A1c is 5.8%   Hypothyroidism:  Resume synthroid.    DVT prophylaxis: Eliquis Code Status:  DNR Family Communication: NONE AT BEDSIDE.  Disposition:   Status is: Inpatient  Remains inpatient appropriate because:Ongoing diagnostic testing needed not appropriate for outpatient work up, IV treatments appropriate due to intensity of illness or inability to take PO and Inpatient level of care appropriate due to severity of illness   Dispo: The patient is from: Home              Anticipated d/c is to: pending.               Patient currently is not medically stable to d/c.   Difficult to place patient No       Consultants:   cardiology  Procedures: none  Antimicrobials:  Antibiotics Given (last 72 hours)    None      Subjective: Pt on RA, reports feeling congested, will get CXR,  Persistent cough, dry.  Objective: Vitals:   09/08/20 0437 09/08/20 0859 09/08/20 1500 09/08/20 1554  BP: (!) 144/75  140/60 (!) 171/91  Pulse: (!) 51  (!) 52 84  Resp: 14  18 18   Temp: 97.9 F (36.6 C) 98 F (36.7 C) 98 F (36.7 C) 98 F (36.7 C)  TempSrc: Oral Oral Oral Oral  SpO2: 96%     Weight:      Height:        Intake/Output  Summary (Last 24 hours) at 09/08/2020 1615 Last data filed at 09/08/2020 0900 Gross per 24 hour  Intake 817.75 ml  Output 2525 ml  Net -1707.25 ml   Filed Weights   09/06/20 0408  Weight: (!) 140.7 kg    Examination:  General exam: Patient is alert and comfortable on room air Respiratory system: Air entry fair bilateral no wheezing or rhonchi Cardiovascular system: S1-S2 heard, regular rate rhythm, no JVD no pedal edema Gastrointestinal system: Abdomen is soft nontender bowel sounds normal Central nervous system: Alert and oriented Extremities: No cyanosis Skin: No rashes seen.  Psychiatry: Is appropriate    Data Reviewed: I have personally reviewed following labs and imaging studies  CBC: Recent Labs  Lab 09/05/20 1501 09/06/20 0504  WBC 7.8 7.0  HGB 15.0 14.8  HCT 46.4 45.2  MCV 96.5 95.4  PLT 184 187    Basic Metabolic Panel: Recent Labs  Lab 09/05/20 1501 09/05/20 1944 09/06/20 0504  NA 137  --  137  K 4.0  --  4.3  CL 103  --  103  CO2 26  --  20*  GLUCOSE 102*  --  203*  BUN 25*  --  23  CREATININE 1.80*  --  1.50*  CALCIUM 8.6*  --  8.2*  MG  --  2.3  --     GFR: Estimated Creatinine Clearance: 62.7 mL/min (A) (by C-G formula based on SCr of 1.5 mg/dL (H)).  Liver Function Tests: No results for input(s): AST, ALT, ALKPHOS, BILITOT, PROT, ALBUMIN in the last 168 hours.  CBG: No results for input(s): GLUCAP in the last 168 hours.   Recent Results (from the past 240 hour(s))  Resp Panel by RT-PCR (Flu A&B, Covid) Nasopharyngeal Swab     Status: Abnormal   Collection Time: 09/05/20 10:46 PM   Specimen: Nasopharyngeal Swab; Nasopharyngeal(NP) swabs in vial transport medium  Result Value Ref Range Status   SARS Coronavirus 2 by RT PCR POSITIVE (A) NEGATIVE Final    Comment: RESULT CALLED TO, READ BACK BY AND VERIFIED WITH: RN CHRIS K. 11/05/20 0030 FCP (NOTE) SARS-CoV-2 target nucleic acids are DETECTED.  The SARS-CoV-2 RNA is generally  detectable in upper respiratory specimens during the acute phase of infection. Positive results are indicative of the presence of the identified virus, but do not rule out bacterial infection or co-infection with other pathogens not detected by the test. Clinical correlation with patient history and other diagnostic information is necessary to determine patient infection status. The expected result is Negative.  Fact Sheet for Patients: [id number]  Fact Sheet for Healthcare Providers: BloggerCourse.com  This test is not yet approved or cleared by the [employer] FDA and  has been authorized for detection and/or diagnosis of SARS-CoV-2 by FDA under an Emergency Use Authorization (EUA).  This EUA will remain in effect (meaning this  test can be used)  for the duration of  the COVID-19 declaration under Section 564(b)(1) of the Act, 21 U.S.C. section 360bbb-3(b)(1), unless the authorization is terminated or revoked sooner.     Influenza A by PCR NEGATIVE NEGATIVE Final   Influenza B by PCR NEGATIVE NEGATIVE Final    Comment: (NOTE) The Xpert Xpress SARS-CoV-2/FLU/RSV plus assay is intended as an aid in the diagnosis of influenza from Nasopharyngeal swab specimens and should not be used as a sole basis for treatment. Nasal washings and aspirates are unacceptable for Xpert Xpress SARS-CoV-2/FLU/RSV testing.  Fact Sheet for Patients: BloggerCourse.com  Fact Sheet for Healthcare Providers: SeriousBroker.it  This test is not yet approved or cleared by the [place] FDA and has been authorized for detection and/or diagnosis of SARS-CoV-2 by FDA under an Emergency Use Authorization (EUA). This EUA will remain in effect (meaning this test can be used) for the duration of the COVID-19 declaration under Section 564(b)(1) of the Act, 21 U.S.C. section 360bbb-3(b)(1), unless the  authorization is terminated or revoked.  Performed at [facility], [address] [place], [place] [address]          Radiology Studies: No results found.      Scheduled Meds: . apixaban  5 mg Oral BID  . aspirin  81 mg Oral Daily  . atorvastatin  40 mg Oral Daily  . dextromethorphan-guaiFENesin  1 tablet Oral BID  . fluticasone  2 spray Each Nare Daily  . levothyroxine  50 mcg Oral Q0600  . lisinopril  10 mg Oral Daily  . loratadine  10 mg Oral Daily  . metoprolol tartrate  12.5 mg Oral BID   Continuous Infusions: . amiodarone 30 mg/hr (09/08/20 0543)     LOS: 3 days        Kathlen Mody, MD [facility]   To contact the attending provider between 7A-7P or the covering provider during after hours 7P-7A, please log into the web site [email or web address] and access using universal  password for that web site. If you do not have the password, please call the hospital operator.  09/08/2020, 4:15 PM

## 2020-09-08 NOTE — Progress Notes (Signed)
   Progress Note  Patient Name: Jorge [name] Date of Encounter: 09/08/2020  Primary Cardiologist: Charlton Haws, MD  Chart reviewed.  Please see cardiology rounding note by Dr. Jens Som from June 3.  Patient is back in sinus rhythm by telemetry this morning and continues on IV amiodarone.  Currently afebrile, heart rate in the 50s in sinus rhythm by telemetry which I personally reviewed.  Systolic pressure 130s most recently.  Pertinent lab work includes potassium 4.3, BUN 23, creatinine 1.5 down from 1.8, LDL 52, hemoglobin 14.8, platelets 187.  He continues on aspirin and Eliquis (prior stroke and carotid stent intervention, now atrial fibrillation), started back on lisinopril and Lopressor yesterday, also IV amiodarone.  Plan to stop Lopressor for now in light of current heart rate in sinus rhythm and allow room to continue amiodarone.  When he is ready for discharge from the perspective of COVID-19 treatment, amiodarone can be converted to oral regimen at 200 mg twice daily for one week then 200 mg once daily.  He will need outpatient follow-up with cardiology (14 days) and hopefully be able to come off of antiarrhythmic therapy as he gets further out from COVID-19 diagnosis.  Signed, Nona Dell, MD  09/08/2020, 8:25 AM

## 2020-09-09 ENCOUNTER — Inpatient Hospital Stay (HOSPITAL_COMMUNITY): Payer: Medicare Other

## 2020-09-09 LAB — TROPONIN I (HIGH SENSITIVITY): Troponin I (High Sensitivity): 17 ng/L

## 2020-09-09 MED ORDER — NITROGLYCERIN 0.4 MG SL SUBL
0.4000 mg | SUBLINGUAL_TABLET | SUBLINGUAL | Status: DC | PRN
  Administered 2020-09-09: 0.4 mg via SUBLINGUAL
  Filled 2020-09-09: qty 1

## 2020-09-09 NOTE — Progress Notes (Signed)
   Progress Note  Patient Name: Jorge [name] Date of Encounter: 09/09/2020  Primary Cardiologist: Charlton Haws, MD  Chart reviewed.  Patient back in atrial fibrillation this morning.  Telemetry shows intermittent sinus rhythm with PACs and also atrial fibrillation with most recent onset around 7 AM.  He remains afebrile, systolic 103-137 most recently.  Heart rates 80s to 90s.  Pertinent lab work includes potassium 4.1, BUN 30, creatinine 1.56, hemoglobin 15.0, platelets 235.  He remains on Eliquis and aspirin, lisinopril, IV amiodarone, and Lopressor 12.5 mg twice daily was resumed.  Continue IV amiodarone, ultimately anticipate switch to oral dose 200 mg twice daily.  Signed, Nona Dell, MD  09/09/2020, 8:12 AM

## 2020-09-09 NOTE — Progress Notes (Signed)
Patient got up to go to the bathroom to have a bowel movement, and states he starting having 8 out of 10 chest pain.  Patient states that he is clammy, dizzy, and light-headed.  Also states that he saw a flash of light.  Patient describes chest pain as pressure and tightness.  He states that he feels like, something is sitting on top of his chest.  Patient also states that he feels short of breath.  EKG performed, 2 liters of oxygen placed on patient.  95% on  2L nasal cannula.  Resp-16, 91 pulse,and blood pressure is 135/91 at this time.  MD has been notified.  Will continue to monitor.     Darrick Grinder, RN

## 2020-09-09 NOTE — Progress Notes (Signed)
HOSPITAL MEDICINE OVERNIGHT EVENT NOTE    Suddenly began to experience chest discomfort shortly after rising from a seated position to go use the bathroom.  Patient reports that the symptoms started shortly after eating dinner.  EKG obtained which I have reviewed.  Patient is currently in atrial fibrillation and and a partial left bundle branch block without dynamic ST segment change.  Nursing reports the patient was exhibiting associated mild shortness of breath without hypoxia.  Patient was placed on some supplemental oxygen.  Of note, patient is already receiving Eliquis for anticoagulation for his atrial fibrillation.  Patient is additionally receiving aspirin, beta-blocker and statin therapy.  Considering patient's concerning symptoms and history of coronary artery disease, will administer as needed nitroglycerin.  Will obtain serial cardiac enzymes.  If nitroglycerin is ineffective, will consider a trial of GI cocktail.  Will monitor patient closely.  Marinda Elk  MD [facility]

## 2020-09-09 NOTE — Progress Notes (Signed)
PROGRESS NOTE    Jorge [name]  OHY:[id number] DOB: 1946-03-23 DOA: 09/05/2020 PCP: [name], PA-C    Chief Complaint  Patient presents with  . Chest Pain    Brief Narrative:   Jorge [name] is a 75 y.o. male with medical history significant ofparoxysmal atrial fibrillation, chronic kidney disease stage III, diastolic heart failure, peripheral vascular disease, essential hypertension, history of due to aortic valve replacement, anxiety disorder, history of asthma who presented to the ER initially with 3 days of shortness of breath chest discomfort as well as weakness. In the ER patient was found to be in atrial fibrillation with rapid ventricular response, cardioversion not succesful. He was also found to be COVID positive, .  Pt seen and examined , he converted to sinus today. He reports congestion today. Will repeat CXR today.   Assessment & Plan:   Principal Problem:   Rapid atrial fibrillation (HCC) Active Problems:   Aortic stenosis   CAD (coronary artery disease)   HLD (hyperlipidemia)   Essential hypertension   History of cerebrovascular accident (CVA) with residual deficit   OSA on CPAP chronic diastolic congestive heart failure (HCC)   Carotid artery stenosis   CKD (chronic kidney disease) stage 3, GFR 30-59 ml/min (HCC)   COVID-19 virus infection   COVID 19 Virus infection: MAB infusion given.  Breathing has improved. He is currently on RA. Anti tussives as needed.  Reports feeling congested and has persistent cough.  Repeat CXR shows left basilar atelectasis. Recommended to use incentive spirometry.    Atrial fib with RVR  Failed cardioversion in ED.  Converted to sinus with IV amiodarone. But today he is going back and forth to atrial fib and sinus tachy.  Continue with aspirin,  And on eliquis for anti coagulation.  Cardiology on board.  Echocardiogram from 1/22 showed preserved LVEF, without any regional wall abn, left ventricular diastolic  parameters are consistent with grade II diastolic dysfunction.   Chronic diastolic heart failure:  He appears to be compensated. CXR shows No edema or airspace opacity. Stable cardiac silhouette. Status post aortic valve replacement/ left basilar atelectasis on recent CXR.  Continue with strict intake and output.     H/o CVA , carotid artery stenosis:  S/p redo transcatheter placement of left carotid stent with angioplasty.  Continue with aspirin and eliquis as per neurology recommendations.    Stage 3b CKD:  STABLE creatinine.   H/o TAVR;  Cardiology on board. Outpatient follow up.   Body mass index is 40.92 kg/m. Obesity:  Outpatient follow up with PCP.    Essential hypertension  Well controlled BP parameters.    OSA  On CPAP At home.   Hyperglycemia:  Last A1c is 5.8%   Hypothyroidism:  Resume synthroid.    DVT prophylaxis: Eliquis Code Status:  DNR Family Communication: NONE AT BEDSIDE.  Disposition:   Status is: Inpatient  Remains inpatient appropriate because:Ongoing diagnostic testing needed not appropriate for outpatient work up, IV treatments appropriate due to intensity of illness or inability to take PO and Inpatient level of care appropriate due to severity of illness   Dispo: The patient is from: Home              Anticipated d/c is to: pending.               Patient currently is not medically stable to d/c.   Difficult to place patient No       Consultants:   cardiology  Procedures: none  Antimicrobials:  Antibiotics Given (last 72 hours)    None      Subjective: Reports feeling congested and having cough with minimal relief from the anti tussives.  Objective: Vitals:   09/09/20 0439 09/09/20 0836 09/09/20 0842 09/09/20 1356  BP: (!) 137/94 136/71 126/71 111/60  Pulse: 78 72  66  Resp: 14 18  17   Temp: 98.2 F (36.8 C) 98.2 F (36.8 C)  98 F (36.7 C)  TempSrc: Oral Oral  Oral  SpO2: 93%   100%  Weight:      Height:         Intake/Output Summary (Last 24 hours) at 09/09/2020 1511 Last data filed at 09/09/2020 1359 Gross per 24 hour  Intake 540 ml  Output 1550 ml  Net -1010 ml   Filed Weights   09/06/20 0408  Weight: (!) 140.7 kg    Examination:  General exam: alert and comfortable.  Respiratory system: air entry fair, on RA, no wheezing or rhonchi.  Cardiovascular system: S1-S2 heard, RRR no JVD, no pedal edema. Gastrointestinal system: Abdomen is soft, nt ND BS+ Central nervous system: alert and oriented, non focal.  Extremities: no pedal edema.  Skin: no rashes seen.  Psychiatry:  Mood. Is appropriate    Data Reviewed: I have personally reviewed following labs and imaging studies  CBC: Recent Labs  Lab 09/05/20 1501 09/06/20 0504 09/08/20 1839  WBC 7.8 7.0 6.8  NEUTROABS  --   --  4.3  HGB 15.0 14.8 15.0  HCT 46.4 45.2 45.6  MCV 96.5 95.4 93.6  PLT 184 187 235    Basic Metabolic Panel: Recent Labs  Lab 09/05/20 1501 09/05/20 1944 09/06/20 0504 09/08/20 1839  NA 137  --  137 136  K 4.0  --  4.3 4.1  CL 103  --  103 102  CO2 26  --  20* 25  GLUCOSE 102*  --  203* 110*  BUN 25*  --  23 30*  CREATININE 1.80*  --  1.50* 1.56*  CALCIUM 8.6*  --  8.2* 8.8*  MG  --  2.3  --   --     GFR: Estimated Creatinine Clearance: 60.3 mL/min (A) (by C-G formula based on SCr of 1.56 mg/dL (H)).  Liver Function Tests: No results for input(s): AST, ALT, ALKPHOS, BILITOT, PROT, ALBUMIN in the last 168 hours.  CBG: No results for input(s): GLUCAP in the last 168 hours.   Recent Results (from the past 240 hour(s))  Resp Panel by RT-PCR (Flu A&B, Covid) Nasopharyngeal Swab     Status: Abnormal   Collection Time: 09/05/20 10:46 PM   Specimen: Nasopharyngeal Swab; Nasopharyngeal(NP) swabs in vial transport medium  Result Value Ref Range Status   SARS Coronavirus 2 by RT PCR POSITIVE (A) NEGATIVE Final    Comment: RESULT CALLED TO, READ BACK BY AND VERIFIED WITH: RN CHRIS K. 11/05/20  0030 FCP (NOTE) SARS-CoV-2 target nucleic acids are DETECTED.  The SARS-CoV-2 RNA is generally detectable in upper respiratory specimens during the acute phase of infection. Positive results are indicative of the presence of the identified virus, but do not rule out bacterial infection or co-infection with other pathogens not detected by the test. Clinical correlation with patient history and other diagnostic information is necessary to determine patient infection status. The expected result is Negative.  Fact Sheet for Patients: E9598085  Fact Sheet for Healthcare Providers: BloggerCourse.com  This test is not yet approved or cleared by the [employer]  States FDA and  has been authorized for detection and/or diagnosis of SARS-CoV-2 by FDA under an Emergency Use Authorization (EUA).  This EUA will remain in effect (meaning this test can be used)  for the duration of  the COVID-19 declaration under Section 564(b)(1) of the Act, 21 U.S.C. section 360bbb-3(b)(1), unless the authorization is terminated or revoked sooner.     Influenza A by PCR NEGATIVE NEGATIVE Final   Influenza B by PCR NEGATIVE NEGATIVE Final    Comment: (NOTE) The Xpert Xpress SARS-CoV-2/FLU/RSV plus assay is intended as an aid in the diagnosis of influenza from Nasopharyngeal swab specimens and should not be used as a sole basis for treatment. Nasal washings and aspirates are unacceptable for Xpert Xpress SARS-CoV-2/FLU/RSV testing.  Fact Sheet for Patients: BloggerCourse.com  Fact Sheet for Healthcare Providers: SeriousBroker.it  This test is not yet approved or cleared by the [place] FDA and has been authorized for detection and/or diagnosis of SARS-CoV-2 by FDA under an Emergency Use Authorization (EUA). This EUA will remain in effect (meaning this test can be used) for the duration of  the COVID-19 declaration under Section 564(b)(1) of the Act, 21 U.S.C. section 360bbb-3(b)(1), unless the authorization is terminated or revoked.  Performed at [facility], [address] [place], [place] [address]          Radiology Studies: DG CHEST PORT 1 VIEW  Result Date: 09/08/2020 CLINICAL DATA:  Shortness of breath EXAM: PORTABLE CHEST 1 VIEW COMPARISON:  Chest radiograph dated 09/05/2020. FINDINGS: The heart is enlarged. Vascular calcifications are seen in the aortic arch. The right hemidiaphragm is elevated relative to the left, unchanged. There is mild left basilar atelectasis. The right lung is clear. There is no significant pleural effusion or pneumothorax. Degenerative changes are seen in the spine. IMPRESSION: Mild left basilar atelectasis. Aortic Atherosclerosis (ICD10-I70.0). Electronically Signed   By: Romona Curls M.D.   On: 09/08/2020 18:43        Scheduled Meds: . apixaban  5 mg Oral BID  . aspirin  81 mg Oral Daily  . atorvastatin  40 mg Oral Daily  . dextromethorphan-guaiFENesin  1 tablet Oral BID  . fluticasone  2 spray Each Nare Daily  . levothyroxine  50 mcg Oral Q0600  . lisinopril  10 mg Oral Daily  . loratadine  10 mg Oral Daily  . metoprolol tartrate  12.5 mg Oral BID   Continuous Infusions: . amiodarone 30 mg/hr (09/09/20 0508)     LOS: 4 days        Kathlen Mody, MD [facility]   To contact the attending provider between 7A-7P or the covering provider during after hours 7P-7A, please log into the web site [email or web address] and access using universal [place] password for that web site. If you do not have the password, please call the hospital operator.  09/09/2020, 3:11 PM

## 2020-09-10 DIAGNOSIS — Z952 Presence of prosthetic heart valve: Secondary | ICD-10-CM

## 2020-09-10 DIAGNOSIS — R079 Chest pain, unspecified: Secondary | ICD-10-CM

## 2020-09-10 DIAGNOSIS — I6523 Occlusion and stenosis of bilateral carotid arteries: Secondary | ICD-10-CM

## 2020-09-10 LAB — HEMOGLOBIN A1C
Hgb A1c MFr Bld: 5.9 % — ABNORMAL HIGH (ref 4.8–5.6)
Mean Plasma Glucose: 123 mg/dL

## 2020-09-10 LAB — TROPONIN I (HIGH SENSITIVITY): Troponin I (High Sensitivity): 15 ng/L (ref <18)

## 2020-09-10 MED ORDER — PANTOPRAZOLE SODIUM 40 MG PO TBEC
40.0000 mg | DELAYED_RELEASE_TABLET | Freq: Every day | ORAL | Status: DC
  Administered 2020-09-10 – 2020-09-13 (×4): 40 mg via ORAL
  Filled 2020-09-10 (×4): qty 1

## 2020-09-10 NOTE — Progress Notes (Signed)
Subjective:  Feels poorly Weak had some SSCP last night ongoing dyspnea   Objective:  Vitals:   09/09/20 2118 09/09/20 2336 09/10/20 0508 09/10/20 0736  BP: 111/73 118/68 105/63 94/69  Pulse: 72 73 76 72  Resp: (!) 22 14 17 15   Temp:  98.1 F (36.7 C) 97.8 F (36.6 C) 98 F (36.7 C)  TempSrc:  Oral Oral Oral  SpO2: 95% 96% 91% 94%  Weight:      Height:        Intake/Output from previous day:  Intake/Output Summary (Last 24 hours) at 09/10/2020 1057 Last data filed at 09/10/2020 1020 Gross per 24 hour  Intake 561.84 ml  Output 750 ml  Net -188.16 ml    Physical Exam: Obese white male Left trans carotid stent with bruit Lungs clear SEM across TAVR valve  Abdomen soft Trace edema  Lab Results: Basic Metabolic Panel: Recent Labs    09/08/20 1839  NA 136  K 4.1  CL 102  CO2 25  GLUCOSE 110*  BUN 30*  CREATININE 1.56*  CALCIUM 8.8*   Liver Function Tests: No results for input(s): AST, ALT, ALKPHOS, BILITOT, PROT, ALBUMIN in the last 72 hours. No results for input(s): LIPASE, AMYLASE in the last 72 hours. CBC: Recent Labs    09/08/20 1839  WBC 6.8  NEUTROABS 4.3  HGB 15.0  HCT 45.6  MCV 93.6  PLT 235    Imaging: DG Chest 1 View  Result Date: 09/09/2020 CLINICAL DATA:  Dyspnea EXAM: CHEST  1 VIEW COMPARISON:  09/08/2020, 04/23/2020 FINDINGS: Mild elevation of the right hemidiaphragm is unchanged. Interstitial thickening within the left lung base is stable, most in keeping with interstitial scarring. Lungs are otherwise clear. No pneumothorax or pleural effusion. Cardiac size within normal limits. Pulmonary vascularity is normal. No acute bone abnormality. IMPRESSION: No active disease.  Stable elevation of the right hemidiaphragm. Electronically Signed   By: 04/25/2020 MD   On: 09/09/2020 22:11   DG CHEST PORT 1 VIEW  Result Date: 09/08/2020 CLINICAL DATA:  Shortness of breath EXAM: PORTABLE CHEST 1 VIEW COMPARISON:  Chest radiograph dated  09/05/2020. FINDINGS: The heart is enlarged. Vascular calcifications are seen in the aortic arch. The right hemidiaphragm is elevated relative to the left, unchanged. There is mild left basilar atelectasis. The right lung is clear. There is no significant pleural effusion or pneumothorax. Degenerative changes are seen in the spine. IMPRESSION: Mild left basilar atelectasis. Aortic Atherosclerosis (ICD10-I70.0). Electronically Signed   By: 11/05/2020 M.D.   On: 09/08/2020 18:43    Cardiac Studies:  ECG: 09/09/20 flutter rate 102 IVCD LAD    Telemetry: aflutter rates 80's   Echo:  04/24/20 normal TAVR valve EF 60-65% mild LAE   Medications:   . apixaban  5 mg Oral BID  . aspirin  81 mg Oral Daily  . atorvastatin  40 mg Oral Daily  . dextromethorphan-guaiFENesin  1 tablet Oral BID  . fluticasone  2 spray Each Nare Daily  . levothyroxine  50 mcg Oral Q0600  . lisinopril  10 mg Oral Daily  . loratadine  10 mg Oral Daily  . metoprolol tartrate  12.5 mg Oral BID  . pantoprazole  40 mg Oral Daily     . amiodarone 30 mg/hr (09/10/20 0618)    Assessment/Plan:  1. PAF:  Continue iv amiodarone and lopressor Failed [facility] with early reversion PAF in ER this admission rate control improved on eliquis will likely be d/c  with amiodarone loading and outpatient f/u EF normal only mild LAE  2. TAVR 2018 23 mm Sapien 3 normal function on TTE January 2022  3. Vascular :  Post left carotid stenting  With residual stenosis and kinking of vessel distal to stent on duplex 05/02/20 right subclavian steal would take BP in left arm for accuracy f/u with VVS   4. COVID:  Vaccinated CXR 09/09/20 NAD  5. Chest Pain: atypical r/o no acute ECG changes troponin negative Had no significant CAD at cath 2018 prior to TAVR observe   Charlton Haws 09/10/2020, 10:57 AM

## 2020-09-10 NOTE — Care Management Important Message (Signed)
Important Message  Patient Details  Name: Jorge [name] MRN: [id number] Date of Birth: [birth year]   Medicare Important Message Given:  Yes     Renie Ora 09/10/2020, 11:08 AM

## 2020-09-10 NOTE — Progress Notes (Signed)
PROGRESS NOTE    Jorge [name]  CVE:[id number] DOB: [birth year] DOA: 09/05/2020 PCP: [name], PA-C    Chief Complaint  Patient presents with  . Chest Pain    Brief Narrative:   Jorge [name] is a 75 y.o. male with medical history significant ofparoxysmal atrial fibrillation, chronic kidney disease stage III, diastolic heart failure, peripheral vascular disease, essential hypertension, history of due to aortic valve replacement, anxiety disorder, history of asthma who presented to the ER initially with 3 days of shortness of breath chest discomfort as well as weakness. In the ER patient was found to be in atrial fibrillation with rapid ventricular response, cardioversion not succesful. He was also found to be COVID positive, . He underwent MAB infusion, remains on RA and reports improving.  He was started on IV amiodarone for atrial fibrillation, current in sinus . Plan to transition to oral amiodarone on discharge in 1 to 2 days.   Assessment & Plan:   Principal Problem:   Rapid atrial fibrillation (HCC) Active Problems:   Aortic stenosis   CAD (coronary artery disease)   HLD (hyperlipidemia)   Essential hypertension   History of cerebrovascular accident (CVA) with residual deficit   OSA on CPAP chronic diastolic congestive heart failure (HCC)   Carotid artery stenosis   CKD (chronic kidney disease) stage 3, GFR 30-59 ml/min (HCC)   COVID-19 virus infection   COVID 19 Virus infection: MAB infusion given.  Breathing has improved. He is currently on RA. Anti tussives as needed.  Reports feeling congested and has persistent cough.  Repeat CXR shows left basilar atelectasis. Recommended to use incentive spirometry.     Atrial fib with RVR  Failed cardioversion in ED.  Converted to sinus with IV amiodarone. Plan to transition him to oral amiodarone on discharge and follow up with cardiology as outpt.   Continue with aspirin,  And on eliquis for anti coagulation.   Cardiology on board.  Echocardiogram from 1/22 showed preserved LVEF, without any regional wall abn, left ventricular diastolic parameters are consistent with grade II diastolic dysfunction.   Chronic diastolic heart failure:  He appears to be compensated. CXR shows No edema or airspace opacity. Stable cardiac silhouette. Status post aortic valve replacement/ left basilar atelectasis on recent CXR.  Continue with strict intake and output.     H/o CVA , carotid artery stenosis:  S/p redo transcatheter placement of left carotid stent with angioplasty.  Continue with aspirin and eliquis as per neurology recommendations.    Stage 3b CKD:  Creatinine stable around 1.5  H/o TAVR;  Cardiology on board. Outpatient follow up.   Body mass index is 40.92 kg/m. Obesity:  Outpatient follow up with PCP.    Essential hypertension  Well controlled.    OSA  On CPAP At home.   Hyperglycemia:  Last A1c is 5.8%   Hypothyroidism:  Resume synthroid.   Chest pain: overnight.  troponins negative, EKG no dynamic changes.  Will add protonix to his regimen.    DVT prophylaxis: Eliquis Code Status:  DNR Family Communication: NONE AT BEDSIDE. Discussed with son at bedside.  Disposition:   Status is: Inpatient  Remains inpatient appropriate because:Ongoing diagnostic testing needed not appropriate for outpatient work up, IV treatments appropriate due to intensity of illness or inability to take PO and Inpatient level of care appropriate due to severity of illness   Dispo: The patient is from: Home  Anticipated d/c is to: Home              Patient currently is not medically stable to d/c.   Difficult to place patient No       Consultants:   cardiology  Procedures: none  Antimicrobials:  Antibiotics Given (last 72 hours)    None      Subjective: reports feeling better after a long time.  No chest pain or sob today.  Objective: Vitals:   09/10/20 0736  09/10/20 1243 09/10/20 1400 09/10/20 1636  BP: 94/69 109/80  (!) 109/51  Pulse: 72 80  64  Resp: 15 16  16   Temp: 98 F (36.7 C) 98 F (36.7 C)  98 F (36.7 C)  TempSrc: Oral Oral  Axillary  SpO2: 94% 96% 95% 94%  Weight:      Height:        Intake/Output Summary (Last 24 hours) at 09/10/2020 1703 Last data filed at 09/10/2020 1531 Gross per 24 hour  Intake 561.84 ml  Output 1135 ml  Net -573.16 ml   Filed Weights   09/06/20 0408  Weight: (!) 140.7 kg    Examination:  General exam: elderly gentleman, on RA not in distress.  Respiratory system: clear to ausculation, no wheezing heard, no rhonchi, on RA.  Cardiovascular system: S1-S2 heard, RRR, NO JVD, no pedal edema.  Gastrointestinal system: Abdomen is soft, non tender non distended bowel sounds wnl.  Central nervous system: Alert and Oriented, grossly non focal  Extremities: no pedal edema.  Skin: No rashes seen.  Psychiatry:  Mood is appropriate.     Data Reviewed: I have personally reviewed following labs and imaging studies  CBC: Recent Labs  Lab 09/05/20 1501 09/06/20 0504 09/08/20 1839  WBC 7.8 7.0 6.8  NEUTROABS  --   --  4.3  HGB 15.0 14.8 15.0  HCT 46.4 45.2 45.6  MCV 96.5 95.4 93.6  PLT 184 187 235    Basic Metabolic Panel: Recent Labs  Lab 09/05/20 1501 09/05/20 1944 09/06/20 0504 09/08/20 1839  NA 137  --  137 136  K 4.0  --  4.3 4.1  CL 103  --  103 102  CO2 26  --  20* 25  GLUCOSE 102*  --  203* 110*  BUN 25*  --  23 30*  CREATININE 1.80*  --  1.50* 1.56*  CALCIUM 8.6*  --  8.2* 8.8*  MG  --  2.3  --   --     GFR: Estimated Creatinine Clearance: 60.3 mL/min (A) (by C-G formula based on SCr of 1.56 mg/dL (H)).  Liver Function Tests: No results for input(s): AST, ALT, ALKPHOS, BILITOT, PROT, ALBUMIN in the last 168 hours.  CBG: No results for input(s): GLUCAP in the last 168 hours.   Recent Results (from the past 240 hour(s))  Resp Panel by RT-PCR (Flu A&B, Covid)  Nasopharyngeal Swab     Status: Abnormal   Collection Time: 09/05/20 10:46 PM   Specimen: Nasopharyngeal Swab; Nasopharyngeal(NP) swabs in vial transport medium  Result Value Ref Range Status   SARS Coronavirus 2 by RT PCR POSITIVE (A) NEGATIVE Final    Comment: RESULT CALLED TO, READ BACK BY AND VERIFIED WITH: RN CHRIS K. [id number] FCP (NOTE) SARS-CoV-2 target nucleic acids are DETECTED.  The SARS-CoV-2 RNA is generally detectable in upper respiratory specimens during the acute phase of infection. Positive results are indicative of the presence of the identified virus, but do not rule out  bacterial infection or co-infection with other pathogens not detected by the test. Clinical correlation with patient history and other diagnostic information is necessary to determine patient infection status. The expected result is Negative.  Fact Sheet for Patients: BloggerCourse.com  Fact Sheet for Healthcare Providers: SeriousBroker.it  This test is not yet approved or cleared by the [place] FDA and  has been authorized for detection and/or diagnosis of SARS-CoV-2 by FDA under an Emergency Use Authorization (EUA).  This EUA will remain in effect (meaning this test can be used)  for the duration of  the COVID-19 declaration under Section 564(b)(1) of the Act, 21 U.S.C. section 360bbb-3(b)(1), unless the authorization is terminated or revoked sooner.     Influenza A by PCR NEGATIVE NEGATIVE Final   Influenza B by PCR NEGATIVE NEGATIVE Final    Comment: (NOTE) The Xpert Xpress SARS-CoV-2/FLU/RSV plus assay is intended as an aid in the diagnosis of influenza from Nasopharyngeal swab specimens and should not be used as a sole basis for treatment. Nasal washings and aspirates are unacceptable for Xpert Xpress SARS-CoV-2/FLU/RSV testing.  Fact Sheet for Patients: BloggerCourse.com  Fact Sheet for Healthcare  Providers: SeriousBroker.it  This test is not yet approved or cleared by the [place] FDA and has been authorized for detection and/or diagnosis of SARS-CoV-2 by FDA under an Emergency Use Authorization (EUA). This EUA will remain in effect (meaning this test can be used) for the duration of the COVID-19 declaration under Section 564(b)(1) of the Act, 21 U.S.C. section 360bbb-3(b)(1), unless the authorization is terminated or revoked.  Performed at [facility], [address], [place], [place] [address]          Radiology Studies: DG Chest 1 View  Result Date: 09/09/2020 CLINICAL DATA:  Dyspnea EXAM: CHEST  1 VIEW COMPARISON:  09/08/2020, 04/23/2020 FINDINGS: Mild elevation of the right hemidiaphragm is unchanged. Interstitial thickening within the left lung base is stable, most in keeping with interstitial scarring. Lungs are otherwise clear. No pneumothorax or pleural effusion. Cardiac size within normal limits. Pulmonary vascularity is normal. No acute bone abnormality. IMPRESSION: No active disease.  Stable elevation of the right hemidiaphragm. Electronically Signed   By: Helyn Numbers MD   On: 09/09/2020 22:11   DG CHEST PORT 1 VIEW  Result Date: 09/08/2020 CLINICAL DATA:  Shortness of breath EXAM: PORTABLE CHEST 1 VIEW COMPARISON:  Chest radiograph dated 09/05/2020. FINDINGS: The heart is enlarged. Vascular calcifications are seen in the aortic arch. The right hemidiaphragm is elevated relative to the left, unchanged. There is mild left basilar atelectasis. The right lung is clear. There is no significant pleural effusion or pneumothorax. Degenerative changes are seen in the spine. IMPRESSION: Mild left basilar atelectasis. Aortic Atherosclerosis (ICD10-I70.0). Electronically Signed   By: Romona Curls M.D.   On: 09/08/2020 18:43        Scheduled Meds: . apixaban  5 mg Oral BID  . aspirin  81 mg Oral Daily  . atorvastatin  40 mg Oral  Daily  . dextromethorphan-guaiFENesin  1 tablet Oral BID  . fluticasone  2 spray Each Nare Daily  . levothyroxine  50 mcg Oral Q0600  . lisinopril  10 mg Oral Daily  . loratadine  10 mg Oral Daily  . metoprolol tartrate  12.5 mg Oral BID  . pantoprazole  40 mg Oral Daily   Continuous Infusions: . amiodarone 30 mg/hr (09/10/20 0618)     LOS: 5 days        Khamille Beynon  Blake Divine, MD [facility]   To contact the attending provider between 7A-7P or the covering provider during after hours 7P-7A, please log into the web site [email or web address] and access using universal [place] password for that web site. If you do not have the password, please call the hospital operator.  09/10/2020, 5:03 PM

## 2020-09-10 NOTE — Progress Notes (Signed)
PT Cancellation Note  Patient Details Name: Jorge [name] MRN: [id number] DOB: [birth year]   Cancelled Treatment:    Reason Eval/Treat Not Completed: Patient declined, no reason specified;Fatigue/lethargy limiting ability to participate. States he has not been able to sleep. Agrees to try later. Will re-attempt later as time allows.    [name] 09/10/2020, 11:07 AM

## 2020-09-10 NOTE — Progress Notes (Signed)
Pt requested all 4 bed side rails to be up so he can reposition himself as needed.

## 2020-09-10 NOTE — Progress Notes (Signed)
Physical Therapy Treatment Patient Details Name: Jorge [name] MRN: [id number] DOB: 02-14-1946 Today's Date: 09/10/2020    History of Present Illness 75 y.o. male presenting to ER on 09/05/2020 initally with 3 days of SOB, chest discomfort, and weakness. In the ER, pt found to be in a fib with RVR. While waiting for admission pt is COVID positive, likely triggering a fib with RVR. PMH includes paroxysmal atrial fibrillation, chronic kidney disease stage III, diastolic heart failure, peripheral vascular disease, essential hypertension, history of due to aortic valve replacement, anxiety disorder, history of asthma.    PT Comments    Patient received in bed, agrees to PT session. States he was able to get a little rest. Patient had episode of chest pain last night. RN cleared patient to work with therapy. Patient reports he is feeling less stable and weaker today. Able to perform bed mobility with mod independence, min guard for sit to standing. Ambulated 40 feet in room with SPC and holding to IV pole with other hand. Min guard for safety. Patient ambulates with slow, deliberate pace. He will continue to benefit from skilled PT while here to improve balance, strength, activity tolerance to return home safely. Patient voices his concern about going home alone. Son may be able to check in more often. He does not want to go to SNF.        Follow Up Recommendations  Home health PT;Supervision - Intermittent     Equipment Recommendations  None recommended by PT    Recommendations for Other Services       Precautions / Restrictions Precautions Precautions: Fall Restrictions Weight Bearing Restrictions: No    Mobility  Bed Mobility Overal bed mobility: Modified Independent             General bed mobility comments: increased time, HOB elevated, pt owns adjustable bed    Transfers Overall transfer level: Needs assistance Equipment used: None Transfers: Sit to/from Stand Sit to  Stand: Min guard            Ambulation/Gait Ambulation/Gait assistance: Min guard Gait Distance (Feet): 40 Feet Assistive device: Straight cane;IV Pole Gait Pattern/deviations: Step-to pattern;Decreased step length - right;Decreased step length - left;Wide base of support Gait velocity: reduced   General Gait Details: pt with slowed step-to gait  reduced step length and gait speed   Stairs             Wheelchair Mobility    Modified Rankin (Stroke Patients Only)       Balance Overall balance assessment: Needs assistance Sitting-balance support: Feet supported Sitting balance-Leahy Scale: Good     Standing balance support: Bilateral upper extremity supported;During functional activity Standing balance-Leahy Scale: Fair Standing balance comment: reliant on unilateral UE support of cane and using IV pole with other arm to stabilize.                            Cognition Arousal/Alertness: Awake/alert Behavior During Therapy: WFL for tasks assessed/performed Overall Cognitive Status: Within Functional Limits for tasks assessed                                        Exercises      General Comments        Pertinent Vitals/Pain Pain Assessment: No/denies pain    Home Living  Prior Function            PT Goals (current goals can now be found in the care plan section) Acute Rehab PT Goals Patient Stated Goal: to go home and return to baseline PT Goal Formulation: With patient Time For Goal Achievement: 09/24/20 Potential to Achieve Goals: Good Progress towards PT goals: Progressing toward goals    Frequency    Min 3X/week      PT Plan Current plan remains appropriate    Co-evaluation              AM-PAC PT "6 Clicks" Mobility   Outcome Measure  Help needed turning from your back to your side while in a flat bed without using bedrails?: None Help needed moving from lying on  your back to sitting on the side of a flat bed without using bedrails?: A Little Help needed moving to and from a bed to a chair (including a wheelchair)?: A Little Help needed standing up from a chair using your arms (e.g., wheelchair or bedside chair)?: A Little Help needed to walk in hospital room?: A Little Help needed climbing 3-5 steps with a railing? : A Lot 6 Click Score: 18    End of Session Equipment Utilized During Treatment: Gait belt Activity Tolerance: Patient limited by fatigue Patient left: in bed;with bed alarm set;with call bell/phone within reach Nurse Communication: Mobility status PT Visit Diagnosis: Unsteadiness on feet (R26.81);Muscle weakness (generalized) (M62.81);Difficulty in walking, not elsewhere classified (R26.2)     Time: 3664-4034 PT Time Calculation (min) (ACUTE ONLY): 23 min  Charges:  $Gait Training: 23-37 mins                     Kiersten Coss, PT, GCS 09/10/20,2:07 PM

## 2020-09-11 ENCOUNTER — Inpatient Hospital Stay (HOSPITAL_COMMUNITY): Payer: Medicare Other

## 2020-09-11 LAB — BASIC METABOLIC PANEL
Anion gap: 12 (ref 5–15)
BUN: 30 mg/dL — ABNORMAL HIGH (ref 8–23)
CO2: 22 mmol/L (ref 22–32)
Calcium: 8.5 mg/dL — ABNORMAL LOW (ref 8.9–10.3)
Chloride: 101 mmol/L (ref 98–111)
Creatinine, Ser: 1.79 mg/dL — ABNORMAL HIGH (ref 0.61–1.24)
GFR, Estimated: 39 mL/min — ABNORMAL LOW (ref >60)
Glucose, Bld: 136 mg/dL — ABNORMAL HIGH (ref 70–99)
Potassium: 4.1 mmol/L (ref 3.5–5.1)
Sodium: 135 mmol/L (ref 135–145)

## 2020-09-11 LAB — CBC
HCT: 48.4 % (ref 39.0–52.0)
Hemoglobin: 16.2 g/dL (ref 13.0–17.0)
MCH: 30.9 pg (ref 26.0–34.0)
MCHC: 33.5 g/dL (ref 30.0–36.0)
MCV: 92.4 fL (ref 80.0–100.0)
Platelets: 263 10*3/uL (ref 150–400)
RBC: 5.24 MIL/uL (ref 4.22–5.81)
RDW: 13.6 % (ref 11.5–15.5)
WBC: 10.1 10*3/uL (ref 4.0–10.5)
nRBC: 0 % (ref 0.0–0.2)

## 2020-09-11 LAB — BRAIN NATRIURETIC PEPTIDE: B Natriuretic Peptide: 17.1 pg/mL (ref 0.0–100.0)

## 2020-09-11 LAB — MAGNESIUM: Magnesium: 2.3 mg/dL (ref 1.7–2.4)

## 2020-09-11 MED ORDER — AMIODARONE HCL 200 MG PO TABS
200.0000 mg | ORAL_TABLET | Freq: Two times a day (BID) | ORAL | Status: DC
  Administered 2020-09-11 – 2020-09-13 (×5): 200 mg via ORAL
  Filled 2020-09-11 (×5): qty 1

## 2020-09-11 MED ORDER — LISINOPRIL 5 MG PO TABS: 5.0000 mg | ORAL_TABLET | Freq: Every day | ORAL | Status: DC

## 2020-09-11 NOTE — Progress Notes (Signed)
OT Cancellation Note  Patient Details Name: Jorge [name] MRN: [id number] DOB: November 11, 1945   Cancelled Treatment:    Reason Eval/Treat Not Completed: Fatigue/lethargy limiting ability to participate. OT will follow up next available time  Galen Manila 09/11/2020, 2:20 PM

## 2020-09-11 NOTE — Plan of Care (Signed)

## 2020-09-11 NOTE — Progress Notes (Signed)
PROGRESS NOTE                                                                                                                                                                                                             Patient Demographics:    Jorge [name], is a 75 y.o. male, DOB - 01-Feb-1946, ZOX:[id number]  Outpatient Primary MD for the patient is Arlyss QueenConroy, Nathan, PA-C    LOS - 6  Admit date - 09/05/2020    Chief Complaint  Patient presents with  . Chest Pain       Brief Narrative (HPI from H&P) - Jorge LuisGeorge O Whatleyis a 75 y.o.malewith medical history significant ofparoxysmal atrial fibrillation, chronic kidney disease stage III, diastolic heart failure, peripheral vascular disease, essential hypertension, history of due to aortic valve replacement, anxiety disorder, history of asthma who presented to the ER initially with 3 days of shortness of breath chest discomfort as well as weakness. In the ER patient was found to be in atrial fibrillation with rapid ventricular response, cardioversion not succesful. He was also found to be COVID positive. He underwent MAB infusion, remains on RA and reports improving. He was started on IV amiodarone for atrial fibrillation, current in sinus . Plan to transition to oral amiodarone on discharge in 1 to 2 days.   Subjective:    Jorge [name] today has, No headache, No chest pain, No abdominal pain - No Nausea, No new weakness tingling or numbness, no SOB.   Assessment  & Plan :      1. Mild and Incidental COVID 19 Virus infection:  MAB infusion given by previous team, currently not short of breath, encouraged to sit up in chair use I-S and flutter valve for pulmonary toiletry, chest x-ray stable and clinically asymptomatic.   Recent Labs  Lab 09/05/20 1501 09/05/20 1944 09/05/20 2246 09/06/20 0504 09/08/20 1839 09/11/20 0626  WBC 7.8  --   --  7.0 6.8 10.1  HGB 15.0  --   --   14.8 15.0 16.2  HCT 46.4  --   --  45.2 45.6 48.4  PLT 184  --   --  187 235 263  BNP  --  130.4*  --   --   --  17.1  SARSCOV2NAA  --   --  POSITIVE*  --   --   --  2.  Paroxysmal atrial fib with RVR, [place] vas 2 score of > 3.  Cardiology on board currently in sinus rhythm, on IV amiodarone, had earlier failed cardioversion, possibly can convert to oral Will defer to cardiology, continue Eliquis.  Recent echo shows a preserved EF of 60%.    3. Chronic diastolic heart failure: He appears to be compensated.   4. H/o CVA , carotid artery stenosis: S/p redo transcatheter placement of left carotid stent with angioplasty. Continue with aspirin, statin and eliquis as per neurology recommendations.   5. Stage 3b CKD: Creatinine stable around 1.8  6. H/o TAVR; Cardiology on board. Outpatient follow up.   7.  Body mass index is 40.92 kg/m. Obesity: Outpatient follow up with PCP.   8. Essential hypertension - Well controlled on B. Blocker + ACE.  9. OSA - On CPAP At home.   10. Hypothyroidism: on synthroid.        Condition - Extremely Guarded  Family Communication  :  None bed side  Code Status :  Full  Consults  :  Cards  PUD Prophylaxis : PPI   Procedures  :            Disposition Plan  :    Status is: Inpatient  Remains inpatient appropriate because:IV treatments appropriate due to intensity of illness or inability to take PO   Dispo: The patient is from: Home              Anticipated d/c is to: Home              Patient currently is not medically stable to d/c.   Difficult to place patient No  DVT Prophylaxis  :  apixaban (ELIQUIS) tablet 5 mg      Lab Results  Component Value Date   PLT 263 09/11/2020    Diet :  Diet Order            Diet Heart Room service appropriate? Yes; Fluid consistency: Thin  Diet effective now                  Inpatient Medications  Scheduled Meds: . apixaban  5 mg Oral BID  . aspirin  81 mg Oral Daily  .  atorvastatin  40 mg Oral Daily  . dextromethorphan-guaiFENesin  1 tablet Oral BID  . fluticasone  2 spray Each Nare Daily  . levothyroxine  50 mcg Oral Q0600  . [START ON 09/12/2020] lisinopril  5 mg Oral Daily  . loratadine  10 mg Oral Daily  . metoprolol tartrate  12.5 mg Oral BID  . pantoprazole  40 mg Oral Daily   Continuous Infusions: . amiodarone 30 mg/hr (09/11/20 0700)   PRN Meds:.acetaminophen, melatonin, menthol-cetylpyridinium, nitroGLYCERIN, ondansetron (ZOFRAN) IV, polyvinyl alcohol, traMADol  Antibiotics  :    Anti-infectives (From admission, onward)   None       Time Spent in minutes  30   Susa Raring M.D on 09/11/2020 at 9:53 AM  To page go to [email or web address]   [facility] -  Office  [phone]    See all Orders from today for further details    Objective:   Vitals:   09/10/20 2146 09/11/20 0107 09/11/20 0508 09/11/20 0800  BP: (!) 159/105 (!) 140/59 (!) 144/53 (!) 115/91  Pulse: 77 64 67   Resp: 20 20 18    Temp: 97.7 F (36.5 C) 97.6 F (36.4 C) 98.4 F (36.9 C) 98 F (36.7 C)  TempSrc: Oral Oral  Oral   SpO2: 98% 97% 93%   Weight:      Height:        Wt Readings from Last 3 Encounters:  09/06/20 (!) 140.7 kg  05/22/20 (!) 138.8 kg  04/23/20 (!) 141.5 kg     Intake/Output Summary (Last 24 hours) at 09/11/2020 0953 Last data filed at 09/11/2020 0700 Gross per 24 hour  Intake 1006.33 ml  Output 1285 ml  Net -278.67 ml     Physical Exam  Awake Alert, No new F.N deficits, Normal affect [place].AT,PERRAL Supple Neck,No JVD, No cervical lymphadenopathy appriciated.  Symmetrical Chest wall movement, Good air movement bilaterally, CTAB RRR,No Gallops,Rubs or new Murmurs, No Parasternal Heave +ve B.Sounds, Abd Soft, No tenderness, No organomegaly appriciated, No rebound - guarding or rigidity. No Cyanosis, Clubbing or edema, No new Rash or bruise       Data Review:    CBC Recent Labs  Lab 09/05/20 1501 09/06/20 0504  09/08/20 1839 09/11/20 0626  WBC 7.8 7.0 6.8 10.1  HGB 15.0 14.8 15.0 16.2  HCT 46.4 45.2 45.6 48.4  PLT 184 187 235 263  MCV 96.5 95.4 93.6 92.4  MCH 31.2 31.2 30.8 30.9  MCHC 32.3 32.7 32.9 33.5  RDW 14.3 14.1 13.6 13.6  LYMPHSABS  --   --  1.5  --   MONOABS  --   --  0.7  --   EOSABS  --   --  0.2  --   BASOSABS  --   --  0.0  --     Recent Labs  Lab 09/05/20 1501 09/05/20 1944 09/06/20 0504 09/07/20 1945 09/08/20 1839 09/11/20 0626  NA 137  --  137  --  136 135  K 4.0  --  4.3  --  4.1 4.1  CL 103  --  103  --  102 101  CO2 26  --  20*  --  25 22  GLUCOSE 102*  --  203*  --  110* 136*  BUN 25*  --  23  --  30* 30*  CREATININE 1.80*  --  1.50*  --  1.56* 1.79*  CALCIUM 8.6*  --  8.2*  --  8.8* 8.5*  MG  --  2.3  --   --   --  2.3  HGBA1C  --   --   --  5.9*  --   --   BNP  --  130.4*  --   --   --  17.1    ------------------------------------------------------------------------------------------------------------------ No results for input(s): CHOL, HDL, LDLCALC, TRIG, CHOLHDL, LDLDIRECT in the last 72 hours.  Lab Results  Component Value Date   HGBA1C 5.9 (H) 09/07/2020   ------------------------------------------------------------------------------------------------------------------ No results for input(s): TSH, T4TOTAL, T3FREE, THYROIDAB in the last 72 hours.  Invalid input(s): FREET3  Cardiac Enzymes No results for input(s): CKMB, TROPONINI, MYOGLOBIN in the last 168 hours.  Invalid input(s): CK ------------------------------------------------------------------------------------------------------------------    Component Value Date/Time   BNP 17.1 09/11/2020 1610    Micro Results Recent Results (from the past 240 hour(s))  Resp Panel by RT-PCR (Flu A&B, Covid) Nasopharyngeal Swab     Status: Abnormal   Collection Time: 09/05/20 10:46 PM   Specimen: Nasopharyngeal Swab; Nasopharyngeal(NP) swabs in vial transport medium  Result Value Ref Range  Status   SARS Coronavirus 2 by RT PCR POSITIVE (A) NEGATIVE Final    Comment: RESULT CALLED TO, READ BACK BY AND VERIFIED WITH: RN CHRIS K. [id number] FCP (NOTE) SARS-CoV-2  target nucleic acids are DETECTED.  The SARS-CoV-2 RNA is generally detectable in upper respiratory specimens during the acute phase of infection. Positive results are indicative of the presence of the identified virus, but do not rule out bacterial infection or co-infection with other pathogens not detected by the test. Clinical correlation with patient history and other diagnostic information is necessary to determine patient infection status. The expected result is Negative.  Fact Sheet for Patients: BloggerCourse.com  Fact Sheet for Healthcare Providers: SeriousBroker.it  This test is not yet approved or cleared by the [place] FDA and  has been authorized for detection and/or diagnosis of SARS-CoV-2 by FDA under an Emergency Use Authorization (EUA).  This EUA will remain in effect (meaning this test can be used)  for the duration of  the COVID-19 declaration under Section 564(b)(1) of the Act, 21 U.S.C. section 360bbb-3(b)(1), unless the authorization is terminated or revoked sooner.     Influenza A by PCR NEGATIVE NEGATIVE Final   Influenza B by PCR NEGATIVE NEGATIVE Final    Comment: (NOTE) The Xpert Xpress SARS-CoV-2/FLU/RSV plus assay is intended as an aid in the diagnosis of influenza from Nasopharyngeal swab specimens and should not be used as a sole basis for treatment. Nasal washings and aspirates are unacceptable for Xpert Xpress SARS-CoV-2/FLU/RSV testing.  Fact Sheet for Patients: BloggerCourse.com  Fact Sheet for Healthcare Providers: SeriousBroker.it  This test is not yet approved or cleared by the [place] FDA and has been authorized for detection and/or diagnosis of  SARS-CoV-2 by FDA under an Emergency Use Authorization (EUA). This EUA will remain in effect (meaning this test can be used) for the duration of the COVID-19 declaration under Section 564(b)(1) of the Act, 21 U.S.C. section 360bbb-3(b)(1), unless the authorization is terminated or revoked.  Performed at [facility], [address] [place], [place] [address]     Radiology Reports DG Chest 1 View  Result Date: 09/09/2020 CLINICAL DATA:  Dyspnea EXAM: CHEST  1 VIEW COMPARISON:  09/08/2020, 04/23/2020 FINDINGS: Mild elevation of the right hemidiaphragm is unchanged. Interstitial thickening within the left lung base is stable, most in keeping with interstitial scarring. Lungs are otherwise clear. No pneumothorax or pleural effusion. Cardiac size within normal limits. Pulmonary vascularity is normal. No acute bone abnormality. IMPRESSION: No active disease.  Stable elevation of the right hemidiaphragm. Electronically Signed   By: Helyn Numbers MD   On: 09/09/2020 22:11   DG Chest 1 View  Result Date: 09/05/2020 CLINICAL DATA:  Chest pain EXAM: CHEST  1 VIEW COMPARISON:  April 23, 2020 FINDINGS: Mild elevation of right hemidiaphragm is stable. There is no edema or airspace opacity. Heart is upper normal in size with pulmonary vascularity normal. Patient is status post aortic valve replacement. No evident adenopathy. No pneumothorax. No bone lesions. IMPRESSION: Stable elevation of right hemidiaphragm. No edema or airspace opacity. Stable cardiac silhouette. Status post aortic valve replacement. Electronically Signed   By: Bretta Bang III M.D.   On: 09/05/2020 16:01   CT Head Wo Contrast  Result Date: 09/05/2020 CLINICAL DATA:  Left-sided headache and dizziness EXAM: CT HEAD WITHOUT CONTRAST TECHNIQUE: Contiguous axial images were obtained from the base of the skull through the vertex without intravenous contrast. COMPARISON:  MRI head 04/23/2020 FINDINGS: Brain: Generalized atrophy most  prominent the frontal lobes. Enlargement of the frontal horns due to atrophy. Mild patchy white matter hypodensity. Negative for acute infarct, hemorrhage, or mass lesion. Vascular: Negative for hyperdense vessel Skull: Negative  Sinuses/Orbits: Mild mucosal edema left maxillary sinus. No orbital mass. Other: None IMPRESSION: Frontal lobe atrophy and mild white matter changes. No acute abnormality. Electronically Signed   By: Marlan Palau M.D.   On: 09/05/2020 18:56   DG Chest Port 1 View  Result Date: 09/11/2020 CLINICAL DATA:  Shortness of breath.  History of COVID. EXAM: PORTABLE CHEST 1 VIEW COMPARISON:  09/09/2020.  09/05/2020.  02/22/2018. FINDINGS: Prior cardiac valve replacement. Heart size stable. No pulmonary venous congestion. Stable elevation right hemidiaphragm with stable right base atelectasis. Stable bibasilar chronic interstitial changes. No pleural effusion or pneumothorax. No acute bony abnormality. IMPRESSION: 1. Prior cardiac valve replacement. Heart size stable. No pulmonary venous congestion. 2. Stable elevation right hemidiaphragm with stable right base atelectasis. Stable bibasilar chronic interstitial changes. Electronically Signed   By: Maisie Fus  Register   On: 09/11/2020 06:34   DG CHEST PORT 1 VIEW  Result Date: 09/08/2020 CLINICAL DATA:  Shortness of breath EXAM: PORTABLE CHEST 1 VIEW COMPARISON:  Chest radiograph dated 09/05/2020. FINDINGS: The heart is enlarged. Vascular calcifications are seen in the aortic arch. The right hemidiaphragm is elevated relative to the left, unchanged. There is mild left basilar atelectasis. The right lung is clear. There is no significant pleural effusion or pneumothorax. Degenerative changes are seen in the spine. IMPRESSION: Mild left basilar atelectasis. Aortic Atherosclerosis (ICD10-I70.0). Electronically Signed   By: Romona Curls M.D.   On: 09/08/2020 18:43

## 2020-09-12 LAB — CBC WITH DIFFERENTIAL/PLATELET
Abs Immature Granulocytes: 0.08 10*3/uL — ABNORMAL HIGH (ref 0.00–0.07)
Basophils Absolute: 0 10*3/uL (ref 0.0–0.1)
Basophils Relative: 0 %
Eosinophils Absolute: 0.3 10*3/uL (ref 0.0–0.5)
Eosinophils Relative: 3 %
HCT: 44.8 % (ref 39.0–52.0)
Hemoglobin: 14.6 g/dL (ref 13.0–17.0)
Immature Granulocytes: 1 %
Lymphocytes Relative: 21 %
Lymphs Abs: 2.2 10*3/uL (ref 0.7–4.0)
MCH: 30.5 pg (ref 26.0–34.0)
MCHC: 32.6 g/dL (ref 30.0–36.0)
MCV: 93.7 fL (ref 80.0–100.0)
Monocytes Absolute: 1.1 10*3/uL — ABNORMAL HIGH (ref 0.1–1.0)
Monocytes Relative: 11 %
Neutro Abs: 6.6 10*3/uL (ref 1.7–7.7)
Neutrophils Relative %: 64 %
Platelets: 277 10*3/uL (ref 150–400)
RBC: 4.78 MIL/uL (ref 4.22–5.81)
RDW: 13.4 % (ref 11.5–15.5)
WBC: 10.3 10*3/uL (ref 4.0–10.5)
nRBC: 0 % (ref 0.0–0.2)

## 2020-09-12 LAB — COMPREHENSIVE METABOLIC PANEL
ALT: 15 U/L (ref 0–44)
AST: 14 U/L — ABNORMAL LOW (ref 15–41)
Albumin: 3.4 g/dL — ABNORMAL LOW (ref 3.5–5.0)
Alkaline Phosphatase: 78 U/L (ref 38–126)
Anion gap: 9 (ref 5–15)
BUN: 34 mg/dL — ABNORMAL HIGH (ref 8–23)
CO2: 26 mmol/L (ref 22–32)
Calcium: 8.7 mg/dL — ABNORMAL LOW (ref 8.9–10.3)
Chloride: 99 mmol/L (ref 98–111)
Creatinine, Ser: 1.76 mg/dL — ABNORMAL HIGH (ref 0.61–1.24)
GFR, Estimated: 40 mL/min — ABNORMAL LOW (ref >60)
Glucose, Bld: 115 mg/dL — ABNORMAL HIGH (ref 70–99)
Potassium: 3.9 mmol/L (ref 3.5–5.1)
Sodium: 134 mmol/L — ABNORMAL LOW (ref 135–145)
Total Bilirubin: 1.4 mg/dL — ABNORMAL HIGH (ref 0.3–1.2)
Total Protein: 6.8 g/dL (ref 6.5–8.1)

## 2020-09-12 LAB — MAGNESIUM: Magnesium: 2.4 mg/dL (ref 1.7–2.4)

## 2020-09-12 LAB — BRAIN NATRIURETIC PEPTIDE: B Natriuretic Peptide: 14.7 pg/mL (ref 0.0–100.0)

## 2020-09-12 MED ORDER — LACTATED RINGERS IV BOLUS
1000.0000 mL | Freq: Once | INTRAVENOUS | Status: AC
  Administered 2020-09-12: 1000 mL via INTRAVENOUS

## 2020-09-12 MED ORDER — LACTATED RINGERS IV BOLUS: 500.0000 mL | Freq: Once | INTRAVENOUS | Status: DC

## 2020-09-12 MED ORDER — AMIODARONE HCL 200 MG PO TABS: ORAL_TABLET | ORAL | 0 refills | Status: DC

## 2020-09-12 NOTE — Discharge Summary (Addendum)
Jorge [name] WER:[id number] DOB: 07-05-1945 DOA: 09/05/2020  PCP: [name], PA-C  Admit date: 09/05/2020  Discharge date: 09/12/2020  Admitted From: Home   Disposition:  Home   Recommendations for Outpatient Follow-up:   Follow up with PCP in 1-2 weeks  PCP Please obtain BMP/CBC, 2 view CXR in 1week,  (see Discharge instructions)   PCP Please follow up on the following pending results:    Home Health:  PT Equipment/Devices: None Consultations: Cards Discharge Condition: Stable    CODE STATUS: Full    Diet Recommendation: Heart Healthy   Diet Order            Diet - low sodium heart healthy           Diet Heart Room service appropriate? Yes; Fluid consistency: Thin  Diet effective now                  Chief Complaint  Patient presents with  . Chest Pain     Brief history of present illness from the day of admission and additional interim summary    Jorge [name] a 75 y.o.malewith medical history significant ofparoxysmal atrial fibrillation, chronic kidney disease stage III, diastolic heart failure, peripheral vascular disease, essential hypertension, history of due to aortic valve replacement, anxiety disorder, history of asthma who presented to the ER initially with 3 days of shortness of breath chest discomfort as well as weakness. In the ER patient was found to be in atrial fibrillation with rapid ventricular response, cardioversion not succesful. He was also found to be COVID positive.He underwent MAB infusion, remains on RA and reports improving. He was started on IV amiodarone for atrial fibrillation, current in sinus .                                                                  Hospital Course   1. Mild and Incidental COVID 19 Virus infection:  MAB infusion given by previous  team, currently not short of breath, encouraged to sit up in chair use I-S and flutter valve for pulmonary toiletry, chest x-ray stable and clinically asymptomatic on room air.   2.  Paroxysmal atrial fib with RVR, [place] vas 2 score of > 3.  Cardiology was on board and he was placed on IV amiodarone, now in sinus and transition to oral amiodarone Case discussed with cardiologist Dr. Eden Emms he will be discharged on oral amiodarone along with Eliquis with outpatient cardiology follow-up, continue Eliquis.  Recent echo shows a preserved EF of 60%.    3. Chronic diastolic heart failure: He appears to be compensated.   4. H/o CVA , carotid artery stenosis: S/p redo transcatheter placement of left carotid stent with angioplasty. Continue with aspirin, statin and eliquis as per neurology recommendations.   5. Stage  3b LDJ:[id number] stable around 1.8  6. H/o TAVR; Cardiology was on board. Outpatient follow up.  7.  Body mass index is 40.92 kg/m. Obesity: Outpatient follow up with PCP.   8. Essential hypertension - BP soft only on low-dose beta-blocker now ACE inhibitor held.  9. OSA - On CPAP At home.   10. Hypothyroidism: on synthroid.    Discharge diagnosis     Principal Problem:   Rapid atrial fibrillation [facility]) Active Problems:   Aortic stenosis   CAD (coronary artery disease)   HLD (hyperlipidemia)   Essential hypertension   History of cerebrovascular accident (CVA) with residual deficit   OSA on CPAP   Acute on chronic diastolic congestive heart failure (HCC)   Carotid artery stenosis   CKD (chronic kidney disease) stage 3, GFR 30-59 ml/min (HCC)   COVID-19 virus infection    Discharge instructions    Discharge Instructions    Diet - low sodium heart healthy   Complete by: As directed    Discharge instructions   Complete by: As directed    Follow with Primary MD [name], PA-C in 7 days   Get CBC, CMP, 2 view Chest X ray -  checked next visit within  1 week by Primary MD    Activity: As tolerated with Full fall precautions use walker/cane & assistance as needed  Disposition Home    Diet: Heart Healthy    Special Instructions: If you have smoked or chewed Tobacco  in the last 2 yrs please stop smoking, stop any regular Alcohol  and or any Recreational drug use.  On your next visit with your primary care physician please Get Medicines reviewed and adjusted.  Please request your Prim.MD to go over all Hospital Tests and Procedure/Radiological results at the follow up, please get all Hospital records sent to your Prim MD by signing hospital release before you go home.  If you experience worsening of your admission symptoms, develop shortness of breath, life threatening emergency, suicidal or homicidal thoughts you must seek medical attention immediately by calling 911 or calling your MD immediately  if symptoms less severe.  You Must read complete instructions/literature along with all the possible adverse reactions/side effects for all the Medicines you take and that have been prescribed to you. Take any new Medicines after you have completely understood and accpet all the possible adverse reactions/side effects.   Increase activity slowly   Complete by: As directed       Discharge Medications   Allergies as of 09/12/2020   No Known Allergies     Medication List    STOP taking these medications   lisinopril 10 MG tablet Commonly known as: ZESTRIL   oxybutynin 10 MG 24 hr tablet Commonly known as: DITROPAN-XL     TAKE these medications   acetaminophen 650 MG CR tablet Commonly known as: TYLENOL Take 1,300 mg by mouth every 8 (eight) hours as needed for pain.   amiodarone 200 MG tablet Commonly known as: PACERONE Take 1 pill twice a day for 6 days then 1 pill once a day   apixaban 5 MG Tabs tablet Commonly known as: ELIQUIS Take 1 tablet (5 mg total) by mouth 2 (two) times daily.   aspirin EC 81 MG tablet Take 1  tablet (81 mg total) by mouth daily.   atorvastatin 40 MG tablet Commonly known as: LIPITOR Take 40 mg by mouth at bedtime.   furosemide 20 MG tablet Commonly known as: LASIX Take 20 mg by  mouth daily.   gabapentin 600 MG tablet Commonly known as: NEURONTIN Take 600 mg by mouth in the morning and at bedtime.   levothyroxine 50 MCG tablet Commonly known as: SYNTHROID Take 50 mcg by mouth daily before breakfast.   metoprolol tartrate 25 MG tablet Commonly known as: LOPRESSOR Take 25 mg by mouth 2 (two) times daily.   nitroGLYCERIN 0.4 MG SL tablet Commonly known as: NITROSTAT Place 1 tablet (0.4 mg total) under the tongue every 5 (five) minutes as needed for chest pain.   omeprazole 40 MG capsule Commonly known as: PRILOSEC Take 40 mg by mouth at bedtime.   polyethylene glycol powder 17 GM/SCOOP powder Commonly known as: GLYCOLAX/MIRALAX Take 17 g by mouth daily. What changed:   when to take this  reasons to take this        Follow-up Information    [name], PA-C. Schedule an appointment as soon as possible for a visit in 1 week(s).   Specialty: Physician Assistant Contact information: [address] [place] [address] [phone]        Wendall Stade, MD. Schedule an appointment as soon as possible for a visit in 1 week(s).   Specialty: Cardiology Contact information: [address] [place] [address] [phone]               Major procedures and Radiology Reports - PLEASE review detailed and final reports thoroughly  -       DG Chest 1 View  Result Date: 09/09/2020 CLINICAL DATA:  Dyspnea EXAM: CHEST  1 VIEW COMPARISON:  09/08/2020, 04/23/2020 FINDINGS: Mild elevation of the right hemidiaphragm is unchanged. Interstitial thickening within the left lung base is stable, most in keeping with interstitial scarring. Lungs are otherwise clear. No pneumothorax or pleural effusion. Cardiac size within normal limits.  Pulmonary vascularity is normal. No acute bone abnormality. IMPRESSION: No active disease.  Stable elevation of the right hemidiaphragm. Electronically Signed   By: Helyn Numbers MD   On: 09/09/2020 22:11   DG Chest 1 View  Result Date: 09/05/2020 CLINICAL DATA:  Chest pain EXAM: CHEST  1 VIEW COMPARISON:  April 23, 2020 FINDINGS: Mild elevation of right hemidiaphragm is stable. There is no edema or airspace opacity. Heart is upper normal in size with pulmonary vascularity normal. Patient is status post aortic valve replacement. No evident adenopathy. No pneumothorax. No bone lesions. IMPRESSION: Stable elevation of right hemidiaphragm. No edema or airspace opacity. Stable cardiac silhouette. Status post aortic valve replacement. Electronically Signed   By: Bretta Bang III M.D.   On: 09/05/2020 16:01   CT Head Wo Contrast  Result Date: 09/05/2020 CLINICAL DATA:  Left-sided headache and dizziness EXAM: CT HEAD WITHOUT CONTRAST TECHNIQUE: Contiguous axial images were obtained from the base of the skull through the vertex without intravenous contrast. COMPARISON:  MRI head 04/23/2020 FINDINGS: Brain: Generalized atrophy most prominent the frontal lobes. Enlargement of the frontal horns due to atrophy. Mild patchy white matter hypodensity. Negative for acute infarct, hemorrhage, or mass lesion. Vascular: Negative for hyperdense vessel Skull: Negative Sinuses/Orbits: Mild mucosal edema left maxillary sinus. No orbital mass. Other: None IMPRESSION: Frontal lobe atrophy and mild white matter changes. No acute abnormality. Electronically Signed   By: Marlan Palau M.D.   On: 09/05/2020 18:56   DG Chest Port 1 View  Result Date: 09/11/2020 CLINICAL DATA:  Shortness of breath.  History of COVID. EXAM: PORTABLE CHEST 1 VIEW COMPARISON:  09/09/2020.  09/05/2020.  02/22/2018. FINDINGS: Prior cardiac valve replacement. Heart size stable. No pulmonary venous congestion. Stable elevation right hemidiaphragm with  stable right base atelectasis. Stable bibasilar chronic interstitial changes. No pleural effusion or pneumothorax. No acute bony abnormality. IMPRESSION: 1. Prior cardiac valve replacement. Heart size stable. No pulmonary venous congestion. 2. Stable elevation right hemidiaphragm with stable right base atelectasis. Stable bibasilar chronic interstitial changes. Electronically Signed   By: Maisie Fus  Register   On: 09/11/2020 06:34   DG CHEST PORT 1 VIEW  Result Date: 09/08/2020 CLINICAL DATA:  Shortness of breath EXAM: PORTABLE CHEST 1 VIEW COMPARISON:  Chest radiograph dated 09/05/2020. FINDINGS: The heart is enlarged. Vascular calcifications are seen in the aortic arch. The right hemidiaphragm is elevated relative to the left, unchanged. There is mild left basilar atelectasis. The right lung is clear. There is no significant pleural effusion or pneumothorax. Degenerative changes are seen in the spine. IMPRESSION: Mild left basilar atelectasis. Aortic Atherosclerosis (ICD10-I70.0). Electronically Signed   By: Romona Curls M.D.   On: 09/08/2020 18:43    Micro Results     Recent Results (from the past 240 hour(s))  Resp Panel by RT-PCR (Flu A&B, Covid) Nasopharyngeal Swab     Status: Abnormal   Collection Time: 09/05/20 10:46 PM   Specimen: Nasopharyngeal Swab; Nasopharyngeal(NP) swabs in vial transport medium  Result Value Ref Range Status   SARS Coronavirus 2 by RT PCR POSITIVE (A) NEGATIVE Final    Comment: RESULT CALLED TO, READ BACK BY AND VERIFIED WITH: RN CHRIS K. [id number] FCP (NOTE) SARS-CoV-2 target nucleic acids are DETECTED.  The SARS-CoV-2 RNA is generally detectable in upper respiratory specimens during the acute phase of infection. Positive results are indicative of the presence of the identified virus, but do not rule out bacterial infection or co-infection with other pathogens not detected by the test. Clinical correlation with patient history and other diagnostic information  is necessary to determine patient infection status. The expected result is Negative.  Fact Sheet for Patients: BloggerCourse.com  Fact Sheet for Healthcare Providers: SeriousBroker.it  This test is not yet approved or cleared by the [place] FDA and  has been authorized for detection and/or diagnosis of SARS-CoV-2 by FDA under an Emergency Use Authorization (EUA).  This EUA will remain in effect (meaning this test can be used)  for the duration of  the COVID-19 declaration under Section 564(b)(1) of the Act, 21 U.S.C. section 360bbb-3(b)(1), unless the authorization is terminated or revoked sooner.     Influenza A by PCR NEGATIVE NEGATIVE Final   Influenza B by PCR NEGATIVE NEGATIVE Final    Comment: (NOTE) The Xpert Xpress SARS-CoV-2/FLU/RSV plus assay is intended as an aid in the diagnosis of influenza from Nasopharyngeal swab specimens and should not be used as a sole basis for treatment. Nasal washings and aspirates are unacceptable for Xpert Xpress SARS-CoV-2/FLU/RSV testing.  Fact Sheet for Patients: BloggerCourse.com  Fact Sheet for Healthcare Providers: SeriousBroker.it  This test is not yet approved or cleared by the [place] FDA and has been authorized for detection and/or diagnosis of SARS-CoV-2 by FDA under an Emergency Use Authorization (EUA). This EUA will remain in effect (meaning this test can be used) for the duration of the COVID-19 declaration under Section 564(b)(1) of the Act, 21 U.S.C. section 360bbb-3(b)(1), unless the authorization is terminated or revoked.  Performed at [facility], [address] [place], [place] [address]     Today   Subjective    Jorge [name] today  has no headache,no chest abdominal pain,no new weakness tingling or numbness, feels much better wants to go home today.     Objective   Blood pressure (!)  122/47, pulse 66, temperature 97.7 F (36.5 C), temperature source Oral, resp. rate 20, height 6\' 1"  (1.854 m), weight (!) 140.7 kg, SpO2 98 %.   Intake/Output Summary (Last 24 hours) at 09/12/2020 1056 Last data filed at 09/12/2020 0557 Gross per 24 hour  Intake 655.45 ml  Output 1750 ml  Net -1094.55 ml    Exam  Awake Alert, No new F.N deficits, Normal affect [place].AT,PERRAL Supple Neck,No JVD, No cervical lymphadenopathy appriciated.  Symmetrical Chest wall movement, Good air movement bilaterally, CTAB RRR,No Gallops,Rubs or new Murmurs, No Parasternal Heave +ve B.Sounds, Abd Soft, Non tender, No organomegaly appriciated, No rebound -guarding or rigidity. No Cyanosis, Clubbing or edema, No new Rash or bruise   Data Review   CBC w Diff:  Lab Results  Component Value Date   WBC 10.3 09/12/2020   HGB 14.6 09/12/2020   HGB 14.2 01/14/2017   HCT 44.8 09/12/2020   HCT 42.3 01/14/2017   PLT 277 09/12/2020   PLT 242 01/14/2017   LYMPHOPCT 21 09/12/2020   MONOPCT 11 09/12/2020   EOSPCT 3 09/12/2020   BASOPCT 0 09/12/2020    CMP:  Lab Results  Component Value Date   NA 134 (L) 09/12/2020   NA 142 01/14/2017   K 3.9 09/12/2020   CL 99 09/12/2020   CO2 26 09/12/2020   BUN 34 (H) 09/12/2020   BUN 18 01/14/2017   CREATININE 1.76 (H) 09/12/2020   PROT 6.8 09/12/2020   ALBUMIN 3.4 (L) 09/12/2020   BILITOT 1.4 (H) 09/12/2020   ALKPHOS 78 09/12/2020   AST 14 (L) 09/12/2020   ALT 15 09/12/2020  .   Total Time in preparing paper work, data evaluation and todays exam - 35 minutes  Susa RaringPrashant Nalanie Winiecki M.D on 09/12/2020 at 10:56 AM  [facility]

## 2020-09-12 NOTE — TOC Progression Note (Signed)
Transition of Care [facility]) - Progression Note    Patient Details  Name: Jorge [name] MRN: [id number] Date of Birth: 01/02/1946  Transition of Care [facility]) CM/SW Contact  Graves-Bigelow, Lamar Laundry, RN Phone Number: 09/12/2020, 5:00 PM  Clinical Narrative:  Case Manager received a call from [facility] liaison and the [facility]. office was unable to accept the patient. Case Manager then called [facility] and they can service the patient with all disciplines, PT,OT, Aide, SW, RN. Patient will need home health orders and F2F. Start of care to begin within 24-48 hours of transition home. Case Manager will continue to follow for additional toc needs.     Expected Discharge Plan: Home w Home Health Services Barriers to Discharge: Continued Medical Work up  Expected Discharge Plan and Services Expected Discharge Plan: Home w Home Health Services In-house Referral: Clinical Social Work Discharge Planning Services: CM Consult Post Acute Care Choice: Home Health Living arrangements for the past 2 months: Apartment (Per son the apartment is handicapp equipped that has call buttons [place] [employer].)                 DME Arranged: N/A DME Agency: NA       HH Arranged: RN,Disease Management,PT,OT,Social Work,Refused SNF,Nurse's Aide HH Agency: [facility] ([facility])-declined Date HH Agency Contacted: 09/12/20 Time HH Agency Contacted: 1659 Representative spoke with at [facility] Agency: Cory   Readmission Risk Interventions Readmission Risk Prevention Plan 04/26/2020  Post Dischage Appt Complete  Medication Screening Complete  Transportation Screening Complete  Some recent data might be hidden

## 2020-09-12 NOTE — Progress Notes (Signed)
Physical Therapy Treatment Patient Details Name: Jorge [name] MRN: [id number] DOB: May 09, 1945 Today's Date: 09/12/2020    History of Present Illness 75 y.o. male presenting to ER on 09/05/2020 initally with 3 days of SOB, chest discomfort, and weakness. In the ER, pt found to be in a fib with RVR. While waiting for admission pt is COVID positive, likely triggering a fib with RVR. PMH includes paroxysmal atrial fibrillation, chronic kidney disease stage III, diastolic heart failure, peripheral vascular disease, essential hypertension, history of due to aortic valve replacement, anxiety disorder, history of asthma.    PT Comments    Pt tolerates treatment well without reports of SOB and with stable HR. Pt is able to ambulate for increased distances and does not experience any losses of balance this session. PT in agreement with pt on continuing use of wheelchair for mobility on uneven surfaces as the pt does have multiple chronic gait deficits and has been utilizing this technique at baseline. Pt will benefit from continued aggressive mobilization and PT POC to improve activity tolerance and reduce falls risk.  Follow Up Recommendations  Home health PT;Supervision - Intermittent     Equipment Recommendations  None recommended by PT    Recommendations for Other Services       Precautions / Restrictions Precautions Precautions: Fall Restrictions Weight Bearing Restrictions: No    Mobility  Bed Mobility Overal bed mobility: Independent                  Transfers Overall transfer level: Needs assistance Equipment used: Straight cane Transfers: Sit to/from Stand Sit to Stand: Supervision            Ambulation/Gait Ambulation/Gait assistance: Min guard Gait Distance (Feet): 80 Feet Assistive device: Straight cane Gait Pattern/deviations: Step-through pattern;Wide base of support Gait velocity: reduced Gait velocity interpretation: 1.31 - 2.62 ft/sec, indicative of  limited community ambulator General Gait Details: pt with slowed step-through gait, widened BOS, "slow and steady" per patient   Stairs             Wheelchair Mobility    Modified Rankin (Stroke Patients Only)       Balance Overall balance assessment: Needs assistance Sitting-balance support: No upper extremity supported;Feet supported Sitting balance-Leahy Scale: Good     Standing balance support: Single extremity supported Standing balance-Leahy Scale: Poor Standing balance comment: reliant on UE support of cane                            Cognition Arousal/Alertness: Awake/alert Behavior During Therapy: WFL for tasks assessed/performed Overall Cognitive Status: Within Functional Limits for tasks assessed                                        Exercises      General Comments General comments (skin integrity, edema, etc.): VSS on RA      Pertinent Vitals/Pain Pain Assessment: No/denies pain    Home Living                      Prior Function            PT Goals (current goals can now be found in the care plan section) Acute Rehab PT Goals Patient Stated Goal: to go home and return to baseline Progress towards PT goals: Progressing toward goals    Frequency  Min 3X/week      PT Plan Current plan remains appropriate    Co-evaluation              AM-PAC PT "6 Clicks" Mobility   Outcome Measure  Help needed turning from your back to your side while in a flat bed without using bedrails?: None Help needed moving from lying on your back to sitting on the side of a flat bed without using bedrails?: None Help needed moving to and from a bed to a chair (including a wheelchair)?: A Little Help needed standing up from a chair using your arms (e.g., wheelchair or bedside chair)?: A Little Help needed to walk in hospital room?: A Little Help needed climbing 3-5 steps with a railing? : A Lot 6 Click Score:  19    End of Session   Activity Tolerance: Patient tolerated treatment well Patient left: in bed;with call bell/phone within reach;with bed alarm set Nurse Communication: Mobility status PT Visit Diagnosis: Unsteadiness on feet (R26.81);Muscle weakness (generalized) (M62.81);Difficulty in walking, not elsewhere classified (R26.2)     Time: 2409-7353 PT Time Calculation (min) (ACUTE ONLY): 14 min  Charges:  $Gait Training: 8-22 mins                     Arlyss Gandy, PT, DPT Acute Rehabilitation Pager: [phone]    Arlyss Gandy 09/12/2020, 5:56 PM
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
## 2020-09-12 NOTE — Discharge Instructions (Signed)
Follow with Primary MD [name], PA-C in 7 days   Get CBC, CMP, 2 view Chest X ray -  checked next visit within 1 week by Primary MD    Activity: As tolerated with Full fall precautions use walker/cane & assistance as needed  Disposition Home    Diet: Heart Healthy    Special Instructions: If you have smoked or chewed Tobacco  in the last 2 yrs please stop smoking, stop any regular Alcohol  and or any Recreational drug use.  On your next visit with your primary care physician please Get Medicines reviewed and adjusted.  Please request your Prim.MD to go over all Hospital Tests and Procedure/Radiological results at the follow up, please get all Hospital records sent to your Prim MD by signing hospital release before you go home.  If you experience worsening of your admission symptoms, develop shortness of breath, life threatening emergency, suicidal or homicidal thoughts you must seek medical attention immediately by calling 911 or calling your MD immediately  if symptoms less severe.  You Must read complete instructions/literature along with all the possible adverse reactions/side effects for all the Medicines you take and that have been prescribed to you. Take any new Medicines after you have completely understood and accpet all the possible adverse reactions/side effects.

## 2020-09-13 LAB — CBC WITH DIFFERENTIAL/PLATELET
Abs Immature Granulocytes: 0.09 10*3/uL — ABNORMAL HIGH (ref 0.00–0.07)
Basophils Absolute: 0.1 10*3/uL (ref 0.0–0.1)
Basophils Relative: 1 %
Eosinophils Absolute: 0.3 10*3/uL (ref 0.0–0.5)
Eosinophils Relative: 3 %
HCT: 48.3 % (ref 39.0–52.0)
Hemoglobin: 16 g/dL (ref 13.0–17.0)
Immature Granulocytes: 1 %
Lymphocytes Relative: 20 %
Lymphs Abs: 1.9 10*3/uL (ref 0.7–4.0)
MCH: 31 pg (ref 26.0–34.0)
MCHC: 33.1 g/dL (ref 30.0–36.0)
MCV: 93.6 fL (ref 80.0–100.0)
Monocytes Absolute: 1.1 10*3/uL — ABNORMAL HIGH (ref 0.1–1.0)
Monocytes Relative: 12 %
Neutro Abs: 6.1 10*3/uL (ref 1.7–7.7)
Neutrophils Relative %: 63 %
Platelets: 272 10*3/uL (ref 150–400)
RBC: 5.16 MIL/uL (ref 4.22–5.81)
RDW: 13.6 % (ref 11.5–15.5)
WBC: 9.6 10*3/uL (ref 4.0–10.5)
nRBC: 0 % (ref 0.0–0.2)

## 2020-09-13 LAB — COMPREHENSIVE METABOLIC PANEL
ALT: 15 U/L (ref 0–44)
AST: 14 U/L — ABNORMAL LOW (ref 15–41)
Albumin: 3.3 g/dL — ABNORMAL LOW (ref 3.5–5.0)
Alkaline Phosphatase: 74 U/L (ref 38–126)
Anion gap: 9 (ref 5–15)
BUN: 29 mg/dL — ABNORMAL HIGH (ref 8–23)
CO2: 24 mmol/L (ref 22–32)
Calcium: 8.9 mg/dL (ref 8.9–10.3)
Chloride: 102 mmol/L (ref 98–111)
Creatinine, Ser: 1.78 mg/dL — ABNORMAL HIGH (ref 0.61–1.24)
GFR, Estimated: 39 mL/min — ABNORMAL LOW (ref >60)
Glucose, Bld: 113 mg/dL — ABNORMAL HIGH (ref 70–99)
Potassium: 4.2 mmol/L (ref 3.5–5.1)
Sodium: 135 mmol/L (ref 135–145)
Total Bilirubin: 1.1 mg/dL (ref 0.3–1.2)
Total Protein: 6.8 g/dL (ref 6.5–8.1)

## 2020-09-13 LAB — MAGNESIUM: Magnesium: 2.4 mg/dL (ref 1.7–2.4)

## 2020-09-13 LAB — BRAIN NATRIURETIC PEPTIDE: B Natriuretic Peptide: 56.8 pg/mL (ref 0.0–100.0)

## 2020-09-13 MED ORDER — LACTATED RINGERS IV BOLUS: 500.0000 mL | Freq: Once | INTRAVENOUS | Status: DC

## 2020-09-13 NOTE — Evaluation (Signed)
Occupational Therapy Evaluation Patient Details Name: Jorge [name] MRN: [id number] DOB: 1945-10-12 Today's Date: 09/13/2020    History of Present Illness 75 y.o. male presenting to ER on 09/05/2020 initally with 3 days of SOB, chest discomfort, and weakness. In the ER, pt found to be in a fib with RVR. While waiting for admission pt is COVID positive, likely triggering a fib with RVR. PMH includes paroxysmal atrial fibrillation, chronic kidney disease stage III, diastolic heart failure, peripheral vascular disease, essential hypertension, history of due to aortic valve replacement, anxiety disorder, history of asthma.   Clinical Impression   Patient lives alone in a handicap accessible apartment, mod I at baseline with self care tasks using a cane indoors and scooter for outdoor mobility "I am a big guy and don't want to fall." Overall patient supervision to min guard for standing ADL tasks. Patient able to don shirt, underwear, pants, shoes and socks needing only min G assist to pull up underwear due to initial posterior lean on chair. Patient min G with cane ambulating to bathroom, did not require physical assistance on/off toilet and then supervision back to recliner. Patient expresses interest in working with [facility] to maximize is overall strength in order to be safe and minimize risk of falls "I know I am unsteady." Recommend continued acute OT services to facilitate D/C to venue listed below.    Follow Up Recommendations  Home health OT    Equipment Recommendations  None recommended by OT       Precautions / Restrictions Precautions Precautions: Fall Restrictions Weight Bearing Restrictions: No      Mobility Bed Mobility               General bed mobility comments: OOB    Transfers Overall transfer level: Needs assistance Equipment used: Straight cane Transfers: Sit to/from Stand Sit to Stand: Min guard         General transfer comment: with initial sit to stand  from recliner due to mild posterior lean    Balance Overall balance assessment: Needs assistance Sitting-balance support: Feet supported Sitting balance-Leahy Scale: Good     Standing balance support: Single extremity supported Standing balance-Leahy Scale: Poor Standing balance comment: reliant on UE support of cane                           ADL either performed or assessed with clinical judgement   ADL Overall ADL's : Needs assistance/impaired Eating/Feeding: Independent   Grooming: Supervision/safety;Standing   Upper Body Bathing: Set up;Sitting   Lower Body Bathing: Min guard;Sitting/lateral leans;Sit to/from stand   Upper Body Dressing : Set up;Sitting Upper Body Dressing Details (indicate cue type and reason): to don tee shirt Lower Body Dressing: Min guard;Sit to/from stand;Sitting/lateral leans Lower Body Dressing Details (indicate cue type and reason): min G in standing due to mild posterior lean against chair when pulling up LB clothing, otherwise no physical assistance Toilet Transfer: Supervision/safety;Min guard;Ambulation;Regular Toilet (cane) Toilet Transfer Details (indicate cue type and reason): initial min G ambulating to bathroom, progress to supervision ambulating back to recliner. did not need assist on/off toilet Toileting- Clothing Manipulation and Hygiene: Sitting/lateral lean;Sit to/from stand;Supervision/safety       Functional mobility during ADLs: Supervision/safety;Cane General ADL Comments: patient appears close to baseline with self care however reports can be hard at times due to hx back surgeries. does have reacher at home, has tried sock aids in the past however was able to don  socks using figure 4 method and increased time      Pertinent Vitals/Pain Pain Assessment: No/denies pain     Hand Dominance Left   Extremity/Trunk Assessment Upper Extremity Assessment Upper Extremity Assessment: Overall WFL for tasks assessed    Lower Extremity Assessment Lower Extremity Assessment: Defer to PT evaluation   Cervical / Trunk Assessment Cervical / Trunk Assessment: Normal   Communication Communication Communication: No difficulties   Cognition Arousal/Alertness: Awake/alert Behavior During Therapy: WFL for tasks assessed/performed Overall Cognitive Status: Within Functional Limits for tasks assessed                                                Home Living Family/patient expects to be discharged to:: Private residence Living Arrangements: Alone Available Help at Discharge: Family;Available PRN/intermittently Type of Home: Apartment Home Access: Ramped entrance     Home Layout: One level     Bathroom Shower/Tub: Tub/shower unit;Curtain   Bathroom Toilet: Handicapped height Bathroom Accessibility: Yes   Home Equipment: Cane - single point;Shower seat;Grab bars - toilet;Grab bars - tub/shower;Hand held shower head;Wheelchair - IT trainer;Other (comment)   Additional Comments: patient lives in handicap accessible apartment      Prior Functioning/Environment Level of Independence: Independent with assistive device(s)        Comments: pt ambulates in the home independently with intermittent UE support of furniture/walls. Pt utilizes cane for community mobility indoors and utilizes a power wheelchair for outdoor mobility        OT Problem List: Decreased activity tolerance;Impaired balance (sitting and/or standing);Decreased knowledge of use of DME or AE      OT Treatment/Interventions: Self-care/ADL training;Therapeutic exercise;DME and/or AE instruction;Therapeutic activities;Patient/family education;Balance training    OT Goals(Current goals can be found in the care plan section) Acute Rehab OT Goals Patient Stated Goal: home with HHOT OT Goal Formulation: With patient Time For Goal Achievement: 09/27/20 Potential to Achieve Goals: Good  OT Frequency: Min  2X/week    AM-PAC OT "6 Clicks" Daily Activity     Outcome Measure Help from another person eating meals?: None Help from another person taking care of personal grooming?: A Little Help from another person toileting, which includes using toliet, bedpan, or urinal?: A Little Help from another person bathing (including washing, rinsing, drying)?: A Little Help from another person to put on and taking off regular upper body clothing?: A Little Help from another person to put on and taking off regular lower body clothing?: A Little 6 Click Score: 19   End of Session Equipment Utilized During Treatment:  (cane) Nurse Communication: Mobility status  Activity Tolerance: Patient tolerated treatment well Patient left: in chair;with call bell/phone within reach  OT Visit Diagnosis: Unsteadiness on feet (R26.81)                Time: 2725-3664 OT Time Calculation (min): 29 min Charges:  OT General Charges $OT Visit: 1 Visit OT Evaluation $OT Eval Low Complexity: 1 Low OT Treatments $Self Care/Home Management : 8-22 mins  Marlyce Huge OT OT pager: [phone]  Carmelia Roller 09/13/2020, 12:54 PM

## 2020-09-13 NOTE — Progress Notes (Signed)
[facility]                                                                                                                                                                         Patient Demographics  Jorge [name], is a 75 y.o. male  OZH:[id number]  GEX:[id number]  DOB - 08/03/1945  Admit date - 09/05/2020  Admitting Physician Rometta Emery, MD  Outpatient Primary MD for the patient is [name], PA-C  LOS - 8   Chief Complaint  Patient presents with   Chest Pain        Assessment & Plan    Patient seen briefly today due for discharge soon per Discharge done yesterday by me, no further issues, Vital signs stable, patient feels fine after gentle IVF yesterday to augment BP.      Medications  Scheduled Meds:  amiodarone  200 mg Oral BID   apixaban  5 mg Oral BID   aspirin  81 mg Oral Daily   atorvastatin  40 mg Oral Daily   dextromethorphan-guaiFENesin  1 tablet Oral BID   fluticasone  2 spray Each Nare Daily   levothyroxine  50 mcg Oral Q0600   loratadine  10 mg Oral Daily   metoprolol tartrate  12.5 mg Oral BID   pantoprazole  40 mg Oral Daily   Continuous Infusions: PRN Meds:.acetaminophen, melatonin, menthol-cetylpyridinium, nitroGLYCERIN, ondansetron (ZOFRAN) IV, polyvinyl alcohol, traMADol    Time Spent in minutes   10 minutes   Susa Raring M.D on 09/13/2020 at 8:02 AM  Between 7am to 7pm - Pager - [phone]  After 7pm go to [email or web address] - password [email or web address]  And look for the night coverage person covering for me after hours  [facility] Office  [phone]    Subjective:   Jorge [name] today has, No headache, No chest pain, No abdominal pain - No Nausea, No new weakness tingling or numbness, No Cough - SOB  Objective:   Vitals:   09/12/20 1208 09/12/20 1542 09/12/20 2011 09/12/20 2326  BP: 100/61 (!) 122/43 124/63 (!) 116/52  Pulse: 62 60  96 87  Resp: 20 20 19 16   Temp: 97.9 F (36.6 C) 97.7 F (36.5 C) 98.2 F (36.8 C) 98.4 F (36.9 C)  TempSrc: Oral Oral Oral Oral  SpO2: 92% 100% 99% 100%  Weight:      Height:        Wt Readings from Last 3 Encounters:  09/06/20 (!) 140.7 kg  05/22/20 (!) 138.8 kg  04/23/20 (!) 141.5 kg     Intake/Output Summary (Last 24 hours) at 09/13/2020 0802 Last data filed at 09/13/2020 0744 Gross per 24 hour  Intake 240 ml  Output 2025 ml  Net -  1785 ml    Exam Awake Alert, Oriented X 3, No new F.N deficits, Normal affect [place].AT,PERRAL Supple Neck,No JVD, No cervical lymphadenopathy appriciated.  Symmetrical Chest wall movement, Good air movement bilaterally, CTAB RRR,No Gallops,Rubs or new Murmurs, No Parasternal Heave +ve B.Sounds, Abd Soft, Non tender, No organomegaly appriciated, No rebound - guarding or rigidity. No Cyanosis, Clubbing or edema, No new Rash or bruise      Data Review

## 2020-09-13 NOTE — Care Management Important Message (Signed)
Important Message  Patient Details  Name: Jorge [name] MRN: [id number] Date of Birth: 1945-08-12   Medicare Important Message Given:  Yes     Renie Ora 09/13/2020, 3:32 PM

## 2020-10-02 ENCOUNTER — Other Ambulatory Visit: Payer: Self-pay

## 2020-10-02 MED ORDER — AMIODARONE HCL 200 MG PO TABS: 200.0000 mg | ORAL_TABLET | Freq: Every day | ORAL | 0 refills | Status: DC

## 2020-10-21 NOTE — Progress Notes (Deleted)
Cardiology Office Note:    Date:  10/21/2020   ID:  Jorge [name], DOB 02/06/1946, MRN [id number]  PCP:  [name], PA-C   [name] Cardiologist:  Charlton Haws, MD { Click to update primary MD,subspecialty MD or APP then REFRESH:1}  ***  Referring MD: [name], PA-C   Chief Complaint:  No chief complaint on file.    Patient Profile:    Jorge [name] is a 75 y.o. male with:  Aortic stenosis S/p TAVR in 2018  Coronary artery disease Cath in 2018: non-obstructive dz (oLCx 40%) Paroxysmal atrial fibrillation  (HFpEF) heart failure with preserved ejection fraction  Carotid artery disease S/p L CEA in 2013 (Dr. Darrick Penna) S/p L TCAR 01/2018 due to restenosis  S/p redo L TCAR 04/2020 due to restenosis  S/p CVA w residual R hemparesis S/p L brain TIA 2019, 2022 Hypertension  Hyperlipidemia  Chronic kidney disease  Morbid obesity  OSA on CPAP COPD   Prior CV studies: *** VAS US CAROTID DUPLEX BILATERAL 05/02/2020 RICA 60-79 L CEA patent R subclavian stenosis with evidence of R subclavian steal syndrome   ECHOCARDIOGRAM 04/24/2020  1. Normal LV function; s/p TAVR with mean gradient of 13 mmHg, AVA 1.6  cm2 and no AI.   2. Left ventricular ejection fraction, by estimation, is 60 to 65%. The  left ventricle has normal function. The left ventricle has no regional  wall motion abnormalities. There is mild left ventricular hypertrophy.  Left ventricular diastolic parameters  are consistent with Grade II diastolic dysfunction (pseudonormalization).   3. Right ventricular systolic function is normal. The right ventricular  size is normal. Tricuspid regurgitation signal is inadequate for assessing  PA pressure.   4. Left atrial size was mildly dilated.   5. The mitral valve is normal in structure. No evidence of mitral valve  regurgitation. No evidence of mitral stenosis.   6. The aortic valve has been repaired/replaced. Aortic valve   regurgitation is not visualized. No aortic stenosis is present. There is a  Sapien prosthetic (TAVR) valve present in the aortic position.   7. The inferior vena cava is normal in size with greater than 50%  respiratory variability, suggesting right atrial pressure of 3 mmHg.   NM Myocar Multi W/Spect W/Wall Motion / EF 12/05/2017 IMPRESSION: 1. No reversible ischemia or infarction. 2. Normal left ventricular wall motion. 3. Left ventricular ejection fraction 64% 4. Non invasive risk stratification*: Low  RIGHT/LEFT HEART CATH AND CORONARY ANGIOGRAPHY 04/30/2016 Narrative  Ost Cx to Prox Cx lesion, 40 %stenosed.  There is severe aortic valve stenosis.  Hemodynamic findings consistent with aortic valve stenosis. 1. Mild non-obstructive CAD 2. Severe aortic stenosis (mean gradient 33.63mmHg, AVA 1.02 cm2).  Recommendations: It is likely that his syncope is due to his aortic stenosis. I will ask the primary team to call CT surgery in the am. He may not be a good candidate for surgical AVR. Dr. Cornelius Moras or Dr. Laneta Simmers should be asked to see him so they can discuss AVR vs TAVR. If he is not felt to be a surgical candidate, I will be glad to see him in consultation as well to discuss TAVR.    History of Present Illness: Jorge [name] was last seen by Dr. Eden Emms in 04/2018.  He was admitted 6/1-6/8 with symptomatic AF with RVR.  He underwent DCCV in the ED due to hypotension but this was unsuccessful.  He was placed on Amiodarone and had brief return to normal sinus  rhythm.  He was in and out of AFib at DC.  He was DC'd on Amiodarone and Apixaban.  He was incidentally found to be COVID-19 +.  He was treated w/ MAB infusion.  He had chest pain with neg Troponins.  No further workup was pursued.  He returns for f/u.  ***    Past Medical History:  Diagnosis Date   Anxiety    situational -    Asthma due to seasonal allergies    Atrial fibrillation (HCC)    pt on Eliquis   Chest pain    CHF  (congestive heart failure) (HCC)    Depression    situational   Dyspnea    when I walk a long distance   Dysrhythmia    Edema    GERD (gastroesophageal reflux disease)    ocassional   Heart murmur    HTN (hypertension)    Hyperlipidemia    Hypothyroidism    Neuropathy    Obesity    Peripheral vascular disease (HCC)    carotid    Renal insufficiency 12/04/2017   Sleep apnea    SOB (shortness of breath)    Stroke (HCC)    Right side weakness, using a cane    Current Medications: No outpatient medications have been marked as taking for the 10/22/20 encounter (Appointment) with Tereso Newcomer T, PA-C.     Allergies:   Patient has no known allergies.   Social History   Tobacco Use   Smoking status: Former   Smokeless tobacco: Never   Tobacco comments:    quit early 1980's   Vaping Use   Vaping Use: Never used  Substance Use Topics   Alcohol use: No   Drug use: No     Family Hx: The patient's family history includes Heart attack in his mother; Stroke in his father.  ROS   EKGs/Labs/Other Test Reviewed:    EKG:  EKG is *** ordered today.  The ekg ordered today demonstrates ***  Recent Labs: 09/13/2020: ALT 15; B Natriuretic Peptide 56.8; BUN 29; Creatinine, Ser 1.78; Hemoglobin 16.0; Magnesium 2.4; Platelets 272; Potassium 4.2; Sodium 135   Recent Lipid Panel Lab Results  Component Value Date/Time   CHOL 102 09/06/2020 05:04 AM   TRIG 67 09/06/2020 05:04 AM   HDL 37 (L) 09/06/2020 05:04 AM   LDLCALC 52 09/06/2020 05:04 AM      Risk Assessment/Calculations:   {Does this patient have ATRIAL FIBRILLATION?:[phone]}  Physical Exam:    VS:  There were no vitals taken for this visit.    Wt Readings from Last 3 Encounters:  09/06/20 (!) 310 lb 3 oz (140.7 kg)  05/22/20 (!) 306 lb (138.8 kg)  04/23/20 (!) 312 lb (141.5 kg)     Physical Exam ***     ASSESSMENT & PLAN:    {Select for Dx:25819}    {Are you ordering a CV Procedure (e.g. stress test,  cath, DCCV, TEE, etc)?   Press F2        :[id number]}    Dispo:  No follow-ups on file.   Medication Adjustments/Labs and Tests Ordered: Current medicines are reviewed at length with the patient today.  Concerns regarding medicines are outlined above.  Tests Ordered: No orders of the defined types were placed in this encounter.  Medication Changes: No orders of the defined types were placed in this encounter.   Signed, Tereso Newcomer, PA-C  10/21/2020 8:29 PM    [name] [address] [place], [place]  [address] Phone: [phone]; Fax: [phone]

## 2020-10-22 ENCOUNTER — Ambulatory Visit: Payer: Medicare Other | Admitting: Physician Assistant

## 2020-10-22 DIAGNOSIS — I779 Disorder of arteries and arterioles, unspecified: Secondary | ICD-10-CM

## 2020-10-22 DIAGNOSIS — Z952 Presence of prosthetic heart valve: Secondary | ICD-10-CM

## 2020-10-22 DIAGNOSIS — Z79899 Other long term (current) drug therapy: Secondary | ICD-10-CM

## 2020-10-22 DIAGNOSIS — I480 Paroxysmal atrial fibrillation: Secondary | ICD-10-CM

## 2020-10-22 DIAGNOSIS — N1832 Chronic kidney disease, stage 3b: Secondary | ICD-10-CM

## 2020-10-22 DIAGNOSIS — I350 Nonrheumatic aortic (valve) stenosis: Secondary | ICD-10-CM

## 2020-10-22 DIAGNOSIS — I5032 Chronic diastolic (congestive) heart failure: Secondary | ICD-10-CM

## 2020-10-22 DIAGNOSIS — E782 Mixed hyperlipidemia: Secondary | ICD-10-CM

## 2020-10-22 DIAGNOSIS — I10 Essential (primary) hypertension: Secondary | ICD-10-CM

## 2020-10-22 DIAGNOSIS — I2510 Atherosclerotic heart disease of native coronary artery without angina pectoris: Secondary | ICD-10-CM

## 2020-10-30 ENCOUNTER — Encounter (HOSPITAL_COMMUNITY): Payer: Self-pay

## 2020-12-03 ENCOUNTER — Telehealth: Payer: Self-pay | Admitting: Cardiovascular Disease

## 2020-12-03 MED ORDER — AMIODARONE HCL 200 MG PO TABS: 200.0000 mg | ORAL_TABLET | Freq: Every day | ORAL | 1 refills | Status: DC

## 2020-12-03 NOTE — Telephone Encounter (Signed)
*  STAT* If patient is at the pharmacy, call can be transferred to refill team.   1. Which medications need to be refilled? (please list name of each medication and dose if known)  amiodarone (PACERONE) 200 MG tablet  2. Which pharmacy/location (including street and city if local pharmacy) is medication to be sent to? [facility]  [facility]) - [place], [place] - [address] [place]  3. Do they need a 30 day or 90 day supply? 90 day supply

## 2020-12-03 NOTE — Telephone Encounter (Signed)
Duplicate phone note.

## 2020-12-03 NOTE — Telephone Encounter (Signed)
*  STAT* If patient is at the pharmacy, call can be transferred to refill team.   1. Which medications need to be refilled? (please list name of each medication and dose if known) amiodarone (PACERONE) 200 MG tablet  2. Which pharmacy/location (including street and city if local pharmacy) is medication to be sent to?  [facility]) - [place], [place] - [address]  3. Do they need a 30 day or 90 day supply? 30ds

## 2020-12-03 NOTE — Telephone Encounter (Signed)
Pt scheduled for office visit in January, 2023. Have sent in refill for Amiodarone to [facility] per pt request.

## 2021-01-09 ENCOUNTER — Ambulatory Visit (HOSPITAL_COMMUNITY): Admission: RE | Admit: 2021-01-09 | Payer: Medicare Other | Source: Ambulatory Visit

## 2021-01-13 ENCOUNTER — Other Ambulatory Visit: Payer: Self-pay

## 2021-01-13 DIAGNOSIS — I6523 Occlusion and stenosis of bilateral carotid arteries: Secondary | ICD-10-CM

## 2021-01-29 ENCOUNTER — Ambulatory Visit: Payer: Medicare Other | Admitting: Vascular Surgery

## 2021-01-29 ENCOUNTER — Encounter (HOSPITAL_COMMUNITY): Payer: Medicare Other

## 2021-02-19 ENCOUNTER — Encounter (HOSPITAL_COMMUNITY): Payer: Medicare Other

## 2021-02-19 ENCOUNTER — Ambulatory Visit: Payer: Medicare Other | Admitting: Vascular Surgery

## 2021-04-03 DIAGNOSIS — I771 Stricture of artery: Secondary | ICD-10-CM | POA: Insufficient documentation

## 2021-04-03 NOTE — Progress Notes (Deleted)
Cardiology Office Note:    Date:  04/03/2021   ID:  GIORGIO CHABOT, DOB [birth year], MRN [id number]  PCP:  [name], PA-C   [facility] Providers Cardiologist:  Charlton Haws, MD { Click to update primary MD,subspecialty MD or APP then REFRESH:1}  *** Referring MD: [name], PA-C   Chief Complaint:  No chief complaint on file. {Click here for Visit Info    :1}   Patient Profile:   Jorge [name] is a 75 y.o. male with:  Paroxysmal atrial fibrillation Previous Amiodarone Rx >> resumed for AF w RVR during admit 6/22 in setting of COVID-19 Aortic stenosis s/p TAVR in 1/18 Coronary artery disease Nonobstructive CAD by cath in 2018 (HFpEF) heart failure with preserved ejection fraction  Carotid artery disease History of left CEA S/p left TCAR stent in 2019 S/p redo left TCAR stent 1/22 (in setting of L brain TIA and restenosis of LICA site) History of CVA, TIA Peripheral arterial disease R subclavian stenosis  Chronic kidney disease Hypertension Hyperlipidemia Hypothyroidism OSA GERD Obesity  DNR  History of Present Illness: Mr. Holtsclaw was last seen by Dr. Eden Emms in January 2020.  He was admitted in 1/22 with recurrent L brain TIA and found to have severe restenosis of the LICA.  He underwent redo TCAR with stenting.  He was admitted again in 6/22 with AF w RVR in the setting of COVID-19.  He underwent DCCV due to hypotension but had ERAF.  He was managed with Amiodarone IV for rate control and transitioned to oral Amiodarone at DC.  He missed his appt in July 2022 and returns now for f/u. ***  ASSESSMENT & PLAN:   No problem-specific Assessment & Plan notes found for this encounter.   PAF -Currently on amiodarone 200 mg daily -Continue metoprolol 25 mg twice daily -He is on anticoagulation with Eliquis without any blood in his urine or stools  S/P TAVR 2018 23 mm sapient 3 normal function on TTE January 2022 -No murmur on exam  Status post left  carotid stenting -Residual stenosis and kinking of vessel distal to stent on duplex January 2022 -Right subclavian steal, would take BP in left arm for accuracy -Encouraged to follow-up with [facility]  Atypical chest pain/CAD -No acute EKG changes -Recent troponin negative -Last cardiac catheterization 2018 prior to TAVR without any significant CAD  Stage IIIb chronic kidney disease -Creatinine has been stable at 1.78 -Avoid nephrotoxic drugs  HFpEF -Echocardiogram 2018: EF 60-65, no RWMA, mild LVH, Gr 2 DD, normal RVSF, mild LAE, s/p TAVR w mean 13 mmHg, no AI      {Are you ordering a CV Procedure (e.g. stress test, cath, DCCV, TEE, etc)?   Press F2        :[id number]}   Dispo:  No follow-ups on file.    Prior CV studies: *** Carotid US 05/02/20 R 60-79; R subclavian stenosis w evidence of steal; L patent  Echocardiogram 04/24/20 EF 60-65, no RWMA, mild LVH, Gr 2 DD, normal RVSF, mild LAE, s/p TAVR w mean 13 mmHg, no AI  Head and neck CTA 04/23/20 L carotid stent 75%; R 65%; R subclavian stenosis; aortic atherosclerosis   Myoview 12/05/2017 No ischemia or infarction, EF 64; low risk  Cardiac catheterization 04/30/2016 LCx ostial-proximal 40  {Select studies to display:26339}    Past Medical History:  Diagnosis Date   Anxiety    situational -    Asthma due to seasonal allergies    Atrial fibrillation (HCC)  pt on Eliquis   Chest pain    CHF (congestive heart failure) (HCC)    Depression    situational   Dyspnea    when I walk a long distance   Dysrhythmia    Edema    GERD (gastroesophageal reflux disease)    ocassional   Heart murmur    HTN (hypertension)    Hyperlipidemia    Hypothyroidism    Neuropathy    Obesity    Peripheral vascular disease ([place])    carotid    Renal insufficiency 12/04/2017   Sleep apnea    SOB (shortness of breath)    Stroke (HCC)    Right side weakness, using a cane   Current Medications: No outpatient medications have been marked  as taking for the 04/10/21 encounter (Appointment) with Richardson Dopp T, PA-C.    Allergies:   Patient has no known allergies.   Social History   Tobacco Use   Smoking status: Former   Smokeless tobacco: Never   Tobacco comments:    quit early 1980's   Vaping Use   Vaping Use: Never used  Substance Use Topics   Alcohol use: No   Drug use: No    Family Hx: The patient's family history includes Heart attack in his mother; Stroke in his father.  ROS   EKGs/Labs/Other Test Reviewed:    EKG:  EKG is *** ordered today.  The ekg ordered today demonstrates ***  Recent Labs: 09/13/2020: ALT 15; B Natriuretic Peptide 56.8; BUN 29; Creatinine, Ser 1.78; Hemoglobin 16.0; Magnesium 2.4; Platelets 272; Potassium 4.2; Sodium 135   Recent Lipid Panel Lab Results  Component Value Date/Time   CHOL 102 09/06/2020 05:04 AM   TRIG 67 09/06/2020 05:04 AM   HDL 37 (L) 09/06/2020 05:04 AM   LDLCALC 52 09/06/2020 05:04 AM     Risk Assessment/Calculations:   {Does this patient have ATRIAL FIBRILLATION?:[phone]}      Physical Exam:    VS:  There were no vitals taken for this visit.    Wt Readings from Last 3 Encounters:  09/06/20 (!) 310 lb 3 oz (140.7 kg)  05/22/20 (!) 306 lb (138.8 kg)  04/23/20 (!) 312 lb (141.5 kg)    Physical Exam ***    Medication Adjustments/Labs and Tests Ordered: Current medicines are reviewed at length with the patient today.  Concerns regarding medicines are outlined above.  Tests Ordered: No orders of the defined types were placed in this encounter.  Medication Changes: No orders of the defined types were placed in this encounter.  Signed, Richardson Dopp, PA-C  04/03/2021 12:36 PM    [facility] [address], [place], [place]  [address] Phone: [phone]; Fax: [phone]

## 2021-04-10 ENCOUNTER — Ambulatory Visit: Payer: Medicare Other | Admitting: Physician Assistant

## 2021-04-25 ENCOUNTER — Other Ambulatory Visit: Payer: Self-pay | Admitting: Cardiovascular Disease

## 2021-05-24 ENCOUNTER — Other Ambulatory Visit: Payer: Self-pay | Admitting: Cardiovascular Disease

## 2021-06-02 NOTE — Progress Notes (Incomplete)
CARDIOLOGY CONSULT NOTE       Patient ID: Jorge [name] MRN: [id number] DOB/AGE: 1945-10-13 76 y.o.  Admit date: (Not on file) Referring Physician: Anna Genre Primary Physician: [name], PA-C Primary Cardiologist: New Reason for Consultation: AVR/PAF  Active Problems:   * No active hospital problems. *   HPI:  76 y.o. referred by Anna Genre for PAF and AV disease History of PAF, CKD 3, PVD, HTN AVR and asthma Admitted 09/12/20 with COVID complicated by PAF converted with amiodarone and d/c with eliquis He has had CVA with left carotid stent TAVR with 23 mm Sapien 3 valve 2018 No significant CAD on cath prior to TAVR   TTE 04/24/20 EF 60-65% normal TAVR valve with stable mean gradient 13 mmHg no PVL peak 24 mmHg DVI 0.50 AVA 1.6 cm2  Carotid 05/02/20 RICA 60-79% with right subclavian steal patent left stent with no stenosis   ***  ROS All other systems reviewed and negative except as noted above  Past Medical History:  Diagnosis Date   Anxiety    situational -    Asthma due to seasonal allergies    Atrial fibrillation (HCC)    pt on Eliquis   Chest pain    CHF (congestive heart failure) (HCC)    Depression    situational   Dyspnea    when I walk a long distance   Dysrhythmia    Edema    GERD (gastroesophageal reflux disease)    ocassional   Heart murmur    HTN (hypertension)    Hyperlipidemia    Hypothyroidism    Neuropathy    Obesity    Peripheral vascular disease (HCC)    carotid    Renal insufficiency 12/04/2017   Sleep apnea    SOB (shortness of breath)    Stroke (HCC)    Right side weakness, using a cane    Family History  Problem Relation Age of Onset   Heart attack Mother    Stroke Father     Social History   Socioeconomic History   Marital status: Divorced    Spouse name: Not on file   Number of children: 3   Years of education: Not on file   Highest education level: Not on file  Occupational History   Not on  file  Tobacco Use   Smoking status: Former   Smokeless tobacco: Never   Tobacco comments:    quit early 1980's   Vaping Use   Vaping Use: Never used  Substance and Sexual Activity   Alcohol use: No   Drug use: No   Sexual activity: Not on file  Other Topics Concern   Not on file  Social History Narrative   Not on file   Social Determinants of Health   Financial Resource Strain: Not on file  Food Insecurity: Not on file  Transportation Needs: Not on file  Physical Activity: Not on file  Stress: Not on file  Social Connections: Not on file  Intimate Partner Violence: Not on file    Past Surgical History:  Procedure Laterality Date   BACK SURGERY     LUmbar laminectomy   CARDIAC CATHETERIZATION     CARDIAC CATHETERIZATION N/A 04/30/2016   Procedure: Right/Left Heart Cath and Coronary Angiography;  Surgeon: Kathleene Hazel, MD;  Location: [facility] INVASIVE CV LAB;  Service: Cardiovascular;  Laterality: N/A;   CAROTID ANGIOGRAM N/A 05/30/2014   Procedure: Dorise Bullion;  Surgeon: Nada Libman, MD;  Location: [facility] CATH  LAB;  Service: Cardiovascular;  Laterality: N/A;   EYE SURGERY Right    cataract   EYE SURGERY Left    bioocular lens implanted due injury   LUMBAR FUSION     MULTIPLE EXTRACTIONS WITH ALVEOLOPLASTY N/A 05/02/2016   Procedure: Extraction of tooth #'s 7, 10, 23, 24, 25,and 26 with alveoloplasty.;  Surgeon: Charlynne Pander, DDS;  Location: [facility] OR;  Service: Oral Surgery;  Laterality: N/A;   TEE WITHOUT CARDIOVERSION N/A 05/06/2016   Procedure: TRANSESOPHAGEAL ECHOCARDIOGRAM (TEE);  Surgeon: Kathleene Hazel, MD;  Location: [facility] OR;  Service: Open Heart Surgery;  Laterality: N/A;   TRANSCAROTID ARTERY REVASCULARIZATION (TCAR)  01/15/2018   TRANSCAROTID ARTERY REVASCULARIZATION LEFT    TRANSCAROTID ARTERY REVASCULARIZATION  Left 01/15/2018   Procedure: TRANSCAROTID ARTERY REVASCULARIZATION LEFT;  Surgeon: Sherren Kerns, MD;   Location: [facility] OR;  Service: Vascular;  Laterality: Left;   TRANSCAROTID ARTERY REVASCULARIZATION  Left 04/25/2020   Procedure: TRANSCAROTID ARTERY REVASCULARIZATION;  Surgeon: Cephus Shelling, MD;  Location: [facility] OR;  Service: Vascular;  Laterality: Left;   TRANSCATHETER AORTIC VALVE REPLACEMENT, TRANSFEMORAL N/A 05/06/2016   Procedure: TRANSCATHETER AORTIC VALVE REPLACEMENT, TRANSFEMORAL using a 52mm Edwards Sapien 3 Transcatheter Heart Valve;  Surgeon: Kathleene Hazel, MD;  Location: [facility] OR;  Service: Open Heart Surgery;  Laterality: N/A;   ULTRASOUND GUIDANCE FOR VASCULAR ACCESS Right 04/25/2020   Procedure: ULTRASOUND GUIDANCE FOR VASCULAR ACCESS;  Surgeon: Cephus Shelling, MD;  Location: [facility] OR;  Service: Vascular;  Laterality: Right;      Current Outpatient Medications:    acetaminophen (TYLENOL) 650 MG CR tablet, Take 1,300 mg by mouth every 8 (eight) hours as needed for pain., Disp: , Rfl:    amiodarone (PACERONE) 200 MG tablet, Take 1 tablet (200 mg total) by mouth daily., Disp: 30 tablet, Rfl: 0   apixaban (ELIQUIS) 5 MG TABS tablet, Take 1 tablet (5 mg total) by mouth 2 (two) times daily., Disp: 60 tablet, Rfl: 3   aspirin EC 81 MG tablet, Take 1 tablet (81 mg total) by mouth daily., Disp: 150 tablet, Rfl: 2   atorvastatin (LIPITOR) 40 MG tablet, Take 40 mg by mouth at bedtime., Disp: , Rfl:    furosemide (LASIX) 20 MG tablet, Take 20 mg by mouth daily. (Patient not taking: Reported on 09/06/2020), Disp: , Rfl:    gabapentin (NEURONTIN) 600 MG tablet, Take 600 mg by mouth in the morning and at bedtime., Disp: , Rfl:    levothyroxine (SYNTHROID) 50 MCG tablet, Take 50 mcg by mouth daily before breakfast., Disp: , Rfl:    metoprolol tartrate (LOPRESSOR) 25 MG tablet, Take 25 mg by mouth 2 (two) times daily., Disp: , Rfl:    nitroGLYCERIN (NITROSTAT) 0.4 MG SL tablet, Place 1 tablet (0.4 mg total) under the tongue every 5 (five) minutes as needed for chest pain.,  Disp: 20 tablet, Rfl: 1   omeprazole (PRILOSEC) 40 MG capsule, Take 40 mg by mouth at bedtime., Disp: , Rfl:    polyethylene glycol powder (GLYCOLAX/MIRALAX) powder, Take 17 g by mouth daily. (Patient taking differently: Take 17 g by mouth daily as needed (constipation).), Disp: 255 g, Rfl: 0    Physical Exam: There were no vitals taken for this visit.   Affect appropriate Overweight chronically ill male  HEENT: normal Neck supple with no adenopathy JVP normal bilateral bruits no thyromegaly Lungs clear with no wheezing and good diaphragmatic motion Heart:  S1/S2 SEM through TAVR valve no AR murmur, no rub,  gallop or click PMI normal Abdomen: benighn, BS positve, no tenderness, no AAA no bruit.  No HSM or HJR Distal pulses intact with no bruits No edema Neuro non-focal Skin warm and dry No muscular weakness BP lower in right arm ***   Labs:   Lab Results  Component Value Date   WBC 9.6 09/13/2020   HGB 16.0 09/13/2020   HCT 48.3 09/13/2020   MCV 93.6 09/13/2020   PLT 272 09/13/2020   No results for input(s): NA, K, CL, CO2, BUN, CREATININE, CALCIUM, PROT, BILITOT, ALKPHOS, ALT, AST, GLUCOSE in the last 168 hours.  Invalid input(s): LABALBU Lab Results  Component Value Date   CKTOTAL 190 12/23/2017   TROPONINI <0.03 03/06/2018    Lab Results  Component Value Date   CHOL 102 09/06/2020   CHOL 112 04/24/2020   CHOL 206 (H) 12/04/2017   Lab Results  Component Value Date   HDL 37 (L) 09/06/2020   HDL 30 (L) 04/24/2020   HDL 44 12/04/2017   Lab Results  Component Value Date   LDLCALC 52 09/06/2020   LDLCALC 62 04/24/2020   LDLCALC 122 (H) 12/04/2017   Lab Results  Component Value Date   TRIG 67 09/06/2020   TRIG 98 04/24/2020   TRIG 198 (H) 12/04/2017   Lab Results  Component Value Date   CHOLHDL 2.8 09/06/2020   CHOLHDL 3.7 04/24/2020   CHOLHDL 4.7 12/04/2017   No results found for: LDLDIRECT    Radiology: No results found.  EKG: Afib rate  102 LAD IVCD    ASSESSMENT AND PLAN:   PAF:  continue low amiodarone and elqiuis f/u TSH/LFTls and PFTls with DLCO TAVR: 2018 23 mm Sapien gradients ok by TTE 04/24/20 no PVL update echo Carotid:  F/U [facility] Dr Chestine Spore Left ICA stent patent with no restenosis duplex 05/02/20 but 60-79% RICA and right subclavian steal Update US Thyroid:  on synthroid replacement check TSH HLD:  on statin check labs  Refer [facility] f/u [place]  Carotid US TTE post TAVR Labs for amiodarone PFT DLCO Lipids  F/U in a year   Signed: Charlton Haws 06/02/2021, 11:29 PM

## 2021-06-05 ENCOUNTER — Telehealth: Payer: Self-pay

## 2021-06-05 NOTE — Telephone Encounter (Signed)
NOTES SCANNED TO REFERRAL 

## 2021-06-06 ENCOUNTER — Ambulatory Visit: Payer: Medicare Other | Admitting: Cardiovascular Disease
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
## 2021-06-28 ENCOUNTER — Ambulatory Visit (INDEPENDENT_AMBULATORY_CARE_PROVIDER_SITE_OTHER): Payer: Medicare Other | Admitting: Cardiovascular Disease

## 2021-06-28 ENCOUNTER — Other Ambulatory Visit: Payer: Self-pay

## 2021-06-28 ENCOUNTER — Encounter: Payer: Self-pay | Admitting: Cardiovascular Disease

## 2021-06-28 VITALS — BP 126/66 | HR 64 | Ht 73.0 in | Wt 333.0 lb

## 2021-06-28 DIAGNOSIS — I6521 Occlusion and stenosis of right carotid artery: Secondary | ICD-10-CM | POA: Diagnosis not present

## 2021-06-28 DIAGNOSIS — I2510 Atherosclerotic heart disease of native coronary artery without angina pectoris: Secondary | ICD-10-CM

## 2021-06-28 DIAGNOSIS — I6522 Occlusion and stenosis of left carotid artery: Secondary | ICD-10-CM

## 2021-06-28 DIAGNOSIS — Z952 Presence of prosthetic heart valve: Secondary | ICD-10-CM | POA: Diagnosis not present

## 2021-06-28 DIAGNOSIS — I482 Chronic atrial fibrillation, unspecified: Secondary | ICD-10-CM

## 2021-06-28 NOTE — Patient Instructions (Addendum)
Medication Instructions:  ?NO CHANGES ?*If you need a refill on your cardiac medications before your next appointment, please call your pharmacy* ? ? ?Lab Work: ?NONE ?If you have labs (blood work) drawn today and your tests are completely normal, you will receive your results only by: ?MyChart Message (if you have MyChart) OR ?A paper copy in the mail ?If you have any lab test that is abnormal or we need to change your treatment, we will call you to review the results. ? ? ?Testing/Procedures: ?Your physician has requested that you have a carotid duplex. This test is an ultrasound of the carotid arteries in your neck. It looks at blood flow through these arteries that supply the brain with blood. Allow one hour for this exam. There are no restrictions or special instructions. ?Your physician has requested that you have an echocardiogram. Echocardiography is a painless test that uses sound waves to create images of your heart. It provides your doctor with information about the size and shape of your heart and how well your heart?s chambers and valves are working. This procedure takes approximately one hour. There are no restrictions for this procedure. ? ? ? ?Follow-Up: ?At [facility], you and your health needs are our priority.  As part of our continuing mission to provide you with exceptional heart care, we have created designated Provider Care Teams.  These Care Teams include your primary Cardiologist (physician) and Advanced Practice Providers (APPs -  Physician Assistants and Nurse Practitioners) who all work together to provide you with the care you need, when you need it. ? ?We recommend signing up for the patient portal called "MyChart".  Sign up information is provided on this After Visit Summary.  MyChart is used to connect with patients for Virtual Visits (Telemedicine).  Patients are able to view lab/test results, encounter notes, upcoming appointments, etc.  Non-urgent messages can be sent to your  provider as well.   ?To learn more about what you can do with MyChart, go to [email or web address].   ? ?Your next appointment:   ?6 month(s) ? ?The format for your next appointment:   ?In Person ? ?Provider:   ?Charlton Haws, MD   ? ? ?Other Instructions ?REFERRAL TO DR Chestine Spore AT [facility] PT HAS SEEN DR Chestine Spore  ?

## 2021-07-05 ENCOUNTER — Ambulatory Visit (HOSPITAL_COMMUNITY)
Admission: RE | Admit: 2021-07-05 | Discharge: 2021-07-05 | Disposition: A | Discharge status: Home / Self Care | Payer: Medicare Other | Source: Ambulatory Visit | Attending: Cardiovascular Disease | Admitting: Cardiovascular Disease

## 2021-07-05 DIAGNOSIS — I6521 Occlusion and stenosis of right carotid artery: Secondary | ICD-10-CM | POA: Diagnosis not present

## 2021-07-05 DIAGNOSIS — I6522 Occlusion and stenosis of left carotid artery: Secondary | ICD-10-CM | POA: Diagnosis present

## 2021-07-05 DIAGNOSIS — I6523 Occlusion and stenosis of bilateral carotid arteries: Secondary | ICD-10-CM | POA: Diagnosis not present

## 2021-07-08 ENCOUNTER — Telehealth: Payer: Self-pay

## 2021-07-08 DIAGNOSIS — I482 Chronic atrial fibrillation, unspecified: Secondary | ICD-10-CM

## 2021-07-08 DIAGNOSIS — Z952 Presence of prosthetic heart valve: Secondary | ICD-10-CM

## 2021-07-08 NOTE — Telephone Encounter (Signed)
Called patient with carotid results. Patient complaining of fatigue, falls asleep just sitting and reading, SOB with activity, and cannot walk far without getting tired and SOB. Patient stated he feels like his "body is giving out on him". Patient stated he has had a rough time since he was in the hospital last year. Patient stated he just wanted to let someone know how bad he has been feeling. Will forward message to Dr. Eden Emms. ?

## 2021-07-10 NOTE — Telephone Encounter (Addendum)
Left message for patient to call back. Will give him Dr. Fabio Bering advisement, when he calls back. Per Dr. Eden Emms, His EF is normal f/u primary heart not that bad. ?

## 2021-07-19 ENCOUNTER — Ambulatory Visit (HOSPITAL_COMMUNITY): Payer: Medicare Other | Attending: Internal Medicine

## 2021-07-19 DIAGNOSIS — Z952 Presence of prosthetic heart valve: Secondary | ICD-10-CM

## 2021-07-19 LAB — ECHOCARDIOGRAM COMPLETE
AR max vel: 1.37 cm2
AV Area VTI: 1.35 cm2
AV Area mean vel: 1.44 cm2
AV Mean grad: 16 mmHg
AV Peak grad: 29.1 mmHg
Ao pk vel: 2.7 m/s
Area-P 1/2: 2.88 cm2
S' Lateral: 3.2 cm

## 2021-07-25 NOTE — Telephone Encounter (Signed)
-----   Message from Wendall Stade, MD sent at 07/21/2021 12:02 PM EDT ----- ?Gradients a little higher EF normal f/u echo in 6 months  ?

## 2021-07-25 NOTE — Telephone Encounter (Signed)
The patient has been notified of the result and verbalized understanding.  All questions (if any) were answered. ?Ethelda Chick, RN 07/25/2021 1:06 PM  ? ?Placed order for echo in 6 months. ?

## 2021-08-05 ENCOUNTER — Other Ambulatory Visit: Payer: Self-pay | Admitting: Cardiovascular Disease

## 2021-08-26 ENCOUNTER — Other Ambulatory Visit: Payer: Self-pay | Admitting: *Deleted

## 2021-08-26 DIAGNOSIS — I6523 Occlusion and stenosis of bilateral carotid arteries: Secondary | ICD-10-CM

## 2021-09-03 ENCOUNTER — Ambulatory Visit: Payer: Medicare Other | Admitting: Vascular Surgery

## 2021-09-03 ENCOUNTER — Encounter: Payer: Self-pay | Admitting: Vascular Surgery

## 2021-09-03 ENCOUNTER — Ambulatory Visit (HOSPITAL_COMMUNITY)
Admission: RE | Admit: 2021-09-03 | Discharge: 2021-09-03 | Disposition: A | Discharge status: Home / Self Care | Payer: Medicare Other | Source: Ambulatory Visit | Attending: Vascular Surgery | Admitting: Vascular Surgery

## 2021-09-03 VITALS — BP 89/53 | HR 60 | Temp 98.3°F | Resp 18 | Ht 73.5 in | Wt 331.0 lb

## 2021-09-03 DIAGNOSIS — I6523 Occlusion and stenosis of bilateral carotid arteries: Secondary | ICD-10-CM

## 2021-09-03 DIAGNOSIS — I6522 Occlusion and stenosis of left carotid artery: Secondary | ICD-10-CM

## 2021-09-03 NOTE — Progress Notes (Signed)
Patient name: Jorge [name] MRN: [id number] DOB: Jul 27, 1945 Sex: male  REASON FOR CONSULT: 40-month follow-up for carotid surveillance  HPI: Jorge [name] is a 76 y.o. male, who presemts fpr surveillance of carotid artery disease.  He previously had a left carotid endarterectomy and also a previous left TCAR with Dr. Fields.  He was seen in the hospital with a TIA left brain event thought to be from restenosis of his carotid stent placed in 2019 during TCAR by Dr. Fields.  Ultimately underwent a redo left TCAR by myself on 05/05/20.     Since last evaluation sounds like he was admitted to [facility] in December.  Sounds like he got lightheaded with blurry vision and ultimately ended up being put on amiodarone.  Feels he is at his neurologic baseline today.  Past Medical History:  Diagnosis Date   Anxiety    situational -    Asthma due to seasonal allergies    Atrial fibrillation (HCC)    pt on Eliquis   Chest pain    CHF (congestive heart failure) (HCC)    Depression    situational   Dyspnea    when I walk a long distance   Dysrhythmia    Edema    GERD (gastroesophageal reflux disease)    ocassional   Heart murmur    HTN (hypertension)    Hyperlipidemia    Hypothyroidism    Neuropathy    Obesity    Peripheral vascular disease (HCC)    carotid    Renal insufficiency 12/04/2017   Sleep apnea    SOB (shortness of breath)    Stroke (HCC)    Right side weakness, using a cane    Past Surgical History:  Procedure Laterality Date   BACK SURGERY     LUmbar laminectomy   CARDIAC CATHETERIZATION     CARDIAC CATHETERIZATION N/A 04/30/2016   Procedure: Right/Left Heart Cath and Coronary Angiography;  Surgeon: Venba Zenner D McAlhany, MD;  Location: [facility] INVASIVE CV LAB;  Service: Cardiovascular;  Laterality: N/A;   CAROTID ANGIOGRAM N/A 05/30/2014   Procedure: CERBRAL  ANGIOGRAM;  Surgeon: Vance W Brabham, MD;  Location: [facility] CATH LAB;  Service: Cardiovascular;  Laterality:  N/A;   EYE SURGERY Right    cataract   EYE SURGERY Left    bioocular lens implanted due injury   LUMBAR FUSION     MULTIPLE EXTRACTIONS WITH ALVEOLOPLASTY N/A 05/02/2016   Procedure: Extraction of tooth #'s 7, 10, 23, 24, 25,and 26 with alveoloplasty.;  Surgeon: Ronald F Kulinski, DDS;  Location: [facility] OR;  Service: Oral Surgery;  Laterality: N/A;   TEE WITHOUT CARDIOVERSION N/A 05/06/2016   Procedure: TRANSESOPHAGEAL ECHOCARDIOGRAM (TEE);  Surgeon: Willadene Mounsey D McAlhany, MD;  Location: [facility] OR;  Service: Open Heart Surgery;  Laterality: N/A;   TRANSCAROTID ARTERY REVASCULARIZATION (TCAR)  01/15/2018   TRANSCAROTID ARTERY REVASCULARIZATION LEFT    TRANSCAROTID ARTERY REVASCULARIZATION  Left 01/15/2018   Procedure: TRANSCAROTID ARTERY REVASCULARIZATION LEFT;  Surgeon: Fields, Charles E, MD;  Location: [facility] OR;  Service: Vascular;  Laterality: Left;   TRANSCAROTID ARTERY REVASCULARIZATION  Left 04/25/2020   Procedure: TRANSCAROTID ARTERY REVASCULARIZATION;  Surgeon: Fain Francis J, MD;  Location: [facility] OR;  Service: Vascular;  Laterality: Left;   TRANSCATHETER AORTIC VALVE REPLACEMENT, TRANSFEMORAL N/A 05/06/2016   Procedure: TRANSCATHETER AORTIC VALVE REPLACEMENT, TRANSFEMORAL using a 27mmSerKentuckyeEdwCLatanya M2Korea235m9-SerKentuckyeEdwLake ArLatanya M2[place]orea457m1-SerKentuckyeEdwBeaLatanya M2[place]orea488m5-SerKentuckyeEdwPleasaLatanya M2Korea849m2-SerKentuckyeEdwLLatanya M2Korea844m2-SerKentuckyeELatanya M2[place]orea(586m8)SerKentuckyeEdwLatanya M2Korea992m8-SerKentuckyeEdwSpriLatanya M2Korea464m2-SerKentuckyeEdwMussLatanya M2[place](7738m) SerKentuckyeEdwBlaLatanya M2[place](8565m) SerKentuckyeEdwNew KnLatanya M2Korea921m8-SerKentuckyeEdwEaLatanya M2Korea826m8-SerKentuckyeEdwFLatanya M2Korea266m3-SerKentuckyeEdwShaLatanya M2Kore53m60SerKentuckyeEdLatanya M2Korea(6080mKentuckEd2Korea481m5-SerKentuckyeEdwGatLatanya M2Korea590m1 SerKentuckyeEdwTurpiLatanya M2Kore62m41SerKentuckyeEdwBonneaLatanya M2Korea749m2-SerKentuckyeEdLatanya M2Korea984m5-SerKentuckyeEdwBryce CanyLatanya M2Korea741m5-SerKentuckyeEdwOtLatanya M2Korea604 704 0134hsapien 3 Transcatheter Heart Valve;  Surgeon: Maebell Lyvers D McAlhany, MD;  Location: [facility] OR;  Service: Open Heart Surgery;  Laterality:  N/A;   ULTRASOUND GUIDANCE FOR VASCULAR ACCESS Right 04/25/2020   Procedure: ULTRASOUND GUIDANCE FOR VASCULAR ACCESS;  Surgeon: Marty Heck, MD;  Location: [place];  Service: Vascular;  Laterality: Right;    Family History  Problem Relation Age of Onset   Heart attack Mother    Stroke Father     SOCIAL HISTORY: Social History   Socioeconomic History   Marital status: Divorced    Spouse name: Not on file   Number of children: 3   Years of education: Not on file   Highest education level: Not on file  Occupational History   Not on file  Tobacco Use    Smoking status: Former   Smokeless tobacco: Never   Tobacco comments:    quit early 1980's   Vaping Use   Vaping Use: Never used  Substance and Sexual Activity   Alcohol use: No   Drug use: No   Sexual activity: Not on file  Other Topics Concern   Not on file  Social History Narrative   Not on file   Social Determinants of Health   Financial Resource Strain: Not on file  Food Insecurity: Not on file  Transportation Needs: Not on file  Physical Activity: Not on file  Stress: Not on file  Social Connections: Not on file  Intimate Partner Violence: Not on file    No Known Allergies  Current Outpatient Medications  Medication Sig Dispense Refill   acetaminophen (TYLENOL) 650 MG CR tablet Take 1,300 mg by mouth every 8 (eight) hours as needed for pain.     amiodarone (PACERONE) 200 MG tablet Take 1 tablet (200 mg total) by mouth daily. 90 tablet 3   apixaban (ELIQUIS) 5 MG TABS tablet Take 1 tablet (5 mg total) by mouth 2 (two) times daily. 60 tablet 3   atorvastatin (LIPITOR) 40 MG tablet Take 40 mg by mouth at bedtime.     furosemide (LASIX) 20 MG tablet Take 20 mg by mouth daily.     gabapentin (NEURONTIN) 600 MG tablet Take 600 mg by mouth in the morning and at bedtime.     levothyroxine (SYNTHROID) 50 MCG tablet Take 50 mcg by mouth daily before breakfast.     metoprolol tartrate (LOPRESSOR) 25 MG tablet Take 25 mg by mouth 2 (two) times daily.     nitroGLYCERIN (NITROSTAT) 0.4 MG SL tablet Place 1 tablet (0.4 mg total) under the tongue every 5 (five) minutes as needed for chest pain. 20 tablet 1   omeprazole (PRILOSEC) 40 MG capsule Take 40 mg by mouth at bedtime.     polyethylene glycol powder (GLYCOLAX/MIRALAX) powder Take 17 g by mouth daily. (Patient taking differently: Take 17 g by mouth daily as needed (constipation).) 255 g 0   aspirin EC 81 MG tablet Take 1 tablet (81 mg total) by mouth daily. (Patient not taking: Reported on 09/03/2021) 150 tablet 2   No current  facility-administered medications for this visit.    REVIEW OF SYSTEMS:  [X]  denotes positive finding, [ ]  denotes negative finding Cardiac  Comments:  Chest pain or chest pressure:    Shortness of breath upon exertion:    Short of breath when lying flat:    Irregular heart rhythm:        Vascular    Pain in calf, thigh, or hip brought on by ambulation:    Pain in feet at night that wakes you up from your sleep:     Blood clot in  your veins:    Leg swelling:         Pulmonary    Oxygen at home:    Productive cough:     Wheezing:         Neurologic    Sudden weakness in arms or legs:     Sudden numbness in arms or legs:     Sudden onset of difficulty speaking or slurred speech:    Temporary loss of vision in one eye:     Problems with dizziness:         Gastrointestinal    Blood in stool:     Vomited blood:         Genitourinary    Burning when urinating:     Blood in urine:        Psychiatric    Major depression:         Hematologic    Bleeding problems:    Problems with blood clotting too easily:        Skin    Rashes or ulcers:        Constitutional    Fever or chills:      PHYSICAL EXAM: Vitals:   09/03/21 1456 09/03/21 1500  BP: (!) 100/39 (!) 89/53  Pulse: 60 60  Resp: 18   Temp: 98.3 F (36.8 C)   TempSrc: Temporal   SpO2: 92%   Weight: (!) 331 lb (150.1 kg)   Height: 6' 1.5" (1.867 m)     GENERAL: The patient is a well-nourished male, in no acute distress. The vital signs are documented above. CARDIAC: There is a regular rate and rhythm.  VASCULAR:  Well healed left neck incision. NEUROLOGIC: No focal weakness or paresthesias are detected.  CN II-XII grossly intact.   DATA:   Carotid duplex today shows stable 60 to 79% right ICA stenosis and a widely patent left carotid stent.  Assessment/Plan:  76 year old male previously had a left carotid endarterectomy and also a previous left TCAR with Dr. Oneida Alar.  He was later seen in the  hospital with a TIA left brain event thought to be from restenosis of his carotid stent placed in 2019.  Ultimately underwent a redo left TCAR by myself on 05/05/20.   Discussed his left carotid stent looks widely patent today.  He has a stable 60 to 79% right ICA stenosis.  Discussed in the setting of asymptomatic carotid disease reserve surgical intervention for greater than 80% stenosis.  We will plan to see him again in 6 months with carotid duplex here in the office.  He is on aspirin and statin for risk reduction.   Marty Heck, MD [facility] Office: [phone]

## 2021-09-06 ENCOUNTER — Other Ambulatory Visit: Payer: Self-pay | Admitting: *Deleted

## 2021-09-06 DIAGNOSIS — I6523 Occlusion and stenosis of bilateral carotid arteries: Secondary | ICD-10-CM

## 2021-12-21 ENCOUNTER — Emergency Department (HOSPITAL_COMMUNITY): Payer: Medicare Other

## 2021-12-21 ENCOUNTER — Encounter (HOSPITAL_COMMUNITY): Payer: Self-pay | Admitting: Emergency Medicine

## 2021-12-21 ENCOUNTER — Other Ambulatory Visit: Payer: Self-pay

## 2021-12-21 ENCOUNTER — Emergency Department (HOSPITAL_COMMUNITY)
Admission: EM | Admit: 2021-12-21 | Discharge: 2021-12-23 | Disposition: A | Discharge status: Home / Self Care | Payer: Medicare Other | Attending: Emergency Medicine | Admitting: Emergency Medicine

## 2021-12-21 DIAGNOSIS — R339 Retention of urine, unspecified: Secondary | ICD-10-CM | POA: Insufficient documentation

## 2021-12-21 DIAGNOSIS — L304 Erythema intertrigo: Secondary | ICD-10-CM | POA: Diagnosis not present

## 2021-12-21 DIAGNOSIS — I130 Hypertensive heart and chronic kidney disease with heart failure and stage 1 through stage 4 chronic kidney disease, or unspecified chronic kidney disease: Secondary | ICD-10-CM | POA: Diagnosis not present

## 2021-12-21 DIAGNOSIS — R7309 Other abnormal glucose: Secondary | ICD-10-CM | POA: Diagnosis not present

## 2021-12-21 DIAGNOSIS — R944 Abnormal results of kidney function studies: Secondary | ICD-10-CM | POA: Insufficient documentation

## 2021-12-21 DIAGNOSIS — R001 Bradycardia, unspecified: Secondary | ICD-10-CM | POA: Diagnosis not present

## 2021-12-21 DIAGNOSIS — R2689 Other abnormalities of gait and mobility: Secondary | ICD-10-CM | POA: Insufficient documentation

## 2021-12-21 DIAGNOSIS — N189 Chronic kidney disease, unspecified: Secondary | ICD-10-CM | POA: Insufficient documentation

## 2021-12-21 DIAGNOSIS — W050XXA Fall from non-moving wheelchair, initial encounter: Secondary | ICD-10-CM | POA: Diagnosis not present

## 2021-12-21 DIAGNOSIS — W19XXXA Unspecified fall, initial encounter: Secondary | ICD-10-CM

## 2021-12-21 DIAGNOSIS — I509 Heart failure, unspecified: Secondary | ICD-10-CM | POA: Insufficient documentation

## 2021-12-21 DIAGNOSIS — Z7901 Long term (current) use of anticoagulants: Secondary | ICD-10-CM | POA: Diagnosis not present

## 2021-12-21 DIAGNOSIS — M545 Low back pain, unspecified: Secondary | ICD-10-CM | POA: Insufficient documentation

## 2021-12-21 DIAGNOSIS — R338 Other retention of urine: Secondary | ICD-10-CM

## 2021-12-21 LAB — CBG MONITORING, ED: Glucose-Capillary: 101 mg/dL — ABNORMAL HIGH (ref 70–99)

## 2021-12-21 NOTE — ED Provider Triage Note (Signed)
Emergency Medicine Provider Triage Evaluation Note  Jorge [name] , a 76 y.o. male  was evaluated in triage.  Pt complains of fall just prior to arrival.  Brought in by ambulance.  States he pivoted from his wheelchair to the bed, says he slid down off the bed and landed on his buttocks.  Denies any his head.  Denies loss of consciousness.  Reports lumbar pain and bilateral hip pain.  States he is becoming progressively weak since Monday and his neuropathy has increased.  Reports he is having a difficult time lifting his legs and feels like his feet have cinderblocks attached to them.  Patient has had a prior stroke and has right-sided deficit.  Review of Systems  Positive: As above Negative: Headache, vision changes  Physical Exam  BP (!) 162/117 (BP Location: Right Arm)   Pulse (!) 55   Temp 98 F (36.7 C) (Oral)   Resp 20   SpO2 94%  Gen:   Awake, no distress   Resp:  Normal effort  MSK:   Moves extremities without difficulty  Other:  Right-sided weakness, this is baseline per patient  Medical Decision Making  Medically screening exam initiated at 11:38 PM.  Appropriate orders placed.  Jorge [name] was informed that the remainder of the evaluation will be completed by another provider, this initial triage assessment does not replace that evaluation, and the importance of remaining in the ED until their evaluation is complete.  ED weakness work-up   [name], Hershal Coria 12/21/21 2342
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
## 2021-12-22 ENCOUNTER — Emergency Department (HOSPITAL_COMMUNITY): Payer: Medicare Other

## 2021-12-22 LAB — URINALYSIS, ROUTINE W REFLEX MICROSCOPIC
Bilirubin Urine: NEGATIVE
Glucose, UA: NEGATIVE mg/dL
Hgb urine dipstick: NEGATIVE
Ketones, ur: NEGATIVE mg/dL
Leukocytes,Ua: NEGATIVE
Nitrite: NEGATIVE
Protein, ur: NEGATIVE mg/dL
Specific Gravity, Urine: 1.013 (ref 1.005–1.030)
pH: 5 (ref 5.0–8.0)

## 2021-12-22 LAB — BASIC METABOLIC PANEL
Anion gap: 11 (ref 5–15)
BUN: 31 mg/dL — ABNORMAL HIGH (ref 8–23)
CO2: 23 mmol/L (ref 22–32)
Calcium: 8.7 mg/dL — ABNORMAL LOW (ref 8.9–10.3)
Chloride: 104 mmol/L (ref 98–111)
Creatinine, Ser: 1.81 mg/dL — ABNORMAL HIGH (ref 0.61–1.24)
GFR, Estimated: 38 mL/min — ABNORMAL LOW (ref >60)
Glucose, Bld: 109 mg/dL — ABNORMAL HIGH (ref 70–99)
Potassium: 3.7 mmol/L (ref 3.5–5.1)
Sodium: 138 mmol/L (ref 135–145)

## 2021-12-22 LAB — CBC
HCT: 48.6 % (ref 39.0–52.0)
Hemoglobin: 16.3 g/dL (ref 13.0–17.0)
MCH: 31.5 pg (ref 26.0–34.0)
MCHC: 33.5 g/dL (ref 30.0–36.0)
MCV: 94 fL (ref 80.0–100.0)
Platelets: 212 10*3/uL (ref 150–400)
RBC: 5.17 MIL/uL (ref 4.22–5.81)
RDW: 13.3 % (ref 11.5–15.5)
WBC: 8.6 10*3/uL (ref 4.0–10.5)
nRBC: 0 % (ref 0.0–0.2)

## 2021-12-22 MED ORDER — HYDROCODONE-ACETAMINOPHEN 5-325 MG PO TABS
1.0000 | ORAL_TABLET | ORAL | Status: DC | PRN
  Administered 2021-12-23: 2 via ORAL
  Filled 2021-12-22: qty 2

## 2021-12-22 MED ORDER — AMIODARONE HCL 200 MG PO TABS
200.0000 mg | ORAL_TABLET | Freq: Every day | ORAL | Status: DC
  Administered 2021-12-22 – 2021-12-23 (×2): 200 mg via ORAL
  Filled 2021-12-22 (×2): qty 1

## 2021-12-22 MED ORDER — LEVOTHYROXINE SODIUM 75 MCG PO TABS
175.0000 ug | ORAL_TABLET | Freq: Every day | ORAL | Status: DC
  Administered 2021-12-22 – 2021-12-23 (×2): 175 ug via ORAL
  Filled 2021-12-22 (×2): qty 1

## 2021-12-22 MED ORDER — ATORVASTATIN CALCIUM 40 MG PO TABS
40.0000 mg | ORAL_TABLET | Freq: Every day | ORAL | Status: DC
  Administered 2021-12-22: 40 mg via ORAL
  Filled 2021-12-22: qty 1

## 2021-12-22 MED ORDER — DULOXETINE HCL 60 MG PO CPEP
60.0000 mg | ORAL_CAPSULE | Freq: Every day | ORAL | Status: DC
  Administered 2021-12-22 – 2021-12-23 (×2): 60 mg via ORAL
  Filled 2021-12-22: qty 1
  Filled 2021-12-22: qty 2

## 2021-12-22 MED ORDER — LORAZEPAM 2 MG/ML IJ SOLN: 1.0000 mg | Freq: Once | INTRAMUSCULAR | Status: DC

## 2021-12-22 MED ORDER — NYSTATIN 100000 UNIT/GM EX CREA
TOPICAL_CREAM | Freq: Two times a day (BID) | CUTANEOUS | Status: DC
  Filled 2021-12-22 (×2): qty 30

## 2021-12-22 MED ORDER — PREGABALIN 25 MG PO CAPS
150.0000 mg | ORAL_CAPSULE | Freq: Two times a day (BID) | ORAL | Status: DC
  Administered 2021-12-22 – 2021-12-23 (×2): 150 mg via ORAL
  Filled 2021-12-22 (×3): qty 2

## 2021-12-22 MED ORDER — FUROSEMIDE 20 MG PO TABS
20.0000 mg | ORAL_TABLET | Freq: Two times a day (BID) | ORAL | Status: DC
  Administered 2021-12-22 – 2021-12-23 (×2): 20 mg via ORAL
  Filled 2021-12-22 (×2): qty 1

## 2021-12-22 MED ORDER — APIXABAN 5 MG PO TABS
5.0000 mg | ORAL_TABLET | Freq: Two times a day (BID) | ORAL | Status: DC
  Administered 2021-12-22 – 2021-12-23 (×2): 5 mg via ORAL
  Filled 2021-12-22 (×3): qty 1

## 2021-12-22 MED ORDER — ADULT MULTIVITAMIN W/MINERALS CH
1.0000 | ORAL_TABLET | Freq: Every day | ORAL | Status: DC
  Administered 2021-12-22 – 2021-12-23 (×2): 1 via ORAL
  Filled 2021-12-22 (×2): qty 1

## 2021-12-22 MED ORDER — MORPHINE SULFATE (PF) 4 MG/ML IV SOLN
4.0000 mg | Freq: Once | INTRAVENOUS | Status: AC
  Administered 2021-12-22: 4 mg via INTRAMUSCULAR
  Filled 2021-12-22: qty 1

## 2021-12-22 MED ORDER — POLYETHYLENE GLYCOL 3350 17 G PO PACK: 17.0000 g | PACK | Freq: Every day | ORAL | Status: DC | PRN

## 2021-12-22 MED ORDER — PANTOPRAZOLE SODIUM 40 MG PO TBEC
80.0000 mg | DELAYED_RELEASE_TABLET | Freq: Every day | ORAL | Status: DC
  Administered 2021-12-22 – 2021-12-23 (×2): 80 mg via ORAL
  Filled 2021-12-22 (×2): qty 2

## 2021-12-22 MED ORDER — OXYBUTYNIN CHLORIDE ER 10 MG PO TB24
10.0000 mg | ORAL_TABLET | Freq: Every day | ORAL | Status: DC
  Administered 2021-12-23: 10 mg via ORAL
  Filled 2021-12-22 (×3): qty 1

## 2021-12-22 MED ORDER — NYSTATIN 100000 UNIT/GM EX POWD
Freq: Once | CUTANEOUS | Status: DC
  Filled 2021-12-22: qty 15

## 2021-12-22 MED ORDER — METOPROLOL TARTRATE 25 MG PO TABS
25.0000 mg | ORAL_TABLET | Freq: Two times a day (BID) | ORAL | Status: DC
  Administered 2021-12-22 – 2021-12-23 (×2): 25 mg via ORAL
  Filled 2021-12-22 (×3): qty 1
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
## 2021-12-22 NOTE — Progress Notes (Signed)
PT Cancellation Note  Patient Details Name: Jorge [name] MRN: [id number] DOB: 1946/03/03   Cancelled Treatment:    Reason Eval/Treat Not Completed: Patient at procedure or test/unavailable. Will plan to follow-up later as time permits.   Moishe Spice, PT, DPT Acute Rehabilitation Services  Office: [place] 12/22/2021, 2:00 PM

## 2021-12-22 NOTE — Evaluation (Signed)
Physical Therapy Evaluation Patient Details Name: Jorge [name] MRN: [id number] DOB: 1945-06-08 Today's Date: 12/22/2021  History of Present Illness  Pt is a 76 y.o. male who presented 12/21/21 s/p fall and progressive lower extremity weakness. CT showed no acute traumatic injury in lumbar spine but distended urinary bladder. 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 PMH includes paroxysmal atrial fibrillation, CKD stage III, CHF, PVD, HTN, anxiety disorder, heart murmur, CVA affecting R side, HLD, afib, asthma.   Clinical Impression  Pt presents with condition above and deficits mentioned below, see PT Problem List. Pt lives alone in a 1-level apartment with a ramp entrance. He can get PRN assistance from family members when they are not at work. Prior to his recent decline in strength, pt was mod I for functional mobility, often not needing UE support to ambulate within the home but intermittently held onto furniture. Pt uses a walking stick for community mobility and could ambulate up to the length of a "football field". As pt has progressively gotten weaker, he has begun to use his RW and power w/c more. At baseline, pt is L-handed and has some R-sided weakness from a prior CVA. He also reports chronic R calf pain in which it sometimes turns red/purple. Pt reports ~1 week PTA, his L calf began to hurt, but did not note warmth or redness on the L but slight warmth on the R noted today. MD and RN notified. At this time, pt is demonstrating L lower extremity weakness and L biceps weakness compared to his R side, resulting in him dragging his L foot when stepping. Notified RN and MD. Pt demonstrated deficits in balance, strength, power, and activity tolerance, requiring minA for bed mobility, transfers, and to take just a couple steps with a RW today. Deferred further mobility for pt safety due to his instability and lack of +2 to provide a chair follow. Due to pt's recent functional decline  and lack of support at home, recommending short-term rehab at a SNF. Will continue to follow acutely and assess pt's progress. If pt progresses to being able to perform transfers to a wheelchair independently, he may be able to go home with HHPT and [place] services.      Recommendations for follow up therapy are one component of a multi-disciplinary discharge planning process, led by the attending physician.  Recommendations may be updated based on patient status, additional functional criteria and insurance authorization.  Follow Up Recommendations Skilled nursing-short term rehab (<3 hours/day) (may be able to progress to home with HHPT/services) Can patient physically be transported by private vehicle: Yes    Assistance Recommended at Discharge Intermittent Supervision/Assistance  Patient can return home with the following  A little help with walking and/or transfers;A little help with bathing/dressing/bathroom;Assistance with cooking/housework;Assist for transportation;Help with stairs or ramp for entrance    Equipment Recommendations BSC/3in1;Rollator (4 wheels) (bariatric; drop-arms)  Recommendations for Other Services  OT consult    Functional Status Assessment Patient has had a recent decline in their functional status and demonstrates the ability to make significant improvements in function in a reasonable and predictable amount of time.     Precautions / Restrictions Precautions Precautions: Fall Restrictions Weight Bearing Restrictions: No      Mobility  Bed Mobility Overal bed mobility: Needs Assistance Bed Mobility: Supine to Sit, Sit to Supine     Supine to sit: Min assist, HOB elevated Sit to supine: Min guard, HOB elevated   General bed mobility comments:  MinA to lift trunk to sit up EOB as pt displayed increased effort/difficulty completing this task. Min guard to return to supine.    Transfers Overall transfer level: Needs assistance Equipment used: Rolling  walker (2 wheels) Transfers: Sit to/from Stand Sit to Stand: Min assist, From elevated surface           General transfer comment: MinA for stability coming to stand from edge of stretcher.    Ambulation/Gait Ambulation/Gait assistance: Min assist Gait Distance (Feet): 2 Feet Assistive device: Rolling walker (2 wheels) Gait Pattern/deviations: Decreased dorsiflexion - left, Decreased stride length, Step-to pattern, Decreased step length - right, Decreased step length - left Gait velocity: reduced Gait velocity interpretation: <1.31 ft/sec, indicative of household ambulator   General Gait Details: Pt with shaky, unsteady balance and noted L foot drag intermittently. Pt reporting L leg felt heavy. MinA for stability taking a couple steps anterior then posterior. Limited distance for pt safety due to instability and no chair follow.  Stairs            Wheelchair Mobility    Modified Rankin (Stroke Patients Only)       Balance Overall balance assessment: Needs assistance Sitting-balance support: Feet supported, No upper extremity supported Sitting balance-Leahy Scale: Fair     Standing balance support: Bilateral upper extremity supported, During functional activity, Reliant on assistive device for balance Standing balance-Leahy Scale: Poor Standing balance comment: Reliant on RW and minA                             Pertinent Vitals/Pain Pain Assessment Pain Assessment: Faces Faces Pain Scale: Hurts little more Pain Location: bil calves Pain Descriptors / Indicators: Discomfort, Grimacing Pain Intervention(s): Limited activity within patient's tolerance, Monitored during session, Repositioned    Home Living Family/patient expects to be discharged to:: Private residence Living Arrangements: Alone Available Help at Discharge: Family;Available PRN/intermittently (they all work) Type of Home: Apartment Home Access: Ramped entrance       Home Layout:  One level Home Equipment: Conservation officer, nature (2 wheels);Shower seat;Grab bars - tub/shower;Grab bars - toilet;Wheelchair - power;Other (comment) (walking stick; adjustable bed)      Prior Function Prior Level of Function : Independent/Modified Independent;Driving             Mobility Comments: Pt is mod I for mobility, often walking without UE support within the home but intermittently holding onto furniture. Pt uses walking stick in the community and can walk up to distances the length of a "football field". Recently, as pt had begun to feel more weak he has began to use a RW and his electric w/c more though, even inside the house.       Hand Dominance   Dominant Hand: Left    Extremity/Trunk Assessment   Upper Extremity Assessment Upper Extremity Assessment: LUE deficits/detail;RUE deficits/detail RUE Deficits / Details: hx of tingling intermittently in R hand; slight tremor noted bil LUE Deficits / Details: noted L biceps weakness compared to R, MMT of 5 on R, 4 on L; denied numbness/tingling on L; slight tremor noted bil    Lower Extremity Assessment Lower Extremity Assessment: RLE deficits/detail;LLE deficits/detail RLE Deficits / Details: MMT scores of 4+ hip flexion, 4+ knee extension, 5 ankle dorsiflexion; numbness/tingling noted bil; reports chronic calf pain with intermittent red/purple discoloration, slightly warmer to palpation (notified MD and RN) RLE Sensation: decreased light touch LLE Deficits / Details: MMT scores of 4 hip flexion, 4  knee extension, 4- ankle dorsiflexion; numbness/tingling noted bil; new pain in calf began ~1 week PTA, did not note warmth or redness though (notified MD and RN) LLE Sensation: decreased light touch    Cervical / Trunk Assessment Cervical / Trunk Assessment: Other exceptions Cervical / Trunk Exceptions: increased body habitus  Communication   Communication: No difficulties  Cognition Arousal/Alertness: Awake/alert Behavior During  Therapy: WFL for tasks assessed/performed Overall Cognitive Status: Within Functional Limits for tasks assessed                                 General Comments: Follows commands appropriately        General Comments      Exercises     Assessment/Plan    PT Assessment Patient needs continued PT services  PT Problem List Decreased strength;Decreased activity tolerance;Decreased balance;Decreased mobility;Impaired sensation;Obesity       PT Treatment Interventions Gait training;DME instruction;Functional mobility training;Therapeutic activities;Therapeutic exercise;Balance training;Neuromuscular re-education;Patient/family education;Wheelchair mobility training    PT Goals (Current goals can be found in the Care Plan section)  Acute Rehab PT Goals Patient Stated Goal: to get better to go home and not stay in a nursing home long term PT Goal Formulation: With patient Time For Goal Achievement: 01/05/22 Potential to Achieve Goals: Good    Frequency Min 3X/week     Co-evaluation               AM-PAC PT "6 Clicks" Mobility  Outcome Measure Help needed turning from your back to your side while in a flat bed without using bedrails?: A Little Help needed moving from lying on your back to sitting on the side of a flat bed without using bedrails?: A Little Help needed moving to and from a bed to a chair (including a wheelchair)?: A Little Help needed standing up from a chair using your arms (e.g., wheelchair or bedside chair)?: A Little Help needed to walk in hospital room?: Total Help needed climbing 3-5 steps with a railing? : Total 6 Click Score: 14    End of Session Equipment Utilized During Treatment: Gait belt Activity Tolerance: Patient tolerated treatment well Patient left: in bed;with call bell/phone within reach Nurse Communication: Mobility status;Other (comment) (calf pain and L sided weakness noted) PT Visit Diagnosis: Unsteadiness on feet  (R26.81);Other abnormalities of gait and mobility (R26.89);Muscle weakness (generalized) (M62.81);Difficulty in walking, not elsewhere classified (R26.2);History of falling (Z91.81)    Time: 5400-8676 PT Time Calculation (min) (ACUTE ONLY): 35 min   Charges:   PT Evaluation $PT Eval Moderate Complexity: 1 Mod PT Treatments $Therapeutic Activity: 8-22 mins        Moishe Spice, PT, DPT Acute Rehabilitation Services  Office: [phone]   Orvan Falconer 12/22/2021, 3:46 PM

## 2021-12-22 NOTE — ED Provider Notes (Signed)
[facility] EMERGENCY DEPARTMENT Provider Note   CSN: [id number] Arrival date & time: 12/21/21  2316     History  Chief Complaint  Patient presents with   Jorge [name] is a 76 y.o. male.  Pt is a 76 yo male with a pmhx significant for neuropathy and ambulatory dysfunction (wheelchair except for brief periods of standing with transfers), HTN, HLD, CHF, Afib on Eliquis, CVA, CKD, PVD, morbid obesity, anxiety and depression.  Pt said he was getting ready for bed.  He had was transferring from his wheelchair to his bed when his legs gave out and he fell down.  His back hit the bed and he landed on his bottom.  He denies hitting his head.  He was unable to get up, so he called EMS who brought him here.  He waited for nearly 9 hours before getting a room.  He is in more pain after sitting on a hard chair for a long time.  He has not taken his am meds b/c he's been here.       Home Medications Prior to Admission medications   Medication Sig Start Date End Date Taking? Authorizing Provider  acetaminophen (TYLENOL) 650 MG CR tablet Take 1,300 mg by mouth every 8 (eight) hours as needed for pain.   Yes [provider]  amiodarone (PACERONE) 200 MG tablet Take 1 tablet (200 mg total) by mouth daily. 08/05/21  Yes Wendall Stade, MD  apixaban (ELIQUIS) 5 MG TABS tablet Take 1 tablet (5 mg total) by mouth 2 (two) times daily. 04/26/20  Yes Rai, Ripudeep K, MD  atorvastatin (LIPITOR) 40 MG tablet Take 40 mg by mouth at bedtime. 04/29/19  Yes [provider]  DULoxetine (CYMBALTA) 60 MG capsule Take 60 mg by mouth daily. 12/11/21  Yes [provider]  furosemide (LASIX) 20 MG tablet Take 20 mg by mouth 2 (two) times daily. 03/26/20  Yes [provider]  levothyroxine (SYNTHROID) 175 MCG tablet Take 175 mcg by mouth daily. 12/12/21  Yes [provider]  metoprolol tartrate (LOPRESSOR) 25 MG tablet Take 25 mg by mouth 2 (two) times  daily. 04/09/19  Yes [provider]  Multiple Vitamin (MULTIVITAMIN) capsule Take 1 capsule by mouth daily.   Yes [provider]  nitroGLYCERIN (NITROSTAT) 0.4 MG SL tablet Place 1 tablet (0.4 mg total) under the tongue every 5 (five) minutes as needed for chest pain. 12/05/17  Yes Kathlen Mody, MD  omeprazole (PRILOSEC) 40 MG capsule Take 40 mg by mouth at bedtime.   Yes [provider]  oxybutynin (DITROPAN-XL) 10 MG 24 hr tablet Take 10 mg by mouth daily. 11/11/21  Yes [provider]  phenylephrine (NEO-SYNEPHRINE) 0.25 % nasal spray Place 1 spray into both nostrils every 6 (six) hours as needed for congestion.   Yes [provider]  polyethylene glycol powder (GLYCOLAX/MIRALAX) powder Take 17 g by mouth daily. Patient taking differently: Take 17 g by mouth daily as needed (constipation). 03/01/17  Yes Delo, Riley Lam, MD  pregabalin (LYRICA) 150 MG capsule Take 150 mg by mouth 2 (two) times daily.   Yes [provider]      Allergies    Patient has no known allergies.    Review of Systems   Review of Systems  Musculoskeletal:  Positive for back pain.       Tailbone pain  All other systems reviewed and are negative.   Physical Exam Updated Vital  Signs BP (!) 148/125   Pulse (!) 50   Temp 98.6 F (37 C)   Resp 16   SpO2 98%  Physical Exam Vitals and nursing note reviewed.  Constitutional:      Appearance: Normal appearance.  HENT:     Head: Normocephalic and atraumatic.     Right Ear: External ear normal.     Left Ear: External ear normal.     Nose: Nose normal.     Mouth/Throat:     Mouth: Mucous membranes are moist.     Pharynx: Oropharynx is clear.  Eyes:     Extraocular Movements: Extraocular movements intact.     Conjunctiva/sclera: Conjunctivae normal.     Pupils: Pupils are equal, round, and reactive to light.  Cardiovascular:     Rate and Rhythm: Regular rhythm. Bradycardia present.     Pulses: Normal pulses.      Heart sounds: Normal heart sounds.  Pulmonary:     Effort: Pulmonary effort is normal.     Breath sounds: Normal breath sounds.  Abdominal:     General: Abdomen is flat. Bowel sounds are normal.     Palpations: Abdomen is soft.  Musculoskeletal:       Arms:     Cervical back: Normal range of motion and neck supple.  Skin:    General: Skin is warm.     Capillary Refill: Capillary refill takes less than 2 seconds.     Comments: Yeast under pannus  Neurological:     General: No focal deficit present.     Mental Status: He is alert and oriented to person, place, and time.     Comments: Numbness BLE (chronic); weakness BLE (chronic)  Psychiatric:        Mood and Affect: Mood normal.        Behavior: Behavior normal.     ED Results / Procedures / Treatments   Labs (all labs ordered are listed, but only abnormal results are displayed) Labs Reviewed  BASIC METABOLIC PANEL - Abnormal; Notable for the following components:      Result Value   Glucose, Bld 109 (*)    BUN 31 (*)    Creatinine, Ser 1.81 (*)    Calcium 8.7 (*)    GFR, Estimated 38 (*)    All other components within normal limits  CBG MONITORING, ED - Abnormal; Notable for the following components:   Glucose-Capillary 101 (*)    All other components within normal limits  CBC  URINALYSIS, ROUTINE W REFLEX MICROSCOPIC    EKG EKG Interpretation  Date/Time:  Saturday December 21 2021 23:42:58 EDT Ventricular Rate:  58 PR Interval:  220 QRS Duration: 134 QT Interval:  470 QTC Calculation: 461 R Axis:   -57 Text Interpretation: Sinus bradycardia with 1st degree A-V block Left axis deviation Left ventricular hypertrophy with QRS widening and repolarization abnormality ( R in aVL , Cornell product ) Cannot rule out Septal infarct , age undetermined Abnormal ECG When compared with ECG of 09-Sep-2020 21:24, PREVIOUS ECG IS PRESENT Since last tracing rate slower Confirmed by Jacalyn Lefevre [phone]) on 12/22/2021  8:01:58 AM  Radiology MR LUMBAR SPINE WO CONTRAST  Result Date: 12/22/2021 CLINICAL DATA:  Low back pain, cauda equina syndrome suspected EXAM: MRI LUMBAR SPINE WITHOUT CONTRAST TECHNIQUE: Multiplanar, multisequence MR imaging of the lumbar spine was performed. No intravenous contrast was administered. COMPARISON:  CT lumbar spine 12/22/2021 FINDINGS: Segmentation:  5 lumbar vertebra.  Lowest disc space L5-S1. Alignment:  Mild  retrolisthesis L2-3 and L3-4. Vertebrae:  Negative for fracture or mass Conus medullaris and cauda equina: Conus extends to the L1-2 level. Conus medullaris normal. Mild tortuosity of the cauda quinine below the conus medullaris due to severe spinal stenosis at L3-4 Paraspinal and other soft tissues: Negative for paraspinous mass or adenopathy Disc levels: L1-2: Mild disc and mild facet degeneration.  Mild spinal stenosis. L2-3: Disc degeneration with disc bulging and endplate spurring. Bilateral facet hypertrophy. Moderate central canal stenosis. Moderate subarticular stenosis bilaterally L3-4: Severe spinal stenosis. Diffuse disc bulging with endplate spurring and bilateral facet hypertrophy. Severe subarticular stenosis bilaterally L4-5: Disc degeneration with diffuse endplate spurring and bilateral facet hypertrophy. Moderate central canal stenosis. Moderate subarticular and foraminal stenosis bilaterally due to spurring L5-S1: Bilateral cage fusion.  Negative for stenosis. IMPRESSION: Moderate central canal stenosis L2-3 with moderate subarticular stenosis bilaterally Severe central canal stenosis at L3-4 with severe subarticular stenosis bilaterally Moderate central canal stenosis L4-5 with moderate subarticular and foraminal stenosis bilaterally. Electronically Signed   By: Marlan Palauharles  Clark M.D.   On: 12/22/2021 15:12   CT Lumbar Spine Wo Contrast  Result Date: 12/22/2021 CLINICAL DATA:  76 year old male status post fall.  Pain. EXAM: CT LUMBAR SPINE WITHOUT CONTRAST TECHNIQUE:  Multidetector CT imaging of the lumbar spine was performed without intravenous contrast administration. Multiplanar CT image reconstructions were also generated. RADIATION DOSE REDUCTION: This exam was performed according to the departmental dose-optimization program which includes automated exposure control, adjustment of the mA and/or kV according to patient size and/or use of iterative reconstruction technique. COMPARISON:  [facility]umbar [facility]ographs [facility]/18/2021 FINDINGS: Segmentation: Normal. Alignment: Stable chronic straightening of lumbar lordosis and subtle levoconvex lumbar scoliosis. No significant spondylolisthesis. Vertebrae: Chronic L5-S1 interbody fusion with cage type interbody implants, stable. Solid arthrodesis there. Visible lower thoracic levels appear stable and intact. Lumbar vertebrae appear stable and intact. Visible sacrum and SI joints appear stable and intact. Paraspinal and other soft tissues: Stable visible noncontrast abdominal viscera. Aortoiliac calcified atherosclerosis. Partially visible moderately to severely distended urinary bladder. Lumbar paraspinal soft tissues are stable. Disc levels: Advanced lumbar spine degeneration. Progression of degenerative vacuum disc since last year throughout the lumbar and visible lower thoracic spine. IMPRESSION: 1. No acute traumatic injury identified in the lumbar spine. Chronic L5-S1 fusion with solid arthrodesis. Severe lumbar disc degeneration. 2. Partially visible moderately to severely distended urinary bladder. Query Urinary Retention. 3. Aortic Atherosclerosis (ICD10-I70.0). Electronically Signed   By: Odessa FlemingH  Hall M.D.   On: 12/22/2021 09:03   DG Hip Unilat With Pelvis 2-3 Views Right  Result Date: 12/22/2021 CLINICAL DATA:  Right hip pain, no known injury, initial encounter EXAM: DG HIP (WITH OR WITHOUT PELVIS) 3V RIGHT COMPARISON:  None Available. FINDINGS: Pelvic ring is intact. No acute  fracture or dislocation is noted. No soft tissue abnormality is noted. IMPRESSION: No acute abnormality seen. Electronically Signed   By: Alcide CleverMark  Lukens M.D.   On: 12/22/2021 00:56   DG Hip Unilat With Pelvis 2-3 Views Left  Result Date: 12/22/2021 CLINICAL DATA:  Chronic left hip pain, no known injury, initial encounter EXAM: DG HIP (WITH OR WITHOUT PELVIS) 3V LEFT COMPARISON:  None Available. FINDINGS: Pelvic ring is intact. Postsurgical changes are noted at L5-S1. No acute fracture or dislocation is noted. No soft tissue changes are seen. IMPRESSION: No acute abnormality in the left hip. Electronically Signed   By: Alcide CleverMark  Lukens M.D.   On: 12/22/2021 00:55   DG  Lumbar Spine Complete  Result Date: 12/22/2021 CLINICAL DATA:  Fall. EXAM: LUMBAR SPINE - COMPLETE 4+ VIEW COMPARISON:  Lumbar spine x-ray 03/18/2016 FINDINGS: There is no evidence of lumbar spine fracture. Alignment is normal. Disc spacer is seen at L5-S1. There is moderate disc space narrowing at L2-L3, L3-L4 and L4-L5 with endplate osteophyte formation, sclerosis and vacuum disc phenomena compatible with degenerative change. There are atherosclerotic calcifications of the aorta. The colon is air-filled and dilated in the central abdomen. IMPRESSION: 1. No evidence for fracture or malalignment. 2. Moderate multilevel degenerative changes. 3. The colon is air-filled and dilated in the central abdomen. Please correlate clinically. Consider further evaluation with dedicated abdominal series or CT. Electronically Signed   By: Ronney Asters M.D.   On: 12/22/2021 00:50    Procedures Procedures    Medications Ordered in ED Medications  nystatin (MYCOSTATIN/NYSTOP) topical powder (0 g Topical Hold 12/22/21 1256)  LORazepam (ATIVAN) injection 1 mg (0 mg Intramuscular Hold 12/22/21 1335)  amiodarone (PACERONE) tablet 200 mg (has no administration in time range)  apixaban (ELIQUIS) tablet 5 mg (has no administration in time range)  atorvastatin  (LIPITOR) tablet 40 mg (has no administration in time range)  DULoxetine (CYMBALTA) DR capsule 60 mg (has no administration in time range)  furosemide (LASIX) tablet 20 mg (has no administration in time range)  levothyroxine (SYNTHROID) tablet 175 mcg (has no administration in time range)  metoprolol tartrate (LOPRESSOR) tablet 25 mg (has no administration in time range)  multivitamin with minerals tablet 1 tablet (has no administration in time range)  pantoprazole (PROTONIX) EC tablet 80 mg (has no administration in time range)  oxybutynin (DITROPAN-XL) 24 hr tablet 10 mg (has no administration in time range)  pregabalin (LYRICA) capsule 150 mg (has no administration in time range)  polyethylene glycol (MIRALAX / GLYCOLAX) packet 17 g (has no administration in time range)  HYDROcodone-acetaminophen (NORCO/VICODIN) 5-325 MG per tablet 1-2 tablet (has no administration in time range)  nystatin cream (MYCOSTATIN) (has no administration in time range)  morphine (PF) 4 MG/ML injection 4 mg (4 mg Intramuscular Given 12/22/21 1256)    ED Course/ Medical Decision Making/ A&P                           Medical Decision Making Amount and/or Complexity of Data Reviewed Radiology: ordered.  Risk Prescription drug management.   This patient presents to the ED for concern of back pain after fall, this involves an extensive number of treatment options, and is a complaint that carries with it a high risk of complications and morbidity.  The differential diagnosis includes fx, contusion   Co morbidities that complicate the patient evaluation  neuropathy and ambulatory dysfunction (wheelchair except for brief periods of standing with transfers), HTN, HLD, CHF, Afib on Eliquis, CVA, CKD, PVD, morbid obesity, anxiety and depression   Additional history obtained:  Additional history obtained from epic chart review   Lab Tests:  I Ordered, and personally interpreted labs.  The pertinent results  include:  cbc nl, bmp with bun 31 and cr 1.81 (1 yr ago, his bun was 29 and cr 1.78); ua nl   Imaging Studies ordered:  I ordered imaging studies including ct lumbar  I independently visualized and interpreted imaging which showed  CT lumbar: IMPRESSION:  1. No acute traumatic injury identified in the lumbar spine. Chronic  L5-S1 fusion with solid arthrodesis. Severe lumbar disc  degeneration.  2. Partially visible  moderately to severely distended urinary  bladder. Query Urinary Retention.  3. Aortic Atherosclerosis (ICD10-I70.0).  Right hip: IMPRESSION:  No acute abnormality seen.  L hip: IMPRESSION:  No acute abnormality in the left hip.  MRI lumbar: IMPRESSION:  Moderate central canal stenosis L2-3 with moderate subarticular  stenosis bilaterally    Severe central canal stenosis at L3-4 with severe subarticular  stenosis bilaterally    Moderate central canal stenosis L4-5 with moderate subarticular and  foraminal stenosis bilaterally.   I agree with the radiologist interpretation   Cardiac Monitoring:  The patient was maintained on a cardiac monitor.  I personally viewed and interpreted the cardiac monitored which showed an underlying rhythm of: sinus brady   Medicines ordered and prescription drug management:  I ordered medication including morphine  for pain  Reevaluation of the patient after these medicines showed that the patient improved I have reviewed the patients home medicines and have made adjustments as needed   Test Considered:  Mri   Critical Interventions:  foley   Consultations Obtained:  I requested consultation with the sw,  and discussed lab and imaging findings as well as pertinent plan - they talked with pt and he is very worried about going home and caring for himself.   PT did consult and recommend a SNF.  I will order meds/diet and put pt in toc border status. Pt d/w NS who recommends outpatient f/u   Problem List / ED  Course:  Urinary retention:  pt said he's had dribbling for the past few days.  He was unable to urinate here despite a large bladder.  He had 1500 cc in his bladder when the nurse did the in and out cath.  So, a foley was placed.  Leg bag band too small for pt's large legs, so he can't use a leg bag. Back pain:  urinary retention likely occurred prior to the fall, but MRI obtained to make sure he did not have cord compression.  MRI showed spinal stenosis, but no evidence of cord compression. Intertrigo: nystatin crm ordered   Reevaluation:  After the interventions noted above, I reevaluated the patient and found that they have :improved   Social Determinants of Health:  Lives alone   Dispostion:  After consideration of the diagnostic results and the patients response to treatment, I feel that the patent would benefit from pt eval and rehab.          Final Clinical Impression(s) / ED Diagnoses Final diagnoses:  Acute urinary retention  Fall, initial encounter  Intertrigo    Rx / DC Orders ED Discharge Orders     None         Jacalyn Lefevre, MD 12/22/21 [phone]

## 2021-12-23 NOTE — Progress Notes (Signed)
CSW is waiting to hear back from [facility] to see if they can take patient. CSW will present other bed offers to patient if patient chooses to go to SNF vs home.

## 2021-12-23 NOTE — Progress Notes (Signed)
CSW spoke with Archie Patten with admissions from [facility] who stated they can make a bed offer on patient. CSW notified patient of his top choice and patient stated her wants to go home. CSW asked patient if he was sure and patient stated he wants to go home. Patient declining SNF.

## 2021-12-23 NOTE — Discharge Planning (Signed)
Kanden Carey J. Byanca Kasper, RN, BSN, NCM [phone] RNCM spoke with pt at bedside regarding discharge planning for Home Health Services. Offered pt [email or web address] list of home health agencies to choose from.  Pt chose [facility] to render services. Cheryl of [facility] notified. Patient made aware that [facility] will be in contact in 24-48 hours.  No DME needs identified at this time.  

## 2021-12-23 NOTE — Evaluation (Signed)
Occupational Therapy Evaluation Patient Details Name: Jorge [name] MRN: [id number] DOB: October 31, 1945 Today's Date: 12/23/2021   History of Present Illness Pt is a 76 y.o. male who presented 12/21/21 s/p fall and progressive lower extremity weakness. CT showed no acute traumatic injury in lumbar spine but distended urinary bladder. 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 PMH includes paroxysmal atrial fibrillation, CKD stage III, CHF, PVD, HTN, anxiety disorder, heart murmur, CVA affecting R side, HLD, afib, asthma.   Clinical Impression   PTA, pt lives alone, typically Modified Independent with ADLs, IADLs, mobility and driving using power wheelchair vs cane for short distances. Pt presents now with primary deficit being R LE pain (around calf area) and minor deficits in endurance and standing balance. Pt able to demonstrate bathroom mobility using RW at min guard with slow pace. Pt requires Min A for UB ADL and up to Mod A for LB ADLs at this time. Pt appears to have demo improved performance from yesterday's therapy session, declines SNF and opting for home at DC. Feel pt will progress well at home with [facility] services. Will follow acutely.  HR 40-50s     Recommendations for follow up therapy are one component of a multi-disciplinary discharge planning process, led by the attending physician.  Recommendations may be updated based on patient status, additional functional criteria and insurance authorization.   Follow Up Recommendations  Home health OT    Assistance Recommended at Discharge Intermittent Supervision/Assistance  Patient can return home with the following A little help with bathing/dressing/bathroom;Assistance with cooking/housework;Assist for transportation;Help with stairs or ramp for entrance    Functional Status Assessment  Patient has had a recent decline in their functional status and demonstrates the ability to make significant improvements in function in a  reasonable and predictable amount of time.  Equipment Recommendations  None recommended by OT (appears well equipped)    Recommendations for Other Services       Precautions / Restrictions Precautions Precautions: Fall Restrictions Weight Bearing Restrictions: No      Mobility Bed Mobility Overal bed mobility: Needs Assistance Bed Mobility: Supine to Sit, Sit to Supine     Supine to sit: Supervision, HOB elevated Sit to supine: Supervision   General bed mobility comments: increased time/effort with HOB elevated    Transfers Overall transfer level: Needs assistance Equipment used: Rolling walker (2 wheels) Transfers: Sit to/from Stand Sit to Stand: From elevated surface, Supervision           General transfer comment: able to stand from bedside and toilet without physical assist with RW for support      Balance Overall balance assessment: Needs assistance Sitting-balance support: Feet supported, No upper extremity supported Sitting balance-Leahy Scale: Good     Standing balance support: Bilateral upper extremity supported, During functional activity, Reliant on assistive device for balance Standing balance-Leahy Scale: Fair Standing balance comment: able to stand briefly w/o UE support, BUE support beneficial for mobility                           ADL either performed or assessed with clinical judgement   ADL Overall ADL's : Needs assistance/impaired Eating/Feeding: Independent;Sitting   Grooming: Supervision/safety;Standing   Upper Body Bathing: Minimal assistance;Sitting   Lower Body Bathing: Sit to/from stand;Moderate assistance   Upper Body Dressing : Set up;Sitting   Lower Body Dressing: Moderate assistance;Sit to/from stand   Toilet Transfer: Min guard;Ambulation;Rolling walker (2 wheels);Comfort height toilet;Grab  bars Toilet Transfer Details (indicate cue type and reason): slow gait impacted by R LE discomfort. able to mobilize  outside of ED room next door to bathroom Toileting- Clothing Manipulation and Hygiene: Minimal assistance;Sitting/lateral lean;Sit to/from stand Toileting - Clothing Manipulation Details (indicate cue type and reason): assist for hygiene d/t difficulty reaching     Functional mobility during ADLs: Min guard;Rolling walker (2 wheels)       Vision Baseline Vision/History: 1 Wears glasses Ability to See in Adequate Light: 0 Adequate Patient Visual Report: No change from baseline Vision Assessment?: No apparent visual deficits     Perception     Praxis      Pertinent Vitals/Pain Pain Assessment Pain Assessment: Faces Faces Pain Scale: Hurts little more Pain Location: R calf Pain Descriptors / Indicators: Burning, Discomfort, Sore Pain Intervention(s): Monitored during session, Premedicated before session     Hand Dominance Left   Extremity/Trunk Assessment Upper Extremity Assessment Upper Extremity Assessment: RUE deficits/detail RUE Deficits / Details: hx of tingling intermittently in R hand; slight tremor noted bil   Lower Extremity Assessment Lower Extremity Assessment: Defer to PT evaluation   Cervical / Trunk Assessment Cervical / Trunk Assessment: Other exceptions Cervical / Trunk Exceptions: increased body habitus   Communication Communication Communication: No difficulties   Cognition Arousal/Alertness: Awake/alert Behavior During Therapy: WFL for tasks assessed/performed Overall Cognitive Status: Within Functional Limits for tasks assessed                                       General Comments  HR brady 40-50s    Exercises     Shoulder Instructions      Home Living Family/patient expects to be discharged to:: Private residence Living Arrangements: Alone Available Help at Discharge: Family;Available PRN/intermittently Type of Home: Apartment Home Access: Ramped entrance     Home Layout: One level     Bathroom Shower/Tub:  Teacher, early years/pre: Standard Bathroom Accessibility: Yes How Accessible: Accessible via wheelchair Home Equipment: Rolling Walker (2 wheels);Shower seat;Grab bars - tub/shower;Grab bars - toilet;Wheelchair - power;Other (comment) (adjustable bed, walking stick)          Prior Functioning/Environment Prior Level of Function : Independent/Modified Independent;Driving             Mobility Comments: Pt is mod I for mobility, often walking without UE support within the home but intermittently holding onto furniture. Pt uses walking stick in the community and can walk up to distances the length of a "football field". Recently, as pt had begun to feel more weak he has began to use a RW and his electric w/c more though, even inside the house.can walk to/from grocery store from parking lot but uses cart in store ADLs Comments: Mod I for ADLs, IADLs in the home, grocery shopping on good days. showers vs sponge bathing depending on how pt feels        OT Problem List: Decreased strength;Decreased activity tolerance;Impaired balance (sitting and/or standing);Pain      OT Treatment/Interventions: Self-care/ADL training;Therapeutic exercise;DME and/or AE instruction;Energy conservation;Therapeutic activities    OT Goals(Current goals can be found in the care plan section) Acute Rehab OT Goals Patient Stated Goal: go home OT Goal Formulation: With patient Time For Goal Achievement: 01/06/22 Potential to Achieve Goals: Good  OT Frequency: Min 2X/week    Co-evaluation  AM-PAC OT "6 Clicks" Daily Activity     Outcome Measure Help from another person eating meals?: None Help from another person taking care of personal grooming?: A Little Help from another person toileting, which includes using toliet, bedpan, or urinal?: A Little Help from another person bathing (including washing, rinsing, drying)?: A Little Help from another person to put on and taking off  regular upper body clothing?: A Little Help from another person to put on and taking off regular lower body clothing?: A Lot 6 Click Score: 18   End of Session Equipment Utilized During Treatment: Rolling walker (2 wheels);Gait belt Nurse Communication: Mobility status  Activity Tolerance: Patient tolerated treatment well Patient left: in bed;with call bell/phone within reach  OT Visit Diagnosis: Unsteadiness on feet (R26.81);Other abnormalities of gait and mobility (R26.89)                Time: [id number] OT Time Calculation (min): 26 min Charges:  OT General Charges $OT Visit: 1 Visit OT Evaluation $OT Eval Moderate Complexity: 1 Mod  [name], OTR/L Acute Rehab Services Office: [phone]   Layla Maw 12/23/2021, 1:09 PM
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
## 2021-12-23 NOTE — ED Notes (Signed)
Pt ambulatory to bathroom with walker and PT. Pt had bowel movement.
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
## 2021-12-23 NOTE — ED Notes (Signed)
PTAR called  

## 2021-12-23 NOTE — ED Notes (Addendum)
MD Mayra Neer notified about pts red urine.

## 2021-12-23 NOTE — Progress Notes (Signed)
Physical Therapy Treatment Patient Details Name: Jorge [name] MRN: [id number] DOB: 07-Sep-1945 Today's Date: 12/23/2021   History of Present Illness Pt is a 76 y.o. male who presented 12/21/21 s/p fall and progressive lower extremity weakness. CT showed no acute traumatic injury in lumbar spine but distended urinary bladder. 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 PMH includes paroxysmal atrial fibrillation, CKD stage III, CHF, PVD, HTN, anxiety disorder, heart murmur, CVA affecting R side, HLD, afib, asthma.    PT Comments    Pt making good progress today.  He was able to ambulate 20'x2 with min guard and RW.  Pt still reports R calf and foot pain with standing - describes as sharp, pins/needles, stepping on glass.  Reports hx of neuropathy but pain worsened.  Pt asking about further pain meds - notified RN who reports pt medicated with ordered meds.  Negative Homan's sign for DVT.  Noted R knee with edema and erythema but pt denies any pain in knee.   Pt reports he does not want to go to SNF at d/c.  He has DME at home including power wheelchair and home is accessible.  Pt seems aware of his limitations and has necessary DME.  Updated recommendations to HHPT.     Recommendations for follow up therapy are one component of a multi-disciplinary discharge planning process, led by the attending physician.  Recommendations may be updated based on patient status, additional functional criteria and insurance authorization.  Follow Up Recommendations  Home health PT Can patient physically be transported by private vehicle: Yes   Assistance Recommended at Discharge Intermittent Supervision/Assistance  Patient can return home with the following Help with stairs or ramp for entrance;Assistance with cooking/housework   Equipment Recommendations  None recommended by PT    Recommendations for Other Services       Precautions / Restrictions Precautions Precautions:  Fall Restrictions Weight Bearing Restrictions: No     Mobility  Bed Mobility Overal bed mobility: Needs Assistance Bed Mobility: Supine to Sit, Sit to Supine     Supine to sit: Supervision, HOB elevated Sit to supine: Supervision, HOB elevated   General bed mobility comments: increased time/effort with HOB elevated ; pt has adjustable bed at home    Transfers Overall transfer level: Needs assistance Equipment used: Rolling walker (2 wheels) Transfers: Sit to/from Stand Sit to Stand: From elevated surface, Supervision           General transfer comment: able to stand from bedside and toilet without physical assist with RW for support    Ambulation/Gait Ambulation/Gait assistance: Min guard Gait Distance (Feet): 20 Feet (20'x2) Assistive device: Rolling walker (2 wheels) Gait Pattern/deviations: Decreased dorsiflexion - left, Decreased dorsiflexion - right, Decreased stride length, Shuffle Gait velocity: reduced     General Gait Details: Pt with shuffle gait with increased work of breathing requiring rest breaks. Ambualted to restroom and back.  Reports pain in R LE increases with weight bearing. No LOB.   Stairs             Wheelchair Mobility    Modified Rankin (Stroke Patients Only)       Balance Overall balance assessment: Needs assistance Sitting-balance support: Feet supported, No upper extremity supported Sitting balance-Leahy Scale: Good     Standing balance support: Bilateral upper extremity supported, During functional activity, Reliant on assistive device for balance, No upper extremity supported Standing balance-Leahy Scale: Fair Standing balance comment: able to stand briefly w/o UE support, BUE support beneficial  for mobility                            Cognition Arousal/Alertness: Awake/alert Behavior During Therapy: WFL for tasks assessed/performed Overall Cognitive Status: Within Functional Limits for tasks assessed                                           Exercises      General Comments General comments (skin integrity, edema, etc.): HR brady 40-50's rest; did go up to 70-80 activity.  Pt reports that does not want SNF at d/c. States he would prefer go home with HHPT.  Home is accessible and he has DME needs including adjustable bed and power wheel chair.  Pt reports he knows his limits, when to rest, will use chair, etc.   In regards to R calf pain.  Pt with negative Homan's sign.  Has hx of neuropathy and also spinal stenosis - symptoms seem consistent with combination of stenosis and neuropathy.  Symptoms are worse with weight bearing but present at all times.  Pt asking about further pain meds, notified RN, and discussed with pt orders for meds have to come from MD, therapy can't prescribe meds. Did educated on positions of comfort (pillow under knee) and log roll technique for back pain.     Pertinent Vitals/Pain Pain Assessment Pain Assessment: Faces Faces Pain Scale: Hurts little more Pain Location: R calf and bil feet Pain Descriptors / Indicators: Burning, Discomfort, Sore, Pins and needles Pain Intervention(s): Limited activity within patient's tolerance, Monitored during session, Premedicated before session    [place] expects to be discharged to:: Private residence Living Arrangements: Alone Available Help at Discharge: Family;Available PRN/intermittently Type of Home: Apartment Home Access: Ramped entrance       Home Layout: One level Home Equipment: Conservation officer, nature (2 wheels);Shower seat;Grab bars - tub/shower;Grab bars - toilet;Wheelchair - power;Other (comment) (adjustable bed, walking stick)      Prior Function            PT Goals (current goals can now be found in the care plan section) Progress towards PT goals: Progressing toward goals    Frequency    Min 3X/week      PT Plan Discharge plan needs to be updated    Co-evaluation  PT/OT/SLP Co-Evaluation/Treatment: Yes Reason for Co-Treatment: For patient/therapist safety          AM-PAC PT "6 Clicks" Mobility   Outcome Measure  Help needed turning from your back to your side while in a flat bed without using bedrails?: A Little Help needed moving from lying on your back to sitting on the side of a flat bed without using bedrails?: A Little Help needed moving to and from a bed to a chair (including a wheelchair)?: A Little Help needed standing up from a chair using your arms (e.g., wheelchair or bedside chair)?: A Little Help needed to walk in hospital room?: A Little Help needed climbing 3-5 steps with a railing? : A Lot 6 Click Score: 17    End of Session Equipment Utilized During Treatment: Gait belt Activity Tolerance: Patient limited by pain Patient left: in bed;with call bell/phone within reach Nurse Communication: Mobility status PT Visit Diagnosis: Unsteadiness on feet (R26.81);Other abnormalities of gait and mobility (R26.89);Muscle weakness (generalized) (M62.81);Difficulty in walking, not elsewhere classified (R26.2);History of falling (  Z91.81)     [id number] PT Time Calculation (min) (ACUTE ONLY): 29 min  Charges:  $Gait Training: 8-22 mins                     Abran Richard, PT Acute Rehab [facility] 12/23/2021, 1:44 PM

## 2021-12-23 NOTE — Discharge Instructions (Signed)
Follow-up with your primary doctor to have your symptoms rechecked.  You should return to the emergency department for any new or worsening symptoms.

## 2021-12-23 NOTE — ED Provider Notes (Signed)
Patient was held in the ED overnight pending PT, OT and social work eval for safe disposition.  PT and OT recommended home health. Patient seen and examined at bedside. Complaining of chronic pain. Initially states he would prefer home health over SNF.   Ottie Glazier, DO 12/23/21 1420

## 2021-12-26 ENCOUNTER — Other Ambulatory Visit: Payer: Self-pay

## 2021-12-26 ENCOUNTER — Emergency Department (HOSPITAL_COMMUNITY)
Admission: EM | Admit: 2021-12-26 | Discharge: 2021-12-26 | Disposition: A | Discharge status: Home / Self Care | Payer: Medicare Other | Attending: Emergency Medicine | Admitting: Emergency Medicine

## 2021-12-26 ENCOUNTER — Encounter (HOSPITAL_COMMUNITY): Payer: Self-pay | Admitting: Emergency Medicine

## 2021-12-26 DIAGNOSIS — R319 Hematuria, unspecified: Secondary | ICD-10-CM | POA: Diagnosis present

## 2021-12-26 DIAGNOSIS — I4891 Unspecified atrial fibrillation: Secondary | ICD-10-CM | POA: Diagnosis not present

## 2021-12-26 DIAGNOSIS — Z7901 Long term (current) use of anticoagulants: Secondary | ICD-10-CM | POA: Insufficient documentation

## 2021-12-26 DIAGNOSIS — R310 Gross hematuria: Secondary | ICD-10-CM | POA: Diagnosis not present

## 2021-12-26 DIAGNOSIS — R339 Retention of urine, unspecified: Secondary | ICD-10-CM | POA: Insufficient documentation

## 2021-12-26 DIAGNOSIS — R109 Unspecified abdominal pain: Secondary | ICD-10-CM | POA: Diagnosis not present

## 2021-12-26 LAB — COMPREHENSIVE METABOLIC PANEL
ALT: 17 U/L (ref 0–44)
AST: 19 U/L (ref 15–41)
Albumin: 3.7 g/dL (ref 3.5–5.0)
Alkaline Phosphatase: 100 U/L (ref 38–126)
Anion gap: 13 (ref 5–15)
BUN: 26 mg/dL — ABNORMAL HIGH (ref 8–23)
CO2: 23 mmol/L (ref 22–32)
Calcium: 8.9 mg/dL (ref 8.9–10.3)
Chloride: 103 mmol/L (ref 98–111)
Creatinine, Ser: 1.76 mg/dL — ABNORMAL HIGH (ref 0.61–1.24)
GFR, Estimated: 40 mL/min — ABNORMAL LOW (ref >60)
Glucose, Bld: 146 mg/dL — ABNORMAL HIGH (ref 70–99)
Potassium: 3.8 mmol/L (ref 3.5–5.1)
Sodium: 139 mmol/L (ref 135–145)
Total Bilirubin: 1.1 mg/dL (ref 0.3–1.2)
Total Protein: 7.4 g/dL (ref 6.5–8.1)

## 2021-12-26 LAB — CBC WITH DIFFERENTIAL/PLATELET
Abs Immature Granulocytes: 0.07 10*3/uL (ref 0.00–0.07)
Basophils Absolute: 0 10*3/uL (ref 0.0–0.1)
Basophils Relative: 0 %
Eosinophils Absolute: 0.2 10*3/uL (ref 0.0–0.5)
Eosinophils Relative: 3 %
HCT: 48.3 % (ref 39.0–52.0)
Hemoglobin: 16 g/dL (ref 13.0–17.0)
Immature Granulocytes: 1 %
Lymphocytes Relative: 24 %
Lymphs Abs: 1.9 10*3/uL (ref 0.7–4.0)
MCH: 32.3 pg (ref 26.0–34.0)
MCHC: 33.1 g/dL (ref 30.0–36.0)
MCV: 97.4 fL (ref 80.0–100.0)
Monocytes Absolute: 1.1 10*3/uL — ABNORMAL HIGH (ref 0.1–1.0)
Monocytes Relative: 14 %
Neutro Abs: 4.6 10*3/uL (ref 1.7–7.7)
Neutrophils Relative %: 58 %
Platelets: 187 10*3/uL (ref 150–400)
RBC: 4.96 MIL/uL (ref 4.22–5.81)
RDW: 13.4 % (ref 11.5–15.5)
WBC: 8 10*3/uL (ref 4.0–10.5)
nRBC: 0 % (ref 0.0–0.2)

## 2021-12-26 LAB — URINALYSIS, ROUTINE W REFLEX MICROSCOPIC
Bilirubin Urine: NEGATIVE
Glucose, UA: NEGATIVE mg/dL
Ketones, ur: NEGATIVE mg/dL
Leukocytes,Ua: NEGATIVE
Nitrite: NEGATIVE
Protein, ur: 100 mg/dL — AB
RBC / HPF: >50 RBC/hpf — ABNORMAL HIGH (ref 0–5)
Specific Gravity, Urine: 1.006 (ref 1.005–1.030)
pH: 6 (ref 5.0–8.0)

## 2021-12-26 MED ORDER — NITROFURANTOIN MONOHYD MACRO 100 MG PO CAPS
100.0000 mg | ORAL_CAPSULE | Freq: Once | ORAL | Status: AC
  Administered 2021-12-26: 100 mg via ORAL
  Filled 2021-12-26: qty 1

## 2021-12-26 MED ORDER — NITROFURANTOIN MONOHYD MACRO 100 MG PO CAPS: 100.0000 mg | ORAL_CAPSULE | Freq: Two times a day (BID) | ORAL | 0 refills | Status: AC

## 2021-12-26 NOTE — ED Provider Triage Note (Signed)
Emergency Medicine Provider Triage Evaluation Note  Jorge [name] , a 76 y.o. male  was evaluated in triage.  Pt complains of hematuria. Went to bed tonight @ 2100 with no blood in urine, woke up later with bloody urine in his foley catheter that was recently placed for urinary retention. Does not feel that he is retaining. Has had some dysuria. Denies new back/abdominal pain. Chronic neuropathy pain present.  Review of Systems  Per above.   Physical Exam  There were no vitals taken for this visit. Gen:   Awake, no distress   Resp:  Normal effort  MSK:   Moves extremities without difficulty  Other:  Foley catheter bag with hematuria however catheter tubing with clear to yellow colored urine present.   Medical Decision Making  Medically screening exam initiated at 1:15 AM.  Appropriate orders placed.  Jorge [name] was informed that the remainder of the evaluation will be completed by another provider, this initial triage assessment does not replace that evaluation, and the importance of remaining in the ED until their evaluation is complete.  Hematuria.    Amaryllis Dyke, PA-C 12/27/21 0004

## 2021-12-26 NOTE — ED Triage Notes (Addendum)
Patient arrived with EMS from home reports hematuria this evening with dysuria , foley catheter inserted here last Monday for urinary retention . No fever or chills .

## 2021-12-26 NOTE — ED Notes (Signed)
PTAR called  

## 2021-12-26 NOTE — Discharge Instructions (Addendum)
If you develop fever, flank pain, vomiting, new or worsening bleeding, or any other new/concerning symptoms then return to the ER for evaluation.

## 2021-12-26 NOTE — ED Notes (Signed)
PTAR cancelled family found him a ride

## 2021-12-26 NOTE — ED Provider Notes (Signed)
[facility] EMERGENCY DEPARTMENT Provider Note   CSN: [id number] Arrival date & time: 12/26/21  0107     History  Chief Complaint  Patient presents with   Hematuria ( Foley Catheter )    Jorge [name] is a 76 y.o. male.  HPI 76 year old male presents with hematuria.  He had a Foley catheter placed a few days ago for urinary retention.  He is on Eliquis for A-fib.  Last night he noticed his bag had hematuria in it.  He is also been having dysuria since before the catheter was placed.  The abdominal pain he was having when he came in originally for the retention seems to be gone.  No fevers.  Home Medications Prior to Admission medications   Medication Sig Start Date End Date Taking? Authorizing Provider  nitrofurantoin, macrocrystal-monohydrate, (MACROBID) 100 MG capsule Take 1 capsule (100 mg total) by mouth 2 (two) times daily. 12/26/21  Yes Sherwood Gambler, MD  acetaminophen (TYLENOL) 650 MG CR tablet Take 1,300 mg by mouth every 8 (eight) hours as needed for pain.    [provider]  amiodarone (PACERONE) 200 MG tablet Take 1 tablet (200 mg total) by mouth daily. 08/05/21   Josue Hector, MD  apixaban (ELIQUIS) 5 MG TABS tablet Take 1 tablet (5 mg total) by mouth 2 (two) times daily. 04/26/20   Rai, Vernelle Emerald, MD  atorvastatin (LIPITOR) 40 MG tablet Take 40 mg by mouth at bedtime. 04/29/19   [provider]  DULoxetine (CYMBALTA) 60 MG capsule Take 60 mg by mouth daily. 12/11/21   [provider]  furosemide (LASIX) 20 MG tablet Take 20 mg by mouth 2 (two) times daily. 03/26/20   [provider]  levothyroxine (SYNTHROID) 175 MCG tablet Take 175 mcg by mouth daily. 12/12/21   [provider]  metoprolol tartrate (LOPRESSOR) 25 MG tablet Take 25 mg by mouth 2 (two) times daily. 04/09/19   [provider]  Multiple Vitamin (MULTIVITAMIN) capsule Take 1 capsule by mouth daily.    [provider]   nitroGLYCERIN (NITROSTAT) 0.4 MG SL tablet Place 1 tablet (0.4 mg total) under the tongue every 5 (five) minutes as needed for chest pain. 12/05/17   Hosie Poisson, MD  omeprazole (PRILOSEC) 40 MG capsule Take 40 mg by mouth at bedtime.    [provider]  oxybutynin (DITROPAN-XL) 10 MG 24 hr tablet Take 10 mg by mouth daily. 11/11/21   [provider]  phenylephrine (NEO-SYNEPHRINE) 0.25 % nasal spray Place 1 spray into both nostrils every 6 (six) hours as needed for congestion.    [provider]  polyethylene glycol powder (GLYCOLAX/MIRALAX) powder Take 17 g by mouth daily. Patient taking differently: Take 17 g by mouth daily as needed (constipation). 03/01/17   Veryl Speak, MD  pregabalin (LYRICA) 150 MG capsule Take 150 mg by mouth 2 (two) times daily.    [provider]      Allergies    Patient has no known allergies.    Review of Systems   Review of Systems  Constitutional:  Negative for fever.  Gastrointestinal:  Negative for abdominal pain.  Genitourinary:  Positive for dysuria and hematuria. Negative for flank pain.  Musculoskeletal:  Negative for back pain (chronic back pain, no new back/flank pain).    Physical Exam Updated Vital Signs BP 119/80   Pulse (!) 56   Temp 97.9 F (36.6 C)   Resp 16   SpO2 96%  Physical  Exam Vitals and nursing note reviewed.  Constitutional:      Appearance: He is well-developed. He is obese.  HENT:     Head: Normocephalic and atraumatic.  Pulmonary:     Effort: Pulmonary effort is normal.  Abdominal:     Palpations: Abdomen is soft.     Tenderness: There is no abdominal tenderness.  Genitourinary:    Comments: Foley catheter in place.  No obvious penile discharge.  No obvious hematuria in the current Foley catheter bag. Skin:    General: Skin is warm and dry.  Neurological:     Mental Status: He is alert.     ED Results / Procedures / Treatments   Labs (all labs ordered are listed, but  only abnormal results are displayed) Labs Reviewed  CBC WITH DIFFERENTIAL/PLATELET - Abnormal; Notable for the following components:      Result Value   Monocytes Absolute 1.1 (*)    All other components within normal limits  COMPREHENSIVE METABOLIC PANEL - Abnormal; Notable for the following components:   Glucose, Bld 146 (*)    BUN 26 (*)    Creatinine, Ser 1.76 (*)    GFR, Estimated 40 (*)    All other components within normal limits  URINALYSIS, ROUTINE W REFLEX MICROSCOPIC - Abnormal; Notable for the following components:   Color, Urine AMBER (*)    Hgb urine dipstick MODERATE (*)    Protein, ur 100 (*)    RBC / HPF >50 (*)    Bacteria, UA MANY (*)    All other components within normal limits  URINE CULTURE    EKG None  Radiology No results found.  Procedures Procedures    Medications Ordered in ED Medications  nitrofurantoin (macrocrystal-monohydrate) (MACROBID) capsule 100 mg (100 mg Oral Given 12/26/21 [id number])    ED Course/ Medical Decision Making/ A&P                           Medical Decision Making Amount and/or Complexity of Data Reviewed Labs: ordered.  Risk Prescription drug management.   Patient presents with hematuria in his Foley catheter.  Hemoglobin today is normal.  Normal WBC.  CKD is stable.  His urinalysis shows some blood with many bacteria though not overtly UTI.  However with his recent history of dysuria I think is reasonable to treat him with Macrobid.  Urine cultures reviewed and in May 2021 he had ESBL E. coli.  Given he does not have any flank pain, fevers or other signs/symptoms of pyelonephritis, I think Macrobid is a reasonable choice.  Could be that he just has some irritation from having a Foley catheter and being on Eliquis, but I think covering with antibiotics make sense and have him follow-up with his urologist.  He otherwise appears stable for discharge home with return precautions.  No imaging indicated currently.  Vital signs are  okay.        Final Clinical Impression(s) / ED Diagnoses Final diagnoses:  Gross hematuria    Rx / DC Orders ED Discharge Orders          Ordered    nitrofurantoin, macrocrystal-monohydrate, (MACROBID) 100 MG capsule  2 times daily        12/26/21 0905              Sherwood Gambler, MD 12/26/21 1000

## 2021-12-27 LAB — URINE CULTURE: Culture: NO GROWTH

## 2021-12-28 ENCOUNTER — Emergency Department (HOSPITAL_COMMUNITY)
Admission: EM | Admit: 2021-12-28 | Discharge: 2021-12-28 | Disposition: A | Discharge status: Home / Self Care | Payer: Medicare Other | Attending: Emergency Medicine | Admitting: Emergency Medicine

## 2021-12-28 ENCOUNTER — Other Ambulatory Visit: Payer: Self-pay

## 2021-12-28 ENCOUNTER — Encounter (HOSPITAL_COMMUNITY): Payer: Self-pay | Admitting: Emergency Medicine

## 2021-12-28 DIAGNOSIS — R300 Dysuria: Secondary | ICD-10-CM | POA: Diagnosis not present

## 2021-12-28 DIAGNOSIS — Z7901 Long term (current) use of anticoagulants: Secondary | ICD-10-CM | POA: Diagnosis not present

## 2021-12-28 DIAGNOSIS — I509 Heart failure, unspecified: Secondary | ICD-10-CM | POA: Diagnosis not present

## 2021-12-28 DIAGNOSIS — I110 Hypertensive heart disease with heart failure: Secondary | ICD-10-CM | POA: Diagnosis not present

## 2021-12-28 DIAGNOSIS — R319 Hematuria, unspecified: Secondary | ICD-10-CM | POA: Diagnosis not present

## 2021-12-28 LAB — CBC WITH DIFFERENTIAL/PLATELET
Abs Immature Granulocytes: 0.06 10*3/uL (ref 0.00–0.07)
Basophils Absolute: 0 10*3/uL (ref 0.0–0.1)
Basophils Relative: 0 %
Eosinophils Absolute: 0.2 10*3/uL (ref 0.0–0.5)
Eosinophils Relative: 3 %
HCT: 42.9 % (ref 39.0–52.0)
Hemoglobin: 14.5 g/dL (ref 13.0–17.0)
Immature Granulocytes: 1 %
Lymphocytes Relative: 14 %
Lymphs Abs: 1 10*3/uL (ref 0.7–4.0)
MCH: 32.2 pg (ref 26.0–34.0)
MCHC: 33.8 g/dL (ref 30.0–36.0)
MCV: 95.3 fL (ref 80.0–100.0)
Monocytes Absolute: 0.8 10*3/uL (ref 0.1–1.0)
Monocytes Relative: 11 %
Neutro Abs: 5.1 10*3/uL (ref 1.7–7.7)
Neutrophils Relative %: 71 %
Platelets: 182 10*3/uL (ref 150–400)
RBC: 4.5 MIL/uL (ref 4.22–5.81)
RDW: 13.3 % (ref 11.5–15.5)
WBC: 7.2 10*3/uL (ref 4.0–10.5)
nRBC: 0 % (ref 0.0–0.2)

## 2021-12-28 LAB — BASIC METABOLIC PANEL
Anion gap: 6 (ref 5–15)
BUN: 23 mg/dL (ref 8–23)
CO2: 30 mmol/L (ref 22–32)
Calcium: 8.2 mg/dL — ABNORMAL LOW (ref 8.9–10.3)
Chloride: 101 mmol/L (ref 98–111)
Creatinine, Ser: 1.79 mg/dL — ABNORMAL HIGH (ref 0.61–1.24)
GFR, Estimated: 39 mL/min — ABNORMAL LOW (ref >60)
Glucose, Bld: 129 mg/dL — ABNORMAL HIGH (ref 70–99)
Potassium: 3.4 mmol/L — ABNORMAL LOW (ref 3.5–5.1)
Sodium: 137 mmol/L (ref 135–145)

## 2021-12-28 NOTE — ED Provider Notes (Cosign Needed Addendum)
[facility] EMERGENCY DEPARTMENT Provider Note   CSN: [id number] Arrival date & time: 12/28/21  1151     History  Chief Complaint  Patient presents with   Hematuria    Jorge [name] is a 77 y.o. male.  Patient presents to the hospital complaining gross hematuria.  Patient was seen in the hospital on September 16 and diagnosed with acute urinary retention.  Imaging was done at that time which showed no signs of cauda equina or other spinal cord related etiology.  Foley catheter was placed at that time.  The patient is on Eliquis for atrial fibrillation.  On September 21 he returned to the emergency department due to hematuria.  He had been noticing gross blood in his Foley catheter bag.  He complained of mild suprapubic pain and dysuria which reportedly began before the catheter was placed.  At that visit a urinalysis was completed which showed the hematuria and many bacteria.  He was prescribed Macrobid.  Urine culture had no growth since that time.  The patient returns today via EMS due to a continuation of the hematuria.  He stated that his urine cleared up somewhat after discharging from the hospital on Thursday but that today he noticed that the blood was still there and noticed that he was occasionally getting clots which would block the Foley outlet.  He complains of mild suprapubic pain again and dysuria.  Past medical history seen for hypertension, shortness of breath, CHF, A-fib, renal insufficiency, history of stroke, GERD, anxiety, PVD, neuropathy, obesity, depression  HPI     Home Medications Prior to Admission medications   Medication Sig Start Date End Date Taking? Authorizing Provider  acetaminophen (TYLENOL) 650 MG CR tablet Take 1,300 mg by mouth every 8 (eight) hours as needed for pain.    [provider]  amiodarone (PACERONE) 200 MG tablet Take 1 tablet (200 mg total) by mouth daily. 08/05/21   Wendall Stade, MD  apixaban (ELIQUIS) 5 MG TABS  tablet Take 1 tablet (5 mg total) by mouth 2 (two) times daily. 04/26/20   Rai, Delene Ruffini, MD  atorvastatin (LIPITOR) 40 MG tablet Take 40 mg by mouth at bedtime. 04/29/19   [provider]  DULoxetine (CYMBALTA) 60 MG capsule Take 60 mg by mouth daily. 12/11/21   [provider]  furosemide (LASIX) 20 MG tablet Take 20 mg by mouth 2 (two) times daily. 03/26/20   [provider]  levothyroxine (SYNTHROID) 175 MCG tablet Take 175 mcg by mouth daily. 12/12/21   [provider]  metoprolol tartrate (LOPRESSOR) 25 MG tablet Take 25 mg by mouth 2 (two) times daily. 04/09/19   [provider]  Multiple Vitamin (MULTIVITAMIN) capsule Take 1 capsule by mouth daily.    [provider]  nitrofurantoin, macrocrystal-monohydrate, (MACROBID) 100 MG capsule Take 1 capsule (100 mg total) by mouth 2 (two) times daily. 12/26/21   Pricilla Loveless, MD  nitroGLYCERIN (NITROSTAT) 0.4 MG SL tablet Place 1 tablet (0.4 mg total) under the tongue every 5 (five) minutes as needed for chest pain. 12/05/17   Kathlen Mody, MD  omeprazole (PRILOSEC) 40 MG capsule Take 40 mg by mouth at bedtime.    [provider]  oxybutynin (DITROPAN-XL) 10 MG 24 hr tablet Take 10 mg by mouth daily. 11/11/21   [provider]  phenylephrine (NEO-SYNEPHRINE) 0.25 % nasal spray Place 1 spray into both nostrils every 6 (six) hours as needed for congestion.    [provider]  polyethylene glycol powder (GLYCOLAX/MIRALAX) powder Take 17 g by mouth daily. Patient taking differently: Take 17 g by mouth daily as needed (constipation). 03/01/17   Veryl Speak, MD  pregabalin (LYRICA) 150 MG capsule Take 150 mg by mouth 2 (two) times daily.    [provider]      Allergies    Patient has no known allergies.    Review of Systems   Review of Systems  Respiratory:  Negative for shortness of breath.   Cardiovascular:  Negative for chest pain.  Gastrointestinal:   Positive for abdominal pain (Mild suprapubic pain). Negative for nausea and vomiting.  Genitourinary:  Positive for dysuria and hematuria.  Neurological:  Positive for light-headedness (Mild, resolved).    Physical Exam Updated Vital Signs BP (!) 149/58   Pulse (!) 50   Temp 98.4 F (36.9 C) (Oral)   Resp (!) 22   SpO2 95%  Physical Exam Vitals and nursing note reviewed.  Constitutional:      General: He is not in acute distress.    Appearance: He is well-developed.  HENT:     Head: Normocephalic and atraumatic.  Eyes:     Conjunctiva/sclera: Conjunctivae normal.  Cardiovascular:     Rate and Rhythm: Normal rate and regular rhythm.     Heart sounds: No murmur heard. Pulmonary:     Effort: Pulmonary effort is normal. No respiratory distress.     Breath sounds: Normal breath sounds.  Abdominal:     Palpations: Abdomen is soft.     Tenderness: There is no abdominal tenderness.     Comments: No tenderness to palpation of the suprapubic area  Genitourinary:    Comments: Foley catheter in place Musculoskeletal:        General: No signs of injury.     Cervical back: Neck supple.  Skin:    General: Skin is warm and dry.     Capillary Refill: Capillary refill takes less than 2 seconds.  Neurological:     Mental Status: He is alert.  Psychiatric:        Mood and Affect: Mood normal.     ED Results / Procedures / Treatments   Labs (all labs ordered are listed, but only abnormal results are displayed) Labs Reviewed  BASIC METABOLIC PANEL - Abnormal; Notable for the following components:      Result Value   Potassium 3.4 (*)    Glucose, Bld 129 (*)    Creatinine, Ser 1.79 (*)    Calcium 8.2 (*)    GFR, Estimated 39 (*)    All other components within normal limits  CBC WITH DIFFERENTIAL/PLATELET    EKG None  Radiology No results found.  Procedures Procedures    Medications Ordered in ED Medications - No data to display  ED Course/ Medical Decision Making/  A&P                           Medical Decision Making Amount and/or Complexity of Data Reviewed Labs: ordered.   Patient presents with repeat hematuria.  This is most likely due to irritation from the catheter combined with the patient's Eliquis usage.  I reviewed the patient's past medical history including emergency department notes from both September 16 and September 21.  He was seen for weakness on September 16 and it was recommended that the patient go to a skilled nursing facility but the patient declined and requested home health instead.  He  was found at that visit to have urinary retention and the Foley catheter was placed.  He has been home since that time other than the emergency department visit on the 21st due to hematuria.  There was some concern for infection at that time and the patient was started on Macrobid which she states he has been compliant with.  Urine culture from that visit shows no growth.   The patient's catheter was flushed until no clots returned.  The patient seems to be producing urine with no difficulty with free flow into the catheter bag.  There is still gross hematuria.  I ordered and reviewed patient's labs.  Patient's creatinine is at his baseline level.  Unremarkable CBC showing no drop in hemoglobin since the patient's last visit for hematuria.  The patient needs follow-up with [facility].  There seems to be some confusion on the patient's part as to follow-up with urology.  The patient had been recommended to go to a SNF at his visit on September 16 but requested home health instead.  The patient does endorse having home health care at this time.  I explained that the patient needs follow-up with urology for further evaluation and management.  The patient now voices understanding and understands he needs an appointment with their office.  I saw the patient with Dr. Anitra Lauth, attending, and it was recommended that the patient hold his Eliquis for the  next 2 days to help with the bleeding.  He may resume his Eliquis at that point.  There is no indication at this time for admission to the hospital.  The patient may discharge home and continue with home health care.  Patient will call and schedule a follow-up appointment for soon as possible with [facility].        Final Clinical Impression(s) / ED Diagnoses Final diagnoses:  Hematuria, unspecified type    Rx / DC Orders ED Discharge Orders     None         Pamala Duffel 12/28/21 1536    Darrick Grinder, PA-C 12/28/21 1538    Gwyneth Sprout, MD 01/01/22 1324

## 2021-12-28 NOTE — ED Notes (Signed)
Pt's foley bag would not drain the 1600 mL of bloody urine. This RN notified PA , verbal order to change foley bag out. This RN replaced foley bag and will monitor for outflow of urine. Pt tolerated well.

## 2021-12-28 NOTE — ED Triage Notes (Addendum)
Pt arrives via EMS for hematuria. Pt states he woke up this morning and noticed it. Last night he did not have any bloody urine. Pt complained of HA and dizziness for EMS. BP 150/88, HR 57. Pt has a foley catheter for distended bladder. Pt complains of lower abd pain.

## 2021-12-28 NOTE — Discharge Instructions (Addendum)
You were seen today for blood in your urine.  Your lab work was reassuring for no worsening in your kidney function and for no decrease in your hemoglobin levels.  As discussed, hold your Eliquis for the next 2 days.  This should hopefully help with the bleeding.  Please schedule an appointment with [facility] at the number listed within the paperwork as soon as possible for further evaluation and management.  If you develop any life-threatening conditions please return to the emergency department

## 2021-12-28 NOTE — ED Notes (Signed)
Irrigated pt's foley with 1 liter of sterile water. Pt tolerated well. No clots came out during the process, urine still continues to be bloody. Notified PA

## 2022-01-05 ENCOUNTER — Emergency Department (HOSPITAL_BASED_OUTPATIENT_CLINIC_OR_DEPARTMENT_OTHER): Payer: Medicare Other

## 2022-01-05 ENCOUNTER — Other Ambulatory Visit: Payer: Self-pay

## 2022-01-05 ENCOUNTER — Emergency Department (HOSPITAL_COMMUNITY): Payer: Medicare Other

## 2022-01-05 ENCOUNTER — Encounter (HOSPITAL_COMMUNITY): Payer: Self-pay | Admitting: Emergency Medicine

## 2022-01-05 ENCOUNTER — Emergency Department (HOSPITAL_COMMUNITY)
Admission: EM | Admit: 2022-01-05 | Discharge: 2022-01-05 | Disposition: A | Discharge status: Home / Self Care | Payer: Medicare Other | Attending: Emergency Medicine | Admitting: Emergency Medicine

## 2022-01-05 DIAGNOSIS — I2510 Atherosclerotic heart disease of native coronary artery without angina pectoris: Secondary | ICD-10-CM | POA: Insufficient documentation

## 2022-01-05 DIAGNOSIS — R519 Headache, unspecified: Secondary | ICD-10-CM | POA: Insufficient documentation

## 2022-01-05 DIAGNOSIS — R1030 Lower abdominal pain, unspecified: Secondary | ICD-10-CM | POA: Insufficient documentation

## 2022-01-05 DIAGNOSIS — I4891 Unspecified atrial fibrillation: Secondary | ICD-10-CM | POA: Diagnosis not present

## 2022-01-05 DIAGNOSIS — R790 Abnormal level of blood mineral: Secondary | ICD-10-CM | POA: Insufficient documentation

## 2022-01-05 DIAGNOSIS — I130 Hypertensive heart and chronic kidney disease with heart failure and stage 1 through stage 4 chronic kidney disease, or unspecified chronic kidney disease: Secondary | ICD-10-CM | POA: Insufficient documentation

## 2022-01-05 DIAGNOSIS — J189 Pneumonia, unspecified organism: Secondary | ICD-10-CM

## 2022-01-05 DIAGNOSIS — R079 Chest pain, unspecified: Secondary | ICD-10-CM

## 2022-01-05 DIAGNOSIS — Z79899 Other long term (current) drug therapy: Secondary | ICD-10-CM | POA: Insufficient documentation

## 2022-01-05 DIAGNOSIS — I509 Heart failure, unspecified: Secondary | ICD-10-CM | POA: Diagnosis not present

## 2022-01-05 DIAGNOSIS — M79604 Pain in right leg: Secondary | ICD-10-CM | POA: Insufficient documentation

## 2022-01-05 DIAGNOSIS — R778 Other specified abnormalities of plasma proteins: Secondary | ICD-10-CM | POA: Diagnosis not present

## 2022-01-05 DIAGNOSIS — N189 Chronic kidney disease, unspecified: Secondary | ICD-10-CM | POA: Diagnosis not present

## 2022-01-05 DIAGNOSIS — R609 Edema, unspecified: Secondary | ICD-10-CM | POA: Diagnosis not present

## 2022-01-05 DIAGNOSIS — R531 Weakness: Secondary | ICD-10-CM

## 2022-01-05 DIAGNOSIS — Z8673 Personal history of transient ischemic attack (TIA), and cerebral infarction without residual deficits: Secondary | ICD-10-CM | POA: Diagnosis not present

## 2022-01-05 DIAGNOSIS — M79651 Pain in right thigh: Secondary | ICD-10-CM | POA: Diagnosis not present

## 2022-01-05 DIAGNOSIS — Z7901 Long term (current) use of anticoagulants: Secondary | ICD-10-CM | POA: Insufficient documentation

## 2022-01-05 LAB — TROPONIN I (HIGH SENSITIVITY)
Troponin I (High Sensitivity): 22 ng/L — ABNORMAL HIGH (ref <18)
Troponin I (High Sensitivity): 24 ng/L — ABNORMAL HIGH (ref <18)

## 2022-01-05 LAB — BASIC METABOLIC PANEL
Anion gap: 10 (ref 5–15)
BUN: 28 mg/dL — ABNORMAL HIGH (ref 8–23)
CO2: 23 mmol/L (ref 22–32)
Calcium: 8.4 mg/dL — ABNORMAL LOW (ref 8.9–10.3)
Chloride: 107 mmol/L (ref 98–111)
Creatinine, Ser: 1.73 mg/dL — ABNORMAL HIGH (ref 0.61–1.24)
GFR, Estimated: 40 mL/min — ABNORMAL LOW (ref >60)
Glucose, Bld: 129 mg/dL — ABNORMAL HIGH (ref 70–99)
Potassium: 3.5 mmol/L (ref 3.5–5.1)
Sodium: 140 mmol/L (ref 135–145)

## 2022-01-05 LAB — URINALYSIS, ROUTINE W REFLEX MICROSCOPIC
Bilirubin Urine: NEGATIVE
Glucose, UA: NEGATIVE mg/dL
Ketones, ur: NEGATIVE mg/dL
Leukocytes,Ua: NEGATIVE
Nitrite: NEGATIVE
Protein, ur: NEGATIVE mg/dL
RBC / HPF: >50 RBC/hpf — ABNORMAL HIGH (ref 0–5)
Specific Gravity, Urine: 1.011 (ref 1.005–1.030)
pH: 5 (ref 5.0–8.0)

## 2022-01-05 LAB — TSH: TSH: 1.902 u[IU]/mL (ref 0.350–4.500)

## 2022-01-05 LAB — HEPATIC FUNCTION PANEL
ALT: 16 U/L (ref 0–44)
AST: 19 U/L (ref 15–41)
Albumin: 3.4 g/dL — ABNORMAL LOW (ref 3.5–5.0)
Alkaline Phosphatase: 95 U/L (ref 38–126)
Bilirubin, Direct: 0.2 mg/dL (ref 0.0–0.2)
Indirect Bilirubin: 0.3 mg/dL (ref 0.3–0.9)
Total Bilirubin: 0.5 mg/dL (ref 0.3–1.2)
Total Protein: 7.1 g/dL (ref 6.5–8.1)

## 2022-01-05 LAB — CBG MONITORING, ED: Glucose-Capillary: 120 mg/dL — ABNORMAL HIGH (ref 70–99)

## 2022-01-05 LAB — CBC
HCT: 44.6 % (ref 39.0–52.0)
Hemoglobin: 14.9 g/dL (ref 13.0–17.0)
MCH: 31.8 pg (ref 26.0–34.0)
MCHC: 33.4 g/dL (ref 30.0–36.0)
MCV: 95.3 fL (ref 80.0–100.0)
Platelets: 214 10*3/uL (ref 150–400)
RBC: 4.68 MIL/uL (ref 4.22–5.81)
RDW: 13.5 % (ref 11.5–15.5)
WBC: 7.6 10*3/uL (ref 4.0–10.5)
nRBC: 0 % (ref 0.0–0.2)

## 2022-01-05 LAB — BRAIN NATRIURETIC PEPTIDE: B Natriuretic Peptide: 155.8 pg/mL — ABNORMAL HIGH (ref 0.0–100.0)

## 2022-01-05 LAB — LACTIC ACID, PLASMA: Lactic Acid, Venous: 1.2 mmol/L (ref 0.5–1.9)

## 2022-01-05 LAB — CK: Total CK: 83 U/L (ref 49–397)

## 2022-01-05 MED ORDER — SODIUM CHLORIDE 0.9 % IV BOLUS
500.0000 mL | Freq: Once | INTRAVENOUS | Status: AC
  Administered 2022-01-05: 500 mL via INTRAVENOUS

## 2022-01-05 MED ORDER — NYSTATIN 100000 UNIT/GM EX POWD
Freq: Once | CUTANEOUS | Status: AC
  Filled 2022-01-05: qty 15

## 2022-01-05 MED ORDER — AMOXICILLIN-POT CLAVULANATE 875-125 MG PO TABS
1.0000 | ORAL_TABLET | Freq: Once | ORAL | Status: AC
  Administered 2022-01-05: 1 via ORAL
  Filled 2022-01-05: qty 1

## 2022-01-05 MED ORDER — AMOXICILLIN-POT CLAVULANATE 875-125 MG PO TABS: 1.0000 | ORAL_TABLET | Freq: Two times a day (BID) | ORAL | 0 refills | Status: AC

## 2022-01-05 MED ORDER — AZITHROMYCIN 250 MG PO TABS
500.0000 mg | ORAL_TABLET | Freq: Once | ORAL | Status: AC
  Administered 2022-01-05: 500 mg via ORAL
  Filled 2022-01-05: qty 2

## 2022-01-05 MED ORDER — AZITHROMYCIN 250 MG PO TABS: 250.0000 mg | ORAL_TABLET | Freq: Every day | ORAL | 0 refills | Status: AC

## 2022-01-05 MED ORDER — ACETAMINOPHEN 325 MG PO TABS
650.0000 mg | ORAL_TABLET | Freq: Once | ORAL | Status: AC
  Administered 2022-01-05: 650 mg via ORAL
  Filled 2022-01-05: qty 2

## 2022-01-05 NOTE — Discharge Instructions (Addendum)
You were seen in the ED today for generalized weakness and chest pain episode you had yesterday. Your labs to day were stable and reassuring. You did have an area seen on your chest x ray that could indicate a developing pneumonia. I have started you on antibiotics. You received your first dose in the Emergency Department and should start taking them tomorrow morning as prescribed. If you develop worsening breathing symptoms please return to the emergency room  We also discontinued your foley catheter today.  If you develop inability to urinate, you would return to the emergency department. You should schedule a follow up appointment with your PCP .

## 2022-01-05 NOTE — ED Notes (Signed)
Patient able to urinate after catheter removal

## 2022-01-05 NOTE — ED Provider Notes (Signed)
[facility] EMERGENCY DEPARTMENT Provider Note   CSN: [id number] Arrival date & time: 01/05/22  7902     History  Chief Complaint  Patient presents with   Weakness    Jorge [name] is a 76 y.o. male.  The history is provided by the patient and medical records. No language interpreter was used.  Weakness    76 year old male significant history of prior stroke, atrial fibrillation on Eliquis, peripheral vascular disease, hypertension, CHF brought here via EMS for evaluation of weakness.  Patient report he had a Foley catheter in place several weeks ago for urinary retention.  States since then he has have not been feeling well.  He endorsed progressive worsening weakness.  He normally use a wheelchair and can sometimes able to stand up to pivot and walk a few steps but now complaining of increasing dependency on his wheelchair.  Yesterday he was putting away some cans of food when he felt progressive weakness and having to lay across his bed to rest.  He woke up the next day not recalling what happened from the moment he lays down from the day before.  He endorsed worsening weakness, pain to his right leg, and pain across his lower abdomen.  He mention he has been seen several times for blood in his Foley and have been off his Eliquis for the past few days.  He does not endorse any significant headache no fever no nausea vomiting no chest pain or shortness of breath.  Home Medications Prior to Admission medications   Medication Sig Start Date End Date Taking? Authorizing Provider  acetaminophen (TYLENOL) 650 MG CR tablet Take 1,300 mg by mouth every 8 (eight) hours as needed for pain.    [provider]  amiodarone (PACERONE) 200 MG tablet Take 1 tablet (200 mg total) by mouth daily. 08/05/21   Wendall Stade, MD  apixaban (ELIQUIS) 5 MG TABS tablet Take 1 tablet (5 mg total) by mouth 2 (two) times daily. 04/26/20   Rai, Delene Ruffini, MD  atorvastatin (LIPITOR) 40  MG tablet Take 40 mg by mouth at bedtime. 04/29/19   [provider]  DULoxetine (CYMBALTA) 60 MG capsule Take 60 mg by mouth daily. 12/11/21   [provider]  furosemide (LASIX) 20 MG tablet Take 20 mg by mouth 2 (two) times daily. 03/26/20   [provider]  levothyroxine (SYNTHROID) 175 MCG tablet Take 175 mcg by mouth daily. 12/12/21   [provider]  metoprolol tartrate (LOPRESSOR) 25 MG tablet Take 25 mg by mouth 2 (two) times daily. 04/09/19   [provider]  Multiple Vitamin (MULTIVITAMIN) capsule Take 1 capsule by mouth daily.    [provider]  nitrofurantoin, macrocrystal-monohydrate, (MACROBID) 100 MG capsule Take 1 capsule (100 mg total) by mouth 2 (two) times daily. 12/26/21   Pricilla Loveless, MD  nitroGLYCERIN (NITROSTAT) 0.4 MG SL tablet Place 1 tablet (0.4 mg total) under the tongue every 5 (five) minutes as needed for chest pain. 12/05/17   Kathlen Mody, MD  omeprazole (PRILOSEC) 40 MG capsule Take 40 mg by mouth at bedtime.    [provider]  oxybutynin (DITROPAN-XL) 10 MG 24 hr tablet Take 10 mg by mouth daily. 11/11/21   [provider]  phenylephrine (NEO-SYNEPHRINE) 0.25 % nasal spray Place 1 spray into both nostrils every 6 (six) hours as needed for congestion.    [provider]  polyethylene glycol powder (GLYCOLAX/MIRALAX) powder Take 17 g by mouth daily.  Patient taking differently: Take 17 g by mouth daily as needed (constipation). 03/01/17   Veryl Speak, MD  pregabalin (LYRICA) 150 MG capsule Take 150 mg by mouth 2 (two) times daily.    [provider]      Allergies    Patient has no known allergies.    Review of Systems   Review of Systems  Neurological:  Positive for weakness.  All other systems reviewed and are negative.   Physical Exam Updated Vital Signs BP 100/63   Pulse 66   Temp 97.7 F (36.5 C) (Oral)   Resp (!) 24   SpO2 97%  Physical Exam Vitals and nursing  note reviewed.  Constitutional:      General: He is not in acute distress.    Appearance: He is well-developed.  HENT:     Head: Atraumatic.  Eyes:     Conjunctiva/sclera: Conjunctivae normal.  Cardiovascular:     Rate and Rhythm: Rhythm irregular.     Pulses: Normal pulses.     Heart sounds: Normal heart sounds.  Pulmonary:     Effort: Pulmonary effort is normal.     Breath sounds: Normal breath sounds.  Abdominal:     Palpations: Abdomen is soft.     Tenderness: There is abdominal tenderness (Mild suprapubic tenderness).  Musculoskeletal:        General: Tenderness (Tenderness to right thigh without any significant edema or deformity) present.     Cervical back: Neck supple.  Skin:    Findings: No rash.  Neurological:     Mental Status: He is alert. Mental status is at baseline.     GCS: GCS eye subscore is 4. GCS verbal subscore is 5. GCS motor subscore is 6.     ED Results / Procedures / Treatments   Labs (all labs ordered are listed, but only abnormal results are displayed) Labs Reviewed  BASIC METABOLIC PANEL - Abnormal; Notable for the following components:      Result Value   Glucose, Bld 129 (*)    BUN 28 (*)    Creatinine, Ser 1.73 (*)    Calcium 8.4 (*)    GFR, Estimated 40 (*)    All other components within normal limits  URINALYSIS, ROUTINE W REFLEX MICROSCOPIC - Abnormal; Notable for the following components:   Hgb urine dipstick LARGE (*)    RBC / HPF >50 (*)    Bacteria, UA RARE (*)    All other components within normal limits  BRAIN NATRIURETIC PEPTIDE - Abnormal; Notable for the following components:   B Natriuretic Peptide 155.8 (*)    All other components within normal limits  HEPATIC FUNCTION PANEL - Abnormal; Notable for the following components:   Albumin 3.4 (*)    All other components within normal limits  CBG MONITORING, ED - Abnormal; Notable for the following components:   Glucose-Capillary 120 (*)    All other components within normal  limits  TROPONIN I (HIGH SENSITIVITY) - Abnormal; Notable for the following components:   Troponin I (High Sensitivity) 22 (*)    All other components within normal limits  CBC  LACTIC ACID, PLASMA  CK  TSH  LACTIC ACID, PLASMA  CBG MONITORING, ED  TROPONIN I (HIGH SENSITIVITY)    EKG EKG Interpretation  Date/Time:  Sunday January 05 2022 06:51:54 EDT Ventricular Rate:  64 PR Interval:  218 QRS Duration: 142 QT Interval:  466 QTC Calculation: 480 R Axis:   -50 Text Interpretation: Sinus rhythm with  1st degree A-V block Left bundle branch block Abnormal ECG Confirmed by Gloris Manchesterixon, Ryan [phone]) on 01/05/2022 12:30:48 PM  Radiology VAS US LOWER EXTREMITY VENOUS (DVT) (7a-7p)  Result Date: 01/05/2022  Lower Venous DVT Study Patient [facility]  [name]  Date of Exam:   01/05/2022 Medical Rec #: [id number]         Accession #:    [id number] Date of Birth: 25-Jan-1946         Patient Gender: M Patient Age:   [age] years [facility] Procedure:      VAS US LOWER EXTREMITY VENOUS (DVT) Referring Phys: Fayrene HelperBOWIE Coda Mathey --------------------------------------------------------------------------------  Indications: Edema.  Comparison Study: no prior Performing Technologist: [place] PonderMegan Stricklin RVS  Examination Guidelines: A complete evaluation includes B-mode imaging, spectral Doppler, color Doppler, and power Doppler as needed of all accessible portions of each vessel. Bilateral testing is considered an integral part of a complete examination. Limited examinations for reoccurring indications may be performed as noted. The reflux portion of the exam is performed with the patient in reverse Trendelenburg.  +---------+---------------+---------+-----------+----------+--------------+ RIGHT    CompressibilityPhasicitySpontaneityPropertiesThrombus Aging +---------+---------------+---------+-----------+----------+--------------+ CFV      Full           Yes      Yes                                  +---------+---------------+---------+-----------+----------+--------------+ SFJ      Full                                                        +---------+---------------+---------+-----------+----------+--------------+ FV Prox  Full                                                        +---------+---------------+---------+-----------+----------+--------------+ FV Mid   Full                                                        +---------+---------------+---------+-----------+----------+--------------+ FV DistalFull                                                        +---------+---------------+---------+-----------+----------+--------------+ PFV      Full                                                        +---------+---------------+---------+-----------+----------+--------------+ POP      Full           Yes      Yes                                 +---------+---------------+---------+-----------+----------+--------------+  PTV      Full                                                        +---------+---------------+---------+-----------+----------+--------------+ PERO     Full                                                        +---------+---------------+---------+-----------+----------+--------------+   +----+---------------+---------+-----------+----------+--------------+ LEFTCompressibilityPhasicitySpontaneityPropertiesThrombus Aging +----+---------------+---------+-----------+----------+--------------+ CFV Full           Yes      Yes                                 +----+---------------+---------+-----------+----------+--------------+     Summary: RIGHT: - There is no evidence of deep vein thrombosis in the lower extremity.  - No cystic structure found in the popliteal fossa.  LEFT: - No evidence of common femoral vein obstruction.  *See table(s) above for measurements and observations. Electronically signed by Waverly Ferrari MD on 01/05/2022 at 1:59:24 PM.    Final     Procedures Procedures    Medications Ordered in ED Medications  acetaminophen (TYLENOL) tablet 650 mg (650 mg Oral Given 01/05/22 1248)  sodium chloride 0.9 % bolus 500 mL (0 mLs Intravenous Stopped 01/05/22 1326)  nystatin (MYCOSTATIN/NYSTOP) topical powder ( Topical Given 01/05/22 1358)    ED Course/ Medical Decision Making/ A&P Clinical Course as of 01/05/22 1520  Sun Jan 05, 2022  1514 Hx of stroke, now has new weakness and amnesia event. Getting brain MRI. Needs to be able to ambulate.  [GL]    Clinical Course User Index [GL] Victorino Dike Finis Bud, PA-C                           Medical Decision Making Amount and/or Complexity of Data Reviewed Labs: ordered. Radiology: ordered.  Risk OTC drugs. Prescription drug management.   BP 100/63   Pulse 66   Temp 97.7 F (36.5 C) (Oral)   Resp (!) 24   SpO2 97%   This is a 76 year old male with multiple comorbidity which includes aortic stenosis status post TAVR, TIA, prior stroke currently on Eliquis, atrial fibrillation, CHF, CAD, thyroid disease, hypertension, depression who presents with complaints of progressive worsening weakness.  Patient report he had a Foley catheter placed several weeks ago and since then he have not been feeling well.  He usually use a walker and wheelchair for transportation but is able to use his wheelchair more than usual.  He reports putting away cans of food yesterday and felt very weak and went to lay down in his bed.  States that he is unable to recall a block of time since laying down in his bed yesterday until today.  He also endorsed having pain to his right thigh, lower abdominal pain, headache, and overall not feeling well.  He has been noticing blood in his Foley catheter and has been seen and evaluated in the ED several times for it.  He has his Eliquis hold in the meantime.  He is frustrated that  his Foley is still in place.  On exam this is a  chronically ill-appearing male appears to be in no acute discomfort. head is normal cephalic atraumatic.  Heart significant for irregularly irregular heart rhythm.  Lungs otherwise clear.  Abdomen with tenderness to his lower pannus and suprapubic region.  Macerated skin changes noted to his right groin.  Foley catheter is in place, hay colored urine in Foley bag.  Mild suprapubic tenderness noted.  Patient is globally weak but without focal weakness.  Given his multiple comorbidities, work-up initiated, will consider brain MRI to rule out stroke as patient has been off his anticoagulant for the past several days.  He endorsed right leg pain, will obtain DVT study.  He denies any injury and no obvious signs of injury noted.  2:30 PM Labs, EKG, and imaging obtained independently viewed and interpreted by me and I agree with radiologist interpretation.  Initial troponin is mildly elevated at 22.  Patient has similar troponin lab values in the past.  No active chest pain low suspicion for ACS.  EKG without acute changes.  BNP elevated at 155.  Normal lactic acid, normal hepatic function panel, normal CK, normal TSH, creatinine is 1.73 similar to prior, normal WBC, normal H&H.  Urinalysis shows large hemoglobin on urine dipsticks but no evidence of UTI.  Venous Doppler ultrasound of right lower extremity without evidence of DVT.  brain MRI is currently pending.  3:20 PM Pt sign out to oncoming team who agrees to f/u on brain MRI and reassess.  Pt may benefit from admission if he unable to ambulate or having abnormal brain MRI  This patient presents to the ED for concern of weakness, this involves an extensive number of treatment options, and is a complaint that carries with it a high risk of complications and morbidity.  The differential diagnosis includes UTI, electrolytes abnormality, stroke, acs, metabolic derangement, infection  Co morbidities that complicate the patient  evaluation chf  Afib  Ckd  Cad  stroke Additional history obtained:  Additional history obtained from EMS External records from outside source obtained and reviewed including EMR  Lab Tests:  I Ordered, and personally interpreted labs.  The pertinent results include:  as above  Imaging Studies ordered:  I ordered imaging studies including venous doppler study I independently visualized and interpreted imaging which showed no DVT I agree with the radiologist interpretation  Cardiac Monitoring:  The patient was maintained on a cardiac monitor.  I personally viewed and interpreted the cardiac monitored which showed an underlying rhythm of: NSR  Medicines ordered and prescription drug management:  I ordered medication including IVF  for weakness Reevaluation of the patient after these medicines showed that the patient stayed the same I have reviewed the patients home medicines and have made adjustments as needed  Test Considered: brain MRI  Critical Interventions:    Problem List / ED Course: weakness  Reevaluation:  After the interventions noted above, I reevaluated the patient and found that they have :stayed the same  Social Determinants of Health: tobacco use  Dispostion:  After consideration of the diagnostic results and the patients response to treatment, I feel that the patent would benefit from pending work up.         Final Clinical Impression(s) / ED Diagnoses Final diagnoses:  None    Rx / DC Orders ED Discharge Orders     None         Fayrene Helper, PA-C 01/05/22 1520  Gloris Manchester, MD 01/05/22 1556

## 2022-01-05 NOTE — ED Provider Notes (Signed)
Accepted handoff at shift change from [place], [place]. Please see prior provider note for more detail.   Briefly: Patient is 76 y.o. male with a PMH of HTN, HLD, CHF, Atrial Fibrillation on Eliquis, aortic stenosis s/p TAVR, Hypothyroidism, Hx CVA, OSA, PVD, CKD, GERD, Obesity   Essentially he has been having worsening bilateral LE weakness in his legs for about a month ago. He noticed this when he had his foley placed and feels this is likely contributing. He stated that yesterday morning he was reaching up to grab something when he had a 10 second episode of chest pressure. He went to lay down in the bed and fell asleep. He woke up around 5:30 this morning and had a headache. He almost felt like he had an out of body experience so he called 911. He had no focal neurological deficits, speech changes, or any confusion at this time.   Workup thus far reveals Initial troponin is mildly elevated at 22.  Patient has similar troponin lab values in the past.  No active chest pain low suspicion for ACS.  EKG without acute changes.  BNP elevated at 155.  Normal lactic acid, normal hepatic function panel, normal CK, normal TSH, creatinine is 1.73 similar to prior, normal WBC, normal H&H.  Urinalysis shows large hemoglobin on urine dipsticks but no evidence of UTI.  Venous Doppler ultrasound of right lower extremity without evidence of DVT.  brain MRI is currently pending.  Interventions: given 500 cc IVF  DDX: concern for worsening generalized weakness, unclear cause.  Plan: Pending MRI, consider admission if unable to ambulate or abnormal MRI    Physical Exam  BP (!) 124/55   Pulse 64   Temp 97.7 F (36.5 C)   Resp 18   SpO2 95%   Physical Exam Vitals and nursing note reviewed.  Constitutional:      General: He is not in acute distress.    Appearance: Normal appearance. He is not ill-appearing, toxic-appearing or diaphoretic.  HENT:     Head: Normocephalic and atraumatic.     Nose: No nasal  deformity.     Mouth/Throat:     Lips: Pink. No lesions.     Mouth: Mucous membranes are moist. No injury, lacerations, oral lesions or angioedema.     Pharynx: Oropharynx is clear. Uvula midline. No pharyngeal swelling, oropharyngeal exudate, posterior oropharyngeal erythema or uvula swelling.  Eyes:     General: Gaze aligned appropriately. No scleral icterus.       Right eye: No discharge.        Left eye: No discharge.     Conjunctiva/sclera: Conjunctivae normal.     Right eye: Right conjunctiva is not injected. No exudate or hemorrhage.    Left eye: Left conjunctiva is not injected. No exudate or hemorrhage. Cardiovascular:     Rate and Rhythm: Normal rate and regular rhythm.     Pulses: Normal pulses.          Radial pulses are 2+ on the right side and 2+ on the left side.       Dorsalis pedis pulses are 2+ on the right side and 2+ on the left side.     Heart sounds: Normal heart sounds, S1 normal and S2 normal. Heart sounds not distant. No murmur heard.    No friction rub. No gallop. No S3 or S4 sounds.  Pulmonary:     Effort: Pulmonary effort is normal. No accessory muscle usage or respiratory distress.  Breath sounds: Normal breath sounds. No stridor. No wheezing, rhonchi or rales.  Chest:     Chest wall: No tenderness.  Abdominal:     General: Abdomen is flat. There is no distension.     Palpations: Abdomen is soft. There is no mass or pulsatile mass.     Tenderness: There is no abdominal tenderness. There is no guarding or rebound.  Musculoskeletal:     Right lower leg: No edema.     Left lower leg: No edema.  Skin:    General: Skin is warm and dry.     Coloration: Skin is not jaundiced or pale.     Findings: No bruising, erythema, lesion or rash.  Neurological:     General: No focal deficit present.     Mental Status: He is alert and oriented to person, place, and time.     GCS: GCS eye subscore is 4. GCS verbal subscore is 5. GCS motor subscore is 6.   Psychiatric:        Mood and Affect: Mood normal.        Behavior: Behavior normal. Behavior is cooperative.     Procedures  Procedures  ED Course / MDM   Clinical Course as of 01/05/22 1914  Nancy Fetter Jan 05, 2022  1843 Workup Is without leukocytosis, anemia, severe electrolyte abnormality.  He is noted to have a creatinine of 1.73 which is his baseline.  He has no lactic acidosis.  BNP is 155 and nonspecific.  CK 83 and normal.  Troponin is 22 with repeat of 24.  This is flat.  TSH is normal.  Hepatic function panel with no LFT abnormalities.  Urinalysis has no bacteria in it and is not concerning for urinary tract infection.  He had a right lower extremity DVT study which was negative for blood clot.  He also had MRI brain which showed stable findings and no acute stroke.  His chest x-ray showed possible left lower lobe infiltrate versus edema.  He does not seem fluid overloaded on exam, so I favor this being an infiltrate and would possibly explain his weakness.  His EKG is nonischemic and stable from baseline. [GL]  0175 Discussed findings with the patient.  He has been able to ambulate as much as he normally does and does not want to be admitted at this time.  I am prescribing him oral antibiotics to treat possible pneumonia infection.  We also discontinued his Foley catheter that he has had them for 3 weeks.  He has been able to urinate following this and has no symptoms of urinary retention.  I have asked for him to follow-up with his PCP in the next week for reevaluation.  He is given strict return precautions.  He is stable for discharge at this time. [GL]    Clinical Course User Index [GL] Sherre Poot Adora Fridge, PA-C   Medical Decision Making Amount and/or Complexity of Data Reviewed Labs: ordered. Radiology: ordered.  Risk OTC drugs. Prescription drug management.          Sheila Oats 01/05/22 1914    Drenda Freeze, MD 01/05/22 2258

## 2022-01-05 NOTE — Progress Notes (Signed)
Lower extremity venous has been completed.   Preliminary results in CV Proc.   Jinny Blossom Dorothy Polhemus 01/05/2022 1:35 PM

## 2022-01-05 NOTE — ED Triage Notes (Signed)
Pt brought to ED by [facility] with c/o weakness since yesterday morning. Pt states that he felt off yesterday after putting away groceries and went to bed and has been in bed since but does not remember much about being in bed. Pt also felt significant weakness this am and was unable to ambulate. Normally uses walker to get to wheelchair.    EMS Vitals BP 138/70 HR 60 SPO2 97% RA CBG 113

## 2022-01-22 ENCOUNTER — Telehealth (HOSPITAL_COMMUNITY): Payer: Self-pay | Admitting: Cardiovascular Disease

## 2022-01-22 ENCOUNTER — Other Ambulatory Visit (HOSPITAL_COMMUNITY): Payer: Medicare Other

## 2022-01-22 NOTE — Telephone Encounter (Signed)
Patient NO SHOWED Echo for 01/22/22.  01/22/22 Called and spoke with patient and he is unable to walk right now and in alot of pain and will callus back when he is better to reschedule/LBW 3:39  Order will be removed from the echo WQ. When patient calls back we will reinstate the order. Thank you

## 2022-02-26 ENCOUNTER — Encounter (HOSPITAL_COMMUNITY): Payer: Self-pay

## 2022-02-26 ENCOUNTER — Emergency Department (HOSPITAL_COMMUNITY): Payer: Medicare Other

## 2022-02-26 ENCOUNTER — Other Ambulatory Visit: Payer: Self-pay

## 2022-02-26 ENCOUNTER — Emergency Department (HOSPITAL_COMMUNITY)
Admission: EM | Admit: 2022-02-26 | Discharge: 2022-02-26 | Disposition: A | Discharge status: Home / Self Care | Payer: Medicare Other | Attending: Emergency Medicine | Admitting: Emergency Medicine

## 2022-02-26 DIAGNOSIS — Z7901 Long term (current) use of anticoagulants: Secondary | ICD-10-CM | POA: Diagnosis not present

## 2022-02-26 DIAGNOSIS — M545 Low back pain, unspecified: Secondary | ICD-10-CM | POA: Insufficient documentation

## 2022-02-26 DIAGNOSIS — I739 Peripheral vascular disease, unspecified: Secondary | ICD-10-CM

## 2022-02-26 DIAGNOSIS — I130 Hypertensive heart and chronic kidney disease with heart failure and stage 1 through stage 4 chronic kidney disease, or unspecified chronic kidney disease: Secondary | ICD-10-CM | POA: Diagnosis not present

## 2022-02-26 DIAGNOSIS — R410 Disorientation, unspecified: Secondary | ICD-10-CM | POA: Insufficient documentation

## 2022-02-26 DIAGNOSIS — Z20822 Contact with and (suspected) exposure to covid-19: Secondary | ICD-10-CM | POA: Insufficient documentation

## 2022-02-26 DIAGNOSIS — R531 Weakness: Secondary | ICD-10-CM | POA: Insufficient documentation

## 2022-02-26 DIAGNOSIS — M5440 Lumbago with sciatica, unspecified side: Secondary | ICD-10-CM

## 2022-02-26 DIAGNOSIS — R000 Tachycardia, unspecified: Secondary | ICD-10-CM | POA: Diagnosis not present

## 2022-02-26 DIAGNOSIS — Z79899 Other long term (current) drug therapy: Secondary | ICD-10-CM | POA: Insufficient documentation

## 2022-02-26 DIAGNOSIS — R109 Unspecified abdominal pain: Secondary | ICD-10-CM | POA: Diagnosis not present

## 2022-02-26 DIAGNOSIS — I509 Heart failure, unspecified: Secondary | ICD-10-CM | POA: Diagnosis not present

## 2022-02-26 DIAGNOSIS — N189 Chronic kidney disease, unspecified: Secondary | ICD-10-CM | POA: Diagnosis not present

## 2022-02-26 DIAGNOSIS — R4182 Altered mental status, unspecified: Secondary | ICD-10-CM | POA: Diagnosis present

## 2022-02-26 LAB — URINALYSIS, ROUTINE W REFLEX MICROSCOPIC
Bacteria, UA: NONE SEEN
Bilirubin Urine: NEGATIVE
Glucose, UA: NEGATIVE mg/dL
Ketones, ur: 80 mg/dL — AB
Leukocytes,Ua: NEGATIVE
Nitrite: NEGATIVE
Protein, ur: 30 mg/dL — AB
Specific Gravity, Urine: 1.019 (ref 1.005–1.030)
pH: 5 (ref 5.0–8.0)

## 2022-02-26 LAB — COMPREHENSIVE METABOLIC PANEL
ALT: 18 U/L (ref 0–44)
AST: 21 U/L (ref 15–41)
Albumin: 3.9 g/dL (ref 3.5–5.0)
Alkaline Phosphatase: 108 U/L (ref 38–126)
Anion gap: 16 — ABNORMAL HIGH (ref 5–15)
BUN: 21 mg/dL (ref 8–23)
CO2: 19 mmol/L — ABNORMAL LOW (ref 22–32)
Calcium: 8.7 mg/dL — ABNORMAL LOW (ref 8.9–10.3)
Chloride: 106 mmol/L (ref 98–111)
Creatinine, Ser: 1.78 mg/dL — ABNORMAL HIGH (ref 0.61–1.24)
GFR, Estimated: 39 mL/min — ABNORMAL LOW (ref >60)
Glucose, Bld: 134 mg/dL — ABNORMAL HIGH (ref 70–99)
Potassium: 3.8 mmol/L (ref 3.5–5.1)
Sodium: 141 mmol/L (ref 135–145)
Total Bilirubin: 1.4 mg/dL — ABNORMAL HIGH (ref 0.3–1.2)
Total Protein: 7.7 g/dL (ref 6.5–8.1)

## 2022-02-26 LAB — CBC WITH DIFFERENTIAL/PLATELET
Abs Immature Granulocytes: 0.06 10*3/uL (ref 0.00–0.07)
Basophils Absolute: 0 10*3/uL (ref 0.0–0.1)
Basophils Relative: 1 %
Eosinophils Absolute: 0.2 10*3/uL (ref 0.0–0.5)
Eosinophils Relative: 2 %
HCT: 51.3 % (ref 39.0–52.0)
Hemoglobin: 17.3 g/dL — ABNORMAL HIGH (ref 13.0–17.0)
Immature Granulocytes: 1 %
Lymphocytes Relative: 18 %
Lymphs Abs: 1.4 10*3/uL (ref 0.7–4.0)
MCH: 31.3 pg (ref 26.0–34.0)
MCHC: 33.7 g/dL (ref 30.0–36.0)
MCV: 92.9 fL (ref 80.0–100.0)
Monocytes Absolute: 0.9 10*3/uL (ref 0.1–1.0)
Monocytes Relative: 12 %
Neutro Abs: 5.4 10*3/uL (ref 1.7–7.7)
Neutrophils Relative %: 66 %
Platelets: 226 10*3/uL (ref 150–400)
RBC: 5.52 MIL/uL (ref 4.22–5.81)
RDW: 14.1 % (ref 11.5–15.5)
WBC: 8 10*3/uL (ref 4.0–10.5)
nRBC: 0 % (ref 0.0–0.2)

## 2022-02-26 LAB — LACTIC ACID, PLASMA: Lactic Acid, Venous: 1.9 mmol/L (ref 0.5–1.9)

## 2022-02-26 LAB — RESP PANEL BY RT-PCR (FLU A&B, COVID) ARPGX2
Influenza A by PCR: NEGATIVE
Influenza B by PCR: NEGATIVE
SARS Coronavirus 2 by RT PCR: NEGATIVE

## 2022-02-26 LAB — AMMONIA: Ammonia: 25 umol/L (ref 9–35)

## 2022-02-26 LAB — CBG MONITORING, ED: Glucose-Capillary: 165 mg/dL — ABNORMAL HIGH (ref 70–99)

## 2022-02-26 MED ORDER — METOPROLOL TARTRATE 25 MG PO TABS
25.0000 mg | ORAL_TABLET | Freq: Two times a day (BID) | ORAL | Status: DC
  Administered 2022-02-26: 25 mg via ORAL
  Filled 2022-02-26: qty 1

## 2022-02-26 MED ORDER — MORPHINE SULFATE (PF) 4 MG/ML IV SOLN
4.0000 mg | Freq: Once | INTRAVENOUS | Status: AC
  Administered 2022-02-26: 4 mg via INTRAVENOUS
  Filled 2022-02-26: qty 1

## 2022-02-26 MED ORDER — PREGABALIN 25 MG PO CAPS
150.0000 mg | ORAL_CAPSULE | Freq: Once | ORAL | Status: AC
  Administered 2022-02-26: 150 mg via ORAL
  Filled 2022-02-26: qty 2

## 2022-02-26 MED ORDER — ONDANSETRON HCL 4 MG/2ML IJ SOLN
4.0000 mg | Freq: Once | INTRAMUSCULAR | Status: AC
  Administered 2022-02-26: 4 mg via INTRAVENOUS
  Filled 2022-02-26: qty 2

## 2022-02-26 MED ORDER — SODIUM CHLORIDE 0.9 % IV BOLUS
500.0000 mL | Freq: Once | INTRAVENOUS | Status: AC
  Administered 2022-02-26: 500 mL via INTRAVENOUS

## 2022-02-26 MED ORDER — OXYCODONE-ACETAMINOPHEN 5-325 MG PO TABS: 1.0000 | ORAL_TABLET | Freq: Three times a day (TID) | ORAL | 0 refills | Status: DC | PRN

## 2022-02-26 NOTE — ED Provider Notes (Signed)
[facility] EMERGENCY DEPARTMENT Provider Note   CSN: [id number] Arrival date & time: 02/26/22  5945     History  Chief Complaint  Patient presents with   Altered Mental Status    Confusion     Jorge [name] is a 76 y.o. male.  Patient is a 76 year old male who presents with altered mental status.  He has a history of hypertension, hyperlipidemia, CHF, A-fib on Eliquis, aortic stenosis status post TAVR, chronic kidney disease, PVD.  He was brought in by [facility] he reports that per family, patient has been confused.  This is not normal for him.  He does not have any baseline confusion.  There was a report that he has been treated for pneumonia over the last week that was diagnosed by home health although patient does not report being on any antibiotics currently.  He reports generalized weakness.  There was some report of bloody urine although patient states he is urinating without difficulty.  On chart review, it looks like he recently had a Foley in place but does not currently.  He reports some nausea and vomiting but cannot tell me the last time he has vomited.  He does report a cough and some chest congestion.       Home Medications Prior to Admission medications   Medication Sig Start Date End Date Taking? Authorizing Provider  acetaminophen (TYLENOL) 650 MG CR tablet Take 1,300 mg by mouth every 8 (eight) hours as needed for pain.    [provider]  amiodarone (PACERONE) 200 MG tablet Take 1 tablet (200 mg total) by mouth daily. 08/05/21   Wendall Stade, MD  apixaban (ELIQUIS) 5 MG TABS tablet Take 1 tablet (5 mg total) by mouth 2 (two) times daily. 04/26/20   Rai, Delene Ruffini, MD  atorvastatin (LIPITOR) 40 MG tablet Take 40 mg by mouth at bedtime. 04/29/19   [provider]  DULoxetine (CYMBALTA) 60 MG capsule Take 60 mg by mouth daily. 12/11/21   [provider]  furosemide (LASIX) 20 MG tablet Take 20 mg by mouth 2 (two) times  daily. 03/26/20   [provider]  levothyroxine (SYNTHROID) 175 MCG tablet Take 175 mcg by mouth daily. 12/12/21   [provider]  metoprolol tartrate (LOPRESSOR) 25 MG tablet Take 25 mg by mouth 2 (two) times daily. 04/09/19   [provider]  Multiple Vitamin (MULTIVITAMIN) capsule Take 1 capsule by mouth daily.    [provider]  nitrofurantoin, macrocrystal-monohydrate, (MACROBID) 100 MG capsule Take 1 capsule (100 mg total) by mouth 2 (two) times daily. 12/26/21   Pricilla Loveless, MD  nitroGLYCERIN (NITROSTAT) 0.4 MG SL tablet Place 1 tablet (0.4 mg total) under the tongue every 5 (five) minutes as needed for chest pain. 12/05/17   Kathlen Mody, MD  omeprazole (PRILOSEC) 40 MG capsule Take 40 mg by mouth at bedtime.    [provider]  oxybutynin (DITROPAN-XL) 10 MG 24 hr tablet Take 10 mg by mouth daily. 11/11/21   [provider]  phenylephrine (NEO-SYNEPHRINE) 0.25 % nasal spray Place 1 spray into both nostrils every 6 (six) hours as needed for congestion.    [provider]  polyethylene glycol powder (GLYCOLAX/MIRALAX) powder Take 17 g by mouth daily. Patient taking differently: Take 17 g by mouth daily as needed (constipation). 03/01/17   Geoffery Lyons, MD  pregabalin (LYRICA) 150 MG capsule Take 150 mg by mouth 2 (two) times daily.    [provider]      Allergies    Patient has no known allergies.    Review of Systems   Review of Systems  Constitutional:  Positive for fatigue. Negative for chills, diaphoresis and fever.  HENT:  Negative for congestion, rhinorrhea and sneezing.   Eyes: Negative.   Respiratory:  Positive for cough. Negative for chest tightness and shortness of breath.   Cardiovascular:  Negative for chest pain and leg swelling.  Gastrointestinal:  Positive for nausea and vomiting. Negative for abdominal pain, blood in stool and diarrhea.  Genitourinary:  Positive for hematuria. Negative for  difficulty urinating, flank pain and frequency.  Musculoskeletal:  Negative for arthralgias and back pain.  Skin:  Negative for rash.  Neurological:  Positive for weakness (Generalized). Negative for dizziness, speech difficulty, numbness and headaches.  Psychiatric/Behavioral:  Positive for confusion.     Physical Exam Updated Vital Signs BP (!) 159/118   Pulse (!) 131   Temp (!) 97.1 F (36.2 C) (Rectal)   Resp 12   Ht 6' (1.829 m)   Wt (!) 156.9 kg   SpO2 90%   BMI 46.93 kg/m  Physical Exam Constitutional:      Appearance: He is well-developed. He is obese.  HENT:     Head: Normocephalic and atraumatic.  Eyes:     Pupils: Pupils are equal, round, and reactive to light.  Cardiovascular:     Rate and Rhythm: Normal rate and regular rhythm.     Heart sounds: Normal heart sounds.  Pulmonary:     Effort: Pulmonary effort is normal. No respiratory distress.     Breath sounds: Normal breath sounds. No wheezing or rales.  Chest:     Chest wall: No tenderness.  Abdominal:     General: Bowel sounds are normal.     Palpations: Abdomen is soft.     Tenderness: There is no abdominal tenderness. There is no guarding or rebound.  Musculoskeletal:        General: Normal range of motion.     Cervical back: Normal range of motion and neck supple.  Lymphadenopathy:     Cervical: No cervical adenopathy.  Skin:    General: Skin is warm and dry.     Findings: No rash.  Neurological:     General: No focal deficit present.     Mental Status: He is alert.     Comments: Oriented to person and place.  He can tell me the month after some thought about it but cannot tell me the year or day.     ED Results / Procedures / Treatments   Labs (all labs ordered are listed, but only abnormal results are displayed) Labs Reviewed  COMPREHENSIVE METABOLIC PANEL - Abnormal; Notable for the following components:      Result Value   CO2 19 (*)    Glucose, Bld 134 (*)    Creatinine, Ser 1.78 (*)     Calcium 8.7 (*)    Total Bilirubin 1.4 (*)    GFR, Estimated 39 (*)    Anion gap 16 (*)    All other components within normal limits  CBC WITH DIFFERENTIAL/PLATELET - Abnormal; Notable for the following components:   Hemoglobin 17.3 (*)    All other components within normal limits  URINALYSIS, ROUTINE W REFLEX MICROSCOPIC - Abnormal; Notable for the following components:   Hgb urine dipstick SMALL (*)    Ketones, ur 80 (*)    Protein, ur 30 (*)    All  other components within normal limits  CBG MONITORING, ED - Abnormal; Notable for the following components:   Glucose-Capillary 165 (*)    All other components within normal limits  RESP PANEL BY RT-PCR (FLU A&B, COVID) [id number]  LACTIC ACID, PLASMA  AMMONIA    EKG EKG Interpretation  Date/Time:  Wednesday February 26 2022 09:45:56 EST Ventricular Rate:  72 PR Interval:  194 QRS Duration: 139 QT Interval:  459 QTC Calculation: 503 R Axis:   -48 Text Interpretation: Sinus rhythm Left bundle branch block since last tracing no significant change Confirmed by Rolan Bucco [phone]) on 02/26/2022 10:09:28 AM  Radiology CT Head Wo Contrast  Result Date: 02/26/2022 CLINICAL DATA:  Altered mental status EXAM: CT HEAD WITHOUT CONTRAST TECHNIQUE: Contiguous axial images were obtained from the base of the skull through the vertex without intravenous contrast. RADIATION DOSE REDUCTION: This exam was performed according to the departmental dose-optimization program which includes automated exposure control, adjustment of the mA and/or kV according to patient size and/or use of iterative reconstruction technique. COMPARISON:  MR head 01/05/2022, CT head 09/05/2020 FINDINGS: Brain: There is no acute intracranial hemorrhage, extra-axial fluid collection, or acute infarct There is unchanged background parenchymal volume loss with prominence of the ventricular system. The ventricles are stable in size. Patchy hypodensity in the supratentorial white  matter is unchanged, likely reflecting sequela of chronic small-vessel ischemic change. There is no mass lesion.  There is no mass effect or midline shift. Vascular: There is calcification of the bilateral carotid siphons. Skull: Normal. Negative for fracture or focal lesion. Sinuses/Orbits: There is mild mucosal thickening in the paranasal sinuses. Bilateral lens implants are in place. The globes and orbits are otherwise unremarkable. Other: None. IMPRESSION: Stable noncontrast head CT with no acute intracranial pathology. Electronically Signed   By: Lesia Hausen M.D.   On: 02/26/2022 11:00   DG Chest 2 View  Result Date: 02/26/2022 CLINICAL DATA:  76 year old male with altered mental status, reportedly recent diagnosis of pneumonia. EXAM: CHEST - 2 VIEW COMPARISON:  Portable chest 01/05/2022 and earlier. FINDINGS: Semi upright AP and lateral views of the chest at 1023 hours. Ongoing low lung volumes. Stable cardiac size and mediastinal contours. Asymmetric increased interstitial markings in the left lung appears stable. Mild patchy bibasilar opacity appears stable and most resembles atelectasis. No convincing pneumothorax, pulmonary edema, pleural effusion or consolidation. Visualized tracheal air column is within normal limits. No acute osseous abnormality identified. Paucity of bowel gas. IMPRESSION: Low lung volumes and atelectasis not significantly changed from last month. No new cardiopulmonary abnormality identified. Electronically Signed   By: Odessa Fleming M.D.   On: 02/26/2022 10:42    Procedures Procedures    Medications Ordered in ED Medications  sodium chloride 0.9 % bolus 500 mL (has no administration in time range)  metoprolol tartrate (LOPRESSOR) tablet 25 mg (has no administration in time range)  sodium chloride 0.9 % bolus 500 mL (0 mLs Intravenous Stopped 02/26/22 1400)  pregabalin (LYRICA) capsule 150 mg (150 mg Oral Given 02/26/22 1316)    ED Course/ Medical Decision Making/  A&P Clinical Course as of 02/26/22 1526  Wed Feb 26, 2022  1522 Pending fluids, home meds, and talk to son, then dc [MK]    Clinical Course User Index [MK] Kommor, Wyn Forster, MD                           Medical Decision Making Amount and/or Complexity of Data Reviewed  Labs: ordered. Radiology: ordered.  Risk Prescription drug management.   Patient is a 76 year old who presents with confusion per history.  He is alert and oriented without focal neurologic deficits.  He is a little confused as to the month and year but otherwise says pretty alert and oriented.  His labs are nonconcerning.  His EKG does not show any ischemic changes or arrhythmias.  Head CT shows no acute abnormality.  He does not have other symptoms that be more concerning for stroke.  He does not have a fever or other symptoms that sound more concerning for infectious etiology.  His urine does not look infected.  Chest x-ray was performed which was interpreted by me and confirmed by the radiologist to show no evidence of pneumonia.  No pulmonary edema.  He is a little bit tachycardic currently.  Will try some fluids and give him his home dose of metoprolol.  I am also trying to get a hold of his son to verify the story but he has not answered the phone.  Care turned over to Dr. Audrie Lia pending reassessment.  Final Clinical Impression(s) / ED Diagnoses Final diagnoses:  Confusion    Rx / DC Orders ED Discharge Orders     None         Rolan Bucco, MD 02/26/22 1527

## 2022-02-26 NOTE — ED Notes (Signed)
Discharge instructions discussed with pt, verbalized understanding. VSS. Wheeled to waiting room in wheelchair.

## 2022-02-26 NOTE — ED Notes (Signed)
Patient transported to CT 

## 2022-02-26 NOTE — ED Triage Notes (Signed)
Arrival from home, bring by [facility], PT has reported Pneumonia for a week, and when talked with son today was confused on time. On assessment Ax4, with shirt term memory lost. Son stated "non being himself for a week, and yesterday  reported  bloody urine" however pt denied blood in urine today. Pt complaint of pain in both legs 10 out of 10. PT is seen by home health agency associated with [facility].

## 2022-02-27 NOTE — ED Provider Notes (Signed)
  Physical Exam  BP (!) 170/108   Pulse 67   Temp 98.9 F (37.2 C)   Resp 20   Ht 6' (1.829 m)   Wt (!) 156.9 kg   SpO2 98%   BMI 46.93 kg/m   Physical Exam Constitutional:      General: He is not in acute distress.    Appearance: Normal appearance.  HENT:     Head: Normocephalic and atraumatic.     Nose: No congestion or rhinorrhea.  Eyes:     General:        Right eye: No discharge.        Left eye: No discharge.     Extraocular Movements: Extraocular movements intact.     Pupils: Pupils are equal, round, and reactive to light.  Cardiovascular:     Rate and Rhythm: Normal rate and regular rhythm.     Heart sounds: No murmur heard. Pulmonary:     Effort: No respiratory distress.     Breath sounds: No wheezing or rales.  Abdominal:     General: There is no distension.     Tenderness: There is no abdominal tenderness.  Musculoskeletal:        General: Normal range of motion.     Cervical back: Normal range of motion.  Skin:    General: Skin is warm and dry.  Neurological:     General: No focal deficit present.     Mental Status: He is alert.     Procedures  Procedures  ED Course / MDM   Clinical Course as of 02/27/22 1024  Wed Feb 26, 2022  1522 Pending fluids, home meds, and talk to son, then dc [MK]    Clinical Course User Index [MK] July Nickson, Wyn Forster, MD   Medical Decision Making Amount and/or Complexity of Data Reviewed Labs: ordered. Radiology: ordered.  Risk Prescription drug management.   Patient received in handoff.  Initial presentation for intermittent confusion back pain and leg pain.  Patient with persistent tachycardia and plan was for IVF and reinitiation of his home metoprolol and reevaluation, discharge if vitals improved.  I reevaluated the patient I had a long discussion with him about his symptoms and due to his intermittent hematuria a CT stone study was performed that was reassuringly negative for nephrolithiasis.  His symptoms seem  to be more consistent with claudication and less consistent with neuropathy given his "attacks" that are worse in the middle the night and improved with swinging his legs over the bed and with severe Leg Pain after Walking Long Distances.  The Patient does have bounding pulses and I spoke with the vascular surgeon on-call [name] who is recommending outpatient ABIs.  Patient's pain improved with small doses of opioid pain medication and I did prescribe him a short course of Percocet for breakthrough pain only.  He was then discharged with outpatient follow-up.       Glendora Score, MD 02/27/22 1027

## 2022-03-04 ENCOUNTER — Telehealth: Payer: Self-pay | Admitting: Vascular Surgery

## 2022-03-04 NOTE — Telephone Encounter (Signed)
-----   Message from Cephus Shelling, MD sent at 02/26/2022  5:04 PM EST ----- Can you get patient in for evaluation of leg pain? 2-3 weeks is fine.  Known to our practice for previous TCAR.  ABI's please.  Can be PA if I am full.    Thanks,  Thayer Ohm

## 2022-04-08 DIAGNOSIS — Z8673 Personal history of transient ischemic attack (TIA), and cerebral infarction without residual deficits: Secondary | ICD-10-CM | POA: Diagnosis not present

## 2022-04-08 DIAGNOSIS — Z9181 History of falling: Secondary | ICD-10-CM | POA: Diagnosis not present

## 2022-04-08 DIAGNOSIS — I480 Paroxysmal atrial fibrillation: Secondary | ICD-10-CM | POA: Diagnosis not present

## 2022-04-08 DIAGNOSIS — I5032 Chronic diastolic (congestive) heart failure: Secondary | ICD-10-CM | POA: Diagnosis not present

## 2022-04-08 DIAGNOSIS — N183 Chronic kidney disease, stage 3 unspecified: Secondary | ICD-10-CM | POA: Diagnosis not present

## 2022-04-08 DIAGNOSIS — G4733 Obstructive sleep apnea (adult) (pediatric): Secondary | ICD-10-CM | POA: Diagnosis not present

## 2022-04-08 DIAGNOSIS — R338 Other retention of urine: Secondary | ICD-10-CM | POA: Diagnosis not present

## 2022-04-08 DIAGNOSIS — I130 Hypertensive heart and chronic kidney disease with heart failure and stage 1 through stage 4 chronic kidney disease, or unspecified chronic kidney disease: Secondary | ICD-10-CM | POA: Diagnosis not present

## 2022-04-08 DIAGNOSIS — I25119 Atherosclerotic heart disease of native coronary artery with unspecified angina pectoris: Secondary | ICD-10-CM | POA: Diagnosis not present

## 2022-04-14 DIAGNOSIS — Z9181 History of falling: Secondary | ICD-10-CM | POA: Diagnosis not present

## 2022-04-14 DIAGNOSIS — I130 Hypertensive heart and chronic kidney disease with heart failure and stage 1 through stage 4 chronic kidney disease, or unspecified chronic kidney disease: Secondary | ICD-10-CM | POA: Diagnosis not present

## 2022-04-14 DIAGNOSIS — I25119 Atherosclerotic heart disease of native coronary artery with unspecified angina pectoris: Secondary | ICD-10-CM | POA: Diagnosis not present

## 2022-04-14 DIAGNOSIS — I480 Paroxysmal atrial fibrillation: Secondary | ICD-10-CM | POA: Diagnosis not present

## 2022-04-14 DIAGNOSIS — G4733 Obstructive sleep apnea (adult) (pediatric): Secondary | ICD-10-CM | POA: Diagnosis not present

## 2022-04-14 DIAGNOSIS — N183 Chronic kidney disease, stage 3 unspecified: Secondary | ICD-10-CM | POA: Diagnosis not present

## 2022-04-14 DIAGNOSIS — Z8673 Personal history of transient ischemic attack (TIA), and cerebral infarction without residual deficits: Secondary | ICD-10-CM | POA: Diagnosis not present

## 2022-04-14 DIAGNOSIS — R338 Other retention of urine: Secondary | ICD-10-CM | POA: Diagnosis not present

## 2022-04-14 DIAGNOSIS — I5032 Chronic diastolic (congestive) heart failure: Secondary | ICD-10-CM | POA: Diagnosis not present

## 2022-04-15 DIAGNOSIS — I4891 Unspecified atrial fibrillation: Secondary | ICD-10-CM | POA: Diagnosis not present

## 2022-04-15 DIAGNOSIS — I6930 Unspecified sequelae of cerebral infarction: Secondary | ICD-10-CM | POA: Diagnosis not present

## 2022-04-15 DIAGNOSIS — I350 Nonrheumatic aortic (valve) stenosis: Secondary | ICD-10-CM | POA: Diagnosis not present

## 2022-04-15 DIAGNOSIS — I5032 Chronic diastolic (congestive) heart failure: Secondary | ICD-10-CM | POA: Diagnosis not present

## 2022-04-15 DIAGNOSIS — Z7901 Long term (current) use of anticoagulants: Secondary | ICD-10-CM | POA: Diagnosis not present

## 2022-04-15 DIAGNOSIS — I739 Peripheral vascular disease, unspecified: Secondary | ICD-10-CM | POA: Diagnosis not present

## 2022-04-15 DIAGNOSIS — Z952 Presence of prosthetic heart valve: Secondary | ICD-10-CM | POA: Diagnosis not present

## 2022-04-15 DIAGNOSIS — N1832 Chronic kidney disease, stage 3b: Secondary | ICD-10-CM | POA: Diagnosis not present

## 2022-04-17 DIAGNOSIS — I480 Paroxysmal atrial fibrillation: Secondary | ICD-10-CM | POA: Diagnosis not present

## 2022-04-17 DIAGNOSIS — G4733 Obstructive sleep apnea (adult) (pediatric): Secondary | ICD-10-CM | POA: Diagnosis not present

## 2022-04-17 DIAGNOSIS — R338 Other retention of urine: Secondary | ICD-10-CM | POA: Diagnosis not present

## 2022-04-17 DIAGNOSIS — I130 Hypertensive heart and chronic kidney disease with heart failure and stage 1 through stage 4 chronic kidney disease, or unspecified chronic kidney disease: Secondary | ICD-10-CM | POA: Diagnosis not present

## 2022-04-17 DIAGNOSIS — Z8673 Personal history of transient ischemic attack (TIA), and cerebral infarction without residual deficits: Secondary | ICD-10-CM | POA: Diagnosis not present

## 2022-04-17 DIAGNOSIS — I25119 Atherosclerotic heart disease of native coronary artery with unspecified angina pectoris: Secondary | ICD-10-CM | POA: Diagnosis not present

## 2022-04-17 DIAGNOSIS — I5032 Chronic diastolic (congestive) heart failure: Secondary | ICD-10-CM | POA: Diagnosis not present

## 2022-04-17 DIAGNOSIS — Z9181 History of falling: Secondary | ICD-10-CM | POA: Diagnosis not present

## 2022-04-17 DIAGNOSIS — N183 Chronic kidney disease, stage 3 unspecified: Secondary | ICD-10-CM | POA: Diagnosis not present

## 2022-05-02 NOTE — Telephone Encounter (Signed)
Patient declined appointment at this time. Stated he is being followed up by another office at this time.

## 2022-05-17 ENCOUNTER — Telehealth (HOSPITAL_COMMUNITY): Payer: Self-pay

## 2022-05-17 NOTE — Telephone Encounter (Signed)
Spoke with patient, who declined scheduling due to distance being driven, pateint also stated that he is being followed by [facility]/primary care for his neck. Patient requested appointment request be canceled to avoid further calls from [facility]

## 2022-07-29 ENCOUNTER — Ambulatory Visit (HOSPITAL_COMMUNITY)
Admission: RE | Admit: 2022-07-29 | Discharge: 2022-07-29 | Disposition: A | Discharge status: Home / Self Care | Payer: Medicare HMO | Source: Ambulatory Visit | Attending: Vascular Surgery | Admitting: Vascular Surgery

## 2022-07-29 ENCOUNTER — Encounter: Payer: Self-pay | Admitting: Vascular Surgery

## 2022-07-29 ENCOUNTER — Ambulatory Visit: Payer: Medicare HMO | Admitting: Vascular Surgery

## 2022-07-29 VITALS — BP 104/66 | HR 53 | Temp 97.3°F | Resp 18 | Ht 72.0 in | Wt 312.0 lb

## 2022-07-29 DIAGNOSIS — I6523 Occlusion and stenosis of bilateral carotid arteries: Secondary | ICD-10-CM

## 2022-07-29 NOTE — Progress Notes (Signed)
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  He previously had a left carotid endarterectomy and also a previous left TCAR with Dr. Darrick Penna.  He was seen in the hospital with a TIA left brain event thought to be from restenosis of his carotid stent placed in 2019 during TCAR by Dr. Darrick Penna.  Ultimately underwent a redo left TCAR by myself on 05/05/20.   We are also following a 60 to 79% right ICA stenosis.  No prior intervention on the right side.  No recent stroke or TIA symptoms.  States he spends most of his time in a wheelchair.   Past Medical History:  Diagnosis Date   Anxiety    situational -    Asthma due to seasonal allergies    Atrial fibrillation    pt on Eliquis   Chest pain    CHF (congestive heart failure)    Depression    situational   Dyspnea    when I walk a long distance   Dysrhythmia    Edema    GERD (gastroesophageal reflux disease)    ocassional   Heart murmur    HTN (hypertension)    Hyperlipidemia    Hypothyroidism    Neuropathy    Obesity    Peripheral vascular disease    carotid    Renal insufficiency 12/04/2017   Sleep apnea    SOB (shortness of breath)    Stroke    Right side weakness, using a cane    Past Surgical History:  Procedure Laterality Date   BACK SURGERY     LUmbar laminectomy   CARDIAC CATHETERIZATION     CARDIAC CATHETERIZATION N/A 04/30/2016   Procedure: Right/Left Heart Cath and Coronary Angiography;  Surgeon: Kathleene Hazel, MD;  Location: [facility] INVASIVE CV LAB;  Service: Cardiovascular;  Laterality: N/A;   CAROTID ANGIOGRAM N/A 05/30/2014   Procedure: Dorise Bullion;  Surgeon: Nada Libman, MD;  Location: [facility] CATH LAB;  Service: Cardiovascular;  Laterality: N/A;   EYE SURGERY Right    cataract   EYE  SURGERY Left    bioocular lens implanted due injury   LUMBAR FUSION     MULTIPLE EXTRACTIONS WITH ALVEOLOPLASTY N/A 05/02/2016   Procedure: Extraction of tooth #'s 7, 10, 23, 24, 25,and 26 with alveoloplasty.;  Surgeon: Charlynne Pander, DDS;  Location: [facility] OR;  Service: Oral Surgery;  Laterality: N/A;   TEE WITHOUT CARDIOVERSION N/A 05/06/2016   Procedure: TRANSESOPHAGEAL ECHOCARDIOGRAM (TEE);  Surgeon: Kathleene Hazel, MD;  Location[facility] OR;  Service: Open Heart Surgery;  Laterality: N/A;   TRANSCAROTID ARTERY REVASCULARIZATION (TCAR)  01/15/2018   TRANSCAROTID ARTERY REVASCULARIZATION LEFT    TRANSCAROTID ARTERY REVASCULARIZATION  Left 01/15/2018   Procedure: TRANSCAROTID ARTERY REVASCULARIZATION LEFT;  Surgeon: Sherren Kerns, MD;  Location: [facility] OR;  Service: Vascular;  Laterality: Left;   TRANSCAROTID ARTERY REVASCULARIZATION  Left 04/25/2020   Procedure: TRANSCAROTID ARTERY REVASCULARIZATION;  Surgeon: Cephus Shelling, MD;  Location: [facility] OR;  Service: Vascular;  Laterality: Left;   TRANSCATHETER AORTIC VALVE REPLACEMENT, TRANSFEMORAL N/A 05/06/2016   Procedure: TRANSCATHETER AORTIC VALVE REPLACEMENT, TRANSFEMORAL using a 23mm Edwards Sapien 3 Transcatheter Heart Valve;  Surgeon: Kathleene Hazel, MD;  Location: [facility] OR;  Service: Open Heart Surgery;  Laterality: N/A;  ULTRASOUND GUIDANCE FOR VASCULAR ACCESS Right 04/25/2020   Procedure: ULTRASOUND GUIDANCE FOR VASCULAR ACCESS;  Surgeon: Cephus Shelling, MD;  Location: [facility] OR;  Service: Vascular;  Laterality: Right;    Family History  Problem Relation Age of Onset   Heart attack Mother    Stroke Father     SOCIAL HISTORY: Social History   Socioeconomic History   Marital status: Divorced    Spouse name: Not on file   Number of children: 3   Years of education: Not on file   Highest education level: Not on file  Occupational History   Not on file  Tobacco Use   Smoking status: Former   Smokeless tobacco:  Never   Tobacco comments:    quit early 1980's   Vaping Use   Vaping Use: Never used  Substance and Sexual Activity   Alcohol use: No   Drug use: No   Sexual activity: Not on file  Other Topics Concern   Not on file  Social History Narrative   Not on file   Social Determinants of Health   Financial Resource Strain: Not on file  Food Insecurity: Not on file  Transportation Needs: Not on file  Physical Activity: Not on file  Stress: Not on file  Social Connections: Not on file  Intimate Partner Violence: Not on file    No Known Allergies  Current Outpatient Medications  Medication Sig Dispense Refill   acetaminophen (TYLENOL) 650 MG CR tablet Take 1,300 mg by mouth every 8 (eight) hours as needed for pain.     amiodarone (PACERONE) 200 MG tablet Take 1 tablet (200 mg total) by mouth daily. 90 tablet 3   apixaban (ELIQUIS) 5 MG TABS tablet Take 1 tablet (5 mg total) by mouth 2 (two) times daily. 60 tablet 3   atorvastatin (LIPITOR) 40 MG tablet Take 40 mg by mouth at bedtime.     DULoxetine (CYMBALTA) 60 MG capsule Take 60 mg by mouth daily.     furosemide (LASIX) 20 MG tablet Take 20 mg by mouth 2 (two) times daily.     levothyroxine (SYNTHROID) 175 MCG tablet Take 175 mcg by mouth daily.     metoprolol tartrate (LOPRESSOR) 25 MG tablet Take 25 mg by mouth 2 (two) times daily.     Multiple Vitamin (MULTIVITAMIN) capsule Take 1 capsule by mouth daily.     nitrofurantoin, macrocrystal-monohydrate, (MACROBID) 100 MG capsule Take 1 capsule (100 mg total) by mouth 2 (two) times daily. 9 capsule 0   nitroGLYCERIN (NITROSTAT) 0.4 MG SL tablet Place 1 tablet (0.4 mg total) under the tongue every 5 (five) minutes as needed for chest pain. 20 tablet 1   omeprazole (PRILOSEC) 40 MG capsule Take 40 mg by mouth at bedtime.     oxybutynin (DITROPAN-XL) 10 MG 24 hr tablet Take 10 mg by mouth daily.     oxyCODONE-acetaminophen (PERCOCET/ROXICET) 5-325 MG tablet Take 1 tablet by mouth every 8  (eight) hours as needed for severe pain (for breakthrough pain only). 15 tablet 0   phenylephrine (NEO-SYNEPHRINE) 0.25 % nasal spray Place 1 spray into both nostrils every 6 (six) hours as needed for congestion.     polyethylene glycol powder (GLYCOLAX/MIRALAX) powder Take 17 g by mouth daily. (Patient taking differently: Take 17 g by mouth daily as needed (constipation).) 255 g 0   pregabalin (LYRICA) 150 MG capsule Take 150 mg by mouth 2 (two) times daily.     No current facility-administered medications for this  visit.    REVIEW OF SYSTEMS:  [X]  denotes positive finding, [ ]  denotes negative finding Cardiac  Comments:  Chest pain or chest pressure:    Shortness of breath upon exertion:    Short of breath when lying flat:    Irregular heart rhythm:        Vascular    Pain in calf, thigh, or hip brought on by ambulation:    Pain in feet at night that wakes you up from your sleep:     Blood clot in your veins:    Leg swelling:  x       Pulmonary    Oxygen at home:    Productive cough:     Wheezing:         Neurologic    Sudden weakness in arms or legs:     Sudden numbness in arms or legs:     Sudden onset of difficulty speaking or slurred speech:    Temporary loss of vision in one eye:     Problems with dizziness:         Gastrointestinal    Blood in stool:     Vomited blood:         Genitourinary    Burning when urinating:     Blood in urine:        Psychiatric    Major depression:         Hematologic    Bleeding problems:    Problems with blood clotting too easily:        Skin    Rashes or ulcers:        Constitutional    Fever or chills:      PHYSICAL EXAM: Vitals:   07/29/22 1618 07/29/22 1623  BP: (!) 109/54 104/66  Pulse: (!) 53 (!) 53  Resp: 18   Temp: (!) 97.3 F (36.3 C)   TempSrc: Temporal   SpO2: 93%   Weight: (!) 312 lb (141.5 kg)   Height: 6' (1.829 m)     GENERAL: The patient is a well-nourished male, in no acute distress. The vital  signs are documented above. CARDIAC: There is a regular rate and rhythm.  VASCULAR:  Well healed left neck incision. NEUROLOGIC: No focal weakness or paresthesias are detected.  CN II-XII grossly intact.   DATA:   Carotid duplex today shows stable 60 to 79% right ICA stenosis and a widely patent left carotid stent.  Assessment/Plan:  77 year old male previously had a left carotid endarterectomy and also a previous left TCAR with Dr. Darrick Penna.  He was later seen in the hospital with a TIA left brain event thought to be from restenosis of his carotid stent placed in 2019.  Ultimately underwent a redo left TCAR by myself on 05/05/20.   Discussed his left carotid stent looks widely patent on duplex today.  He has a stable 60 to 79% right ICA stenosis.  Discussed in the setting of asymptomatic carotid disease reserve surgical intervention for greater than 80% stenosis.  Continue to monitor his moderate right ICA stenosis on follow-up in 6 months.  We will plan to see him again in 6 months with carotid duplex here in the office.     Cephus Shelling, MD [facility] Office: [phone]

## 2022-08-06 ENCOUNTER — Other Ambulatory Visit: Payer: Self-pay

## 2022-08-06 DIAGNOSIS — I6523 Occlusion and stenosis of bilateral carotid arteries: Secondary | ICD-10-CM

## 2022-08-11 ENCOUNTER — Other Ambulatory Visit: Payer: Self-pay

## 2022-08-11 DIAGNOSIS — I6523 Occlusion and stenosis of bilateral carotid arteries: Secondary | ICD-10-CM

## 2022-08-11 DIAGNOSIS — M79671 Pain in right foot: Secondary | ICD-10-CM

## 2022-08-12 ENCOUNTER — Encounter (INDEPENDENT_AMBULATORY_CARE_PROVIDER_SITE_OTHER): Payer: Medicare Other

## 2022-08-12 ENCOUNTER — Encounter (INDEPENDENT_AMBULATORY_CARE_PROVIDER_SITE_OTHER): Payer: Medicare Other | Admitting: Vascular Surgery

## 2023-02-03 ENCOUNTER — Ambulatory Visit (HOSPITAL_COMMUNITY)
Admission: RE | Admit: 2023-02-03 | Discharge: 2023-02-03 | Disposition: A | Discharge status: Home / Self Care | Payer: Medicare HMO | Source: Ambulatory Visit | Attending: Vascular Surgery | Admitting: Vascular Surgery

## 2023-02-03 ENCOUNTER — Encounter: Payer: Self-pay | Admitting: Vascular Surgery

## 2023-02-03 ENCOUNTER — Ambulatory Visit (INDEPENDENT_AMBULATORY_CARE_PROVIDER_SITE_OTHER)
Admission: RE | Admit: 2023-02-03 | Discharge: 2023-02-03 | Disposition: A | Discharge status: Home / Self Care | Payer: Medicare HMO | Source: Ambulatory Visit | Attending: Vascular Surgery

## 2023-02-03 ENCOUNTER — Ambulatory Visit: Payer: Medicare HMO | Admitting: Vascular Surgery

## 2023-02-03 VITALS — BP 128/88 | HR 65 | Temp 98.2°F

## 2023-02-03 DIAGNOSIS — M79671 Pain in right foot: Secondary | ICD-10-CM | POA: Insufficient documentation

## 2023-02-03 DIAGNOSIS — M79672 Pain in left foot: Secondary | ICD-10-CM

## 2023-02-03 DIAGNOSIS — I6523 Occlusion and stenosis of bilateral carotid arteries: Secondary | ICD-10-CM | POA: Diagnosis not present

## 2023-02-03 LAB — VAS US ABI WITH/WO TBI
Left ABI: 1.23
Right ABI: 1.1

## 2023-02-03 NOTE — Progress Notes (Signed)
Patient name: Jorge [name] MRN: [id number] DOB: Mar 24, 1946 Sex: male  REASON FOR CONSULT: 74-month follow-up for carotid surveillance  HPI: Jorge [name] is a 77 y.o. male, who presents for 6 month follow-up for surveillance of carotid artery disease.  He previously had a left carotid endarterectomy and also a previous left TCAR with Dr. Darrick Penna.  He was seen in the hospital with a TIA left brain event thought to be from restenosis of his carotid stent placed in 2019 during TCAR by Dr. Darrick Penna.  Ultimately underwent a redo left TCAR by myself on 05/05/20.   We are also following a 60 to 79% right ICA stenosis.  No prior intervention on the right side.  No recent stroke or TIA symptoms on follow-up today.  States he spends most of his time in a wheelchair.  States he is able to stand and walk about 60 feet with a cane.  We did check ABI since complaining of vague foot pain.   Past Medical History:  Diagnosis Date   Anxiety    situational -    Asthma due to seasonal allergies    Atrial fibrillation (HCC)    pt on Eliquis   Chest pain    CHF (congestive heart failure) (HCC)    Depression    situational   Dyspnea    when I walk a long distance   Dysrhythmia    Edema    GERD (gastroesophageal reflux disease)    ocassional   Heart murmur    HTN (hypertension)    Hyperlipidemia    Hypothyroidism    Neuropathy    Obesity    Peripheral vascular disease (HCC)    carotid    Renal insufficiency 12/04/2017   Sleep apnea    SOB (shortness of breath)    Stroke (HCC)    Right side weakness, using a cane    Past Surgical History:  Procedure Laterality Date   BACK SURGERY     LUmbar laminectomy   CARDIAC CATHETERIZATION     CARDIAC CATHETERIZATION N/A 04/30/2016   Procedure: Right/Left Heart Cath and Coronary Angiography;  Surgeon: Kathleene Hazel, MD;  Location: [facility] INVASIVE CV LAB;  Service: Cardiovascular;  Laterality: N/A;   CAROTID ANGIOGRAM N/A 05/30/2014   Procedure:  Dorise Bullion;  Surgeon: Nada Libman, MD;  Location: [facility] CATH LAB;  Service: Cardiovascular;  Laterality: N/A;   EYE SURGERY Right    cataract   EYE SURGERY Left    bioocular lens implanted due injury   LUMBAR FUSION     MULTIPLE EXTRACTIONS WITH ALVEOLOPLASTY N/A 05/02/2016   Procedure: Extraction of tooth #'s 7, 10, 23, 24, 25,and 26 with alveoloplasty.;  Surgeon: Charlynne Pander, DDS;  Location: [facility] OR;  Service: Oral Surgery;  Laterality: N/A;   TEE WITHOUT CARDIOVERSION N/A 05/06/2016   Procedure: TRANSESOPHAGEAL ECHOCARDIOGRAM (TEE);  Surgeon: Kathleene Hazel, MD;  Location: [facility] OR;  Service: Open Heart Surgery;  Laterality: N/A;   TRANSCAROTID ARTERY REVASCULARIZATION (TCAR)  01/15/2018   TRANSCAROTID ARTERY REVASCULARIZATION LEFT    TRANSCAROTID ARTERY REVASCULARIZATION  Left 01/15/2018   Procedure: TRANSCAROTID ARTERY REVASCULARIZATION LEFT;  Surgeon: Sherren Kerns, MD;  Location: [facility] OR;  Service: Vascular;  Laterality: Left;   TRANSCAROTID ARTERY REVASCULARIZATION  Left 04/25/2020   Procedure: TRANSCAROTID ARTERY REVASCULARIZATION;  Surgeon: Cephus Shelling, MD;  Location: [facility] OR;  Service: Vascular;  Laterality: Left;   TRANSCATHETER AORTIC VALVE REPLACEMENT, TRANSFEMORAL N/A 05/06/2016   Procedure: TRANSCATHETER  AORTIC VALVE REPLACEMENT, TRANSFEMORAL using a 23mm Edwards Sapien 3 Transcatheter Heart Valve;  Surgeon: Kathleene Hazel, MD;  Location: [facility] OR;  Service: Open Heart Surgery;  Laterality: N/A;   ULTRASOUND GUIDANCE FOR VASCULAR ACCESS Right 04/25/2020   Procedure: ULTRASOUND GUIDANCE FOR VASCULAR ACCESS;  Surgeon: Cephus Shelling, MD;  Location: [facility] OR;  Service: Vascular;  Laterality: Right;    Family History  Problem Relation Age of Onset   Heart attack Mother    Stroke Father     SOCIAL HISTORY: Social History   Socioeconomic History   Marital status: Divorced    Spouse name: Not on file   Number of children: 3   Years of  education: Not on file   Highest education level: Not on file  Occupational History   Not on file  Tobacco Use   Smoking status: Former   Smokeless tobacco: Never   Tobacco comments:    quit early 1980's   Vaping Use   Vaping status: Never Used  Substance and Sexual Activity   Alcohol use: No   Drug use: No   Sexual activity: Not on file  Other Topics Concern   Not on file  Social History Narrative   Not on file   Social Determinants of Health   Financial Resource Strain: Not on file  Food Insecurity: No Food Insecurity (01/26/2023)   Received from [facility]   Hunger Vital Sign    Worried About Running Out of Food in the Last Year: Never true    Ran Out of Food in the Last Year: Never true  Transportation Needs: No Transportation Needs (01/26/2023)   Received from [facility]   PRAPARE - Transportation    Lack of Transportation (Medical): No    Lack of Transportation (Non-Medical): No  Physical Activity: Not on file  Stress: Not on file  Social Connections: Not on file  Intimate Partner Violence: Not on file    No Known Allergies  Current Outpatient Medications  Medication Sig Dispense Refill   acetaminophen (TYLENOL) 650 MG CR tablet Take 1,300 mg by mouth every 8 (eight) hours as needed for pain.     amiodarone (PACERONE) 200 MG tablet Take 1 tablet (200 mg total) by mouth daily. 90 tablet 3   apixaban (ELIQUIS) 5 MG TABS tablet Take 1 tablet (5 mg total) by mouth 2 (two) times daily. 60 tablet 3   atorvastatin (LIPITOR) 40 MG tablet Take 40 mg by mouth at bedtime.     DULoxetine (CYMBALTA) 60 MG capsule Take 60 mg by mouth daily.     furosemide (LASIX) 20 MG tablet Take 20 mg by mouth 2 (two) times daily.     levothyroxine (SYNTHROID) 175 MCG tablet Take 175 mcg by mouth daily.     metoprolol tartrate (LOPRESSOR) 25 MG tablet Take 25 mg by mouth 2 (two) times daily.     Multiple Vitamin (MULTIVITAMIN) capsule Take 1 capsule by mouth daily.      nitrofurantoin, macrocrystal-monohydrate, (MACROBID) 100 MG capsule Take 1 capsule (100 mg total) by mouth 2 (two) times daily. 9 capsule 0   nitroGLYCERIN (NITROSTAT) 0.4 MG SL tablet Place 1 tablet (0.4 mg total) under the tongue every 5 (five) minutes as needed for chest pain. 20 tablet 1   omeprazole (PRILOSEC) 40 MG capsule Take 40 mg by mouth at bedtime.     oxybutynin (DITROPAN-XL) 10 MG 24 hr tablet Take 10 mg by mouth daily.     phenylephrine (  NEO-SYNEPHRINE) 0.25 % nasal spray Place 1 spray into both nostrils every 6 (six) hours as needed for congestion.     polyethylene glycol powder (GLYCOLAX/MIRALAX) powder Take 17 g by mouth daily. (Patient taking differently: Take 17 g by mouth daily as needed (constipation).) 255 g 0   pregabalin (LYRICA) 150 MG capsule Take 150 mg by mouth 2 (two) times daily.     No current facility-administered medications for this visit.    REVIEW OF SYSTEMS:  [X]  denotes positive finding, [ ]  denotes negative finding Cardiac  Comments:  Chest pain or chest pressure:    Shortness of breath upon exertion:    Short of breath when lying flat:    Irregular heart rhythm:        Vascular    Pain in calf, thigh, or hip brought on by ambulation:    Pain in feet at night that wakes you up from your sleep:     Blood clot in your veins:    Leg swelling:  x       Pulmonary    Oxygen at home:    Productive cough:     Wheezing:         Neurologic    Sudden weakness in arms or legs:     Sudden numbness in arms or legs:     Sudden onset of difficulty speaking or slurred speech:    Temporary loss of vision in one eye:     Problems with dizziness:         Gastrointestinal    Blood in stool:     Vomited blood:         Genitourinary    Burning when urinating:     Blood in urine:        Psychiatric    Major depression:         Hematologic    Bleeding problems:    Problems with blood clotting too easily:        Skin    Rashes or ulcers:         Constitutional    Fever or chills:      PHYSICAL EXAM: Vitals:   02/03/23 1404  BP: 128/88  Pulse: 65  Temp: 98.2 F (36.8 C)  TempSrc: Temporal  SpO2: 98%    GENERAL: The patient is a well-nourished male, in no acute distress. The vital signs are documented above. CARDIAC: There is a regular rate and rhythm.  VASCULAR:  Well healed left neck incision. Difficult to palpate pedal pulses, venous stasis changes NEUROLOGIC: No focal weakness or paresthesias are detected.  CN II-XII grossly intact.   DATA:   Carotid duplex today shows stable 60 to 79% right ICA stenosis and a widely patent left carotid stent.  ABIs today are 1.10 on the right multiphasic and 1.23 on the left multiphasic  Assessment/Plan:  77 year old male previously had a left carotid endarterectomy and also a previous left TCAR with Dr. Darrick Penna.  He was later seen in the hospital with a TIA left brain event thought to be from restenosis of his carotid stent placed in 2019.  Ultimately underwent a redo left TCAR by myself on 05/05/20.   Discussed his left carotid stent looks widely patent on duplex today.  He has a stable 60 to 79% right ICA stenosis.  Discussed in the setting of asymptomatic carotid disease reserve surgical intervention for greater than 80% stenosis.  Continue to monitor his moderate right ICA stenosis on follow-up in 6  months.  We will plan to see him again in 6 months with carotid duplex here in the office.    Also discussed his ABIs are normal today.  He has previously been complaining of vague leg pain.  Has findings of chronic venous insufficiency.   Cephus Shelling, MD [facility] Office: [phone]

## 2023-02-19 ENCOUNTER — Other Ambulatory Visit: Payer: Self-pay

## 2023-02-19 DIAGNOSIS — I6523 Occlusion and stenosis of bilateral carotid arteries: Secondary | ICD-10-CM

## 2023-05-04 ENCOUNTER — Other Ambulatory Visit (HOSPITAL_COMMUNITY): Payer: Self-pay

## 2023-05-07 ENCOUNTER — Other Ambulatory Visit (HOSPITAL_COMMUNITY): Payer: Self-pay

## 2023-05-07 MED ORDER — LEVOTHYROXINE SODIUM 175 MCG PO TABS
175.0000 ug | ORAL_TABLET | Freq: Every day | ORAL | 3 refills | Status: AC
  Filled 2023-07-13: qty 90, 90d supply, fill #0

## 2023-05-07 MED ORDER — OXYBUTYNIN CHLORIDE ER 10 MG PO TB24
10.0000 mg | ORAL_TABLET | Freq: Every day | ORAL | 3 refills | Status: DC
  Filled 2023-07-13: qty 90, 90d supply, fill #0

## 2023-05-07 MED ORDER — APIXABAN 5 MG PO TABS: 5.0000 mg | ORAL_TABLET | Freq: Two times a day (BID) | ORAL | 1 refills | Status: DC

## 2023-05-07 MED ORDER — METOPROLOL TARTRATE 25 MG PO TABS
25.0000 mg | ORAL_TABLET | Freq: Two times a day (BID) | ORAL | 3 refills | Status: AC
  Filled 2023-07-13: qty 180, 90d supply, fill #0

## 2023-05-07 MED ORDER — PREGABALIN 150 MG PO CAPS
150.0000 mg | ORAL_CAPSULE | Freq: Two times a day (BID) | ORAL | 1 refills | Status: DC
  Filled 2023-07-13: qty 180, 90d supply, fill #0

## 2023-05-07 MED ORDER — AMIODARONE HCL 200 MG PO TABS
200.0000 mg | ORAL_TABLET | Freq: Every day | ORAL | 3 refills | Status: DC
  Filled 2023-07-13: qty 90, 90d supply, fill #0

## 2023-05-07 MED ORDER — DULOXETINE HCL 60 MG PO CPEP
60.0000 mg | ORAL_CAPSULE | Freq: Every day | ORAL | 3 refills | Status: DC
  Filled 2023-07-13: qty 90, 90d supply, fill #0
  Filled 2023-12-02: qty 90, 90d supply, fill #1

## 2023-05-07 MED ORDER — ATORVASTATIN CALCIUM 40 MG PO TABS
40.0000 mg | ORAL_TABLET | Freq: Every day | ORAL | 3 refills | Status: AC
  Filled 2023-07-13: qty 90, 90d supply, fill #0

## 2023-05-07 MED ORDER — OMEPRAZOLE 20 MG PO CPDR
20.0000 mg | DELAYED_RELEASE_CAPSULE | Freq: Two times a day (BID) | ORAL | 3 refills | Status: DC
  Filled 2023-07-13: qty 180, 90d supply, fill #0

## 2023-07-13 ENCOUNTER — Other Ambulatory Visit (HOSPITAL_COMMUNITY): Payer: Self-pay

## 2023-07-15 ENCOUNTER — Other Ambulatory Visit (HOSPITAL_COMMUNITY): Payer: Self-pay

## 2023-07-28 ENCOUNTER — Other Ambulatory Visit (HOSPITAL_COMMUNITY): Payer: Self-pay

## 2023-07-28 MED ORDER — ELIQUIS 5 MG PO TABS
5.0000 mg | ORAL_TABLET | Freq: Two times a day (BID) | ORAL | 3 refills | Status: AC
  Filled 2023-07-28 – 2023-07-30 (×2): qty 180, 90d supply, fill #0
  Filled 2023-10-26: qty 180, 90d supply, fill #1
  Filled 2024-01-28: qty 180, 90d supply, fill #2
  Filled 2024-05-09: qty 180, 90d supply, fill #3

## 2023-07-29 ENCOUNTER — Other Ambulatory Visit: Payer: Self-pay

## 2023-07-29 ENCOUNTER — Other Ambulatory Visit (HOSPITAL_COMMUNITY): Payer: Self-pay

## 2023-07-30 ENCOUNTER — Other Ambulatory Visit (HOSPITAL_COMMUNITY): Payer: Self-pay

## 2023-07-30 ENCOUNTER — Other Ambulatory Visit: Payer: Self-pay

## 2023-07-31 ENCOUNTER — Other Ambulatory Visit (HOSPITAL_COMMUNITY): Payer: Self-pay

## 2023-08-04 ENCOUNTER — Ambulatory Visit: Payer: Medicare HMO | Admitting: Vascular Surgery

## 2023-08-04 ENCOUNTER — Ambulatory Visit (HOSPITAL_COMMUNITY): Payer: Medicare HMO

## 2023-08-26 ENCOUNTER — Other Ambulatory Visit (HOSPITAL_COMMUNITY): Payer: Self-pay

## 2023-08-26 MED ORDER — FUROSEMIDE 20 MG PO TABS
40.0000 mg | ORAL_TABLET | Freq: Every day | ORAL | 3 refills | Status: DC
  Filled 2023-08-26: qty 200, 100d supply, fill #0

## 2023-10-26 ENCOUNTER — Other Ambulatory Visit (HOSPITAL_COMMUNITY): Payer: Self-pay

## 2023-10-26 MED ORDER — OXYBUTYNIN CHLORIDE ER 10 MG PO TB24
10.0000 mg | ORAL_TABLET | Freq: Every day | ORAL | 3 refills | Status: AC
  Filled 2023-10-26: qty 90, 90d supply, fill #0
  Filled 2024-01-28: qty 90, 90d supply, fill #1

## 2023-10-26 MED ORDER — AMIODARONE HCL 200 MG PO TABS
200.0000 mg | ORAL_TABLET | Freq: Every day | ORAL | 3 refills | Status: AC
  Filled 2023-10-26: qty 90, 90d supply, fill #0
  Filled 2024-01-28: qty 90, 90d supply, fill #1
  Filled 2024-05-09: qty 90, 90d supply, fill #2

## 2023-12-02 ENCOUNTER — Other Ambulatory Visit (HOSPITAL_COMMUNITY): Payer: Self-pay

## 2023-12-03 ENCOUNTER — Other Ambulatory Visit (HOSPITAL_COMMUNITY): Payer: Self-pay

## 2023-12-04 ENCOUNTER — Other Ambulatory Visit (HOSPITAL_COMMUNITY): Payer: Self-pay

## 2023-12-04 ENCOUNTER — Other Ambulatory Visit: Payer: Self-pay

## 2023-12-04 MED ORDER — PREGABALIN 150 MG PO CAPS
150.0000 mg | ORAL_CAPSULE | Freq: Two times a day (BID) | ORAL | 1 refills | Status: AC
  Filled 2023-12-04: qty 180, 90d supply, fill #0
  Filled 2024-03-07: qty 180, 90d supply, fill #1

## 2024-01-28 ENCOUNTER — Other Ambulatory Visit (HOSPITAL_COMMUNITY): Payer: Self-pay

## 2024-02-10 ENCOUNTER — Other Ambulatory Visit (HOSPITAL_COMMUNITY): Payer: Self-pay

## 2024-02-10 MED ORDER — FUROSEMIDE 20 MG PO TABS: 60.0000 mg | ORAL_TABLET | Freq: Every day | ORAL | 3 refills | Status: AC

## 2024-02-10 MED ORDER — FUROSEMIDE 20 MG PO TABS
60.0000 mg | ORAL_TABLET | Freq: Every day | ORAL | 3 refills | Status: AC
  Filled 2024-02-10: qty 270, 90d supply, fill #0
  Filled 2024-05-09: qty 270, 90d supply, fill #1

## 2024-03-07 ENCOUNTER — Other Ambulatory Visit: Payer: Self-pay

## 2024-03-07 ENCOUNTER — Other Ambulatory Visit (HOSPITAL_COMMUNITY): Payer: Self-pay

## 2024-03-09 ENCOUNTER — Other Ambulatory Visit (HOSPITAL_COMMUNITY): Payer: Self-pay

## 2024-03-09 ENCOUNTER — Other Ambulatory Visit: Payer: Self-pay

## 2024-03-09 MED ORDER — DULOXETINE HCL 60 MG PO CPEP
60.0000 mg | ORAL_CAPSULE | Freq: Every day | ORAL | 3 refills | Status: AC
  Filled 2024-03-09: qty 90, 90d supply, fill #0

## 2024-05-09 ENCOUNTER — Other Ambulatory Visit (HOSPITAL_COMMUNITY): Payer: Self-pay

## 2024-05-09 MED ORDER — OMEPRAZOLE 20 MG PO CPDR
20.0000 mg | DELAYED_RELEASE_CAPSULE | Freq: Two times a day (BID) | ORAL | 3 refills | Status: AC
  Filled 2024-05-09: qty 180, 90d supply, fill #0
